# Patient Record
Sex: Female | Born: 1945
Health system: Southern US, Community
[De-identification: ages and names within clinical notes are randomized; demographics above are authoritative.]

## PROBLEM LIST (undated history)

## (undated) DIAGNOSIS — E079 Disorder of thyroid, unspecified: Secondary | ICD-10-CM

## (undated) DIAGNOSIS — K219 Gastro-esophageal reflux disease without esophagitis: Secondary | ICD-10-CM

## (undated) DIAGNOSIS — J449 Chronic obstructive pulmonary disease, unspecified: Secondary | ICD-10-CM

## (undated) DIAGNOSIS — Z972 Presence of dental prosthetic device (complete) (partial): Secondary | ICD-10-CM

## (undated) DIAGNOSIS — F32A Depression, unspecified: Secondary | ICD-10-CM

## (undated) DIAGNOSIS — M810 Age-related osteoporosis without current pathological fracture: Secondary | ICD-10-CM

## (undated) DIAGNOSIS — G2581 Restless legs syndrome: Secondary | ICD-10-CM

## (undated) DIAGNOSIS — J189 Pneumonia, unspecified organism: Secondary | ICD-10-CM

## (undated) DIAGNOSIS — F329 Major depressive disorder, single episode, unspecified: Secondary | ICD-10-CM

## (undated) DIAGNOSIS — M51369 Other intervertebral disc degeneration, lumbar region without mention of lumbar back pain or lower extremity pain: Secondary | ICD-10-CM

## (undated) DIAGNOSIS — T7840XA Allergy, unspecified, initial encounter: Secondary | ICD-10-CM

## (undated) DIAGNOSIS — B029 Zoster without complications: Secondary | ICD-10-CM

## (undated) DIAGNOSIS — Z8489 Family history of other specified conditions: Secondary | ICD-10-CM

## (undated) DIAGNOSIS — Z974 Presence of external hearing-aid: Secondary | ICD-10-CM

## (undated) DIAGNOSIS — R011 Cardiac murmur, unspecified: Secondary | ICD-10-CM

## (undated) DIAGNOSIS — M549 Dorsalgia, unspecified: Secondary | ICD-10-CM

## (undated) DIAGNOSIS — Z8379 Family history of other diseases of the digestive system: Secondary | ICD-10-CM

## (undated) DIAGNOSIS — R519 Headache, unspecified: Secondary | ICD-10-CM

## (undated) DIAGNOSIS — M419 Scoliosis, unspecified: Secondary | ICD-10-CM

## (undated) DIAGNOSIS — F419 Anxiety disorder, unspecified: Secondary | ICD-10-CM

## (undated) DIAGNOSIS — J40 Bronchitis, not specified as acute or chronic: Secondary | ICD-10-CM

## (undated) DIAGNOSIS — M436 Torticollis: Secondary | ICD-10-CM

## (undated) DIAGNOSIS — M5136 Other intervertebral disc degeneration, lumbar region: Secondary | ICD-10-CM

## (undated) DIAGNOSIS — Z90710 Acquired absence of both cervix and uterus: Secondary | ICD-10-CM

## (undated) DIAGNOSIS — I10 Essential (primary) hypertension: Secondary | ICD-10-CM

## (undated) DIAGNOSIS — J45909 Unspecified asthma, uncomplicated: Secondary | ICD-10-CM

## (undated) DIAGNOSIS — R51 Headache: Secondary | ICD-10-CM

## (undated) DIAGNOSIS — Z993 Dependence on wheelchair: Secondary | ICD-10-CM

## (undated) HISTORY — PX: BACK SURGERY: SHX140

## (undated) HISTORY — DX: Bronchitis, not specified as acute or chronic: J40

## (undated) HISTORY — DX: Dorsalgia, unspecified: M54.9

## (undated) HISTORY — PX: APPENDECTOMY: SHX54

## (undated) HISTORY — PX: CHOLECYSTECTOMY: SHX55

## (undated) HISTORY — DX: Cardiac murmur, unspecified: R01.1

## (undated) HISTORY — DX: Family history of other diseases of the digestive system: Z83.79

## (undated) HISTORY — DX: Acquired absence of both cervix and uterus: Z90.710

## (undated) HISTORY — PX: SHOULDER SURGERY: SHX246

## (undated) HISTORY — DX: Allergy, unspecified, initial encounter: T78.40XA

## (undated) HISTORY — DX: Essential (primary) hypertension: I10

## (undated) HISTORY — DX: Age-related osteoporosis without current pathological fracture: M81.0

## (undated) HISTORY — DX: Scoliosis, unspecified: M41.9

## (undated) HISTORY — DX: Disorder of thyroid, unspecified: E07.9

## (undated) HISTORY — PX: ROTATOR CUFF REPAIR: SHX139

## (undated) HISTORY — PX: NECK SURGERY: SHX720

## (undated) HISTORY — PX: ABDOMINAL HYSTERECTOMY: SHX81

## (undated) HISTORY — DX: Unspecified asthma, uncomplicated: J45.909

## (undated) HISTORY — DX: Other intervertebral disc degeneration, lumbar region without mention of lumbar back pain or lower extremity pain: M51.369

## (undated) HISTORY — DX: Pneumonia, unspecified organism: J18.9

## (undated) HISTORY — DX: Zoster without complications: B02.9

## (undated) HISTORY — DX: Other intervertebral disc degeneration, lumbar region: M51.36

## (undated) HISTORY — DX: Gastro-esophageal reflux disease without esophagitis: K21.9

## (undated) HISTORY — PX: SHOULDER OPEN ROTATOR CUFF REPAIR: SHX2407

## (undated) HISTORY — DX: Chronic obstructive pulmonary disease, unspecified: J44.9

---

## 1996-10-30 HISTORY — PX: JOINT REPLACEMENT: SHX530

## 2002-09-30 ENCOUNTER — Encounter: Payer: Self-pay | Admitting: Neurosurgery

## 2002-09-30 ENCOUNTER — Encounter: Admission: RE | Admit: 2002-09-30 | Discharge: 2002-09-30 | Payer: Self-pay | Admitting: Neurosurgery

## 2004-08-11 ENCOUNTER — Ambulatory Visit: Payer: Self-pay | Admitting: Pain Medicine

## 2004-08-15 ENCOUNTER — Ambulatory Visit: Payer: Self-pay | Admitting: Pain Medicine

## 2004-09-06 ENCOUNTER — Ambulatory Visit: Payer: Self-pay | Admitting: Pain Medicine

## 2004-09-14 ENCOUNTER — Ambulatory Visit: Payer: Self-pay | Admitting: Pain Medicine

## 2004-10-04 ENCOUNTER — Ambulatory Visit: Payer: Self-pay | Admitting: Pain Medicine

## 2004-10-12 ENCOUNTER — Ambulatory Visit: Payer: Self-pay | Admitting: Pain Medicine

## 2004-11-03 ENCOUNTER — Ambulatory Visit: Payer: Self-pay | Admitting: Pain Medicine

## 2004-11-07 ENCOUNTER — Ambulatory Visit: Payer: Self-pay | Admitting: Pain Medicine

## 2004-11-29 ENCOUNTER — Ambulatory Visit: Payer: Self-pay | Admitting: Pain Medicine

## 2004-12-07 ENCOUNTER — Ambulatory Visit: Payer: Self-pay | Admitting: Pain Medicine

## 2004-12-12 ENCOUNTER — Ambulatory Visit: Payer: Self-pay | Admitting: Pain Medicine

## 2004-12-20 ENCOUNTER — Ambulatory Visit: Payer: Self-pay | Admitting: Family Medicine

## 2004-12-22 ENCOUNTER — Ambulatory Visit: Payer: Self-pay | Admitting: Pain Medicine

## 2004-12-28 ENCOUNTER — Ambulatory Visit: Payer: Self-pay | Admitting: Pain Medicine

## 2005-01-01 ENCOUNTER — Emergency Department: Payer: Self-pay | Admitting: Internal Medicine

## 2005-01-12 ENCOUNTER — Inpatient Hospital Stay (HOSPITAL_COMMUNITY): Admission: RE | Admit: 2005-01-12 | Discharge: 2005-01-13 | Payer: Self-pay | Admitting: Gynecology

## 2005-02-21 ENCOUNTER — Ambulatory Visit: Payer: Self-pay | Admitting: Pain Medicine

## 2005-02-21 ENCOUNTER — Emergency Department: Payer: Self-pay | Admitting: Emergency Medicine

## 2005-02-24 ENCOUNTER — Ambulatory Visit: Payer: Self-pay | Admitting: Physician Assistant

## 2005-03-08 ENCOUNTER — Ambulatory Visit: Payer: Self-pay | Admitting: Pain Medicine

## 2005-03-08 ENCOUNTER — Other Ambulatory Visit: Payer: Self-pay

## 2005-03-10 ENCOUNTER — Ambulatory Visit: Payer: Self-pay | Admitting: Unknown Physician Specialty

## 2005-03-21 ENCOUNTER — Ambulatory Visit: Payer: Self-pay | Admitting: Pain Medicine

## 2005-03-28 ENCOUNTER — Encounter: Payer: Self-pay | Admitting: Unknown Physician Specialty

## 2005-03-30 ENCOUNTER — Encounter: Payer: Self-pay | Admitting: Unknown Physician Specialty

## 2005-04-18 ENCOUNTER — Ambulatory Visit: Payer: Self-pay | Admitting: Pain Medicine

## 2005-04-24 ENCOUNTER — Ambulatory Visit: Payer: Self-pay | Admitting: Pain Medicine

## 2005-04-29 ENCOUNTER — Encounter: Payer: Self-pay | Admitting: Unknown Physician Specialty

## 2005-10-17 ENCOUNTER — Ambulatory Visit: Payer: Self-pay | Admitting: Family Medicine

## 2006-02-28 ENCOUNTER — Other Ambulatory Visit: Payer: Self-pay

## 2006-03-05 ENCOUNTER — Ambulatory Visit: Payer: Self-pay | Admitting: General Surgery

## 2006-07-11 ENCOUNTER — Emergency Department: Payer: Self-pay | Admitting: Emergency Medicine

## 2006-08-27 ENCOUNTER — Emergency Department: Payer: Self-pay | Admitting: Emergency Medicine

## 2006-11-04 ENCOUNTER — Emergency Department: Payer: Self-pay | Admitting: General Practice

## 2006-12-11 ENCOUNTER — Emergency Department: Payer: Self-pay | Admitting: Internal Medicine

## 2006-12-25 ENCOUNTER — Other Ambulatory Visit: Payer: Self-pay

## 2006-12-25 ENCOUNTER — Inpatient Hospital Stay: Payer: Self-pay | Admitting: Internal Medicine

## 2006-12-26 ENCOUNTER — Other Ambulatory Visit: Payer: Self-pay

## 2007-04-25 ENCOUNTER — Emergency Department: Payer: Self-pay | Admitting: Emergency Medicine

## 2007-05-15 ENCOUNTER — Ambulatory Visit: Payer: Self-pay | Admitting: Physician Assistant

## 2007-06-05 ENCOUNTER — Emergency Department: Payer: Self-pay | Admitting: Emergency Medicine

## 2007-07-23 ENCOUNTER — Encounter: Payer: Self-pay | Admitting: Internal Medicine

## 2007-07-31 ENCOUNTER — Encounter: Payer: Self-pay | Admitting: Internal Medicine

## 2007-07-31 ENCOUNTER — Ambulatory Visit: Payer: Self-pay | Admitting: Pain Medicine

## 2007-08-14 ENCOUNTER — Ambulatory Visit: Payer: Self-pay | Admitting: Pain Medicine

## 2007-08-31 ENCOUNTER — Encounter: Payer: Self-pay | Admitting: Internal Medicine

## 2007-09-12 ENCOUNTER — Ambulatory Visit: Payer: Self-pay | Admitting: Pain Medicine

## 2007-09-18 ENCOUNTER — Ambulatory Visit: Payer: Self-pay | Admitting: Pain Medicine

## 2007-10-10 ENCOUNTER — Ambulatory Visit: Payer: Self-pay | Admitting: Pain Medicine

## 2007-10-15 ENCOUNTER — Ambulatory Visit: Payer: Self-pay | Admitting: Unknown Physician Specialty

## 2007-10-21 ENCOUNTER — Ambulatory Visit: Payer: Self-pay | Admitting: Pain Medicine

## 2007-11-06 ENCOUNTER — Ambulatory Visit: Payer: Self-pay | Admitting: Pain Medicine

## 2007-11-20 ENCOUNTER — Ambulatory Visit: Payer: Self-pay | Admitting: Pain Medicine

## 2007-11-21 ENCOUNTER — Other Ambulatory Visit: Payer: Self-pay

## 2007-11-21 ENCOUNTER — Ambulatory Visit: Payer: Self-pay | Admitting: Unknown Physician Specialty

## 2007-11-28 ENCOUNTER — Ambulatory Visit: Payer: Self-pay | Admitting: Unknown Physician Specialty

## 2007-12-26 ENCOUNTER — Ambulatory Visit: Payer: Self-pay | Admitting: Pain Medicine

## 2008-01-01 ENCOUNTER — Ambulatory Visit: Payer: Self-pay | Admitting: Pain Medicine

## 2008-01-21 ENCOUNTER — Ambulatory Visit: Payer: Self-pay | Admitting: Pain Medicine

## 2008-01-27 ENCOUNTER — Ambulatory Visit: Payer: Self-pay | Admitting: Pain Medicine

## 2008-03-12 ENCOUNTER — Ambulatory Visit: Payer: Self-pay | Admitting: Pain Medicine

## 2008-03-18 ENCOUNTER — Ambulatory Visit: Payer: Self-pay | Admitting: Pain Medicine

## 2008-04-21 ENCOUNTER — Ambulatory Visit: Payer: Self-pay | Admitting: Pain Medicine

## 2008-04-22 ENCOUNTER — Ambulatory Visit: Payer: Self-pay | Admitting: Internal Medicine

## 2008-04-27 ENCOUNTER — Ambulatory Visit: Payer: Self-pay | Admitting: Pain Medicine

## 2008-05-28 ENCOUNTER — Ambulatory Visit: Payer: Self-pay | Admitting: Pain Medicine

## 2008-06-01 ENCOUNTER — Ambulatory Visit: Payer: Self-pay | Admitting: Pain Medicine

## 2008-06-23 ENCOUNTER — Ambulatory Visit: Payer: Self-pay | Admitting: Pain Medicine

## 2008-07-01 ENCOUNTER — Ambulatory Visit: Payer: Self-pay | Admitting: Pain Medicine

## 2008-07-09 ENCOUNTER — Emergency Department: Payer: Self-pay | Admitting: Emergency Medicine

## 2008-07-13 ENCOUNTER — Ambulatory Visit: Payer: Self-pay | Admitting: Pain Medicine

## 2008-07-28 ENCOUNTER — Ambulatory Visit: Payer: Self-pay | Admitting: Pain Medicine

## 2008-08-03 ENCOUNTER — Ambulatory Visit: Payer: Self-pay | Admitting: Pain Medicine

## 2008-08-27 ENCOUNTER — Ambulatory Visit: Payer: Self-pay | Admitting: Pain Medicine

## 2008-09-02 ENCOUNTER — Ambulatory Visit: Payer: Self-pay | Admitting: Pain Medicine

## 2008-10-15 ENCOUNTER — Ambulatory Visit: Payer: Self-pay | Admitting: Pain Medicine

## 2008-11-11 ENCOUNTER — Ambulatory Visit: Payer: Self-pay | Admitting: Pain Medicine

## 2008-12-15 ENCOUNTER — Ambulatory Visit: Payer: Self-pay | Admitting: Pain Medicine

## 2008-12-21 ENCOUNTER — Ambulatory Visit: Payer: Self-pay | Admitting: Pain Medicine

## 2009-01-14 ENCOUNTER — Ambulatory Visit: Payer: Self-pay | Admitting: Pain Medicine

## 2009-01-20 ENCOUNTER — Ambulatory Visit: Payer: Self-pay | Admitting: Pain Medicine

## 2009-02-09 ENCOUNTER — Ambulatory Visit: Payer: Self-pay | Admitting: Pain Medicine

## 2009-02-10 ENCOUNTER — Ambulatory Visit: Payer: Self-pay | Admitting: Unknown Physician Specialty

## 2009-02-22 ENCOUNTER — Ambulatory Visit: Payer: Self-pay | Admitting: Pain Medicine

## 2009-03-03 ENCOUNTER — Ambulatory Visit: Payer: Self-pay | Admitting: Pain Medicine

## 2009-03-17 ENCOUNTER — Ambulatory Visit: Payer: Self-pay | Admitting: Pain Medicine

## 2009-03-29 ENCOUNTER — Inpatient Hospital Stay: Payer: Self-pay | Admitting: Internal Medicine

## 2009-04-06 ENCOUNTER — Ambulatory Visit: Payer: Self-pay | Admitting: Pain Medicine

## 2009-04-26 ENCOUNTER — Ambulatory Visit: Payer: Self-pay | Admitting: Pain Medicine

## 2009-05-02 ENCOUNTER — Emergency Department: Payer: Self-pay | Admitting: Emergency Medicine

## 2009-06-01 ENCOUNTER — Ambulatory Visit: Payer: Self-pay | Admitting: Pain Medicine

## 2009-06-16 ENCOUNTER — Ambulatory Visit: Payer: Self-pay | Admitting: Pain Medicine

## 2009-07-01 ENCOUNTER — Ambulatory Visit: Payer: Self-pay | Admitting: Pain Medicine

## 2009-08-23 ENCOUNTER — Ambulatory Visit: Payer: Self-pay

## 2009-08-31 ENCOUNTER — Ambulatory Visit: Payer: Self-pay | Admitting: Pain Medicine

## 2009-09-06 ENCOUNTER — Ambulatory Visit: Payer: Self-pay | Admitting: Pain Medicine

## 2009-09-21 ENCOUNTER — Ambulatory Visit: Payer: Self-pay | Admitting: Unknown Physician Specialty

## 2009-09-28 ENCOUNTER — Inpatient Hospital Stay: Payer: Self-pay | Admitting: Unknown Physician Specialty

## 2009-12-07 ENCOUNTER — Ambulatory Visit: Payer: Self-pay | Admitting: Pain Medicine

## 2010-08-07 ENCOUNTER — Inpatient Hospital Stay: Payer: Self-pay | Admitting: Internal Medicine

## 2011-03-18 ENCOUNTER — Emergency Department: Payer: Self-pay | Admitting: Unknown Physician Specialty

## 2011-05-20 ENCOUNTER — Emergency Department: Payer: Self-pay | Admitting: Emergency Medicine

## 2011-05-29 ENCOUNTER — Emergency Department: Payer: Self-pay | Admitting: Unknown Physician Specialty

## 2012-01-07 ENCOUNTER — Emergency Department: Payer: Self-pay | Admitting: Emergency Medicine

## 2012-01-07 LAB — MAGNESIUM: Magnesium: 2 mg/dL

## 2012-01-07 LAB — COMPREHENSIVE METABOLIC PANEL
Albumin: 3.9 g/dL (ref 3.4–5.0)
Alkaline Phosphatase: 84 U/L (ref 50–136)
Anion Gap: 8 (ref 7–16)
BUN: 11 mg/dL (ref 7–18)
Bilirubin,Total: 0.4 mg/dL (ref 0.2–1.0)
Calcium, Total: 9.5 mg/dL (ref 8.5–10.1)
Chloride: 100 mmol/L (ref 98–107)
Co2: 29 mmol/L (ref 21–32)
Creatinine: 0.79 mg/dL (ref 0.60–1.30)
EGFR (African American): >60
EGFR (Non-African Amer.): >60
Glucose: 97 mg/dL (ref 65–99)
Osmolality: 273 (ref 275–301)
Potassium: 3.2 mmol/L — ABNORMAL LOW (ref 3.5–5.1)
SGOT(AST): 38 U/L — ABNORMAL HIGH (ref 15–37)
SGPT (ALT): 40 U/L
Sodium: 137 mmol/L (ref 136–145)
Total Protein: 7.7 g/dL (ref 6.4–8.2)

## 2012-01-11 ENCOUNTER — Ambulatory Visit: Payer: Self-pay | Admitting: Pain Medicine

## 2012-01-12 ENCOUNTER — Ambulatory Visit: Payer: Self-pay | Admitting: Internal Medicine

## 2012-01-24 ENCOUNTER — Ambulatory Visit: Payer: Self-pay | Admitting: Pain Medicine

## 2012-02-29 ENCOUNTER — Ambulatory Visit: Payer: Self-pay | Admitting: Pain Medicine

## 2012-03-11 ENCOUNTER — Ambulatory Visit: Payer: Self-pay | Admitting: Pain Medicine

## 2012-05-07 ENCOUNTER — Ambulatory Visit: Payer: Self-pay | Admitting: Orthopedic Surgery

## 2012-06-24 ENCOUNTER — Ambulatory Visit: Payer: Self-pay | Admitting: Neurology

## 2012-07-04 ENCOUNTER — Emergency Department: Payer: Self-pay | Admitting: Emergency Medicine

## 2012-07-04 ENCOUNTER — Inpatient Hospital Stay: Payer: Self-pay | Admitting: Internal Medicine

## 2012-07-04 LAB — CBC WITH DIFFERENTIAL/PLATELET
Basophil #: 0.1 10*3/uL (ref 0.0–0.1)
Basophil %: 0.6 %
Eosinophil #: 0.1 10*3/uL (ref 0.0–0.7)
Eosinophil %: 0.9 %
HCT: 45.9 % (ref 35.0–47.0)
HGB: 15.4 g/dL (ref 12.0–16.0)
Lymphocyte #: 2.6 10*3/uL (ref 1.0–3.6)
Lymphocyte %: 19.4 %
MCH: 30.8 pg (ref 26.0–34.0)
MCHC: 33.5 g/dL (ref 32.0–36.0)
MCV: 92 fL (ref 80–100)
Monocyte #: 1.2 x10 3/mm — ABNORMAL HIGH (ref 0.2–0.9)
Monocyte %: 8.9 %
Neutrophil #: 9.3 10*3/uL — ABNORMAL HIGH (ref 1.4–6.5)
Neutrophil %: 70.2 %
Platelet: 195 10*3/uL (ref 150–440)
RBC: 5 10*6/uL (ref 3.80–5.20)
RDW: 14.2 % (ref 11.5–14.5)
WBC: 13.3 10*3/uL — ABNORMAL HIGH (ref 3.6–11.0)

## 2012-07-04 LAB — TROPONIN I: Troponin-I: <0.02 ng/mL

## 2012-07-04 LAB — URINALYSIS, COMPLETE
Bacteria: NONE SEEN
Bilirubin,UR: NEGATIVE
Glucose,UR: NEGATIVE mg/dL (ref 0–75)
Ketone: NEGATIVE
Leukocyte Esterase: NEGATIVE
Nitrite: NEGATIVE
Ph: 6 (ref 4.5–8.0)
Protein: NEGATIVE
RBC,UR: 1 /HPF (ref 0–5)
Specific Gravity: 1.004 (ref 1.003–1.030)
Squamous Epithelial: 1
WBC UR: <1 /HPF (ref 0–5)

## 2012-07-04 LAB — BASIC METABOLIC PANEL
Anion Gap: 5 — ABNORMAL LOW (ref 7–16)
BUN: 10 mg/dL (ref 7–18)
Calcium, Total: 9.1 mg/dL (ref 8.5–10.1)
Chloride: 104 mmol/L (ref 98–107)
Co2: 26 mmol/L (ref 21–32)
Creatinine: 0.62 mg/dL (ref 0.60–1.30)
EGFR (African American): >60
EGFR (Non-African Amer.): >60
Glucose: 81 mg/dL (ref 65–99)
Osmolality: 268 (ref 275–301)
Potassium: 3.8 mmol/L (ref 3.5–5.1)
Sodium: 135 mmol/L — ABNORMAL LOW (ref 136–145)

## 2012-07-04 LAB — CK TOTAL AND CKMB (NOT AT ARMC)
CK, Total: 107 U/L (ref 21–215)
CK-MB: 2 ng/mL (ref 0.5–3.6)

## 2012-07-08 LAB — CREATININE, SERUM
Creatinine: 0.79 mg/dL (ref 0.60–1.30)
EGFR (African American): >60
EGFR (Non-African Amer.): >60

## 2012-07-16 ENCOUNTER — Ambulatory Visit: Payer: Self-pay | Admitting: Pain Medicine

## 2012-07-17 ENCOUNTER — Emergency Department: Payer: Self-pay | Admitting: Emergency Medicine

## 2012-07-17 LAB — BASIC METABOLIC PANEL
Anion Gap: 11 (ref 7–16)
BUN: 16 mg/dL (ref 7–18)
Calcium, Total: 9.4 mg/dL (ref 8.5–10.1)
Chloride: 100 mmol/L (ref 98–107)
Co2: 26 mmol/L (ref 21–32)
Creatinine: 1.03 mg/dL (ref 0.60–1.30)
EGFR (African American): >60
EGFR (Non-African Amer.): 57 — ABNORMAL LOW
Glucose: 197 mg/dL — ABNORMAL HIGH (ref 65–99)
Osmolality: 280 (ref 275–301)
Potassium: 3.7 mmol/L (ref 3.5–5.1)
Sodium: 137 mmol/L (ref 136–145)

## 2012-07-17 LAB — CK TOTAL AND CKMB (NOT AT ARMC)
CK, Total: 43 U/L (ref 21–215)
CK-MB: 1.1 ng/mL (ref 0.5–3.6)

## 2012-07-17 LAB — CBC
HCT: 43.6 % (ref 35.0–47.0)
HGB: 14.9 g/dL (ref 12.0–16.0)
MCH: 31 pg (ref 26.0–34.0)
MCHC: 34.1 g/dL (ref 32.0–36.0)
MCV: 91 fL (ref 80–100)
Platelet: 323 10*3/uL (ref 150–440)
RBC: 4.8 10*6/uL (ref 3.80–5.20)
RDW: 14.3 % (ref 11.5–14.5)
WBC: 17.8 10*3/uL — ABNORMAL HIGH (ref 3.6–11.0)

## 2012-07-17 LAB — TROPONIN I
Troponin-I: <0.02 ng/mL
Troponin-I: <0.02 ng/mL

## 2012-07-18 ENCOUNTER — Emergency Department: Payer: Self-pay | Admitting: Emergency Medicine

## 2012-07-29 ENCOUNTER — Ambulatory Visit: Payer: Self-pay | Admitting: Pain Medicine

## 2012-08-22 ENCOUNTER — Ambulatory Visit: Payer: Self-pay | Admitting: Otolaryngology

## 2012-10-15 ENCOUNTER — Ambulatory Visit: Payer: Self-pay | Admitting: Pain Medicine

## 2012-10-28 ENCOUNTER — Ambulatory Visit: Payer: Self-pay | Admitting: Pain Medicine

## 2012-11-21 ENCOUNTER — Ambulatory Visit: Payer: Self-pay | Admitting: Pain Medicine

## 2012-12-04 ENCOUNTER — Ambulatory Visit: Payer: Self-pay | Admitting: Pain Medicine

## 2012-12-24 ENCOUNTER — Ambulatory Visit: Payer: Self-pay | Admitting: Pain Medicine

## 2013-01-01 ENCOUNTER — Ambulatory Visit: Payer: Self-pay | Admitting: Pain Medicine

## 2013-01-14 ENCOUNTER — Ambulatory Visit: Payer: Self-pay | Admitting: Internal Medicine

## 2013-01-30 ENCOUNTER — Ambulatory Visit: Payer: Self-pay | Admitting: Pain Medicine

## 2013-02-12 ENCOUNTER — Ambulatory Visit: Payer: Self-pay | Admitting: Pain Medicine

## 2013-02-25 ENCOUNTER — Ambulatory Visit: Payer: Self-pay | Admitting: Pain Medicine

## 2013-02-26 LAB — BASIC METABOLIC PANEL
Anion Gap: 4 — ABNORMAL LOW (ref 7–16)
BUN: 7 mg/dL (ref 7–18)
Calcium, Total: 9.1 mg/dL (ref 8.5–10.1)
Chloride: 101 mmol/L (ref 98–107)
Co2: 30 mmol/L (ref 21–32)
Creatinine: 0.64 mg/dL (ref 0.60–1.30)
EGFR (African American): >60
EGFR (Non-African Amer.): >60
Glucose: 136 mg/dL — ABNORMAL HIGH (ref 65–99)
Osmolality: 270 (ref 275–301)
Potassium: 3.6 mmol/L (ref 3.5–5.1)
Sodium: 135 mmol/L — ABNORMAL LOW (ref 136–145)

## 2013-02-26 LAB — CBC
HCT: 40.9 % (ref 35.0–47.0)
HGB: 13.8 g/dL (ref 12.0–16.0)
MCH: 29.9 pg (ref 26.0–34.0)
MCHC: 33.8 g/dL (ref 32.0–36.0)
MCV: 89 fL (ref 80–100)
Platelet: 259 10*3/uL (ref 150–440)
RBC: 4.62 10*6/uL (ref 3.80–5.20)
RDW: 14.3 % (ref 11.5–14.5)
WBC: 17.2 10*3/uL — ABNORMAL HIGH (ref 3.6–11.0)

## 2013-02-27 ENCOUNTER — Inpatient Hospital Stay: Payer: Self-pay | Admitting: Family Medicine

## 2013-02-27 LAB — CBC WITH DIFFERENTIAL/PLATELET
Basophil #: 0.1 10*3/uL (ref 0.0–0.1)
Basophil %: 0.3 %
Eosinophil #: 0 10*3/uL (ref 0.0–0.7)
Eosinophil %: 0 %
HCT: 39.8 % (ref 35.0–47.0)
HGB: 13.5 g/dL (ref 12.0–16.0)
Lymphocyte #: 0.6 10*3/uL — ABNORMAL LOW (ref 1.0–3.6)
Lymphocyte %: 4.2 %
MCH: 30 pg (ref 26.0–34.0)
MCHC: 33.8 g/dL (ref 32.0–36.0)
MCV: 89 fL (ref 80–100)
Monocyte #: 0.1 x10 3/mm — ABNORMAL LOW (ref 0.2–0.9)
Monocyte %: 0.6 %
Neutrophil #: 14.7 10*3/uL — ABNORMAL HIGH (ref 1.4–6.5)
Neutrophil %: 94.9 %
Platelet: 251 10*3/uL (ref 150–440)
RBC: 4.48 10*6/uL (ref 3.80–5.20)
RDW: 14.4 % (ref 11.5–14.5)
WBC: 15.5 10*3/uL — ABNORMAL HIGH (ref 3.6–11.0)

## 2013-02-27 LAB — BASIC METABOLIC PANEL
Anion Gap: 10 (ref 7–16)
BUN: 8 mg/dL (ref 7–18)
Calcium, Total: 8.8 mg/dL (ref 8.5–10.1)
Chloride: 97 mmol/L — ABNORMAL LOW (ref 98–107)
Co2: 26 mmol/L (ref 21–32)
Creatinine: 1.05 mg/dL (ref 0.60–1.30)
EGFR (African American): >60
EGFR (Non-African Amer.): 56 — ABNORMAL LOW
Glucose: 204 mg/dL — ABNORMAL HIGH (ref 65–99)
Osmolality: 271 (ref 275–301)
Potassium: 3.5 mmol/L (ref 3.5–5.1)
Sodium: 133 mmol/L — ABNORMAL LOW (ref 136–145)

## 2013-03-02 LAB — CREATININE, SERUM
Creatinine: 0.68 mg/dL (ref 0.60–1.30)
EGFR (African American): >60
EGFR (Non-African Amer.): >60

## 2013-03-02 LAB — CBC WITH DIFFERENTIAL/PLATELET
Basophil #: 0 10*3/uL (ref 0.0–0.1)
Basophil %: 0.2 %
Eosinophil #: 0 10*3/uL (ref 0.0–0.7)
Eosinophil %: 0 %
HCT: 37.1 % (ref 35.0–47.0)
HGB: 12.6 g/dL (ref 12.0–16.0)
Lymphocyte #: 1.6 10*3/uL (ref 1.0–3.6)
Lymphocyte %: 10.7 %
MCH: 29.8 pg (ref 26.0–34.0)
MCHC: 34.1 g/dL (ref 32.0–36.0)
MCV: 88 fL (ref 80–100)
Monocyte #: 1.2 x10 3/mm — ABNORMAL HIGH (ref 0.2–0.9)
Monocyte %: 7.9 %
Neutrophil #: 12 10*3/uL — ABNORMAL HIGH (ref 1.4–6.5)
Neutrophil %: 81.2 %
Platelet: 224 10*3/uL (ref 150–440)
RBC: 4.24 10*6/uL (ref 3.80–5.20)
RDW: 14.1 % (ref 11.5–14.5)
WBC: 14.7 10*3/uL — ABNORMAL HIGH (ref 3.6–11.0)

## 2013-03-04 LAB — CULTURE, BLOOD (SINGLE)

## 2013-03-10 ENCOUNTER — Ambulatory Visit: Payer: Self-pay | Admitting: Pain Medicine

## 2013-04-23 ENCOUNTER — Ambulatory Visit: Payer: Self-pay | Admitting: Pain Medicine

## 2013-04-30 ENCOUNTER — Ambulatory Visit: Payer: Self-pay | Admitting: Pain Medicine

## 2013-05-22 ENCOUNTER — Ambulatory Visit: Payer: Self-pay | Admitting: Pain Medicine

## 2013-06-09 ENCOUNTER — Ambulatory Visit: Payer: Self-pay | Admitting: Pain Medicine

## 2013-06-25 ENCOUNTER — Ambulatory Visit: Payer: Self-pay | Admitting: Pain Medicine

## 2013-07-07 ENCOUNTER — Ambulatory Visit: Payer: Self-pay | Admitting: Pain Medicine

## 2013-07-24 ENCOUNTER — Ambulatory Visit: Payer: Self-pay | Admitting: Pain Medicine

## 2013-08-21 ENCOUNTER — Ambulatory Visit: Payer: Self-pay | Admitting: Pain Medicine

## 2013-09-01 ENCOUNTER — Ambulatory Visit: Payer: Self-pay | Admitting: Pain Medicine

## 2013-09-22 ENCOUNTER — Emergency Department: Payer: Self-pay | Admitting: Emergency Medicine

## 2013-10-07 ENCOUNTER — Ambulatory Visit: Payer: Self-pay | Admitting: Pain Medicine

## 2013-10-13 ENCOUNTER — Ambulatory Visit: Payer: Self-pay | Admitting: Pain Medicine

## 2013-10-28 ENCOUNTER — Ambulatory Visit: Payer: Self-pay | Admitting: Pain Medicine

## 2013-11-24 ENCOUNTER — Ambulatory Visit: Payer: Self-pay | Admitting: Pain Medicine

## 2013-12-30 ENCOUNTER — Ambulatory Visit: Payer: Self-pay | Admitting: Pain Medicine

## 2014-01-28 ENCOUNTER — Ambulatory Visit: Payer: Self-pay | Admitting: Pain Medicine

## 2014-02-09 ENCOUNTER — Ambulatory Visit: Payer: Self-pay

## 2014-02-09 LAB — CBC WITH DIFFERENTIAL/PLATELET
Basophil #: 0.1 10*3/uL (ref 0.0–0.1)
Basophil %: 1.1 %
Eosinophil #: 0.2 10*3/uL (ref 0.0–0.7)
Eosinophil %: 2.1 %
HCT: 43.3 % (ref 35.0–47.0)
HGB: 14.1 g/dL (ref 12.0–16.0)
Lymphocyte #: 2 10*3/uL (ref 1.0–3.6)
Lymphocyte %: 20.9 %
MCH: 28.9 pg (ref 26.0–34.0)
MCHC: 32.5 g/dL (ref 32.0–36.0)
MCV: 89 fL (ref 80–100)
Monocyte #: 0.7 x10 3/mm (ref 0.2–0.9)
Monocyte %: 7.3 %
Neutrophil #: 6.4 10*3/uL (ref 1.4–6.5)
Neutrophil %: 68.6 %
Platelet: 253 10*3/uL (ref 150–440)
RBC: 4.88 10*6/uL (ref 3.80–5.20)
RDW: 15 % — ABNORMAL HIGH (ref 11.5–14.5)
WBC: 9.4 10*3/uL (ref 3.6–11.0)

## 2014-02-09 LAB — COMPREHENSIVE METABOLIC PANEL
Albumin: 3.6 g/dL (ref 3.4–5.0)
Alkaline Phosphatase: 106 U/L
Anion Gap: 9 (ref 7–16)
BUN: 13 mg/dL (ref 7–18)
Bilirubin,Total: 0.3 mg/dL (ref 0.2–1.0)
Calcium, Total: 9 mg/dL (ref 8.5–10.1)
Chloride: 100 mmol/L (ref 98–107)
Co2: 26 mmol/L (ref 21–32)
Creatinine: 0.75 mg/dL (ref 0.60–1.30)
EGFR (African American): >60
EGFR (Non-African Amer.): >60
Glucose: 81 mg/dL (ref 65–99)
Osmolality: 269 (ref 275–301)
Potassium: 4.1 mmol/L (ref 3.5–5.1)
SGOT(AST): 21 U/L (ref 15–37)
SGPT (ALT): 25 U/L (ref 12–78)
Sodium: 135 mmol/L — ABNORMAL LOW (ref 136–145)
Total Protein: 7.1 g/dL (ref 6.4–8.2)

## 2014-02-09 LAB — MAGNESIUM: Magnesium: 2.2 mg/dL

## 2014-02-26 ENCOUNTER — Ambulatory Visit: Payer: Self-pay | Admitting: Pain Medicine

## 2014-03-03 ENCOUNTER — Ambulatory Visit: Payer: Self-pay | Admitting: Internal Medicine

## 2014-03-05 ENCOUNTER — Emergency Department: Payer: Self-pay | Admitting: Emergency Medicine

## 2014-03-05 LAB — BASIC METABOLIC PANEL
Anion Gap: 2 — ABNORMAL LOW (ref 7–16)
BUN: 8 mg/dL (ref 7–18)
Calcium, Total: 9.1 mg/dL (ref 8.5–10.1)
Chloride: 101 mmol/L (ref 98–107)
Co2: 31 mmol/L (ref 21–32)
Creatinine: 0.68 mg/dL (ref 0.60–1.30)
EGFR (African American): >60
EGFR (Non-African Amer.): >60
Glucose: 94 mg/dL (ref 65–99)
Osmolality: 266 (ref 275–301)
Potassium: 4.9 mmol/L (ref 3.5–5.1)
Sodium: 134 mmol/L — ABNORMAL LOW (ref 136–145)

## 2014-03-05 LAB — CBC
HCT: 41.7 % (ref 35.0–47.0)
HGB: 13.8 g/dL (ref 12.0–16.0)
MCH: 29.4 pg (ref 26.0–34.0)
MCHC: 33.1 g/dL (ref 32.0–36.0)
MCV: 89 fL (ref 80–100)
Platelet: 259 10*3/uL (ref 150–440)
RBC: 4.7 10*6/uL (ref 3.80–5.20)
RDW: 15.4 % — ABNORMAL HIGH (ref 11.5–14.5)
WBC: 7 10*3/uL (ref 3.6–11.0)

## 2014-03-05 LAB — URINALYSIS, COMPLETE
Bacteria: NONE SEEN
Bilirubin,UR: NEGATIVE
Blood: NEGATIVE
Glucose,UR: NEGATIVE mg/dL (ref 0–75)
Hyaline Cast: 1
Ketone: NEGATIVE
Leukocyte Esterase: NEGATIVE
Nitrite: NEGATIVE
Ph: 6 (ref 4.5–8.0)
Protein: NEGATIVE
RBC,UR: NONE SEEN /HPF (ref 0–5)
Specific Gravity: 1.009 (ref 1.003–1.030)
Squamous Epithelial: NONE SEEN
WBC UR: NONE SEEN /HPF (ref 0–5)

## 2014-03-18 ENCOUNTER — Ambulatory Visit: Payer: Self-pay | Admitting: Pain Medicine

## 2014-03-25 ENCOUNTER — Ambulatory Visit: Payer: Self-pay | Admitting: Pain Medicine

## 2014-03-31 ENCOUNTER — Ambulatory Visit: Payer: Self-pay | Admitting: Pain Medicine

## 2014-04-20 ENCOUNTER — Ambulatory Visit: Payer: Self-pay | Admitting: Pain Medicine

## 2014-05-26 ENCOUNTER — Ambulatory Visit: Payer: Self-pay | Admitting: Pain Medicine

## 2014-06-03 ENCOUNTER — Ambulatory Visit: Payer: Self-pay | Admitting: Pain Medicine

## 2014-06-30 ENCOUNTER — Ambulatory Visit: Payer: Self-pay | Admitting: Pain Medicine

## 2014-07-13 ENCOUNTER — Ambulatory Visit: Payer: Self-pay | Admitting: Pain Medicine

## 2014-07-22 ENCOUNTER — Ambulatory Visit: Payer: Self-pay | Admitting: Pain Medicine

## 2014-08-07 ENCOUNTER — Emergency Department: Payer: Self-pay | Admitting: Internal Medicine

## 2014-08-17 ENCOUNTER — Ambulatory Visit: Payer: Self-pay | Admitting: Pain Medicine

## 2014-08-24 ENCOUNTER — Ambulatory Visit: Payer: Self-pay | Admitting: Pain Medicine

## 2014-08-26 DIAGNOSIS — J45909 Unspecified asthma, uncomplicated: Secondary | ICD-10-CM | POA: Insufficient documentation

## 2014-08-26 DIAGNOSIS — IMO0002 Reserved for concepts with insufficient information to code with codable children: Secondary | ICD-10-CM | POA: Insufficient documentation

## 2014-08-26 DIAGNOSIS — M810 Age-related osteoporosis without current pathological fracture: Secondary | ICD-10-CM | POA: Insufficient documentation

## 2014-08-26 DIAGNOSIS — M1990 Unspecified osteoarthritis, unspecified site: Secondary | ICD-10-CM | POA: Insufficient documentation

## 2014-08-26 DIAGNOSIS — E034 Atrophy of thyroid (acquired): Secondary | ICD-10-CM | POA: Insufficient documentation

## 2014-09-22 ENCOUNTER — Ambulatory Visit: Payer: Self-pay | Admitting: Pain Medicine

## 2014-09-30 ENCOUNTER — Ambulatory Visit: Payer: Self-pay | Admitting: Pain Medicine

## 2014-10-12 ENCOUNTER — Ambulatory Visit: Payer: Self-pay | Admitting: Pain Medicine

## 2014-10-28 ENCOUNTER — Ambulatory Visit: Payer: Self-pay | Admitting: Pain Medicine

## 2014-11-05 ENCOUNTER — Emergency Department: Payer: Self-pay | Admitting: Emergency Medicine

## 2014-11-05 LAB — COMPREHENSIVE METABOLIC PANEL
Albumin: 3.6 g/dL (ref 3.4–5.0)
Alkaline Phosphatase: 90 U/L
Anion Gap: 9 (ref 7–16)
BUN: 11 mg/dL (ref 7–18)
Bilirubin,Total: 0.6 mg/dL (ref 0.2–1.0)
Calcium, Total: 8.9 mg/dL (ref 8.5–10.1)
Chloride: 98 mmol/L (ref 98–107)
Co2: 24 mmol/L (ref 21–32)
Creatinine: 0.95 mg/dL (ref 0.60–1.30)
EGFR (African American): >60
EGFR (Non-African Amer.): >60
Glucose: 117 mg/dL — ABNORMAL HIGH (ref 65–99)
Osmolality: 263 (ref 275–301)
Potassium: 4 mmol/L (ref 3.5–5.1)
SGOT(AST): 18 U/L (ref 15–37)
SGPT (ALT): 25 U/L
Sodium: 131 mmol/L — ABNORMAL LOW (ref 136–145)
Total Protein: 6.8 g/dL (ref 6.4–8.2)

## 2014-11-05 LAB — CBC
HCT: 41 % (ref 35.0–47.0)
HGB: 12.9 g/dL (ref 12.0–16.0)
MCH: 27.8 pg (ref 26.0–34.0)
MCHC: 31.4 g/dL — ABNORMAL LOW (ref 32.0–36.0)
MCV: 88 fL (ref 80–100)
Platelet: 293 10*3/uL (ref 150–440)
RBC: 4.64 10*6/uL (ref 3.80–5.20)
RDW: 14.8 % — ABNORMAL HIGH (ref 11.5–14.5)
WBC: 14.5 10*3/uL — ABNORMAL HIGH (ref 3.6–11.0)

## 2014-11-05 LAB — TROPONIN I: Troponin-I: <0.02 ng/mL

## 2014-11-26 ENCOUNTER — Ambulatory Visit: Payer: Self-pay | Admitting: Pain Medicine

## 2014-12-07 ENCOUNTER — Ambulatory Visit: Payer: Self-pay | Admitting: Pain Medicine

## 2014-12-24 ENCOUNTER — Ambulatory Visit: Payer: Self-pay | Admitting: Pain Medicine

## 2014-12-30 ENCOUNTER — Ambulatory Visit: Payer: Self-pay | Admitting: Pain Medicine

## 2015-02-02 ENCOUNTER — Ambulatory Visit
Admit: 2015-02-02 | Disposition: A | Discharge status: Home / Self Care | Payer: Self-pay | Attending: Pain Medicine | Admitting: Pain Medicine

## 2015-02-15 ENCOUNTER — Ambulatory Visit
Admit: 2015-02-15 | Disposition: A | Discharge status: Home / Self Care | Payer: Self-pay | Attending: Pain Medicine | Admitting: Pain Medicine

## 2015-02-16 NOTE — Discharge Summary (Signed)
PATIENT NAME:  [name], [name] MR#:  [id number] DATE OF BIRTH:  10-30-1946  DATE OF ADMISSION:  07/04/2012 DATE OF DISCHARGE:  07/08/2012  HISTORY: Ms. Wahba is a 69 year old white lady with known chronic obstructive pulmonary disease who came in with a 10 day history of increasing shortness of breath and wheezing with a productive cough. She had no fever. The patient had been in the Emergency Room earlier in the day after a fall at home hitting her head. She did have a CT scan of the head at that time that was unremarkable. When she got home however she started having increasing shortness of breath and returned to the ER. Because of persistent bilateral wheezing, she was admitted for further evaluation.   PAST MEDICAL HISTORY:  1. Asthmatic chronic obstructive pulmonary disease with continued tobacco use.  2. Hypertension.  3. Hypothyroidism.  4. Chronic back pain, followed by the pain clinic.  5. Osteoarthritis.   PAST SURGICAL HISTORY:  1. Previous bladder repair.  2. Hysterectomy.  3. Cholecystectomy.  4. Appendectomy.  5. Cervical spine fusion. 6. Right rotator cuff repair.  7. Left shoulder replacement.   ADMISSION MEDICATIONS: 1. Albuterol 2 puffs every four hours p.r.n.  2. Fosamax 70 mg once a week. 3. Meloxicam 1 tablet daily.  4. Hydrochlorothiazide 25 mg daily.  5. Potassium chloride 20 mEq daily.  6. Levothyroxine 125 mcg daily.  7. Metoprolol 100 mg twice a day. 8. Paroxetine 40 mg daily.  9. Norco 5/325 mg one every six hours p.r.n.  10. Flovent 1 puff twice a day. 11. Calcium supplement.   ALLERGIES: No known drug allergies.   ADMISSION PHYSICAL EXAMINATION: As described by the admitting physician, examination revealed a blood pressure of 168/101, a respiratory rate of 22, a pulse of 100, and an oxygen saturation of 95%. The patient was noted to be slightly tachypneic but was not using the accessory muscles of respiration. She did have a prolonged expiratory phase  with bilateral expiratory wheezing. The remainder of the examination was basically unremarkable.   LABS/RADIOLOGIC STUDIES: The patient's admission CBC showed a hemoglobin of 15.4 with a hematocrit of 45.9. Platelet count was 195,000. White count was 13,300. Admission basic metabolic panel was notable only for a sodium of 135, which was of questionable clinical significance.   Admission urinalysis showed 1+ blood on the dipstick, but the microscopic was unremarkable.   Admission electrocardiogram showed a sinus tachycardia with aberrant conduction. There was left axis deviation. Premature atrial complexes were noted.   Admission chest x-ray showed no acute cardiopulmonary disease. A comminuted proximal right humeral fracture was an incidental finding. It was confirmed by routine radiographs of the right shoulder.   Head CT without contrast showed mild diffuse cerebral atrophy. An incidental finding was total opacification of the left maxillary sinus. It was noted to have progressed since 2008. The findings were confirmed by routine CT scan of the sinuses.   HOSPITAL COURSE: The patient was admitted to the regular medical floor where she was rehydrated with IV fluids. She was started on IV steroids, IV antibiotics, and SVNs. She was seen in consultation by [facility] who suggested continuing her antibiotics for at least two weeks after discharge for her chronic sinusitis. She was also seen by Dr. Rudene Christians in orthopedics who apparently had been seeing her as an outpatient for her shoulder fracture on the right. The patient showed slow but gradual improvement. She was eventually bridged to p.o. medications. She was ambulated without difficulty. At  the time of discharge, she still had a few scattered musical sounds, but no overt wheezing.   DISCHARGE DIAGNOSES:  1. Acute asthma flare.  2. Acute on chronic sinusitis.   DISCHARGE DISPOSITION: The patient was discharged on her routine preadmission  medications without change. She was placed on a Sterapred double strength 12 day taper. She was also placed on Levaquin 500 mg daily x2 weeks.   The patient was discharged on a low sodium diet with activity as tolerated.   She is to be followed up by Dr. Kathyrn Sheriff at [facility] in 1 to 2 weeks. She is to see me in followup in 3 to 4 weeks.  ____________________________ Hewitt Blade Sarina Ser, MD jbw:slb D: 07/21/2012 16:23:00 ET T: 07/22/2012 13:12:22 ET JOB#: [id number]  cc: Jenny Reichmann B. Sarina Ser, MD, <Dictator> Lottie Mussel III MD ELECTRONICALLY SIGNED 07/23/2012 8:02

## 2015-02-16 NOTE — Consult Note (Signed)
PATIENT NAMEMAKAELAH, [name] MR#:  [id number] DATE OF BIRTH:  02/22/1946  DATE OF CONSULTATION:  07/05/2012  REQUESTING PHYSICIAN:  Lisette Grinder, III, MD CONSULTING PHYSICIAN:  Laurice Record. Holley Bouche., MD  CHIEF COMPLAINT: Right shoulder fracture.   HISTORY OF PRESENT ILLNESS: The patient is a 69 year old female who tripped and fell on concrete on 01/31/2012 and sustained a right proximal humerus fracture. She has been treated conservatively with mobilization. She was last seen by Dr. 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 She has subsequently been treated with a bone stimulator. She has intermittently been using a sling for comfort. The patient was admitted to the hospital following a fall and acute exacerbation of her chronic obstructive pulmonary disease. The right proximal humerus fracture was noted on chest x-ray, and consultation was placed accordingly. She denied any new injury to the shoulder. She denied any new onset of pain. She has had some persistent right shoulder pain and limited activity.   PAST MEDICAL HISTORY:  1. Asthma.  2. Depression. 3. Chronic obstructive pulmonary disease.  4. Osteoporosis.  5. Hypothyroidism.  6. Hypertension. 7. A hernia. 8. Migraine headaches.   PAST SURGICAL HISTORY: Status post appendectomy, cholecystectomy, herniorrhaphy, vaginal surgery, partial hysterectomy, varicose vein surgery of the left leg, open reduction internal fixation of the right distal radius fracture (03/2007), open repair of right rotator cuff tear with distal clavicle resection (02/2005), anterior cervical fusion C4-C5, C5-6, C6-7 with insertion of interbody device at C4-5 and H8-2 and X9-3, application of anterior cervical plate from Z1-6 to R6-7 (10/2007), left shoulder hemiarthroplasty (08/2009).   ALLERGIES: Plastic tape.   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 She is married.  She smokes approximately half-pack of cigarettes a day.   FAMILY HISTORY: Positive for asthma in mother and sibling, diabetes in mother and sibling, heart attack in mother and father, hypertension in mother and father, osteoporosis in the mother, rheumatoid arthritis in mother and sibling, and a stroke in father and mother.   CURRENT MEDICATIONS:  1. Norco 5/325, 1 tablet p.o. q.6 hours. 2. Norvasc 5 mg p.o. daily.  3. Calcium carbonate with vitamin D 1 tablet b.i.d.  4. Colace 100 mg b.i.d.  5. Lovenox 40 mg subcutaneous q.12 hours. 6. Flovent 220 mcg inhaler 1 puff b.i.d.  7. Hydrochlorothiazide 25 mg daily.  8. Synthroid 0.125 mg every a.m.  9. Mobic 15 mg daily.  10. Robaxin 750 mg q.6 hours p.r.n.  11. Solu-Medrol 60 mg IV q.6 hours. 12. Lopressor 100 mg p.o. b.i.d.  13. Nicotine patch 21 mcg transdermal daily.  14. Paxil 40 mg daily.  15. Potassium chloride extended release 20 mEq daily.  16. Rocephin 1 gram IV piggyback daily.   REVIEW OF SYSTEMS: Negative for fever, chills. Positive for fatigue. HEENT: No vision changes, hearing impairment, sore throat, swallowing difficulties. CARDIOVASCULAR: No chest pain but positive shortness of breath and audible wheezing. GASTROINTESTINAL: No abdominal pain, nausea, or vomiting. GU: No dysuria, frequency, or burning on urination.  MUSCULOSKELETAL: Some chronic back pain. Some right shoulder pain with attempted activity. NEUROLOGIC: No gross numbness, causalgia, or weakness.   PHYSICAL EXAMINATION:   GENERAL: The patient is an elderly-appearing female seen lying in the bed in no acute distress. Sling is in place on the right upper extremity.   HEENT: Atraumatic, normocephalic. Sclerae are clear. Extraocular motion is intact. Oropharynx is dry and benign.  NECK: Supple, nontender, good range of motion.   LUNGS: Diffuse wheezing throughout inspiratory and expiratory function. No use of accessory muscles.   ABDOMEN: Soft, nontender,  nondistended.   MUSCULOSKELETAL: Notable for findings of the right upper extremity. Normal shoulder contour is appreciated. Some tenderness noted to palpation about the proximal humerus. Some guarding was noted with range of motion. There is also some relative stiffness with limited ability to adduction or forward flexion. Reasonably good range of motion of the elbow and wrist. Good grip strength. No gross ecchymosis is appreciated to the shoulder. The shoulder strength was difficult to assess.   NEUROLOGIC: Awake, alert, and oriented. Sensory function is grossly intact to both upper extremities. Motor strength is 4 to 4+/5 except as noted above. Reasonably good motor coordination.   X-RAYS: Radiographs of the right shoulder from [facility] were reviewed and compared to radiographs from [facility]. Proximal humerus fracture is noted with some evidence of impaction. Osteopenic changes are noted to the humeral head. 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   IMPRESSION: Nonunion of right proximal humerus fracture.   PLAN: Plans were discussed with the patient. We had a discussion about discontinuation of smoking so as to improve the chances of bony healing. Detrimental effect of tobacco use on bone healing was discussed. I have encouraged her to have family member retrieve bone stimulator so that the bone stimulator may be used to the right shoulder while she is hospitalized. She was fitted with a sling for comfort.   The patient already has a follow-up appointment with Dr. Gordy Levan in October.   ____________________________ Laurice Record. Holley Bouche., MD jph:vtd D: 07/05/2012 15:35:00    T: 07/06/2012 09:06:06   JOB#: [id number] cc: Jeneen Rinks P. Holley Bouche., MD, <Dictator> Laurice Record Holley Bouche MD ELECTRONICALLY SIGNED 07/10/2012 21:54

## 2015-02-16 NOTE — Consult Note (Signed)
Brief Consult Note: Diagnosis: Nonunion of right proximal humerus fracture.   Patient was seen by consultant.   Consult note dictated.   Comments: Sling for comfort. Family to bring bone stimulator tomorrow. Encouraged patient to discontinue smoking due to the detrimental effect on bone healing. Patient already has a follow-up appointment with Dr. Rudene Christians in October. Will sign off.  Please call with questions.  Electronic Signatures: Dereck Leep (MD)  (Signed 06-Sep-13 15:14)  Authored: Brief Consult Note   Last Updated: 06-Sep-13 15:14 by Dereck Leep (MD)

## 2015-02-16 NOTE — H&P (Signed)
PATIENT NAMECRYSTAL, [name] MR#:  [id number] DATE OF BIRTH:  Apr 30, 1946  DATE OF ADMISSION:  07/04/2012  PRIMARY CARE PHYSICIAN: John B. Walker, III, MD    CHIEF COMPLAINT: Increased shortness of breath and wheezing along with cough x10 days.   HISTORY OF PRESENT ILLNESS: [name] is a 69 year old Caucasian female with history of chronic obstructive pulmonary disease, systemic hypertension, and ongoing tobacco abuse. She fell earlier this morning and sustained some head injury. She had a CAT scan of the head which was negative and she went home to come back again stating that she has increasing shortness of breath for the last one week and a half associated with cough with yellowish sputum and progressive wheezing She reports no fever. No chest pain. Evaluation in the Emergency Department was consistent with bilateral wheezing consistent with COPD exacerbation. Her chest x-ray showed no evidence of consolidation or pneumonia. She is admitted for further evaluation and treatment. The patient had been using her inhalers to no avail.   REVIEW OF SYSTEMS: CONSTITUTIONAL: The patient denies having any fever, no chills but she feels cold. Mild fatigue. No night sweats. EYES: No blurring of vision. No double vision. ENT: No hearing impairment. No sore throat. No dysphagia. CARDIOVASCULAR: No chest pain but reports shortness of breath and wheezing. No syncope. RESPIRATORY: Reports shortness of breath and wheezing associated with cough. No hemoptysis. GASTROINTESTINAL: No abdominal pain. No vomiting. No diarrhea. GENITOURINARY: No dysuria. No frequency of urination. MUSCULOSKELETAL: No joint pain or acute pain or swelling. No muscular pain or swelling other than her chronic back pain. INTEGUMENTARY: No skin rash. No ulcers. NEUROLOGY: No focal weakness. No seizure. No headache. PSYCHIATRY: No anxiety but has history of depression. ENDOCRINE: No polyuria or polydipsia. No heat or cold intolerance.   PAST MEDICAL  HISTORY:  1. Chronic obstructive pulmonary disease with asthma component.  2. Systemic hypertension.  3. Hypothyroidism.  4. Chronic back pain, followed by the Pain Clinic.  5. Osteoarthritis.  6. Ongoing tobacco abuse.   PAST SURGICAL HISTORY:  1. Left shoulder replacement. 2. Right rotator cuff repair. 3. Cervical neck fusion.  4. Appendectomy.  5. Cholecystectomy.  6. Hysterectomy. 7. Bladder repair.   SOCIAL HABITS: Chronic smoker 1 pack per day for more than 30 years. She continues to smoke. No history of alcohol or drug abuse.   SOCIAL HISTORY: She is married living with her husband. She retired from her work. She used to work as a Scientist, water quality. She is now on disability based on her back pain.   FAMILY HISTORY: Her mother suffered from COPD. She has a brother who also suffered from COPD and he died from complications of pneumonia. Her parents suffered from hypertension. Her mother had diabetes.   ADMISSION MEDICATIONS:  1. Albuterol inhaler 2 puffs q.4 hours p.r.n.  2. Fosamax 70 mg once a week. 3. Meloxicam, dose was not specified, once a day.  4. Hydrochlorothiazide 25 mg a day.  5. Potassium chloride 20 mEq a day.  6. Levothyroxine 125 mcg once a day. 7. Metoprolol 100 mg twice a day.  8. Stool softener once at bedtime.  9. Paroxetine 40 mg once a day.  10. Norco 5/325 mg p.r.n.  11. Flovent twice a day.  12. Calcium and Vitamin D.   ALLERGIES: No known drug allergies but Band-Aid or plastic tape causes skin rash.      PHYSICAL EXAMINATION:   VITAL SIGNS: Blood pressure 168/101, respiratory rate 22, pulse 100, oxygen saturation 95%.  GENERAL APPEARANCE: Elderly female laying in bed in no acute distress.   HEAD: No pallor. No icterus. No cyanosis.   EARS, NOSE, AND THROAT: Hearing was normal. Nasal mucosa, lips, tongue were normal.   EYES: Normal eyelids and conjunctivae. Pupils were equal and reactive to light. They are about 5 mm.   NECK: Supple. Trachea at  midline. No thyromegaly. No cervical lymphadenopathy. No masses.   HEART: Normal S1, S2. No S3, S4. No murmur. No gallop. No carotid bruits.   RESPIRATORY: Slight tachypnea. She is not using accessory muscles. Prolonged expiratory phase and bilateral expiratory wheezing. No rales.   ABDOMEN: Soft without tenderness. No hepatosplenomegaly. No masses. No hernias.   SKIN: No ulcers. No subcutaneous nodules.   MUSCULOSKELETAL: No joint swelling. No clubbing.   NEUROLOGIC: Cranial nerves II through XII are intact. No focal motor deficit.   PSYCHIATRIC: The patient is alert and oriented x3. Mood and affect were flat.   LABORATORY, DIAGNOSTIC, AND RADIOLOGICAL DATA: Chest x-ray showed no consolidation or effusion. There is evidence of left shoulder replacement with hardware. The right shoulder shows possible old fracture. I am not sure about the age of this but needs to be further clarified with the radiologist.   EKG showed sinus tachycardia at rate of 103 per minute. Unremarkable EKG except for nonspecific T wave abnormalities in the lateral leads and left axis deviation.   Serum glucose 81, BUN 10, creatinine 0.6, sodium 135, potassium 3.8. Total CPK 107. Troponin less than 0.02. CBC showed white count of 13,000, hemoglobin 15, hematocrit 45, platelet count 195. Urinalysis was unremarkable.   ASSESSMENT:  1. Acute exacerbation of COPD.  2. Systemic hypertension, uncontrolled.  3. Hypothyroidism.  4. Chronic back pain.  5. Osteoarthritis.  6. Tobacco abuse.  7. History of hysterectomy.  8. Appendectomy.  9. Cholecystectomy.  10. Left shoulder repair. 11. Right rotator cuff injury repair.   PLAN:  1. Will admit to the medical floor. 2. Start bronchodilator therapy with DuoNebs q.4 hours while awake.  3. IV Solu-Medrol.  4. IV antibiotic using Rocephin.  5. Oxygen supplementation.  6. Monitor her response.  7. Continue home medications as listed above.  8. The patient was advised  to quit smoking. I will offer nicotine patch 21 mg once a day.   TIME SPENT: Time Spent evaluating this patient and reviewing medical records took more than 55 minutes.    ____________________________ Clovis Pu. Lenore Manner, MD amd:drc D: 07/04/2012 22:57:27 ET T: 07/05/2012 07:00:31 ET JOB#: [id number]  cc: Clovis Pu. Lenore Manner, MD, <Dictator> John B. Sarina Ser, MD Mike Craze Irven Coe MD ELECTRONICALLY SIGNED 07/05/2012 22:32

## 2015-02-16 NOTE — Consult Note (Signed)
PATIENT NAMECORLETTE, [name] MR#:  [id number] DATE OF BIRTH:  06-Dec-1945  DATE OF CONSULTATION:  07/05/2012  REFERRING PHYSICIAN:  John B. Sarina Ser, MD  CONSULTING PHYSICIAN:  Huey Romans, MD  REASON FOR CONSULTATION: Chronic sinusitis.  HISTORY OF PRESENT ILLNESS: The patient is a 69 year old white female who has had chronic obstructive pulmonary disease and ongoing tobacco abuse. Approximately two weeks ago, she had a head cold that started up and then from there she progressively had more shortness of breath and purulent yellowish sputum. She had progressive wheezing as well. She has been on multiple medications with a history of previous chronic obstructive pulmonary disease. She presented to the Emergency Room yesterday with increasing shortness of breath. She fell at home and injured her head some. CT scan was negative for that. She is noted to have exacerbation of her chronic obstructive pulmonary disease and possible sinusitis. She had been on Rocephin IV along with fluticasone. She had a CT scan of her sinuses done today that showed significant sinusitis. Assessment was made to follow her for the chronic sinusitis to see if we can help get this cleared.   PAST MEDICAL HISTORY:  1. Chronic obstructive pulmonary disease with asthma component. 2. Systemic hypertension. 3. Hypothyroidism. 4. Chronic back pain. 5. Osteoarthritis. 6. Ongoing tobacco abuse.   SOCIAL HISTORY: She smokes 1 pack a day and has for more than 30 years. She continues to be a smoker at home. She denies any alcohol or drug abuse. She is retired and has Disability because of back pain.   FAMILY HISTORY: Her mother and brother had chronic obstructive pulmonary disease, and her brother died from complications of pneumonia. Both parents had hypertension. Her mother had diabetes.   CURRENT MEDICATIONS: As noted in the chart.   DRUG ALLERGIES: None, although the adhesive on tape gives her skin irritation.    PHYSICAL EXAMINATION:  GENERAL: The patient is awake and alert, very cooperative, seems to be a good historian.   HEENT: She has healthy ear canals. Eardrums look normal. The nose looks open anteriorly. There is some septal deviation. She does not show any purulence in the front of her nose. The oropharynx is clear. She has dentures. The tongue is normal. Posterior pharynx is not red at all.   NECK: Negative for any nodes or masses.   DIAGNOSTIC AND RADIOLOGICAL DATA: The CT scan of her sinuses done earlier shows soft tissue density within the left maxillary sinus almost completely opacifying it. She has evidence of left posterior sinus disease and left sphenoid sinus disease. She has a small amount of fluid in the right sphenoid sinus as well. These findings are consistent with acute and chronic sinusitis, most on the left side.   IMPRESSION/PLAN: The patient has evidence of acute/chronic sinusitis. She is on IV Rocephin right now which should cover this very well. She is also on Flonase and should remain on that long term. She also needs a decongestant expectorant and typically would use Mucinex D regular strength 1 pill twice a day. I would like her to remain on an antibiotic, Mucinex D and Flonase for at least two weeks total for treatment of the chronic sinusitis and then plan to re-evaluate in the office to make sure that the sinuses are clear. If she is clinically improved in her nose and sinuses, we will plan to re-x-ray to make sure the sinuses have cleared. If she clinically continues to have sinus problems, she will need a different  antibiotic to continue with treatment for a longer time to see if we can get it cleared, then followed by x-ray. Persisting sinus disease will likely require surgery for opening this. Because of the chronic obstructive pulmonary disease, she may be a candidate for balloon sinuplasty in the office under local anesthesia instead of using a general anesthesia. We  will plan to see her in the office in two weeks.  ____________________________ Huey Romans, MD phj:cbb D: 07/05/2012 18:05:37 ET T: 07/06/2012 11:12:01 ET JOB#: [id number]  cc: Huey Romans, MD, <Dictator> John B. Sarina Ser, MD Huey Romans MD ELECTRONICALLY SIGNED 07/08/2012 9:12

## 2015-02-19 NOTE — Discharge Summary (Signed)
PATIENT NAMEKAMILLA, HANDS MR#:  [id number] DATE OF BIRTH:  [birth year]  DATE OF ADMISSION:  02/27/2013 DATE OF DISCHARGE:  03/02/2013  DISCHARGE DIAGNOSES: 1.  Chronic obstructive pulmonary disease exacerbation.  2.  Hypertension.  3.  Gastroesophageal reflux disease.  4.  Hypothyroidism.  5.  Depression.   DISCHARGE MEDICATIONS: 1.  Amlodipine 5 mg p.o. daily.  2.  Metoprolol tartrate 100 mg p.o. b.i.d.  3. Paroxetine 40 mg p.o. daily.  4.  Multivitamin 1 tab daily.  5.  Alendronate 70 mg p.o. weekly.  6.  Hydrochlorothiazide 25 mg p.o. daily.  7.  Potassium chloride, Klor-Con 20 mEq p.o. daily.  8.  Levothyroxine 125 mcg p.o. daily.  9.  Meloxicam 7.5 mg p.o. 1 to 2 tabs p.o. daily.  10.  Omeprazole 20 mg p.o. daily.  11.  Calcium and vitamin D as directed.  12.  Neurontin 300 mg 1 capsule p.o. 1 to 2 times a day if tolerated.  13.  Levofloxacin 250 mg p.o. daily x 5 more days.  14.  Prednisone taper as directed.   CONSULTS: None.   PROCEDURES: None.   PERTINENT LABS ON DAY OF DISCHARGE: The patient was ambulated and had an O2 sat greater than 95% on room air. Creatinine 0.68. White blood cell count 14.7, hemoglobin 12.6 and platelets of 224.   BRIEF HOSPITAL COURSE: The patient initially came in with acute shortness of breath consistent with a COPD exacerbation, was placed on IV Solu-Medrol and levofloxacin and was given breathing treatments. Has responded very well. On the day of discharge, she was able to ambulate on room air with sats greater than 95%. Her cough had improved. Her sputum production improved and her breathing improved. Plan to discharge on a prednisone taper and  levofloxacin x 5 more days. Continue breathing treatments at home as needed. Follow up with Dr. Gilford Rile in 10 days. Other chronic medical issues remained stable. No changes to her regimen.     ____________________________ Dion Body, MD kl:cs D: 03/02/2013 09:41:00 ET T: 03/02/2013  15:00:00 ET JOB#: [id number]  cc: Dion Body, MD, <Dictator> John B. Sarina Ser, MD Dion Body MD ELECTRONICALLY SIGNED 03/05/2013 16:50

## 2015-02-19 NOTE — H&P (Signed)
PATIENT NAMELURLEEN, [name] MR#:  [id number] DATE OF BIRTH:  [birth year]  DATE OF ADMISSION:  02/27/2013  PRIMARY CARE PHYSICIAN:  Dr. Lisette Grinder, III.   REFERRING PHYSICIAN:  Dr. Carrie Mew.   CHIEF COMPLAINT:  A 2-day history of shortness of breath associated with chest tightness and cough.   HISTORY OF PRESENT ILLNESS:  The patient is a 69 year old Caucasian female with a past medical history of COPD, still smokes 1 pack a day, has been having productive cough and chest tightness for the past two days.  With the shortness of breath associated with chest tightness is getting worse, the patient came into the ER.  The patient denies any chest pain or dizziness.  In the ER, the patient was given Solu-Medrol and neb treatments when she started saturating 97% on room air.  The ER physician thought of discharging her home with by mouth prednisone and ambulated her in the hallway.  The patient became hypoxemic while ambulating in the hallway associated with a worsening of the shortness of breath.  Hospitalist team is called to admit the patient for acute exacerbation of COPD.  During my examination, the patient is still actively wheezing, but not using any accessory muscles.  Started feeling slightly better, but still she is feeling tightness in her chest.  No family members at bedside.   PAST MEDICAL HISTORY:  Hypertension, asthma, COPD, chronic low back pain, degenerative joint disease, osteoporosis, hypothyroidism, scoliosis.   PAST SURGICAL HISTORY:  Bladder repair, rotator cuff surgery, cholecystectomy, shoulder surgery on the left, hysterectomy, hernia repair.   ALLERIGES:  BAND-AIDS.   PSYCHOSOCIAL HISTORY:  Lives at home with husband.  Still smokes 1 pack a day.  Denies alcohol or illicit drug usage.   FAMILY HISTORY:  Mother had a history of diabetes mellitus.   HOME MEDICATIONS:  Paroxetine 40 mg once daily, omeprazole 20 mg once daily, Neurontin 300 mg 1 to 2 tablets by mouth on a daily  basis, multivitamin 1 tablet once daily, metoprolol tartrate 100 mg 2 times a day, meloxicam 7.5 mg 1 to 2 tablets once a day, levothyroxine 125 mcg once daily, hydrochlorothiazide 25 mg once daily, Klor-Con 20 mEq 1 tablet once daily, Colace 100 mg once daily, amlodipine 5 mg once a day, alendronate 70 mg once a week, Tylenol as needed basis.   REVIEW OF SYSTEMS:  CONSTITUTIONAL:  Denies any weight loss or weight gain, but complaining of fatigue and tiredness.  EYES:  No blurry vision.  Denies any glaucoma or cataracts.  EARS, NOSE, THROAT:  Denies any epistaxis, discharge, postnasal drip, or difficulty in swallowing.  RESPIRATION:  Complaining of cough which is productive in nature associated with shortness of breath.  The patient has chronic history of COPD.  CARDIOVASCULAR:  Denies any chest pain, palpitations or syncope.  GASTROINTESTINAL:  No nausea, vomiting, diarrhea, abdominal pain.  GENITOURINARY:  Denies any dysuria or hematuria.  GYNECOLOGIC AND BREAST:  No breast mass or vaginal discharge.  ENDOCRINE:  No polyuria, nocturia or thyroid problems.  HEMATOLOGIC AND LYMPHATIC:  No anemia, easy bruising or bleeding.  INTEGUMENTARY:  No acne, rash, lesions.  MUSCULOSKELETAL:  Chronic low back pain, chronic shoulder pain on the left.  Denies any gout, swelling.  NEUROLOGIC:  Denies any vertigo, ataxia, dementia, headache.  PSYCHIATRIC:  No insomnia, ADD, OCD.   PHYSICAL EXAMINATION: VITAL SIGNS:  Temperature 98 degrees Fahrenheit, pulse 74, respirations 19, blood pressure 121/59, pulse ox 94%.  GENERAL APPEARANCE:  Not under acute distress.  Moderately built and moderately nourished.  HEENT:  Normocephalic, atraumatic.  Pupils are equally reacting to light and accommodation.  No scleral icterus.  Extraocular movements are intact.  No sinus tenderness.  No postnasal drip.  No pharyngeal exudates.  NECK:  Supple.  No JVD.  No thyromegaly.  No lymphadenopathy.  LUNGS:  Diffuse wheezing is  present which is audible, but no accessory muscle usage.  Moderate air entry is present.  No anterior chest wall tenderness on palpation.  CARDIAC:  S1 and S2 normal.  Regular rate and rhythm.  No murmurs.  No edema.  GASTROINTESTINAL:  Soft.  Bowel sounds are positive in all four quadrants.  Nontender, nondistended.  No masses felt.  No rebound tenderness.  No hepatosplenomegaly.  NEUROLOGIC:  Awake, alert, oriented x 3.  Motor and sensory are grossly intact.  Following verbal commands.  Cranial nerves II through XII are intact.  Reflexes are 2+.  MUSCULOSKELETAL:  No joint effusion, tenderness or erythema.  SKIN:  No rashes, lesions.  Warm to touch.  Normal turgor.  MUSCULOSKELETAL:  No joint effusion, tenderness or erythema is present.  PSYCHIATRIC:  Normal mood and affect.   LABORATORY AND IMAGING STUDIES:  Glucose 136, BUN 7, creatinine 0.64, sodium 135, potassium 3.6, chloride 101, CO2 30, anion gap is 4, GFR greater than 60, osmolality 270, calcium 9.1, WBC 17.2, hemoglobin 13.8, hematocrit 40.9, platelet count 259.  A 12-lead EKG has revealed sinus bradycardia at 59 beats per minute, left axis deviation.  No ST-T wave changes.  Chest x-ray has revealed cardiomegaly, but no acute infiltrates.   ASSESSMENT AND PLAN:  A 69 year old female coming to the ER with a 2-day history of shortness of breath associated with chest tightness and wheezing, will be admitted with the following assessment and plan.  1.  Shortness of breath with hypoxemia from acute exacerbation of chronic obstructive pulmonary disease.  We will give her Solu-Medrol, IV levofloxacin and DuoNeb neb treatments and albuterol as needed basis.  We will give her oxygen.  2.  Nicotine dependence.  Counseled patient to quit smoking and we will consider nicotine patch.  3.  Hypertension.  Blood pressure is stable.  Resume her home medication.  4.  Hypothyroidism.  Continue Synthroid.  5.  Chronic low back pain.  Continue outpatient  follow-up with pain management and we will resume home medications.   6.  Chronic history of osteoporosis.  Continue home medication alendronate once weekly.   7.  We will provide her gastrointestinal and deep vein thrombosis prophylaxis.  8.  FULL CODE.   Total time spent on admission is 50 minutes.   The patient will be turned over to Dr. Lisette Grinder, III in a.m.     ____________________________ Nicholes Mango, MD ag:ea D: 02/27/2013 02:07:31 ET T: 02/27/2013 03:10:10 ET JOB#: [id number]  cc: Nicholes Mango, MD, <Dictator> Nicholes Mango MD ELECTRONICALLY SIGNED 03/10/2013 0:41

## 2015-02-25 ENCOUNTER — Emergency Department: Admit: 2015-02-25 | Disposition: A | Discharge status: Home / Self Care | Payer: Self-pay | Admitting: Internal Medicine

## 2015-03-04 ENCOUNTER — Encounter: Payer: Self-pay | Admitting: Pain Medicine

## 2015-03-04 ENCOUNTER — Ambulatory Visit: Payer: PPO | Admitting: Pain Medicine

## 2015-03-04 VITALS — BP 136/94 | HR 71 | Temp 98.2°F | Resp 16 | Ht <= 58 in | Wt 187.0 lb

## 2015-03-04 DIAGNOSIS — Z90710 Acquired absence of both cervix and uterus: Secondary | ICD-10-CM

## 2015-03-04 DIAGNOSIS — J9601 Acute respiratory failure with hypoxia: Secondary | ICD-10-CM | POA: Diagnosis not present

## 2015-03-04 DIAGNOSIS — M419 Scoliosis, unspecified: Secondary | ICD-10-CM | POA: Diagnosis present

## 2015-03-04 DIAGNOSIS — J441 Chronic obstructive pulmonary disease with (acute) exacerbation: Secondary | ICD-10-CM | POA: Diagnosis not present

## 2015-03-04 DIAGNOSIS — E039 Hypothyroidism, unspecified: Secondary | ICD-10-CM | POA: Diagnosis present

## 2015-03-04 DIAGNOSIS — M179 Osteoarthritis of knee, unspecified: Secondary | ICD-10-CM | POA: Insufficient documentation

## 2015-03-04 DIAGNOSIS — Z716 Tobacco abuse counseling: Secondary | ICD-10-CM | POA: Diagnosis not present

## 2015-03-04 DIAGNOSIS — M5136 Other intervertebral disc degeneration, lumbar region: Secondary | ICD-10-CM | POA: Insufficient documentation

## 2015-03-04 DIAGNOSIS — I10 Essential (primary) hypertension: Secondary | ICD-10-CM | POA: Diagnosis present

## 2015-03-04 DIAGNOSIS — Z8249 Family history of ischemic heart disease and other diseases of the circulatory system: Secondary | ICD-10-CM

## 2015-03-04 DIAGNOSIS — M170 Bilateral primary osteoarthritis of knee: Secondary | ICD-10-CM

## 2015-03-04 DIAGNOSIS — Z833 Family history of diabetes mellitus: Secondary | ICD-10-CM

## 2015-03-04 DIAGNOSIS — M1710 Unilateral primary osteoarthritis, unspecified knee: Secondary | ICD-10-CM | POA: Insufficient documentation

## 2015-03-04 DIAGNOSIS — F17210 Nicotine dependence, cigarettes, uncomplicated: Secondary | ICD-10-CM | POA: Diagnosis present

## 2015-03-04 DIAGNOSIS — R011 Cardiac murmur, unspecified: Secondary | ICD-10-CM | POA: Diagnosis present

## 2015-03-04 DIAGNOSIS — J45909 Unspecified asthma, uncomplicated: Secondary | ICD-10-CM | POA: Diagnosis present

## 2015-03-04 DIAGNOSIS — M5030 Other cervical disc degeneration, unspecified cervical region: Secondary | ICD-10-CM

## 2015-03-04 DIAGNOSIS — Z9889 Other specified postprocedural states: Secondary | ICD-10-CM

## 2015-03-04 DIAGNOSIS — Z9049 Acquired absence of other specified parts of digestive tract: Secondary | ICD-10-CM | POA: Diagnosis present

## 2015-03-04 MED ORDER — OXYCODONE HCL 5 MG PO CAPS: ORAL_CAPSULE | ORAL | Status: DC

## 2015-03-04 NOTE — Progress Notes (Signed)
   Subjective:    Patient ID: [name], female    DOB: 13-May-1946, 69 y.o.   MRN: [id number]  HPI    Review of Systems     Objective:   Physical Exam        Assessment & Plan:

## 2015-03-04 NOTE — Progress Notes (Signed)
Patient given script as ordered for oxycodone '5mg'$ . Pre procedure instructions given.    Discharged ambulatory at 1215.

## 2015-03-04 NOTE — Progress Notes (Signed)
   Subjective:    Patient ID: [name], female    DOB: [birth year], 69 y.o.   MRN: [id number]  HPI SEVERE KNEE PAIN WITH LBP LE  PAIN OF LESSER DEGREE  PT DENIES TRAUMA  AND STATES THAT KNEES ARE PREVENTING HER FROM WALKING AND KEEPING HER AWAKE AS WELL PT WILL SEE ORTHOPEDIC SURGEON  PT PREFERS TO AVOID SURGERY WE WILL PROCEED WITH KNEE INJECTION NEXT VISIT PENDING INSURANCE APPROVAL  Review of Systems     Objective:   Physical Exam  Constitutional: She is oriented to person, place, and time. She appears well-nourished.  HENT:  Head: Normocephalic.  Neck: Neck supple.  Cardiovascular: Normal rate and regular rhythm.   Pulmonary/Chest: Breath sounds normal.  Abdominal: Soft. Bowel sounds are normal. She exhibits no distension and no mass. There is no tenderness. There is no rebound and no guarding.  Musculoskeletal: She exhibits tenderness.  Neurological: She is alert and oriented to person, place, and time.   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

## 2015-03-04 NOTE — Patient Instructions (Signed)
Joint Injection  Care After  Refer to this sheet in the next few days. These instructions provide you with information on caring for yourself after you have had a joint injection. Your caregiver also may give you more specific instructions. Your treatment has been planned according to current medical practices, but problems sometimes occur. Call your caregiver if you have any problems or questions after your procedure.  After any type of joint injection, it is not uncommon to experience:  · Soreness, swelling, or bruising around the injection site.  · Mild numbness, tingling, or weakness around the injection site caused by the numbing medicine used before or with the injection.  It also is possible to experience the following effects associated with the specific agent after injection:  · Iodine-based contrast agents:  ¨ Allergic reaction (itching, hives, widespread redness, and swelling beyond the injection site).  · Corticosteroids (These effects are rare.):  ¨ Allergic reaction.  ¨ Increased blood sugar levels (If you have diabetes and you notice that your blood sugar levels have increased, notify your caregiver).  ¨ Increased blood pressure levels.  ¨ Mood swings.  · Hyaluronic acid in the use of viscosupplementation.  ¨ Temporary heat or redness.  ¨ Temporary rash and itching.  ¨ Increased fluid accumulation in the injected joint.  These effects all should resolve within a day after your procedure.   HOME CARE INSTRUCTIONS  · Limit yourself to light activity the day of your procedure. Avoid lifting heavy objects, bending, stooping, or twisting.  · Take prescription or over-the-counter pain medication as directed by your caregiver.  · You may apply ice to your injection site to reduce pain and swelling the day of your procedure. Ice may be applied 03-04 times:  ¨ Put ice in a plastic bag.  ¨ Place a towel between your skin and the bag.  ¨ Leave the ice on for no longer than 15-20 minutes each time.  SEEK  IMMEDIATE MEDICAL CARE IF:   · Pain and swelling get worse rather than better or extend beyond the injection site.  · Numbness does not go away.  · Blood or fluid continues to leak from the injection site.  · You have chest pain.  · You have swelling of your face or tongue.  · You have trouble breathing or you become dizzy.  · You develop a fever, chills, or severe tenderness at the injection site that last longer than 1 day.  MAKE SURE YOU:  · Understand these instructions.  · Watch your condition.  · Get help right away if you are not doing well or if you get worse.  Document Released: 06/29/2011 Document Revised: 01/08/2012 Document Reviewed: 06/29/2011  ExitCare® Patient Information ©2015 [employer]. This information is not intended to replace advice given to you by your health care provider. Make sure you discuss any questions you have with your health care provider.

## 2015-03-06 ENCOUNTER — Encounter: Payer: Self-pay | Admitting: Emergency Medicine

## 2015-03-06 ENCOUNTER — Emergency Department: Payer: PPO

## 2015-03-06 ENCOUNTER — Inpatient Hospital Stay
Admission: EM | Admit: 2015-03-06 | Discharge: 2015-03-08 | DRG: 189 | Disposition: A | Discharge status: Home / Self Care | Payer: PPO | Attending: Internal Medicine | Admitting: Internal Medicine

## 2015-03-06 DIAGNOSIS — M5136 Other intervertebral disc degeneration, lumbar region: Secondary | ICD-10-CM | POA: Diagnosis present

## 2015-03-06 DIAGNOSIS — I10 Essential (primary) hypertension: Secondary | ICD-10-CM | POA: Diagnosis present

## 2015-03-06 DIAGNOSIS — F17210 Nicotine dependence, cigarettes, uncomplicated: Secondary | ICD-10-CM | POA: Diagnosis present

## 2015-03-06 DIAGNOSIS — E039 Hypothyroidism, unspecified: Secondary | ICD-10-CM | POA: Diagnosis present

## 2015-03-06 DIAGNOSIS — R011 Cardiac murmur, unspecified: Secondary | ICD-10-CM | POA: Diagnosis present

## 2015-03-06 DIAGNOSIS — M419 Scoliosis, unspecified: Secondary | ICD-10-CM | POA: Diagnosis present

## 2015-03-06 DIAGNOSIS — J9601 Acute respiratory failure with hypoxia: Secondary | ICD-10-CM | POA: Diagnosis not present

## 2015-03-06 DIAGNOSIS — J441 Chronic obstructive pulmonary disease with (acute) exacerbation: Secondary | ICD-10-CM | POA: Diagnosis present

## 2015-03-06 DIAGNOSIS — Z90710 Acquired absence of both cervix and uterus: Secondary | ICD-10-CM | POA: Diagnosis not present

## 2015-03-06 DIAGNOSIS — Z9049 Acquired absence of other specified parts of digestive tract: Secondary | ICD-10-CM | POA: Diagnosis present

## 2015-03-06 DIAGNOSIS — J45909 Unspecified asthma, uncomplicated: Secondary | ICD-10-CM | POA: Diagnosis present

## 2015-03-06 DIAGNOSIS — Z833 Family history of diabetes mellitus: Secondary | ICD-10-CM | POA: Diagnosis not present

## 2015-03-06 DIAGNOSIS — E079 Disorder of thyroid, unspecified: Secondary | ICD-10-CM | POA: Diagnosis present

## 2015-03-06 DIAGNOSIS — Z9889 Other specified postprocedural states: Secondary | ICD-10-CM | POA: Diagnosis not present

## 2015-03-06 DIAGNOSIS — Z720 Tobacco use: Secondary | ICD-10-CM | POA: Diagnosis present

## 2015-03-06 DIAGNOSIS — Z8249 Family history of ischemic heart disease and other diseases of the circulatory system: Secondary | ICD-10-CM | POA: Diagnosis not present

## 2015-03-06 DIAGNOSIS — Z716 Tobacco abuse counseling: Secondary | ICD-10-CM | POA: Diagnosis not present

## 2015-03-06 DIAGNOSIS — J9600 Acute respiratory failure, unspecified whether with hypoxia or hypercapnia: Secondary | ICD-10-CM

## 2015-03-06 LAB — COMPREHENSIVE METABOLIC PANEL
ALT: 12 U/L — ABNORMAL LOW (ref 14–54)
AST: 17 U/L (ref 15–41)
Albumin: 3.2 g/dL — ABNORMAL LOW (ref 3.5–5.0)
Alkaline Phosphatase: 72 U/L (ref 38–126)
Anion gap: 7 (ref 5–15)
BUN: 7 mg/dL (ref 6–20)
CO2: 25 mmol/L (ref 22–32)
Calcium: 8.5 mg/dL — ABNORMAL LOW (ref 8.9–10.3)
Chloride: 98 mmol/L — ABNORMAL LOW (ref 101–111)
Creatinine, Ser: 0.56 mg/dL (ref 0.44–1.00)
GFR calc Af Amer: >60 mL/min (ref >60)
GFR calc non Af Amer: >60 mL/min (ref >60)
Glucose, Bld: 98 mg/dL (ref 65–99)
Potassium: 3.8 mmol/L (ref 3.5–5.1)
Sodium: 130 mmol/L — ABNORMAL LOW (ref 135–145)
Total Bilirubin: 0.6 mg/dL (ref 0.3–1.2)
Total Protein: 6.3 g/dL — ABNORMAL LOW (ref 6.5–8.1)

## 2015-03-06 LAB — CBC
HCT: 35 % (ref 35.0–47.0)
Hemoglobin: 11.4 g/dL — ABNORMAL LOW (ref 12.0–16.0)
MCH: 27.6 pg (ref 26.0–34.0)
MCHC: 32.6 g/dL (ref 32.0–36.0)
MCV: 84.7 fL (ref 80.0–100.0)
Platelets: 219 10*3/uL (ref 150–440)
RBC: 4.13 MIL/uL (ref 3.80–5.20)
RDW: 15.7 % — ABNORMAL HIGH (ref 11.5–14.5)
WBC: 17.7 10*3/uL — ABNORMAL HIGH (ref 3.6–11.0)

## 2015-03-06 LAB — TROPONIN I: Troponin I: <0.03 ng/mL (ref <0.031)

## 2015-03-06 MED ORDER — IPRATROPIUM BROMIDE 0.02 % IN SOLN
0.5000 mg | Freq: Four times a day (QID) | RESPIRATORY_TRACT | Status: DC
  Administered 2015-03-06 – 2015-03-07 (×6): 0.5 mg via RESPIRATORY_TRACT
  Filled 2015-03-06 (×6): qty 2.5

## 2015-03-06 MED ORDER — ALBUTEROL SULFATE (2.5 MG/3ML) 0.083% IN NEBU
INHALATION_SOLUTION | RESPIRATORY_TRACT | Status: AC
  Filled 2015-03-06: qty 6

## 2015-03-06 MED ORDER — ALBUTEROL SULFATE (2.5 MG/3ML) 0.083% IN NEBU
2.5000 mg | INHALATION_SOLUTION | Freq: Four times a day (QID) | RESPIRATORY_TRACT | Status: DC
  Administered 2015-03-06 – 2015-03-07 (×6): 2.5 mg via RESPIRATORY_TRACT
  Filled 2015-03-06 (×6): qty 3

## 2015-03-06 MED ORDER — DEXTROSE 5 % IV SOLN
500.0000 mg | INTRAVENOUS | Status: DC
  Administered 2015-03-07: 500 mg via INTRAVENOUS
  Filled 2015-03-06 (×4): qty 500

## 2015-03-06 MED ORDER — METHYLPREDNISOLONE SODIUM SUCC 125 MG IJ SOLR
60.0000 mg | Freq: Four times a day (QID) | INTRAMUSCULAR | Status: DC
  Administered 2015-03-06 – 2015-03-08 (×9): 60 mg via INTRAVENOUS
  Filled 2015-03-06 (×9): qty 2

## 2015-03-06 MED ORDER — METHYLPREDNISOLONE SODIUM SUCC 125 MG IJ SOLR
125.0000 mg | Freq: Once | INTRAMUSCULAR | Status: AC
  Administered 2015-03-06: 125 mg via INTRAVENOUS

## 2015-03-06 MED ORDER — ACETAMINOPHEN 325 MG PO TABS: 650.0000 mg | ORAL_TABLET | Freq: Four times a day (QID) | ORAL | Status: DC | PRN

## 2015-03-06 MED ORDER — ONDANSETRON HCL 4 MG PO TABS: 4.0000 mg | ORAL_TABLET | Freq: Four times a day (QID) | ORAL | Status: DC | PRN

## 2015-03-06 MED ORDER — ONDANSETRON HCL 4 MG/2ML IJ SOLN: 4.0000 mg | Freq: Four times a day (QID) | INTRAMUSCULAR | Status: DC | PRN

## 2015-03-06 MED ORDER — OXYCODONE HCL 5 MG PO TABS
5.0000 mg | ORAL_TABLET | ORAL | Status: DC | PRN
  Administered 2015-03-07 – 2015-03-08 (×3): 5 mg via ORAL
  Filled 2015-03-06 (×3): qty 1

## 2015-03-06 MED ORDER — CEFTRIAXONE SODIUM IN DEXTROSE 20 MG/ML IV SOLN
1.0000 g | INTRAVENOUS | Status: DC
  Administered 2015-03-06: 1 g via INTRAVENOUS
  Filled 2015-03-06 (×3): qty 50

## 2015-03-06 MED ORDER — HEPARIN SODIUM (PORCINE) 5000 UNIT/ML IJ SOLN
5000.0000 [IU] | Freq: Three times a day (TID) | INTRAMUSCULAR | Status: DC
  Administered 2015-03-06 – 2015-03-08 (×6): 5000 [IU] via SUBCUTANEOUS
  Filled 2015-03-06 (×6): qty 1

## 2015-03-06 MED ORDER — ALBUTEROL SULFATE (2.5 MG/3ML) 0.083% IN NEBU
2.5000 mg | INHALATION_SOLUTION | Freq: Once | RESPIRATORY_TRACT | Status: AC
  Administered 2015-03-06: 2.5 mg via RESPIRATORY_TRACT

## 2015-03-06 MED ORDER — SODIUM CHLORIDE 0.9 % IV SOLN
INTRAVENOUS | Status: AC
  Administered 2015-03-06: 13:00:00 via INTRAVENOUS

## 2015-03-06 MED ORDER — METHOCARBAMOL 500 MG PO TABS
500.0000 mg | ORAL_TABLET | Freq: Four times a day (QID) | ORAL | Status: DC
  Administered 2015-03-07 – 2015-03-08 (×4): 500 mg via ORAL
  Filled 2015-03-06 (×11): qty 1

## 2015-03-06 MED ORDER — ACETAMINOPHEN 650 MG RE SUPP: 650.0000 mg | Freq: Four times a day (QID) | RECTAL | Status: DC | PRN

## 2015-03-06 MED ORDER — LEVOTHYROXINE SODIUM 112 MCG PO TABS
112.0000 ug | ORAL_TABLET | Freq: Every day | ORAL | Status: DC
  Administered 2015-03-07 – 2015-03-08 (×2): 112 ug via ORAL
  Filled 2015-03-06 (×2): qty 1

## 2015-03-06 MED ORDER — IPRATROPIUM-ALBUTEROL 0.5-2.5 (3) MG/3ML IN SOLN
RESPIRATORY_TRACT | Status: AC
  Administered 2015-03-06: 3 mL via RESPIRATORY_TRACT
  Filled 2015-03-06: qty 3

## 2015-03-06 MED ORDER — CITALOPRAM HYDROBROMIDE 20 MG PO TABS
20.0000 mg | ORAL_TABLET | Freq: Every day | ORAL | Status: DC
  Administered 2015-03-07 – 2015-03-08 (×2): 20 mg via ORAL
  Filled 2015-03-06 (×3): qty 1

## 2015-03-06 MED ORDER — IPRATROPIUM-ALBUTEROL 0.5-2.5 (3) MG/3ML IN SOLN
3.0000 mL | Freq: Once | RESPIRATORY_TRACT | Status: AC
  Administered 2015-03-06: 3 mL via RESPIRATORY_TRACT

## 2015-03-06 MED ORDER — GABAPENTIN 300 MG PO CAPS
300.0000 mg | ORAL_CAPSULE | Freq: Two times a day (BID) | ORAL | Status: DC
  Administered 2015-03-06 – 2015-03-08 (×4): 300 mg via ORAL
  Filled 2015-03-06 (×5): qty 1

## 2015-03-06 MED ORDER — POTASSIUM CHLORIDE 20 MEQ PO PACK
20.0000 meq | PACK | Freq: Once | ORAL | Status: DC
  Filled 2015-03-06: qty 1

## 2015-03-06 MED ORDER — PANTOPRAZOLE SODIUM 40 MG PO TBEC
40.0000 mg | DELAYED_RELEASE_TABLET | Freq: Every day | ORAL | Status: DC
  Administered 2015-03-07 – 2015-03-08 (×2): 40 mg via ORAL
  Filled 2015-03-06 (×3): qty 1

## 2015-03-06 MED ORDER — ALBUTEROL SULFATE (2.5 MG/3ML) 0.083% IN NEBU: 2.5000 mg | INHALATION_SOLUTION | RESPIRATORY_TRACT | Status: DC | PRN

## 2015-03-06 MED ORDER — METHYLPREDNISOLONE SODIUM SUCC 125 MG IJ SOLR
INTRAMUSCULAR | Status: AC
  Administered 2015-03-06: 125 mg via INTRAVENOUS
  Filled 2015-03-06: qty 2

## 2015-03-06 MED ORDER — GUAIFENESIN ER 600 MG PO TB12
1200.0000 mg | ORAL_TABLET | Freq: Two times a day (BID) | ORAL | Status: DC
  Administered 2015-03-06 – 2015-03-08 (×5): 1200 mg via ORAL
  Filled 2015-03-06 (×5): qty 2

## 2015-03-06 NOTE — H&P (Addendum)
[facility] [place] NAME: [name]    MR#:  [id number]  DATE OF BIRTH:  [birth year]  DATE OF ADMISSION:  03/06/2015  PRIMARY CARE PHYSICIAN: Madelyn Brunner, MD   REQUESTING/REFERRING PHYSICIAN:   CHIEF COMPLAINT:   Chief Complaint  Patient presents with  . Shortness of Breath    x 2 days    HISTORY OF PRESENT ILLNESS: [name]  is a 69 y.o. female with a known history of COPD, not on home oxygen, history of hypertension, hypothyroidism, patient presents with complaints of shortness of breath over the last 2 days, patient was hypoxic in ED with 82% saturation on room air, patient used her inhaler multiple times at home without relief, as well she admits to cough with productive sputum, she received IV steroids in ED, nebulizer treatment, chest x-ray does not show any acute cardiopulmonary disease, she denies any fever, chills, nausea, vomiting, dysuria or polyuria, hospitalist requested to admit the patient .   PAST MEDICAL HISTORY:   Past Medical History  Diagnosis Date  . COPD (chronic obstructive pulmonary disease)   . Murmur   . Scoliosis   . Hypertension   . Bronchitis   . Asthma   . Degenerative disc disease, lumbar   . Thyroid disease   . Back pain   . FHx: cholecystectomy   . H/O: hysterectomy   . Allergy     PAST SURGICAL HISTORY:  Past Surgical History  Procedure Laterality Date  . Rotator cuff repair Right   . Abdominal hysterectomy    . Cholecystectomy    . Shoulder open rotator cuff repair Right   . Shoulder surgery Left     replacement    SOCIAL HISTORY:  History  Substance Use Topics  . Smoking status: Current Every Day Smoker -- 1.00 packs/day    Types: Cigarettes  . Smokeless tobacco: Not on file  . Alcohol Use: No    FAMILY HISTORY:  Family History  Problem Relation Age of Onset  . Kidney disease Father   . Heart disease Mother   . Diabetes Mother     DRUG ALLERGIES:   Allergies  Allergen Reactions  . Adhesive [Tape] Other (See Comments)  . Other Rash    Plastic tape    REVIEW OF SYSTEMS:   CONSTITUTIONAL: No fever, fatigue or weakness.  EYES: No blurred or double vision.  EARS, NOSE, AND THROAT: No tinnitus or ear pain.  RESPIRATORY:  patient reports cough, shortness of breath, wheezing, denies any hemoptysis. CARDIOVASCULAR: No chest pain, orthopnea, edema.  GASTROINTESTINAL: No nausea, vomiting, diarrhea or abdominal pain.  GENITOURINARY: No dysuria, hematuria.  ENDOCRINE: No polyuria, nocturia,  HEMATOLOGY: No anemia, easy bruising or bleeding SKIN: No rash or lesion. MUSCULOSKELETAL: No joint pain or arthritis.   NEUROLOGIC: No tingling, numbness, weakness.  PSYCHIATRY: No anxiety or depression.   MEDICATIONS AT HOME:  Prior to Admission medications   Medication Sig Start Date End Date Taking? Authorizing Provider  alendronate (FOSAMAX) 70 MG tablet Take 70 mg by mouth once a week. Take with a full glass of water on an empty stomach.   Yes Historical Provider, MD  amLODipine (NORVASC) 5 MG tablet Take 5 mg by mouth daily.   Yes Historical Provider, MD  citalopram (CELEXA) 20 MG tablet Take 20 mg by mouth daily.   Yes Historical Provider, MD  gabapentin (NEURONTIN) 300 MG capsule Take 300 mg by mouth 2 (two) times daily. 2  capsules bid   Yes Historical Provider, MD  levothyroxine (SYNTHROID, LEVOTHROID) 112 MCG tablet Take 112 mcg by mouth daily before breakfast.   Yes Historical Provider, MD  metoprolol (LOPRESSOR) 100 MG tablet Take 100 mg by mouth 2 (two) times daily.   Yes Historical Provider, MD  naproxen (NAPROSYN) 375 MG tablet Take 375 mg by mouth 2 (two) times daily with a meal.   Yes Historical Provider, MD  omeprazole (PRILOSEC) 20 MG capsule Take 20 mg by mouth daily.   Yes Historical Provider, MD  oxycodone (OXY-IR) 5 MG capsule LIMIT 1 TAB PO BID - QID IF TOLERATED 03/04/15  Yes Mohammed Kindle, MD  potassium chloride (KLOR-CON)  20 MEQ packet Take 20 mEq by mouth once.    Yes Historical Provider, MD  methocarbamol (ROBAXIN) 500 MG tablet Take 500 mg by mouth 4 (four) times daily.    Historical Provider, MD      PHYSICAL EXAMINATION:   VITAL SIGNS: Blood pressure 110/49, pulse 74, temperature 98.4 F (36.9 C), temperature source Oral, resp. rate 18, height '4\' 9"'$  (1.448 m), weight 84.369 kg (186 lb), SpO2 91 %.  GENERAL:  69 y.o.-year-old patient lying in the bed with no acute distress.  EYES: Pupils equal, round, reactive to light and accommodation. No scleral icterus. Extraocular muscles intact.  HEENT: Head atraumatic, normocephalic. Oropharynx and nasopharynx clear.  NECK:  Supple, no jugular venous distention. No thyroid enlargement, no tenderness.  LUNGS: , In mild respiratory distress, scattered wheezing with bibasilar Rales. CARDIOVASCULAR: S1, S2 normal. No murmurs, rubs, or gallops.  ABDOMEN: Soft, nontender, nondistended. Bowel sounds present. No organomegaly or mass.  EXTREMITIES: No pedal edema, cyanosis, or clubbing.  NEUROLOGIC: Cranial nerves II through XII are intact. Muscle strength 5/5 in all extremities. Sensation intact. Gait not checked.  PSYCHIATRIC: The patient is alert and oriented x 3.  SKIN: No obvious rash, lesion, or ulcer.   LABORATORY PANEL:   CBC  Recent Labs Lab 03/06/15 0754  WBC 17.7*  HGB 11.4*  HCT 35.0  PLT 219  MCV 84.7  MCH 27.6  MCHC 32.6  RDW 15.7*   ------------------------------------------------------------------------------------------------------------------  Chemistries   Recent Labs Lab 03/06/15 0754  NA 130*  K 3.8  CL 98*  CO2 25  GLUCOSE 98  BUN 7  CREATININE 0.56  CALCIUM 8.5*  AST 17  ALT 12*  ALKPHOS 72  BILITOT 0.6   ------------------------------------------------------------------------------------------------------------------ estimated creatinine clearance is 61.3 mL/min (by C-G formula based on Cr of  0.56). ------------------------------------------------------------------------------------------------------------------ No results for input(s): TSH, T4TOTAL, T3FREE, THYROIDAB in the last 72 hours.  Invalid input(s): FREET3   Coagulation profile No results for input(s): INR, PROTIME in the last 168 hours. ------------------------------------------------------------------------------------------------------------------- No results for input(s): DDIMER in the last 72 hours. -------------------------------------------------------------------------------------------------------------------  Cardiac Enzymes  Recent Labs Lab 03/06/15 0754  TROPONINI <0.03   ------------------------------------------------------------------------------------------------------------------ Invalid input(s): POCBNP  ---------------------------------------------------------------------------------------------------------------  Urinalysis No results found for: COLORURINE, APPEARANCEUR, LABSPEC, PHURINE, GLUCOSEU, HGBUR, BILIRUBINUR, KETONESUR, PROTEINUR, UROBILINOGEN, NITRITE, LEUKOCYTESUR   RADIOLOGY: Dg Chest 2 View  03/06/2015   CLINICAL DATA:  New onset of shob pt with hx of asthma  EXAM: CHEST  2 VIEW  COMPARISON:  11/05/2014  FINDINGS: Cardiac silhouette normal in size and configuration. There is a moderate size hiatal hernia. Mediastinum otherwise normal in contour. No hilar masses or evidence of adenopathy.  Mild reticular opacities at the lung bases consistent with atelectasis or scarring or a combination. Mild scarring noted in the upper lobes at the apices.  No lung consolidation or edema. No pleural effusion or pneumothorax.  Left shoulder prosthesis is well aligned. There are changes from a cervical spine fusion. Bony thorax is diffusely demineralized there is a focal kyphosis at the thoracolumbar junction where there which should compression deformities. These findings are all stable.  IMPRESSION:  No acute cardiopulmonary disease.   Electronically Signed   By: Lajean Manes M.D.   On: 03/06/2015 07:45    EKG: Orders placed or performed during the hospital encounter of 03/06/15  . ED EKG  . ED EKG    IMPRESSION AND PLAN:  Acute hypoxic respiratory failure - This is secondary to COPD, with no evidence of pneumonia on chest x-ray, and hypoxic in ED, keep her on oxygen as needed, pulmonary eyelid, flutter valve, nebs.  COPD exacerbation - We'll start on IV Solu-Medrol 60 mg IV every 6 hours, DuoNeb every 6 hours, when necessary albuterol, as well will start her on antibiotics Rocephin and azithromycin secondary to cough with obstructive sputum, will taper steroids gradually as she is improving.  Hypertension - Blood pressure is on the lower side, will hold antihypertensive medication  Hypothyroidism - Continue with Synthroid  Smoking - councelled for 4 min to quit.   All the records are reviewed and case discussed with ED provider. Management plans discussed with the patient, family and they are in agreement.  CODE STATUS:Full    TOTAL TIME TAKING CARE OF THIS PATIENT: 60 minutes minutes.    Vaughan Basta M.D on 03/06/2015 at 10:49 AM  Between 7am to 6pm - Pager - [phone]  After 6pm go to [email or web address] - password [email or web address] [facility]  Office  [phone]  CC: Primary care physician; Madelyn Brunner, MD

## 2015-03-06 NOTE — ED Notes (Signed)
Admitting MD at bedside.

## 2015-03-06 NOTE — ED Notes (Signed)
Feeling tight and SOB x 2 days, using inhaler without relief, appears mildly labored at rest

## 2015-03-06 NOTE — ED Provider Notes (Signed)
[facility] Emergency Department Provider Note  ____________________________________________  Time seen: 7:50 AM  I have reviewed the triage vital signs and the nursing notes.   HISTORY  Chief Complaint Shortness of Breath      HPI [name] is a 69 y.o. female who presents with shortness of breath for 2 days. Patient has a history of COPD and believes that she is having an exacerbation which she categorizes as moderate. She does feel some chest tightness and does have cough.She is not on home oxygen. She has tried her inhaler at home without relief. No fevers chills. No nausea vomiting or diaphoresis. She has had similar symptoms in the past     Past Medical History  Diagnosis Date  . COPD (chronic obstructive pulmonary disease)   . Murmur   . Scoliosis   . Hypertension   . Bronchitis   . Asthma   . Degenerative disc disease, lumbar   . Thyroid disease   . Back pain   . FHx: cholecystectomy   . H/O: hysterectomy   . Allergy     Patient Active Problem List   Diagnosis Date Noted  . DDD (degenerative disc disease), cervical 03/04/2015  . DDD (degenerative disc disease), lumbar 03/04/2015  . DJD (degenerative joint disease) of knee 03/04/2015    Past Surgical History  Procedure Laterality Date  . Rotator cuff repair Right   . Abdominal hysterectomy    . Cholecystectomy    . Shoulder open rotator cuff repair Right   . Shoulder surgery Left     replacement    Current Outpatient Rx  Name  Route  Sig  Dispense  Refill  . alendronate (FOSAMAX) 70 MG tablet   Oral   Take 70 mg by mouth once a week. Take with a full glass of water on an empty stomach.         Marland Kitchen amLODipine (NORVASC) 5 MG tablet   Oral   Take 5 mg by mouth daily.         . citalopram (CELEXA) 20 MG tablet   Oral   Take 20 mg by mouth daily.         Marland Kitchen gabapentin (NEURONTIN) 300 MG capsule   Oral   Take 300 mg by mouth 2 (two) times daily. 2 capsules bid          . levothyroxine (SYNTHROID, LEVOTHROID) 112 MCG tablet   Oral   Take 112 mcg by mouth daily before breakfast.         . methocarbamol (ROBAXIN) 500 MG tablet   Oral   Take 500 mg by mouth 4 (four) times daily.         . metoprolol (LOPRESSOR) 100 MG tablet   Oral   Take 100 mg by mouth 2 (two) times daily.         . naproxen (NAPROSYN) 375 MG tablet   Oral   Take 375 mg by mouth 2 (two) times daily with a meal.         . omeprazole (PRILOSEC) 20 MG capsule   Oral   Take 20 mg by mouth daily.         Marland Kitchen oxycodone (OXY-IR) 5 MG capsule      LIMIT 1 TAB PO BID - QID IF TOLERATED   120 capsule   0   . potassium chloride (KLOR-CON) 20 MEQ packet   Oral   Take 20 mEq by mouth once.  Allergies Adhesive and Other  Family History  Problem Relation Age of Onset  . Kidney disease Father   . Heart disease Mother   . Diabetes Mother     Social History History  Substance Use Topics  . Smoking status: Current Every Day Smoker -- 1.00 packs/day    Types: Cigarettes  . Smokeless tobacco: Not on file  . Alcohol Use: No    Review of Systems  Constitutional: Negative for fever. Eyes: Negative for visual changes. ENT: Negative for sore throat. Cardiovascular: Negative for chest pain. Passive for tightness Respiratory: Positive for shortness of breath Gastrointestinal: Negative for abdominal pain, vomiting and diarrhea. Genitourinary: Negative for dysuria. Musculoskeletal: Negative for back pain. Skin: Negative for rash. Neurological: Negative for headaches, focal weakness or numbness. Psychiatric: No anxiety or depression  10-point ROS otherwise negative.  ____________________________________________   PHYSICAL EXAM:  VITAL SIGNS: ED Triage Vitals  Enc Vitals Group     BP 03/06/15 0717 95/76 mmHg     Pulse --      Resp --      Temp 03/06/15 0717 98.4 F (36.9 C)     Temp Source 03/06/15 0717 Oral     SpO2 03/06/15 0717 92 %      Weight 03/06/15 0717 186 lb (84.369 kg)     Height 03/06/15 0717 '4\' 9"'$  (1.448 m)     Head Cir --      Peak Flow --      Pain Score 03/06/15 0719 5     Pain Loc --      Pain Edu? --      Excl. in [place]? --      Constitutional: Alert and oriented. Well appearing and in no distress. Eyes: Conjunctivae are normal. PERRL. Normal extraocular movements. ENT   Head: Normocephalic and atraumatic.   Nose: No congestion/rhinnorhea.   Mouth/Throat: Mucous membranes are moist.   Neck: No stridor. Hematological/Lymphatic/Immunilogical: No cervical lymphadenopathy. Cardiovascular: Normal rate, regular rhythm. Normal and symmetric distal pulses are present in all extremities. No murmurs, rubs, or gallops. Respiratory: Mildly increased respiratory effort with prolonged exhalation. Positive wheezes bilateral bases Gastrointestinal: Soft and nontender. No distention. There is no CVA tenderness. Genitourinary: deferred Musculoskeletal: Nontender with normal range of motion in all extremities. No joint effusions.  No lower extremity tenderness nor edema. No calf tenderness to palpation or swelling Neurologic:  Normal speech and language. No gross focal neurologic deficits are appreciated. Speech is normal.  Skin:  Skin is warm, dry and intact. No rash noted. Psychiatric: Mood and affect are normal. Speech and behavior are normal. Patient exhibits appropriate insight and judgment.  ____________________________________________    LABS (pertinent positives/negatives)  Elevated white blood cell count noted possibly related to increased work of breathing versus URI, no evidence of pneumonia on chest x-ray  ____________________________________________   EKG  ED ECG REPORT   Date: 03/06/2015  EKG Time: 7:26 AM  Rate: 73  Rhythm: normal sinus rhythm, left axis deviation  Axis: Left axis deviation  Intervals:nonspecific intraventricular conduction delay  ST&T Change:  Nonspecific   ____________________________________________    RADIOLOGY  chest x-ray no acute distress  ____________________________________________   PROCEDURES  Procedure(s) performed: None  Critical Care performed: No    ____________________________________________   INITIAL IMPRESSION / ASSESSMENT AND PLAN / ED COURSE  Pertinent labs & imaging results that were available during my care of the patient were reviewed by me and considered in my medical decision making (see chart for details).  Initial impression  consistent with COPD exacerbation. We'll do albuterol nebulizers and IV Solu-Medrol while we wait for chest x-ray and lab work.   ----------------------------------------- 10:13 AM on 03/06/2015 -----------------------------------------  After treatment and off oxygen patient had O2 sat of 83%. Nasal cannula started will require admission for COPD exacerbation   Final Impression  COPD Exacerbation  Lavonia Drafts, MD 03/06/15 1014

## 2015-03-06 NOTE — ED Notes (Signed)
Patient c.o increased SOB. Patient states she was recently treated for Bronchitis and was taking Levaquin. Patient states she uses Proventin each day for her COPD. Patient states she has noted a continuous increase in SOB.

## 2015-03-07 LAB — BASIC METABOLIC PANEL
Anion gap: 9 (ref 5–15)
BUN: 12 mg/dL (ref 6–20)
CO2: 25 mmol/L (ref 22–32)
Calcium: 8.8 mg/dL — ABNORMAL LOW (ref 8.9–10.3)
Chloride: 100 mmol/L — ABNORMAL LOW (ref 101–111)
Creatinine, Ser: 0.68 mg/dL (ref 0.44–1.00)
GFR calc Af Amer: >60 mL/min (ref >60)
GFR calc non Af Amer: >60 mL/min (ref >60)
Glucose, Bld: 169 mg/dL — ABNORMAL HIGH (ref 65–99)
Potassium: 3.6 mmol/L (ref 3.5–5.1)
Sodium: 134 mmol/L — ABNORMAL LOW (ref 135–145)

## 2015-03-07 LAB — CBC
HCT: 37.5 % (ref 35.0–47.0)
Hemoglobin: 12 g/dL (ref 12.0–16.0)
MCH: 27.3 pg (ref 26.0–34.0)
MCHC: 31.9 g/dL — ABNORMAL LOW (ref 32.0–36.0)
MCV: 85.6 fL (ref 80.0–100.0)
Platelets: 247 10*3/uL (ref 150–440)
RBC: 4.38 MIL/uL (ref 3.80–5.20)
RDW: 15.6 % — ABNORMAL HIGH (ref 11.5–14.5)
WBC: 22.9 10*3/uL — ABNORMAL HIGH (ref 3.6–11.0)

## 2015-03-07 MED ORDER — SODIUM CHLORIDE 0.9 % IV SOLN
INTRAVENOUS | Status: DC
  Administered 2015-03-07 – 2015-03-08 (×2): via INTRAVENOUS

## 2015-03-07 MED ORDER — ALBUTEROL SULFATE (2.5 MG/3ML) 0.083% IN NEBU
2.5000 mg | INHALATION_SOLUTION | Freq: Three times a day (TID) | RESPIRATORY_TRACT | Status: DC
  Administered 2015-03-08: 2.5 mg via RESPIRATORY_TRACT
  Filled 2015-03-07: qty 3

## 2015-03-07 MED ORDER — DIPHENHYDRAMINE HCL 25 MG PO CAPS
25.0000 mg | ORAL_CAPSULE | Freq: Four times a day (QID) | ORAL | Status: DC | PRN
  Administered 2015-03-07: 25 mg via ORAL
  Filled 2015-03-07: qty 1

## 2015-03-07 MED ORDER — CEFTRIAXONE SODIUM IN DEXTROSE 20 MG/ML IV SOLN
1.0000 g | INTRAVENOUS | Status: DC
  Administered 2015-03-07: 1 g via INTRAVENOUS
  Filled 2015-03-07: qty 50

## 2015-03-07 MED ORDER — IPRATROPIUM BROMIDE 0.02 % IN SOLN
0.5000 mg | Freq: Three times a day (TID) | RESPIRATORY_TRACT | Status: DC
  Administered 2015-03-08: 0.5 mg via RESPIRATORY_TRACT
  Filled 2015-03-07: qty 2.5

## 2015-03-07 NOTE — Progress Notes (Signed)
Dr. Bridgett Larsson was paged and notified that patient was complaining of itching of her neck, face, chest and upper body.  She has received 1 gm of rocephin in the last hour.  An order to hold the Rocephin was given over the telephone and an order for benadryl '25mg'$  orally every 6 hours prn was also given.

## 2015-03-07 NOTE — Progress Notes (Signed)
[place] [facility] [place] NAME: [name]    MR#:  [id number]  DATE OF BIRTH:  [birth year]  SUBJECTIVE:  CHIEF COMPLAINT:   Chief Complaint  Patient presents with  . Shortness of Breath    x 2 days   Feeling better this morning. Still slightly short of breath, but improving. Weak.   REVIEW OF SYSTEMS:  Review of Systems  Constitutional: Negative for fever and chills.  Respiratory: Positive for cough, sputum production and shortness of breath. Negative for hemoptysis.   Cardiovascular: Negative for chest pain and palpitations.  Gastrointestinal: Negative for heartburn and abdominal pain.  Genitourinary: Negative for dysuria.  Skin: Negative for rash.  Neurological: Negative for focal weakness and headaches.    DRUG ALLERGIES:   Allergies  Allergen Reactions  . Adhesive [Tape] Other (See Comments)  . Other Rash    Plastic tape    VITALS:  Blood pressure 100/65, pulse 84, temperature 98.1 F (36.7 C), temperature source Oral, resp. rate 19, height '4\' 9"'$  (1.448 m), weight 84.369 kg (186 lb), SpO2 95 %.  PHYSICAL EXAMINATION:  Physical Exam  Constitutional: She is oriented to person, place, and time. She appears well-developed and well-nourished.  HENT:  Head: Normocephalic and atraumatic.  Eyes: EOM are normal. Pupils are equal, round, and reactive to light.  Neck: Normal range of motion.  Cardiovascular: Normal rate and regular rhythm.   No murmur heard. Pulmonary/Chest: Effort normal. No respiratory distress. She has wheezes. She has no rales. She exhibits no tenderness.  Abdominal: Soft. Bowel sounds are normal.  Musculoskeletal: Normal range of motion. She exhibits no edema.  Lymphadenopathy:    She has no cervical adenopathy.  Neurological: She is oriented to person, place, and time.  Skin: Skin is warm and dry.       LABORATORY PANEL:   CBC  Recent Labs Lab 03/07/15 0818  WBC 22.9*  HGB 12.0  HCT  37.5  PLT 247   ------------------------------------------------------------------------------------------------------------------  Chemistries   Recent Labs Lab 03/06/15 0754 03/07/15 0818  NA 130* 134*  K 3.8 3.6  CL 98* 100*  CO2 25 25  GLUCOSE 98 169*  BUN 7 12  CREATININE 0.56 0.68  CALCIUM 8.5* 8.8*  AST 17  --   ALT 12*  --   ALKPHOS 72  --   BILITOT 0.6  --    ------------------------------------------------------------------------------------------------------------------  Cardiac Enzymes  Recent Labs Lab 03/06/15 0754  TROPONINI <0.03   ------------------------------------------------------------------------------------------------------------------  RADIOLOGY:  Dg Chest 2 View  03/06/2015   CLINICAL DATA:  New onset of shob pt with hx of asthma  EXAM: CHEST  2 VIEW  COMPARISON:  11/05/2014  FINDINGS: Cardiac silhouette normal in size and configuration. There is a moderate size hiatal hernia. Mediastinum otherwise normal in contour. No hilar masses or evidence of adenopathy.  Mild reticular opacities at the lung bases consistent with atelectasis or scarring or a combination. Mild scarring noted in the upper lobes at the apices.  No lung consolidation or edema. No pleural effusion or pneumothorax.  Left shoulder prosthesis is well aligned. There are changes from a cervical spine fusion. Bony thorax is diffusely demineralized there is a focal kyphosis at the thoracolumbar junction where there which should compression deformities. These findings are all stable.  IMPRESSION: No acute cardiopulmonary disease.   Electronically Signed   By: Lajean Manes M.D.   On: 03/06/2015 07:45    EKG:   Orders placed or performed during  the hospital encounter of 03/06/15  . ED EKG  . ED EKG    ASSESSMENT AND PLAN:    IMPRESSION AND PLAN:  Acute hypoxic respiratory failure: resolved - due to COPD exac - CXR clear  COPD exacerbation - transition to PO steriods - continue  duoNeb every 6 hours,  - continue rocephin and azithromycin secondary to cough with obstructive sputum  Hypertension - Blood pressure is on the lower side - start fluids, hold antihypertensive medication  Hypothyroidism - Continue with Synthroid  Smoking - councelled for 4 min to quit during admission       All the records are reviewed and case discussed with Care Management/Social Workerr. Management plans discussed with the patient, family and they are in agreement.  CODE STATUS: full  TOTAL TIME TAKING CARE OF THIS PATIENT: 25 minutes.    Myrtis Ser M.D on 03/07/2015 at 2:33 PM  Between 7am to 6pm - Pager - [phone]  After 6pm go to [email or web address] - password [email or web address] [facility]  Office  [phone]  CC: Primary care physician; Madelyn Brunner, MD

## 2015-03-08 DIAGNOSIS — Z720 Tobacco use: Secondary | ICD-10-CM | POA: Diagnosis present

## 2015-03-08 MED ORDER — AZITHROMYCIN 250 MG PO TABS: ORAL_TABLET | ORAL | Status: DC

## 2015-03-08 MED ORDER — PREDNISONE 50 MG PO TABS: ORAL_TABLET | ORAL | Status: DC

## 2015-03-08 MED ORDER — TIOTROPIUM BROMIDE MONOHYDRATE 18 MCG IN CAPS: 18.0000 ug | ORAL_CAPSULE | Freq: Every morning | RESPIRATORY_TRACT | Status: DC

## 2015-03-08 MED ORDER — GUAIFENESIN ER 600 MG PO TB12: 1200.0000 mg | ORAL_TABLET | Freq: Two times a day (BID) | ORAL | Status: DC

## 2015-03-08 MED ORDER — IPRATROPIUM-ALBUTEROL 0.5-2.5 (3) MG/3ML IN SOLN: 3.0000 mL | Freq: Four times a day (QID) | RESPIRATORY_TRACT | Status: DC

## 2015-03-08 NOTE — Discharge Instructions (Signed)
°  DIET:  Cardiac diet  DISCHARGE CONDITION:  Fair  ACTIVITY:  Activity as tolerated  OXYGEN:  Home Oxygen: No.   Oxygen Delivery: room air  DISCHARGE LOCATION:  home   If you experience worsening of your admission symptoms, develop shortness of breath, life threatening emergency, suicidal or homicidal thoughts you must seek medical attention immediately by calling 911 or calling your MD immediately  if symptoms less severe.  You Must read complete instructions/literature along with all the possible adverse reactions/side effects for all the Medicines you take and that have been prescribed to you. Take any new Medicines after you have completely understood and accpet all the possible adverse reactions/side effects.   Please note  You were cared for by a hospitalist during your hospital stay. If you have any questions about your discharge medications or the care you received while you were in the hospital after you are discharged, you can call the unit and asked to speak with the hospitalist on call if the hospitalist that took care of you is not available. Once you are discharged, your primary care physician will handle any further medical issues. Please note that NO REFILLS for any discharge medications will be authorized once you are discharged, as it is imperative that you return to your primary care physician (or establish a relationship with a primary care physician if you do not have one) for your aftercare needs so that they can reassess your need for medications and monitor your lab values.  Please contact your physician if you notice any worsening shortness of breath, difficulty breathing, or have any other questions or concerns.

## 2015-03-11 NOTE — Addendum Note (Signed)
Addended by: Dewayne Shorter on: 03/11/2015 11:13 AM   Modules accepted: Orders

## 2015-03-14 ENCOUNTER — Other Ambulatory Visit: Payer: Self-pay | Admitting: Pain Medicine

## 2015-03-14 ENCOUNTER — Encounter: Payer: Self-pay | Admitting: Pain Medicine

## 2015-03-14 DIAGNOSIS — M47816 Spondylosis without myelopathy or radiculopathy, lumbar region: Secondary | ICD-10-CM

## 2015-03-14 DIAGNOSIS — M533 Sacrococcygeal disorders, not elsewhere classified: Secondary | ICD-10-CM | POA: Insufficient documentation

## 2015-03-15 ENCOUNTER — Ambulatory Visit: Payer: PPO | Attending: Pain Medicine | Admitting: Pain Medicine

## 2015-03-15 VITALS — BP 147/99 | HR 55 | Temp 98.7°F | Resp 16 | Ht <= 58 in | Wt 190.0 lb

## 2015-03-15 DIAGNOSIS — M170 Bilateral primary osteoarthritis of knee: Secondary | ICD-10-CM | POA: Diagnosis present

## 2015-03-15 DIAGNOSIS — M172 Bilateral post-traumatic osteoarthritis of knee: Secondary | ICD-10-CM

## 2015-03-15 MED ORDER — CEFUROXIME AXETIL 250 MG PO TABS: 250.0000 mg | ORAL_TABLET | Freq: Two times a day (BID) | ORAL | Status: DC

## 2015-03-15 NOTE — Progress Notes (Signed)
Patient is 69 year old female returns to [facility] for treatment of severe pain of the knees felt to be due to degenerative joint disease of the knees. Patient with bone on bone condition of the knees with severely disabling pain and inability to ambulate due to severe pain of the knees wishes to proceed with right knee injection in attempt to decrease severity of symptoms, minimize progression of symptoms, and hopefully avoid the need for more involved treatment. The risks benefits and expectations of procedure discussed and explained the patient who was understanding and in agreement with suggested treatment plan decision made to proceed with right knee injection.  Right knee injection  Patient supine with head of bed elevated 45 and knee flexed  EKG blood pressure pulse and pulse oximetry monitoring in place. Betadine prep proposed entry site accomplished  Right knee injection medial approach 22-gauge needle was inserted inferior and medial to the patella. A total of 2 cc of quarter percent bupivacaine and Depo-Medrol was injected incrementally. Needle was removed  Right knee injection lateral approach 22-gauge needle was inserted inferior and lateral to the patella. A total of 2 cc of quarter percent bupivacaine and Depo-Medrol was injected incrementally. Needle was removed  40 mg Depo-Medrol injected for procedure  Plan  Continue present medications and antibioticcs  F/U PCP for evaliation of  BP and general medical  condition.  F/U surgical evaluation.  F/U nrurological evaluation.  May consider radiofrequency rhizolysis or intraspinal procedures pending response to present treatment and F/U evaluation.  Patient to call [facility] should patient have concerns prior to scheduled return appointment.

## 2015-03-15 NOTE — Patient Instructions (Addendum)
Continue present medications and antibioticcs  F/U PCP for evaliation of  BP and general medical  condition.  F/U surgical evaluation.  F/U nrurological evaluation.  May consider radiofrequency rhizolysis or intraspinal procedures pending response to present treatment and F/U evaluation.  Patient to call [facility] should patient have concerns prior to scheduled return appointment.  Pain Management Discharge Instructions  General Discharge Instructions :  If you need to reach your doctor call: Monday-Friday 8:00 am - 4:00 pm at [phone] or toll free [phone].  After clinic hours [phone] to have operator reach doctor.  Bring all of your medication bottles to all your appointments in the pain clinic.  To cancel or reschedule your appointment with Pain Management please remember to call 24 hours in advance to avoid a fee.  Refer to the educational materials which you have been given on: General Risks, I had my Procedure. Discharge Instructions, Post Sedation.  Post Procedure Instructions:  The drugs you were given will stay in your system until tomorrow, so for the next 24 hours you should not drive, make any legal decisions or drink any alcoholic beverages.  You may eat anything you prefer, but it is better to start with liquids then soups and crackers, and gradually work up to solid foods.  Please notify your doctor immediately if you have any unusual bleeding, trouble breathing or pain that is not related to your normal pain.  Depending on the type of procedure that was done, some parts of your body may feel week and/or numb.  This usually clears up by tonight or the next day.  Walk with the use of an assistive device or accompanied by an adult for the 24 hours.  You may use ice on the affected area for the first 24 hours.  Put ice in a Ziploc bag and cover with a towel and place against area 15 minutes on 15 minutes off.  You may switch to heat after 24  hours.GENERAL RISKS AND COMPLICATIONS  What are the risk, side effects and possible complications? Generally speaking, most procedures are safe.  However, with any procedure there are risks, side effects, and the possibility of complications.  The risks and complications are dependent upon the sites that are lesioned, or the type of nerve block to be performed.  The closer the procedure is to the spine, the more serious the risks are.  Great care is taken when placing the radio frequency needles, block needles or lesioning probes, but sometimes complications can occur. 1. Infection: Any time there is an injection through the skin, there is a risk of infection.  This is why sterile conditions are used for these blocks.  There are four possible types of infection. 1. Localized skin infection. 2. Central Nervous System Infection-This can be in the form of Meningitis, which can be deadly. 3. Epidural Infections-This can be in the form of an epidural abscess, which can cause pressure inside of the spine, causing compression of the spinal cord with subsequent paralysis. This would require an emergency surgery to decompress, and there are no guarantees that the patient would recover from the paralysis. 4. Discitis-This is an infection of the intervertebral discs.  It occurs in about 1% of discography procedures.  It is difficult to treat and it may lead to surgery.        2. Pain: the needles have to go through skin and soft tissues, will cause soreness.       3. Damage to internal structures:  The nerves to be lesioned may be near blood vessels or    other nerves which can be potentially damaged.       4. Bleeding: Bleeding is more common if the patient is taking blood thinners such as  aspirin, Coumadin, Ticiid, Plavix, etc., or if he/she have some genetic predisposition  such as hemophilia. Bleeding into the spinal canal can cause compression of the spinal  cord with subsequent paralysis.  This would  require an emergency surgery to  decompress and there are no guarantees that the patient would recover from the  paralysis.       5. Pneumothorax:  Puncturing of a lung is a possibility, every time a needle is introduced in  the area of the chest or upper back.  Pneumothorax refers to free air around the  collapsed lung(s), inside of the thoracic cavity (chest cavity).  Another two possible  complications related to a similar event would include: Hemothorax and Chylothorax.   These are variations of the Pneumothorax, where instead of air around the collapsed  lung(s), you may have blood or chyle, respectively.       6. Spinal headaches: They may occur with any procedures in the area of the spine.       7. Persistent CSF (Cerebro-Spinal Fluid) leakage: This is a rare problem, but may occur  with prolonged intrathecal or epidural catheters either due to the formation of a fistulous  track or a dural tear.       8. Nerve damage: By working so close to the spinal cord, there is always a possibility of  nerve damage, which could be as serious as a permanent spinal cord injury with  paralysis.       9. Death:  Although rare, severe deadly allergic reactions known as "Anaphylactic  reaction" can occur to any of the medications used.      10. Worsening of the symptoms:  We can always make thing worse.  What are the chances of something like this happening? Chances of any of this occuring are extremely low.  By statistics, you have more of a chance of getting killed in a motor vehicle accident: while driving to the hospital than any of the above occurring .  Nevertheless, you should be aware that they are possibilities.  In general, it is similar to taking a shower.  Everybody knows that you can slip, hit your head and get killed.  Does that mean that you should not shower again?  Nevertheless always keep in mind that statistics do not mean anything if you happen to be on the wrong side of them.  Even if a procedure  has a 1 (one) in a 1,000,000 (million) chance of going wrong, it you happen to be that one..Also, keep in mind that by statistics, you have more of a chance of having something go wrong when taking medications.  Who should not have this procedure? If you are on a blood thinning medication (e.g. Coumadin, Plavix, see list of "Blood Thinners"), or if you have an active infection going on, you should not have the procedure.  If you are taking any blood thinners, please inform your physician.  How should I prepare for this procedure?  Do not eat or drink anything at least six hours prior to the procedure.  Bring a driver with you .  It cannot be a taxi.  Come accompanied by an adult that can drive you back, and that is strong enough to help  you if your legs get weak or numb from the local anesthetic.  Take all of your medicines the morning of the procedure with just enough water to swallow them.  If you have diabetes, make sure that you are scheduled to have your procedure done first thing in the morning, whenever possible.  If you have diabetes, take only half of your insulin dose and notify our nurse that you have done so as soon as you arrive at the clinic.  If you are diabetic, but only take blood sugar pills (oral hypoglycemic), then do not take them on the morning of your procedure.  You may take them after you have had the procedure.  Do not take aspirin or any aspirin-containing medications, at least eleven (11) days prior to the procedure.  They may prolong bleeding.  Wear loose fitting clothing that may be easy to take off and that you would not mind if it got stained with Betadine or blood.  Do not wear any jewelry or perfume  Remove any nail coloring.  It will interfere with some of our monitoring equipment.  NOTE: Remember that this is not meant to be interpreted as a complete list of all possible complications.  Unforeseen problems may occur.  BLOOD THINNERS The following  drugs contain aspirin or other products, which can cause increased bleeding during surgery and should not be taken for 2 weeks prior to and 1 week after surgery.  If you should need take something for relief of minor pain, you may take acetaminophen which is found in Tylenol,m Datril, Anacin-3 and Panadol. It is not blood thinner. The products listed below are.  Do not take any of the products listed below in addition to any listed on your instruction sheet.  A.P.C or A.P.C with Codeine Codeine Phosphate Capsules #3 Ibuprofen Ridaura  ABC compound Congesprin Imuran rimadil  Advil Cope Indocin Robaxisal  Alka-Seltzer Effervescent Pain Reliever and Antacid Coricidin or Coricidin-D  Indomethacin Rufen  Alka-Seltzer plus Cold Medicine Cosprin Ketoprofen S-A-C Tablets  Anacin Analgesic Tablets or Capsules Coumadin Korlgesic Salflex  Anacin Extra Strength Analgesic tablets or capsules CP-2 Tablets Lanoril Salicylate  Anaprox Cuprimine Capsules Levenox Salocol  Anexsia-D Dalteparin Magan Salsalate  Anodynos Darvon compound Magnesium Salicylate Sine-off  Ansaid Dasin Capsules Magsal Sodium Salicylate  Anturane Depen Capsules Marnal Soma  APF Arthritis pain formula Dewitt's Pills Measurin Stanback  Argesic Dia-Gesic Meclofenamic Sulfinpyrazone  Arthritis Bayer Timed Release Aspirin Diclofenac Meclomen Sulindac  Arthritis pain formula Anacin Dicumarol Medipren Supac  Analgesic (Safety coated) Arthralgen Diffunasal Mefanamic Suprofen  Arthritis Strength Bufferin Dihydrocodeine Mepro Compound Suprol  Arthropan liquid Dopirydamole Methcarbomol with Aspirin Synalgos  ASA tablets/Enseals Disalcid Micrainin Tagament  Ascriptin Doan's Midol Talwin  Ascriptin A/D Dolene Mobidin Tanderil  Ascriptin Extra Strength Dolobid Moblgesic Ticlid  Ascriptin with Codeine Doloprin or Doloprin with Codeine Momentum Tolectin  Asperbuf Duoprin Mono-gesic Trendar  Aspergum Duradyne Motrin or Motrin IB Triminicin  Aspirin  plain, buffered or enteric coated Durasal Myochrisine Trigesic  Aspirin Suppositories Easprin Nalfon Trillsate  Aspirin with Codeine Ecotrin Regular or Extra Strength Naprosyn Uracel  Atromid-S Efficin Naproxen Ursinus  Auranofin Capsules Elmiron Neocylate Vanquish  Axotal Emagrin Norgesic Verin  Azathioprine Empirin or Empirin with Codeine Normiflo Vitamin E  Azolid Emprazil Nuprin Voltaren  Bayer Aspirin plain, buffered or children's or timed BC Tablets or powders Encaprin Orgaran Warfarin Sodium  Buff-a-Comp Enoxaparin Orudis Zorpin  Buff-a-Comp with Codeine Equegesic Os-Cal-Gesic   Buffaprin Excedrin plain, buffered or Extra Strength Oxalid  Bufferin Arthritis Strength Feldene Oxphenbutazone   Bufferin plain or Extra Strength Feldene Capsules Oxycodone with Aspirin   Bufferin with Codeine Fenoprofen Fenoprofen Pabalate or Pabalate-SF   Buffets II Flogesic Panagesic   Buffinol plain or Extra Strength Florinal or Florinal with Codeine Panwarfarin   Buf-Tabs Flurbiprofen Penicillamine   Butalbital Compound Four-way cold tablets Penicillin   Butazolidin Fragmin Pepto-Bismol   Carbenicillin Geminisyn Percodan   Carna Arthritis Reliever Geopen Persantine   Carprofen Gold's salt Persistin   Chloramphenicol Goody's Phenylbutazone   Chloromycetin Haltrain Piroxlcam   Clmetidine heparin Plaquenil   Cllnoril Hyco-pap Ponstel   Clofibrate Hydroxy chloroquine Propoxyphen         Before stopping any of these medications, be sure to consult the physician who ordered them.  Some, such as Coumadin (Warfarin) are ordered to prevent or treat serious conditions such as "deep thrombosis", "pumonary embolisms", and other heart problems.  The amount of time that you may need off of the medication may also vary with the medication and the reason for which you were taking it.  If you are taking any of these medications, please make sure you notify your pain physician before you undergo any  procedures.

## 2015-03-15 NOTE — Progress Notes (Signed)
   Subjective:    Patient ID: [name], female    DOB: 1946/09/22, 69 y.o.   MRN: [id number]  HPI    Review of Systems     Objective:   Physical Exam        Assessment & Plan:

## 2015-03-15 NOTE — Progress Notes (Signed)
1400    Discharged to home via WC.

## 2015-03-15 NOTE — Progress Notes (Signed)
   Subjective:    Patient ID: [name], female    DOB: 1946/07/20, 69 y.o.   MRN: [id number]  HPI    Review of Systems     Objective:   Physical Exam        Assessment & Plan:

## 2015-03-16 ENCOUNTER — Telehealth: Payer: Self-pay | Admitting: *Deleted

## 2015-03-16 NOTE — Telephone Encounter (Signed)
Doing well. No problems.

## 2015-03-19 NOTE — Discharge Summary (Signed)
[facility] [place] NAME: [name]    MR#:  [id number]  DATE OF BIRTH:  Jan 29, 1946  DATE OF ADMISSION:  03/06/2015 ADMITTING PHYSICIAN: Vaughan Basta, MD  DATE OF DISCHARGE: 03/08/2015 12:40 PM  PRIMARY CARE PHYSICIAN: Madelyn Brunner, MD    ADMISSION DIAGNOSIS:  COPD exacerbation [J44.1]  DISCHARGE DIAGNOSIS:  Active Problems:   COPD exacerbation   HTN (hypertension)   Thyroid disease   Tobacco abuse   SECONDARY DIAGNOSIS:   Past Medical History  Diagnosis Date  . COPD (chronic obstructive pulmonary disease)   . Murmur   . Scoliosis   . Hypertension   . Bronchitis   . Asthma   . Degenerative disc disease, lumbar   . Thyroid disease   . Back pain   . FHx: cholecystectomy   . H/O: hysterectomy   . Allergy     HOSPITAL COURSE:    Acute hypoxic respiratory failure: resolved - due to COPD exac - CXR clear, no pneumonia  COPD exacerbation - stable on PO steriods - continue home inhalers and add spiriva - continue azithro on discharge  Hypertension - Blood pressure now stable. Resume home antihypertensives -  Hypothyroidism - Continue with Synthroid  Smoking - councelled to quit during admission      DISCHARGE CONDITIONS:   stable  CONSULTS OBTAINED:    None  DRUG ALLERGIES:   Allergies  Allergen Reactions  . Adhesive [Tape] Other (See Comments)  . Other Rash    Plastic tape    DISCHARGE MEDICATIONS:   Discharge Medication List as of 03/08/2015 11:53 AM    START taking these medications   Details  azithromycin (ZITHROMAX Z-PAK) 250 MG tablet Two tablets tomorrow and then one tablet daily until completed., Print    guaiFENesin (MUCINEX) 600 MG 12 hr tablet Take 2 tablets (1,200 mg total) by mouth 2 (two) times daily., Starting 03/08/2015, Until Discontinued, Print    predniSONE (DELTASONE) 50 MG tablet Take one tablet daily and then stop, Normal     tiotropium (SPIRIVA HANDIHALER) 18 MCG inhalation capsule Place 1 capsule (18 mcg total) into inhaler and inhale every morning., Starting 03/08/2015, Until Discontinued, Print      CONTINUE these medications which have NOT CHANGED   Details  alendronate (FOSAMAX) 70 MG tablet Take 70 mg by mouth once a week. Take with a full glass of water on an empty stomach. On Sunday, Until Discontinued, Historical Med    amLODipine (NORVASC) 5 MG tablet Take 5 mg by mouth daily., Until Discontinued, Historical Med    citalopram (CELEXA) 20 MG tablet Take 20 mg by mouth daily., Until Discontinued, Historical Med    gabapentin (NEURONTIN) 300 MG capsule Take 300 mg by mouth 4 (four) times daily. , Until Discontinued, Historical Med    levothyroxine (SYNTHROID, LEVOTHROID) 112 MCG tablet Take 112 mcg by mouth daily before breakfast., Until Discontinued, Historical Med    metoprolol (LOPRESSOR) 100 MG tablet Take 100 mg by mouth 2 (two) times daily., Until Discontinued, Historical Med    naproxen (NAPROSYN) 375 MG tablet Take 375 mg by mouth 2 (two) times daily with a meal., Until Discontinued, Historical Med    omeprazole (PRILOSEC) 20 MG capsule Take 20 mg by mouth daily., Until Discontinued, Historical Med    oxycodone (OXY-IR) 5 MG capsule LIMIT 1 TAB PO BID - QID IF TOLERATED, Print    potassium chloride SA (K-DUR,KLOR-CON) 20 MEQ tablet Take  20 mEq by mouth daily., Until Discontinued, Historical Med    methocarbamol (ROBAXIN) 500 MG tablet Take 500 mg by mouth 4 (four) times daily., Until Discontinued, Historical Med      STOP taking these medications     potassium chloride (KLOR-CON) 20 MEQ packet          DISCHARGE INSTRUCTIONS:    See AVS  If you experience worsening of your admission symptoms, develop shortness of breath, life threatening emergency, suicidal or homicidal thoughts you must seek medical attention immediately by calling 911 or calling your MD immediately  if symptoms  less severe.  You Must read complete instructions/literature along with all the possible adverse reactions/side effects for all the Medicines you take and that have been prescribed to you. Take any new Medicines after you have completely understood and accept all the possible adverse reactions/side effects.   Please note  You were cared for by a hospitalist during your hospital stay. If you have any questions about your discharge medications or the care you received while you were in the hospital after you are discharged, you can call the unit and asked to speak with the hospitalist on call if the hospitalist that took care of you is not available. Once you are discharged, your primary care physician will handle any further medical issues. Please note that NO REFILLS for any discharge medications will be authorized once you are discharged, as it is imperative that you return to your primary care physician (or establish a relationship with a primary care physician if you do not have one) for your aftercare needs so that they can reassess your need for medications and monitor your lab values.    Today   CHIEF COMPLAINT:   Chief Complaint  Patient presents with  . Shortness of Breath    x 2 days    HISTORY OF PRESENT ILLNESS:  [name] is a 69 y.o. female with a known history of COPD, not on home oxygen, history of hypertension, hypothyroidism, patient presents with complaints of shortness of breath over the last 2 days, patient was hypoxic in ED with 82% saturation on room air, patient used her inhaler multiple times at home without relief, as well she admits to cough with productive sputum, she received IV steroids in ED, nebulizer treatment, chest x-ray does not show any acute cardiopulmonary disease, she denies any fever, chills, nausea, vomiting, dysuria or polyuria, hospitalist requested to admit the patient .   VITAL SIGNS:  Blood pressure 149/75, pulse 85, temperature 98.7 F (37.1  C), temperature source Oral, resp. rate 18, height '4\' 9"'$  (1.448 m), weight 84.369 kg (186 lb), SpO2 93 %.  I/O:  No intake or output data in the 24 hours ending 03/19/15 1041  PHYSICAL EXAMINATION:  GENERAL:  69 y.o.-year-old patient lying in the bed with no acute distress.  EYES: Pupils equal, round, reactive to light and accommodation. No scleral icterus. Extraocular muscles intact.  HEENT: Head atraumatic, normocephalic. Oropharynx and nasopharynx clear.  NECK:  Supple, no jugular venous distention. No thyroid enlargement, no tenderness.  LUNGS: Normal breath sounds bilaterally, no wheezing, rales,rhonchi or crepitation. No use of accessory muscles of respiration.  CARDIOVASCULAR: S1, S2 normal. No murmurs, rubs, or gallops.  ABDOMEN: Soft, non-tender, non-distended. Bowel sounds present. No organomegaly or mass.  EXTREMITIES: No pedal edema, cyanosis, or clubbing.  NEUROLOGIC: Cranial nerves II through XII are intact. Muscle strength 5/5 in all extremities. Sensation intact. Gait not checked.  PSYCHIATRIC: The patient is  alert and oriented x 3.  SKIN: No obvious rash, lesion, or ulcer.   DATA REVIEW:   CBC No results for input(s): WBC, HGB, HCT, PLT in the last 168 hours.  Chemistries  No results for input(s): Breanna, K, CL, CO2, GLUCOSE, BUN, CREATININE, CALCIUM, MG, AST, ALT, ALKPHOS, BILITOT in the last 168 hours.  Invalid input(s): GFRCGP  Cardiac Enzymes No results for input(s): TROPONINI in the last 168 hours.  Microbiology Results  Results for orders placed or performed in visit on 02/27/13  Culture, blood (single)     Status: None   Collection Time: 02/26/13 11:19 PM  Result Value Ref Range Status   Micro Text Report   Final       COMMENT                   NO GROWTH AEROBICALLY/ANAEROBICALLY IN 5 DAYS   ANTIBIOTIC                                                      Culture, blood (single)     Status: None   Collection Time: 02/26/13 11:30 PM  Result Value Ref  Range Status   Micro Text Report   Final       COMMENT                   NO GROWTH AEROBICALLY/ANAEROBICALLY IN 5 DAYS   ANTIBIOTIC                                                        RADIOLOGY:  No results found.  EKG:   Orders placed or performed during the hospital encounter of 03/06/15  . ED EKG  . ED EKG  . EKG 12-Lead  . EKG 12-Lead  . EKG      Management plans discussed with the patient, family and they are in agreement.  CODE STATUS:  Advance Directive Documentation        Most Recent Value   Type of Advance Directive  Living will, Healthcare Power of Attorney   Pre-existing out of facility DNR order (yellow form or pink MOST form)     "MOST" Form in Place?        TOTAL TIME TAKING CARE OF THIS PATIENT: 35 minutes.    Myrtis Ser M.D on 03/19/2015 at 10:41 AM  Between 7am to 6pm - Pager - [phone]  After 6pm go to [email or web address] - password [email or web address]  Office  [phone]  CC: Primary care physician; Madelyn Brunner, MD

## 2015-03-24 ENCOUNTER — Other Ambulatory Visit: Payer: Self-pay | Admitting: Pain Medicine

## 2015-04-06 ENCOUNTER — Emergency Department
Admission: EM | Admit: 2015-04-06 | Discharge: 2015-04-06 | Disposition: A | Discharge status: Home / Self Care | Payer: PPO | Attending: Emergency Medicine | Admitting: Emergency Medicine

## 2015-04-06 ENCOUNTER — Emergency Department: Payer: PPO

## 2015-04-06 ENCOUNTER — Encounter: Payer: Self-pay | Admitting: Emergency Medicine

## 2015-04-06 DIAGNOSIS — Z720 Tobacco use: Secondary | ICD-10-CM | POA: Diagnosis not present

## 2015-04-06 DIAGNOSIS — Z79899 Other long term (current) drug therapy: Secondary | ICD-10-CM | POA: Diagnosis not present

## 2015-04-06 DIAGNOSIS — G8929 Other chronic pain: Secondary | ICD-10-CM | POA: Insufficient documentation

## 2015-04-06 DIAGNOSIS — I10 Essential (primary) hypertension: Secondary | ICD-10-CM | POA: Insufficient documentation

## 2015-04-06 DIAGNOSIS — R52 Pain, unspecified: Secondary | ICD-10-CM

## 2015-04-06 DIAGNOSIS — M25552 Pain in left hip: Secondary | ICD-10-CM | POA: Diagnosis not present

## 2015-04-06 MED ORDER — HYDROMORPHONE HCL 1 MG/ML IJ SOLN
1.0000 mg | Freq: Once | INTRAMUSCULAR | Status: AC
  Administered 2015-04-06: 1 mg via INTRAMUSCULAR

## 2015-04-06 MED ORDER — HYDROMORPHONE HCL 1 MG/ML IJ SOLN
INTRAMUSCULAR | Status: DC
Start: 2015-04-06 — End: 2015-04-06
  Filled 2015-04-06: qty 1

## 2015-04-06 NOTE — ED Notes (Signed)
Pt [facility] on stretcher in exam room with no distress noted; pt c/o left hip pain radiating down to knee since yesterday; st hx bursitis; denies any known injuries; pt reports pt at pain clinic for back pain

## 2015-04-06 NOTE — ED Provider Notes (Signed)
[facility] Emergency Department Provider Note  Time seen: 7:30 AM  I have reviewed the triage vital signs and the nursing notes.   HISTORY  Chief Complaint Hip Pain    HPI [name] is a 69 y.o. female with a past medical history of COPD, hypertension, asthma, chronic back pain, chronic hip pain he presents the emergency department with worsening left hip pain. According to the patient she has "bursitis "in the left hip which she sees pain management for. She states yesterday she walked much more than normaland thinks she exacerbated her bursitis. Denies any falls. Denies any weakness/numbness. Patient denies any other complaints at this time. Patient states her pain is severe/aching, worse with movement. She normally uses a walker to ambulate.     Past Medical History  Diagnosis Date  . COPD (chronic obstructive pulmonary disease)   . Murmur   . Scoliosis   . Hypertension   . Bronchitis   . Asthma   . Degenerative disc disease, lumbar   . Thyroid disease   . Back pain   . FHx: cholecystectomy   . H/O: hysterectomy   . Allergy     Patient Active Problem List   Diagnosis Date Noted  . Sacroiliac joint disease 03/14/2015  . Facet syndrome, lumbar 03/14/2015  . Tobacco abuse 03/08/2015  . COPD exacerbation 03/06/2015  . HTN (hypertension) 03/06/2015  . Thyroid disease 03/06/2015  . DDD (degenerative disc disease), cervical 03/04/2015  . DDD (degenerative disc disease), lumbar 03/04/2015  . DJD (degenerative joint disease) of knee 03/04/2015    Past Surgical History  Procedure Laterality Date  . Rotator cuff repair Right   . Abdominal hysterectomy    . Cholecystectomy    . Shoulder open rotator cuff repair Right   . Shoulder surgery Left     replacement    Current Outpatient Rx  Name  Route  Sig  Dispense  Refill  . Albuterol (VENTOLIN IN)   Inhalation   Inhale 2 puffs into the lungs as needed.         Marland Kitchen alendronate (FOSAMAX)  70 MG tablet   Oral   Take 70 mg by mouth once a week. Take with a full glass of water on an empty stomach. On Sunday         . amLODipine (NORVASC) 5 MG tablet   Oral   Take 5 mg by mouth daily.         Marland Kitchen azithromycin (ZITHROMAX Z-PAK) 250 MG tablet      Two tablets tomorrow and then one tablet daily until completed. Patient not taking: Reported on 03/15/2015   6 each   0   . cefUROXime (CEFTIN) 250 MG tablet   Oral   Take 1 tablet (250 mg total) by mouth 2 (two) times daily with a meal.   14 tablet   0   . Cholecalciferol (VITAMIN D) 2000 UNITS tablet   Oral   Take 2,000 Units by mouth daily.         . citalopram (CELEXA) 20 MG tablet   Oral   Take 20 mg by mouth daily.         Marland Kitchen gabapentin (NEURONTIN) 300 MG capsule   Oral   Take 300 mg by mouth 4 (four) times daily.          Marland Kitchen guaiFENesin (MUCINEX) 600 MG 12 hr tablet   Oral   Take 2 tablets (1,200 mg total) by mouth 2 (two) times  daily. Patient not taking: Reported on 03/15/2015   20 tablet   0   . levothyroxine (SYNTHROID, LEVOTHROID) 112 MCG tablet   Oral   Take 112 mcg by mouth daily before breakfast.         . methocarbamol (ROBAXIN) 500 MG tablet   Oral   Take 500 mg by mouth 4 (four) times daily.         . metoprolol (LOPRESSOR) 100 MG tablet   Oral   Take 100 mg by mouth 2 (two) times daily.         . Multiple Vitamin (MULTIVITAMIN) tablet   Oral   Take 1 tablet by mouth daily.         . naproxen (NAPROSYN) 375 MG tablet   Oral   Take 375 mg by mouth 2 (two) times daily with a meal.         . omeprazole (PRILOSEC) 20 MG capsule   Oral   Take 20 mg by mouth daily.         Marland Kitchen oxycodone (OXY-IR) 5 MG capsule      LIMIT 1 TAB PO BID - QID IF TOLERATED   120 capsule   0   . potassium chloride SA (K-DUR,KLOR-CON) 20 MEQ tablet   Oral   Take 20 mEq by mouth daily.         . predniSONE (DELTASONE) 50 MG tablet      Take one tablet daily and then stop Patient not  taking: Reported on 03/15/2015   4 tablet   0   . senna-docusate (SENOKOT-S) 8.6-50 MG per tablet   Oral   Take 1 tablet by mouth daily.         Marland Kitchen tiotropium (SPIRIVA HANDIHALER) 18 MCG inhalation capsule   Inhalation   Place 1 capsule (18 mcg total) into inhaler and inhale every morning.   30 capsule   12     Allergies Adhesive and Other  Family History  Problem Relation Age of Onset  . Kidney disease Father   . Heart disease Mother   . Diabetes Mother     Social History History  Substance Use Topics  . Smoking status: Current Every Day Smoker -- 1.00 packs/day    Types: Cigarettes  . Smokeless tobacco: Not on file  . Alcohol Use: No    Review of Systems Constitutional: Negative for fever. Cardiovascular: Negative for chest pain. Respiratory: Negative for shortness of breath. Gastrointestinal: Negative for abdominal pain Musculoskeletal: Chronic back pain. Skin: Negative for rash. Neurological: Negative for headaches, focal weakness or numbness.  10-point ROS otherwise negative.  ____________________________________________   PHYSICAL EXAM:  VITAL SIGNS: ED Triage Vitals  Enc Vitals Group     BP 04/06/15 0413 157/93 mmHg     Pulse Rate 04/06/15 0413 68     Resp 04/06/15 0413 18     Temp 04/06/15 0413 98.2 F (36.8 C)     Temp Source 04/06/15 0413 Oral     SpO2 04/06/15 0413 100 %     Weight --      Height --      Head Cir --      Peak Flow --      Pain Score 04/06/15 0414 10     Pain Loc --      Pain Edu? --      Excl. in [place]? --     Constitutional: Alert and oriented. Well appearing and in no distress. Eyes: Normal exam ENT  Head: Normocephalic and atraumatic.   Mouth/Throat: Mucous membranes are moist. Cardiovascular: Normal rate, regular rhythm. No murmurs, rubs, or gallops. Respiratory: Normal respiratory effort without tachypnea nor retractions. Breath sounds are clear Gastrointestinal: Soft and nontender. No distention.    Musculoskeletal: Patient with moderate left hip tenderness to palpation. No pain with range of motion. Full range of motion. Neurovascularly intact with 2+ DP pulse. Neurologic:  Normal speech and language. No gross focal neurologic deficits  Skin:  Skin is warm, dry and intact.  Psychiatric: Mood and affect are normal. Speech and behavior are normal.   ____________________________________________     RADIOLOGY  Hip x-ray within normal limits.  ____________________________________________   INITIAL IMPRESSION / ASSESSMENT AND PLAN / ED COURSE  Pertinent labs & imaging results that were available during my care of the patient were reviewed by me and considered in my medical decision making (see chart for details).  Patient with left hip pain. Good range of motion with no pain with range of motion. Pain with palpation. Likely related to the patient's left chronic hip pain. Patient takes oxycodone at home. We will dose Dilaudid 1 mg IM, make sure the patient can ambulate, and discharge home with follow-up with orthopedics if the pain is not improved. Patient agreeable plan.  ____________________________________________   FINAL CLINICAL IMPRESSION(S) / ED DIAGNOSES  Left hip pain.   Harvest Dark, MD 04/06/15 [phone]

## 2015-04-06 NOTE — Discharge Instructions (Signed)
Arthralgia Arthralgia is joint pain. A joint is a place where two bones meet. Joint pain can happen for many reasons. The joint can be bruised, stiff, infected, or weak from aging. Pain usually goes away after resting and taking medicine for soreness.  HOME CARE  Rest the joint as told by your doctor.  Keep the sore joint raised (elevated) for the first 24 hours.  Put ice on the joint area.  Put ice in a plastic bag.  Place a towel between your skin and the bag.  Leave the ice on for 15-20 minutes, 03-04 times a day.  Wear your splint, casting, elastic bandage, or sling as told by your doctor.  Only take medicine as told by your doctor. Do not take aspirin.  Use crutches as told by your doctor. Do not put weight on the joint until told to by your doctor. GET HELP RIGHT AWAY IF:   You have bruising, puffiness (swelling), or more pain.  Your fingers or toes turn blue or start to lose feeling (numb).  Your medicine does not lessen the pain.  Your pain becomes severe.  You have a temperature by mouth above 102 F (38.9 C), not controlled by medicine.  You cannot move or use the joint. MAKE SURE YOU:   Understand these instructions.  Will watch your condition.  Will get help right away if you are not doing well or get worse. Document Released: 10/04/2009 Document Revised: 01/08/2012 Document Reviewed: 10/04/2009 [facility] Patient Information 2015 [place], [place]. This information is not intended to replace advice given to you by your health care provider. Make sure you discuss any questions you have with your health care provider.    As we have discussed please follow-up with orthopedics if your pain does not improve. Return to the emergency department for any worsening pain, or purulent unable to walk due to pain, or develop fever.

## 2015-04-06 NOTE — ED Notes (Signed)
Pt presents to ED with increase in left hip pain. Pt states she experiences intermittent left hip pain but yesterday around 1600 her pain increased with no known cause. Denies injury or strain to affected joint. Pt reports she did walk more than normal yesterday at the [place] with her husband. Pt is alert and calm at this time with no distress noted.

## 2015-04-08 ENCOUNTER — Telehealth: Payer: Self-pay | Admitting: Pain Medicine

## 2015-04-08 NOTE — Telephone Encounter (Signed)
Apt sch for 6/13 @ 7:30

## 2015-04-08 NOTE — Telephone Encounter (Signed)
Patient notified of appointment on Monday 04-12-15 at 0700 for procedure.  Pre procedure instructions given with teach back 3 done. Scheduled by Caryl Pina.

## 2015-04-08 NOTE — Telephone Encounter (Signed)
[name], and other nurses,  Please schedule patient for procedure on Monday, 04/12/2015

## 2015-04-08 NOTE — Telephone Encounter (Signed)
Went to ED on Tuesday for left hip pain. Diagnosed with bursitis. Was given a "pain shot". Still having a lot of pain. Wants to come in for injection.

## 2015-04-08 NOTE — Telephone Encounter (Signed)
Pt left vm, having a lot of hip pain

## 2015-04-10 ENCOUNTER — Other Ambulatory Visit: Payer: Self-pay | Admitting: Pain Medicine

## 2015-04-10 DIAGNOSIS — M5030 Other cervical disc degeneration, unspecified cervical region: Secondary | ICD-10-CM

## 2015-04-10 DIAGNOSIS — M7060 Trochanteric bursitis, unspecified hip: Secondary | ICD-10-CM

## 2015-04-10 DIAGNOSIS — M47816 Spondylosis without myelopathy or radiculopathy, lumbar region: Secondary | ICD-10-CM

## 2015-04-10 DIAGNOSIS — M170 Bilateral primary osteoarthritis of knee: Secondary | ICD-10-CM

## 2015-04-10 DIAGNOSIS — M5136 Other intervertebral disc degeneration, lumbar region: Secondary | ICD-10-CM

## 2015-04-10 DIAGNOSIS — M533 Sacrococcygeal disorders, not elsewhere classified: Secondary | ICD-10-CM

## 2015-04-12 ENCOUNTER — Ambulatory Visit: Payer: PPO | Attending: Pain Medicine | Admitting: Pain Medicine

## 2015-04-12 VITALS — BP 128/67 | HR 60 | Temp 98.1°F | Resp 18 | Ht <= 58 in | Wt 187.0 lb

## 2015-04-12 DIAGNOSIS — M533 Sacrococcygeal disorders, not elsewhere classified: Secondary | ICD-10-CM

## 2015-04-12 DIAGNOSIS — M5136 Other intervertebral disc degeneration, lumbar region: Secondary | ICD-10-CM

## 2015-04-12 DIAGNOSIS — M47816 Spondylosis without myelopathy or radiculopathy, lumbar region: Secondary | ICD-10-CM | POA: Insufficient documentation

## 2015-04-12 DIAGNOSIS — M170 Bilateral primary osteoarthritis of knee: Secondary | ICD-10-CM

## 2015-04-12 DIAGNOSIS — M545 Low back pain: Secondary | ICD-10-CM | POA: Diagnosis present

## 2015-04-12 DIAGNOSIS — M5030 Other cervical disc degeneration, unspecified cervical region: Secondary | ICD-10-CM

## 2015-04-12 DIAGNOSIS — M7060 Trochanteric bursitis, unspecified hip: Secondary | ICD-10-CM

## 2015-04-12 MED ORDER — OXYCODONE HCL 5 MG PO CAPS: ORAL_CAPSULE | ORAL | Status: DC

## 2015-04-12 MED ORDER — FENTANYL CITRATE (PF) 100 MCG/2ML IJ SOLN
INTRAMUSCULAR | Status: AC
  Administered 2015-04-12: 75 ug via INTRAVENOUS
  Filled 2015-04-12: qty 2

## 2015-04-12 MED ORDER — TRIAMCINOLONE ACETONIDE 40 MG/ML IJ SUSP
INTRAMUSCULAR | Status: AC
  Administered 2015-04-12: 10 mg
  Filled 2015-04-12: qty 1

## 2015-04-12 MED ORDER — ORPHENADRINE CITRATE 30 MG/ML IJ SOLN
INTRAMUSCULAR | Status: AC
  Administered 2015-04-12: 60 mg
  Filled 2015-04-12: qty 2

## 2015-04-12 MED ORDER — BUPIVACAINE HCL (PF) 0.25 % IJ SOLN
INTRAMUSCULAR | Status: AC
  Administered 2015-04-12: 20 mL
  Filled 2015-04-12: qty 30

## 2015-04-12 MED ORDER — GABAPENTIN 300 MG PO CAPS: ORAL_CAPSULE | ORAL | Status: DC

## 2015-04-12 MED ORDER — MIDAZOLAM HCL 5 MG/5ML IJ SOLN
INTRAMUSCULAR | Status: AC
  Administered 2015-04-12: 2 mg via INTRAVENOUS
  Filled 2015-04-12: qty 5

## 2015-04-12 MED ORDER — METHOCARBAMOL 500 MG PO TABS: ORAL_TABLET | ORAL | Status: DC

## 2015-04-12 NOTE — Patient Instructions (Addendum)
Continue present medications.  F/U PCP for evaliation of  BP and general medical  condition.  F/U surgical evaluation.  F/U neurological evaluation.  May consider radiofrequency rhizolysis or intraspinal procedures pending response to present treatment and F/U evaluation.  Patient to call [facility] should patient have concerns prior to scheduled return appointment.    Pain Management Discharge Instructions  General Discharge Instructions :  If you need to reach your doctor call: Monday-Friday 8:00 am - 4:00 pm at [phone] or toll free [phone].  After clinic hours [phone] to have operator reach doctor.  Bring all of your medication bottles to all your appointments in the pain clinic.  To cancel or reschedule your appointment with Pain Management please remember to call 24 hours in advance to avoid a fee.  Refer to the educational materials which you have been given on: General Risks, I had my Procedure. Discharge Instructions, Post Sedation.  Post Procedure Instructions:  The drugs you were given will stay in your system until tomorrow, so for the next 24 hours you should not drive, make any legal decisions or drink any alcoholic beverages.  You may eat anything you prefer, but it is better to start with liquids then soups and crackers, and gradually work up to solid foods.  Please notify your doctor immediately if you have any unusual bleeding, trouble breathing or pain that is not related to your normal pain.  Depending on the type of procedure that was done, some parts of your body may feel week and/or numb.  This usually clears up by tonight or the next day.  Walk with the use of an assistive device or accompanied by an adult for the 24 hours.  You may use ice on the affected area for the first 24 hours.  Put ice in a Ziploc bag and cover with a towel and place against area 15 minutes on 15 minutes off.  You may switch to heat after 24 hours.  A  prescription for NEURONTIN and ROBAXIN was sent to your pharmacy and should be available for pickup today. A prescription for OXYCODONE was given to you today.

## 2015-04-12 NOTE — Progress Notes (Signed)
Safety precautions to be maintained throughout the outpatient stay will include: orient to surroundings, keep bed in low position, maintain call bell within reach at all times, provide assistance with transfer out of bed and ambulation.  Tolerated po fluids well.  

## 2015-04-12 NOTE — Progress Notes (Signed)
Subjective:    Patient ID: [name], female    DOB: 1945/12/26, 69 y.o.   MRN: [id number]  HPI  PROCEDURE:  Block of nerves to the sacroiliac joint.   NOTE:  The patient is a 69 y.o. female who returns to the [place] for further evaluation and treatment of pain involving the lower back and lower extremity region with pain in the region of the buttocks as well. Prior  MRI studies reveal  Multilevel degenerative changes of the lumbar spine with multilevel spondylosis , facet arthrosis and neural foraminal encroachment. . The patient also is with  Severe tenderness of the PSIS and PII S regions as well as positive Patrick's maneuver suggesting significant sacroiliac joint involvement due to sacroiliac joint disease.  We will proceed with block of nerves to the sacroiliac joint at this time in attempt to decrease severity of symptoms  Last progression of patient's symptoms and hopefully avoid the need for more involved treatmentThe risks, benefits, expectations of the procedure have been discussed and explained to the patient who is understanding and willing to proceed with interventional treatment in attempt to decrease severity of patient's symptoms, minimize the risk of medication escalation , and  hopefully retard the progression of the patient's symptoms. We will proceed with what is felt to be a medically necessary procedure, block of nerves to the sacroiliac joint.   DESCRIPTION OF PROCEDURE:  Block of nerves to the sacroiliac joint.   The patient was taken to the fluoroscopy suite. With the patient in the prone position with EKG, blood pressure, pulse and pulse oximetry monitoring, IV Versed, IV fentanyl conscious sedation, Betadine prep of proposed entry site was performed.   Block of nerves at the L5 vertebral body level.   With the patient in prone position, under fluoroscopic guidance, a 22 -gauge needle was inserted at the L5 vertebral body level on the  left side.  With 15 degrees oblique orientation a 22 -gauge needle was inserted in the region known as Burton's eye or eye of the Scotty dog. Following documentation of needle placement in the area of Burton's eye or eye of the Scotty dog under fluoroscopic guidance, needle placement was then accomplished at the sacral ala level on the  left side.   Needle placement at the sacral ala.   With the patient in prone position under fluoroscopic guidance with AP view of the lumbosacral spine, a 22 -gauge needle was inserted in the region known as the sacral ala on the  left side. Following documentation of needle placement on the  left side under fluoroscopic guidance needle placement was then accomplished at the S1 foramen level.   Needle placement at the S1 foramen level.   With the patient in prone position under fluoroscopic guidance with AP view of the lumbosacral spine and cephalad orientation, a 22 -gauge needle was inserted at the superior and lateral border of the S1 foramen on the  left side. Following documentation of needle placement at the S1 foramen level on the  left side, needle placement was then accomplished at the S2 foramen level on the  left side.   Needle placement at the S2 foramen level.   With the patient in prone position with AP view of the lumbosacral spine with cephalad orientation, a 22 - gauge needle was inserted at the superior and lateral border of the S2 foramen under fluoroscopic guidance on the  left side. Following needle placement at the L5 vertebral body level,  sacral ala, S1 foramen and S2 foramen on the  left side, needle placement was verified on lateral view under fluoroscopic guidance.  Following needle placement documentation on lateral view, each needle was injected with 1 mL of 0.25% bupivacaine and Kenalog.    BLOCK OF  NERVES TO  THE SACROILIAC JOINT ON THE RIGHT SIDE   The procedure was performed on the right side exactly as was performed on the left side at the same  levels and under fluoroscopic  Guidance utilizing the same technique   A total of '10mg'$  of Kenalog was utilized for the procedure.  The patient tolerated procedure well  PLAN:  1. Medications: The patient will continue presently prescribed medications.  2. The patient will be considered for modification of treatment regimen pending response to the procedure performed on today's visit.  3. The patient is to follow-up with primary care physician for evaluation of blood pressure and general medical condition following the procedure performed on today's visit.  4. Surgical evaluation as discussed.  5. Neurological evaluation as discussed.  6. The patient may be a candidate for radiofrequency procedures, implantation devices and other treatment pending response to treatment performed on today's visit and follow-up evaluation.  7. The patient has been advised to adhere to proper body mechanics and to avoid activities which may exacerbate the patient's symptoms.   Return appointment to [facility] Management Center as scheduled.       Review of Systems     Objective:   Physical Exam        Assessment & Plan:

## 2015-04-13 ENCOUNTER — Ambulatory Visit: Payer: PPO | Admitting: Pain Medicine

## 2015-04-13 ENCOUNTER — Telehealth: Payer: Self-pay | Admitting: *Deleted

## 2015-04-13 NOTE — Telephone Encounter (Signed)
Left voice mail

## 2015-05-11 ENCOUNTER — Ambulatory Visit: Payer: PPO | Attending: Pain Medicine | Admitting: Pain Medicine

## 2015-05-11 ENCOUNTER — Encounter: Payer: Self-pay | Admitting: Pain Medicine

## 2015-05-11 VITALS — BP 130/81 | HR 58 | Temp 98.3°F | Resp 18 | Ht <= 58 in | Wt 187.0 lb

## 2015-05-11 DIAGNOSIS — M179 Osteoarthritis of knee, unspecified: Secondary | ICD-10-CM | POA: Diagnosis not present

## 2015-05-11 DIAGNOSIS — M5030 Other cervical disc degeneration, unspecified cervical region: Secondary | ICD-10-CM | POA: Insufficient documentation

## 2015-05-11 DIAGNOSIS — M7060 Trochanteric bursitis, unspecified hip: Secondary | ICD-10-CM

## 2015-05-11 DIAGNOSIS — M546 Pain in thoracic spine: Secondary | ICD-10-CM | POA: Diagnosis present

## 2015-05-11 DIAGNOSIS — M533 Sacrococcygeal disorders, not elsewhere classified: Secondary | ICD-10-CM | POA: Diagnosis not present

## 2015-05-11 DIAGNOSIS — M5136 Other intervertebral disc degeneration, lumbar region: Secondary | ICD-10-CM

## 2015-05-11 DIAGNOSIS — M5126 Other intervertebral disc displacement, lumbar region: Secondary | ICD-10-CM | POA: Diagnosis not present

## 2015-05-11 DIAGNOSIS — M1610 Unilateral primary osteoarthritis, unspecified hip: Secondary | ICD-10-CM | POA: Insufficient documentation

## 2015-05-11 DIAGNOSIS — M542 Cervicalgia: Secondary | ICD-10-CM | POA: Diagnosis present

## 2015-05-11 DIAGNOSIS — M5481 Occipital neuralgia: Secondary | ICD-10-CM | POA: Insufficient documentation

## 2015-05-11 DIAGNOSIS — M47816 Spondylosis without myelopathy or radiculopathy, lumbar region: Secondary | ICD-10-CM

## 2015-05-11 DIAGNOSIS — M170 Bilateral primary osteoarthritis of knee: Secondary | ICD-10-CM

## 2015-05-11 MED ORDER — METHOCARBAMOL 500 MG PO TABS: ORAL_TABLET | ORAL | Status: DC

## 2015-05-11 MED ORDER — OXYCODONE HCL 5 MG PO CAPS: ORAL_CAPSULE | ORAL | Status: DC

## 2015-05-11 MED ORDER — GABAPENTIN 300 MG PO CAPS: ORAL_CAPSULE | ORAL | Status: DC

## 2015-05-11 NOTE — Progress Notes (Signed)
Safety precautions to be maintained throughout the outpatient stay will include: orient to surroundings, keep bed in low position, maintain call bell within reach at all times, provide assistance with transfer out of bed and ambulation.  

## 2015-05-11 NOTE — Patient Instructions (Addendum)
Continue present medications Neurontin  and oxycodone  Lumbar facet, medial branch nerve, blocks to be performed 05/19/2015  F/U PCP for evaliation of  BP and general medical  condition.  F/U surgical evaluation  F/U neurological evaluation  May consider radiofrequency rhizolysis or intraspinal procedures pending response to present treatment and F/U evaluation.  Patient to call Pain Management Center should patient have concerns prior to scheduled return appointment. Pain Management Discharge Instructions  General Discharge Instructions :  If you need to reach your doctor call: Monday-Friday 8:00 am - 4:00 pm at [phone] or toll free [phone].  After clinic hours [phone] to have operator reach doctor.  Bring all of your medication bottles to all your appointments in the pain clinic.  To cancel or reschedule your appointment with Pain Management please remember to call 24 hours in advance to avoid a fee.  Refer to the educational materials which you have been given on: General Risks, I had my Procedure. Discharge Instructions, Post Sedation.  Post Procedure Instructions:  The drugs you were given will stay in your system until tomorrow, so for the next 24 hours you should not drive, make any legal decisions or drink any alcoholic beverages.  You may eat anything you prefer, but it is better to start with liquids then soups and crackers, and gradually work up to solid foods.  Please notify your doctor immediately if you have any unusual bleeding, trouble breathing or pain that is not related to your normal pain.  Depending on the type of procedure that was done, some parts of your body may feel week and/or numb.  This usually clears up by tonight or the next day.  Walk with the use of an assistive device or accompanied by an adult for the 24 hours.  You may use ice on the affected area for the first 24 hours.  Put ice in a Ziploc bag and cover with a towel and place  against area 15 minutes on 15 minutes off.  You may switch to heat after 24 hours.Facet Joint Block The facet joints connect the bones of the spine (vertebrae). They make it possible for you to bend, twist, and make other movements with your spine. They also prevent you from overbending, overtwisting, and making other excessive movements.  A facet joint block is a procedure where a numbing medicine (anesthetic) is injected into a facet joint. Often, a type of anti-inflammatory medicine called a steroid is also injected. A facet joint block may be done for two reasons:   Diagnosis. A facet joint block may be done as a test to see whether neck or back pain is caused by a worn-down or infected facet joint. If the pain gets better after a facet joint block, it means the pain is probably coming from the facet joint. If the pain does not get better, it means the pain is probably not coming from the facet joint.   Therapy. A facet joint block may be done to relieve neck or back pain caused by a facet joint. A facet joint block is only done as a therapy if the pain does not improve with medicine, exercise programs, physical therapy, and other forms of pain management. LET [facility] CARE PROVIDER KNOW ABOUT:   Any allergies you have.   All medicines you are taking, including vitamins, herbs, eyedrops, and over-the-counter medicines and creams.   Previous problems you or members of your family have had with the use of anesthetics.   Any blood  disorders you have had.   Other health problems you have. RISKS AND COMPLICATIONS Generally, having a facet joint block is safe. However, as with any procedure, complications can occur. Possible complications associated with having a facet joint block include:   Bleeding.   Injury to a nerve near the injection site.   Pain at the injection site.   Weakness or numbness in areas controlled by nerves near the injection site.   Infection.    Temporary fluid retention.   Allergic reaction to anesthetics or medicines used during the procedure. BEFORE THE PROCEDURE   Follow your health care provider's instructions if you are taking dietary supplements or medicines. You may need to stop taking them or reduce your dosage.   Do not take any new dietary supplements or medicines without asking your health care provider first.   Follow your health care provider's instructions about eating and drinking before the procedure. You may need to stop eating and drinking several hours before the procedure.   Arrange to have an adult drive you home after the procedure. PROCEDURE  You may need to remove your clothing and dress in an open-back gown so that your health care provider can access your spine.   The procedure will be done while you are lying on an X-ray table. Most of the time you will be asked to lie on your stomach, but you may be asked to lie in a different position if an injection will be made in your neck.   Special machines will be used to monitor your oxygen levels, heart rate, and blood pressure.   If an injection will be made in your neck, an intravenous (IV) tube will be inserted into one of your veins. Fluids and medicine will flow directly into your body through the IV tube.   The area over the facet joint where the injection will be made will be cleaned with an antiseptic soap. The surrounding skin will be covered with sterile drapes.   An anesthetic will be applied to your skin to make the injection area numb. You may feel a temporary stinging or burning sensation.   A video X-ray machine will be used to locate the joint. A contrast dye may be injected into the facet joint area to help with locating the joint.   When the joint is located, an anesthetic medicine will be injected into the joint through the needle.   Your health care provider will ask you whether you feel pain relief. If you do feel  relief, a steroid may be injected to provide pain relief for a longer period of time. If you do not feel relief or feel only partial relief, additional injections of an anesthetic may be made in other facet joints.   The needle will be removed, the skin will be cleansed, and bandages will be applied.  AFTER THE PROCEDURE   You will be observed for 15-30 minutes before being allowed to go home. Do not drive. Have an adult drive you or take a taxi or public transportation instead.   If you feel pain relief, the pain will return in several hours or days when the anesthetic wears off.   You may feel pain relief 2-14 days after the procedure. The amount of time this relief lasts varies from person to person.   It is normal to feel some tenderness over the injected area(s) for 2 days following the procedure.   If you have diabetes, you may have a temporary increase  in blood sugar. Document Released: 03/07/2007 Document Revised: 03/02/2014 Document Reviewed: 08/05/2012 [facility] Patient Information 2015 [place], [place]. This information is not intended to replace advice given to you by your health care provider. Make sure you discuss any questions you have with your health care provider.

## 2015-05-11 NOTE — Progress Notes (Signed)
Discharged to home ambulatory with script in hand for oxycodone.  Pre procedure instructions given with teach back 3 done. 

## 2015-05-11 NOTE — Progress Notes (Signed)
   Subjective:    Patient ID: [name], female    DOB: November 23, 1945, 69 y.o.   MRN: [id number]  HPI  Patient 69 year old female returns to [place] for further evaluation and treatment of pain involving the neck and entire back upper and lower extremity regions. Patient states that her predominant pain involves the lower back region with pain is aggravated by standing walking twisting turning maneuvers. Patient states that she also has significant component of pain occurring in the region of the left greater trochanteric region. The pain is aggravated by standing walking lying on the side patient states that her lower back lower extremity pain has become quite intense as well. Patient states the pain is aggravated by twisting turning maneuvers predominantly. Patient states she has difficulty turning over in bed severity of lower back lower extremity pain. Patient continues medications including Neurontin and oxycodone. We will proceed with lumbar facet, medial branch nerve, blocks at time return appointment in attempt to decrease it. Patient's symptoms and avoid progression of patient's symptoms. The patient was understanding and agree with suggested treatment plans      Review of Systems     Objective:   Physical Exam There was mild tenderness over the splenius capitis of talus region palpation was reproduced mild discomfort. There were no new lesions of the head and neck noted. Palpation over the region of the Romeo clavicular glenohumeral joint regions were with without significant increase of pain. Patient over the thoracic facet thoracic paraspinal musculature region was with mild increased pain with no crepitus of the thoracic region noted. Tinel and Phalen's maneuver were without increase of pain significant degree. Patient appeared to be with bilaterally equal grip strength. Palpation over the thoracic facet thoracic paraspinal musculature region was a tends to palpation of  moderate degree on the right and moderate to moderately severe degree on the left. Palpation over the lumbar paraspinal musculature region to region lumbar facet region was with moderately severe discomfort on the left. Extension and palpation of the lumbar facets reproduce moderately severe discomfort. Straight leg raising limited to 20 without increased pain dorsiflexion noted. Negative clonus negative Homans . Moderate to moderately severe discomfort of the greater trochanteric region on the left. Maneuver was with mild to moderate increased pain. No abdominal tenderness to palpation and no costovertebral tenderness noted.       Assessment & Plan:    Generative disc disease lumbar spine Multilevel degenerative changes lumbar spine, annular disc bulging L4-L5, central disc protrusion L5-S1, L3-4 prominent flattening of the lateral recess and left paracentral disc protrusion  Lumbar facet syndrome  Sacroiliac joint dysfunction  Degenerative joint disease of knee  Degenerative joint disease of hip  Degenerative disc disease cervical spine  Cervical facet syndrome  Bilateral occipital neuralgia   Plan  Continue present medications Neurontin and oxycodone  Lumbar facet, medial branch nerve, blocks to be performed at time return appointment as discussed    F/U PCP Dr. Lisette Grinder III for evaliation of  BP and general medical  condition.  F/U surgical evaluation  F/U neurological evaluation  May consider radiofrequency rhizolysis or intraspinal procedures pending response to present treatment and F/U evaluation.  Patient to call Pain Management Center should patient have concerns prior to scheduled return appointment.

## 2015-05-19 ENCOUNTER — Ambulatory Visit: Payer: PPO | Attending: Pain Medicine | Admitting: Pain Medicine

## 2015-05-19 ENCOUNTER — Encounter: Payer: Self-pay | Admitting: Pain Medicine

## 2015-05-19 VITALS — BP 126/66 | HR 58 | Temp 97.6°F | Resp 14 | Ht <= 58 in | Wt 186.0 lb

## 2015-05-19 DIAGNOSIS — M4806 Spinal stenosis, lumbar region: Secondary | ICD-10-CM | POA: Diagnosis not present

## 2015-05-19 DIAGNOSIS — M7060 Trochanteric bursitis, unspecified hip: Secondary | ICD-10-CM

## 2015-05-19 DIAGNOSIS — M5136 Other intervertebral disc degeneration, lumbar region: Secondary | ICD-10-CM | POA: Diagnosis not present

## 2015-05-19 DIAGNOSIS — M533 Sacrococcygeal disorders, not elsewhere classified: Secondary | ICD-10-CM

## 2015-05-19 DIAGNOSIS — M5030 Other cervical disc degeneration, unspecified cervical region: Secondary | ICD-10-CM

## 2015-05-19 DIAGNOSIS — M4185 Other forms of scoliosis, thoracolumbar region: Secondary | ICD-10-CM | POA: Insufficient documentation

## 2015-05-19 DIAGNOSIS — M47816 Spondylosis without myelopathy or radiculopathy, lumbar region: Secondary | ICD-10-CM | POA: Diagnosis not present

## 2015-05-19 DIAGNOSIS — M170 Bilateral primary osteoarthritis of knee: Secondary | ICD-10-CM

## 2015-05-19 DIAGNOSIS — M545 Low back pain: Secondary | ICD-10-CM | POA: Diagnosis present

## 2015-05-19 DIAGNOSIS — M79605 Pain in left leg: Secondary | ICD-10-CM | POA: Diagnosis present

## 2015-05-19 DIAGNOSIS — M79604 Pain in right leg: Secondary | ICD-10-CM | POA: Diagnosis present

## 2015-05-19 MED ORDER — ORPHENADRINE CITRATE 30 MG/ML IJ SOLN
INTRAMUSCULAR | Status: AC
  Administered 2015-05-19: 10:00:00
  Filled 2015-05-19: qty 2

## 2015-05-19 MED ORDER — TRIAMCINOLONE ACETONIDE 40 MG/ML IJ SUSP
INTRAMUSCULAR | Status: AC
  Administered 2015-05-19: 10:00:00
  Filled 2015-05-19: qty 1

## 2015-05-19 MED ORDER — CEFUROXIME AXETIL 250 MG PO TABS: 250.0000 mg | ORAL_TABLET | Freq: Two times a day (BID) | ORAL | Status: DC

## 2015-05-19 MED ORDER — BUPIVACAINE HCL (PF) 0.25 % IJ SOLN
INTRAMUSCULAR | Status: AC
  Administered 2015-05-19: 10:00:00
  Filled 2015-05-19: qty 30

## 2015-05-19 MED ORDER — MIDAZOLAM HCL 5 MG/5ML IJ SOLN
INTRAMUSCULAR | Status: AC
  Administered 2015-05-19: 3 mg via INTRAVENOUS
  Filled 2015-05-19: qty 5

## 2015-05-19 MED ORDER — FENTANYL CITRATE (PF) 100 MCG/2ML IJ SOLN
INTRAMUSCULAR | Status: AC
  Administered 2015-05-19: 100 ug via INTRAVENOUS
  Filled 2015-05-19: qty 2

## 2015-05-19 NOTE — Progress Notes (Signed)
Subjective:    Patient ID: [name], female    DOB: Jan 08, 1946, 69 y.o.   MRN: [id number]  HPI  PROCEDURE PERFORMED: Lumbar facet (medial branch block)   NOTE: The patient is a 69 y.o. female who returns to [place] for further evaluation and treatment of pain involving the lumbar and lower extremity region. MRI  revealed the patient to be with evidence of multilevel degenerative changes of the lumbar spine with severe endplate degeneration moderate spinal canal stenosis L4-L5, severe left L4 foraminal stenosis, spinal stenosis L3 for bilateral L5 foraminal stenosis severe at L4 foraminal stenosis multifactorial moderate to severe L5 foraminal stenosis degenerative disc disease and facet hypertrophy noted at multiple levels with rotatory scoliosis of the thoracolumbar spine. There is concern regarding significant component of patient's pain being due to facet degenerative changes with facet syndrome A predominant factor contributing to patient's symptoms Pending response to treatment on today's visit patient may be candidate for radiofrequency rhizolysis lumbar facet, medial branch nerve radiofrequency rhizolysis.. The risks, benefits, and expectations of the procedure have been discussed and explained to the patient who was understanding and in agreement with suggested treatment plan. We will proceed with interventional treatment as discussed and as explained to the patient who was understanding and wished to proceed with procedure as planned.   DESCRIPTION OF PROCEDURE: Lumbar facet (medial branch block) with IV Versed, IV fentanyl conscious sedation, EKG, blood pressure, pulse, and pulse oximetry monitoring. The procedure was performed with the patient in the prone position. Betadine prep of proposed entry site performed.   NEEDLE PLACEMENT AT: Left L 3 lumbar facet (medial branch block). Under fluoroscopic guidance with oblique orientation of 15 degrees, a 22-gauge needle was  inserted at the L 3 vertebral body level with needle placed at the targeted area of Burton's Eye or Eye of the Scotty Dog with documentation of needle placement in the superior and lateral border of targeted area of Burton's Eye or Eye of the Scotty Dog with oblique orientation of 15 degrees. Following documentation of needle placement at the L 3 vertebral body level, needle placement was then accomplished at the L 4 vertebral body level.   NEEDLE PLACEMENT AT L4 and L5 VERTEBRAL BODY LEVELS ON THE LEFT SIDE The procedure was performed at the L4 and L5 vertebral body levels exactly as was performed at the L 3 vertebral body level utilizing the same technique and under fluoroscopic guidance.  NEEDLE PLACEMENT AT THE SACRAL ALA with AP view of the lumbosacral spine. With the patient in the prone position, Betadine prep of proposed entry site accomplished, a 22 gauge needle was inserted in the region of the sacral ala (groove formed by the superior articulating process of S1 and the sacral wing). Following documentation of needle placement at the sacral ala,  needle placement was then accomplished at the S1 foramen level.   NEEDLE PLACEMENT AT THE S1 FORAMEN LEVEL under fluoroscopic guidance with AP view of the lumbosacral spine and cephalad orientation of the fluoroscope, a 22-gauge needle was placed at the superior and lateral border of the S1 foramen under fluoroscopic guidance. Following documentation of needle placement at the S1 foramen.   Needle placement was then verified at all levels on lateral view. Following documentation of needle placement at all levels on lateral view and following negative aspiration for heme and CSF, each level was injected with 1 mL of 0.25% bupivacaine with Kenalog.     LUMBAR FACET, MEDIAL BRANCH NERVE, BLOCKS  PERFORMED ON THE RIGHT SIDE   The procedure was performed on the right side exactly as was performed on the left side at the same levels and utilizing the  same technique under fluoroscopic guidance.     The patient tolerated the procedure well. A total of 40 mg of Kenalog was utilized for the procedure.   PLAN:  1. Medications: The patient will continue presently prescribed medication oxycodone. 2. May consider modification of treatment regimen at time of return appointment pending response to treatment rendered on today's visit. 3. The patient is to follow-up with primary care physician Dr. Lisette Grinder III  for further evaluation of blood pressure and general medical condition status post steroid injection performed on today's visit. 4. Surgical follow-up evaluation. 5. Neurological follow-up evaluation. 6. The patient may be candidate for radiofrequency procedures, implantation type procedures, and other treatment pending response to treatment and follow-up evaluation. 7. The patient has been advised to call the Pain Management Center prior to scheduled return appointment should there be significant change in condition or should patient have other concerns regarding condition prior to scheduled return appointment.  The patient is understanding and in agreement with suggested treatment plan.      Review of Systems     Objective:   Physical Exam        Assessment & Plan:

## 2015-05-19 NOTE — Patient Instructions (Addendum)
Continue present medications and antibiotic. Please obtain your antibiotic  Ceftin and begin taking antibiotic today  F/U PCP Dr. Lisette Grinder III  for evaliation of  BP and general medical  condition.  F/U surgical evaluation as discussed  F/U neurological evaluation.  May consider radiofrequency rhizolysis or intraspinal procedures pending response to present treatment and F/U evaluation.  Patient to call Pain Management Center should patient have concerns prior to scheduled return appointment.  Pain Management Discharge Instructions  General Discharge Instructions :  If you need to reach your doctor call: Monday-Friday 8:00 am - 4:00 pm at [phone] or toll free [phone].  After clinic hours [phone] to have operator reach doctor.  Bring all of your medication bottles to all your appointments in the pain clinic.  To cancel or reschedule your appointment with Pain Management please remember to call 24 hours in advance to avoid a fee.  Refer to the educational materials which you have been given on: General Risks, I had my Procedure. Discharge Instructions, Post Sedation.  Post Procedure Instructions:  The drugs you were given will stay in your system until tomorrow, so for the next 24 hours you should not drive, make any legal decisions or drink any alcoholic beverages.  You may eat anything you prefer, but it is better to start with liquids then soups and crackers, and gradually work up to solid foods.  Please notify your doctor immediately if you have any unusual bleeding, trouble breathing or pain that is not related to your normal pain.  Depending on the type of procedure that was done, some parts of your body may feel week and/or numb.  This usually clears up by tonight or the next day.  Walk with the use of an assistive device or accompanied by an adult for the 24 hours.  You may use ice on the affected area for the first 24 hours.  Put ice in a Ziploc bag and  cover with a towel and place against area 15 minutes on 15 minutes off.  You may switch to heat after 24 hours.Facet Joint Block The facet joints connect the bones of the spine (vertebrae). They make it possible for you to bend, twist, and make other movements with your spine. They also prevent you from overbending, overtwisting, and making other excessive movements.  A facet joint block is a procedure where a numbing medicine (anesthetic) is injected into a facet joint. Often, a type of anti-inflammatory medicine called a steroid is also injected. A facet joint block may be done for two reasons:   Diagnosis. A facet joint block may be done as a test to see whether neck or back pain is caused by a worn-down or infected facet joint. If the pain gets better after a facet joint block, it means the pain is probably coming from the facet joint. If the pain does not get better, it means the pain is probably not coming from the facet joint.   Therapy. A facet joint block may be done to relieve neck or back pain caused by a facet joint. A facet joint block is only done as a therapy if the pain does not improve with medicine, exercise programs, physical therapy, and other forms of pain management. LET [facility] CARE PROVIDER KNOW ABOUT:   Any allergies you have.   All medicines you are taking, including vitamins, herbs, eyedrops, and over-the-counter medicines and creams.   Previous problems you or members of your family have had with the use  of anesthetics.   Any blood disorders you have had.   Other health problems you have. RISKS AND COMPLICATIONS Generally, having a facet joint block is safe. However, as with any procedure, complications can occur. Possible complications associated with having a facet joint block include:   Bleeding.   Injury to a nerve near the injection site.   Pain at the injection site.   Weakness or numbness in areas controlled by nerves near the injection site.    Infection.   Temporary fluid retention.   Allergic reaction to anesthetics or medicines used during the procedure. BEFORE THE PROCEDURE   Follow your health care provider's instructions if you are taking dietary supplements or medicines. You may need to stop taking them or reduce your dosage.   Do not take any new dietary supplements or medicines without asking your health care provider first.   Follow your health care provider's instructions about eating and drinking before the procedure. You may need to stop eating and drinking several hours before the procedure.   Arrange to have an adult drive you home after the procedure. PROCEDURE  You may need to remove your clothing and dress in an open-back gown so that your health care provider can access your spine.   The procedure will be done while you are lying on an X-ray table. Most of the time you will be asked to lie on your stomach, but you may be asked to lie in a different position if an injection will be made in your neck.   Special machines will be used to monitor your oxygen levels, heart rate, and blood pressure.   If an injection will be made in your neck, an intravenous (IV) tube will be inserted into one of your veins. Fluids and medicine will flow directly into your body through the IV tube.   The area over the facet joint where the injection will be made will be cleaned with an antiseptic soap. The surrounding skin will be covered with sterile drapes.   An anesthetic will be applied to your skin to make the injection area numb. You may feel a temporary stinging or burning sensation.   A video X-ray machine will be used to locate the joint. A contrast dye may be injected into the facet joint area to help with locating the joint.   When the joint is located, an anesthetic medicine will be injected into the joint through the needle.   Your health care provider will ask you whether you feel pain relief. If  you do feel relief, a steroid may be injected to provide pain relief for a longer period of time. If you do not feel relief or feel only partial relief, additional injections of an anesthetic may be made in other facet joints.   The needle will be removed, the skin will be cleansed, and bandages will be applied.  AFTER THE PROCEDURE   You will be observed for 15-30 minutes before being allowed to go home. Do not drive. Have an adult drive you or take a taxi or public transportation instead.   If you feel pain relief, the pain will return in several hours or days when the anesthetic wears off.   You may feel pain relief 2-14 days after the procedure. The amount of time this relief lasts varies from person to person.   It is normal to feel some tenderness over the injected area(s) for 2 days following the procedure.   If you have diabetes,  you may have a temporary increase in blood sugar. Document Released: 03/07/2007 Document Revised: 03/02/2014 Document Reviewed: 08/05/2012 [facility] Patient Information 2015 [place], [place]. This information is not intended to replace advice given to you by your health care provider. Make sure you discuss any questions you have with your health care provider.

## 2015-05-20 ENCOUNTER — Telehealth: Payer: Self-pay | Admitting: *Deleted

## 2015-05-20 NOTE — Telephone Encounter (Signed)
No problems 

## 2015-05-25 DIAGNOSIS — M545 Low back pain, unspecified: Secondary | ICD-10-CM | POA: Insufficient documentation

## 2015-06-08 ENCOUNTER — Encounter: Payer: Self-pay | Admitting: Pain Medicine

## 2015-06-08 ENCOUNTER — Ambulatory Visit: Payer: PPO | Attending: Pain Medicine | Admitting: Pain Medicine

## 2015-06-08 VITALS — BP 114/83 | HR 59 | Temp 96.5°F | Resp 20 | Ht <= 58 in | Wt 184.0 lb

## 2015-06-08 DIAGNOSIS — M79605 Pain in left leg: Secondary | ICD-10-CM | POA: Diagnosis present

## 2015-06-08 DIAGNOSIS — M5136 Other intervertebral disc degeneration, lumbar region: Secondary | ICD-10-CM

## 2015-06-08 DIAGNOSIS — M7060 Trochanteric bursitis, unspecified hip: Secondary | ICD-10-CM

## 2015-06-08 DIAGNOSIS — M179 Osteoarthritis of knee, unspecified: Secondary | ICD-10-CM | POA: Diagnosis not present

## 2015-06-08 DIAGNOSIS — M545 Low back pain: Secondary | ICD-10-CM | POA: Diagnosis present

## 2015-06-08 DIAGNOSIS — M47816 Spondylosis without myelopathy or radiculopathy, lumbar region: Secondary | ICD-10-CM | POA: Insufficient documentation

## 2015-06-08 DIAGNOSIS — M533 Sacrococcygeal disorders, not elsewhere classified: Secondary | ICD-10-CM | POA: Insufficient documentation

## 2015-06-08 DIAGNOSIS — M1610 Unilateral primary osteoarthritis, unspecified hip: Secondary | ICD-10-CM | POA: Insufficient documentation

## 2015-06-08 DIAGNOSIS — M79604 Pain in right leg: Secondary | ICD-10-CM | POA: Diagnosis present

## 2015-06-08 DIAGNOSIS — M5126 Other intervertebral disc displacement, lumbar region: Secondary | ICD-10-CM | POA: Diagnosis not present

## 2015-06-08 DIAGNOSIS — M5030 Other cervical disc degeneration, unspecified cervical region: Secondary | ICD-10-CM | POA: Diagnosis not present

## 2015-06-08 DIAGNOSIS — M170 Bilateral primary osteoarthritis of knee: Secondary | ICD-10-CM

## 2015-06-08 MED ORDER — OXYCODONE HCL 5 MG PO CAPS: ORAL_CAPSULE | ORAL | Status: DC

## 2015-06-08 MED ORDER — GABAPENTIN 300 MG PO CAPS: ORAL_CAPSULE | ORAL | Status: DC

## 2015-06-08 NOTE — Progress Notes (Signed)
Subjective:    Patient ID: [name], female    DOB: 1946/01/23, 69 y.o.   MRN: [id number]  HPI  Patient is 69 year old female returns to [place] for further evaluation and treatment of pain involving the region of the lower back and lower extremity regions predominantly with pain of the neck and upper extremity regions of lesser degree patient is with severe degenerative disc disease of the lumbar spine with significant scoliosis as well as with pain involving the region of the buttocks aggravated by standing walking and attempting to climb stairs especially we discussed patient's condition and patient appeared to be with component of sacroiliac joint dysfunction contributing to patient's symptomatology in addition to intraspinal abnormalities. We will proceed with block of nerves to the sacroiliac joint at time return appointment in attempt to decrease severity of patient's symptoms, minimize progression of patient's symptoms, and avoid the need for more involved treatment. The patient was with understanding and in agreement with suggested treatment plan. We will continue patient's Neurontin and oxycodone as prescribed    Review of Systems     Objective:   Physical Exam  There was tenderness of the splenius capitis and occipitalis musculature regions of moderate degree there was tenderness over the acromioclavicular and glenohumeral joint regions of mild to moderate degree. Patient appeared to be with slightly decreased grip strength and Tinel and Phalen's maneuver were without increase of pain of significant degree. There was tenderness over the upper mid and lower thoracic regions with the lower thoracic region with moderate to moderately severe tenderness to palpation on the left as well as on the right. No crepitus of the thoracic region was noted. Patient appeared to be with without significant increase of pain with Tinel and Phalen's maneuver. There was unremarkable  Spurling's maneuver. Palpation over the lumbar paraspinal musculature region lumbar facet region was a tends to palpation of moderate to moderately severe degree. Lateral bending and rotation and extension and palpation of the lumbar facets reproduce moderate moderately severe discomfort. There was severe tenderness over the PSIS and PII S regions. Palpation of the gluteal and piriformis musculature regions reproduced moderate to moderately severe discomfort. There was questionable decreased sensation along the L5 dermatomal distribution. Straight leg raising was limited to approximately 20 without an increase of pain with dorsiflexion noted. There was mild to moderate tenderness of the greater trochanteric region and iliotibial band region. There was negative clonus negative Homans. Abdomen was nontender and there was no costovertebral angle tenderness noted        Assessment & Plan:   Denerative disc disease lumbar spine Multilevel degenerative changes lumbar spine, annular disc bulging L4-L5, central disc protrusion L5-S1, L3-4 prominent flattening of the lateral recess and left paracentral disc protrusion  Lumbar facet syndrome  Sacroiliac joint dysfunction  Degenerative joint disease of knee  Degenerative joint disease of hip  Degenerative disc disease cervical spine  Cervical facet syndrome   Plan   Continue present medication Neurontin and oxycodone  Block of nerves to the sacroiliac joint to be performed at time of return appointment  F/U PCP Dr. Lisette Grinder III for evaliation of  BP and general medical  condition  F/U surgical evaluation. We have discussed surgical evaluation. Patient is without plans for surgical intervention  F/U neurological evaluation  May consider radiofrequency rhizolysis or intraspinal procedures pending response to present treatment and F/U evaluation . We will attempt to have patient approval for radiofrequency rhizolysis of the lumbar facets,  medial  branch nerves,  Patient to call [facility] should patient have concerns prior to scheduled return appointmen.

## 2015-06-08 NOTE — Progress Notes (Signed)
Safety precautions to be maintained throughout the outpatient stay will include: orient to surroundings, keep bed in low position, maintain call bell within reach at all times, provide assistance with transfer out of bed and ambulation.  

## 2015-06-08 NOTE — Patient Instructions (Addendum)
Continue present medication oxycodone and gabapentin (Neurontin)  Block of nerves to the sacroiliac joint to be performed at the time of your return appointment  F/U PCP  Dr. Lisette Grinder  III for evaliation of  BP and general medical  condition  F/U surgical evaluation  F/U neurological evaluation  May consider radiofrequency rhizolysis or intraspinal procedures pending response to present treatment and F/U evaluation   Patient to call Pain Management Center should patient have concerns prior to scheduled return appointmen. Sacroiliac (SI) Joint Injection Patient Information  Description: The sacroiliac joint connects the scrum (very low back and tailbone) to the ilium (a pelvic bone which also forms half of the hip joint).  Normally this joint experiences very little motion.  When this joint becomes inflamed or unstable low back and or hip and pelvis pain may result.  Injection of this joint with local anesthetics (numbing medicines) and steroids can provide diagnostic information and reduce pain.  This injection is performed with the aid of x-ray guidance into the tailbone area while you are lying on your stomach.   You may experience an electrical sensation down the leg while this is being done.  You may also experience numbness.  We also may ask if we are reproducing your normal pain during the injection.  Conditions which may be treated SI injection:   Low back, buttock, hip or leg pain  Preparation for the Injection:  1. Do not eat any solid food or dairy products within 6 hours of your appointment.  2. You may drink clear liquids up to 2 hours before appointment.  Clear liquids include water, black coffee, juice or soda.  No milk or cream please. 3. You may take your regular medications, including pain medications with a sip of water before your appointment.  Diabetics should hold regular insulin (if take separately) and take 1/2 normal NPH dose the morning of the procedure.  Carry  some sugar containing items with you to your appointment. 4. A driver must accompany you and be prepared to drive you home after your procedure. 5. Bring all of your current medications with you. 6. An IV may be inserted and sedation may be given at the discretion of the physician. 7. A blood pressure cuff, EKG and other monitors will often be applied during the procedure.  Some patients may need to have extra oxygen administered for a short period.  8. You will be asked to provide medical information, including your allergies, prior to the procedure.  We must know immediately if you are taking blood thinners (like Coumadin/Warfarin) or if you are allergic to IV iodine contrast (dye).  We must know if you could possible be pregnant.  Possible side effects:   Bleeding from needle site  Infection (rare, may require surgery)  Nerve injury (rare)  Numbness & tingling (temporary)  A brief convulsion or seizure  Light-headedness (temporary)  Pain at injection site (several days)  Decreased blood pressure (temporary)  Weakness in the leg (temporary)   Call if you experience:   New onset weakness or numbness of an extremity below the injection site that last more than 8 hours.  Hives or difficulty breathing ( go to the emergency room)  Inflammation or drainage at the injection site  Any new symptoms which are concerning to you  Please note:  Although the local anesthetic injected can often make your back/ hip/ buttock/ leg feel good for several hours after the injections, the pain will likely return.  It  takes 3-7 days for steroids to work in the sacroiliac area.  You may not notice any pain relief for at least that one week.  If effective, we will often do a series of three injections spaced 3-6 weeks apart to maximally decrease your pain.  After the initial series, we generally will wait some months before a repeat injection of the same type.  If you have any questions, please  call [phone] [facility]

## 2015-06-14 ENCOUNTER — Other Ambulatory Visit: Payer: Self-pay | Admitting: Pain Medicine

## 2015-06-28 ENCOUNTER — Ambulatory Visit: Payer: PPO | Attending: Pain Medicine | Admitting: Pain Medicine

## 2015-06-28 ENCOUNTER — Encounter: Payer: Self-pay | Admitting: Pain Medicine

## 2015-06-28 VITALS — BP 148/91 | HR 60 | Temp 98.8°F | Resp 18 | Ht <= 58 in | Wt 185.0 lb

## 2015-06-28 DIAGNOSIS — M79604 Pain in right leg: Secondary | ICD-10-CM | POA: Diagnosis present

## 2015-06-28 DIAGNOSIS — M7060 Trochanteric bursitis, unspecified hip: Secondary | ICD-10-CM

## 2015-06-28 DIAGNOSIS — M170 Bilateral primary osteoarthritis of knee: Secondary | ICD-10-CM

## 2015-06-28 DIAGNOSIS — M5030 Other cervical disc degeneration, unspecified cervical region: Secondary | ICD-10-CM

## 2015-06-28 DIAGNOSIS — M5136 Other intervertebral disc degeneration, lumbar region: Secondary | ICD-10-CM

## 2015-06-28 DIAGNOSIS — M47816 Spondylosis without myelopathy or radiculopathy, lumbar region: Secondary | ICD-10-CM

## 2015-06-28 DIAGNOSIS — M545 Low back pain: Secondary | ICD-10-CM | POA: Diagnosis present

## 2015-06-28 DIAGNOSIS — M5126 Other intervertebral disc displacement, lumbar region: Secondary | ICD-10-CM | POA: Insufficient documentation

## 2015-06-28 DIAGNOSIS — J441 Chronic obstructive pulmonary disease with (acute) exacerbation: Secondary | ICD-10-CM

## 2015-06-28 DIAGNOSIS — M79605 Pain in left leg: Secondary | ICD-10-CM | POA: Diagnosis present

## 2015-06-28 DIAGNOSIS — M533 Sacrococcygeal disorders, not elsewhere classified: Secondary | ICD-10-CM

## 2015-06-28 MED ORDER — MIDAZOLAM HCL 5 MG/5ML IJ SOLN
INTRAMUSCULAR | Status: AC
  Administered 2015-06-28: 3 mg via INTRAVENOUS
  Filled 2015-06-28: qty 5

## 2015-06-28 MED ORDER — CEFUROXIME AXETIL 250 MG PO TABS: 250.0000 mg | ORAL_TABLET | Freq: Two times a day (BID) | ORAL | Status: DC

## 2015-06-28 MED ORDER — ORPHENADRINE CITRATE 30 MG/ML IJ SOLN
INTRAMUSCULAR | Status: AC
Start: 2015-06-28 — End: 2015-06-28
  Administered 2015-06-28: 09:00:00
  Filled 2015-06-28: qty 2

## 2015-06-28 MED ORDER — FENTANYL CITRATE (PF) 100 MCG/2ML IJ SOLN
INTRAMUSCULAR | Status: AC
  Administered 2015-06-28: 50 ug via INTRAVENOUS
  Filled 2015-06-28: qty 2

## 2015-06-28 MED ORDER — TRIAMCINOLONE ACETONIDE 40 MG/ML IJ SUSP
INTRAMUSCULAR | Status: AC
  Administered 2015-06-28: 09:00:00
  Filled 2015-06-28: qty 1

## 2015-06-28 MED ORDER — BUPIVACAINE HCL (PF) 0.25 % IJ SOLN
INTRAMUSCULAR | Status: AC
  Administered 2015-06-28: 09:00:00
  Filled 2015-06-28: qty 30

## 2015-06-28 NOTE — Progress Notes (Signed)
Safety precautions to be maintained throughout the outpatient stay will include: orient to surroundings, keep bed in low position, maintain call bell within reach at all times, provide assistance with transfer out of bed and ambulation.  

## 2015-06-28 NOTE — Patient Instructions (Addendum)
Continue present medications Neurontin and oxycodone and begin taking antibiotic Ceftin as prescribed. Please obtain your antibiotic Ceftin today and begin taking antibiotic today  F/U PCP Dr. Lisette Grinder III for evaliation of  BP and general medical  condition.  F/U surgical evaluation as discussed  F/U neurological evaluation.  May consider radiofrequency rhizolysis or intraspinal procedures pending response to present treatment and F/U evaluation.  Patient to call Pain Management Center should patient have concerns prior to scheduled return appointment.  Pain Management Discharge Instructions  General Discharge Instructions :  If you need to reach your doctor call: Monday-Friday 8:00 am - 4:00 pm at [phone] or toll free [phone].  After clinic hours [phone] to have operator reach doctor.  Bring all of your medication bottles to all your appointments in the pain clinic.  To cancel or reschedule your appointment with Pain Management please remember to call 24 hours in advance to avoid a fee.  Refer to the educational materials which you have been given on: General Risks, I had my Procedure. Discharge Instructions, Post Sedation.  Post Procedure Instructions:  The drugs you were given will stay in your system until tomorrow, so for the next 24 hours you should not drive, make any legal decisions or drink any alcoholic beverages.  You may eat anything you prefer, but it is better to start with liquids then soups and crackers, and gradually work up to solid foods.  Please notify your doctor immediately if you have any unusual bleeding, trouble breathing or pain that is not related to your normal pain.  Depending on the type of procedure that was done, some parts of your body may feel week and/or numb.  This usually clears up by tonight or the next day.  Walk with the use of an assistive device or accompanied by an adult for the 24 hours.  You may use ice on the  affected area for the first 24 hours.  Put ice in a Ziploc bag and cover with a towel and place against area 15 minutes on 15 minutes off.  You may switch to heat after 24 hours.GENERAL RISKS AND COMPLICATIONS  What are the risk, side effects and possible complications? Generally speaking, most procedures are safe.  However, with any procedure there are risks, side effects, and the possibility of complications.  The risks and complications are dependent upon the sites that are lesioned, or the type of nerve block to be performed.  The closer the procedure is to the spine, the more serious the risks are.  Great care is taken when placing the radio frequency needles, block needles or lesioning probes, but sometimes complications can occur. 1. Infection: Any time there is an injection through the skin, there is a risk of infection.  This is why sterile conditions are used for these blocks.  There are four possible types of infection. 1. Localized skin infection. 2. Central Nervous System Infection-This can be in the form of Meningitis, which can be deadly. 3. Epidural Infections-This can be in the form of an epidural abscess, which can cause pressure inside of the spine, causing compression of the spinal cord with subsequent paralysis. This would require an emergency surgery to decompress, and there are no guarantees that the patient would recover from the paralysis. 4. Discitis-This is an infection of the intervertebral discs.  It occurs in about 1% of discography procedures.  It is difficult to treat and it may lead to surgery.        2.  Pain: the needles have to go through skin and soft tissues, will cause soreness.       3. Damage to internal structures:  The nerves to be lesioned may be near blood vessels or    other nerves which can be potentially damaged.       4. Bleeding: Bleeding is more common if the patient is taking blood thinners such as  aspirin, Coumadin, Ticiid, Plavix, etc., or if he/she  have some genetic predisposition  such as hemophilia. Bleeding into the spinal canal can cause compression of the spinal  cord with subsequent paralysis.  This would require an emergency surgery to  decompress and there are no guarantees that the patient would recover from the  paralysis.       5. Pneumothorax:  Puncturing of a lung is a possibility, every time a needle is introduced in  the area of the chest or upper back.  Pneumothorax refers to free air around the  collapsed lung(s), inside of the thoracic cavity (chest cavity).  Another two possible  complications related to a similar event would include: Hemothorax and Chylothorax.   These are variations of the Pneumothorax, where instead of air around the collapsed  lung(s), you may have blood or chyle, respectively.       6. Spinal headaches: They may occur with any procedures in the area of the spine.       7. Persistent CSF (Cerebro-Spinal Fluid) leakage: This is a rare problem, but may occur  with prolonged intrathecal or epidural catheters either due to the formation of a fistulous  track or a dural tear.       8. Nerve damage: By working so close to the spinal cord, there is always a possibility of  nerve damage, which could be as serious as a permanent spinal cord injury with  paralysis.       9. Death:  Although rare, severe deadly allergic reactions known as "Anaphylactic  reaction" can occur to any of the medications used.      10. Worsening of the symptoms:  We can always make thing worse.  What are the chances of something like this happening? Chances of any of this occuring are extremely low.  By statistics, you have more of a chance of getting killed in a motor vehicle accident: while driving to the hospital than any of the above occurring .  Nevertheless, you should be aware that they are possibilities.  In general, it is similar to taking a shower.  Everybody knows that you can slip, hit your head and get killed.  Does that mean that  you should not shower again?  Nevertheless always keep in mind that statistics do not mean anything if you happen to be on the wrong side of them.  Even if a procedure has a 1 (one) in a 1,000,000 (million) chance of going wrong, it you happen to be that one..Also, keep in mind that by statistics, you have more of a chance of having something go wrong when taking medications.  Who should not have this procedure? If you are on a blood thinning medication (e.g. Coumadin, Plavix, see list of "Blood Thinners"), or if you have an active infection going on, you should not have the procedure.  If you are taking any blood thinners, please inform your physician.  How should I prepare for this procedure?  Do not eat or drink anything at least six hours prior to the procedure.  Bring a driver with  you .  It cannot be a taxi.  Come accompanied by an adult that can drive you back, and that is strong enough to help you if your legs get weak or numb from the local anesthetic.  Take all of your medicines the morning of the procedure with just enough water to swallow them.  If you have diabetes, make sure that you are scheduled to have your procedure done first thing in the morning, whenever possible.  If you have diabetes, take only half of your insulin dose and notify our nurse that you have done so as soon as you arrive at the clinic.  If you are diabetic, but only take blood sugar pills (oral hypoglycemic), then do not take them on the morning of your procedure.  You may take them after you have had the procedure.  Do not take aspirin or any aspirin-containing medications, at least eleven (11) days prior to the procedure.  They may prolong bleeding.  Wear loose fitting clothing that may be easy to take off and that you would not mind if it got stained with Betadine or blood.  Do not wear any jewelry or perfume  Remove any nail coloring.  It will interfere with some of our monitoring equipment.  NOTE:  Remember that this is not meant to be interpreted as a complete list of all possible complications.  Unforeseen problems may occur.  BLOOD THINNERS The following drugs contain aspirin or other products, which can cause increased bleeding during surgery and should not be taken for 2 weeks prior to and 1 week after surgery.  If you should need take something for relief of minor pain, you may take acetaminophen which is found in Tylenol,m Datril, Anacin-3 and Panadol. It is not blood thinner. The products listed below are.  Do not take any of the products listed below in addition to any listed on your instruction sheet.  A.P.C or A.P.C with Codeine Codeine Phosphate Capsules #3 Ibuprofen Ridaura  ABC compound Congesprin Imuran rimadil  Advil Cope Indocin Robaxisal  Alka-Seltzer Effervescent Pain Reliever and Antacid Coricidin or Coricidin-D  Indomethacin Rufen  Alka-Seltzer plus Cold Medicine Cosprin Ketoprofen S-A-C Tablets  Anacin Analgesic Tablets or Capsules Coumadin Korlgesic Salflex  Anacin Extra Strength Analgesic tablets or capsules CP-2 Tablets Lanoril Salicylate  Anaprox Cuprimine Capsules Levenox Salocol  Anexsia-D Dalteparin Magan Salsalate  Anodynos Darvon compound Magnesium Salicylate Sine-off  Ansaid Dasin Capsules Magsal Sodium Salicylate  Anturane Depen Capsules Marnal Soma  APF Arthritis pain formula Dewitt's Pills Measurin Stanback  Argesic Dia-Gesic Meclofenamic Sulfinpyrazone  Arthritis Bayer Timed Release Aspirin Diclofenac Meclomen Sulindac  Arthritis pain formula Anacin Dicumarol Medipren Supac  Analgesic (Safety coated) Arthralgen Diffunasal Mefanamic Suprofen  Arthritis Strength Bufferin Dihydrocodeine Mepro Compound Suprol  Arthropan liquid Dopirydamole Methcarbomol with Aspirin Synalgos  ASA tablets/Enseals Disalcid Micrainin Tagament  Ascriptin Doan's Midol Talwin  Ascriptin A/D Dolene Mobidin Tanderil  Ascriptin Extra Strength Dolobid Moblgesic Ticlid   Ascriptin with Codeine Doloprin or Doloprin with Codeine Momentum Tolectin  Asperbuf Duoprin Mono-gesic Trendar  Aspergum Duradyne Motrin or Motrin IB Triminicin  Aspirin plain, buffered or enteric coated Durasal Myochrisine Trigesic  Aspirin Suppositories Easprin Nalfon Trillsate  Aspirin with Codeine Ecotrin Regular or Extra Strength Naprosyn Uracel  Atromid-S Efficin Naproxen Ursinus  Auranofin Capsules Elmiron Neocylate Vanquish  Axotal Emagrin Norgesic Verin  Azathioprine Empirin or Empirin with Codeine Normiflo Vitamin E  Azolid Emprazil Nuprin Voltaren  Bayer Aspirin plain, buffered or children's or timed BC Tablets or powders Encaprin  Orgaran Warfarin Sodium  Buff-a-Comp Enoxaparin Orudis Zorpin  Buff-a-Comp with Codeine Equegesic Os-Cal-Gesic   Buffaprin Excedrin plain, buffered or Extra Strength Oxalid   Bufferin Arthritis Strength Feldene Oxphenbutazone   Bufferin plain or Extra Strength Feldene Capsules Oxycodone with Aspirin   Bufferin with Codeine Fenoprofen Fenoprofen Pabalate or Pabalate-SF   Buffets II Flogesic Panagesic   Buffinol plain or Extra Strength Florinal or Florinal with Codeine Panwarfarin   Buf-Tabs Flurbiprofen Penicillamine   Butalbital Compound Four-way cold tablets Penicillin   Butazolidin Fragmin Pepto-Bismol   Carbenicillin Geminisyn Percodan   Carna Arthritis Reliever Geopen Persantine   Carprofen Gold's salt Persistin   Chloramphenicol Goody's Phenylbutazone   Chloromycetin Haltrain Piroxlcam   Clmetidine heparin Plaquenil   Cllnoril Hyco-pap Ponstel   Clofibrate Hydroxy chloroquine Propoxyphen         Before stopping any of these medications, be sure to consult the physician who ordered them.  Some, such as Coumadin (Warfarin) are ordered to prevent or treat serious conditions such as "deep thrombosis", "pumonary embolisms", and other heart problems.  The amount of time that you may need off of the medication may also vary with the medication  and the reason for which you were taking it.  If you are taking any of these medications, please make sure you notify your pain physician before you undergo any procedures.

## 2015-06-28 NOTE — Progress Notes (Signed)
Subjective:    Patient ID: [name], female    DOB: 1946-03-20, 69 y.o.   MRN: [id number]  HPI  PROCEDURE:  Block of nerves to the sacroiliac joint.   NOTE:  The patient is a 69 y.o. female who returns to the [facility] for further evaluation and treatment of pain involving the lower back and lower extremity region with pain in the region of the buttocks as well. Prior MRI studies revealed denerative disc disease lumbar spine Multilevel degenerative changes lumbar spine, annular disc bulging L4-L5, central disc protrusion L5-S1, L3-4 prominent flattening of the lateral recess and left paracentral disc protrusion.  patient is with reproduction of severe pain with palpation over the PSIS and PII S region and is with positive Patrick's maneuver is well. There is concern regarding a significant component of the patient's pain being due to sacroiliac joint dysfunction The risks, benefits, expectations of the procedure have been discussed and explained to the patient who is understanding and willing to proceed with interventional treatment in attempt to decrease severity of patient's symptoms, minimize the risk of medication escalation and  hopefully retard the progression of the patient's symptoms. We will proceed with what is felt to be a medically necessary procedure, block of nerves to the sacroiliac joint.   DESCRIPTION OF PROCEDURE:  Block of nerves to the sacroiliac joint.   The patient was taken to the fluoroscopy suite. With the patient in the prone position with EKG, blood pressure, pulse and pulse oximetry monitoring, IV Versed, IV fentanyl conscious sedation, Betadine prep of proposed entry site was performed.   Block of nerves at the L5 vertebral body level.   With the patient in prone position, under fluoroscopic guidance, a 22 -gauge needle was inserted at the L5 vertebral body level on the left side. With 15 degrees oblique orientation a 22 -gauge needle was inserted in  the region known as Burton's eye or eye of the Scotty dog. Following documentation of needle placement in the area of Burton's eye or eye of the Scotty dog under fluoroscopic guidance, needle placement was then accomplished at the sacral ala level on the left side.   Needle placement at the sacral ala.   With the patient in prone position under fluoroscopic guidance with AP view of the lumbosacral spine, a 22 -gauge needle was inserted in the region known as the sacral ala on the left side. Following documentation of needle placement on the left side under fluoroscopic guidance needle placement was then accomplished at the S1 foramen level.   Needle placement at the S1 foramen level.   With the patient in prone position under fluoroscopic guidance with AP view of the lumbosacral spine and cephalad orientation, a 22 -gauge needle was inserted at the superior and lateral border of the S1 foramen on the left side. Following documentation of needle placement at the S1 foramen level on the left side, needle placement was then accomplished at the S2 foramen level on the left side.   Needle placement at the S2 foramen level.   With the patient in prone position with AP view of the lumbosacral spine with cephalad orientation, a 22 - gauge needle was inserted at the superior and lateral border of the S2 foramen under fluoroscopic guidance on the left side. Following needle placement at the L5 vertebral body level, sacral ala, S1 foramen and S2 foramen on the left side, needle placement was verified on lateral view under fluoroscopic guidance.  Following needle  placement documentation on lateral view, each needle was injected with 1 mL of 0.25% bupivacaine and Kenalog.   BLOCK OF THE NERVES TO SACROILIAC JOINT ON THE RIGHT SIDE The procedure was performed on the right side at the same levels as was performed on the left side and utilizing the same technique as on the left side and was performed under  fluoroscopic guidance as on the left side   A total of '10mg'$  of Kenalog was utilized for the procedure.   PLAN:  1. Medications: The patient will continue presently prescribed medications Neurontin and oxycodone 2. The patient will be considered for modification of treatment regimen pending response to the procedure performed on today's visit.  3. The patient is to follow-up with primary care physician Dr. Lisette Grinder III  for evaluation of blood pressure and general medical condition following the procedure performed on today's visit.  4. Surgical evaluation as discussed.  5. Neurological evaluation as discussed.  6. The patient may be a candidate for radiofrequency procedures, implantation devices and other treatment pending response to treatment performed on today's visit and follow-up evaluation.  7. The patient has been advised to adhere to proper body mechanics and to avoid activities which may exacerbate the patient's symptoms.   [facility] appointment [facility] as scheduled.        Review of Systems     Objective:   Physical Exam        Assessment & Plan:

## 2015-06-28 NOTE — Progress Notes (Signed)
   Subjective:    Patient ID: [name], female    DOB: Aug 11, 1946, 69 y.o.   MRN: [id number]  HPI    Review of Systems     Objective:   Physical Exam        Assessment & Plan:

## 2015-06-29 ENCOUNTER — Telehealth: Payer: Self-pay

## 2015-06-29 ENCOUNTER — Telehealth: Payer: Self-pay | Admitting: *Deleted

## 2015-06-29 NOTE — Telephone Encounter (Signed)
Patient states she is doing fine.   

## 2015-07-08 ENCOUNTER — Ambulatory Visit: Payer: PPO | Attending: Pain Medicine | Admitting: Pain Medicine

## 2015-07-08 ENCOUNTER — Encounter: Payer: Self-pay | Admitting: Pain Medicine

## 2015-07-08 VITALS — BP 125/92 | HR 58 | Temp 98.5°F | Resp 16 | Ht <= 58 in | Wt 187.0 lb

## 2015-07-08 DIAGNOSIS — M179 Osteoarthritis of knee, unspecified: Secondary | ICD-10-CM | POA: Diagnosis not present

## 2015-07-08 DIAGNOSIS — M5030 Other cervical disc degeneration, unspecified cervical region: Secondary | ICD-10-CM | POA: Insufficient documentation

## 2015-07-08 DIAGNOSIS — M5136 Other intervertebral disc degeneration, lumbar region: Secondary | ICD-10-CM

## 2015-07-08 DIAGNOSIS — M170 Bilateral primary osteoarthritis of knee: Secondary | ICD-10-CM

## 2015-07-08 DIAGNOSIS — M79604 Pain in right leg: Secondary | ICD-10-CM | POA: Diagnosis present

## 2015-07-08 DIAGNOSIS — M533 Sacrococcygeal disorders, not elsewhere classified: Secondary | ICD-10-CM

## 2015-07-08 DIAGNOSIS — M47816 Spondylosis without myelopathy or radiculopathy, lumbar region: Secondary | ICD-10-CM | POA: Insufficient documentation

## 2015-07-08 DIAGNOSIS — M5126 Other intervertebral disc displacement, lumbar region: Secondary | ICD-10-CM | POA: Insufficient documentation

## 2015-07-08 DIAGNOSIS — M48062 Spinal stenosis, lumbar region with neurogenic claudication: Secondary | ICD-10-CM

## 2015-07-08 DIAGNOSIS — M545 Low back pain: Secondary | ICD-10-CM | POA: Diagnosis present

## 2015-07-08 DIAGNOSIS — M1610 Unilateral primary osteoarthritis, unspecified hip: Secondary | ICD-10-CM | POA: Insufficient documentation

## 2015-07-08 DIAGNOSIS — Z720 Tobacco use: Secondary | ICD-10-CM

## 2015-07-08 DIAGNOSIS — M7060 Trochanteric bursitis, unspecified hip: Secondary | ICD-10-CM

## 2015-07-08 DIAGNOSIS — M79605 Pain in left leg: Secondary | ICD-10-CM | POA: Diagnosis present

## 2015-07-08 MED ORDER — GABAPENTIN 300 MG PO CAPS: ORAL_CAPSULE | ORAL | Status: DC

## 2015-07-08 MED ORDER — OXYCODONE HCL 5 MG PO CAPS: ORAL_CAPSULE | ORAL | Status: DC

## 2015-07-08 NOTE — Progress Notes (Signed)
[facility] report on chart

## 2015-07-08 NOTE — Patient Instructions (Addendum)
PLAN   Continue present medicationNeurontin and oxycodone  Lumbar epidural steroid injection to be performed at time return appointment  F/U PCP Dr. Lisette Grinder  III for evaliation of  BP and general medical  condition  F/U surgical evaluation. May consider pending follow-up evaluations  F/U neurological evaluation. May consider pending follow-up evaluations  May consider radiofrequency rhizolysis or intraspinal procedures pending response to present treatment and F/U evaluation   Patient to call [facility] should patient have concerns prior to scheduled return appointment. Pain Management Discharge Instructions  General Discharge Instructions :  If you need to reach your doctor call: Monday-Friday 8:00 am - 4:00 pm at [phone] or toll free [phone].  After clinic hours [phone] to have operator reach doctor.  Bring all of your medication bottles to all your appointments in the pain clinic.  To cancel or reschedule your appointment with Pain Management please remember to call 24 hours in advance to avoid a fee.  Refer to the educational materials which you have been given on: General Risks, I had my Procedure. Discharge Instructions, Post Sedation.  Post Procedure Instructions:  The drugs you were given will stay in your system until tomorrow, so for the next 24 hours you should not drive, make any legal decisions or drink any alcoholic beverages.  You may eat anything you prefer, but it is better to start with liquids then soups and crackers, and gradually work up to solid foods.  Please notify your doctor immediately if you have any unusual bleeding, trouble breathing or pain that is not related to your normal pain.  Depending on the type of procedure that was done, some parts of your body may feel week and/or numb.  This usually clears up by tonight or the next day.  Walk with the use of an assistive device or accompanied by an adult for the 24  hours.  You may use ice on the affected area for the first 24 hours.  Put ice in a Ziploc bag and cover with a towel and place against area 15 minutes on 15 minutes off.  You may switch to heat after 24 hours.GENERAL RISKS AND COMPLICATIONS  What are the risk, side effects and possible complications? Generally speaking, most procedures are safe.  However, with any procedure there are risks, side effects, and the possibility of complications.  The risks and complications are dependent upon the sites that are lesioned, or the type of nerve block to be performed.  The closer the procedure is to the spine, the more serious the risks are.  Great care is taken when placing the radio frequency needles, block needles or lesioning probes, but sometimes complications can occur. 1. Infection: Any time there is an injection through the skin, there is a risk of infection.  This is why sterile conditions are used for these blocks.  There are four possible types of infection. 1. Localized skin infection. 2. Central Nervous System Infection-This can be in the form of Meningitis, which can be deadly. 3. Epidural Infections-This can be in the form of an epidural abscess, which can cause pressure inside of the spine, causing compression of the spinal cord with subsequent paralysis. This would require an emergency surgery to decompress, and there are no guarantees that the patient would recover from the paralysis. 4. Discitis-This is an infection of the intervertebral discs.  It occurs in about 1% of discography procedures.  It is difficult to treat and it may lead to surgery.  2. Pain: the needles have to go through skin and soft tissues, will cause soreness.       3. Damage to internal structures:  The nerves to be lesioned may be near blood vessels or    other nerves which can be potentially damaged.       4. Bleeding: Bleeding is more common if the patient is taking blood thinners such as  aspirin, Coumadin,  Ticiid, Plavix, etc., or if he/she have some genetic predisposition  such as hemophilia. Bleeding into the spinal canal can cause compression of the spinal  cord with subsequent paralysis.  This would require an emergency surgery to  decompress and there are no guarantees that the patient would recover from the  paralysis.       5. Pneumothorax:  Puncturing of a lung is a possibility, every time a needle is introduced in  the area of the chest or upper back.  Pneumothorax refers to free air around the  collapsed lung(s), inside of the thoracic cavity (chest cavity).  Another two possible  complications related to a similar event would include: Hemothorax and Chylothorax.   These are variations of the Pneumothorax, where instead of air around the collapsed  lung(s), you may have blood or chyle, respectively.       6. Spinal headaches: They may occur with any procedures in the area of the spine.       7. Persistent CSF (Cerebro-Spinal Fluid) leakage: This is a rare problem, but may occur  with prolonged intrathecal or epidural catheters either due to the formation of a fistulous  track or a dural tear.       8. Nerve damage: By working so close to the spinal cord, there is always a possibility of  nerve damage, which could be as serious as a permanent spinal cord injury with  paralysis.       9. Death:  Although rare, severe deadly allergic reactions known as "Anaphylactic  reaction" can occur to any of the medications used.      10. Worsening of the symptoms:  We can always make thing worse.  What are the chances of something like this happening? Chances of any of this occuring are extremely low.  By statistics, you have more of a chance of getting killed in a motor vehicle accident: while driving to the hospital than any of the above occurring .  Nevertheless, you should be aware that they are possibilities.  In general, it is similar to taking a shower.  Everybody knows that you can slip, hit your head and  get killed.  Does that mean that you should not shower again?  Nevertheless always keep in mind that statistics do not mean anything if you happen to be on the wrong side of them.  Even if a procedure has a 1 (one) in a 1,000,000 (million) chance of going wrong, it you happen to be that one..Also, keep in mind that by statistics, you have more of a chance of having something go wrong when taking medications.  Who should not have this procedure? If you are on a blood thinning medication (e.g. Coumadin, Plavix, see list of "Blood Thinners"), or if you have an active infection going on, you should not have the procedure.  If you are taking any blood thinners, please inform your physician.  How should I prepare for this procedure?  Do not eat or drink anything at least six hours prior to the procedure.  Bring a driver  with you .  It cannot be a taxi.  Come accompanied by an adult that can drive you back, and that is strong enough to help you if your legs get weak or numb from the local anesthetic.  Take all of your medicines the morning of the procedure with just enough water to swallow them.  If you have diabetes, make sure that you are scheduled to have your procedure done first thing in the morning, whenever possible.  If you have diabetes, take only half of your insulin dose and notify our nurse that you have done so as soon as you arrive at the clinic.  If you are diabetic, but only take blood sugar pills (oral hypoglycemic), then do not take them on the morning of your procedure.  You may take them after you have had the procedure.  Do not take aspirin or any aspirin-containing medications, at least eleven (11) days prior to the procedure.  They may prolong bleeding.  Wear loose fitting clothing that may be easy to take off and that you would not mind if it got stained with Betadine or blood.  Do not wear any jewelry or perfume  Remove any nail coloring.  It will interfere with some of  our monitoring equipment.  NOTE: Remember that this is not meant to be interpreted as a complete list of all possible complications.  Unforeseen problems may occur.  BLOOD THINNERS The following drugs contain aspirin or other products, which can cause increased bleeding during surgery and should not be taken for 2 weeks prior to and 1 week after surgery.  If you should need take something for relief of minor pain, you may take acetaminophen which is found in Tylenol,m Datril, Anacin-3 and Panadol. It is not blood thinner. The products listed below are.  Do not take any of the products listed below in addition to any listed on your instruction sheet.  A.P.C or A.P.C with Codeine Codeine Phosphate Capsules #3 Ibuprofen Ridaura  ABC compound Congesprin Imuran rimadil  Advil Cope Indocin Robaxisal  Alka-Seltzer Effervescent Pain Reliever and Antacid Coricidin or Coricidin-D  Indomethacin Rufen  Alka-Seltzer plus Cold Medicine Cosprin Ketoprofen S-A-C Tablets  Anacin Analgesic Tablets or Capsules Coumadin Korlgesic Salflex  Anacin Extra Strength Analgesic tablets or capsules CP-2 Tablets Lanoril Salicylate  Anaprox Cuprimine Capsules Levenox Salocol  Anexsia-D Dalteparin Magan Salsalate  Anodynos Darvon compound Magnesium Salicylate Sine-off  Ansaid Dasin Capsules Magsal Sodium Salicylate  Anturane Depen Capsules Marnal Soma  APF Arthritis pain formula Dewitt's Pills Measurin Stanback  Argesic Dia-Gesic Meclofenamic Sulfinpyrazone  Arthritis Bayer Timed Release Aspirin Diclofenac Meclomen Sulindac  Arthritis pain formula Anacin Dicumarol Medipren Supac  Analgesic (Safety coated) Arthralgen Diffunasal Mefanamic Suprofen  Arthritis Strength Bufferin Dihydrocodeine Mepro Compound Suprol  Arthropan liquid Dopirydamole Methcarbomol with Aspirin Synalgos  ASA tablets/Enseals Disalcid Micrainin Tagament  Ascriptin Doan's Midol Talwin  Ascriptin A/D Dolene Mobidin Tanderil  Ascriptin Extra Strength  Dolobid Moblgesic Ticlid  Ascriptin with Codeine Doloprin or Doloprin with Codeine Momentum Tolectin  Asperbuf Duoprin Mono-gesic Trendar  Aspergum Duradyne Motrin or Motrin IB Triminicin  Aspirin plain, buffered or enteric coated Durasal Myochrisine Trigesic  Aspirin Suppositories Easprin Nalfon Trillsate  Aspirin with Codeine Ecotrin Regular or Extra Strength Naprosyn Uracel  Atromid-S Efficin Naproxen Ursinus  Auranofin Capsules Elmiron Neocylate Vanquish  Axotal Emagrin Norgesic Verin  Azathioprine Empirin or Empirin with Codeine Normiflo Vitamin E  Azolid Emprazil Nuprin Voltaren  Bayer Aspirin plain, buffered or children's or timed BC Tablets or powders  Encaprin Orgaran Warfarin Sodium  Buff-a-Comp Enoxaparin Orudis Zorpin  Buff-a-Comp with Codeine Equegesic Os-Cal-Gesic   Buffaprin Excedrin plain, buffered or Extra Strength Oxalid   Bufferin Arthritis Strength Feldene Oxphenbutazone   Bufferin plain or Extra Strength Feldene Capsules Oxycodone with Aspirin   Bufferin with Codeine Fenoprofen Fenoprofen Pabalate or Pabalate-SF   Buffets II Flogesic Panagesic   Buffinol plain or Extra Strength Florinal or Florinal with Codeine Panwarfarin   Buf-Tabs Flurbiprofen Penicillamine   Butalbital Compound Four-way cold tablets Penicillin   Butazolidin Fragmin Pepto-Bismol   Carbenicillin Geminisyn Percodan   Carna Arthritis Reliever Geopen Persantine   Carprofen Gold's salt Persistin   Chloramphenicol Goody's Phenylbutazone   Chloromycetin Haltrain Piroxlcam   Clmetidine heparin Plaquenil   Cllnoril Hyco-pap Ponstel   Clofibrate Hydroxy chloroquine Propoxyphen         Before stopping any of these medications, be sure to consult the physician who ordered them.  Some, such as Coumadin (Warfarin) are ordered to prevent or treat serious conditions such as "deep thrombosis", "pumonary embolisms", and other heart problems.  The amount of time that you may need off of the medication may also  vary with the medication and the reason for which you were taking it.  If you are taking any of these medications, please make sure you notify your pain physician before you undergo any procedures.         Epidural Steroid Injection Patient Information  Description: The epidural space surrounds the nerves as they exit the spinal cord.  In some patients, the nerves can be compressed and inflamed by a bulging disc or a tight spinal canal (spinal stenosis).  By injecting steroids into the epidural space, we can bring irritated nerves into direct contact with a potentially helpful medication.  These steroids act directly on the irritated nerves and can reduce swelling and inflammation which often leads to decreased pain.  Epidural steroids may be injected anywhere along the spine and from the neck to the low back depending upon the location of your pain.   After numbing the skin with local anesthetic (like Novocaine), a small needle is passed into the epidural space slowly.  You may experience a sensation of pressure while this is being done.  The entire block usually last less than 10 minutes.  Conditions which may be treated by epidural steroids:   Low back and leg pain  Neck and arm pain  Spinal stenosis  Post-laminectomy syndrome  Herpes zoster (shingles) pain  Pain from compression fractures  Preparation for the injection:  1. Do not eat any solid food or dairy products within 6 hours of your appointment.  2. You may drink clear liquids up to 2 hours before appointment.  Clear liquids include water, black coffee, juice or soda.  No milk or cream please. 3. You may take your regular medication, including pain medications, with a sip of water before your appointment  Diabetics should hold regular insulin (if taken separately) and take 1/2 normal NPH dos the morning of the procedure.  Carry some sugar containing items with you to your appointment. 4. A driver must accompany you and  be prepared to drive you home after your procedure.  5. Bring all your current medications with your. 6. An IV may be inserted and sedation may be given at the discretion of the physician.   7. A blood pressure cuff, EKG and other monitors will often be applied during the procedure.  Some patients may need to have extra oxygen  administered for a short period. 8. You will be asked to provide medical information, including your allergies, prior to the procedure.  We must know immediately if you are taking blood thinners (like Coumadin/Warfarin)  Or if you are allergic to IV iodine contrast (dye). We must know if you could possible be pregnant.  Possible side-effects:  Bleeding from needle site  Infection (rare, may require surgery)  Nerve injury (rare)  Numbness & tingling (temporary)  Difficulty urinating (rare, temporary)  Spinal headache ( a headache worse with upright posture)  Light -headedness (temporary)  Pain at injection site (several days)  Decreased blood pressure (temporary)  Weakness in arm/leg (temporary)  Pressure sensation in back/neck (temporary)  Call if you experience:  Fever/chills associated with headache or increased back/neck pain.  Headache worsened by an upright position.  New onset weakness or numbness of an extremity below the injection site  Hives or difficulty breathing (go to the emergency room)  Inflammation or drainage at the infection site  Severe back/neck pain  Any new symptoms which are concerning to you  Please note:  Although the local anesthetic injected can often make your back or neck feel good for several hours after the injection, the pain will likely return.  It takes 3-7 days for steroids to work in the epidural space.  You may not notice any pain relief for at least that one week.  If effective, we will often do a series of three injections spaced 3-6 weeks apart to maximally decrease your pain.  After the initial series,  we generally will wait several months before considering a repeat injection of the same type.  If you have any questions, please call [phone] [facility]

## 2015-07-08 NOTE — Progress Notes (Signed)
Safety precautions to be maintained throughout the outpatient stay will include: orient to surroundings, keep bed in low position, maintain call bell within reach at all times, provide assistance with transfer out of bed and ambulation.  

## 2015-07-08 NOTE — Progress Notes (Signed)
   Subjective:    Patient ID: [name], female    DOB: 04/28/1946, 69 y.o.   MRN: [id number]  HPI  Patient is 69 year old female returns to [place] for further evaluation and treatment of pain involving the lower back and lower extremity region. Patient states that she has lower back pain of lesser degree and that her lower extremity weakness appears to be more significant. Patient denies any definite trauma change in events of daily living the call significant changes resulting. Patient states the pain becomes more intense as well as weakness becomes more severe as patient spends more time on the feet. We discussed patient's condition will consider patient interventional treatment consisting of lumbar epidural steroid injection to be performed at time return appointment in attempt to decrease severity of symptoms, minimize progression of symptoms, and avoid need for more involved treatment. The patient has undergone surgical evaluation as an is considered to be a nonsurgical candidate due to general medical condition. We will proceed with lumbar epidural steroid injection and will continue presently prescribed medications. Patient was understanding and agreement with suggested treatment plan.    Review of Systems     Objective:   Physical Exam  There was tenderness over the splenius capitis and occipitalis region of mild to moderate degree. There was mild to moderate tenderness of the cervical facet cervical paraspinal musculature region. Patient appeared to be with mild to moderate tenderness of the clavicular glenohumeral joint region. There was unremarkable Spurling's maneuver. Tinel and Phalen's maneuver were without increased pain of significant degree. Patient appeared to be with bilaterally equal grip strength. Palpation over the thoracic facet thoracic paraspinal muscles reproduced pain of moderate degree in the lower thoracic paraspinal musculature region. No crepitus of  the thoracic region was noted. Palpation over the lumbar paraspinal muscles lumbar facet region associated with moderate moderately severe discomfort. There was moderate tenderness of the PSIS PII S regions as well as the gluteal and piriformis muscles region. There was mild tends of the greater trochanteric region and iliotibial band regions. Straight leg raising was tolerates approximately 20 without a definite increased pain with dorsiflexion noted. There was negative clonus negative Homans. There was tends to palpation of the gluteal and piriformis musculature region of moderate degree. EHL strength appeared to be decreased. Abdomen nontender no costovertebral maintenance noted.      Assessment & Plan:    Denerative disc disease lumbar spine Multilevel degenerative changes lumbar spine, annular disc bulging L4-L5, central disc protrusion L5-S1, L3-4 prominent flattening of the lateral recess and left paracentral disc protrusion  Lumbar facet syndrome  Sacroiliac joint dysfunction  Degenerative joint disease of knee  Degenerative joint disease of hip  Degenerative disc disease cervical spine    PLAN   Continue present medication Neurontin and oxycodone  Lumbar epidural steroid injection to be performed at time return appointment  F/U PCP Dr. Lisette Grinder III  for evaliation of  BP and general medical  condition  F/U surgical evaluation. May consider pending follow-up evaluation. Patient is undergone prior surgical evaluation and is considered to be a nonsurgical candidate   F/U neurological evaluation. May consider pending follow-up evaluations  May consider radiofrequency rhizolysis or intraspinal procedures pending response to present treatment and F/U evaluation   Patient to call Pain Management Center should patient have concerns prior to scheduled return appointment.

## 2015-07-19 ENCOUNTER — Ambulatory Visit: Payer: PPO | Attending: Pain Medicine | Admitting: Pain Medicine

## 2015-07-19 ENCOUNTER — Encounter: Payer: Self-pay | Admitting: Pain Medicine

## 2015-07-19 VITALS — BP 116/74 | HR 57 | Temp 98.1°F | Resp 16

## 2015-07-19 DIAGNOSIS — M47816 Spondylosis without myelopathy or radiculopathy, lumbar region: Secondary | ICD-10-CM | POA: Diagnosis not present

## 2015-07-19 DIAGNOSIS — M172 Bilateral post-traumatic osteoarthritis of knee: Secondary | ICD-10-CM

## 2015-07-19 DIAGNOSIS — M79605 Pain in left leg: Secondary | ICD-10-CM | POA: Diagnosis present

## 2015-07-19 DIAGNOSIS — M5126 Other intervertebral disc displacement, lumbar region: Secondary | ICD-10-CM | POA: Insufficient documentation

## 2015-07-19 DIAGNOSIS — M48062 Spinal stenosis, lumbar region with neurogenic claudication: Secondary | ICD-10-CM

## 2015-07-19 DIAGNOSIS — M170 Bilateral primary osteoarthritis of knee: Secondary | ICD-10-CM

## 2015-07-19 DIAGNOSIS — M5136 Other intervertebral disc degeneration, lumbar region: Secondary | ICD-10-CM | POA: Diagnosis not present

## 2015-07-19 DIAGNOSIS — M7060 Trochanteric bursitis, unspecified hip: Secondary | ICD-10-CM

## 2015-07-19 DIAGNOSIS — M5030 Other cervical disc degeneration, unspecified cervical region: Secondary | ICD-10-CM

## 2015-07-19 DIAGNOSIS — M533 Sacrococcygeal disorders, not elsewhere classified: Secondary | ICD-10-CM

## 2015-07-19 DIAGNOSIS — M545 Low back pain: Secondary | ICD-10-CM | POA: Diagnosis present

## 2015-07-19 DIAGNOSIS — M79604 Pain in right leg: Secondary | ICD-10-CM | POA: Diagnosis present

## 2015-07-19 MED ORDER — TRIAMCINOLONE ACETONIDE 40 MG/ML IJ SUSP
INTRAMUSCULAR | Status: AC
  Administered 2015-07-19: 10:00:00
  Filled 2015-07-19: qty 1

## 2015-07-19 MED ORDER — CEFUROXIME AXETIL 250 MG PO TABS: 250.0000 mg | ORAL_TABLET | Freq: Two times a day (BID) | ORAL | Status: DC

## 2015-07-19 MED ORDER — FENTANYL CITRATE (PF) 100 MCG/2ML IJ SOLN
INTRAMUSCULAR | Status: AC
  Administered 2015-07-19: 50 ug via INTRAVENOUS
  Filled 2015-07-19: qty 2

## 2015-07-19 MED ORDER — CEFAZOLIN SODIUM 1 G IJ SOLR
INTRAMUSCULAR | Status: AC
  Administered 2015-07-19: 1 g via INTRAVENOUS
  Filled 2015-07-19: qty 10

## 2015-07-19 MED ORDER — ORPHENADRINE CITRATE 30 MG/ML IJ SOLN
INTRAMUSCULAR | Status: AC
Start: 2015-07-19 — End: 2015-07-19
  Administered 2015-07-19: 10:00:00
  Filled 2015-07-19: qty 2

## 2015-07-19 MED ORDER — LIDOCAINE HCL (PF) 1 % IJ SOLN
INTRAMUSCULAR | Status: AC
  Administered 2015-07-19: 10:00:00
  Filled 2015-07-19: qty 5

## 2015-07-19 MED ORDER — MIDAZOLAM HCL 5 MG/5ML IJ SOLN
INTRAMUSCULAR | Status: AC
  Administered 2015-07-19: 2 mg via INTRAVENOUS
  Filled 2015-07-19: qty 5

## 2015-07-19 MED ORDER — SODIUM CHLORIDE 0.9 % IJ SOLN
INTRAMUSCULAR | Status: AC
  Administered 2015-07-19: 10:00:00
  Filled 2015-07-19: qty 20

## 2015-07-19 MED ORDER — BUPIVACAINE HCL (PF) 0.25 % IJ SOLN
INTRAMUSCULAR | Status: AC
  Administered 2015-07-19: 10:00:00
  Filled 2015-07-19: qty 30

## 2015-07-19 NOTE — Progress Notes (Signed)
   Subjective:    Patient ID: [name], female    DOB: [birth year], 69 y.o.   MRN: [id number]  HPI  PROCEDURE PERFORMED: Lumbar epidural steroid injection   NOTE: The patient is a 69 y.o. female who returns to [place] for further evaluation and treatment of pain involving the lumbar and lower extremity region. MRI revealed the patient to be with denerative disc disease lumbar spine Multilevel degenerative changes lumbar spine, annular disc bulging L4-L5, central disc protrusion L5-S1, L3-4 prominent flattening of the lateral recess and left paracentral disc protrusion. The risks, benefits, and expectations of the procedure have been discussed and explained to the patient who was understanding and in agreement with suggested treatment plan. We will proceed with lumbar epidural steroid injection as discussed and as explained to the patient who is willing to proceed with procedure as planned.   DESCRIPTION OF PROCEDURE: Lumbar epidural steroid injection with IV Versed, IV fentanyl conscious sedation, EKG, blood pressure, pulse, and pulse oximetry monitoring. The procedure was performed with the patient in the prone position under fluoroscopic guidance. A local anesthetic skin wheal of 1.5% plain lidocaine was accomplished at proposed entry site. An 18-gauge Tuohy epidural needle was inserted at the L 4 vertebral body level right of the midline via loss-of-resistance technique with negative heme and negative CSF return. A total of 4 mL of Preservative-Free normal saline with 40 mg of Kenalog injected incrementally via epidurally placed needle. Needle was removed.  Myoneural block injections of the lumbar musculature region. Following Betadine prep of proposed entry site a 22-gauge needle was inserted in the lumbar musculature region and following negative aspiration 2 cc of 0.25% bupivacaine with Norflex was injected for myoneural block injection of the lumbar musculature region  4    A total of 40 mg of Kenalog was utilized for the procedure.   The patient tolerated the injection well.    PLAN:   1. Medications: We will continue presently prescribed medications.Neurontin and oxycodone 2. Will consider modification of treatment regimen pending response to treatment rendered on today's visit and follow-up evaluation. 3. The patient is to follow-up with primary care physician Dr. Lisette Grinder III regarding blood pressure and general medical condition status post lumbar epidural steroid injection performed on today's visit. 4. Surgical evaluation. The patient is without plans for surgical evaluation or interventionNeurological evaluation. 5. The patient may be a candidate for radiofrequency procedures, implantation device, and other treatment pending response to treatment and follow-up evaluation. 6. The patient has been advised to adhere to proper body mechanics and avoid activities which appear to aggravate condition. 7. The patient has been advised to call the Pain Management Center prior to scheduled return appointment should there be significant change in condition or should there be sign  The patient is understanding and agrees with the suggested  treatment plan     Review of Systems     Objective:   Physical Exam        Assessment & Plan:

## 2015-07-19 NOTE — Progress Notes (Signed)
Safety precautions to be maintained throughout the outpatient stay will include: orient to surroundings, keep bed in low position, maintain call bell within reach at all times, provide assistance with transfer out of bed and ambulation.  

## 2015-07-19 NOTE — Patient Instructions (Addendum)
PLAN  Continue present medication Neurontin and oxycodone and begin taking antibiotic Ceftin as prescribed. Please obtain your antibiotic Ceftin today and begin taking antibiotic today  F/U PCP Dr. Lisette Grinder III  for evaliation of  BP and general medical  condition.  F/U surgical evaluation. May consider pending follow-up evaluations  F/U neurological evaluation. May consider pending follow-up evaluations  May consider radiofrequency rhizolysis or intraspinal procedures pending response to present treatment and F/U evaluation.  Patient to call Pain Management Center should patient have concerns prior to scheduled return appointment.  Epidural Steroid Injection Patient Information  Description: The epidural space surrounds the nerves as they exit the spinal cord.  In some patients, the nerves can be compressed and inflamed by a bulging disc or a tight spinal canal (spinal stenosis).  By injecting steroids into the epidural space, we can bring irritated nerves into direct contact with a potentially helpful medication.  These steroids act directly on the irritated nerves and can reduce swelling and inflammation which often leads to decreased pain.  Epidural steroids may be injected anywhere along the spine and from the neck to the low back depending upon the location of your pain.   After numbing the skin with local anesthetic (like Novocaine), a small needle is passed into the epidural space slowly.  You may experience a sensation of pressure while this is being done.  The entire block usually last less than 10 minutes.  Conditions which may be treated by epidural steroids:   Low back and leg pain  Neck and arm pain  Spinal stenosis  Post-laminectomy syndrome  Herpes zoster (shingles) pain  Pain from compression fractures  Preparation for the injection:  1. Do not eat any solid food or dairy products within 6 hours of your appointment.  2. You may drink clear liquids up to 2  hours before appointment.  Clear liquids include water, black coffee, juice or soda.  No milk or cream please. 3. You may take your regular medication, including pain medications, with a sip of water before your appointment  Diabetics should hold regular insulin (if taken separately) and take 1/2 normal NPH dos the morning of the procedure.  Carry some sugar containing items with you to your appointment. 4. A driver must accompany you and be prepared to drive you home after your procedure.  5. Bring all your current medications with your. 6. An IV may be inserted and sedation may be given at the discretion of the physician.   7. A blood pressure cuff, EKG and other monitors will often be applied during the procedure.  Some patients may need to have extra oxygen administered for a short period. 8. You will be asked to provide medical information, including your allergies, prior to the procedure.  We must know immediately if you are taking blood thinners (like Coumadin/Warfarin)  Or if you are allergic to IV iodine contrast (dye). We must know if you could possible be pregnant.  Possible side-effects:  Bleeding from needle site  Infection (rare, may require surgery)  Nerve injury (rare)  Numbness & tingling (temporary)  Difficulty urinating (rare, temporary)  Spinal headache ( a headache worse with upright posture)  Light -headedness (temporary)  Pain at injection site (several days)  Decreased blood pressure (temporary)  Weakness in arm/leg (temporary)  Pressure sensation in back/neck (temporary)  Call if you experience:  Fever/chills associated with headache or increased back/neck pain.  Headache worsened by an upright position.  New onset weakness or numbness  of an extremity below the injection site  Hives or difficulty breathing (go to the emergency room)  Inflammation or drainage at the infection site  Severe back/neck pain  Any new symptoms which are concerning to  you  Please note:  Although the local anesthetic injected can often make your back or neck feel good for several hours after the injection, the pain will likely return.  It takes 3-7 days for steroids to work in the epidural space.  You may not notice any pain relief for at least that one week.  If effective, we will often do a series of three injections spaced 3-6 weeks apart to maximally decrease your pain.  After the initial series, we generally will wait several months before considering a repeat injection of the same type.  If you have any questions, please call [phone] [facility] Dis[facility] Inst[facility]  General Discharge Instructions :  If you need to reach your doctor call: Monday-Friday 8:00 am - 4:00 pm at [phone] or toll free [phone].  After clinic hours [phone] to have operator reach doctor.  Bring all of your medication bottles to all your appointments in the pain clinic.  To cancel or reschedule your appointment with Pain Management please remember to call 24 hours in advance to avoid a fee.  Refer to the educational materials which you have been given on: General Risks, I had my Procedure. Discharge Instructions, Post Sedation.  Post Procedure Instructions:  The drugs you were given will stay in your system until tomorrow, so for the next 24 hours you should not drive, make any legal decisions or drink any alcoholic beverages.  You may eat anything you prefer, but it is better to start with liquids then soups and crackers, and gradually work up to solid foods.  Please notify your doctor immediately if you have any unusual bleeding, trouble breathing or pain that is not related to your normal pain.  Depending on the type of procedure that was done, some parts of your body may feel week and/or numb.  This usually clears up by tonight or the next day.  Walk with the use of an assistive device or  accompanied by an adult for the 24 hours.  You may use ice on the affected area for the first 24 hours.  Put ice in a Ziploc bag and cover with a towel and place against area 15 minutes on 15 minutes off.  You may switch to heat after 24 hours.

## 2015-07-20 ENCOUNTER — Telehealth: Payer: Self-pay | Admitting: *Deleted

## 2015-07-20 NOTE — Telephone Encounter (Signed)
Patient denies any problems or complications from procedure

## 2015-07-21 NOTE — Telephone Encounter (Signed)
completed

## 2015-08-10 ENCOUNTER — Encounter: Payer: Self-pay | Admitting: Pain Medicine

## 2015-08-10 ENCOUNTER — Ambulatory Visit: Payer: PPO | Attending: Pain Medicine | Admitting: Pain Medicine

## 2015-08-10 VITALS — BP 129/86 | HR 62 | Temp 97.7°F | Resp 15 | Ht <= 58 in | Wt 187.0 lb

## 2015-08-10 DIAGNOSIS — M7060 Trochanteric bursitis, unspecified hip: Secondary | ICD-10-CM

## 2015-08-10 DIAGNOSIS — M179 Osteoarthritis of knee, unspecified: Secondary | ICD-10-CM | POA: Diagnosis not present

## 2015-08-10 DIAGNOSIS — M533 Sacrococcygeal disorders, not elsewhere classified: Secondary | ICD-10-CM

## 2015-08-10 DIAGNOSIS — M47816 Spondylosis without myelopathy or radiculopathy, lumbar region: Secondary | ICD-10-CM | POA: Insufficient documentation

## 2015-08-10 DIAGNOSIS — M5126 Other intervertebral disc displacement, lumbar region: Secondary | ICD-10-CM | POA: Diagnosis not present

## 2015-08-10 DIAGNOSIS — J441 Chronic obstructive pulmonary disease with (acute) exacerbation: Secondary | ICD-10-CM

## 2015-08-10 DIAGNOSIS — M172 Bilateral post-traumatic osteoarthritis of knee: Secondary | ICD-10-CM

## 2015-08-10 DIAGNOSIS — M48062 Spinal stenosis, lumbar region with neurogenic claudication: Secondary | ICD-10-CM

## 2015-08-10 DIAGNOSIS — M5030 Other cervical disc degeneration, unspecified cervical region: Secondary | ICD-10-CM | POA: Diagnosis not present

## 2015-08-10 DIAGNOSIS — M1610 Unilateral primary osteoarthritis, unspecified hip: Secondary | ICD-10-CM | POA: Diagnosis not present

## 2015-08-10 DIAGNOSIS — M5136 Other intervertebral disc degeneration, lumbar region: Secondary | ICD-10-CM

## 2015-08-10 DIAGNOSIS — M79605 Pain in left leg: Secondary | ICD-10-CM | POA: Diagnosis present

## 2015-08-10 DIAGNOSIS — M79604 Pain in right leg: Secondary | ICD-10-CM | POA: Diagnosis present

## 2015-08-10 DIAGNOSIS — M170 Bilateral primary osteoarthritis of knee: Secondary | ICD-10-CM

## 2015-08-10 DIAGNOSIS — M545 Low back pain: Secondary | ICD-10-CM | POA: Diagnosis present

## 2015-08-10 MED ORDER — OXYCODONE HCL 5 MG PO CAPS: ORAL_CAPSULE | ORAL | Status: DC

## 2015-08-10 MED ORDER — GABAPENTIN 300 MG PO CAPS: ORAL_CAPSULE | ORAL | Status: DC

## 2015-08-10 NOTE — Patient Instructions (Addendum)
PLAN   Continue present medication Neurontin and oxycodone  F/U PCP Dr. Lisette Grinder III for evaliation of  BP and general medical  condition  F/U surgical evaluation. May consider pending follow-up evaluations  F/U neurological evaluation. May consider pending follow-up evaluations  May consider radiofrequency rhizolysis or intraspinal procedures pending response to present treatment and F/U evaluation   Patient to call [facility] should patient have concerns prior to scheduled return appointment. Radiofrequency Lesioning Radiofrequency lesioning is a procedure that is performed to relieve pain. The procedure is often used for back, neck, or arm pain. Radiofrequency lesioning involves the use of a machine that creates radio waves to make heat. During the procedure, the heat is applied to the nerve that carries the pain signal. The heat damages the nerve and interferes with the pain signal. Pain relief usually lasts for 6 months to 1 year. LET [facility] CARE PROVIDER KNOW ABOUT:  Any allergies you have.  All medicines you are taking, including vitamins, herbs, eye drops, creams, and over-the-counter medicines.  Previous problems you or members of your family have had with the use of anesthetics.  Any blood disorders you have.  Previous surgeries you have had.  Any medical conditions you have.  Whether you are pregnant or may be pregnant. RISKS AND COMPLICATIONS Generally, this is a safe procedure. However, problems may occur, including:  Pain or soreness at the injection site.  Infection at the injection site.  Damage to nerves or blood vessels. BEFORE THE PROCEDURE  Ask your health care provider about:  Changing or stopping your regular medicines. This is especially important if you are taking diabetes medicines or blood thinners.  Taking medicines such as aspirin and ibuprofen. These medicines can thin your blood. Do not take these medicines before your  procedure if your health care provider instructs you not to.  Follow instructions from your health care provider about eating or drinking restrictions.  Plan to have someone take you home after the procedure.  If you go home right after the procedure, plan to have someone with you for 24 hours. PROCEDURE  You will be given one or more of the following:  A medicine to help you relax (sedative).  A medicine to numb the area (local anesthetic).  You will be awake during the procedure. You will need to be able to talk with the health care provider during the procedure.  With the help of a type of X-ray (fluoroscopy), the health care provider will insert a radiofrequency needle into the area to be treated.  Next, a wire that carries the radio waves (electrode) will be put through the radiofrequency needle. An electrical pulse will be sent through the electrode to verify the correct nerve. You will feel a tingling sensation, and you may have muscle twitching.  Then, the tissue that is around the needle tip will be heated by an electric current that is passed using the radiofrequency machine. This will numb the nerves.  A bandage (dressing) will be put on the insertion area after the procedure is done. The procedure may vary among health care providers and hospitals. AFTER THE PROCEDURE  Your blood pressure, heart rate, breathing rate, and blood oxygen level will be monitored often until the medicines you were given have worn off.  Return to your normal activities as directed by your health care provider.   This information is not intended to replace advice given to you by your health care provider. Make sure you discuss  any questions you have with your health care provider.   Document Released: 06/14/2011 Document Revised: 07/07/2015 Document Reviewed: 11/23/2014 Elsevier Interactive Patient Education 2016 [place]  What are the risk, side effects  and possible complications? Generally speaking, most procedures are safe.  However, with any procedure there are risks, side effects, and the possibility of complications.  The risks and complications are dependent upon the sites that are lesioned, or the type of nerve block to be performed.  The closer the procedure is to the spine, the more serious the risks are.  Great care is taken when placing the radio frequency needles, block needles or lesioning probes, but sometimes complications can occur.  Infection: Any time there is an injection through the skin, there is a risk of infection.  This is why sterile conditions are used for these blocks.  There are four possible types of infection.  Localized skin infection.  Central Nervous System Infection-This can be in the form of Meningitis, which can be deadly.  Epidural Infections-This can be in the form of an epidural abscess, which can cause pressure inside of the spine, causing compression of the spinal cord with subsequent paralysis. This would require an emergency surgery to decompress, and there are no guarantees that the patient would recover from the paralysis.  Discitis-This is an infection of the intervertebral discs.  It occurs in about 1% of discography procedures.  It is difficult to treat and it may lead to surgery.        2. Pain: the needles have to go through skin and soft tissues, will cause soreness.       3. Damage to internal structures:  The nerves to be lesioned may be near blood vessels or    other nerves which can be potentially damaged.       4. Bleeding: Bleeding is more common if the patient is taking blood thinners such as  aspirin, Coumadin, Ticiid, Plavix, etc., or if he/she have some genetic predisposition  such as hemophilia. Bleeding into the spinal canal can cause compression of the spinal  cord with subsequent paralysis.  This would require an emergency surgery to  decompress and there are no guarantees that the  patient would recover from the  paralysis.       5. Pneumothorax:  Puncturing of a lung is a possibility, every time a needle is introduced in  the area of the chest or upper back.  Pneumothorax refers to free air around the  collapsed lung(s), inside of the thoracic cavity (chest cavity).  Another two possible  complications related to a similar event would include: Hemothorax and Chylothorax.   These are variations of the Pneumothorax, where instead of air around the collapsed  lung(s), you may have blood or chyle, respectively.       6. Spinal headaches: They may occur with any procedures in the area of the spine.       7. Persistent CSF (Cerebro-Spinal Fluid) leakage: This is a rare problem, but may occur  with prolonged intrathecal or epidural catheters either due to the formation of a fistulous  track or a dural tear.       8. Nerve damage: By working so close to the spinal cord, there is always a possibility of  nerve damage, which could be as serious as a permanent spinal cord injury with  paralysis.       9. Death:  Although rare, severe deadly allergic reactions known as "  Anaphylactic  reaction" can occur to any of the medications used.      10. Worsening of the symptoms:  We can always make thing worse.  What are the chances of something like this happening? Chances of any of this occuring are extremely low.  By statistics, you have more of a chance of getting killed in a motor vehicle accident: while driving to the hospital than any of the above occurring .  Nevertheless, you should be aware that they are possibilities.  In general, it is similar to taking a shower.  Everybody knows that you can slip, hit your head and get killed.  Does that mean that you should not shower again?  Nevertheless always keep in mind that statistics do not mean anything if you happen to be on the wrong side of them.  Even if a procedure has a 1 (one) in a 1,000,000 (million) chance of going wrong, it you happen to  be that one..Also, keep in mind that by statistics, you have more of a chance of having something go wrong when taking medications.  Who should not have this procedure? If you are on a blood thinning medication (e.g. Coumadin, Plavix, see list of "Blood Thinners"), or if you have an active infection going on, you should not have the procedure.  If you are taking any blood thinners, please inform your physician.  How should I prepare for this procedure?  Do not eat or drink anything at least six hours prior to the procedure.  Bring a driver with you .  It cannot be a taxi.  Come accompanied by an adult that can drive you back, and that is strong enough to help you if your legs get weak or numb from the local anesthetic.  Take all of your medicines the morning of the procedure with just enough water to swallow them.  If you have diabetes, make sure that you are scheduled to have your procedure done first thing in the morning, whenever possible.  If you have diabetes, take only half of your insulin dose and notify our nurse that you have done so as soon as you arrive at the clinic.  If you are diabetic, but only take blood sugar pills (oral hypoglycemic), then do not take them on the morning of your procedure.  You may take them after you have had the procedure.  Do not take aspirin or any aspirin-containing medications, at least eleven (11) days prior to the procedure.  They may prolong bleeding.  Wear loose fitting clothing that may be easy to take off and that you would not mind if it got stained with Betadine or blood.  Do not wear any jewelry or perfume  Remove any nail coloring.  It will interfere with some of our monitoring equipment.  NOTE: Remember that this is not meant to be interpreted as a complete list of all possible complications.  Unforeseen problems may occur.  BLOOD THINNERS The following drugs contain aspirin or other products, which can cause increased bleeding  during surgery and should not be taken for 2 weeks prior to and 1 week after surgery.  If you should need take something for relief of minor pain, you may take acetaminophen which is found in Tylenol,m Datril, Anacin-3 and Panadol. It is not blood thinner. The products listed below are.  Do not take any of the products listed below in addition to any listed on your instruction sheet.  A.P.C or A.P.C with Codeine Codeine Phosphate  Capsules #3 Ibuprofen Ridaura  ABC compound Congesprin Imuran rimadil  Advil Cope Indocin Robaxisal  Alka-Seltzer Effervescent Pain Reliever and Antacid Coricidin or Coricidin-D  Indomethacin Rufen  Alka-Seltzer plus Cold Medicine Cosprin Ketoprofen S-A-C Tablets  Anacin Analgesic Tablets or Capsules Coumadin Korlgesic Salflex  Anacin Extra Strength Analgesic tablets or capsules CP-2 Tablets Lanoril Salicylate  Anaprox Cuprimine Capsules Levenox Salocol  Anexsia-D Dalteparin Magan Salsalate  Anodynos Darvon compound Magnesium Salicylate Sine-off  Ansaid Dasin Capsules Magsal Sodium Salicylate  Anturane Depen Capsules Marnal Soma  APF Arthritis pain formula Dewitt's Pills Measurin Stanback  Argesic Dia-Gesic Meclofenamic Sulfinpyrazone  Arthritis Bayer Timed Release Aspirin Diclofenac Meclomen Sulindac  Arthritis pain formula Anacin Dicumarol Medipren Supac  Analgesic (Safety coated) Arthralgen Diffunasal Mefanamic Suprofen  Arthritis Strength Bufferin Dihydrocodeine Mepro Compound Suprol  Arthropan liquid Dopirydamole Methcarbomol with Aspirin Synalgos  ASA tablets/Enseals Disalcid Micrainin Tagament  Ascriptin Doan's Midol Talwin  Ascriptin A/D Dolene Mobidin Tanderil  Ascriptin Extra Strength Dolobid Moblgesic Ticlid  Ascriptin with Codeine Doloprin or Doloprin with Codeine Momentum Tolectin  Asperbuf Duoprin Mono-gesic Trendar  Aspergum Duradyne Motrin or Motrin IB Triminicin  Aspirin plain, buffered or enteric coated Durasal Myochrisine Trigesic  Aspirin  Suppositories Easprin Nalfon Trillsate  Aspirin with Codeine Ecotrin Regular or Extra Strength Naprosyn Uracel  Atromid-S Efficin Naproxen Ursinus  Auranofin Capsules Elmiron Neocylate Vanquish  Axotal Emagrin Norgesic Verin  Azathioprine Empirin or Empirin with Codeine Normiflo Vitamin E  Azolid Emprazil Nuprin Voltaren  Bayer Aspirin plain, buffered or children's or timed BC Tablets or powders Encaprin Orgaran Warfarin Sodium  Buff-a-Comp Enoxaparin Orudis Zorpin  Buff-a-Comp with Codeine Equegesic Os-Cal-Gesic   Buffaprin Excedrin plain, buffered or Extra Strength Oxalid   Bufferin Arthritis Strength Feldene Oxphenbutazone   Bufferin plain or Extra Strength Feldene Capsules Oxycodone with Aspirin   Bufferin with Codeine Fenoprofen Fenoprofen Pabalate or Pabalate-SF   Buffets II Flogesic Panagesic   Buffinol plain or Extra Strength Florinal or Florinal with Codeine Panwarfarin   Buf-Tabs Flurbiprofen Penicillamine   Butalbital Compound Four-way cold tablets Penicillin   Butazolidin Fragmin Pepto-Bismol   Carbenicillin Geminisyn Percodan   Carna Arthritis Reliever Geopen Persantine   Carprofen Gold's salt Persistin   Chloramphenicol Goody's Phenylbutazone   Chloromycetin Haltrain Piroxlcam   Clmetidine heparin Plaquenil   Cllnoril Hyco-pap Ponstel   Clofibrate Hydroxy chloroquine Propoxyphen         Before stopping any of these medications, be sure to consult the physician who ordered them.  Some, such as Coumadin (Warfarin) are ordered to prevent or treat serious conditions such as "deep thrombosis", "pumonary embolisms", and other heart problems.  The amount of time that you may need off of the medication may also vary with the medication and the reason for which you were taking it.  If you are taking any of these medications, please make sure you notify your pain physician before you undergo any procedures.

## 2015-08-10 NOTE — Progress Notes (Signed)
Safety precautions to be maintained throughout the outpatient stay will include: orient to surroundings, keep bed in low position, maintain call bell within reach at all times, provide assistance with transfer out of bed and ambulation.  

## 2015-08-10 NOTE — Progress Notes (Signed)
   Subjective:    Patient ID: [name], female    DOB: [birth year], 69 y.o.   MRN: [id number]  HPI  Patient is 69 year old female returns to [facility] for further evaluation and treatment of pain involving the lower back and lower extremity region predominantly patient states that she has difficulty standing walking twisting turning maneuvers. Patient states she has severe pain with twisting and turning especially patient states the pain also associated with weakness of the lower extremities if patient attempts to stand for any significant period of time or walk for any significant distance. Patient denies any trauma change in events of daily living the call significant change in symptomatology. The patient is undergone surgical evaluation with recommendation to return to [facility] for further evaluation and treatment. We discussed patient's condition and will request permission for patient to be approval for radiofrequency rhizolysis lumbar facet, medial branch nerve radiofrequency rhizolysis since patient has had greater than 50% relief of pain with prior lumbar facet medial branch nerve blocks. We will continue oxycodone and Neurontin and remain available to consider additional modification of treatment as discussed  Review of Systems     Objective:   Physical Exam  There was tenderness of the splenius capitis and occipitalis musculature regions of mild degree with mild tinnitus of the cervical facet cervical paraspinal musculature region. There was mild tenderness of the acromioclavicular and glenohumeral joint regions. Patient appeared to be with bilaterally equal grip strength. Tinel and Phalen's maneuver were without increase of pain of significant degree. There was tenderness over the thoracic facet thoracic paraspinal musculature regions of moderate degree in the lower thoracic region with no crepitus of the thoracic region noted. There was scoliosis of the  thoracic or lumbar spine with severe increase of pain with palpation over the lumbar facet lumbar paraspinal musculature region there was moderate tends to palpation of the PSIS PII S region gluteal and piriformis musculature regions. Straight leg raising was tolerates approximately 20 without increased pain with dorsiflexion. There was no definite sensory deficit of dermatomal distribution detected. There was mild tenderness of the greater trochanteric region iliotibial band region. There was negative clonus negative Homans. Abdomen was nontender with no costovertebral angle tenderness noted    Assessment & Plan:    Denerative disc disease lumbar spine Multilevel degenerative changes lumbar spine, annular disc bulging L4-L5, central disc protrusion L5-S1, L3-4 prominent flattening of the lateral recess and left paracentral disc protrusion  Lumbar facet syndrome  Sacroiliac joint dysfunction  Degenerative joint disease of knee  Degenerative joint disease of hip  Degenerative disc disease cervical spine    PLAN   Continue present medication Neurontin and oxycodone  We will request permission for radiofrequency rhizolysis lumbar facet, medial branch nerve radiofrequency rhizolysis and will await insurance approval for patient to be approval for the procedure  F/U PCP Dr. Lisette Grinder  III for evaliation of  BP and general medical  condition  F/U surgical evaluation. The patient is status post surgical evaluation with recommendation to continue treatment and [facility] without recommendation for surgical intervention  F/U neurological evaluation. May consider pending follow-up evaluations  May consider radiofrequency rhizolysis . The present time we will request insurance approval for radiofrequency rhizolysis lumbar facets, medial branch nerves,  Patient to call [facility] should patient have concerns prior to scheduled return appointment.

## 2015-09-07 ENCOUNTER — Encounter: Payer: Self-pay | Admitting: Pain Medicine

## 2015-09-07 ENCOUNTER — Ambulatory Visit: Payer: PPO | Attending: Pain Medicine | Admitting: Pain Medicine

## 2015-09-07 VITALS — BP 147/76 | HR 67 | Temp 98.2°F | Resp 20 | Ht <= 58 in | Wt 189.0 lb

## 2015-09-07 DIAGNOSIS — M172 Bilateral post-traumatic osteoarthritis of knee: Secondary | ICD-10-CM

## 2015-09-07 DIAGNOSIS — M5030 Other cervical disc degeneration, unspecified cervical region: Secondary | ICD-10-CM | POA: Insufficient documentation

## 2015-09-07 DIAGNOSIS — M545 Low back pain: Secondary | ICD-10-CM | POA: Diagnosis present

## 2015-09-07 DIAGNOSIS — M48062 Spinal stenosis, lumbar region with neurogenic claudication: Secondary | ICD-10-CM

## 2015-09-07 DIAGNOSIS — M79605 Pain in left leg: Secondary | ICD-10-CM | POA: Diagnosis present

## 2015-09-07 DIAGNOSIS — M179 Osteoarthritis of knee, unspecified: Secondary | ICD-10-CM | POA: Insufficient documentation

## 2015-09-07 DIAGNOSIS — M533 Sacrococcygeal disorders, not elsewhere classified: Secondary | ICD-10-CM | POA: Insufficient documentation

## 2015-09-07 DIAGNOSIS — M1610 Unilateral primary osteoarthritis, unspecified hip: Secondary | ICD-10-CM | POA: Insufficient documentation

## 2015-09-07 DIAGNOSIS — M5136 Other intervertebral disc degeneration, lumbar region: Secondary | ICD-10-CM | POA: Insufficient documentation

## 2015-09-07 DIAGNOSIS — M5126 Other intervertebral disc displacement, lumbar region: Secondary | ICD-10-CM | POA: Diagnosis not present

## 2015-09-07 DIAGNOSIS — M7060 Trochanteric bursitis, unspecified hip: Secondary | ICD-10-CM

## 2015-09-07 DIAGNOSIS — M170 Bilateral primary osteoarthritis of knee: Secondary | ICD-10-CM

## 2015-09-07 DIAGNOSIS — M47816 Spondylosis without myelopathy or radiculopathy, lumbar region: Secondary | ICD-10-CM | POA: Diagnosis not present

## 2015-09-07 DIAGNOSIS — M79604 Pain in right leg: Secondary | ICD-10-CM | POA: Diagnosis present

## 2015-09-07 MED ORDER — GABAPENTIN 300 MG PO CAPS: ORAL_CAPSULE | ORAL | Status: DC

## 2015-09-07 MED ORDER — OXYCODONE HCL 5 MG PO CAPS: ORAL_CAPSULE | ORAL | Status: DC

## 2015-09-07 NOTE — Progress Notes (Signed)
   Subjective:    Patient ID: [name], female    DOB: [birth year], 69 y.o.   MRN: [id number]  HPI    The patient is a 69 year old female who returns to pain medicine center for evaluation and treatment of pain involving the region of the the lower back and lower extremity region predominantly the patient is unable to stand or walk for significant period of time due to severe pain of the lumbosacral region. Patient is undergone surgical evaluation with plans for surgical intervention. The patient has been informed that the surgery would be too extensive for patient general medical condition. We will proceed with lumbar facet, medial branch nerve blocks at time return appointment in attempt to decrease severe disabling pain, minimize progression of patient's symptoms, and avoid need for infarct treatment. The patient was in agreement with suggested treatment plan        Review of Systems     Objective:   Physical Exam  There was tenderness of the splenius capitis at the talus musculature regions palpation which reproduces mild discomfort. There was mild tightness of the acromial clavicular and glenohumeral joint regions. Patient appeared to be slightly decreased grip strength and Tinel and Phalen's maneuver without increase of pain of significant degree. There was tenderness over the region of the lumbar paraspinal musculature is lumbosacral region of severe degree with kyphoscoliosis of the thoracolumbar spine noted. Tenderness to palpation is more severe on the right compared to the left. Straight leg raising was limited to 20 without a definite increase of pain with dorsiflexion noted. There was moderate tensed palpation of the PSIS and at best regions. Mild tenderness of the greater trochanteric region and iliotibial band region. No sensory deficit in dermatomal distribution detected. There is severe tenderness to palpation with lateral bending and rotation and palpation over the lumbar  facet lumbar paraspinal musculature region. There was negative clonus negative Homans     ASSESSMENT   Denerative disc disease lumbar spine Multilevel degenerative changes lumbar spine, annular disc bulging L4-L5, central disc protrusion L5-S1, L3-4 prominent flattening of the lateral recess and left paracentral disc protrusion  Lumbar facet syndrome  Sacroiliac joint dysfunction  Degenerative joint disease of knee  Degenerative joint disease of hip  Degenerative disc disease cervical spine Abdomen was nontender with no costovertebral angle tenderness noted    PLAN  Continue present medication Neurontin and oxycodone  Lumbar facet, medial branch nerve, blocks to be performed at time return appointment.  F/U PCP Dr. Lisette Grinder  III for evaliation of  BP and general medical  condition  F/U surgical evaluation. The patient is status post surgical evaluation with recommendation to continue treatment and [facility] without recommendation for surgical intervention  F/U neurological evaluation. May consider pending follow-up evaluations  May consider radiofrequency rhizolysis . At the present time we will request insurance approval for radiofrequency rhizolysis lumbar facets, medial branch nerves,     Assessment & Plan:

## 2015-09-07 NOTE — Patient Instructions (Addendum)
PLAN   Continue present medication Neurontin and oxycodone  Lumbar facet, medial branch nerve, blocks to be performed at time return appointment  F/U PCP Dr. Lisette Grinder III for evaliation of  BP and general medical  condition  F/U surgical evaluation. May consider pending follow-up evaluations  Ask receptionist to you let you  know when you have  been approved for radiofrequency rhizolysis lumbar facets, medial branch nerves  F/U neurological evaluation. May consider pending follow-up evaluations  May consider radiofrequency rhizolysis or intraspinal procedures pending response to present treatment and F/U evaluation   Patient to call Pain Management Center should patient have concerns prior to scheduled return appointment.Facet Blocks Patient Information  Description: The facets are joints in the spine between the vertebrae.  Like any joints in the body, facets can become irritated and painful.  Arthritis can also effect the facets.  By injecting steroids and local anesthetic in and around these joints, we can temporarily block the nerve supply to them.  Steroids act directly on irritated nerves and tissues to reduce selling and inflammation which often leads to decreased pain.  Facet blocks may be done anywhere along the spine from the neck to the low back depending upon the location of your pain.   After numbing the skin with local anesthetic (like Novocaine), a small needle is passed onto the facet joints under x-ray guidance.  You may experience a sensation of pressure while this is being done.  The entire block usually lasts about 15-25 minutes.   Conditions which may be treated by facet blocks:   Low back/buttock pain  Neck/shoulder pain  Certain types of headaches  Preparation for the injection:  1. Do not eat any solid food or dairy products within 6 hours of your appointment. 2. You may drink clear liquid up to 2 hours before appointment.  Clear liquids include water,  black coffee, juice or soda.  No milk or cream please. 3. You may take your regular medication, including pain medications, with a sip of water before your appointment.  Diabetics should hold regular insulin (if taken separately) and take 1/2 normal NPH dose the morning of the procedure.  Carry some sugar containing items with you to your appointment. 4. A driver must accompany you and be prepared to drive you home after your procedure. 5. Bring all your current medications with you. 6. An IV may be inserted and sedation may be given at the discretion of the physician. 7. A blood pressure cuff, EKG and other monitors will often be applied during the procedure.  Some patients may need to have extra oxygen administered for a short period. 8. You will be asked to provide medical information, including your allergies and medications, prior to the procedure.  We must know immediately if you are taking blood thinners (like Coumadin/Warfarin) or if you are allergic to IV iodine contrast (dye).  We must know if you could possible be pregnant.  Possible side-effects:   Bleeding from needle site  Infection (rare, may require surgery)  Nerve injury (rare)  Numbness & tingling (temporary)  Difficulty urinating (rare, temporary)  Spinal headache (a headache worse with upright posture)  Light-headedness (temporary)  Pain at injection site (serveral days)  Decreased blood pressure (rare, temporary)  Weakness in arm/leg (temporary)  Pressure sensation in back/neck (temporary)   Call if you experience:   Fever/chills associated with headache or increased back/neck pain  Headache worsened by an upright position  New onset, weakness or numbness of an  extremity below the injection site  Hives or difficulty breathing (go to the emergency room)  Inflammation or drainage at the injection site(s)  Severe back/neck pain greater than usual  New symptoms which are concerning to you  Please  note:  Although the local anesthetic injected can often make your back or neck feel good for several hours after the injection, the pain will likely return. It takes 3-7 days for steroids to work.  You may not notice any pain relief for at least one week.  If effective, we will often do a series of 2-3 injections spaced 3-6 weeks apart to maximally decrease your pain.  After the initial series, you may be a candidate for a more permanent nerve block of the facets.  If you have any questions, please call #[phone]) [facility]

## 2015-09-07 NOTE — Progress Notes (Signed)
Safety precautions to be maintained throughout the outpatient stay will include: orient to surroundings, keep bed in low position, maintain call bell within reach at all times, provide assistance with transfer out of bed and ambulation.  

## 2015-09-13 ENCOUNTER — Ambulatory Visit: Payer: PPO | Attending: Pain Medicine | Admitting: Pain Medicine

## 2015-09-13 VITALS — BP 125/66 | HR 61 | Temp 98.1°F | Resp 18 | Ht <= 58 in | Wt 187.0 lb

## 2015-09-13 DIAGNOSIS — M5126 Other intervertebral disc displacement, lumbar region: Secondary | ICD-10-CM | POA: Diagnosis not present

## 2015-09-13 DIAGNOSIS — M533 Sacrococcygeal disorders, not elsewhere classified: Secondary | ICD-10-CM

## 2015-09-13 DIAGNOSIS — M172 Bilateral post-traumatic osteoarthritis of knee: Secondary | ICD-10-CM

## 2015-09-13 DIAGNOSIS — M48062 Spinal stenosis, lumbar region with neurogenic claudication: Secondary | ICD-10-CM

## 2015-09-13 DIAGNOSIS — M79605 Pain in left leg: Secondary | ICD-10-CM | POA: Diagnosis present

## 2015-09-13 DIAGNOSIS — M47816 Spondylosis without myelopathy or radiculopathy, lumbar region: Secondary | ICD-10-CM

## 2015-09-13 DIAGNOSIS — M170 Bilateral primary osteoarthritis of knee: Secondary | ICD-10-CM

## 2015-09-13 DIAGNOSIS — M7060 Trochanteric bursitis, unspecified hip: Secondary | ICD-10-CM

## 2015-09-13 DIAGNOSIS — M545 Low back pain: Secondary | ICD-10-CM | POA: Diagnosis present

## 2015-09-13 DIAGNOSIS — M5030 Other cervical disc degeneration, unspecified cervical region: Secondary | ICD-10-CM

## 2015-09-13 DIAGNOSIS — M79604 Pain in right leg: Secondary | ICD-10-CM | POA: Diagnosis present

## 2015-09-13 DIAGNOSIS — M5136 Other intervertebral disc degeneration, lumbar region: Secondary | ICD-10-CM

## 2015-09-13 DIAGNOSIS — M51369 Other intervertebral disc degeneration, lumbar region without mention of lumbar back pain or lower extremity pain: Secondary | ICD-10-CM

## 2015-09-13 MED ORDER — MIDAZOLAM HCL 5 MG/5ML IJ SOLN
INTRAMUSCULAR | Status: AC
  Administered 2015-09-13: 3 mg via INTRAVENOUS
  Filled 2015-09-13: qty 5

## 2015-09-13 MED ORDER — TRIAMCINOLONE ACETONIDE 40 MG/ML IJ SUSP: 40.0000 mg | Freq: Once | INTRAMUSCULAR | Status: DC

## 2015-09-13 MED ORDER — CEFAZOLIN SODIUM 1-5 GM-% IV SOLN: 1.0000 g | Freq: Once | INTRAVENOUS | Status: DC

## 2015-09-13 MED ORDER — FENTANYL CITRATE (PF) 100 MCG/2ML IJ SOLN: 100.0000 ug | Freq: Once | INTRAMUSCULAR | Status: DC

## 2015-09-13 MED ORDER — TRIAMCINOLONE ACETONIDE 40 MG/ML IJ SUSP
INTRAMUSCULAR | Status: AC
  Administered 2015-09-13: 13:00:00
  Filled 2015-09-13: qty 1

## 2015-09-13 MED ORDER — MIDAZOLAM HCL 2 MG/2ML IJ SOLN: 5.0000 mg | Freq: Once | INTRAMUSCULAR | Status: DC

## 2015-09-13 MED ORDER — ORPHENADRINE CITRATE 30 MG/ML IJ SOLN
INTRAMUSCULAR | Status: AC
  Administered 2015-09-13: 13:00:00
  Filled 2015-09-13: qty 2

## 2015-09-13 MED ORDER — BUPIVACAINE HCL (PF) 0.25 % IJ SOLN: 30.0000 mL | Freq: Once | INTRAMUSCULAR | Status: DC

## 2015-09-13 MED ORDER — CEFUROXIME AXETIL 250 MG PO TABS: 250.0000 mg | ORAL_TABLET | Freq: Two times a day (BID) | ORAL | Status: DC

## 2015-09-13 MED ORDER — CEFAZOLIN SODIUM 1 G IJ SOLR
INTRAMUSCULAR | Status: AC
  Administered 2015-09-13: 1 g via INTRAVENOUS
  Filled 2015-09-13: qty 10

## 2015-09-13 MED ORDER — ORPHENADRINE CITRATE 30 MG/ML IJ SOLN: 60.0000 mg | Freq: Once | INTRAMUSCULAR | Status: DC

## 2015-09-13 MED ORDER — BUPIVACAINE HCL (PF) 0.25 % IJ SOLN
INTRAMUSCULAR | Status: AC
  Administered 2015-09-13: 13:00:00
  Filled 2015-09-13: qty 30

## 2015-09-13 MED ORDER — FENTANYL CITRATE (PF) 100 MCG/2ML IJ SOLN
INTRAMUSCULAR | Status: AC
  Administered 2015-09-13: 50 ug via INTRAVENOUS
  Filled 2015-09-13: qty 2

## 2015-09-13 MED ORDER — LACTATED RINGERS IV SOLN: 1000.0000 mL | INTRAVENOUS | Status: DC

## 2015-09-13 NOTE — Patient Instructions (Addendum)
PLAN   Continue present medication Neurontin and oxycodone. Please obtain Ceftin antibiotic today and begin taking Ceftin   F/U PCP Dr. Lisette Grinder III for evaliation of  BP and general medical  condition  F/U surgical evaluation. May consider pending follow-up evaluations  Ask receptionist to you let you  know when you have  been approved for radiofrequency rhizolysis lumbar facets, medial branch nerves  F/U neurological evaluation. May consider pending follow-up evaluations  May consider radiofrequency rhizolysis or intraspinal procedures pending response to present treatment and F/U evaluation   Patient to call Pain Management Center should patient have concerns prior to scheduled return appointmentPain Management Discharge Instructions  General Discharge Instructions :  If you need to reach your doctor call: Monday-Friday 8:00 am - 4:00 pm at [phone] or toll free [phone].  After clinic hours [phone] to have operator reach doctor.  Bring all of your medication bottles to all your appointments in the pain clinic.  To cancel or reschedule your appointment with Pain Management please remember to call 24 hours in advance to avoid a fee.  Refer to the educational materials which you have been given on: General Risks, I had my Procedure. Discharge Instructions, Post Sedation.  Post Procedure Instructions:  The drugs you were given will stay in your system until tomorrow, so for the next 24 hours you should not drive, make any legal decisions or drink any alcoholic beverages.  You may eat anything you prefer, but it is better to start with liquids then soups and crackers, and gradually work up to solid foods.  Please notify your doctor immediately if you have any unusual bleeding, trouble breathing or pain that is not related to your normal pain.  Depending on the type of procedure that was done, some parts of your body may feel week and/or numb.  This usually clears  up by tonight or the next day.  Walk with the use of an assistive device or accompanied by an adult for the 24 hours.  You may use ice on the affected area for the first 24 hours.  Put ice in a Ziploc bag and cover with a towel and place against area 15 minutes on 15 minutes off.  You may switch to heat after 24 hours.GENERAL RISKS AND COMPLICATIONS  What are the risk, side effects and possible complications? Generally speaking, most procedures are safe.  However, with any procedure there are risks, side effects, and the possibility of complications.  The risks and complications are dependent upon the sites that are lesioned, or the type of nerve block to be performed.  The closer the procedure is to the spine, the more serious the risks are.  Great care is taken when placing the radio frequency needles, block needles or lesioning probes, but sometimes complications can occur. 1. Infection: Any time there is an injection through the skin, there is a risk of infection.  This is why sterile conditions are used for these blocks.  There are four possible types of infection. 1. Localized skin infection. 2. Central Nervous System Infection-This can be in the form of Meningitis, which can be deadly. 3. Epidural Infections-This can be in the form of an epidural abscess, which can cause pressure inside of the spine, causing compression of the spinal cord with subsequent paralysis. This would require an emergency surgery to decompress, and there are no guarantees that the patient would recover from the paralysis. 4. Discitis-This is an infection of the intervertebral discs.  It occurs in  about 1% of discography procedures.  It is difficult to treat and it may lead to surgery.        2. Pain: the needles have to go through skin and soft tissues, will cause soreness.       3. Damage to internal structures:  The nerves to be lesioned may be near blood vessels or    other nerves which can be potentially damaged.        4. Bleeding: Bleeding is more common if the patient is taking blood thinners such as  aspirin, Coumadin, Ticiid, Plavix, etc., or if he/she have some genetic predisposition  such as hemophilia. Bleeding into the spinal canal can cause compression of the spinal  cord with subsequent paralysis.  This would require an emergency surgery to  decompress and there are no guarantees that the patient would recover from the  paralysis.       5. Pneumothorax:  Puncturing of a lung is a possibility, every time a needle is introduced in  the area of the chest or upper back.  Pneumothorax refers to free air around the  collapsed lung(s), inside of the thoracic cavity (chest cavity).  Another two possible  complications related to a similar event would include: Hemothorax and Chylothorax.   These are variations of the Pneumothorax, where instead of air around the collapsed  lung(s), you may have blood or chyle, respectively.       6. Spinal headaches: They may occur with any procedures in the area of the spine.       7. Persistent CSF (Cerebro-Spinal Fluid) leakage: This is a rare problem, but may occur  with prolonged intrathecal or epidural catheters either due to the formation of a fistulous  track or a dural tear.       8. Nerve damage: By working so close to the spinal cord, there is always a possibility of  nerve damage, which could be as serious as a permanent spinal cord injury with  paralysis.       9. Death:  Although rare, severe deadly allergic reactions known as "Anaphylactic  reaction" can occur to any of the medications used.      10. Worsening of the symptoms:  We can always make thing worse.  What are the chances of something like this happening? Chances of any of this occuring are extremely low.  By statistics, you have more of a chance of getting killed in a motor vehicle accident: while driving to the hospital than any of the above occurring .  Nevertheless, you should be aware that they are  possibilities.  In general, it is similar to taking a shower.  Everybody knows that you can slip, hit your head and get killed.  Does that mean that you should not shower again?  Nevertheless always keep in mind that statistics do not mean anything if you happen to be on the wrong side of them.  Even if a procedure has a 1 (one) in a 1,000,000 (million) chance of going wrong, it you happen to be that one..Also, keep in mind that by statistics, you have more of a chance of having something go wrong when taking medications.  Who should not have this procedure? If you are on a blood thinning medication (e.g. Coumadin, Plavix, see list of "Blood Thinners"), or if you have an active infection going on, you should not have the procedure.  If you are taking any blood thinners, please inform your physician.  How should  I prepare for this procedure?  Do not eat or drink anything at least six hours prior to the procedure.  Bring a driver with you .  It cannot be a taxi.  Come accompanied by an adult that can drive you back, and that is strong enough to help you if your legs get weak or numb from the local anesthetic.  Take all of your medicines the morning of the procedure with just enough water to swallow them.  If you have diabetes, make sure that you are scheduled to have your procedure done first thing in the morning, whenever possible.  If you have diabetes, take only half of your insulin dose and notify our nurse that you have done so as soon as you arrive at the clinic.  If you are diabetic, but only take blood sugar pills (oral hypoglycemic), then do not take them on the morning of your procedure.  You may take them after you have had the procedure.  Do not take aspirin or any aspirin-containing medications, at least eleven (11) days prior to the procedure.  They may prolong bleeding.  Wear loose fitting clothing that may be easy to take off and that you would not mind if it got stained with  Betadine or blood.  Do not wear any jewelry or perfume  Remove any nail coloring.  It will interfere with some of our monitoring equipment.  NOTE: Remember that this is not meant to be interpreted as a complete list of all possible complications.  Unforeseen problems may occur.  BLOOD THINNERS The following drugs contain aspirin or other products, which can cause increased bleeding during surgery and should not be taken for 2 weeks prior to and 1 week after surgery.  If you should need take something for relief of minor pain, you may take acetaminophen which is found in Tylenol,m Datril, Anacin-3 and Panadol. It is not blood thinner. The products listed below are.  Do not take any of the products listed below in addition to any listed on your instruction sheet.  A.P.C or A.P.C with Codeine Codeine Phosphate Capsules #3 Ibuprofen Ridaura  ABC compound Congesprin Imuran rimadil  Advil Cope Indocin Robaxisal  Alka-Seltzer Effervescent Pain Reliever and Antacid Coricidin or Coricidin-D  Indomethacin Rufen  Alka-Seltzer plus Cold Medicine Cosprin Ketoprofen S-A-C Tablets  Anacin Analgesic Tablets or Capsules Coumadin Korlgesic Salflex  Anacin Extra Strength Analgesic tablets or capsules CP-2 Tablets Lanoril Salicylate  Anaprox Cuprimine Capsules Levenox Salocol  Anexsia-D Dalteparin Magan Salsalate  Anodynos Darvon compound Magnesium Salicylate Sine-off  Ansaid Dasin Capsules Magsal Sodium Salicylate  Anturane Depen Capsules Marnal Soma  APF Arthritis pain formula Dewitt's Pills Measurin Stanback  Argesic Dia-Gesic Meclofenamic Sulfinpyrazone  Arthritis Bayer Timed Release Aspirin Diclofenac Meclomen Sulindac  Arthritis pain formula Anacin Dicumarol Medipren Supac  Analgesic (Safety coated) Arthralgen Diffunasal Mefanamic Suprofen  Arthritis Strength Bufferin Dihydrocodeine Mepro Compound Suprol  Arthropan liquid Dopirydamole Methcarbomol with Aspirin Synalgos  ASA tablets/Enseals Disalcid  Micrainin Tagament  Ascriptin Doan's Midol Talwin  Ascriptin A/D Dolene Mobidin Tanderil  Ascriptin Extra Strength Dolobid Moblgesic Ticlid  Ascriptin with Codeine Doloprin or Doloprin with Codeine Momentum Tolectin  Asperbuf Duoprin Mono-gesic Trendar  Aspergum Duradyne Motrin or Motrin IB Triminicin  Aspirin plain, buffered or enteric coated Durasal Myochrisine Trigesic  Aspirin Suppositories Easprin Nalfon Trillsate  Aspirin with Codeine Ecotrin Regular or Extra Strength Naprosyn Uracel  Atromid-S Efficin Naproxen Ursinus  Auranofin Capsules Elmiron Neocylate Vanquish  Axotal Emagrin Norgesic Verin  Azathioprine Empirin or  Empirin with Codeine Normiflo Vitamin E  Azolid Emprazil Nuprin Voltaren  Bayer Aspirin plain, buffered or children's or timed BC Tablets or powders Encaprin Orgaran Warfarin Sodium  Buff-a-Comp Enoxaparin Orudis Zorpin  Buff-a-Comp with Codeine Equegesic Os-Cal-Gesic   Buffaprin Excedrin plain, buffered or Extra Strength Oxalid   Bufferin Arthritis Strength Feldene Oxphenbutazone   Bufferin plain or Extra Strength Feldene Capsules Oxycodone with Aspirin   Bufferin with Codeine Fenoprofen Fenoprofen Pabalate or Pabalate-SF   Buffets II Flogesic Panagesic   Buffinol plain or Extra Strength Florinal or Florinal with Codeine Panwarfarin   Buf-Tabs Flurbiprofen Penicillamine   Butalbital Compound Four-way cold tablets Penicillin   Butazolidin Fragmin Pepto-Bismol   Carbenicillin Geminisyn Percodan   Carna Arthritis Reliever Geopen Persantine   Carprofen Gold's salt Persistin   Chloramphenicol Goody's Phenylbutazone   Chloromycetin Haltrain Piroxlcam   Clmetidine heparin Plaquenil   Cllnoril Hyco-pap Ponstel   Clofibrate Hydroxy chloroquine Propoxyphen         Before stopping any of these medications, be sure to consult the physician who ordered them.  Some, such as Coumadin (Warfarin) are ordered to prevent or treat serious conditions such as "deep thrombosis",  "pumonary embolisms", and other heart problems.  The amount of time that you may need off of the medication may also vary with the medication and the reason for which you were taking it.  If you are taking any of these medications, please make sure you notify your pain physician before you undergo any procedures.

## 2015-09-13 NOTE — Progress Notes (Signed)
Subjective:    Patient ID: [name], female    DOB: 24-Nov-1945, 69 y.o.   MRN: [id number]  HPI  PROCEDURE PERFORMED: Lumbar facet (medial branch block)   NOTE: The MRI is a 69 y.o. female who returns to [place] for further evaluation and treatment of pain involving the lumbar and lower extremity region. MRI  revealed the patient to be with evidence of Denerative disc disease lumbar spine Multilevel degenerative changes lumbar spine, annular disc bulging L4-L5, central disc protrusion L5-S1, L3-4 prominent flattening of the lateral recess and left paracentral disc protrusion. There is concern regarding multilevel degenerative changes of the lumbar spine due to facet arthropathy contributing to significant component of patient's pain began due to facet syndrome. The risks, benefits, and expectations of the procedure have been discussed and explained to the patient who was understanding and in agreement with suggested treatment plan. We will proceed with interventional treatment as discussed and as explained to the patient who was understanding and wished to proceed with procedure as planned.   DESCRIPTION OF PROCEDURE: Lumbar facet (medial branch block) with IV Versed, IV fentanyl conscious sedation, EKG, blood pressure, pulse, and pulse oximetry monitoring. The procedure was performed with the patient in the prone position. Betadine prep of proposed entry site performed.   NEEDLE PLACEMENT AT: Left L 1 lumbar facet (medial branch block). Under fluoroscopic guidance with oblique orientation of 15 degrees, a 22-gauge needle was inserted at the L 1 vertebral body level with needle placed at the targeted area of Burton's Eye or Eye of the Scotty Dog with documentation of needle placement in the superior and lateral border of targeted area of Burton's Eye or Eye of the Scotty Dog with oblique orientation of 15 degrees. Following documentation of needle placement at the L 1 vertebral  body level, needle placement was then accomplished at the L 2 vertebral body level.   NEEDLE PLACEMENT AT L2, L3, L4, and L5 VERTEBRAL BODY LEVELS ON THE LEFT SIDE The procedure was performed at the L2, L3, L4, and L5 vertebral body levels exactly as was performed at the L 1 vertebral body level utilizing the same technique and under fluoroscopic guidance.   Needle placement was then verified at all levels on lateral view. Following documentation of needle placement at all levels on lateral view and following negative aspiration for heme and CSF, each level was injected with 1 mL of 0.25% bupivacaine with Kenalog.     LUMBAR FACET, MEDIAL BRANCH NERVE, BLOCKS PERFORMED ON THE RIGHT SIDE   The procedure was performed on the right side exactly as was performed on the left side at the same levels and utilizing the same technique under fluoroscopic guidance.     The patient tolerated the procedure well. A total of 40 mg of Kenalog was utilized for the procedure.   PLAN:  1. Medications: The patient will continue presently prescribed medications Neurontin and oxycodone  2. May consider modification of treatment regimen at time of return appointment pending response to treatment rendered on today's visit. 3. The patient is to follow-up with primary care physician  Dr. Lisette Grinder  III for further evaluation of blood pressure and general medical condition status post steroid injection performed on today's visit. 4. Surgical follow-up evaluation.Patient without plans for surgical intervention  5. Neurological follow-up evaluation.May consider [facility] EMG studies  6. The patient may be candidate for radiofrequency procedures, implantation type procedures, and other treatment pending response to treatment and follow-up evaluation.  7. The patient has been advised to call the Pain Management Center prior to scheduled return appointment should there be significant change in condition or should patient  have other concerns regarding condition prior to scheduled return appointment.  The patient is understanding and in agreement with suggested treatment plan.       Review of Systems     Objective:   Physical Exam        Assessment & Plan:

## 2015-09-13 NOTE — Progress Notes (Signed)
Safety precautions to be maintained throughout the outpatient stay will include: orient to surroundings, keep bed in low position, maintain call bell within reach at all times, provide assistance with transfer out of bed and ambulation.  

## 2015-09-14 ENCOUNTER — Telehealth: Payer: Self-pay | Admitting: *Deleted

## 2015-09-14 NOTE — Telephone Encounter (Signed)
Patient verbalizes no problems or concerns from procedure on yesterday.

## 2015-09-20 ENCOUNTER — Other Ambulatory Visit: Payer: Self-pay | Admitting: Pain Medicine

## 2015-10-05 ENCOUNTER — Encounter: Payer: Self-pay | Admitting: Pain Medicine

## 2015-10-05 ENCOUNTER — Ambulatory Visit: Payer: PPO | Attending: Pain Medicine | Admitting: Pain Medicine

## 2015-10-05 VITALS — BP 127/76 | HR 70 | Temp 98.1°F | Resp 16 | Ht <= 58 in | Wt 184.0 lb

## 2015-10-05 DIAGNOSIS — M47812 Spondylosis without myelopathy or radiculopathy, cervical region: Secondary | ICD-10-CM | POA: Diagnosis not present

## 2015-10-05 DIAGNOSIS — M545 Low back pain: Secondary | ICD-10-CM | POA: Diagnosis present

## 2015-10-05 DIAGNOSIS — M533 Sacrococcygeal disorders, not elsewhere classified: Secondary | ICD-10-CM | POA: Diagnosis not present

## 2015-10-05 DIAGNOSIS — M172 Bilateral post-traumatic osteoarthritis of knee: Secondary | ICD-10-CM

## 2015-10-05 DIAGNOSIS — J441 Chronic obstructive pulmonary disease with (acute) exacerbation: Secondary | ICD-10-CM

## 2015-10-05 DIAGNOSIS — M5126 Other intervertebral disc displacement, lumbar region: Secondary | ICD-10-CM | POA: Insufficient documentation

## 2015-10-05 DIAGNOSIS — M47816 Spondylosis without myelopathy or radiculopathy, lumbar region: Secondary | ICD-10-CM

## 2015-10-05 DIAGNOSIS — M5136 Other intervertebral disc degeneration, lumbar region: Secondary | ICD-10-CM | POA: Diagnosis not present

## 2015-10-05 DIAGNOSIS — M179 Osteoarthritis of knee, unspecified: Secondary | ICD-10-CM | POA: Insufficient documentation

## 2015-10-05 DIAGNOSIS — M1610 Unilateral primary osteoarthritis, unspecified hip: Secondary | ICD-10-CM | POA: Insufficient documentation

## 2015-10-05 DIAGNOSIS — M7060 Trochanteric bursitis, unspecified hip: Secondary | ICD-10-CM

## 2015-10-05 DIAGNOSIS — M5030 Other cervical disc degeneration, unspecified cervical region: Secondary | ICD-10-CM | POA: Insufficient documentation

## 2015-10-05 DIAGNOSIS — M170 Bilateral primary osteoarthritis of knee: Secondary | ICD-10-CM

## 2015-10-05 DIAGNOSIS — M48062 Spinal stenosis, lumbar region with neurogenic claudication: Secondary | ICD-10-CM

## 2015-10-05 DIAGNOSIS — M542 Cervicalgia: Secondary | ICD-10-CM | POA: Diagnosis present

## 2015-10-05 MED ORDER — OXYCODONE HCL 5 MG PO CAPS: ORAL_CAPSULE | ORAL | Status: DC

## 2015-10-05 NOTE — Progress Notes (Signed)
Safety precautions to be maintained throughout the outpatient stay will include: orient to surroundings, keep bed in low position, maintain call bell within reach at all times, provide assistance with transfer out of bed and ambulation.  

## 2015-10-05 NOTE — Progress Notes (Signed)
Subjective:    Patient ID: [name], female    DOB: 1946/10/11, 69 y.o.   MRN: [id number]  HPI  The patient is a 69 year old female who returns to pain management for further evaluation and treatment of pain involving the region of the neck upper extremity region as well as the lower back and lower extremity region. The patient states that she had remarkable improvement of her pain following previous lumbar facet, medial branch nerve blocks. Patient admits to more than 70% relief of pain following lumbar facet, medial branch nerve blocks. We discussed patient's condition and will also request insurance approval for radiofrequency rhizolysis lumbar facet, medial branch nerves, but is additional modifications of treatment pending response to treatment and follow-up evaluation. The patient was in agreement with suggested treatment plan. We will continue Neurontin and oxycodone as well with the decrease of Neurontin to observe changes in patient's rash. The patient reports rash occurring and this may be due to patient's Neurontin. We will decrease the Neurontin and will observe response to Neurontin and patient will follow-up Dr. Gilford Rile as well regarding the rash and will be considered for dermatological evaluation pending further assessment of patient's condition. Patient also has avoided nonsteroidal anti-inflammatory medication and has a decrease in the discoloration upper extremities which have been due to previous subcutaneous bleeding as a result of the nonsteroidal anti-inflammatory medication. We will continue to observe response to present treatment for interventional treatment in patient's call pain management prior to scheduled return appointment should they be change in condition. The patient agreed to suggested treatment plan         Review of Systems     Objective:   Physical Exam  There was tends to palpation of paraspinal must reason cervical region cervical facet region  palpation which be produced pain of moderate degree. There was moderate tends to palpation over the splenius capitis and occipitalis musculature region. There was tends to palpation of the acromioclavicular and glenohumeral joint region of mild degree. Patient appeared to be with bilaterally equal grip strength and Tinel and Phalen's maneuver were without increased pain of significant degree. Palpation over the thoracic facet thoracic paraspinal musculature he was attends to palpation of mild to moderate degree. Lateral bending and rotation extension and palpation of the lumbar facets reproduce mild to moderate discomfort. There was mild muscle spasms noted in the thoracic musculature region and the lumbar region was with moderate muscle spasms noted. There was tenderness over the PSIS and PII S region as well as the gluteal and piriformis musculatures with mild tinnitus to palpation of the greater trochanteric region and iliotibial band region. Abdomen was nontender with no costovertebral tenderness noted.    Assessment & Plan:     Denerative disc disease lumbar spine Multilevel degenerative changes lumbar spine, annular disc bulging L4-L5, central disc protrusion L5-S1, L3-4 prominent flattening of the lateral recess and left paracentral disc protrusion  Lumbar facet syndrome  Sacroiliac joint dysfunction  Degenerative joint disease of knee  Degenerative joint disease of hip  Degenerative disc disease cervical spine Abdomen was nontender with no costovertebral angle tenderness noted     PLAN     Continue present medication Neurontin and oxycodone decrease Neurontin as discussed to one or 2 per day for 1 week then 1 per day for 1 week. Call to discuss condition while you are decreasing Neurontin  F/U PCP Dr. Lisette Grinder III for evaliation of  BP rash and general medical  condition. Rash may be  due to Neurontin. Also discuss with Dr. Gilford Rile considering dermatological evaluation  F/U  surgical evaluation. May consider pending follow-up evaluations  Ask receptionist to you let you  know when you have  been approved for radiofrequency rhizolysis lumbar facets, medial branch nerves  F/U neurological evaluation. May consider pending follow-up evaluations  May consider radiofrequency rhizolysis or intraspinal procedures pending response to present treatment and F/U evaluation   Patient to call Pain Management Center should patient have concerns prior to scheduled return appointment

## 2015-10-05 NOTE — Patient Instructions (Addendum)
PLAN   Continue present medication Neurontin and oxycodone decrease Neurontin as discussed to one or 2 per day for 1 week then 1 per day for 1 week. Call to discuss condition while you are decreasing Neurontin  F/U PCP Dr. Lisette Grinder III for evaliation of  BP rash and general medical  condition. Rash may be due to Neurontin. Also discuss with Dr. Gilford Rile considering dermatological evaluation  F/U surgical evaluation. May consider pending follow-up evaluations  Ask receptionist to you let you  know when you have  been approved for radiofrequency rhizolysis lumbar facets, medial branch nerves  F/U neurological evaluation. May consider pending follow-up evaluations  May consider radiofrequency rhizolysis or intraspinal procedures pending response to present treatment and F/U evaluation   Patient to call Pain Management Center should patient have concerns prior to scheduled return appointment

## 2015-10-27 ENCOUNTER — Ambulatory Visit: Payer: PPO | Attending: Pain Medicine | Admitting: Pain Medicine

## 2015-10-27 ENCOUNTER — Encounter: Payer: Self-pay | Admitting: Pain Medicine

## 2015-10-27 DIAGNOSIS — M533 Sacrococcygeal disorders, not elsewhere classified: Secondary | ICD-10-CM

## 2015-10-27 DIAGNOSIS — M545 Low back pain: Secondary | ICD-10-CM | POA: Diagnosis not present

## 2015-10-27 DIAGNOSIS — M170 Bilateral primary osteoarthritis of knee: Secondary | ICD-10-CM

## 2015-10-27 DIAGNOSIS — M51369 Other intervertebral disc degeneration, lumbar region without mention of lumbar back pain or lower extremity pain: Secondary | ICD-10-CM

## 2015-10-27 DIAGNOSIS — M48062 Spinal stenosis, lumbar region with neurogenic claudication: Secondary | ICD-10-CM

## 2015-10-27 DIAGNOSIS — M5126 Other intervertebral disc displacement, lumbar region: Secondary | ICD-10-CM | POA: Diagnosis not present

## 2015-10-27 DIAGNOSIS — M5030 Other cervical disc degeneration, unspecified cervical region: Secondary | ICD-10-CM

## 2015-10-27 DIAGNOSIS — M172 Bilateral post-traumatic osteoarthritis of knee: Secondary | ICD-10-CM

## 2015-10-27 DIAGNOSIS — M7060 Trochanteric bursitis, unspecified hip: Secondary | ICD-10-CM

## 2015-10-27 DIAGNOSIS — M5136 Other intervertebral disc degeneration, lumbar region: Secondary | ICD-10-CM | POA: Diagnosis not present

## 2015-10-27 DIAGNOSIS — J441 Chronic obstructive pulmonary disease with (acute) exacerbation: Secondary | ICD-10-CM

## 2015-10-27 DIAGNOSIS — M47816 Spondylosis without myelopathy or radiculopathy, lumbar region: Secondary | ICD-10-CM

## 2015-10-27 MED ORDER — BUPIVACAINE HCL (PF) 0.25 % IJ SOLN
INTRAMUSCULAR | Status: AC
  Filled 2015-10-27: qty 30

## 2015-10-27 MED ORDER — CEFAZOLIN SODIUM 1-5 GM-% IV SOLN: 1.0000 g | Freq: Once | INTRAVENOUS | Status: DC

## 2015-10-27 MED ORDER — FENTANYL CITRATE (PF) 100 MCG/2ML IJ SOLN
INTRAMUSCULAR | Status: AC
  Administered 2015-10-27: 50 ug via INTRAVENOUS
  Filled 2015-10-27: qty 2

## 2015-10-27 MED ORDER — LACTATED RINGERS IV SOLN: 1000.0000 mL | INTRAVENOUS | Status: DC

## 2015-10-27 MED ORDER — MIDAZOLAM HCL 5 MG/5ML IJ SOLN
INTRAMUSCULAR | Status: AC
Start: 2015-10-27 — End: 2015-10-27
  Administered 2015-10-27: 2 mg via INTRAVENOUS
  Filled 2015-10-27: qty 5

## 2015-10-27 MED ORDER — OXYCODONE HCL 5 MG PO CAPS: ORAL_CAPSULE | ORAL | Status: DC

## 2015-10-27 MED ORDER — CEFAZOLIN SODIUM 1 G IJ SOLR
INTRAMUSCULAR | Status: AC
  Administered 2015-10-27: 1 g
  Filled 2015-10-27: qty 10

## 2015-10-27 MED ORDER — LIDOCAINE HCL (PF) 1 % IJ SOLN
10.0000 mL | Freq: Once | INTRAMUSCULAR | Status: AC
  Administered 2015-10-27: 10 mL via SUBCUTANEOUS

## 2015-10-27 MED ORDER — CEFUROXIME AXETIL 250 MG PO TABS: 250.0000 mg | ORAL_TABLET | Freq: Two times a day (BID) | ORAL | Status: DC

## 2015-10-27 MED ORDER — GABAPENTIN 300 MG PO CAPS: ORAL_CAPSULE | ORAL | Status: DC

## 2015-10-27 MED ORDER — FENTANYL CITRATE (PF) 100 MCG/2ML IJ SOLN
100.0000 ug | Freq: Once | INTRAMUSCULAR | Status: AC
  Administered 2015-10-27: 50 ug via INTRAVENOUS

## 2015-10-27 MED ORDER — LIDOCAINE HCL (PF) 1 % IJ SOLN
INTRAMUSCULAR | Status: AC
  Administered 2015-10-27: 10 mL via SUBCUTANEOUS
  Filled 2015-10-27: qty 10

## 2015-10-27 MED ORDER — BUPIVACAINE HCL (PF) 0.25 % IJ SOLN
30.0000 mL | Freq: Once | INTRAMUSCULAR | Status: AC
  Administered 2015-10-27: 10:00:00

## 2015-10-27 MED ORDER — MIDAZOLAM HCL 5 MG/5ML IJ SOLN
5.0000 mg | Freq: Once | INTRAMUSCULAR | Status: AC
  Administered 2015-10-27: 2 mg via INTRAVENOUS

## 2015-10-27 NOTE — Progress Notes (Signed)
Subjective:    Patient ID: [name], female    DOB: [birth year], 69 y.o.   MRN: [id number]  HPI   PROCEDURE PERFORMED: Radiofrequency rhizolysis (lunbar facet medial branch nerve radiofrequency rhizolysis).  The procedure was performed with the COOLIEF technique.  COOLIEF technique was performed using the Synergy cooled radiofrequency probe [id number]-17-150-4, the synergy cooled radiofrequency introducer [id number]-[id number], and the synergy cooled sterile tube kit [id number].   HISTORY: The patient is a 69 y.o. female who returns to the [facility] for further evaluation of severely disabling pain involving the lumbar and lower extremity region.  MRI revealed Denerative disc disease lumbar spine Multilevel degenerative changes lumbar spine, annular disc bulging L4-L5, central disc protrusion L5-S1, L3-4 prominent flattening of the lateral recess and left paracentral disc protrusion   There is concern regarding significant component of patient's pain being due to facet arthropathy with facet syndrome. The patient has had significant relief of pain following previous lumbar facet, medial branch nerve, blocks. Radiofrequency rhizolysis of the lumbar facets, medial branch nerves, will be performed at this time an attempt to provide longer lasting relief of the patient's pain, minimize progression of the patient's symptoms, and avoid the need for more involved treatment. The risks, benefits and expectations of the procedure have been discussed and explained to the patient who was with understanding and wished to proceed with interventional treatment in an attempt to decrease severely  disabling pain of the lumbar and lower extremity region.  We will proceed with what is felt to be a medically necessary procedure, radiofrequency rhizolysis (lumbar facet medial branch nerve radiofrequency rhizolysis) using the COOLIEF technique.  DESCRIPTION OF PROCEDURE: COOLIEF Technique Radiofrequency Rhizolysis of Lumbar  Facets (Medial Branch Nerves Radiofrequency Rhizolysis) with IV Versed, IV Fentanyl conscious sedation, EKG, blood pressure, pulse, and pulse oximetry monitoring.  The procedure was performed with the patient in the prone position under fluoroscopic guidance.  Following Betadine prep of the proposed entry site, a 1.5% lidocaine plain skin wheal of the proposed needle entry site was  prepared in oblique orientation.   Left, L5 Radiofrequency Rhizolysis L5 Facet  (Medial Branch Nerve): Under fluoroscopic guidance, the radiofrequency needle was inserted at the L5 vertebral body level after a local anesthetic skin wheal of 1.5% lidocaine plain and Betadine prep of the proposed needle entry site was performed.  The needle was inserted under fluoroscopic guidance in the area known as Burton's eye or eye of the Scotty dog with needle placed at the superior and lateral border of the targeted area with oblique orientation utilizing the tunnel (gun  barrel approach) technique to the targeted area.  Left Radiofrequency Rhizolysis at L4 and L3  Lumbar Facets (Medial Branch Nerves) The procedure was performed at L4 and L3 exactly as was performed at the L5 and under fluoroscopic guidance utilizing the identical technique as utilized at the L5 vertebral body level.    Needle was then verified on lateral view and following needle placement verification on lateral view, radiofrequency testing was then carried out with motor testing at 2 Hz stimulation without evidence of stimulation of the lower extremity. Following radiofrequency testing for motor testing, each radiofrequency probe was then prepared with 2 mL of preservative-free lidocaine and following 1 minute of anesthetizing each proposed radiofrequency lesioning  Level, the radiofrequency probe was then inserted in the left, L5 vertebral body level needle with radiofrequency lesioning carried out for 2-1/2 minutes with temperature of the tissue being maintained at  80 degrees centigrade for  2-1/2 minutes.  Radiofrequency probe was then inserted in the left, L4 vertebral body level needle and radiofrequency lesioning was performed at 80 degrees centigrade tissue temperature for 2-1/2 minutes.  Radiofrequency probe was then inserted in the left L3 vertebral body level needle and radiofrequency lesioning was performed at 80 degrees centigrade tissue temperature for 2-1/2 minutes  The patient tolerated the procedure well.   PLAN: 1. Medications: The patient will continue the present medications. Neurontin and oxycodone 2. The patient will follow up with Dr. Lisette Grinder III regarding blood pressure and general medical condition as discussed with the patient.  3. Surgical evaluation as discussed. 4. Neurological evaluation as discussed.  5. The patient has been advised to call the Pain Management Center prior to scheduled return appointment should there be significant change in the patient's condition or should the patient have other concerns regarding condition prior to scheduled return appointment.  The patient is with understanding and is in agreement with suggested treatment plan.     Review of Systems     Objective:   Physical Exam        Assessment & Plan:

## 2015-10-27 NOTE — Patient Instructions (Addendum)
PLAN   Continue present medication Neurontin and oxycodone. Please obtain Ceftin antibiotic today and begin taking Ceftin   F/U PCP Dr. Lisette Grinder III for evaliation of  BP and general medical  condition  F/U surgical evaluation. May consider pending follow-up evaluations  Ask receptionist to you let you  know when you have  been approved for radiofrequency rhizolysis lumbar facets, medial branch nerves  F/U neurological evaluation. May consider pending follow-up evaluations  May consider radiofrequency rhizolysis or intraspinal procedures pending response to present treatment and F/U evaluation  Patient to call Pain Management Center should patient have concerns prior to scheduled return appointmentPain Management Discharge Instructions  General Discharge Instructions :  If you need to reach your doctor call: Monday-Friday 8:00 am - 4:00 pm at [phone] or toll free [phone].  After clinic hours [phone] to have operator reach doctor.  Bring all of your medication bottles to all your appointments in the pain clinic.  To cancel or reschedule your appointment with Pain Management please remember to call 24 hours in advance to avoid a fee.  Refer to the educational materials which you have been given on: General Risks, I had my Procedure. Discharge Instructions, Post Sedation.  Post Procedure Instructions:  The drugs you were given will stay in your system until tomorrow, so for the next 24 hours you should not drive, make any legal decisions or drink any alcoholic beverages.  You may eat anything you prefer, but it is better to start with liquids then soups and crackers, and gradually work up to solid foods.  Please notify your doctor immediately if you have any unusual bleeding, trouble breathing or pain that is not related to your normal pain.  Depending on the type of procedure that was done, some parts of your body may feel week and/or numb.  This usually clears up  by tonight or the next day.  Walk with the use of an assistive device or accompanied by an adult for the 24 hours.  You may use ice on the affected area for the first 24 hours.  Put ice in a Ziploc bag and cover with a towel and place against area 15 minutes on 15 minutes off.  You may switch to heat after 24 hours.GENERAL RISKS AND COMPLICATIONS  What are the risk, side effects and possible complications? Generally speaking, most procedures are safe.  However, with any procedure there are risks, side effects, and the possibility of complications.  The risks and complications are dependent upon the sites that are lesioned, or the type of nerve block to be performed.  The closer the procedure is to the spine, the more serious the risks are.  Great care is taken when placing the radio frequency needles, block needles or lesioning probes, but sometimes complications can occur. 1. Infection: Any time there is an injection through the skin, there is a risk of infection.  This is why sterile conditions are used for these blocks.  There are four possible types of infection. 1. Localized skin infection. 2. Central Nervous System Infection-This can be in the form of Meningitis, which can be deadly. 3. Epidural Infections-This can be in the form of an epidural abscess, which can cause pressure inside of the spine, causing compression of the spinal cord with subsequent paralysis. This would require an emergency surgery to decompress, and there are no guarantees that the patient would recover from the paralysis. 4. Discitis-This is an infection of the intervertebral discs.  It occurs in about  1% of discography procedures.  It is difficult to treat and it may lead to surgery.        2. Pain: the needles have to go through skin and soft tissues, will cause soreness.       3. Damage to internal structures:  The nerves to be lesioned may be near blood vessels or    other nerves which can be potentially damaged.        4. Bleeding: Bleeding is more common if the patient is taking blood thinners such as  aspirin, Coumadin, Ticiid, Plavix, etc., or if he/she have some genetic predisposition  such as hemophilia. Bleeding into the spinal canal can cause compression of the spinal  cord with subsequent paralysis.  This would require an emergency surgery to  decompress and there are no guarantees that the patient would recover from the  paralysis.       5. Pneumothorax:  Puncturing of a lung is a possibility, every time a needle is introduced in  the area of the chest or upper back.  Pneumothorax refers to free air around the  collapsed lung(s), inside of the thoracic cavity (chest cavity).  Another two possible  complications related to a similar event would include: Hemothorax and Chylothorax.   These are variations of the Pneumothorax, where instead of air around the collapsed  lung(s), you may have blood or chyle, respectively.       6. Spinal headaches: They may occur with any procedures in the area of the spine.       7. Persistent CSF (Cerebro-Spinal Fluid) leakage: This is a rare problem, but may occur  with prolonged intrathecal or epidural catheters either due to the formation of a fistulous  track or a dural tear.       8. Nerve damage: By working so close to the spinal cord, there is always a possibility of  nerve damage, which could be as serious as a permanent spinal cord injury with  paralysis.       9. Death:  Although rare, severe deadly allergic reactions known as "Anaphylactic  reaction" can occur to any of the medications used.      10. Worsening of the symptoms:  We can always make thing worse.  What are the chances of something like this happening? Chances of any of this occuring are extremely low.  By statistics, you have more of a chance of getting killed in a motor vehicle accident: while driving to the hospital than any of the above occurring .  Nevertheless, you should be aware that they are  possibilities.  In general, it is similar to taking a shower.  Everybody knows that you can slip, hit your head and get killed.  Does that mean that you should not shower again?  Nevertheless always keep in mind that statistics do not mean anything if you happen to be on the wrong side of them.  Even if a procedure has a 1 (one) in a 1,000,000 (million) chance of going wrong, it you happen to be that one..Also, keep in mind that by statistics, you have more of a chance of having something go wrong when taking medications.  Who should not have this procedure? If you are on a blood thinning medication (e.g. Coumadin, Plavix, see list of "Blood Thinners"), or if you have an active infection going on, you should not have the procedure.  If you are taking any blood thinners, please inform your physician.  How should I  prepare for this procedure?  Do not eat or drink anything at least six hours prior to the procedure.  Bring a driver with you .  It cannot be a taxi.  Come accompanied by an adult that can drive you back, and that is strong enough to help you if your legs get weak or numb from the local anesthetic.  Take all of your medicines the morning of the procedure with just enough water to swallow them.  If you have diabetes, make sure that you are scheduled to have your procedure done first thing in the morning, whenever possible.  If you have diabetes, take only half of your insulin dose and notify our nurse that you have done so as soon as you arrive at the clinic.  If you are diabetic, but only take blood sugar pills (oral hypoglycemic), then do not take them on the morning of your procedure.  You may take them after you have had the procedure.  Do not take aspirin or any aspirin-containing medications, at least eleven (11) days prior to the procedure.  They may prolong bleeding.  Wear loose fitting clothing that may be easy to take off and that you would not mind if it got stained with  Betadine or blood.  Do not wear any jewelry or perfume  Remove any nail coloring.  It will interfere with some of our monitoring equipment.  NOTE: Remember that this is not meant to be interpreted as a complete list of all possible complications.  Unforeseen problems may occur.  BLOOD THINNERS The following drugs contain aspirin or other products, which can cause increased bleeding during surgery and should not be taken for 2 weeks prior to and 1 week after surgery.  If you should need take something for relief of minor pain, you may take acetaminophen which is found in Tylenol,m Datril, Anacin-3 and Panadol. It is not blood thinner. The products listed below are.  Do not take any of the products listed below in addition to any listed on your instruction sheet.  A.P.C or A.P.C with Codeine Codeine Phosphate Capsules #3 Ibuprofen Ridaura  ABC compound Congesprin Imuran rimadil  Advil Cope Indocin Robaxisal  Alka-Seltzer Effervescent Pain Reliever and Antacid Coricidin or Coricidin-D  Indomethacin Rufen  Alka-Seltzer plus Cold Medicine Cosprin Ketoprofen S-A-C Tablets  Anacin Analgesic Tablets or Capsules Coumadin Korlgesic Salflex  Anacin Extra Strength Analgesic tablets or capsules CP-2 Tablets Lanoril Salicylate  Anaprox Cuprimine Capsules Levenox Salocol  Anexsia-D Dalteparin Magan Salsalate  Anodynos Darvon compound Magnesium Salicylate Sine-off  Ansaid Dasin Capsules Magsal Sodium Salicylate  Anturane Depen Capsules Marnal Soma  APF Arthritis pain formula Dewitt's Pills Measurin Stanback  Argesic Dia-Gesic Meclofenamic Sulfinpyrazone  Arthritis Bayer Timed Release Aspirin Diclofenac Meclomen Sulindac  Arthritis pain formula Anacin Dicumarol Medipren Supac  Analgesic (Safety coated) Arthralgen Diffunasal Mefanamic Suprofen  Arthritis Strength Bufferin Dihydrocodeine Mepro Compound Suprol  Arthropan liquid Dopirydamole Methcarbomol with Aspirin Synalgos  ASA tablets/Enseals Disalcid  Micrainin Tagament  Ascriptin Doan's Midol Talwin  Ascriptin A/D Dolene Mobidin Tanderil  Ascriptin Extra Strength Dolobid Moblgesic Ticlid  Ascriptin with Codeine Doloprin or Doloprin with Codeine Momentum Tolectin  Asperbuf Duoprin Mono-gesic Trendar  Aspergum Duradyne Motrin or Motrin IB Triminicin  Aspirin plain, buffered or enteric coated Durasal Myochrisine Trigesic  Aspirin Suppositories Easprin Nalfon Trillsate  Aspirin with Codeine Ecotrin Regular or Extra Strength Naprosyn Uracel  Atromid-S Efficin Naproxen Ursinus  Auranofin Capsules Elmiron Neocylate Vanquish  Axotal Emagrin Norgesic Verin  Azathioprine Empirin or Empirin  with Codeine Normiflo Vitamin E  Azolid Emprazil Nuprin Voltaren  Bayer Aspirin plain, buffered or children's or timed BC Tablets or powders Encaprin Orgaran Warfarin Sodium  Buff-a-Comp Enoxaparin Orudis Zorpin  Buff-a-Comp with Codeine Equegesic Os-Cal-Gesic   Buffaprin Excedrin plain, buffered or Extra Strength Oxalid   Bufferin Arthritis Strength Feldene Oxphenbutazone   Bufferin plain or Extra Strength Feldene Capsules Oxycodone with Aspirin   Bufferin with Codeine Fenoprofen Fenoprofen Pabalate or Pabalate-SF   Buffets II Flogesic Panagesic   Buffinol plain or Extra Strength Florinal or Florinal with Codeine Panwarfarin   Buf-Tabs Flurbiprofen Penicillamine   Butalbital Compound Four-way cold tablets Penicillin   Butazolidin Fragmin Pepto-Bismol   Carbenicillin Geminisyn Percodan   Carna Arthritis Reliever Geopen Persantine   Carprofen Gold's salt Persistin   Chloramphenicol Goody's Phenylbutazone   Chloromycetin Haltrain Piroxlcam   Clmetidine heparin Plaquenil   Cllnoril Hyco-pap Ponstel   Clofibrate Hydroxy chloroquine Propoxyphen         Before stopping any of these medications, be sure to consult the physician who ordered them.  Some, such as Coumadin (Warfarin) are ordered to prevent or treat serious conditions such as "deep thrombosis",  "pumonary embolisms", and other heart problems.  The amount of time that you may need off of the medication may also vary with the medication and the reason for which you were taking it.  If you are taking any of these medications, please make sure you notify your pain physician before you undergo any procedures.  Oxycodone script given

## 2015-10-27 NOTE — Progress Notes (Signed)
Safety precautions to be maintained throughout the outpatient stay will include: orient to surroundings, keep bed in low position, maintain call bell within reach at all times, provide assistance with transfer out of bed and ambulation.  

## 2015-10-28 ENCOUNTER — Telehealth: Payer: Self-pay | Admitting: *Deleted

## 2015-10-28 NOTE — Telephone Encounter (Signed)
Left voicemail to call our office if she has any questions or concerns re; procedure on yesterday.

## 2015-11-02 ENCOUNTER — Encounter: Payer: PPO | Admitting: Pain Medicine

## 2015-12-07 ENCOUNTER — Encounter: Payer: Self-pay | Admitting: Pain Medicine

## 2015-12-07 ENCOUNTER — Ambulatory Visit: Payer: Medicare Other | Attending: Pain Medicine | Admitting: Pain Medicine

## 2015-12-07 VITALS — BP 144/89 | HR 94 | Temp 97.8°F | Resp 16 | Ht <= 58 in | Wt 182.0 lb

## 2015-12-07 DIAGNOSIS — M170 Bilateral primary osteoarthritis of knee: Secondary | ICD-10-CM

## 2015-12-07 DIAGNOSIS — M533 Sacrococcygeal disorders, not elsewhere classified: Secondary | ICD-10-CM | POA: Diagnosis not present

## 2015-12-07 DIAGNOSIS — M48062 Spinal stenosis, lumbar region with neurogenic claudication: Secondary | ICD-10-CM

## 2015-12-07 DIAGNOSIS — M545 Low back pain: Secondary | ICD-10-CM | POA: Diagnosis present

## 2015-12-07 DIAGNOSIS — M1710 Unilateral primary osteoarthritis, unspecified knee: Secondary | ICD-10-CM | POA: Diagnosis not present

## 2015-12-07 DIAGNOSIS — M5136 Other intervertebral disc degeneration, lumbar region: Secondary | ICD-10-CM

## 2015-12-07 DIAGNOSIS — M47816 Spondylosis without myelopathy or radiculopathy, lumbar region: Secondary | ICD-10-CM

## 2015-12-07 DIAGNOSIS — M79606 Pain in leg, unspecified: Secondary | ICD-10-CM | POA: Diagnosis present

## 2015-12-07 DIAGNOSIS — M5126 Other intervertebral disc displacement, lumbar region: Secondary | ICD-10-CM | POA: Diagnosis not present

## 2015-12-07 DIAGNOSIS — M172 Bilateral post-traumatic osteoarthritis of knee: Secondary | ICD-10-CM

## 2015-12-07 DIAGNOSIS — M5030 Other cervical disc degeneration, unspecified cervical region: Secondary | ICD-10-CM | POA: Diagnosis not present

## 2015-12-07 DIAGNOSIS — M7060 Trochanteric bursitis, unspecified hip: Secondary | ICD-10-CM

## 2015-12-07 DIAGNOSIS — M1610 Unilateral primary osteoarthritis, unspecified hip: Secondary | ICD-10-CM | POA: Insufficient documentation

## 2015-12-07 MED ORDER — GABAPENTIN 300 MG PO CAPS: ORAL_CAPSULE | ORAL | Status: DC

## 2015-12-07 MED ORDER — OXYCODONE HCL 5 MG PO CAPS: ORAL_CAPSULE | ORAL | Status: DC

## 2015-12-07 NOTE — Progress Notes (Signed)
Safety precautions to be maintained throughout the outpatient stay will include: orient to surroundings, keep bed in low position, maintain call bell within reach at all times, provide assistance with transfer out of bed and ambulation.  

## 2015-12-07 NOTE — Patient Instructions (Addendum)
PLAN   Continue present medication oxycodone and Neurontin  Block of nerves to the sacroiliac joint to be performed at time of return appointment  F/U PCP Dr. Lisette Grinder III for evaliation of  BP and general medical  condition.    F/U surgical evaluation. May consider pending follow-up evaluations  F/U neurological evaluation. May consider pending follow-up evaluations  May consider radiofrequency rhizolysis or intraspinal procedures pending response to present treatment and F/U evaluation   Patient to call Pain Management Center should patient have concerns prior to scheduled return appointmentPain Management Discharge Instructions  General Discharge Instructions :  If you need to reach your doctor call: Monday-Friday 8:00 am - 4:00 pm at [phone] or toll free [phone].  After clinic hours [phone] to have operator reach doctor.  Bring all of your medication bottles to all your appointments in the pain clinic.  To cancel or reschedule your appointment with Pain Management please remember to call 24 hours in advance to avoid a fee.  Refer to the educational materials which you have been given on: General Risks, I had my Procedure. Discharge Instructions, Post Sedation.  Post Procedure Instructions:  The drugs you were given will stay in your system until tomorrow, so for the next 24 hours you should not drive, make any legal decisions or drink any alcoholic beverages.  You may eat anything you prefer, but it is better to start with liquids then soups and crackers, and gradually work up to solid foods.  Please notify your doctor immediately if you have any unusual bleeding, trouble breathing or pain that is not related to your normal pain.  Depending on the type of procedure that was done, some parts of your body may feel week and/or numb.  This usually clears up by tonight or the next day.  Walk with the use of an assistive device or accompanied by an adult for the  24 hours.  You may use ice on the affected area for the first 24 hours.  Put ice in a Ziploc bag and cover with a towel and place against area 15 minutes on 15 minutes off.  You may switch to heat after 24 hours.Sacroiliac (SI) Joint Injection Patient Information  Description: The sacroiliac joint connects the scrum (very low back and tailbone) to the ilium (a pelvic bone which also forms half of the hip joint).  Normally this joint experiences very little motion.  When this joint becomes inflamed or unstable low back and or hip and pelvis pain may result.  Injection of this joint with local anesthetics (numbing medicines) and steroids can provide diagnostic information and reduce pain.  This injection is performed with the aid of x-ray guidance into the tailbone area while you are lying on your stomach.   You may experience an electrical sensation down the leg while this is being done.  You may also experience numbness.  We also may ask if we are reproducing your normal pain during the injection.  Conditions which may be treated SI injection:   Low back, buttock, hip or leg pain  Preparation for the Injection:  1. Do not eat any solid food or dairy products within 6 hours of your appointment.  2. You may drink clear liquids up to 2 hours before appointment.  Clear liquids include water, black coffee, juice or soda.  No milk or cream please. 3. You may take your regular medications, including pain medications with a sip of water before your appointment.  Diabetics should hold  regular insulin (if take separately) and take 1/2 normal NPH dose the morning of the procedure.  Carry some sugar containing items with you to your appointment. 4. A driver must accompany you and be prepared to drive you home after your procedure. 5. Bring all of your current medications with you. 6. An IV may be inserted and sedation may be given at the discretion of the physician. 7. A blood pressure cuff, EKG and other  monitors will often be applied during the procedure.  Some patients may need to have extra oxygen administered for a short period.  8. You will be asked to provide medical information, including your allergies, prior to the procedure.  We must know immediately if you are taking blood thinners (like Coumadin/Warfarin) or if you are allergic to IV iodine contrast (dye).  We must know if you could possible be pregnant.  Possible side effects:   Bleeding from needle site  Infection (rare, may require surgery)  Nerve injury (rare)  Numbness & tingling (temporary)  A brief convulsion or seizure  Light-headedness (temporary)  Pain at injection site (several days)  Decreased blood pressure (temporary)  Weakness in the leg (temporary)   Call if you experience:   New onset weakness or numbness of an extremity below the injection site that last more than 8 hours.  Hives or difficulty breathing ( go to the emergency room)  Inflammation or drainage at the injection site  Any new symptoms which are concerning to you  Please note:  Although the local anesthetic injected can often make your back/ hip/ buttock/ leg feel good for several hours after the injections, the pain will likely return.  It takes 3-7 days for steroids to work in the sacroiliac area.  You may not notice any pain relief for at least that one week.  If effective, we will often do a series of three injections spaced 3-6 weeks apart to maximally decrease your pain.  After the initial series, we generally will wait some months before a repeat injection of the same type.  If you have any questions, please call [phone] [place]  What are the risk, side effects and possible complications? Generally speaking, most procedures are safe.  However, with any procedure there are risks, side effects, and the possibility of complications.  The risks and  complications are dependent upon the sites that are lesioned, or the type of nerve block to be performed.  The closer the procedure is to the spine, the more serious the risks are.  Great care is taken when placing the radio frequency needles, block needles or lesioning probes, but sometimes complications can occur. 1. Infection: Any time there is an injection through the skin, there is a risk of infection.  This is why sterile conditions are used for these blocks.  There are four possible types of infection. 1. Localized skin infection. 2. Central Nervous System Infection-This can be in the form of Meningitis, which can be deadly. 3. Epidural Infections-This can be in the form of an epidural abscess, which can cause pressure inside of the spine, causing compression of the spinal cord with subsequent paralysis. This would require an emergency surgery to decompress, and there are no guarantees that the patient would recover from the paralysis. 4. Discitis-This is an infection of the intervertebral discs.  It occurs in about 1% of discography procedures.  It is difficult to treat and it may lead to surgery.  2. Pain: the needles have to go through skin and soft tissues, will cause soreness.       3. Damage to internal structures:  The nerves to be lesioned may be near blood vessels or    other nerves which can be potentially damaged.       4. Bleeding: Bleeding is more common if the patient is taking blood thinners such as  aspirin, Coumadin, Ticiid, Plavix, etc., or if he/she have some genetic predisposition  such as hemophilia. Bleeding into the spinal canal can cause compression of the spinal  cord with subsequent paralysis.  This would require an emergency surgery to  decompress and there are no guarantees that the patient would recover from the  paralysis.       5. Pneumothorax:  Puncturing of a lung is a possibility, every time a needle is introduced in  the area of the chest or upper back.   Pneumothorax refers to free air around the  collapsed lung(s), inside of the thoracic cavity (chest cavity).  Another two possible  complications related to a similar event would include: Hemothorax and Chylothorax.   These are variations of the Pneumothorax, where instead of air around the collapsed  lung(s), you may have blood or chyle, respectively.       6. Spinal headaches: They may occur with any procedures in the area of the spine.       7. Persistent CSF (Cerebro-Spinal Fluid) leakage: This is a rare problem, but may occur  with prolonged intrathecal or epidural catheters either due to the formation of a fistulous  track or a dural tear.       8. Nerve damage: By working so close to the spinal cord, there is always a possibility of  nerve damage, which could be as serious as a permanent spinal cord injury with  paralysis.       9. Death:  Although rare, severe deadly allergic reactions known as "Anaphylactic  reaction" can occur to any of the medications used.      10. Worsening of the symptoms:  We can always make thing worse.  What are the chances of something like this happening? Chances of any of this occuring are extremely low.  By statistics, you have more of a chance of getting killed in a motor vehicle accident: while driving to the hospital than any of the above occurring .  Nevertheless, you should be aware that they are possibilities.  In general, it is similar to taking a shower.  Everybody knows that you can slip, hit your head and get killed.  Does that mean that you should not shower again?  Nevertheless always keep in mind that statistics do not mean anything if you happen to be on the wrong side of them.  Even if a procedure has a 1 (one) in a 1,000,000 (million) chance of going wrong, it you happen to be that one..Also, keep in mind that by statistics, you have more of a chance of having something go wrong when taking medications.  Who should not have this procedure? If you are  on a blood thinning medication (e.g. Coumadin, Plavix, see list of "Blood Thinners"), or if you have an active infection going on, you should not have the procedure.  If you are taking any blood thinners, please inform your physician.  How should I prepare for this procedure?  Do not eat or drink anything at least six hours prior to the procedure.  Bring a driver  with you .  It cannot be a taxi.  Come accompanied by an adult that can drive you back, and that is strong enough to help you if your legs get weak or numb from the local anesthetic.  Take all of your medicines the morning of the procedure with just enough water to swallow them.  If you have diabetes, make sure that you are scheduled to have your procedure done first thing in the morning, whenever possible.  If you have diabetes, take only half of your insulin dose and notify our nurse that you have done so as soon as you arrive at the clinic.  If you are diabetic, but only take blood sugar pills (oral hypoglycemic), then do not take them on the morning of your procedure.  You may take them after you have had the procedure.  Do not take aspirin or any aspirin-containing medications, at least eleven (11) days prior to the procedure.  They may prolong bleeding.  Wear loose fitting clothing that may be easy to take off and that you would not mind if it got stained with Betadine or blood.  Do not wear any jewelry or perfume  Remove any nail coloring.  It will interfere with some of our monitoring equipment.  NOTE: Remember that this is not meant to be interpreted as a complete list of all possible complications.  Unforeseen problems may occur.  BLOOD THINNERS The following drugs contain aspirin or other products, which can cause increased bleeding during surgery and should not be taken for 2 weeks prior to and 1 week after surgery.  If you should need take something for relief of minor pain, you may take acetaminophen which is found  in Tylenol,m Datril, Anacin-3 and Panadol. It is not blood thinner. The products listed below are.  Do not take any of the products listed below in addition to any listed on your instruction sheet.  A.P.C or A.P.C with Codeine Codeine Phosphate Capsules #3 Ibuprofen Ridaura  ABC compound Congesprin Imuran rimadil  Advil Cope Indocin Robaxisal  Alka-Seltzer Effervescent Pain Reliever and Antacid Coricidin or Coricidin-D  Indomethacin Rufen  Alka-Seltzer plus Cold Medicine Cosprin Ketoprofen S-A-C Tablets  Anacin Analgesic Tablets or Capsules Coumadin Korlgesic Salflex  Anacin Extra Strength Analgesic tablets or capsules CP-2 Tablets Lanoril Salicylate  Anaprox Cuprimine Capsules Levenox Salocol  Anexsia-D Dalteparin Magan Salsalate  Anodynos Darvon compound Magnesium Salicylate Sine-off  Ansaid Dasin Capsules Magsal Sodium Salicylate  Anturane Depen Capsules Marnal Soma  APF Arthritis pain formula Dewitt's Pills Measurin Stanback  Argesic Dia-Gesic Meclofenamic Sulfinpyrazone  Arthritis Bayer Timed Release Aspirin Diclofenac Meclomen Sulindac  Arthritis pain formula Anacin Dicumarol Medipren Supac  Analgesic (Safety coated) Arthralgen Diffunasal Mefanamic Suprofen  Arthritis Strength Bufferin Dihydrocodeine Mepro Compound Suprol  Arthropan liquid Dopirydamole Methcarbomol with Aspirin Synalgos  ASA tablets/Enseals Disalcid Micrainin Tagament  Ascriptin Doan's Midol Talwin  Ascriptin A/D Dolene Mobidin Tanderil  Ascriptin Extra Strength Dolobid Moblgesic Ticlid  Ascriptin with Codeine Doloprin or Doloprin with Codeine Momentum Tolectin  Asperbuf Duoprin Mono-gesic Trendar  Aspergum Duradyne Motrin or Motrin IB Triminicin  Aspirin plain, buffered or enteric coated Durasal Myochrisine Trigesic  Aspirin Suppositories Easprin Nalfon Trillsate  Aspirin with Codeine Ecotrin Regular or Extra Strength Naprosyn Uracel  Atromid-S Efficin Naproxen Ursinus  Auranofin Capsules Elmiron Neocylate  Vanquish  Axotal Emagrin Norgesic Verin  Azathioprine Empirin or Empirin with Codeine Normiflo Vitamin E  Azolid Emprazil Nuprin Voltaren  Bayer Aspirin plain, buffered or children's or timed BC Tablets or powders  Encaprin Orgaran Warfarin Sodium  Buff-a-Comp Enoxaparin Orudis Zorpin  Buff-a-Comp with Codeine Equegesic Os-Cal-Gesic   Buffaprin Excedrin plain, buffered or Extra Strength Oxalid   Bufferin Arthritis Strength Feldene Oxphenbutazone   Bufferin plain or Extra Strength Feldene Capsules Oxycodone with Aspirin   Bufferin with Codeine Fenoprofen Fenoprofen Pabalate or Pabalate-SF   Buffets II Flogesic Panagesic   Buffinol plain or Extra Strength Florinal or Florinal with Codeine Panwarfarin   Buf-Tabs Flurbiprofen Penicillamine   Butalbital Compound Four-way cold tablets Penicillin   Butazolidin Fragmin Pepto-Bismol   Carbenicillin Geminisyn Percodan   Carna Arthritis Reliever Geopen Persantine   Carprofen Gold's salt Persistin   Chloramphenicol Goody's Phenylbutazone   Chloromycetin Haltrain Piroxlcam   Clmetidine heparin Plaquenil   Cllnoril Hyco-pap Ponstel   Clofibrate Hydroxy chloroquine Propoxyphen         Before stopping any of these medications, be sure to consult the physician who ordered them.  Some, such as Coumadin (Warfarin) are ordered to prevent or treat serious conditions such as "deep thrombosis", "pumonary embolisms", and other heart problems.  The amount of time that you may need off of the medication may also vary with the medication and the reason for which you were taking it.  If you are taking any of these medications, please make sure you notify your pain physician before you undergo any procedures.

## 2015-12-07 NOTE — Progress Notes (Signed)
Subjective:    Patient ID: [name], female    DOB: [birth year], 70 y.o.   MRN: [id number]  HPI  The patient is a 70 year old female who returns to pain management for further evaluation and treatment of pain involving the region of the lower back and lower extremity region predominantly the patient has significant improvement of pain following radiofrequency rhizolysis lumbar facet, medial branch nerves. . The patient states that she had exacerbation of her pain when she had to go to Hospital several times and had to drive long distances to Hospital in the file her husband was in the hospital. The patient states that she now has significant pain and muscle spasms occurring in the lower back lower extremity region on the left as well as on the right which is quite intense and interfering with patient ability to walk without experiencing severely disabling pain. The patient admits to significant muscle spasms as well as pain radiating from the lumbar region towards the lower extremity on the left as well as on the right and pain becoming much more intense near the end of the day and interfering with patient ability to obtain restful sleep. We will proceed with interventional treatment at time return appointment consisting of block of nerves to the sacroiliac joint in attempt to decrease severity of symptoms, minimize progression of symptoms, and avoid the need for more involved treatment. The patient was in agreement with suggested treatment plan. Patient states that the pain has improved with prior treatment and at the extensive walking to the hospital and driving for long distances has call severe exacerbation of her symptoms at this time. We will proceed with what is felt to be medically necessary procedure block of nerves to the sacroiliac joint in attempt to decrease severity of symptoms, minimize progression of symptoms, and avoid the need for more involved treatment. The patient agreed to  suggested treatment plan. The patient continues Neurontin and oxycodone without undesirable side effects      Review of Systems     Objective:   Physical Exam   There was tenderness over the spinous M and occipitalis musculature region a mild degree with mild tenderness of the cervical facet cervical paraspinal musculature region. Patient appeared to be with decreased grip strength with unremarkable Spurling's maneuver with mild to moderate tenderness of the acromioclavicular and glenohumeral joint region. Tinel and Phalen's maneuver were without increase of pain of significant degree. The patient was with decreased grip strength. Palpation thoracic facet thoracic paraspinal must reason was was attends to palpation with no crepitus of the thoracic region noted. Palpation over the lumbar paraspinal must reason lumbar facet region was associated with severely disabling pain with lateral bending rotation extension and palpation of the lumbar facets reproduced in severely disabling pain on the left as well as on the right there was severe tenderness to palpation of the PSIS and PII S region as well as the gluteal and piriformis musculature regions. There was moderate tenderness of the greater trochanteric region and iliotibial band region. Patrick's maneuver was attempted and reproduced severely disabling pain. EHL strength was decreased. There was no definite sensory deficit or dermatomal dystrophy detected. There was negative clonus negative Homans. Abdomen was nontender with no costovertebral tenderness noted.     Assessment & Plan:      Denerative disc disease lumbar spine Multilevel degenerative changes lumbar spine, annular disc bulging L4-L5, central disc protrusion L5-S1, L3-4 prominent flattening of the lateral recess and left paracentral disc protrusion  Lumbar facet syndrome  Sacroiliac joint dysfunction  Degenerative joint disease of knee  Degenerative joint disease of  hip  Degenerative disc disease cervical spine       PLAN   Continue present medication oxycodone and Neurontin  Block of nerves to the sacroiliac joint to be performed at time of return appointment  F/U PCP Dr. Lisette Grinder III for evaliation of  BP and general medical  condition.    F/U surgical evaluation. May consider pending follow-up evaluations  F/U neurological evaluation. May consider pending follow-up evaluations  May consider radiofrequency rhizolysis or intraspinal procedures pending response to present treatment and F/U evaluation   Patient to call Pain Management Center should patient have concerns prior to scheduled return appointment

## 2015-12-13 ENCOUNTER — Encounter: Payer: Self-pay | Admitting: Pain Medicine

## 2015-12-13 ENCOUNTER — Ambulatory Visit: Payer: Medicare Other | Attending: Pain Medicine | Admitting: Pain Medicine

## 2015-12-13 VITALS — BP 162/90 | HR 60 | Temp 98.3°F | Resp 16 | Ht <= 58 in | Wt 182.0 lb

## 2015-12-13 DIAGNOSIS — M5030 Other cervical disc degeneration, unspecified cervical region: Secondary | ICD-10-CM

## 2015-12-13 DIAGNOSIS — M47816 Spondylosis without myelopathy or radiculopathy, lumbar region: Secondary | ICD-10-CM

## 2015-12-13 DIAGNOSIS — M48062 Spinal stenosis, lumbar region with neurogenic claudication: Secondary | ICD-10-CM

## 2015-12-13 DIAGNOSIS — M5126 Other intervertebral disc displacement, lumbar region: Secondary | ICD-10-CM | POA: Insufficient documentation

## 2015-12-13 DIAGNOSIS — M533 Sacrococcygeal disorders, not elsewhere classified: Secondary | ICD-10-CM

## 2015-12-13 DIAGNOSIS — M51369 Other intervertebral disc degeneration, lumbar region without mention of lumbar back pain or lower extremity pain: Secondary | ICD-10-CM

## 2015-12-13 DIAGNOSIS — M545 Low back pain: Secondary | ICD-10-CM | POA: Diagnosis present

## 2015-12-13 DIAGNOSIS — M172 Bilateral post-traumatic osteoarthritis of knee: Secondary | ICD-10-CM

## 2015-12-13 DIAGNOSIS — M5136 Other intervertebral disc degeneration, lumbar region: Secondary | ICD-10-CM

## 2015-12-13 DIAGNOSIS — M170 Bilateral primary osteoarthritis of knee: Secondary | ICD-10-CM

## 2015-12-13 DIAGNOSIS — M79606 Pain in leg, unspecified: Secondary | ICD-10-CM | POA: Diagnosis present

## 2015-12-13 DIAGNOSIS — M7060 Trochanteric bursitis, unspecified hip: Secondary | ICD-10-CM

## 2015-12-13 MED ORDER — ORPHENADRINE CITRATE 30 MG/ML IJ SOLN: 60.0000 mg | Freq: Once | INTRAMUSCULAR | Status: DC

## 2015-12-13 MED ORDER — FENTANYL CITRATE (PF) 100 MCG/2ML IJ SOLN: 100.0000 ug | Freq: Once | INTRAMUSCULAR | Status: DC

## 2015-12-13 MED ORDER — TRIAMCINOLONE ACETONIDE 40 MG/ML IJ SUSP
INTRAMUSCULAR | Status: AC
  Filled 2015-12-13: qty 1

## 2015-12-13 MED ORDER — CEFUROXIME AXETIL 250 MG PO TABS: 250.0000 mg | ORAL_TABLET | Freq: Two times a day (BID) | ORAL | Status: DC

## 2015-12-13 MED ORDER — LACTATED RINGERS IV SOLN: 1000.0000 mL | INTRAVENOUS | Status: DC

## 2015-12-13 MED ORDER — CEFAZOLIN SODIUM 1-5 GM-% IV SOLN: 1.0000 g | Freq: Once | INTRAVENOUS | Status: DC

## 2015-12-13 MED ORDER — MIDAZOLAM HCL 5 MG/5ML IJ SOLN
5.0000 mg | Freq: Once | INTRAMUSCULAR | Status: AC
  Administered 2015-12-13: 2 mg via INTRAVENOUS

## 2015-12-13 MED ORDER — BUPIVACAINE HCL (PF) 0.25 % IJ SOLN
INTRAMUSCULAR | Status: AC
  Administered 2015-12-13: 13:00:00
  Filled 2015-12-13: qty 30

## 2015-12-13 MED ORDER — CEFAZOLIN SODIUM 1 G IJ SOLR
INTRAMUSCULAR | Status: AC
  Administered 2015-12-13: 1 g via INTRAVENOUS
  Filled 2015-12-13: qty 10

## 2015-12-13 MED ORDER — MIDAZOLAM HCL 5 MG/5ML IJ SOLN
INTRAMUSCULAR | Status: AC
  Filled 2015-12-13: qty 5

## 2015-12-13 MED ORDER — TRIAMCINOLONE ACETONIDE 40 MG/ML IJ SUSP: 40.0000 mg | Freq: Once | INTRAMUSCULAR | Status: DC

## 2015-12-13 MED ORDER — ORPHENADRINE CITRATE 30 MG/ML IJ SOLN
INTRAMUSCULAR | Status: AC
  Filled 2015-12-13: qty 2

## 2015-12-13 MED ORDER — BUPIVACAINE HCL (PF) 0.25 % IJ SOLN
30.0000 mL | Freq: Once | INTRAMUSCULAR | Status: DC
Start: 2015-12-13 — End: 2016-11-08

## 2015-12-13 MED ORDER — FENTANYL CITRATE (PF) 100 MCG/2ML IJ SOLN
INTRAMUSCULAR | Status: AC
  Administered 2015-12-13: 50 ug via INTRAVENOUS
  Filled 2015-12-13: qty 2

## 2015-12-13 NOTE — Progress Notes (Signed)
Subjective:    Patient ID: [name], female    DOB: Mar 26, 1946, 70 y.o.   MRN: [id number]  HPI  PROCEDURE:  Block of nerves to the sacroiliac joint.   NOTE:  The patient is a 70 y.o. female who returns to the [facility] for further evaluation and treatment of pain involving the lower back and lower extremity region with pain in the region of the buttocks as well. Prior MRI studies reveal denerative disc disease lumbar spine Multilevel degenerative changes lumbar spine, annular disc bulging L4-L5, central disc protrusion L5-S1, L3-4 prominent flattening of the lateral recess and left paracentral disc protrusion.  The patient is with reproduction of severe pain with palpation over the PSIS and PII S regions and is with positive Patrick's maneuver There is concern regarding a significant component of the patient's pain being due to sacroiliac joint dysfunction The risks, benefits, expectations of the procedure have been discussed and explained to the patient who is understanding and willing to proceed with interventional treatment in attempt to decrease severity of patient's symptoms, minimize the risk of medication escalation and  hopefully retard the progression of the patient's symptoms. We will proceed with what is felt to be a medically necessary procedure, block of nerves to the sacroiliac joint.   DESCRIPTION OF PROCEDURE:  Block of nerves to the sacroiliac joint.   The patient was taken to the fluoroscopy suite. With the patient in the prone position with EKG, blood pressure, pulse and pulse oximetry monitoring, IV Versed, IV fentanyl conscious sedation, Betadine prep of proposed entry site was performed.   Block of nerves at the L5 vertebral body level.   With the patient in prone position, under fluoroscopic guidance, a 22 -gauge needle was inserted at the L5 vertebral body level on the left side. With 15 degrees oblique orientation a 22 -gauge needle was inserted in the  region known as Burton's eye or eye of the Scotty dog. Following documentation of needle placement in the area of Burton's eye or eye of the Scotty dog under fluoroscopic guidance, needle placement was then accomplished at the sacral ala level on the left side.   Needle placement at the sacral ala.   With the patient in prone position under fluoroscopic guidance with AP view of the lumbosacral spine, a 22 -gauge needle was inserted in the region known as the sacral ala on the left side. Following documentation of needle placement on the left side under fluoroscopic guidance needle placement was then accomplished at the S1 foramen level.   Needle placement at the S1 foramen level.   With the patient in prone position under fluoroscopic guidance with AP view of the lumbosacral spine and cephalad orientation, a 22 -gauge needle was inserted at the superior and lateral border of the S1 foramen on the left side. Following documentation of needle placement at the S1 foramen level on the left side, needle placement was then accomplished at the S2 foramen level on the left side.   Needle placement at the S2 foramen level.   With the patient in prone position with AP view of the lumbosacral spine with cephalad orientation, a 22 - gauge needle was inserted at the superior and lateral border of the S2 foramen under fluoroscopic guidance on the left side. Following needle placement at the L5 vertebral body level, sacral ala, S1 foramen and S2 foramen on the left  side, needle placement was verified on lateral view under fluoroscopic guidance.  Following needle  placement documentation on lateral view, each needle was injected with 1 mL of 0.25% bupivacaine and Kenalog.   BLOCK OF THE NERVES TO SACROILIAC JOINT ON THE RIGHT SIDE The procedure was performed on the right side at the same levels as was performed on the left side and utilizing the same technique as on the left side and was performed under fluoroscopic  guidance as on the left side    Myoneural block injections of the gluteal musculature region Following Betadine prep of proposed entry site a 22-gauge needle was inserted in the gluteal musculature region and following negative aspiration 2 cc of 0.25% bupivacaine with Norflex was injected for myoneural block injection of the gluteal musculature region times two.  The patient tolerated procedure well   A total of '10mg'$  of Kenalog was utilized for the procedure.   PLAN:  1. Medications: The patient will continue presently prescribed medications Neurontin and oxycodone  2. The patient will be considered for modification of treatment regimen pending response to the procedure performed on today's visit.  3. The patient is to follow-up with primary care physician Dr. Lisette Grinder III for evaluation of blood pressure and general medical condition following the procedure performed on today's visit.  4. Surgical evaluation as discussed. Has been addressed 5. Neurological evaluation as discussed. Has been addressed 6. The patient may be a candidate for radiofrequency procedures, implantation devices and other treatment pending response to treatment performed on today's visit and follow-up evaluation.  7. The patient has been advised to adhere to proper body mechanics and to avoid activities which may exacerbate the patient's symptoms.   Return appointment to [facility] as scheduled.    Review of Systems     Objective:   Physical Exam        Assessment & Plan:

## 2015-12-13 NOTE — Patient Instructions (Addendum)
PLAN   Continue present medication Neurontin and oxycodone. Please obtain Ceftin antibiotic today and begin taking Ceftin   F/U PCP Dr. Lisette Grinder III for evaliation of  BP and general medical  condition  F/U surgical evaluation. May consider pending follow-up evaluations  F/U neurological evaluation. May consider pending follow-up evaluations  May consider radiofrequency rhizolysis or intraspinal procedures pending response to present treatment and F/U evaluation  Patient to call [facility] should patient have concerns prior to scheduled return appointmentPain Management Discharge Instructions  General Discharge Instructions :  If you need to reach your doctor call: Monday-Friday 8:00 am - 4:00 pm at [phone] or toll free [phone].  After clinic hours [phone] to have operator reach doctor.  Bring all of your medication bottles to all your appointments in the pain clinic.  To cancel or reschedule your appointment with Pain Management please remember to call 24 hours in advance to avoid a fee.  Refer to the educational materials which you have been given on: General Risks, I had my Procedure. Discharge Instructions, Post Sedation.  Post Procedure Instructions:  The drugs you were given will stay in your system until tomorrow, so for the next 24 hours you should not drive, make any legal decisions or drink any alcoholic beverages.  You may eat anything you prefer, but it is better to start with liquids then soups and crackers, and gradually work up to solid foods.  Please notify your doctor immediately if you have any unusual bleeding, trouble breathing or pain that is not related to your normal pain.  Depending on the type of procedure that was done, some parts of your body may feel week and/or numb.  This usually clears up by tonight or the next day.  Walk with the use of an assistive device or accompanied by an adult for the 24 hours.  You may use ice  on the affected area for the first 24 hours.  Put ice in a Ziploc bag and cover with a towel and place against area 15 minutes on 15 minutes off.  You may switch to heat after 24 hours.GENERAL RISKS AND COMPLICATIONS  What are the risk, side effects and possible complications? Generally speaking, most procedures are safe.  However, with any procedure there are risks, side effects, and the possibility of complications.  The risks and complications are dependent upon the sites that are lesioned, or the type of nerve block to be performed.  The closer the procedure is to the spine, the more serious the risks are.  Great care is taken when placing the radio frequency needles, block needles or lesioning probes, but sometimes complications can occur. 1. Infection: Any time there is an injection through the skin, there is a risk of infection.  This is why sterile conditions are used for these blocks.  There are four possible types of infection. 1. Localized skin infection. 2. Central Nervous System Infection-This can be in the form of Meningitis, which can be deadly. 3. Epidural Infections-This can be in the form of an epidural abscess, which can cause pressure inside of the spine, causing compression of the spinal cord with subsequent paralysis. This would require an emergency surgery to decompress, and there are no guarantees that the patient would recover from the paralysis. 4. Discitis-This is an infection of the intervertebral discs.  It occurs in about 1% of discography procedures.  It is difficult to treat and it may lead to surgery.  2. Pain: the needles have to go through skin and soft tissues, will cause soreness.       3. Damage to internal structures:  The nerves to be lesioned may be near blood vessels or    other nerves which can be potentially damaged.       4. Bleeding: Bleeding is more common if the patient is taking blood thinners such as  aspirin, Coumadin, Ticiid, Plavix, etc., or if  he/she have some genetic predisposition  such as hemophilia. Bleeding into the spinal canal can cause compression of the spinal  cord with subsequent paralysis.  This would require an emergency surgery to  decompress and there are no guarantees that the patient would recover from the  paralysis.       5. Pneumothorax:  Puncturing of a lung is a possibility, every time a needle is introduced in  the area of the chest or upper back.  Pneumothorax refers to free air around the  collapsed lung(s), inside of the thoracic cavity (chest cavity).  Another two possible  complications related to a similar event would include: Hemothorax and Chylothorax.   These are variations of the Pneumothorax, where instead of air around the collapsed  lung(s), you may have blood or chyle, respectively.       6. Spinal headaches: They may occur with any procedures in the area of the spine.       7. Persistent CSF (Cerebro-Spinal Fluid) leakage: This is a rare problem, but may occur  with prolonged intrathecal or epidural catheters either due to the formation of a fistulous  track or a dural tear.       8. Nerve damage: By working so close to the spinal cord, there is always a possibility of  nerve damage, which could be as serious as a permanent spinal cord injury with  paralysis.       9. Death:  Although rare, severe deadly allergic reactions known as "Anaphylactic  reaction" can occur to any of the medications used.      10. Worsening of the symptoms:  We can always make thing worse.  What are the chances of something like this happening? Chances of any of this occuring are extremely low.  By statistics, you have more of a chance of getting killed in a motor vehicle accident: while driving to the hospital than any of the above occurring .  Nevertheless, you should be aware that they are possibilities.  In general, it is similar to taking a shower.  Everybody knows that you can slip, hit your head and get killed.  Does that mean  that you should not shower again?  Nevertheless always keep in mind that statistics do not mean anything if you happen to be on the wrong side of them.  Even if a procedure has a 1 (one) in a 1,000,000 (million) chance of going wrong, it you happen to be that one..Also, keep in mind that by statistics, you have more of a chance of having something go wrong when taking medications.  Who should not have this procedure? If you are on a blood thinning medication (e.g. Coumadin, Plavix, see list of "Blood Thinners"), or if you have an active infection going on, you should not have the procedure.  If you are taking any blood thinners, please inform your physician.  How should I prepare for this procedure?  Do not eat or drink anything at least six hours prior to the procedure.  Bring a driver  with you .  It cannot be a taxi.  Come accompanied by an adult that can drive you back, and that is strong enough to help you if your legs get weak or numb from the local anesthetic.  Take all of your medicines the morning of the procedure with just enough water to swallow them.  If you have diabetes, make sure that you are scheduled to have your procedure done first thing in the morning, whenever possible.  If you have diabetes, take only half of your insulin dose and notify our nurse that you have done so as soon as you arrive at the clinic.  If you are diabetic, but only take blood sugar pills (oral hypoglycemic), then do not take them on the morning of your procedure.  You may take them after you have had the procedure.  Do not take aspirin or any aspirin-containing medications, at least eleven (11) days prior to the procedure.  They may prolong bleeding.  Wear loose fitting clothing that may be easy to take off and that you would not mind if it got stained with Betadine or blood.  Do not wear any jewelry or perfume  Remove any nail coloring.  It will interfere with some of our monitoring  equipment.  NOTE: Remember that this is not meant to be interpreted as a complete list of all possible complications.  Unforeseen problems may occur.  BLOOD THINNERS The following drugs contain aspirin or other products, which can cause increased bleeding during surgery and should not be taken for 2 weeks prior to and 1 week after surgery.  If you should need take something for relief of minor pain, you may take acetaminophen which is found in Tylenol,m Datril, Anacin-3 and Panadol. It is not blood thinner. The products listed below are.  Do not take any of the products listed below in addition to any listed on your instruction sheet.  A.P.C or A.P.C with Codeine Codeine Phosphate Capsules #3 Ibuprofen Ridaura  ABC compound Congesprin Imuran rimadil  Advil Cope Indocin Robaxisal  Alka-Seltzer Effervescent Pain Reliever and Antacid Coricidin or Coricidin-D  Indomethacin Rufen  Alka-Seltzer plus Cold Medicine Cosprin Ketoprofen S-A-C Tablets  Anacin Analgesic Tablets or Capsules Coumadin Korlgesic Salflex  Anacin Extra Strength Analgesic tablets or capsules CP-2 Tablets Lanoril Salicylate  Anaprox Cuprimine Capsules Levenox Salocol  Anexsia-D Dalteparin Magan Salsalate  Anodynos Darvon compound Magnesium Salicylate Sine-off  Ansaid Dasin Capsules Magsal Sodium Salicylate  Anturane Depen Capsules Marnal Soma  APF Arthritis pain formula Dewitt's Pills Measurin Stanback  Argesic Dia-Gesic Meclofenamic Sulfinpyrazone  Arthritis Bayer Timed Release Aspirin Diclofenac Meclomen Sulindac  Arthritis pain formula Anacin Dicumarol Medipren Supac  Analgesic (Safety coated) Arthralgen Diffunasal Mefanamic Suprofen  Arthritis Strength Bufferin Dihydrocodeine Mepro Compound Suprol  Arthropan liquid Dopirydamole Methcarbomol with Aspirin Synalgos  ASA tablets/Enseals Disalcid Micrainin Tagament  Ascriptin Doan's Midol Talwin  Ascriptin A/D Dolene Mobidin Tanderil  Ascriptin Extra Strength Dolobid  Moblgesic Ticlid  Ascriptin with Codeine Doloprin or Doloprin with Codeine Momentum Tolectin  Asperbuf Duoprin Mono-gesic Trendar  Aspergum Duradyne Motrin or Motrin IB Triminicin  Aspirin plain, buffered or enteric coated Durasal Myochrisine Trigesic  Aspirin Suppositories Easprin Nalfon Trillsate  Aspirin with Codeine Ecotrin Regular or Extra Strength Naprosyn Uracel  Atromid-S Efficin Naproxen Ursinus  Auranofin Capsules Elmiron Neocylate Vanquish  Axotal Emagrin Norgesic Verin  Azathioprine Empirin or Empirin with Codeine Normiflo Vitamin E  Azolid Emprazil Nuprin Voltaren  Bayer Aspirin plain, buffered or children's or timed BC Tablets or powders  Encaprin Orgaran Warfarin Sodium  Buff-a-Comp Enoxaparin Orudis Zorpin  Buff-a-Comp with Codeine Equegesic Os-Cal-Gesic   Buffaprin Excedrin plain, buffered or Extra Strength Oxalid   Bufferin Arthritis Strength Feldene Oxphenbutazone   Bufferin plain or Extra Strength Feldene Capsules Oxycodone with Aspirin   Bufferin with Codeine Fenoprofen Fenoprofen Pabalate or Pabalate-SF   Buffets II Flogesic Panagesic   Buffinol plain or Extra Strength Florinal or Florinal with Codeine Panwarfarin   Buf-Tabs Flurbiprofen Penicillamine   Butalbital Compound Four-way cold tablets Penicillin   Butazolidin Fragmin Pepto-Bismol   Carbenicillin Geminisyn Percodan   Carna Arthritis Reliever Geopen Persantine   Carprofen Gold's salt Persistin   Chloramphenicol Goody's Phenylbutazone   Chloromycetin Haltrain Piroxlcam   Clmetidine heparin Plaquenil   Cllnoril Hyco-pap Ponstel   Clofibrate Hydroxy chloroquine Propoxyphen         Before stopping any of these medications, be sure to consult the physician who ordered them.  Some, such as Coumadin (Warfarin) are ordered to prevent or treat serious conditions such as "deep thrombosis", "pumonary embolisms", and other heart problems.  The amount of time that you may need off of the medication may also vary with  the medication and the reason for which you were taking it.  If you are taking any of these medications, please make sure you notify your pain physician before you undergo any procedures.         Selective Nerve Root Block Patient Information  Description: Specific nerve roots exit the spinal canal and these nerves can be compressed and inflamed by a bulging disc and bone spurs.  By injecting steroids on the nerve root, we can potentially decrease the inflammation surrounding these nerves, which often leads to decreased pain.  Also, by injecting local anesthesia on the nerve root, this can provide [name] helpful information to give to your referring doctor if it decreases your pain.  Selective nerve root blocks can be done along the spine from the neck to the low back depending on the location of your pain.   After numbing the skin with local anesthesia, a small needle is passed to the nerve root and the position of the needle is verified using x-ray pictures.  After the needle is in correct position, we then deposit the medication.  You may experience a pressure sensation while this is being done.  The entire block usually lasts less than 15 minutes.  Conditions that may be treated with selective nerve root blocks:  Low back and leg pain  Spinal stenosis  Diagnostic block prior to potential surgery  Neck and arm pain  Post laminectomy syndrome  Preparation for the injection:  1. Do not eat any solid food or dairy products within 6 hours of your appointment. 2. You may drink clear liquids up to 2 hours before an appointment.  Clear liquids include water, black coffee, juice or soda.  No milk or cream please. 3. You may take your regular medications, including pain medications, with a sip of water before your appointment.  Diabetics should hold regular insulin (if taken separately) and take 1/2 normal NPH dose the morning of the procedure.  Carry some sugar containing items with you to your  appointment. 4. A driver must accompany you and be prepared to drive you home after your procedure. 5. Bring all your current medications with you. 6. An IV may be inserted and sedation may be given at the discretion of the physician. 7. A blood pressure cuff, EKG, and other monitors will often be  applied during the procedure.  Some patients may need to have extra oxygen administered for a short period. 8. You will be asked to provide medical information, including allergies, prior to the procedure.  We must know immediately if you are taking blood  Thinners (like Coumadin) or if you are allergic to IV iodine contrast (dye).  Possible side-effects: All are usually temporary  Bleeding from needle site  Light headedness  Numbness and tingling  Decreased blood pressure  Weakness in arms/legs  Pressure sensation in back/neck  Pain at injection site (several days)  Possible complications: All are extremely rare  Infection  Nerve injury  Spinal headache (a headache wore with upright position)  Call if you experience:  Fever/chills associated with headache or increased back/neck pain  Headache worsened by an upright position  New onset weakness or numbness of an extremity below the injection site  Hives or difficulty breathing (go to the emergency room)  Inflammation or drainage at the injection site(s)  Severe back/neck pain greater than usual  New symptoms which are concerning to you  Please note:  Although the local anesthetic injected can often make your back or neck feel good for several hours after the injection the pain will likely return.  It takes 3-5 days for steroids to work on the nerve root. You may not notice any pain relief for at least one week.  If effective, we will often do a series of 3 injections spaced 3-6 weeks apart to maximally decrease your pain.    If you have any questions, please call [phone] [facility]

## 2015-12-13 NOTE — Progress Notes (Signed)
Safety precautions to be maintained throughout the outpatient stay will include: orient to surroundings, keep bed in low position, maintain call bell within reach at all times, provide assistance with transfer out of bed and ambulation.  

## 2015-12-14 ENCOUNTER — Telehealth: Payer: Self-pay | Admitting: *Deleted

## 2015-12-14 NOTE — Telephone Encounter (Signed)
No problems post procedure. 

## 2015-12-24 ENCOUNTER — Other Ambulatory Visit: Payer: Self-pay | Admitting: Pain Medicine

## 2016-01-03 ENCOUNTER — Ambulatory Visit: Payer: Medicare Other | Attending: Pain Medicine | Admitting: Pain Medicine

## 2016-01-03 ENCOUNTER — Encounter: Payer: Self-pay | Admitting: Pain Medicine

## 2016-01-03 VITALS — BP 131/53 | HR 67 | Temp 98.3°F | Resp 16 | Ht <= 58 in | Wt 182.0 lb

## 2016-01-03 DIAGNOSIS — M5030 Other cervical disc degeneration, unspecified cervical region: Secondary | ICD-10-CM | POA: Insufficient documentation

## 2016-01-03 DIAGNOSIS — M1610 Unilateral primary osteoarthritis, unspecified hip: Secondary | ICD-10-CM | POA: Insufficient documentation

## 2016-01-03 DIAGNOSIS — M5136 Other intervertebral disc degeneration, lumbar region: Secondary | ICD-10-CM | POA: Diagnosis not present

## 2016-01-03 DIAGNOSIS — M533 Sacrococcygeal disorders, not elsewhere classified: Secondary | ICD-10-CM | POA: Diagnosis not present

## 2016-01-03 DIAGNOSIS — M1710 Unilateral primary osteoarthritis, unspecified knee: Secondary | ICD-10-CM | POA: Diagnosis not present

## 2016-01-03 DIAGNOSIS — M47816 Spondylosis without myelopathy or radiculopathy, lumbar region: Secondary | ICD-10-CM | POA: Diagnosis not present

## 2016-01-03 DIAGNOSIS — M546 Pain in thoracic spine: Secondary | ICD-10-CM | POA: Diagnosis present

## 2016-01-03 DIAGNOSIS — M170 Bilateral primary osteoarthritis of knee: Secondary | ICD-10-CM

## 2016-01-03 DIAGNOSIS — M5126 Other intervertebral disc displacement, lumbar region: Secondary | ICD-10-CM | POA: Diagnosis not present

## 2016-01-03 DIAGNOSIS — M79606 Pain in leg, unspecified: Secondary | ICD-10-CM | POA: Diagnosis present

## 2016-01-03 DIAGNOSIS — M542 Cervicalgia: Secondary | ICD-10-CM | POA: Diagnosis present

## 2016-01-03 DIAGNOSIS — M7060 Trochanteric bursitis, unspecified hip: Secondary | ICD-10-CM

## 2016-01-03 DIAGNOSIS — M48062 Spinal stenosis, lumbar region with neurogenic claudication: Secondary | ICD-10-CM

## 2016-01-03 DIAGNOSIS — M172 Bilateral post-traumatic osteoarthritis of knee: Secondary | ICD-10-CM

## 2016-01-03 MED ORDER — OXYCODONE HCL 5 MG PO CAPS: ORAL_CAPSULE | ORAL | Status: DC

## 2016-01-03 MED ORDER — GABAPENTIN 300 MG PO CAPS: ORAL_CAPSULE | ORAL | Status: DC

## 2016-01-03 NOTE — Patient Instructions (Addendum)
PLAN   Continue present medication oxycodone and Neurontin  Block of nerves to the sacroiliac joint to be performed at time of return appointment  F/U PCP Dr. Lisette Grinder III for evaliation of  BP and general medical  condition.    F/U surgical evaluation. May consider pending follow-up evaluations  F/U neurological evaluation. May consider pending follow-up evaluations  May consider radiofrequency rhizolysis or intraspinal procedures pending response to present treatment and F/U evaluation   Patient to call [facility] Management Center should patient have concerns prior to scheduled return appointmentSelective Nerve Root Block Patient Information  Description: Specific nerve roots exit the spinal canal and these nerves can be compressed and inflamed by a bulging disc and bone spurs.  By injecting steroids on the nerve root, we can potentially decrease the inflammation surrounding these nerves, which often leads to decreased pain.  Also, by injecting local anesthesia on the nerve root, this can provide [name] helpful information to give to your referring doctor if it decreases your pain.  Selective nerve root blocks can be done along the spine from the neck to the low back depending on the location of your pain.   After numbing the skin with local anesthesia, a small needle is passed to the nerve root and the position of the needle is verified using x-ray pictures.  After the needle is in correct position, we then deposit the medication.  You may experience a pressure sensation while this is being done.  The entire block usually lasts less than 15 minutes.  Conditions that may be treated with selective nerve root blocks:  Low back and leg pain  Spinal stenosis  Diagnostic block prior to potential surgery  Neck and arm pain  Post laminectomy syndrome  Preparation for the injection:  1. Do not eat any solid food or dairy products within 8 hours of your appointment. 2. You may drink clear  liquids up to 3 hours before an appointment.  Clear liquids include water, black coffee, juice or soda.  No milk or cream please. 3. You may take your regular medications, including pain medications, with a sip of water before your appointment.  Diabetics should hold regular insulin (if taken separately) and take 1/2 normal NPH dose the morning of the procedure.  Carry some sugar containing items with you to your appointment. 4. A driver must accompany you and be prepared to drive you home after your procedure. 5. Bring all your current medications with you. 6. An IV may be inserted and sedation may be given at the discretion of the physician. 7. A blood pressure cuff, EKG, and other monitors will often be applied during the procedure.  Some patients may need to have extra oxygen administered for a short period. 8. You will be asked to provide medical information, including allergies, prior to the procedure.  We must know immediately if you are taking blood  Thinners (like Coumadin) or if you are allergic to IV iodine contrast (dye).  Possible side-effects: All are usually temporary  Bleeding from needle site  Light headedness  Numbness and tingling  Decreased blood pressure  Weakness in arms/legs  Pressure sensation in back/neck  Pain at injection site (several days)  Possible complications: All are extremely rare  Infection  Nerve injury  Spinal headache (a headache wore with upright position)  Call if you experience:  Fever/chills associated with headache or increased back/neck pain  Headache worsened by an upright position  New onset weakness or numbness of an extremity  below the injection site  Hives or difficulty breathing (go to the emergency room)  Inflammation or drainage at the injection site(s)  Severe back/neck pain greater than usual  New symptoms which are concerning to you  Please note:  Although the local anesthetic injected can often make your  back or neck feel good for several hours after the injection the pain will likely return.  It takes 3-5 days for steroids to work on the nerve root. You may not notice any pain relief for at least one week.  If effective, we will often do a series of 3 injections spaced 3-6 weeks apart to maximally decrease your pain.    If you have any questions, please call [phone] [facility] ClinicGENERAL RISKS AND COMPLICATIONS  What are the risk, side effects and possible complications? Generally speaking, most procedures are safe.  However, with any procedure there are risks, side effects, and the possibility of complications.  The risks and complications are dependent upon the sites that are lesioned, or the type of nerve block to be performed.  The closer the procedure is to the spine, the more serious the risks are.  Great care is taken when placing the radio frequency needles, block needles or lesioning probes, but sometimes complications can occur. 1. Infection: Any time there is an injection through the skin, there is a risk of infection.  This is why sterile conditions are used for these blocks.  There are four possible types of infection. 1. Localized skin infection. 2. Central Nervous System Infection-This can be in the form of Meningitis, which can be deadly. 3. Epidural Infections-This can be in the form of an epidural abscess, which can cause pressure inside of the spine, causing compression of the spinal cord with subsequent paralysis. This would require an emergency surgery to decompress, and there are no guarantees that the patient would recover from the paralysis. 4. Discitis-This is an infection of the intervertebral discs.  It occurs in about 1% of discography procedures.  It is difficult to treat and it may lead to surgery.        2. Pain: the needles have to go through skin and soft tissues, will cause soreness.       3. Damage to internal structures:  The  nerves to be lesioned may be near blood vessels or    other nerves which can be potentially damaged.       4. Bleeding: Bleeding is more common if the patient is taking blood thinners such as  aspirin, Coumadin, Ticiid, Plavix, etc., or if he/she have some genetic predisposition  such as hemophilia. Bleeding into the spinal canal can cause compression of the spinal  cord with subsequent paralysis.  This would require an emergency surgery to  decompress and there are no guarantees that the patient would recover from the  paralysis.       5. Pneumothorax:  Puncturing of a lung is a possibility, every time a needle is introduced in  the area of the chest or upper back.  Pneumothorax refers to free air around the  collapsed lung(s), inside of the thoracic cavity (chest cavity).  Another two possible  complications related to a similar event would include: Hemothorax and Chylothorax.   These are variations of the Pneumothorax, where instead of air around the collapsed  lung(s), you may have blood or chyle, respectively.       6. Spinal headaches: They may occur with any procedures in the area  of the spine.       7. Persistent CSF (Cerebro-Spinal Fluid) leakage: This is a rare problem, but may occur  with prolonged intrathecal or epidural catheters either due to the formation of a fistulous  track or a dural tear.       8. Nerve damage: By working so close to the spinal cord, there is always a possibility of  nerve damage, which could be as serious as a permanent spinal cord injury with  paralysis.       9. Death:  Although rare, severe deadly allergic reactions known as "Anaphylactic  reaction" can occur to any of the medications used.      10. Worsening of the symptoms:  We can always make thing worse.  What are the chances of something like this happening? Chances of any of this occuring are extremely low.  By statistics, you have more of a chance of getting killed in a motor vehicle accident: while driving  to the hospital than any of the above occurring .  Nevertheless, you should be aware that they are possibilities.  In general, it is similar to taking a shower.  Everybody knows that you can slip, hit your head and get killed.  Does that mean that you should not shower again?  Nevertheless always keep in mind that statistics do not mean anything if you happen to be on the wrong side of them.  Even if a procedure has a 1 (one) in a 1,000,000 (million) chance of going wrong, it you happen to be that one..Also, keep in mind that by statistics, you have more of a chance of having something go wrong when taking medications.  Who should not have this procedure? If you are on a blood thinning medication (e.g. Coumadin, Plavix, see list of "Blood Thinners"), or if you have an active infection going on, you should not have the procedure.  If you are taking any blood thinners, please inform your physician.  How should I prepare for this procedure?  Do not eat or drink anything at least six hours prior to the procedure.  Bring a driver with you .  It cannot be a taxi.  Come accompanied by an adult that can drive you back, and that is strong enough to help you if your legs get weak or numb from the local anesthetic.  Take all of your medicines the morning of the procedure with just enough water to swallow them.  If you have diabetes, make sure that you are scheduled to have your procedure done first thing in the morning, whenever possible.  If you have diabetes, take only half of your insulin dose and notify our nurse that you have done so as soon as you arrive at the clinic.  If you are diabetic, but only take blood sugar pills (oral hypoglycemic), then do not take them on the morning of your procedure.  You may take them after you have had the procedure.  Do not take aspirin or any aspirin-containing medications, at least eleven (11) days prior to the procedure.  They may prolong bleeding.  Wear loose  fitting clothing that may be easy to take off and that you would not mind if it got stained with Betadine or blood.  Do not wear any jewelry or perfume  Remove any nail coloring.  It will interfere with some of our monitoring equipment.  NOTE: Remember that this is not meant to be interpreted as a complete list of all possible  complications.  Unforeseen problems may occur.  BLOOD THINNERS The following drugs contain aspirin or other products, which can cause increased bleeding during surgery and should not be taken for 2 weeks prior to and 1 week after surgery.  If you should need take something for relief of minor pain, you may take acetaminophen which is found in Tylenol,m Datril, Anacin-3 and Panadol. It is not blood thinner. The products listed below are.  Do not take any of the products listed below in addition to any listed on your instruction sheet.  A.P.C or A.P.C with Codeine Codeine Phosphate Capsules #3 Ibuprofen Ridaura  ABC compound Congesprin Imuran rimadil  Advil Cope Indocin Robaxisal  Alka-Seltzer Effervescent Pain Reliever and Antacid Coricidin or Coricidin-D  Indomethacin Rufen  Alka-Seltzer plus Cold Medicine Cosprin Ketoprofen S-A-C Tablets  Anacin Analgesic Tablets or Capsules Coumadin Korlgesic Salflex  Anacin Extra Strength Analgesic tablets or capsules CP-2 Tablets Lanoril Salicylate  Anaprox Cuprimine Capsules Levenox Salocol  Anexsia-D Dalteparin Magan Salsalate  Anodynos Darvon compound Magnesium Salicylate Sine-off  Ansaid Dasin Capsules Magsal Sodium Salicylate  Anturane Depen Capsules Marnal Soma  APF Arthritis pain formula Dewitt's Pills Measurin Stanback  Argesic Dia-Gesic Meclofenamic Sulfinpyrazone  Arthritis Bayer Timed Release Aspirin Diclofenac Meclomen Sulindac  Arthritis pain formula Anacin Dicumarol Medipren Supac  Analgesic (Safety coated) Arthralgen Diffunasal Mefanamic Suprofen  Arthritis Strength Bufferin Dihydrocodeine Mepro Compound Suprol   Arthropan liquid Dopirydamole Methcarbomol with Aspirin Synalgos  ASA tablets/Enseals Disalcid Micrainin Tagament  Ascriptin Doan's Midol Talwin  Ascriptin A/D Dolene Mobidin Tanderil  Ascriptin Extra Strength Dolobid Moblgesic Ticlid  Ascriptin with Codeine Doloprin or Doloprin with Codeine Momentum Tolectin  Asperbuf Duoprin Mono-gesic Trendar  Aspergum Duradyne Motrin or Motrin IB Triminicin  Aspirin plain, buffered or enteric coated Durasal Myochrisine Trigesic  Aspirin Suppositories Easprin Nalfon Trillsate  Aspirin with Codeine Ecotrin Regular or Extra Strength Naprosyn Uracel  Atromid-S Efficin Naproxen Ursinus  Auranofin Capsules Elmiron Neocylate Vanquish  Axotal Emagrin Norgesic Verin  Azathioprine Empirin or Empirin with Codeine Normiflo Vitamin E  Azolid Emprazil Nuprin Voltaren  Bayer Aspirin plain, buffered or children's or timed BC Tablets or powders Encaprin Orgaran Warfarin Sodium  Buff-a-Comp Enoxaparin Orudis Zorpin  Buff-a-Comp with Codeine Equegesic Os-Cal-Gesic   Buffaprin Excedrin plain, buffered or Extra Strength Oxalid   Bufferin Arthritis Strength Feldene Oxphenbutazone   Bufferin plain or Extra Strength Feldene Capsules Oxycodone with Aspirin   Bufferin with Codeine Fenoprofen Fenoprofen Pabalate or Pabalate-SF   Buffets II Flogesic Panagesic   Buffinol plain or Extra Strength Florinal or Florinal with Codeine Panwarfarin   Buf-Tabs Flurbiprofen Penicillamine   Butalbital Compound Four-way cold tablets Penicillin   Butazolidin Fragmin Pepto-Bismol   Carbenicillin Geminisyn Percodan   Carna Arthritis Reliever Geopen Persantine   Carprofen Gold's salt Persistin   Chloramphenicol Goody's Phenylbutazone   Chloromycetin Haltrain Piroxlcam   Clmetidine heparin Plaquenil   Cllnoril Hyco-pap Ponstel   Clofibrate Hydroxy chloroquine Propoxyphen         Before stopping any of these medications, be sure to consult the physician who ordered them.  Some, such as  Coumadin (Warfarin) are ordered to prevent or treat serious conditions such as "deep thrombosis", "pumonary embolisms", and other heart problems.  The amount of time that you may need off of the medication may also vary with the medication and the reason for which you were taking it.  If you are taking any of these medications, please make sure you notify your pain physician before you undergo any procedures.

## 2016-01-03 NOTE — Progress Notes (Signed)
   Subjective:    Patient ID: [name], female    DOB: April 12, 1946, 70 y.o.   MRN: [id number]  HPI  The patient is a 70 year old female who returns to pain management for further evaluation and treatment of pain involving the neck entire back upper and lower extremity regions. The patient states that she has had significant improvement with prior interventional treatment performed in pain management Center. We discussed patient's condition and patient is without desire to consider surgical intervention due to the extensive degree of involvement should surgery be considered. The patient also states that due to her age she is without desire to consider surgical intervention. We discussed patient's condition and will continue present medications and will consider patient for interventional treatment consisting of block of nerves to the sacroiliac joint to be performed at time of return appointment. The patient will continue present medication of oxycodone and Neurontin. The patient was with understanding and agreed to suggested treatment plan.  Review of Systems     Objective:   Physical Exam  There was tenderness of the splenius capitis and occipitalis muscles are region of moderate degree. There was well-healed surgical scar of the cervical region without increased warmth and erythema in the region of the scar. There was tenderness of the cervical facet cervical paraspinal musculature region of mild to moderate degree with palpation over the thoracic region thoracic facet region reproducing mild to moderate discomfort as well there was no crepitus of the thoracic region noted. The patient appeared to be with bilaterally equal grip strength and Tinel and Phalen's maneuver were without increased pain of significant degree. There was mild to moderate tenderness over the acromioclavicular and glenohumeral joint regions. The patient appeared to be with unremarkable Spurling's maneuver. Palpation over the  region of the lumbar region was a tenderness to palpation of moderately severe degree. Lateral bending and rotation reproduce severe discomfort. There was severe tenderness over the PSIS and PII S region as well as the gluteal and piriformis musculature regions. Straight leg raising was tolerates approximately 20 without a definite increase of pain with dorsiflexion noted. EHL strength appeared to be decreased and there was questionably decreased sensation of the L5 dermatomal distribution. There was negative clonus negative Homans. Abdomen was nontender with no costovertebral tenderness noted      Assessment & Plan:    Denerative disc disease lumbar spine Multilevel degenerative changes lumbar spine, annular disc bulging L4-L5, central disc protrusion L5-S1, L3-4 prominent flattening of the lateral recess and left paracentral disc protrusion  Lumbar facet syndrome  Sacroiliac joint dysfunction  Degenerative joint disease of knee  Degenerative joint disease of hip  Degenerative disc disease cervical spine      PLAN   Continue present medication oxycodone and Neurontin  Block of nerves to the sacroiliac joint to be performed at time of return appointment  F/U PCP Dr. Lisette Grinder III for evaliation of  BP and general medical  condition.    F/U surgical evaluation. May consider pending follow-up evaluations  F/U neurological evaluation. May consider pending follow-up evaluations  May consider radiofrequency rhizolysis or intraspinal procedures pending response to present treatment and F/U evaluation   Patient to call Pain Management Center should patient have concerns prior to scheduled return appointment

## 2016-01-03 NOTE — Progress Notes (Signed)
Safety precautions to be maintained throughout the outpatient stay will include: orient to surroundings, keep bed in low position, maintain call bell within reach at all times, provide assistance with transfer out of bed and ambulation.  

## 2016-01-12 ENCOUNTER — Ambulatory Visit: Payer: Medicare Other | Attending: Pain Medicine | Admitting: Pain Medicine

## 2016-01-12 ENCOUNTER — Encounter: Payer: Self-pay | Admitting: Pain Medicine

## 2016-01-12 VITALS — BP 110/70 | HR 70 | Temp 98.3°F | Resp 16 | Ht <= 58 in | Wt 186.0 lb

## 2016-01-12 DIAGNOSIS — M7060 Trochanteric bursitis, unspecified hip: Secondary | ICD-10-CM

## 2016-01-12 DIAGNOSIS — M545 Low back pain: Secondary | ICD-10-CM | POA: Insufficient documentation

## 2016-01-12 DIAGNOSIS — M47816 Spondylosis without myelopathy or radiculopathy, lumbar region: Secondary | ICD-10-CM | POA: Diagnosis not present

## 2016-01-12 DIAGNOSIS — M5030 Other cervical disc degeneration, unspecified cervical region: Secondary | ICD-10-CM

## 2016-01-12 DIAGNOSIS — M5126 Other intervertebral disc displacement, lumbar region: Secondary | ICD-10-CM | POA: Insufficient documentation

## 2016-01-12 DIAGNOSIS — M48062 Spinal stenosis, lumbar region with neurogenic claudication: Secondary | ICD-10-CM

## 2016-01-12 DIAGNOSIS — M5136 Other intervertebral disc degeneration, lumbar region: Secondary | ICD-10-CM | POA: Diagnosis not present

## 2016-01-12 DIAGNOSIS — M533 Sacrococcygeal disorders, not elsewhere classified: Secondary | ICD-10-CM

## 2016-01-12 DIAGNOSIS — M172 Bilateral post-traumatic osteoarthritis of knee: Secondary | ICD-10-CM

## 2016-01-12 DIAGNOSIS — M79606 Pain in leg, unspecified: Secondary | ICD-10-CM | POA: Diagnosis present

## 2016-01-12 DIAGNOSIS — M170 Bilateral primary osteoarthritis of knee: Secondary | ICD-10-CM

## 2016-01-12 MED ORDER — CEFAZOLIN SODIUM 1 G IJ SOLR
INTRAMUSCULAR | Status: AC
  Administered 2016-01-12: 1 g via INTRAVENOUS
  Filled 2016-01-12: qty 10

## 2016-01-12 MED ORDER — TRIAMCINOLONE ACETONIDE 40 MG/ML IJ SUSP: 40.0000 mg | Freq: Once | INTRAMUSCULAR | Status: DC

## 2016-01-12 MED ORDER — CEFAZOLIN SODIUM 1-5 GM-% IV SOLN: 1.0000 g | Freq: Once | INTRAVENOUS | Status: DC

## 2016-01-12 MED ORDER — LACTATED RINGERS IV SOLN: 1000.0000 mL | INTRAVENOUS | Status: DC

## 2016-01-12 MED ORDER — ORPHENADRINE CITRATE 30 MG/ML IJ SOLN
INTRAMUSCULAR | Status: AC
  Administered 2016-01-12: 11:00:00
  Filled 2016-01-12: qty 2

## 2016-01-12 MED ORDER — FENTANYL CITRATE (PF) 100 MCG/2ML IJ SOLN
INTRAMUSCULAR | Status: AC
  Administered 2016-01-12: 100 ug via INTRAVENOUS
  Filled 2016-01-12: qty 2

## 2016-01-12 MED ORDER — BUPIVACAINE HCL (PF) 0.25 % IJ SOLN: 30.0000 mL | Freq: Once | INTRAMUSCULAR | Status: DC

## 2016-01-12 MED ORDER — TRIAMCINOLONE ACETONIDE 40 MG/ML IJ SUSP
INTRAMUSCULAR | Status: AC
  Administered 2016-01-12: 11:00:00
  Filled 2016-01-12: qty 1

## 2016-01-12 MED ORDER — BUPIVACAINE HCL (PF) 0.25 % IJ SOLN
INTRAMUSCULAR | Status: AC
  Administered 2016-01-12: 11:00:00
  Filled 2016-01-12: qty 30

## 2016-01-12 MED ORDER — MIDAZOLAM HCL 5 MG/5ML IJ SOLN: 5.0000 mg | Freq: Once | INTRAMUSCULAR | Status: DC

## 2016-01-12 MED ORDER — MIDAZOLAM HCL 5 MG/5ML IJ SOLN
INTRAMUSCULAR | Status: AC
  Administered 2016-01-12: 2 mg via INTRAVENOUS
  Filled 2016-01-12: qty 5

## 2016-01-12 MED ORDER — FENTANYL CITRATE (PF) 100 MCG/2ML IJ SOLN: 100.0000 ug | Freq: Once | INTRAMUSCULAR | Status: DC

## 2016-01-12 MED ORDER — ORPHENADRINE CITRATE 30 MG/ML IJ SOLN: 60.0000 mg | Freq: Once | INTRAMUSCULAR | Status: DC

## 2016-01-12 NOTE — Patient Instructions (Signed)
PLAN   Continue present medication Neurontin and oxycodone. Please obtain Ceftin antibiotic today and begin taking Ceftin antibiotic today as prescribed  F/U PCP Dr. Lisette Grinder III for evaliation of  BP and general medical  condition  F/U surgical evaluation. May consider pending follow-up evaluations  F/U neurological evaluation. May consider pending follow-up evaluations  May consider radiofrequency rhizolysis or intraspinal procedures pending response to present treatment and F/U evaluation  Patient to call Pain Management Center should patient have concerns prior to scheduled return appointment

## 2016-01-12 NOTE — Progress Notes (Signed)
Safety precautions to be maintained throughout the outpatient stay will include: orient to surroundings, keep bed in low position, maintain call bell within reach at all times, provide assistance with transfer out of bed and ambulation.  

## 2016-01-12 NOTE — Progress Notes (Signed)
Subjective:    Patient ID: [name], female    DOB: [birth year], 70 y.o.   MRN: [id number]  HPI  PROCEDURE:  Block of nerves to the sacroiliac joint.   NOTE:  The patient is a 70 y.o. female who returns to the [facility] for further evaluation and treatment of pain involving the lower back and lower extremity region with pain in the region of the buttocks as well. Prior MRI studies reveal denerative disc disease lumbar spine Multilevel degenerative changes lumbar spine, annular disc bulging L4-L5, central disc protrusion L5-S1, L3-4 prominent flattening of the lateral recess and left paracentral disc protrusion.  the patient is with reproduction of severe pain with palpation over the PSIS and PII S regions and is with positive Patrick's maneuver  There is concern regarding a significant component of the patient's pain being due to sacroiliac joint dysfunction The risks, benefits, expectations of the procedure have been discussed and explained to the patient who is understanding and willing to proceed with interventional treatment in attempt to decrease severity of patient's symptoms, minimize the risk of medication escalation and  hopefully retard the progression of the patient's symptoms. We will proceed with what is felt to be a medically necessary procedure, block of nerves to the sacroiliac joint.   DESCRIPTION OF PROCEDURE:  Block of nerves to the sacroiliac joint.   The patient was taken to the fluoroscopy suite. With the patient in the prone position with EKG, blood pressure, pulse and pulse oximetry monitoring, IV Versed, IV fentanyl conscious sedation, Betadine prep of proposed entry site was performed.   Block of nerves at the L5 vertebral body level.   With the patient in prone position, under fluoroscopic guidance, a 22 -gauge needle was inserted at the L5 vertebral body level on the left side. With 15 degrees oblique orientation a 22 -gauge needle was inserted in the  region known as Burton's eye or eye of the Scotty dog. Following documentation of needle placement in the area of Burton's eye or eye of the Scotty dog under fluoroscopic guidance, needle placement was then accomplished at the sacral ala level on the left side.   Needle placement at the sacral ala.   With the patient in prone position under fluoroscopic guidance with AP view of the lumbosacral spine, a 22 -gauge needle was inserted in the region known as the sacral ala on the left side. Following documentation of needle placement on the left side under fluoroscopic guidance needle placement was then accomplished at the S1 foramen level.   Needle placement at the S1 foramen level.   With the patient in prone position under fluoroscopic guidance with AP view of the lumbosacral spine and cephalad orientation, a 22 -gauge needle was inserted at the superior and lateral border of the S1 foramen on the left side. Following documentation of needle placement at the S1 foramen level on the left side, needle placement was then accomplished at the S2 foramen level on the left side.   Needle placement at the S2 foramen level.   With the patient in prone position with AP view of the lumbosacral spine with cephalad orientation, a 22 - gauge needle was inserted at the superior and lateral border of the S2 foramen under fluoroscopic guidance on the left side. Following needle placement at the L5 vertebral body level, sacral ala, S1 foramen and S2 foramen on the left side, needle placement was verified on lateral view under fluoroscopic guidance.  Following needle  placement documentation on lateral view, each needle was injected with 1 mL of 0.25% bupivacaine and Kenalog.   BLOCK OF THE NERVES TO SACROILIAC JOINT ON THE RIGHT SIDE The procedure was performed on the right side at the same levels as was performed on the left side and utilizing the same technique as on the left side and was performed under fluoroscopic  guidance as on the left side   Myoneural block injection of the gluteal musculature region Following Betadine prep of proposed entry site a 22-gauge needle was inserted in the gluteal musculature region and following negative aspiration 1 cc of 0.25% bupivacaine with Norflex was injected for myoneural block injection of the gluteal musculature region  2.  The patient tolerated the procedure well   A total of '10mg'$  of Kenalog was utilized for the procedure.   PLAN:  1. Medications: The patient will continue presently prescribed medications Neurontin and oxycodone 2. The patient will be considered for modification of treatment regimen pending response to the procedure performed on today's visit.  3. The patient is to follow-up with primary care physician Dr. Lisette Grinder III  for evaluation of blood pressure and general medical condition following the procedure performed on today's visit.  4. Surgical evaluation as discussed.  5. Neurological evaluation as discussed.  6. The patient may be a candidate for radiofrequency procedures, implantation devices and other treatment pending response to treatment performed on today's visit and follow-up evaluation.  7. The patient has been advised to adhere to proper body mechanics and to avoid activities which may exacerbate the patient's symptoms.   Return appointment to Pain Management Center as scheduled.    Review of Systems     Objective:   Physical Exam        Assessment & Plan:

## 2016-01-13 ENCOUNTER — Telehealth: Payer: Self-pay

## 2016-01-13 NOTE — Telephone Encounter (Signed)
Patient states no problems or complications from procedure yesterday

## 2016-02-01 ENCOUNTER — Ambulatory Visit: Payer: Medicare Other | Attending: Pain Medicine | Admitting: Pain Medicine

## 2016-02-01 ENCOUNTER — Encounter: Payer: Self-pay | Admitting: Pain Medicine

## 2016-02-01 VITALS — BP 118/95 | HR 65 | Temp 98.3°F | Resp 18 | Ht <= 58 in | Wt 178.0 lb

## 2016-02-01 DIAGNOSIS — M172 Bilateral post-traumatic osteoarthritis of knee: Secondary | ICD-10-CM

## 2016-02-01 DIAGNOSIS — M47816 Spondylosis without myelopathy or radiculopathy, lumbar region: Secondary | ICD-10-CM

## 2016-02-01 DIAGNOSIS — M79602 Pain in left arm: Secondary | ICD-10-CM | POA: Diagnosis present

## 2016-02-01 DIAGNOSIS — M79601 Pain in right arm: Secondary | ICD-10-CM | POA: Diagnosis present

## 2016-02-01 DIAGNOSIS — M1610 Unilateral primary osteoarthritis, unspecified hip: Secondary | ICD-10-CM | POA: Insufficient documentation

## 2016-02-01 DIAGNOSIS — M5136 Other intervertebral disc degeneration, lumbar region: Secondary | ICD-10-CM | POA: Diagnosis not present

## 2016-02-01 DIAGNOSIS — M5126 Other intervertebral disc displacement, lumbar region: Secondary | ICD-10-CM | POA: Insufficient documentation

## 2016-02-01 DIAGNOSIS — M5030 Other cervical disc degeneration, unspecified cervical region: Secondary | ICD-10-CM

## 2016-02-01 DIAGNOSIS — M7060 Trochanteric bursitis, unspecified hip: Secondary | ICD-10-CM

## 2016-02-01 DIAGNOSIS — M542 Cervicalgia: Secondary | ICD-10-CM | POA: Diagnosis present

## 2016-02-01 DIAGNOSIS — M170 Bilateral primary osteoarthritis of knee: Secondary | ICD-10-CM

## 2016-02-01 DIAGNOSIS — M533 Sacrococcygeal disorders, not elsewhere classified: Secondary | ICD-10-CM | POA: Insufficient documentation

## 2016-02-01 DIAGNOSIS — M4806 Spinal stenosis, lumbar region: Secondary | ICD-10-CM | POA: Insufficient documentation

## 2016-02-01 DIAGNOSIS — M48062 Spinal stenosis, lumbar region with neurogenic claudication: Secondary | ICD-10-CM

## 2016-02-01 DIAGNOSIS — M179 Osteoarthritis of knee, unspecified: Secondary | ICD-10-CM | POA: Diagnosis not present

## 2016-02-01 MED ORDER — OXYCODONE HCL 5 MG PO CAPS: ORAL_CAPSULE | ORAL | Status: DC

## 2016-02-01 MED ORDER — GABAPENTIN 300 MG PO CAPS: ORAL_CAPSULE | ORAL | Status: DC

## 2016-02-01 NOTE — Patient Instructions (Addendum)
PLAN   Continue present medication oxycodone and Neurontin  Lumbar/caudal epidural steroid injection to be performed at time of return appointment  F/U PCP Dr. Lisette Grinder III for evaliation of  BP and general medical  condition.    F/U surgical evaluation. May consider pending follow-up evaluations  F/U neurological evaluation. May consider pending follow-up evaluations  May consider radiofrequency rhizolysis or intraspinal procedures pending response to present treatment and F/U evaluation   Patient to call [facility] should patient have concerns prior to scheduled return appointmentGENERAL RISKS AND COMPLICATIONS  What are the risk, side effects and possible complications? Generally speaking, most procedures are safe.  However, with any procedure there are risks, side effects, and the possibility of complications.  The risks and complications are dependent upon the sites that are lesioned, or the type of nerve block to be performed.  The closer the procedure is to the spine, the more serious the risks are.  Great care is taken when placing the radio frequency needles, block needles or lesioning probes, but sometimes complications can occur. 1. Infection: Any time there is an injection through the skin, there is a risk of infection.  This is why sterile conditions are used for these blocks.  There are four possible types of infection. 1. Localized skin infection. 2. Central Nervous System Infection-This can be in the form of Meningitis, which can be deadly. 3. Epidural Infections-This can be in the form of an epidural abscess, which can cause pressure inside of the spine, causing compression of the spinal cord with subsequent paralysis. This would require an emergency surgery to decompress, and there are no guarantees that the patient would recover from the paralysis. 4. Discitis-This is an infection of the intervertebral discs.  It occurs in about 1% of discography procedures.   It is difficult to treat and it may lead to surgery.        2. Pain: the needles have to go through skin and soft tissues, will cause soreness.       3. Damage to internal structures:  The nerves to be lesioned may be near blood vessels or    other nerves which can be potentially damaged.       4. Bleeding: Bleeding is more common if the patient is taking blood thinners such as  aspirin, Coumadin, Ticiid, Plavix, etc., or if he/she have some genetic predisposition  such as hemophilia. Bleeding into the spinal canal can cause compression of the spinal  cord with subsequent paralysis.  This would require an emergency surgery to  decompress and there are no guarantees that the patient would recover from the  paralysis.       5. Pneumothorax:  Puncturing of a lung is a possibility, every time a needle is introduced in  the area of the chest or upper back.  Pneumothorax refers to free air around the  collapsed lung(s), inside of the thoracic cavity (chest cavity).  Another two possible  complications related to a similar event would include: Hemothorax and Chylothorax.   These are variations of the Pneumothorax, where instead of air around the collapsed  lung(s), you may have blood or chyle, respectively.       6. Spinal headaches: They may occur with any procedures in the area of the spine.       7. Persistent CSF (Cerebro-Spinal Fluid) leakage: This is a rare problem, but may occur  with prolonged intrathecal or epidural catheters either due to the formation of a fistulous  track or a dural tear.       8. Nerve damage: By working so close to the spinal cord, there is always a possibility of  nerve damage, which could be as serious as a permanent spinal cord injury with  paralysis.       9. Death:  Although rare, severe deadly allergic reactions known as "Anaphylactic  reaction" can occur to any of the medications used.      10. Worsening of the symptoms:  We can always make thing worse.  What are the  chances of something like this happening? Chances of any of this occuring are extremely low.  By statistics, you have more of a chance of getting killed in a motor vehicle accident: while driving to the hospital than any of the above occurring .  Nevertheless, you should be aware that they are possibilities.  In general, it is similar to taking a shower.  Everybody knows that you can slip, hit your head and get killed.  Does that mean that you should not shower again?  Nevertheless always keep in mind that statistics do not mean anything if you happen to be on the wrong side of them.  Even if a procedure has a 1 (one) in a 1,000,000 (million) chance of going wrong, it you happen to be that one..Also, keep in mind that by statistics, you have more of a chance of having something go wrong when taking medications.  Who should not have this procedure? If you are on a blood thinning medication (e.g. Coumadin, Plavix, see list of "Blood Thinners"), or if you have an active infection going on, you should not have the procedure.  If you are taking any blood thinners, please inform your physician.  How should I prepare for this procedure?  Do not eat or drink anything at least six hours prior to the procedure.  Bring a driver with you .  It cannot be a taxi.  Come accompanied by an adult that can drive you back, and that is strong enough to help you if your legs get weak or numb from the local anesthetic.  Take all of your medicines the morning of the procedure with just enough water to swallow them.  If you have diabetes, make sure that you are scheduled to have your procedure done first thing in the morning, whenever possible.  If you have diabetes, take only half of your insulin dose and notify our nurse that you have done so as soon as you arrive at the clinic.  If you are diabetic, but only take blood sugar pills (oral hypoglycemic), then do not take them on the morning of your procedure.  You may  take them after you have had the procedure.  Do not take aspirin or any aspirin-containing medications, at least eleven (11) days prior to the procedure.  They may prolong bleeding.  Wear loose fitting clothing that may be easy to take off and that you would not mind if it got stained with Betadine or blood.  Do not wear any jewelry or perfume  Remove any nail coloring.  It will interfere with some of our monitoring equipment.  NOTE: Remember that this is not meant to be interpreted as a complete list of all possible complications.  Unforeseen problems may occur.  BLOOD THINNERS The following drugs contain aspirin or other products, which can cause increased bleeding during surgery and should not be taken for 2 weeks prior to and 1 week after surgery.  If you should need take something for relief of minor pain, you may take acetaminophen which is found in Tylenol,m Datril, Anacin-3 and Panadol. It is not blood thinner. The products listed below are.  Do not take any of the products listed below in addition to any listed on your instruction sheet.  A.P.C or A.P.C with Codeine Codeine Phosphate Capsules #3 Ibuprofen Ridaura  ABC compound Congesprin Imuran rimadil  Advil Cope Indocin Robaxisal  Alka-Seltzer Effervescent Pain Reliever and Antacid Coricidin or Coricidin-D  Indomethacin Rufen  Alka-Seltzer plus Cold Medicine Cosprin Ketoprofen S-A-C Tablets  Anacin Analgesic Tablets or Capsules Coumadin Korlgesic Salflex  Anacin Extra Strength Analgesic tablets or capsules CP-2 Tablets Lanoril Salicylate  Anaprox Cuprimine Capsules Levenox Salocol  Anexsia-D Dalteparin Magan Salsalate  Anodynos Darvon compound Magnesium Salicylate Sine-off  Ansaid Dasin Capsules Magsal Sodium Salicylate  Anturane Depen Capsules Marnal Soma  APF Arthritis pain formula Dewitt's Pills Measurin Stanback  Argesic Dia-Gesic Meclofenamic Sulfinpyrazone  Arthritis Bayer Timed Release Aspirin Diclofenac Meclomen  Sulindac  Arthritis pain formula Anacin Dicumarol Medipren Supac  Analgesic (Safety coated) Arthralgen Diffunasal Mefanamic Suprofen  Arthritis Strength Bufferin Dihydrocodeine Mepro Compound Suprol  Arthropan liquid Dopirydamole Methcarbomol with Aspirin Synalgos  ASA tablets/Enseals Disalcid Micrainin Tagament  Ascriptin Doan's Midol Talwin  Ascriptin A/D Dolene Mobidin Tanderil  Ascriptin Extra Strength Dolobid Moblgesic Ticlid  Ascriptin with Codeine Doloprin or Doloprin with Codeine Momentum Tolectin  Asperbuf Duoprin Mono-gesic Trendar  Aspergum Duradyne Motrin or Motrin IB Triminicin  Aspirin plain, buffered or enteric coated Durasal Myochrisine Trigesic  Aspirin Suppositories Easprin Nalfon Trillsate  Aspirin with Codeine Ecotrin Regular or Extra Strength Naprosyn Uracel  Atromid-S Efficin Naproxen Ursinus  Auranofin Capsules Elmiron Neocylate Vanquish  Axotal Emagrin Norgesic Verin  Azathioprine Empirin or Empirin with Codeine Normiflo Vitamin E  Azolid Emprazil Nuprin Voltaren  Bayer Aspirin plain, buffered or children's or timed BC Tablets or powders Encaprin Orgaran Warfarin Sodium  Buff-a-Comp Enoxaparin Orudis Zorpin  Buff-a-Comp with Codeine Equegesic Os-Cal-Gesic   Buffaprin Excedrin plain, buffered or Extra Strength Oxalid   Bufferin Arthritis Strength Feldene Oxphenbutazone   Bufferin plain or Extra Strength Feldene Capsules Oxycodone with Aspirin   Bufferin with Codeine Fenoprofen Fenoprofen Pabalate or Pabalate-SF   Buffets II Flogesic Panagesic   Buffinol plain or Extra Strength Florinal or Florinal with Codeine Panwarfarin   Buf-Tabs Flurbiprofen Penicillamine   Butalbital Compound Four-way cold tablets Penicillin   Butazolidin Fragmin Pepto-Bismol   Carbenicillin Geminisyn Percodan   Carna Arthritis Reliever Geopen Persantine   Carprofen Gold's salt Persistin   Chloramphenicol Goody's Phenylbutazone   Chloromycetin Haltrain Piroxlcam   Clmetidine heparin  Plaquenil   Cllnoril Hyco-pap Ponstel   Clofibrate Hydroxy chloroquine Propoxyphen         Before stopping any of these medications, be sure to consult the physician who ordered them.  Some, such as Coumadin (Warfarin) are ordered to prevent or treat serious conditions such as "deep thrombosis", "pumonary embolisms", and other heart problems.  The amount of time that you may need off of the medication may also vary with the medication and the reason for which you were taking it.  If you are taking any of these medications, please make sure you notify your pain physician before you undergo any procedures.         Epidural Steroid Injection Patient Information  Description: The epidural space surrounds the nerves as they exit the spinal cord.  In some patients, the nerves can be compressed and inflamed by a bulging disc  or a tight spinal canal (spinal stenosis).  By injecting steroids into the epidural space, we can bring irritated nerves into direct contact with a potentially helpful medication.  These steroids act directly on the irritated nerves and can reduce swelling and inflammation which often leads to decreased pain.  Epidural steroids may be injected anywhere along the spine and from the neck to the low back depending upon the location of your pain.   After numbing the skin with local anesthetic (like Novocaine), a small needle is passed into the epidural space slowly.  You may experience a sensation of pressure while this is being done.  The entire block usually last less than 10 minutes.  Conditions which may be treated by epidural steroids:   Low back and leg pain  Neck and arm pain  Spinal stenosis  Post-laminectomy syndrome  Herpes zoster (shingles) pain  Pain from compression fractures  Preparation for the injection:  1. Do not eat any solid food or dairy products within 8 hours of your appointment.  2. You may drink clear liquids up to 3 hours before appointment.   Clear liquids include water, black coffee, juice or soda.  No milk or cream please. 3. You may take your regular medication, including pain medications, with a sip of water before your appointment  Diabetics should hold regular insulin (if taken separately) and take 1/2 normal NPH dos the morning of the procedure.  Carry some sugar containing items with you to your appointment. 4. A driver must accompany you and be prepared to drive you home after your procedure.  5. Bring all your current medications with your. 6. An IV may be inserted and sedation may be given at the discretion of the physician.   7. A blood pressure cuff, EKG and other monitors will often be applied during the procedure.  Some patients may need to have extra oxygen administered for a short period. 8. You will be asked to provide medical information, including your allergies, prior to the procedure.  We must know immediately if you are taking blood thinners (like Coumadin/Warfarin)  Or if you are allergic to IV iodine contrast (dye). We must know if you could possible be pregnant.  Possible side-effects:  Bleeding from needle site  Infection (rare, may require surgery)  Nerve injury (rare)  Numbness & tingling (temporary)  Difficulty urinating (rare, temporary)  Spinal headache ( a headache worse with upright posture)  Light -headedness (temporary)  Pain at injection site (several days)  Decreased blood pressure (temporary)  Weakness in arm/leg (temporary)  Pressure sensation in back/neck (temporary)  Call if you experience:  Fever/chills associated with headache or increased back/neck pain.  Headache worsened by an upright position.  New onset weakness or numbness of an extremity below the injection site  Hives or difficulty breathing (go to the emergency room)  Inflammation or drainage at the infection site  Severe back/neck pain  Any new symptoms which are concerning to you  Please  note:  Although the local anesthetic injected can often make your back or neck feel good for several hours after the injection, the pain will likely return.  It takes 3-7 days for steroids to work in the epidural space.  You may not notice any pain relief for at least that one week.  If effective, we will often do a series of three injections spaced 3-6 weeks apart to maximally decrease your pain.  After the initial series, we generally will wait several months before considering  a repeat injection of the same type.  If you have any questions, please call [phone] [facility]

## 2016-02-01 NOTE — Progress Notes (Signed)
Subjective:    Patient ID: [name], female    DOB: Mar 04, 1946, 70 y.o.   MRN: [id number]  HPI  Patient is a 70 year old female who returns to pain management for further evaluation and treatment of pain involving the neck upper extremity region as well as the mid lower back and lower extremity region. The patient states that the most bothersome pain involves the increased weakness and pain of the lower extremities associated with standing and walking for any significant period of time. The patient is undergone neurosurgical evaluation without recommendation for surgical intervention and has been recommended to continue treatment in pain management  center. We discussed patient's condition on today's visit and we'll continue Neurontin as well as oxycodone and we'll consider patient for lumbar/caudal epidural steroid injection at time return appointment as discussed and as explained to patient on today's visit. The patient denied any trauma change in events of daily living the call significant change in symptomatology and stated the pain and weakness of the lower back lower extremity region became more intense as patient spent more time on the feet and was most severe in the evening after patient had been on feet for significant period of time. We will proceed with interventional treatment in attempt to decrease severity of patient's symptoms, minimize progression of patient's symptoms, and avoid the need for more involved treatment. All agreed to suggested treatment plan.     Review of Systems     Objective:   Physical Exam  There was tenderness to palpation of the splenius capitis and occipitalis musculature regions of moderate degree with moderate tenderness over the cervical facet cervical paraspinal musculature region. Palpation over the thoracic facet thoracic paraspinal musculature region was with moderate muscle spasm of the upper mid and lower thoracic paraspinal musculature region  without crepitus of the thoracic region noted. The patient was attends to palpation of the acromioclavicular and glenohumeral joint regions of mild degree with unremarkable Spurling's maneuver. Tinel and Phalen's maneuver were without increase of pain of significant degree and patient appeared to be with bilaterally equal grip strength. Palpation over the lumbar paraspinal must reason lumbar facet region was with moderate to severe discomfort with lateral bending rotation extension and palpation of the lumbar facets reproducing moderately severe discomfort. There was tenderness over the PSIS and PII S region a moderate degree as well with mild tenderness on the greater trochanteric region iliotibial band region. There was kyphoscoliosis of the thoracic oh lumbar spine noted with palpation of the paraspinal muscles region reproducing moderate discomfort due to moderate muscle spasms. Straight leg raising was tolerates approximately 20 without increased pain with dorsiflexion noted. No definite sensory deficit or dermatomal dystrophy detected. EHL strength appeared to be decreased. There was negative clonus negative Homans. Abdomen nontender with no costovertebral tenderness noted      Assessment & Plan:      Denerative disc disease lumbar spine Multilevel degenerative changes lumbar spine, annular disc bulging L4-L5, central disc protrusion L5-S1, L3-4 prominent flattening of the lateral recess and left paracentral disc protrusion  Lumbar stenosis with neurogenic claudication  Lumbar facet syndrome  Sacroiliac joint dysfunction  Degenerative joint disease of knee  Degenerative joint disease of hip  Degenerative disc disease cervical spine       PLAN   Continue present medication oxycodone and Neurontin  Lumbar/caudal epidural steroid injection to be performed at time of return appointment  F/U PCP Dr. Lisette Grinder III for evaliation of  BP and general medical  condition.    F/U  surgical evaluation. May consider pending follow-up evaluations  F/U neurological evaluation. May consider pending follow-up evaluations  May consider radiofrequency rhizolysis or intraspinal procedures pending response to present treatment and F/U evaluation   Patient to call Pain Management Center should patient have concerns prior to scheduled return appointment

## 2016-02-01 NOTE — Progress Notes (Signed)
Safety precautions to be maintained throughout the outpatient stay will include: orient to surroundings, keep bed in low position, maintain call bell within reach at all times, provide assistance with transfer out of bed and ambulation.  

## 2016-02-07 ENCOUNTER — Encounter: Payer: Self-pay | Admitting: Pain Medicine

## 2016-02-07 ENCOUNTER — Ambulatory Visit: Payer: Medicare Other | Attending: Pain Medicine | Admitting: Pain Medicine

## 2016-02-07 VITALS — BP 129/79 | HR 62 | Temp 97.7°F | Resp 24 | Ht <= 58 in | Wt 180.0 lb

## 2016-02-07 DIAGNOSIS — M545 Low back pain: Secondary | ICD-10-CM | POA: Insufficient documentation

## 2016-02-07 DIAGNOSIS — M5136 Other intervertebral disc degeneration, lumbar region: Secondary | ICD-10-CM | POA: Insufficient documentation

## 2016-02-07 DIAGNOSIS — M7060 Trochanteric bursitis, unspecified hip: Secondary | ICD-10-CM

## 2016-02-07 DIAGNOSIS — M47816 Spondylosis without myelopathy or radiculopathy, lumbar region: Secondary | ICD-10-CM | POA: Diagnosis not present

## 2016-02-07 DIAGNOSIS — M533 Sacrococcygeal disorders, not elsewhere classified: Secondary | ICD-10-CM

## 2016-02-07 DIAGNOSIS — M5126 Other intervertebral disc displacement, lumbar region: Secondary | ICD-10-CM | POA: Diagnosis not present

## 2016-02-07 DIAGNOSIS — M48062 Spinal stenosis, lumbar region with neurogenic claudication: Secondary | ICD-10-CM

## 2016-02-07 DIAGNOSIS — M5030 Other cervical disc degeneration, unspecified cervical region: Secondary | ICD-10-CM

## 2016-02-07 DIAGNOSIS — M172 Bilateral post-traumatic osteoarthritis of knee: Secondary | ICD-10-CM

## 2016-02-07 DIAGNOSIS — M79606 Pain in leg, unspecified: Secondary | ICD-10-CM | POA: Diagnosis present

## 2016-02-07 DIAGNOSIS — M170 Bilateral primary osteoarthritis of knee: Secondary | ICD-10-CM

## 2016-02-07 MED ORDER — MIDAZOLAM HCL 5 MG/5ML IJ SOLN
INTRAMUSCULAR | Status: AC
  Filled 2016-02-07: qty 5

## 2016-02-07 MED ORDER — LACTATED RINGERS IV SOLN: 1000.0000 mL | INTRAVENOUS | Status: DC

## 2016-02-07 MED ORDER — LIDOCAINE HCL (PF) 1 % IJ SOLN: 10.0000 mL | Freq: Once | INTRAMUSCULAR | Status: DC

## 2016-02-07 MED ORDER — TRIAMCINOLONE ACETONIDE 40 MG/ML IJ SUSP
INTRAMUSCULAR | Status: AC
  Filled 2016-02-07: qty 1

## 2016-02-07 MED ORDER — BUPIVACAINE HCL (PF) 0.25 % IJ SOLN
INTRAMUSCULAR | Status: AC
  Filled 2016-02-07: qty 30

## 2016-02-07 MED ORDER — FENTANYL CITRATE (PF) 100 MCG/2ML IJ SOLN
INTRAMUSCULAR | Status: AC
  Administered 2016-02-07: 100 ug via INTRAVENOUS
  Filled 2016-02-07: qty 2

## 2016-02-07 MED ORDER — CEFAZOLIN SODIUM 1 G IJ SOLR
INTRAMUSCULAR | Status: AC
  Administered 2016-02-07: 11:00:00 via INTRAVENOUS
  Filled 2016-02-07: qty 10

## 2016-02-07 MED ORDER — SODIUM CHLORIDE 0.9% FLUSH
20.0000 mL | Freq: Once | INTRAVENOUS | Status: DC
Start: 2016-02-07 — End: 2016-11-08

## 2016-02-07 MED ORDER — FENTANYL CITRATE (PF) 100 MCG/2ML IJ SOLN: 100.0000 ug | Freq: Once | INTRAMUSCULAR | Status: DC

## 2016-02-07 MED ORDER — ORPHENADRINE CITRATE 30 MG/ML IJ SOLN
INTRAMUSCULAR | Status: AC
  Filled 2016-02-07: qty 2

## 2016-02-07 MED ORDER — CEFAZOLIN SODIUM 1-5 GM-% IV SOLN: 1.0000 g | Freq: Once | INTRAVENOUS | Status: DC

## 2016-02-07 MED ORDER — MIDAZOLAM HCL 5 MG/5ML IJ SOLN: 5.0000 mg | Freq: Once | INTRAMUSCULAR | Status: DC

## 2016-02-07 MED ORDER — CEFUROXIME AXETIL 250 MG PO TABS: 250.0000 mg | ORAL_TABLET | Freq: Two times a day (BID) | ORAL | Status: DC

## 2016-02-07 MED ORDER — LIDOCAINE HCL (PF) 1 % IJ SOLN
INTRAMUSCULAR | Status: AC
  Administered 2016-02-07: 11:00:00
  Filled 2016-02-07: qty 5

## 2016-02-07 MED ORDER — TRIAMCINOLONE ACETONIDE 40 MG/ML IJ SUSP: 40.0000 mg | Freq: Once | INTRAMUSCULAR | Status: DC

## 2016-02-07 MED ORDER — ORPHENADRINE CITRATE 30 MG/ML IJ SOLN: 60.0000 mg | Freq: Once | INTRAMUSCULAR | Status: DC

## 2016-02-07 MED ORDER — BUPIVACAINE HCL (PF) 0.25 % IJ SOLN: 30.0000 mL | Freq: Once | INTRAMUSCULAR | Status: DC

## 2016-02-07 MED ORDER — SODIUM CHLORIDE 0.9 % IJ SOLN
INTRAMUSCULAR | Status: AC
  Administered 2016-02-07: 11:00:00
  Filled 2016-02-07: qty 20

## 2016-02-07 NOTE — Progress Notes (Signed)
   Subjective:    Patient ID: [name], female    DOB: 1946/04/08, 70 y.o.   MRN: [id number]  HPI  PROCEDURE PERFORMED: Lumbar epidural steroid injection   NOTE: The patient is a 70 y.o. female who returns to [place] for further evaluation and treatment of pain involving the lumbar and lower extremity region. MRI revealed the patient to be with Denerative disc disease lumbar spine Multilevel degenerative changes lumbar spine, annular disc bulging L4-L5, central disc protrusion L5-S1, L3-4 prominent flattening of the lateral recess and left paracentral disc protrusion. There is concern regarding significant component of patient's pain being due to lumbar stenosis with neurogenic claudication The risks, benefits, and expectations of the procedure have been discussed and explained to the patient who was understanding and in agreement with suggested treatment plan. We will proceed with lumbar epidural steroid injection as discussed and as explained to the patient who is willing to proceed with procedure as planned.   DESCRIPTION OF PROCEDURE: Lumbar epidural steroid injection with IV Versed, IV fentanyl conscious sedation, EKG, blood pressure, pulse, and pulse oximetry monitoring. The procedure was performed with the patient in the prone position under fluoroscopic guidance. A local anesthetic skin wheal of 1.5% plain lidocaine was accomplished at proposed entry site. An 18-gauge Tuohy epidural needle was inserted at the L 4 vertebral body level right of the midline via loss-of-resistance technique with negative heme and negative CSF return. A total of 4 mL of Preservative-Free normal saline with 40 mg of Kenalog injected incrementally via epidurally placed needle. Needle was removed.  Myoneural block injections of the lumbar paraspinal musculature region Following Betadine prep of proposed entry site a 22-gauge needle was inserted into the lumbar paraspinal musculature region and  following negative aspiration 2 cc of 0.25% bupivacaine with Norflex was injected for myoneural block injection of the lumbar paraspinal musculature region 4  A total of 40 mg of Kenalog was utilized for the procedure.   The patient tolerated the injection well.    PLAN:   1. Medications: We will continue presently prescribed medications Neurontin and oxycodone 2. Will consider modification of treatment regimen pending response to treatment rendered on today's visit and follow-up evaluation. 3. The patient is to follow-up with primary care physician Dr. Lerry Paterson  regarding blood pressure and general medical condition status post lumbar epidural steroid injection performed on today's visit. 4. Surgical evaluation.. The patient is undergone surgical evaluation and is without plan for surgical intervention  5. Neurological evaluation.Marland Kitchen Has been addressed. May consider [facility] EMG studies  6. The patient may be a candidate for radiofrequency procedures, implantation device, and other treatment pending response to treatment and follow-up evaluation. 7. The patient has been advised to adhere to proper body mechanics and avoid activities which appear to aggravate condition. 8. The patient has been advised to call the Pain Management Center prior to scheduled return appointment should there be significant change in condition or should there be sign  The patient is understanding and agrees with the suggested  treatment plan   Review of Systems     Objective:   Physical Exam        Assessment & Plan:

## 2016-02-07 NOTE — Progress Notes (Signed)
Patient her for procedure d/t lower back pain. Safety precautions to be maintained throughout the outpatient stay will include: orient to surroundings, keep bed in low position, maintain call bell within reach at all times, provide assistance with transfer out of bed and ambulation.

## 2016-02-07 NOTE — Patient Instructions (Addendum)
PLAN   Continue present medication Neurontin and oxycodone. Please obtain Ceftin antibiotic today and begin taking Ceftin antibiotic today as prescribed  F/U PCP Dr. Lisette Grinder III for evaliation of  BP and general medical  condition  F/U surgical evaluation. May consider pending follow-up evaluations  F/U neurological evaluation. May consider pending follow-up evaluations  May consider radiofrequency rhizolysis or intraspinal procedures pending response to present treatment and F/U evaluation  Patient to call [facility] should patient have concerns prior to scheduled return appointmentGENERAL RISKS AND COMPLICATIONS  What are the risk, side effects and possible complications? Generally speaking, most procedures are safe.  However, with any procedure there are risks, side effects, and the possibility of complications.  The risks and complications are dependent upon the sites that are lesioned, or the type of nerve block to be performed.  The closer the procedure is to the spine, the more serious the risks are.  Great care is taken when placing the radio frequency needles, block needles or lesioning probes, but sometimes complications can occur. 1. Infection: Any time there is an injection through the skin, there is a risk of infection.  This is why sterile conditions are used for these blocks.  There are four possible types of infection. 1. Localized skin infection. 2. Central Nervous System Infection-This can be in the form of Meningitis, which can be deadly. 3. Epidural Infections-This can be in the form of an epidural abscess, which can cause pressure inside of the spine, causing compression of the spinal cord with subsequent paralysis. This would require an emergency surgery to decompress, and there are no guarantees that the patient would recover from the paralysis. 4. Discitis-This is an infection of the intervertebral discs.  It occurs in about 1% of discography procedures.   It is difficult to treat and it may lead to surgery.        2. Pain: the needles have to go through skin and soft tissues, will cause soreness.       3. Damage to internal structures:  The nerves to be lesioned may be near blood vessels or    other nerves which can be potentially damaged.       4. Bleeding: Bleeding is more common if the patient is taking blood thinners such as  aspirin, Coumadin, Ticiid, Plavix, etc., or if he/she have some genetic predisposition  such as hemophilia. Bleeding into the spinal canal can cause compression of the spinal  cord with subsequent paralysis.  This would require an emergency surgery to  decompress and there are no guarantees that the patient would recover from the  paralysis.       5. Pneumothorax:  Puncturing of a lung is a possibility, every time a needle is introduced in  the area of the chest or upper back.  Pneumothorax refers to free air around the  collapsed lung(s), inside of the thoracic cavity (chest cavity).  Another two possible  complications related to a similar event would include: Hemothorax and Chylothorax.   These are variations of the Pneumothorax, where instead of air around the collapsed  lung(s), you may have blood or chyle, respectively.       6. Spinal headaches: They may occur with any procedures in the area of the spine.       7. Persistent CSF (Cerebro-Spinal Fluid) leakage: This is a rare problem, but may occur  with prolonged intrathecal or epidural catheters either due to the formation of a fistulous  track or  a dural tear.       8. Nerve damage: By working so close to the spinal cord, there is always a possibility of  nerve damage, which could be as serious as a permanent spinal cord injury with  paralysis.       9. Death:  Although rare, severe deadly allergic reactions known as "Anaphylactic  reaction" can occur to any of the medications used.      10. Worsening of the symptoms:  We can always make thing worse.  What are the  chances of something like this happening? Chances of any of this occuring are extremely low.  By statistics, you have more of a chance of getting killed in a motor vehicle accident: while driving to the hospital than any of the above occurring .  Nevertheless, you should be aware that they are possibilities.  In general, it is similar to taking a shower.  Everybody knows that you can slip, hit your head and get killed.  Does that mean that you should not shower again?  Nevertheless always keep in mind that statistics do not mean anything if you happen to be on the wrong side of them.  Even if a procedure has a 1 (one) in a 1,000,000 (million) chance of going wrong, it you happen to be that one..Also, keep in mind that by statistics, you have more of a chance of having something go wrong when taking medications.  Who should not have this procedure? If you are on a blood thinning medication (e.g. Coumadin, Plavix, see list of "Blood Thinners"), or if you have an active infection going on, you should not have the procedure.  If you are taking any blood thinners, please inform your physician.  How should I prepare for this procedure?  Do not eat or drink anything at least six hours prior to the procedure.  Bring a driver with you .  It cannot be a taxi.  Come accompanied by an adult that can drive you back, and that is strong enough to help you if your legs get weak or numb from the local anesthetic.  Take all of your medicines the morning of the procedure with just enough water to swallow them.  If you have diabetes, make sure that you are scheduled to have your procedure done first thing in the morning, whenever possible.  If you have diabetes, take only half of your insulin dose and notify our nurse that you have done so as soon as you arrive at the clinic.  If you are diabetic, but only take blood sugar pills (oral hypoglycemic), then do not take them on the morning of your procedure.  You may  take them after you have had the procedure.  Do not take aspirin or any aspirin-containing medications, at least eleven (11) days prior to the procedure.  They may prolong bleeding.  Wear loose fitting clothing that may be easy to take off and that you would not mind if it got stained with Betadine or blood.  Do not wear any jewelry or perfume  Remove any nail coloring.  It will interfere with some of our monitoring equipment.  NOTE: Remember that this is not meant to be interpreted as a complete list of all possible complications.  Unforeseen problems may occur.  BLOOD THINNERS The following drugs contain aspirin or other products, which can cause increased bleeding during surgery and should not be taken for 2 weeks prior to and 1 week after surgery.  If  you should need take something for relief of minor pain, you may take acetaminophen which is found in Tylenol,m Datril, Anacin-3 and Panadol. It is not blood thinner. The products listed below are.  Do not take any of the products listed below in addition to any listed on your instruction sheet.  A.P.C or A.P.C with Codeine Codeine Phosphate Capsules #3 Ibuprofen Ridaura  ABC compound Congesprin Imuran rimadil  Advil Cope Indocin Robaxisal  Alka-Seltzer Effervescent Pain Reliever and Antacid Coricidin or Coricidin-D  Indomethacin Rufen  Alka-Seltzer plus Cold Medicine Cosprin Ketoprofen S-A-C Tablets  Anacin Analgesic Tablets or Capsules Coumadin Korlgesic Salflex  Anacin Extra Strength Analgesic tablets or capsules CP-2 Tablets Lanoril Salicylate  Anaprox Cuprimine Capsules Levenox Salocol  Anexsia-D Dalteparin Magan Salsalate  Anodynos Darvon compound Magnesium Salicylate Sine-off  Ansaid Dasin Capsules Magsal Sodium Salicylate  Anturane Depen Capsules Marnal Soma  APF Arthritis pain formula Dewitt's Pills Measurin Stanback  Argesic Dia-Gesic Meclofenamic Sulfinpyrazone  Arthritis Bayer Timed Release Aspirin Diclofenac Meclomen  Sulindac  Arthritis pain formula Anacin Dicumarol Medipren Supac  Analgesic (Safety coated) Arthralgen Diffunasal Mefanamic Suprofen  Arthritis Strength Bufferin Dihydrocodeine Mepro Compound Suprol  Arthropan liquid Dopirydamole Methcarbomol with Aspirin Synalgos  ASA tablets/Enseals Disalcid Micrainin Tagament  Ascriptin Doan's Midol Talwin  Ascriptin A/D Dolene Mobidin Tanderil  Ascriptin Extra Strength Dolobid Moblgesic Ticlid  Ascriptin with Codeine Doloprin or Doloprin with Codeine Momentum Tolectin  Asperbuf Duoprin Mono-gesic Trendar  Aspergum Duradyne Motrin or Motrin IB Triminicin  Aspirin plain, buffered or enteric coated Durasal Myochrisine Trigesic  Aspirin Suppositories Easprin Nalfon Trillsate  Aspirin with Codeine Ecotrin Regular or Extra Strength Naprosyn Uracel  Atromid-S Efficin Naproxen Ursinus  Auranofin Capsules Elmiron Neocylate Vanquish  Axotal Emagrin Norgesic Verin  Azathioprine Empirin or Empirin with Codeine Normiflo Vitamin E  Azolid Emprazil Nuprin Voltaren  Bayer Aspirin plain, buffered or children's or timed BC Tablets or powders Encaprin Orgaran Warfarin Sodium  Buff-a-Comp Enoxaparin Orudis Zorpin  Buff-a-Comp with Codeine Equegesic Os-Cal-Gesic   Buffaprin Excedrin plain, buffered or Extra Strength Oxalid   Bufferin Arthritis Strength Feldene Oxphenbutazone   Bufferin plain or Extra Strength Feldene Capsules Oxycodone with Aspirin   Bufferin with Codeine Fenoprofen Fenoprofen Pabalate or Pabalate-SF   Buffets II Flogesic Panagesic   Buffinol plain or Extra Strength Florinal or Florinal with Codeine Panwarfarin   Buf-Tabs Flurbiprofen Penicillamine   Butalbital Compound Four-way cold tablets Penicillin   Butazolidin Fragmin Pepto-Bismol   Carbenicillin Geminisyn Percodan   Carna Arthritis Reliever Geopen Persantine   Carprofen Gold's salt Persistin   Chloramphenicol Goody's Phenylbutazone   Chloromycetin Haltrain Piroxlcam   Clmetidine heparin  Plaquenil   Cllnoril Hyco-pap Ponstel   Clofibrate Hydroxy chloroquine Propoxyphen         Before stopping any of these medications, be sure to consult the physician who ordered them.  Some, such as Coumadin (Warfarin) are ordered to prevent or treat serious conditions such as "deep thrombosis", "pumonary embolisms", and other heart problems.  The amount of time that you may need off of the medication may also vary with the medication and the reason for which you were taking it.  If you are taking any of these medications, please make sure you notify your pain physician before you undergo any procedures.

## 2016-02-08 ENCOUNTER — Telehealth: Payer: Self-pay | Admitting: *Deleted

## 2016-02-08 NOTE — Telephone Encounter (Signed)
Spoke with patient denies any questions or concerns re; procedure on yesterday.

## 2016-02-29 ENCOUNTER — Encounter: Payer: Self-pay | Admitting: Pain Medicine

## 2016-02-29 ENCOUNTER — Ambulatory Visit: Payer: Medicare Other | Attending: Pain Medicine | Admitting: Pain Medicine

## 2016-02-29 VITALS — BP 126/64 | HR 59 | Temp 98.5°F | Resp 18 | Ht <= 58 in | Wt 176.0 lb

## 2016-02-29 DIAGNOSIS — M5136 Other intervertebral disc degeneration, lumbar region: Secondary | ICD-10-CM

## 2016-02-29 DIAGNOSIS — M533 Sacrococcygeal disorders, not elsewhere classified: Secondary | ICD-10-CM | POA: Diagnosis not present

## 2016-02-29 DIAGNOSIS — M4806 Spinal stenosis, lumbar region: Secondary | ICD-10-CM | POA: Diagnosis not present

## 2016-02-29 DIAGNOSIS — M5127 Other intervertebral disc displacement, lumbosacral region: Secondary | ICD-10-CM | POA: Diagnosis not present

## 2016-02-29 DIAGNOSIS — M172 Bilateral post-traumatic osteoarthritis of knee: Secondary | ICD-10-CM

## 2016-02-29 DIAGNOSIS — M179 Osteoarthritis of knee, unspecified: Secondary | ICD-10-CM | POA: Diagnosis not present

## 2016-02-29 DIAGNOSIS — M79606 Pain in leg, unspecified: Secondary | ICD-10-CM | POA: Diagnosis present

## 2016-02-29 DIAGNOSIS — M7060 Trochanteric bursitis, unspecified hip: Secondary | ICD-10-CM

## 2016-02-29 DIAGNOSIS — M1610 Unilateral primary osteoarthritis, unspecified hip: Secondary | ICD-10-CM | POA: Diagnosis not present

## 2016-02-29 DIAGNOSIS — M5030 Other cervical disc degeneration, unspecified cervical region: Secondary | ICD-10-CM

## 2016-02-29 DIAGNOSIS — M5126 Other intervertebral disc displacement, lumbar region: Secondary | ICD-10-CM | POA: Diagnosis not present

## 2016-02-29 DIAGNOSIS — M47816 Spondylosis without myelopathy or radiculopathy, lumbar region: Secondary | ICD-10-CM | POA: Insufficient documentation

## 2016-02-29 DIAGNOSIS — M546 Pain in thoracic spine: Secondary | ICD-10-CM | POA: Diagnosis present

## 2016-02-29 DIAGNOSIS — M542 Cervicalgia: Secondary | ICD-10-CM | POA: Diagnosis present

## 2016-02-29 DIAGNOSIS — M48062 Spinal stenosis, lumbar region with neurogenic claudication: Secondary | ICD-10-CM

## 2016-02-29 DIAGNOSIS — M170 Bilateral primary osteoarthritis of knee: Secondary | ICD-10-CM

## 2016-02-29 MED ORDER — OXYCODONE HCL 5 MG PO CAPS: ORAL_CAPSULE | ORAL | Status: DC

## 2016-02-29 MED ORDER — GABAPENTIN 300 MG PO CAPS: ORAL_CAPSULE | ORAL | Status: DC

## 2016-02-29 NOTE — Patient Instructions (Addendum)
PLAN   Continue present medication oxycodone and Neurontin  Lumbosacral selective nerve root block to be performed at time of return appointment  F/U PCP Dr. Lisette Grinder III for evaliation of  BP and general medical  condition.    F/U surgical evaluation. May consider pending follow-up evaluations  F/U neurological evaluation. May consider [facility] EMG studies/ pending follow-up evaluations  May consider radiofrequency rhizolysis or intraspinal procedures pending response to present treatment and F/U evaluation   Patient to call Pain Management Center should patient have concerns prior to scheduled return appointmentGENERAL RISKS AND COMPLICATIONS  What are the risk, side effects and possible complications? Generally speaking, most procedures are safe.  However, with any procedure there are risks, side effects, and the possibility of complications.  The risks and complications are dependent upon the sites that are lesioned, or the type of nerve block to be performed.  The closer the procedure is to the spine, the more serious the risks are.  Great care is taken when placing the radio frequency needles, block needles or lesioning probes, but sometimes complications can occur. 1. Infection: Any time there is an injection through the skin, there is a risk of infection.  This is why sterile conditions are used for these blocks.  There are four possible types of infection. 1. Localized skin infection. 2. Central Nervous System Infection-This can be in the form of Meningitis, which can be deadly. 3. Epidural Infections-This can be in the form of an epidural abscess, which can cause pressure inside of the spine, causing compression of the spinal cord with subsequent paralysis. This would require an emergency surgery to decompress, and there are no guarantees that the patient would recover from the paralysis. 4. Discitis-This is an infection of the intervertebral discs.  It occurs in about 1% of  discography procedures.  It is difficult to treat and it may lead to surgery.        2. Pain: the needles have to go through skin and soft tissues, will cause soreness.       3. Damage to internal structures:  The nerves to be lesioned may be near blood vessels or    other nerves which can be potentially damaged.       4. Bleeding: Bleeding is more common if the patient is taking blood thinners such as  aspirin, Coumadin, Ticiid, Plavix, etc., or if he/she have some genetic predisposition  such as hemophilia. Bleeding into the spinal canal can cause compression of the spinal  cord with subsequent paralysis.  This would require an emergency surgery to  decompress and there are no guarantees that the patient would recover from the  paralysis.       5. Pneumothorax:  Puncturing of a lung is a possibility, every time a needle is introduced in  the area of the chest or upper back.  Pneumothorax refers to free air around the  collapsed lung(s), inside of the thoracic cavity (chest cavity).  Another two possible  complications related to a similar event would include: Hemothorax and Chylothorax.   These are variations of the Pneumothorax, where instead of air around the collapsed  lung(s), you may have blood or chyle, respectively.       6. Spinal headaches: They may occur with any procedures in the area of the spine.       7. Persistent CSF (Cerebro-Spinal Fluid) leakage: This is a rare problem, but may occur  with prolonged intrathecal or epidural catheters either due to the  formation of a fistulous  track or a dural tear.       8. Nerve damage: By working so close to the spinal cord, there is always a possibility of  nerve damage, which could be as serious as a permanent spinal cord injury with  paralysis.       9. Death:  Although rare, severe deadly allergic reactions known as "Anaphylactic  reaction" can occur to any of the medications used.      10. Worsening of the symptoms:  We can always make thing  worse.  What are the chances of something like this happening? Chances of any of this occuring are extremely low.  By statistics, you have more of a chance of getting killed in a motor vehicle accident: while driving to the hospital than any of the above occurring .  Nevertheless, you should be aware that they are possibilities.  In general, it is similar to taking a shower.  Everybody knows that you can slip, hit your head and get killed.  Does that mean that you should not shower again?  Nevertheless always keep in mind that statistics do not mean anything if you happen to be on the wrong side of them.  Even if a procedure has a 1 (one) in a 1,000,000 (million) chance of going wrong, it you happen to be that one..Also, keep in mind that by statistics, you have more of a chance of having something go wrong when taking medications.  Who should not have this procedure? If you are on a blood thinning medication (e.g. Coumadin, Plavix, see list of "Blood Thinners"), or if you have an active infection going on, you should not have the procedure.  If you are taking any blood thinners, please inform your physician.  How should I prepare for this procedure?  Do not eat or drink anything at least six hours prior to the procedure.  Bring a driver with you .  It cannot be a taxi.  Come accompanied by an adult that can drive you back, and that is strong enough to help you if your legs get weak or numb from the local anesthetic.  Take all of your medicines the morning of the procedure with just enough water to swallow them.  If you have diabetes, make sure that you are scheduled to have your procedure done first thing in the morning, whenever possible.  If you have diabetes, take only half of your insulin dose and notify our nurse that you have done so as soon as you arrive at the clinic.  If you are diabetic, but only take blood sugar pills (oral hypoglycemic), then do not take them on the morning of your  procedure.  You may take them after you have had the procedure.  Do not take aspirin or any aspirin-containing medications, at least eleven (11) days prior to the procedure.  They may prolong bleeding.  Wear loose fitting clothing that may be easy to take off and that you would not mind if it got stained with Betadine or blood.  Do not wear any jewelry or perfume  Remove any nail coloring.  It will interfere with some of our monitoring equipment.  NOTE: Remember that this is not meant to be interpreted as a complete list of all possible complications.  Unforeseen problems may occur.  BLOOD THINNERS The following drugs contain aspirin or other products, which can cause increased bleeding during surgery and should not be taken for 2 weeks prior to  and 1 week after surgery.  If you should need take something for relief of minor pain, you may take acetaminophen which is found in Tylenol,m Datril, Anacin-3 and Panadol. It is not blood thinner. The products listed below are.  Do not take any of the products listed below in addition to any listed on your instruction sheet.  A.P.C or A.P.C with Codeine Codeine Phosphate Capsules #3 Ibuprofen Ridaura  ABC compound Congesprin Imuran rimadil  Advil Cope Indocin Robaxisal  Alka-Seltzer Effervescent Pain Reliever and Antacid Coricidin or Coricidin-D  Indomethacin Rufen  Alka-Seltzer plus Cold Medicine Cosprin Ketoprofen S-A-C Tablets  Anacin Analgesic Tablets or Capsules Coumadin Korlgesic Salflex  Anacin Extra Strength Analgesic tablets or capsules CP-2 Tablets Lanoril Salicylate  Anaprox Cuprimine Capsules Levenox Salocol  Anexsia-D Dalteparin Magan Salsalate  Anodynos Darvon compound Magnesium Salicylate Sine-off  Ansaid Dasin Capsules Magsal Sodium Salicylate  Anturane Depen Capsules Marnal Soma  APF Arthritis pain formula Dewitt's Pills Measurin Stanback  Argesic Dia-Gesic Meclofenamic Sulfinpyrazone  Arthritis Bayer Timed Release Aspirin  Diclofenac Meclomen Sulindac  Arthritis pain formula Anacin Dicumarol Medipren Supac  Analgesic (Safety coated) Arthralgen Diffunasal Mefanamic Suprofen  Arthritis Strength Bufferin Dihydrocodeine Mepro Compound Suprol  Arthropan liquid Dopirydamole Methcarbomol with Aspirin Synalgos  ASA tablets/Enseals Disalcid Micrainin Tagament  Ascriptin Doan's Midol Talwin  Ascriptin A/D Dolene Mobidin Tanderil  Ascriptin Extra Strength Dolobid Moblgesic Ticlid  Ascriptin with Codeine Doloprin or Doloprin with Codeine Momentum Tolectin  Asperbuf Duoprin Mono-gesic Trendar  Aspergum Duradyne Motrin or Motrin IB Triminicin  Aspirin plain, buffered or enteric coated Durasal Myochrisine Trigesic  Aspirin Suppositories Easprin Nalfon Trillsate  Aspirin with Codeine Ecotrin Regular or Extra Strength Naprosyn Uracel  Atromid-S Efficin Naproxen Ursinus  Auranofin Capsules Elmiron Neocylate Vanquish  Axotal Emagrin Norgesic Verin  Azathioprine Empirin or Empirin with Codeine Normiflo Vitamin E  Azolid Emprazil Nuprin Voltaren  Bayer Aspirin plain, buffered or children's or timed BC Tablets or powders Encaprin Orgaran Warfarin Sodium  Buff-a-Comp Enoxaparin Orudis Zorpin  Buff-a-Comp with Codeine Equegesic Os-Cal-Gesic   Buffaprin Excedrin plain, buffered or Extra Strength Oxalid   Bufferin Arthritis Strength Feldene Oxphenbutazone   Bufferin plain or Extra Strength Feldene Capsules Oxycodone with Aspirin   Bufferin with Codeine Fenoprofen Fenoprofen Pabalate or Pabalate-SF   Buffets II Flogesic Panagesic   Buffinol plain or Extra Strength Florinal or Florinal with Codeine Panwarfarin   Buf-Tabs Flurbiprofen Penicillamine   Butalbital Compound Four-way cold tablets Penicillin   Butazolidin Fragmin Pepto-Bismol   Carbenicillin Geminisyn Percodan   Carna Arthritis Reliever Geopen Persantine   Carprofen Gold's salt Persistin   Chloramphenicol Goody's Phenylbutazone   Chloromycetin Haltrain Piroxlcam    Clmetidine heparin Plaquenil   Cllnoril Hyco-pap Ponstel   Clofibrate Hydroxy chloroquine Propoxyphen         Before stopping any of these medications, be sure to consult the physician who ordered them.  Some, such as Coumadin (Warfarin) are ordered to prevent or treat serious conditions such as "deep thrombosis", "pumonary embolisms", and other heart problems.  The amount of time that you may need off of the medication may also vary with the medication and the reason for which you were taking it.  If you are taking any of these medications, please make sure you notify your pain physician before you undergo any procedures.         Lumbar Sympathetic Block Patient Information  Description: The lumbar plexus is a group of nerves that are part of the sympathetic nervous system.  These nerves supply organs  in the pelvis and legs.  Lumbar sympathetic blocks are utilized for the diagnosis and treatment of painful conditions in these areas.   The lumbar plexus is located on both sides of the aorta at approximately the level of the second lumbar vertebral body.  The block will be performed with you lying on your abdomen with a pillow underneath.  Using direct x-ray guidance,   The plexus will be located on both sides of the spine.  Numbing medicine will be used to deaden the skin prior to needle insertion.  In most cases, a small amount of sedation can be give by IV prior to the numbing medicine.  One or two small needles will be placed near the plexus and local anesthetic will be injected.  This may make your leg(s) feel warm.  The Entire block usually lasts about 15-25 minutes.  Conditions which may be treated by lumbar sympathetic block:   Reflex sympathetic dystrophy  Phantom limb pain  Peripheral neuropathy  Peripheral vascular disease ( inadequate blood flow )  Cancer pain of pelvis, leg and kidney  Preparation for the injection:  1. Do note eat any solid food or diary products  within 8 hours of your appointment. 2. You may drink clear liquids up to 3 hours before appointment.  Clear liquids include water, black coffee, juice or soda.  No milk or cream please. 3. You may take your regular medication, including pain medications, with a sip of water before you appointment.  Diabetics should hold regular insulin ( if taken separately ) and take 1/2 NPH dose the morning of the procedure .  Carry some sugar containing items with you to your appointment. 4. A driver must accompany you and be prepared to drive you home after your procedure. 5. Bring all your current medication with you. 6. An IV may be inserted and sedation may be given at the discretion of the physician.  7. A blood pressure cuff, EKG and other monitors will often be applied during the procedure.  Some patients may need to have extra oxygen administered for a short period. 8. You will be asked to provide medical information, including your allergies and medications, prior to the procedure.  We must know immediately if your taking blood thinners (like Coumadin/Warfarin) or if you are allergic to IV iodine contrast (dye).  We must know if you could possibly be pregnant.  Possible side-effects   Bleeding from needle site or deeper  Infection (rare, can require surgery)  Nerve injury (rare)  Numbness & tingling (temporary)  Collapsed lung (rare)  Spinal headache (a headache worse with upright posture)  Light-headedness (temporary)  Pain at injection site (several days)  Decreased blood pressure (temporary)  Weakness in legs (temporary)  Seizure or other drug reaction (rare)  Call if you experience:   Fever/chills associated with headache or increased back/ neck pain  Headache worsened by an upright position  New onset weakness or numbness of an extremity below the injection site  Hives or difficulty breathing ( go to the emergency room)  Inflammation or drainage at the injections  site(s)  New symptoms which are concerning to you  Please note:  If effective, we will often do a series of 2-3 injections spaced 3-6 weeks apart to maximally decrease your pain.  If initial series is effective, you may be a candidate for a more permanent block of the lumbar sympathetic plexus.  If you have any questions please call [phone] [facility]

## 2016-02-29 NOTE — Progress Notes (Signed)
Safety precautions to be maintained throughout the outpatient stay will include: orient to surroundings, keep bed in low position, maintain call bell within reach at all times, provide assistance with transfer out of bed and ambulation.  

## 2016-02-29 NOTE — Progress Notes (Signed)
   Subjective:    Patient ID: [name], female    DOB: Jul 05, 1946, 70 y.o.   MRN: [id number]  HPI  The patient is a 70 year old female who returns to pain management for further evaluation and treatment of pain involving the region of the neck entire back upper and lower extremity regions. The patient is with pain involving the lower back and lower extremity region predominantly and has had some improvement with interventional treatment. The patient has also undergone surgical evaluation without recommendations for surgical intervention. Patient's condition as well as general medical condition patient not felt to be surgical candidate. We discussed patient's condition and patient continues Neurontin and Robaxin at this time. The patient denies any undesirable side effects due to medications. The patient states the pain is quite severe involving the lower back lower extremity region on the right. We will proceed with interventional treatment at time return appointment consisting of lumbosacral selective nerve root block. The patient was with understanding and agreed to suggested treatment plan.  Review of Systems     Objective:   Physical Exam  There was tends to palpation of paraspinal misreading cervical region cervical facet region palpation which reproduces pain of moderate degree. There was moderate tenderness of the splenius capitis and occipitalis musculature regions. Palpation of the acromial clavicular and glenohumeral joint region was attends to palpation of moderate degree and patient was with unremarkable Spurling's maneuver. Tinel and Phalen's maneuver were without increased pain of significant degree. Palpation over the thoracic region thoracic facet region was attends to palpation with evidence of moderate muscle spasms involving the lower thoracic region with no crepitus of the thoracic region noted. Palpation over the lumbar paraspinal must reason lumbar facet region was attends to  palpation of severe degree with lateral bending rotation extension and palpation of the lumbar facets reproducing severe discomfort on the right compared to the left. There was tenderness over the PSIS and PII S region a moderate degree. There was moderate tenderness of the greater trochanteric region iliotibial band region as well. The knees were attends to palpation of mild degree. EHL strength appeared to be decreased. There was question decreased sensation of the L5 dermatomal distribution detected. There was negative clonus negative Homans. Abdomen was nontender with no costovertebral tenderness noted.      Assessment & Plan:     Denerative disc disease lumbar spine Multilevel degenerative changes lumbar spine, annular disc bulging L4-L5, central disc protrusion L5-S1, L3-4 prominent flattening of the lateral recess and left paracentral disc protrusion  Lumbar stenosis with neurogenic claudication  Lumbar facet syndrome  Sacroiliac joint dysfunction  Degenerative joint disease of knee  Degenerative joint disease of hip      PLAN   Continue present medication oxycodone and Neurontin  Lumbosacral selective nerve root block to be performed at time of return appointment  F/U PCP Dr. Lisette Grinder III for evaliation of  BP and general medical  condition.    F/U surgical evaluation. May consider pending follow-up evaluations  F/U neurological evaluation. May consider [facility] EMG studies/ pending follow-up evaluations  May consider radiofrequency rhizolysis or intraspinal procedures pending response to present treatment and F/U evaluation   Patient to call Pain Management Center should patient have concerns prior to scheduled return appointment

## 2016-03-04 LAB — TOXASSURE SELECT 13 (MW), URINE

## 2016-03-06 NOTE — Progress Notes (Signed)
Quick Note:  Reviewed. ______ 

## 2016-03-08 ENCOUNTER — Encounter: Payer: Self-pay | Admitting: Pain Medicine

## 2016-03-08 ENCOUNTER — Ambulatory Visit: Payer: Medicare Other | Attending: Pain Medicine | Admitting: Pain Medicine

## 2016-03-08 VITALS — BP 135/67 | HR 59 | Temp 98.2°F | Resp 15 | Ht <= 58 in | Wt 175.0 lb

## 2016-03-08 DIAGNOSIS — M5126 Other intervertebral disc displacement, lumbar region: Secondary | ICD-10-CM | POA: Diagnosis not present

## 2016-03-08 DIAGNOSIS — M172 Bilateral post-traumatic osteoarthritis of knee: Secondary | ICD-10-CM

## 2016-03-08 DIAGNOSIS — M47816 Spondylosis without myelopathy or radiculopathy, lumbar region: Secondary | ICD-10-CM | POA: Diagnosis not present

## 2016-03-08 DIAGNOSIS — M79606 Pain in leg, unspecified: Secondary | ICD-10-CM | POA: Diagnosis present

## 2016-03-08 DIAGNOSIS — M170 Bilateral primary osteoarthritis of knee: Secondary | ICD-10-CM

## 2016-03-08 DIAGNOSIS — M533 Sacrococcygeal disorders, not elsewhere classified: Secondary | ICD-10-CM

## 2016-03-08 DIAGNOSIS — M5136 Other intervertebral disc degeneration, lumbar region: Secondary | ICD-10-CM

## 2016-03-08 DIAGNOSIS — M48062 Spinal stenosis, lumbar region with neurogenic claudication: Secondary | ICD-10-CM

## 2016-03-08 DIAGNOSIS — M7060 Trochanteric bursitis, unspecified hip: Secondary | ICD-10-CM

## 2016-03-08 DIAGNOSIS — M545 Low back pain: Secondary | ICD-10-CM | POA: Diagnosis present

## 2016-03-08 DIAGNOSIS — M5030 Other cervical disc degeneration, unspecified cervical region: Secondary | ICD-10-CM

## 2016-03-08 MED ORDER — CEFUROXIME AXETIL 250 MG PO TABS: 250.0000 mg | ORAL_TABLET | Freq: Two times a day (BID) | ORAL | Status: DC

## 2016-03-08 MED ORDER — ORPHENADRINE CITRATE 30 MG/ML IJ SOLN: 60.0000 mg | Freq: Once | INTRAMUSCULAR | Status: DC

## 2016-03-08 MED ORDER — CEFAZOLIN SODIUM 1 G IJ SOLR
INTRAMUSCULAR | Status: AC
  Administered 2016-03-08: 1 g
  Filled 2016-03-08: qty 10

## 2016-03-08 MED ORDER — BUPIVACAINE HCL (PF) 0.25 % IJ SOLN: 30.0000 mL | Freq: Once | INTRAMUSCULAR | Status: DC

## 2016-03-08 MED ORDER — MIDAZOLAM HCL 5 MG/5ML IJ SOLN: 5.0000 mg | Freq: Once | INTRAMUSCULAR | Status: DC

## 2016-03-08 MED ORDER — MIDAZOLAM HCL 5 MG/5ML IJ SOLN
INTRAMUSCULAR | Status: AC
  Administered 2016-03-08: 3 mg via INTRAVENOUS
  Filled 2016-03-08: qty 5

## 2016-03-08 MED ORDER — LACTATED RINGERS IV SOLN: 1000.0000 mL | INTRAVENOUS | Status: DC

## 2016-03-08 MED ORDER — TRIAMCINOLONE ACETONIDE 40 MG/ML IJ SUSP: 40.0000 mg | Freq: Once | INTRAMUSCULAR | Status: DC

## 2016-03-08 MED ORDER — LIDOCAINE HCL (PF) 1 % IJ SOLN: 10.0000 mL | Freq: Once | INTRAMUSCULAR | Status: DC

## 2016-03-08 MED ORDER — FENTANYL CITRATE (PF) 100 MCG/2ML IJ SOLN: 100.0000 ug | Freq: Once | INTRAMUSCULAR | Status: DC

## 2016-03-08 MED ORDER — ORPHENADRINE CITRATE 30 MG/ML IJ SOLN
INTRAMUSCULAR | Status: AC
  Filled 2016-03-08: qty 2

## 2016-03-08 MED ORDER — CEFAZOLIN SODIUM 1-5 GM-% IV SOLN: 1.0000 g | Freq: Once | INTRAVENOUS | Status: DC

## 2016-03-08 MED ORDER — TRIAMCINOLONE ACETONIDE 40 MG/ML IJ SUSP
INTRAMUSCULAR | Status: AC
  Administered 2016-03-08: 10:00:00
  Filled 2016-03-08: qty 1

## 2016-03-08 MED ORDER — BUPIVACAINE HCL (PF) 0.25 % IJ SOLN
INTRAMUSCULAR | Status: AC
  Administered 2016-03-08: 10:00:00
  Filled 2016-03-08: qty 30

## 2016-03-08 MED ORDER — FENTANYL CITRATE (PF) 100 MCG/2ML IJ SOLN
INTRAMUSCULAR | Status: AC
  Administered 2016-03-08: 50 ug via INTRAVENOUS
  Filled 2016-03-08: qty 2

## 2016-03-08 NOTE — Patient Instructions (Addendum)
PLAN   Continue present medication Neurontin and oxycodone. Please obtain Ceftin antibiotic today and begin taking Ceftin antibiotic today as prescribed  F/U PCP Dr. Lisette Grinder III for evaliation of  BP and general medical  condition  F/U surgical evaluation. May consider pending follow-up evaluations  F/U neurological evaluation. May consider pending follow-up evaluations  May consider radiofrequency rhizolysis or intraspinal procedures pending response to present treatment and F/U evaluation  Patient to call Pain Management Center should patient have concerns prior to scheduled return appointmentPain Management Discharge Instructions  General Discharge Instructions :  If you need to reach your doctor call: Monday-Friday 8:00 am - 4:00 pm at [phone] or toll free [phone].  After clinic hours [phone] to have operator reach doctor.  Bring all of your medication bottles to all your appointments in the pain clinic.  To cancel or reschedule your appointment with Pain Management please remember to call 24 hours in advance to avoid a fee.  Refer to the educational materials which you have been given on: General Risks, I had my Procedure. Discharge Instructions, Post Sedation.  Post Procedure Instructions:  The drugs you were given will stay in your system until tomorrow, so for the next 24 hours you should not drive, make any legal decisions or drink any alcoholic beverages.  You may eat anything you prefer, but it is better to start with liquids then soups and crackers, and gradually work up to solid foods.  Please notify your doctor immediately if you have any unusual bleeding, trouble breathing or pain that is not related to your normal pain.  Depending on the type of procedure that was done, some parts of your body may feel week and/or numb.  This usually clears up by tonight or the next day.  Walk with the use of an assistive device or accompanied by an adult for  the 24 hours.  You may use ice on the affected area for the first 24 hours.  Put ice in a Ziploc bag and cover with a towel and place against area 15 minutes on 15 minutes off.  You may switch to heat after 24 hours.

## 2016-03-08 NOTE — Progress Notes (Signed)
Subjective:    Patient ID: [name], female    DOB: 10-06-1946, 70 y.o.   MRN: [id number]  HPI  PROCEDURE PERFORMED: Lumbosacral selective nerve root block   NOTE: The patient is a 70 y.o. female who returns to [facility] for further evaluation and treatment of pain involving the lumbar and lower extremity region. Studies consisting of MRI has revealed the patient to be with evidence of denerative disc disease lumbar spine with multilevel degenerative changes lumbar spine, annular disc bulging L4-L5, central disc protrusion L5-S1, L3-4 prominent flattening of the lateral recess and left paracentral disc protrusion. There is concern regarding intraspinal abnormalities contributing to the patient's symptomatology. . There is concern regarding significant component of patient's pain being due to lumbar radiculopathy The risks, benefits, and expectations of the procedure have been explained to the patient who was understanding and in agreement with suggested treatment plan. We will proceed with interventional treatment as discussed and as explained to the patient. The patient is understanding and in agreement with suggested treatment plan.   DESCRIPTION OF PROCEDURE: Lumbosacral selective nerve root block with IV Versed, IV fentanyl conscious sedation, EKG, blood pressure, pulse, capnography, and pulse oximetry monitoring. The procedure was performed with the patient in the prone position under fluoroscopic guidance. With the patient in the prone position, Betadine prep of proposed entry site was performed. Local anesthetic skin wheal of proposed needle entry site was prepared with 1.5% plain lidocaine with AP view of the lumbosacral spine.   PROCEDURE #1: Needle placement at the right L 2 vertebral body: A 22 -gauge needle was inserted at the inferior border of the transverse process of the vertebral body with needle placed medial to the midline of the transverse process on AP view of  the lumbosacral spine.   NEEDLE PLACEMENT AT  L3, L4, and L5  VERTEBRAL BODY LEVELS  Needle  placement was accomplished at L3, L4, and L5  vertebral body levels on the right side exactly as was accomplished at the L2  vertebral body level  and utilizing the same technique and under fluoroscopic guidance.    Needle placement was then verified on lateral view at all levels with needle tip documented to be in the posterior superior quadrant of the intervertebral foramen of  L 2, L3, L4, and L5. Following negative aspiration for heme and CSF at each level, each level was injected with 3 mL of 0.25% bupivacaine with Kenalog.     The patient tolerated the procedure well. A total of 10 mg of Kenalog was utilized for the procedure.   PLAN:  1. Medications: Will continue presently prescribed medications Neurontin and oxycodone 2. The patient is to undergo follow-up evaluation with PCP Dr. Lisette Grinder  III for evaluation of blood pressure and general medical condition status post procedure performed on today's visit. 3. Surgical follow-up evaluation. Has been addressed. Patient is without plans for surgical intervention 4. Neurological evaluation. May consider [facility] EMG studies and other studies 5. May consider radiofrequency procedures, implantation type procedures and other treatment pending response to treatment and follow-up evaluation. 6. The patient has been advise do adhere to proper body mechanics and avoid activities which may aggravate condition. 7. The patient has been advised to call the Pain Management Center prior to scheduled return appointment should there be significant change in the patient's condition or should the patient have other concerns regarding condition prior to scheduled return appointment.   Review of Systems     Objective:  Physical Exam        Assessment & Plan:

## 2016-03-08 NOTE — Progress Notes (Signed)
Safety precautions to be maintained throughout the outpatient stay will include: orient to surroundings, keep bed in low position, maintain call bell within reach at all times, provide assistance with transfer out of bed and ambulation.  

## 2016-03-28 ENCOUNTER — Telehealth: Payer: Self-pay | Admitting: *Deleted

## 2016-03-28 NOTE — Telephone Encounter (Signed)
lvm made pt aware that I returned her call. i asked the pt to call me back so that we can request her appt for 03/30/16@ 9:15am per her request..Marland KitchenTD

## 2016-03-30 ENCOUNTER — Ambulatory Visit: Payer: Medicare Other | Admitting: Pain Medicine

## 2016-03-31 DIAGNOSIS — J189 Pneumonia, unspecified organism: Secondary | ICD-10-CM

## 2016-03-31 HISTORY — DX: Pneumonia, unspecified organism: J18.9

## 2016-04-05 ENCOUNTER — Ambulatory Visit: Payer: Medicare Other | Attending: Pain Medicine | Admitting: Pain Medicine

## 2016-04-05 ENCOUNTER — Encounter: Payer: Self-pay | Admitting: Pain Medicine

## 2016-04-05 VITALS — BP 137/76 | HR 76 | Temp 98.6°F | Resp 16 | Ht <= 58 in | Wt 176.0 lb

## 2016-04-05 DIAGNOSIS — M5126 Other intervertebral disc displacement, lumbar region: Secondary | ICD-10-CM | POA: Diagnosis not present

## 2016-04-05 DIAGNOSIS — M546 Pain in thoracic spine: Secondary | ICD-10-CM | POA: Diagnosis present

## 2016-04-05 DIAGNOSIS — M79606 Pain in leg, unspecified: Secondary | ICD-10-CM | POA: Diagnosis present

## 2016-04-05 DIAGNOSIS — M4806 Spinal stenosis, lumbar region: Secondary | ICD-10-CM | POA: Insufficient documentation

## 2016-04-05 DIAGNOSIS — M48062 Spinal stenosis, lumbar region with neurogenic claudication: Secondary | ICD-10-CM

## 2016-04-05 DIAGNOSIS — M5136 Other intervertebral disc degeneration, lumbar region: Secondary | ICD-10-CM | POA: Insufficient documentation

## 2016-04-05 DIAGNOSIS — M7060 Trochanteric bursitis, unspecified hip: Secondary | ICD-10-CM

## 2016-04-05 DIAGNOSIS — M1610 Unilateral primary osteoarthritis, unspecified hip: Secondary | ICD-10-CM | POA: Insufficient documentation

## 2016-04-05 DIAGNOSIS — M5030 Other cervical disc degeneration, unspecified cervical region: Secondary | ICD-10-CM

## 2016-04-05 DIAGNOSIS — M179 Osteoarthritis of knee, unspecified: Secondary | ICD-10-CM | POA: Insufficient documentation

## 2016-04-05 DIAGNOSIS — M533 Sacrococcygeal disorders, not elsewhere classified: Secondary | ICD-10-CM | POA: Diagnosis not present

## 2016-04-05 DIAGNOSIS — M170 Bilateral primary osteoarthritis of knee: Secondary | ICD-10-CM

## 2016-04-05 DIAGNOSIS — M47816 Spondylosis without myelopathy or radiculopathy, lumbar region: Secondary | ICD-10-CM

## 2016-04-05 DIAGNOSIS — M542 Cervicalgia: Secondary | ICD-10-CM | POA: Diagnosis present

## 2016-04-05 DIAGNOSIS — M172 Bilateral post-traumatic osteoarthritis of knee: Secondary | ICD-10-CM

## 2016-04-05 DIAGNOSIS — M51369 Other intervertebral disc degeneration, lumbar region without mention of lumbar back pain or lower extremity pain: Secondary | ICD-10-CM

## 2016-04-05 MED ORDER — GABAPENTIN 300 MG PO CAPS: ORAL_CAPSULE | ORAL | Status: DC

## 2016-04-05 MED ORDER — OXYCODONE HCL 5 MG PO CAPS: ORAL_CAPSULE | ORAL | Status: DC

## 2016-04-05 NOTE — Patient Instructions (Addendum)
PLAN   Continue present medication oxycodone and Neurontin  F/U PCP  Dr Lisette Grinder III for evaliation of  BP, pneumonia,  and general medical  Condition As discussed  F/U surgical evaluation. May consider pending follow-up evaluations  F/U neurological evaluation. May consider [facility] EMG studies/ pending follow-up evaluations  May consider radiofrequency rhizolysis or intraspinal procedures pending response to present treatment and F/U evaluation   Patient to call Pain Management Center should patient have concerns prior to scheduled return appointment

## 2016-04-05 NOTE — Progress Notes (Signed)
   Subjective:    Patient ID: [name], female    DOB: [birth year], 70 y.o.   MRN: [id number]  HPI  The patient is a 70 year old female who returns to pain management for further evaluation and treatment of pain involving neck entire back upper and lower extremity regions. The patient has had significant improvement of pain with prior interventional treatment performed in pain management Center. Patient states the predominant pain present time the lower back region toward the buttocks on the right with pain continuing distally towards the lower extremity. The patient has been recently treated for pneumonia and will continue under the care of Dr. Lisette Grinder III for evaluation of pneumonia and general medical condition. We will avoid additional interventional treatment at this time. The patient states the pain is aggravated by standing walking twisting turning maneuvers with pain becoming more intense as the day progresses. We remain available to consider additional modifications of treatment regimen pending follow-up evaluation at the present time patient will continue Neurontin and oxycodone. All agreed with suggested treatment plan.  Review of Systems     Objective:   Physical Exam  There was tends to palpation of the splenius capitis and occipitalis regions palpation which reproduces pain of mild to moderate degree with moderate tenderness over the cervical facet cervical paraspinal musculature region. There was moderate tends to palpation of the acromioclavicular and glenohumeral joint regions. The patient was with slightly decreased grip strength with Tinel and Phalen's maneuver reproducing mild discomfort. Palpation over the region of the thoracic region was attends to palpation with kyphoscoliosis of the thoracic oh lumbar spine noted with significant muscle spasms involving the mid and lower thoracic paraspinal musculature region. There was moderate to moderately severe tenderness to  palpation of the PSIS and PII S region as well as mild tenderness of the greater trochanteric region iliotibial band region. These without a definite increased pain with dorsiflexion noted. There was questionably decreased sensation of the L5 dermatomal distribution. There was negative clonus negative Homans. Abdomen was nontender with no costovertebral tenderness noted      Assessment & Plan:       Denerative disc disease lumbar spine Multilevel degenerative changes lumbar spine, annular disc bulging L4-L5, central disc protrusion L5-S1, L3-4 prominent flattening of the lateral recess and left paracentral disc protrusion  Lumbar stenosis with neurogenic claudication  Lumbar facet syndrome  Sacroiliac joint dysfunction  Degenerative joint disease of knee  Degenerative joint disease of hip      PLAN   Continue present medications oxycodone and Neurontin  F/U PCP  Dr Lisette Grinder III for evaliation of  BP, pneumonia,  and general medical  Condition As discussed  F/U surgical evaluation. May consider pending follow-up evaluations  F/U neurological evaluation. May consider [facility] EMG studies/ pending follow-up evaluations  May consider radiofrequency rhizolysis or intraspinal procedures pending response to present treatment and F/U evaluation   Patient to call Pain Management Center should patient have concerns prior to scheduled return appointment

## 2016-04-05 NOTE — Progress Notes (Signed)
Patient here for medication management.  Patient is s/p being dx with pneumonia for which she is being treated by her PCP.  Patient also reports that she can get her gabapentin through [facility] with no copay.  PCP wrote for this and she has filled through the [facility] and does not need a refill today.  Safety precautions to be maintained throughout the outpatient stay will include: orient to surroundings, keep bed in low position, maintain call bell within reach at all times, provide assistance with transfer out of bed and ambulation.

## 2016-04-18 ENCOUNTER — Telehealth: Payer: Self-pay | Admitting: Pain Medicine

## 2016-04-18 NOTE — Telephone Encounter (Signed)
Having increased pain and not able to deal with it very well, please call

## 2016-04-19 NOTE — Telephone Encounter (Signed)
Pain in right lower back, down right hip and right leg to the knee. "Pain is as bad as it's ever been." No new injury. Dr.Crisp, please advise.

## 2016-04-20 ENCOUNTER — Other Ambulatory Visit: Payer: Self-pay | Admitting: Pain Medicine

## 2016-04-20 DIAGNOSIS — M172 Bilateral post-traumatic osteoarthritis of knee: Secondary | ICD-10-CM

## 2016-04-20 DIAGNOSIS — M5136 Other intervertebral disc degeneration, lumbar region: Secondary | ICD-10-CM

## 2016-04-20 DIAGNOSIS — M48062 Spinal stenosis, lumbar region with neurogenic claudication: Secondary | ICD-10-CM

## 2016-04-20 DIAGNOSIS — M5030 Other cervical disc degeneration, unspecified cervical region: Secondary | ICD-10-CM

## 2016-04-20 DIAGNOSIS — M170 Bilateral primary osteoarthritis of knee: Secondary | ICD-10-CM

## 2016-04-20 DIAGNOSIS — M7060 Trochanteric bursitis, unspecified hip: Secondary | ICD-10-CM

## 2016-04-20 DIAGNOSIS — M47816 Spondylosis without myelopathy or radiculopathy, lumbar region: Secondary | ICD-10-CM

## 2016-04-20 DIAGNOSIS — M51369 Other intervertebral disc degeneration, lumbar region without mention of lumbar back pain or lower extremity pain: Secondary | ICD-10-CM

## 2016-04-20 DIAGNOSIS — M533 Sacrococcygeal disorders, not elsewhere classified: Secondary | ICD-10-CM

## 2016-04-20 NOTE — Telephone Encounter (Signed)
Nurses and Secretaries, Please have patient go to emergency department for evaluation and please schedule neurosurgical reevaluation at this time

## 2016-04-20 NOTE — Telephone Encounter (Signed)
Patient not at home when I returned call. She was taking another friend to her Dr. Network engineer. Instructed family member to have patient go to ED/PCP about her hip and back pain. He states he will tell her when she returns home today.

## 2016-04-20 NOTE — Telephone Encounter (Signed)
Thank you very much 

## 2016-05-04 ENCOUNTER — Ambulatory Visit: Payer: Medicare Other | Admitting: Pain Medicine

## 2016-05-05 ENCOUNTER — Ambulatory Visit: Payer: Medicare Other | Attending: Pain Medicine | Admitting: Pain Medicine

## 2016-05-05 ENCOUNTER — Telehealth: Payer: Self-pay | Admitting: *Deleted

## 2016-05-05 ENCOUNTER — Encounter: Payer: Self-pay | Admitting: Pain Medicine

## 2016-05-05 VITALS — BP 123/74 | HR 70 | Temp 98.7°F | Resp 18 | Ht <= 58 in | Wt 176.0 lb

## 2016-05-05 DIAGNOSIS — M5030 Other cervical disc degeneration, unspecified cervical region: Secondary | ICD-10-CM

## 2016-05-05 DIAGNOSIS — M5126 Other intervertebral disc displacement, lumbar region: Secondary | ICD-10-CM | POA: Diagnosis not present

## 2016-05-05 DIAGNOSIS — M179 Osteoarthritis of knee, unspecified: Secondary | ICD-10-CM | POA: Diagnosis not present

## 2016-05-05 DIAGNOSIS — M1610 Unilateral primary osteoarthritis, unspecified hip: Secondary | ICD-10-CM | POA: Diagnosis not present

## 2016-05-05 DIAGNOSIS — M546 Pain in thoracic spine: Secondary | ICD-10-CM | POA: Diagnosis present

## 2016-05-05 DIAGNOSIS — M47816 Spondylosis without myelopathy or radiculopathy, lumbar region: Secondary | ICD-10-CM

## 2016-05-05 DIAGNOSIS — M533 Sacrococcygeal disorders, not elsewhere classified: Secondary | ICD-10-CM | POA: Diagnosis not present

## 2016-05-05 DIAGNOSIS — M4806 Spinal stenosis, lumbar region: Secondary | ICD-10-CM | POA: Diagnosis not present

## 2016-05-05 DIAGNOSIS — M542 Cervicalgia: Secondary | ICD-10-CM | POA: Diagnosis present

## 2016-05-05 DIAGNOSIS — M7060 Trochanteric bursitis, unspecified hip: Secondary | ICD-10-CM

## 2016-05-05 DIAGNOSIS — M172 Bilateral post-traumatic osteoarthritis of knee: Secondary | ICD-10-CM

## 2016-05-05 DIAGNOSIS — M5136 Other intervertebral disc degeneration, lumbar region: Secondary | ICD-10-CM

## 2016-05-05 DIAGNOSIS — M51369 Other intervertebral disc degeneration, lumbar region without mention of lumbar back pain or lower extremity pain: Secondary | ICD-10-CM

## 2016-05-05 DIAGNOSIS — M170 Bilateral primary osteoarthritis of knee: Secondary | ICD-10-CM

## 2016-05-05 DIAGNOSIS — M5127 Other intervertebral disc displacement, lumbosacral region: Secondary | ICD-10-CM | POA: Diagnosis not present

## 2016-05-05 DIAGNOSIS — M5116 Intervertebral disc disorders with radiculopathy, lumbar region: Secondary | ICD-10-CM | POA: Diagnosis not present

## 2016-05-05 DIAGNOSIS — M48062 Spinal stenosis, lumbar region with neurogenic claudication: Secondary | ICD-10-CM

## 2016-05-05 MED ORDER — OXYCODONE HCL 5 MG PO CAPS: ORAL_CAPSULE | ORAL | Status: DC

## 2016-05-05 MED ORDER — GABAPENTIN 300 MG PO CAPS: ORAL_CAPSULE | ORAL | Status: DC

## 2016-05-05 NOTE — Progress Notes (Signed)
   Subjective:    Patient ID: [name], female    DOB: 29-Aug-1946, 70 y.o.   MRN: [id number]  HPI  Patient is a 70 year old female who returns to pain management for further evaluation and treatment of pain involving the neck entire back upper and lower extremity regions. The patient states that she does have significant pain radiating from the lumbar region to the buttocks on the left as well as on the right. The patient's pain is aggravated by standing walking and becomes more intense as the day progresses. The patient is without recent trauma change in events of daily living the call significant change in symptomatology. The patient continues medications consisting of Neurontin and oxycodone. We will proceed with block of nerves to the sacroiliac joint at time return appointment and will continue Neurontin and oxycodone as prescribed. All agreed to suggested treatment plan  Review of Systems     Objective:   Physical Exam  There was tenderness of the splenius capitis and occipitalis region a moderate degree with tenderness of the acromioclavicular and glenohumeral joint region a moderate degree with patient appeared to be with bilaterally equal grip strength. There was tenderness over the region of the thoracic facet thoracic paraspinal musculature region with moderate muscle spasms noted throughout the thoracic region with no crepitus of the thoracic region noted. There was tenderness over the lumbar facet lumbar paraspinal musculature region with lateral bending rotation extension and palpation of the lumbar facets reproducing moderate discomfort. EHL strength was decreased and no definite sensory deficit or dermatomal distribution was detected. There was negative clonus negative Homans. Abdomen nontender with no costovertebral tenderness noted palpation over the PSIS and PII S region reproduced predominant portion of patient's pain. The patient was with moderate tenderness of the greater  trochanteric region iliotibial band region with predominant pain reproduced with palpation of the PSIS and PII S region      Assessment & Plan:      Denerative disc disease lumbar spine Multilevel degenerative changes lumbar spine, annular disc bulging L4-L5, central disc protrusion L5-S1, L3-4 prominent flattening of the lateral recess and left paracentral disc protrusion  Lumbar stenosis with neurogenic claudication  Lumbar facet syndrome  Sacroiliac joint dysfunction  Degenerative joint disease of knee  Degenerative joint disease of hip     PLAN   Continue present medication oxycodone and Neurontin  Block of nerves to the sacroiliac joint to be performed at time of return appointment  F/U PCP  Dr Lisette Grinder III for evaliation of  BP  and general medical condition  F/U surgical evaluation. May consider pending follow-up evaluations  F/U neurological evaluation. May consider [facility] EMG studies/ pending follow-up evaluations  May consider radiofrequency rhizolysis or intraspinal procedures pending response to present treatment and F/U evaluation   Patient to call Pain Management Center should patient have concerns prior to scheduled return appointment

## 2016-05-05 NOTE — Patient Instructions (Addendum)
PLAN   Continue present medication oxycodone and Neurontin  Block of nerves to the sacroiliac joint to be performed at time of return appointment  F/U PCP  Dr Lisette Grinder III for evaliation of  BP  and general medical condition  F/U surgical evaluation. May consider pending follow-up evaluations  F/U neurological evaluation. May consider [facility] EMG studies/ pending follow-up evaluations  May consider radiofrequency rhizolysis or intraspinal procedures pending response to present treatment and F/U evaluation   Patient to call Pain Management Center should patient have concerns prior to scheduled return appointmentSacroiliac (SI) Joint Injection Patient Information  Description: The sacroiliac joint connects the scrum (very low back and tailbone) to the ilium (a pelvic bone which also forms half of the hip joint).  Normally this joint experiences very little motion.  When this joint becomes inflamed or unstable low back and or hip and pelvis pain may result.  Injection of this joint with local anesthetics (numbing medicines) and steroids can provide diagnostic information and reduce pain.  This injection is performed with the aid of x-ray guidance into the tailbone area while you are lying on your stomach.   You may experience an electrical sensation down the leg while this is being done.  You may also experience numbness.  We also may ask if we are reproducing your normal pain during the injection.  Conditions which may be treated SI injection:   Low back, buttock, hip or leg pain  Preparation for the Injection:  1. Do not eat any solid food or dairy products within 8 hours of your appointment.  2. You may drink clear liquids up to 3 hours before appointment.  Clear liquids include water, black coffee, juice or soda.  No milk or cream please. 3. You may take your regular medications, including pain medications with a sip of water before your appointment.  Diabetics should hold regular  insulin (if take separately) and take 1/2 normal NPH dose the morning of the procedure.  Carry some sugar containing items with you to your appointment. 4. A driver must accompany you and be prepared to drive you home after your procedure. 5. Bring all of your current medications with you. 6. An IV may be inserted and sedation may be given at the discretion of the physician. 7. A blood pressure cuff, EKG and other monitors will often be applied during the procedure.  Some patients may need to have extra oxygen administered for a short period.  8. You will be asked to provide medical information, including your allergies, prior to the procedure.  We must know immediately if you are taking blood thinners (like Coumadin/Warfarin) or if you are allergic to IV iodine contrast (dye).  We must know if you could possible be pregnant.  Possible side effects:   Bleeding from needle site  Infection (rare, may require surgery)  Nerve injury (rare)  Numbness & tingling (temporary)  A brief convulsion or seizure  Light-headedness (temporary)  Pain at injection site (several days)  Decreased blood pressure (temporary)  Weakness in the leg (temporary)   Call if you experience:   New onset weakness or numbness of an extremity below the injection site that last more than 8 hours.  Hives or difficulty breathing ( go to the emergency room)  Inflammation or drainage at the injection site  Any new symptoms which are concerning to you  Please note:  Although the local anesthetic injected can often make your back/ hip/ buttock/ leg feel good for several  hours after the injections, the pain will likely return.  It takes 3-7 days for steroids to work in the sacroiliac area.  You may not notice any pain relief for at least that one week.  If effective, we will often do a series of three injections spaced 3-6 weeks apart to maximally decrease your pain.  After the initial series, we generally will  wait some months before a repeat injection of the same type.  If you have any questions, please call [phone] [place]  What are the risk, side effects and possible complications? Generally speaking, most procedures are safe.  However, with any procedure there are risks, side effects, and the possibility of complications.  The risks and complications are dependent upon the sites that are lesioned, or the type of nerve block to be performed.  The closer the procedure is to the spine, the more serious the risks are.  Great care is taken when placing the radio frequency needles, block needles or lesioning probes, but sometimes complications can occur. 1. Infection: Any time there is an injection through the skin, there is a risk of infection.  This is why sterile conditions are used for these blocks.  There are four possible types of infection. 1. Localized skin infection. 2. Central Nervous System Infection-This can be in the form of Meningitis, which can be deadly. 3. Epidural Infections-This can be in the form of an epidural abscess, which can cause pressure inside of the spine, causing compression of the spinal cord with subsequent paralysis. This would require an emergency surgery to decompress, and there are no guarantees that the patient would recover from the paralysis. 4. Discitis-This is an infection of the intervertebral discs.  It occurs in about 1% of discography procedures.  It is difficult to treat and it may lead to surgery.        2. Pain: the needles have to go through skin and soft tissues, will cause soreness.       3. Damage to internal structures:  The nerves to be lesioned may be near blood vessels or    other nerves which can be potentially damaged.       4. Bleeding: Bleeding is more common if the patient is taking blood thinners such as  aspirin, Coumadin, Ticiid, Plavix, etc., or if he/she have some  genetic predisposition  such as hemophilia. Bleeding into the spinal canal can cause compression of the spinal  cord with subsequent paralysis.  This would require an emergency surgery to  decompress and there are no guarantees that the patient would recover from the  paralysis.       5. Pneumothorax:  Puncturing of a lung is a possibility, every time a needle is introduced in  the area of the chest or upper back.  Pneumothorax refers to free air around the  collapsed lung(s), inside of the thoracic cavity (chest cavity).  Another two possible  complications related to a similar event would include: Hemothorax and Chylothorax.   These are variations of the Pneumothorax, where instead of air around the collapsed  lung(s), you may have blood or chyle, respectively.       6. Spinal headaches: They may occur with any procedures in the area of the spine.       7. Persistent CSF (Cerebro-Spinal Fluid) leakage: This is a rare problem, but may occur  with prolonged intrathecal or epidural catheters either due to the formation  of a fistulous  track or a dural tear.       8. Nerve damage: By working so close to the spinal cord, there is always a possibility of  nerve damage, which could be as serious as a permanent spinal cord injury with  paralysis.       9. Death:  Although rare, severe deadly allergic reactions known as "Anaphylactic  reaction" can occur to any of the medications used.      10. Worsening of the symptoms:  We can always make thing worse.  What are the chances of something like this happening? Chances of any of this occuring are extremely low.  By statistics, you have more of a chance of getting killed in a motor vehicle accident: while driving to the hospital than any of the above occurring .  Nevertheless, you should be aware that they are possibilities.  In general, it is similar to taking a shower.  Everybody knows that you can slip, hit your head and get killed.  Does that mean that you should  not shower again?  Nevertheless always keep in mind that statistics do not mean anything if you happen to be on the wrong side of them.  Even if a procedure has a 1 (one) in a 1,000,000 (million) chance of going wrong, it you happen to be that one..Also, keep in mind that by statistics, you have more of a chance of having something go wrong when taking medications.  Who should not have this procedure? If you are on a blood thinning medication (e.g. Coumadin, Plavix, see list of "Blood Thinners"), or if you have an active infection going on, you should not have the procedure.  If you are taking any blood thinners, please inform your physician.  How should I prepare for this procedure?  Do not eat or drink anything at least six hours prior to the procedure.  Bring a driver with you .  It cannot be a taxi.  Come accompanied by an adult that can drive you back, and that is strong enough to help you if your legs get weak or numb from the local anesthetic.  Take all of your medicines the morning of the procedure with just enough water to swallow them.  If you have diabetes, make sure that you are scheduled to have your procedure done first thing in the morning, whenever possible.  If you have diabetes, take only half of your insulin dose and notify our nurse that you have done so as soon as you arrive at the clinic.  If you are diabetic, but only take blood sugar pills (oral hypoglycemic), then do not take them on the morning of your procedure.  You may take them after you have had the procedure.  Do not take aspirin or any aspirin-containing medications, at least eleven (11) days prior to the procedure.  They may prolong bleeding.  Wear loose fitting clothing that may be easy to take off and that you would not mind if it got stained with Betadine or blood.  Do not wear any jewelry or perfume  Remove any nail coloring.  It will interfere with some of our monitoring equipment.  NOTE: Remember  that this is not meant to be interpreted as a complete list of all possible complications.  Unforeseen problems may occur.  BLOOD THINNERS The following drugs contain aspirin or other products, which can cause increased bleeding during surgery and should not be taken for 2 weeks prior to and  1 week after surgery.  If you should need take something for relief of minor pain, you may take acetaminophen which is found in Tylenol,m Datril, Anacin-3 and Panadol. It is not blood thinner. The products listed below are.  Do not take any of the products listed below in addition to any listed on your instruction sheet.  A.P.C or A.P.C with Codeine Codeine Phosphate Capsules #3 Ibuprofen Ridaura  ABC compound Congesprin Imuran rimadil  Advil Cope Indocin Robaxisal  Alka-Seltzer Effervescent Pain Reliever and Antacid Coricidin or Coricidin-D  Indomethacin Rufen  Alka-Seltzer plus Cold Medicine Cosprin Ketoprofen S-A-C Tablets  Anacin Analgesic Tablets or Capsules Coumadin Korlgesic Salflex  Anacin Extra Strength Analgesic tablets or capsules CP-2 Tablets Lanoril Salicylate  Anaprox Cuprimine Capsules Levenox Salocol  Anexsia-D Dalteparin Magan Salsalate  Anodynos Darvon compound Magnesium Salicylate Sine-off  Ansaid Dasin Capsules Magsal Sodium Salicylate  Anturane Depen Capsules Marnal Soma  APF Arthritis pain formula Dewitt's Pills Measurin Stanback  Argesic Dia-Gesic Meclofenamic Sulfinpyrazone  Arthritis Bayer Timed Release Aspirin Diclofenac Meclomen Sulindac  Arthritis pain formula Anacin Dicumarol Medipren Supac  Analgesic (Safety coated) Arthralgen Diffunasal Mefanamic Suprofen  Arthritis Strength Bufferin Dihydrocodeine Mepro Compound Suprol  Arthropan liquid Dopirydamole Methcarbomol with Aspirin Synalgos  ASA tablets/Enseals Disalcid Micrainin Tagament  Ascriptin Doan's Midol Talwin  Ascriptin A/D Dolene Mobidin Tanderil  Ascriptin Extra Strength Dolobid Moblgesic Ticlid  Ascriptin with  Codeine Doloprin or Doloprin with Codeine Momentum Tolectin  Asperbuf Duoprin Mono-gesic Trendar  Aspergum Duradyne Motrin or Motrin IB Triminicin  Aspirin plain, buffered or enteric coated Durasal Myochrisine Trigesic  Aspirin Suppositories Easprin Nalfon Trillsate  Aspirin with Codeine Ecotrin Regular or Extra Strength Naprosyn Uracel  Atromid-S Efficin Naproxen Ursinus  Auranofin Capsules Elmiron Neocylate Vanquish  Axotal Emagrin Norgesic Verin  Azathioprine Empirin or Empirin with Codeine Normiflo Vitamin E  Azolid Emprazil Nuprin Voltaren  Bayer Aspirin plain, buffered or children's or timed BC Tablets or powders Encaprin Orgaran Warfarin Sodium  Buff-a-Comp Enoxaparin Orudis Zorpin  Buff-a-Comp with Codeine Equegesic Os-Cal-Gesic   Buffaprin Excedrin plain, buffered or Extra Strength Oxalid   Bufferin Arthritis Strength Feldene Oxphenbutazone   Bufferin plain or Extra Strength Feldene Capsules Oxycodone with Aspirin   Bufferin with Codeine Fenoprofen Fenoprofen Pabalate or Pabalate-SF   Buffets II Flogesic Panagesic   Buffinol plain or Extra Strength Florinal or Florinal with Codeine Panwarfarin   Buf-Tabs Flurbiprofen Penicillamine   Butalbital Compound Four-way cold tablets Penicillin   Butazolidin Fragmin Pepto-Bismol   Carbenicillin Geminisyn Percodan   Carna Arthritis Reliever Geopen Persantine   Carprofen Gold's salt Persistin   Chloramphenicol Goody's Phenylbutazone   Chloromycetin Haltrain Piroxlcam   Clmetidine heparin Plaquenil   Cllnoril Hyco-pap Ponstel   Clofibrate Hydroxy chloroquine Propoxyphen         Before stopping any of these medications, be sure to consult the physician who ordered them.  Some, such as Coumadin (Warfarin) are ordered to prevent or treat serious conditions such as "deep thrombosis", "pumonary embolisms", and other heart problems.  The amount of time that you may need off of the medication may also vary with the medication and the reason for  which you were taking it.  If you are taking any of these medications, please make sure you notify your pain physician before you undergo any procedures.

## 2016-05-05 NOTE — Progress Notes (Signed)
Safety precautions to be maintained throughout the outpatient stay will include: orient to surroundings, keep bed in low position, maintain call bell within reach at all times, provide assistance with transfer out of bed and ambulation.  

## 2016-05-05 NOTE — Telephone Encounter (Signed)
Pt is aware that Angie will get auth for her procedure. I placed the order in her box.Marland KitchenMarland KitchenTD

## 2016-05-10 ENCOUNTER — Telehealth: Payer: Self-pay | Admitting: Pain Medicine

## 2016-05-10 ENCOUNTER — Other Ambulatory Visit: Payer: Self-pay | Admitting: Pain Medicine

## 2016-05-10 NOTE — Telephone Encounter (Signed)
Having right sided back pain, cannot straighten up and in a lot of pain, could she come in today for procedure? Or what do you suggest ?

## 2016-05-10 NOTE — Telephone Encounter (Signed)
Dena Thank you for the follow up regarding patient I have decided to perform block of nerves to the sacroiliac joint (BONSI) at time of return appointment on Wednesday, 05/24/2016 if Angie says this is okay with insurance  Thank you

## 2016-05-10 NOTE — Telephone Encounter (Signed)
Per Dr. Primus Bravo, patient may come for procedure next Wednesday. She may see her PCP or go to ED if necessary. Patient notified. Appointment time 8:30

## 2016-05-17 ENCOUNTER — Ambulatory Visit: Payer: Medicare Other | Attending: Pain Medicine | Admitting: Pain Medicine

## 2016-05-17 ENCOUNTER — Encounter: Payer: Self-pay | Admitting: Pain Medicine

## 2016-05-17 VITALS — BP 145/94 | HR 60 | Temp 96.9°F | Resp 14 | Ht <= 58 in | Wt 178.0 lb

## 2016-05-17 DIAGNOSIS — M5127 Other intervertebral disc displacement, lumbosacral region: Secondary | ICD-10-CM | POA: Insufficient documentation

## 2016-05-17 DIAGNOSIS — M48062 Spinal stenosis, lumbar region with neurogenic claudication: Secondary | ICD-10-CM

## 2016-05-17 DIAGNOSIS — M5136 Other intervertebral disc degeneration, lumbar region: Secondary | ICD-10-CM | POA: Insufficient documentation

## 2016-05-17 DIAGNOSIS — M172 Bilateral post-traumatic osteoarthritis of knee: Secondary | ICD-10-CM

## 2016-05-17 DIAGNOSIS — M47816 Spondylosis without myelopathy or radiculopathy, lumbar region: Secondary | ICD-10-CM | POA: Insufficient documentation

## 2016-05-17 DIAGNOSIS — M79606 Pain in leg, unspecified: Secondary | ICD-10-CM | POA: Diagnosis present

## 2016-05-17 DIAGNOSIS — M7060 Trochanteric bursitis, unspecified hip: Secondary | ICD-10-CM

## 2016-05-17 DIAGNOSIS — M533 Sacrococcygeal disorders, not elsewhere classified: Secondary | ICD-10-CM

## 2016-05-17 DIAGNOSIS — M545 Low back pain: Secondary | ICD-10-CM | POA: Diagnosis present

## 2016-05-17 DIAGNOSIS — M5030 Other cervical disc degeneration, unspecified cervical region: Secondary | ICD-10-CM

## 2016-05-17 DIAGNOSIS — M791 Myalgia: Secondary | ICD-10-CM | POA: Diagnosis present

## 2016-05-17 DIAGNOSIS — M170 Bilateral primary osteoarthritis of knee: Secondary | ICD-10-CM

## 2016-05-17 MED ORDER — BUPIVACAINE HCL (PF) 0.25 % IJ SOLN
30.0000 mL | Freq: Once | INTRAMUSCULAR | Status: AC
Start: 2016-05-17 — End: 2016-05-17
  Administered 2016-05-17: 30 mL

## 2016-05-17 MED ORDER — FENTANYL CITRATE (PF) 100 MCG/2ML IJ SOLN
100.0000 ug | Freq: Once | INTRAMUSCULAR | Status: AC
  Administered 2016-05-17: 50 ug via INTRAVENOUS
  Filled 2016-05-17: qty 2

## 2016-05-17 MED ORDER — CEFAZOLIN IN D5W 1 GM/50ML IV SOLN
1.0000 g | Freq: Once | INTRAVENOUS | Status: DC
Start: 2016-05-17 — End: 2016-11-08

## 2016-05-17 MED ORDER — BUPIVACAINE HCL (PF) 0.25 % IJ SOLN
INTRAMUSCULAR | Status: AC
  Administered 2016-05-17: 30 mL
  Filled 2016-05-17: qty 30

## 2016-05-17 MED ORDER — MIDAZOLAM HCL 5 MG/5ML IJ SOLN
5.0000 mg | Freq: Once | INTRAMUSCULAR | Status: AC
  Administered 2016-05-17: 3 mg via INTRAVENOUS
  Filled 2016-05-17: qty 5

## 2016-05-17 MED ORDER — CEFAZOLIN SODIUM 1 G IJ SOLR
INTRAMUSCULAR | Status: AC
  Administered 2016-05-17: 11:00:00 via INTRAVENOUS
  Filled 2016-05-17: qty 10

## 2016-05-17 MED ORDER — ORPHENADRINE CITRATE 30 MG/ML IJ SOLN
60.0000 mg | Freq: Once | INTRAMUSCULAR | Status: AC
  Administered 2016-05-17: 60 mg via INTRAMUSCULAR
  Filled 2016-05-17: qty 2

## 2016-05-17 MED ORDER — CEFUROXIME AXETIL 250 MG PO TABS: 250.0000 mg | ORAL_TABLET | Freq: Two times a day (BID) | ORAL | Status: DC

## 2016-05-17 MED ORDER — TRIAMCINOLONE ACETONIDE 40 MG/ML IJ SUSP
40.0000 mg | Freq: Once | INTRAMUSCULAR | Status: AC
  Administered 2016-05-17: 40 mg
  Filled 2016-05-17: qty 1

## 2016-05-17 MED ORDER — LACTATED RINGERS IV SOLN: 1000.0000 mL | INTRAVENOUS | Status: DC

## 2016-05-17 NOTE — Patient Instructions (Addendum)
PLAN   Continue present medication Neurontin and oxycodone. Please obtain Ceftin antibiotic today and begin taking Ceftin antibiotic today as prescribed  F/U PCP Dr. Lisette Grinder III for evaliation of  BP and general medical  condition  F/U surgical evaluation. Patient has undergone surgical evaluation and is without plans for surgical intervention at this time  F/U neurological evaluation. May consider pending follow-up evaluations  May consider radiofrequency rhizolysis or intraspinal procedures pending response to present treatment and F/U evaluation  Patient to call [facility] should patient have concerns prior to scheduled return appointment  Sacroiliac (SI) Joint Injection Patient Information  Description: The sacroiliac joint connects the scrum (very low back and tailbone) to the ilium (a pelvic bone which also forms half of the hip joint).  Normally this joint experiences very little motion.  When this joint becomes inflamed or unstable low back and or hip and pelvis pain may result.  Injection of this joint with local anesthetics (numbing medicines) and steroids can provide diagnostic information and reduce pain.  This injection is performed with the aid of x-ray guidance into the tailbone area while you are lying on your stomach.   You may experience an electrical sensation down the leg while this is being done.  You may also experience numbness.  We also may ask if we are reproducing your normal pain during the injection.  Conditions which may be treated SI injection:   Low back, buttock, hip or leg pain  Preparation for the Injection:  1. Do not eat any solid food or dairy products within 8 hours of your appointment.  2. You may drink clear liquids up to 3 hours before appointment.  Clear liquids include water, black coffee, juice or soda.  No milk or cream please. 3. You may take your regular medications, including pain medications with a sip of water before your  appointment.  Diabetics should hold regular insulin (if take separately) and take 1/2 normal NPH dose the morning of the procedure.  Carry some sugar containing items with you to your appointment. 4. A driver must accompany you and be prepared to drive you home after your procedure. 5. Bring all of your current medications with you. 6. An IV may be inserted and sedation may be given at the discretion of the physician. 7. A blood pressure cuff, EKG and other monitors will often be applied during the procedure.  Some patients may need to have extra oxygen administered for a short period.  8. You will be asked to provide medical information, including your allergies, prior to the procedure.  We must know immediately if you are taking blood thinners (like Coumadin/Warfarin) or if you are allergic to IV iodine contrast (dye).  We must know if you could possible be pregnant.  Possible side effects:   Bleeding from needle site  Infection (rare, may require surgery)  Nerve injury (rare)  Numbness & tingling (temporary)  A brief convulsion or seizure  Light-headedness (temporary)  Pain at injection site (several days)  Decreased blood pressure (temporary)  Weakness in the leg (temporary)   Call if you experience:   New onset weakness or numbness of an extremity below the injection site that last more than 8 hours.  Hives or difficulty breathing ( go to the emergency room)  Inflammation or drainage at the injection site  Any new symptoms which are concerning to you  Please note:  Although the local anesthetic injected can often make your back/ hip/ buttock/ leg  feel good for several hours after the injections, the pain will likely return.  It takes 3-7 days for steroids to work in the sacroiliac area.  You may not notice any pain relief for at least that one week.  If effective, we will often do a series of three injections spaced 3-6 weeks apart to maximally decrease your pain.   After the initial series, we generally will wait some months before a repeat injection of the same type.  If you have any questions, please call [phone] [facility]  Be careful moving about. Muscle spasms in the area of the injection may occur.  Use ice for the next 24 hours (15 minutes on, 15 minutes off).  After 24 hours, you may use heat for comfort if you wish.  Post procedure numbness or redness is expected, average 4-6 hours.  If numbness develops after 4-6 hours and is felt to be progressing and worsening, immediately contact your physician.  A prescription for CEFTIN was sent to your pharmacy and should be available for pickup today.

## 2016-05-17 NOTE — Progress Notes (Signed)
Subjective:    Patient ID: [name], female    DOB: 06-Jan-1946, 70 y.o.   MRN: [id number]  HPI  PROCEDURE:  Block of nerves to the sacroiliac joint.   NOTE:  The patient is a 70 y.o. female who returns to the [facility] for further evaluation and treatment of pain involving the lower back and lower extremity region with pain in the region of the buttocks as well. Prior MRI studies reveal denerative disc disease lumbar spine with  multilevel degenerative changes lumbar spine, annular disc bulging L4-L5, central disc protrusion L5-S1, L3-4 prominent flattening of the lateral recess and left paracentral disc protrusion.  the patient is with reproduction of severe pain with palpation over the PSIS and PII S regions  There is concern regarding a significant component of the patient's pain being due to sacroiliac joint dysfunction The risks, benefits, expectations of the procedure have been discussed and explained to the patient who is understanding and willing to proceed with interventional treatment in attempt to decrease severity of patient's symptoms, minimize the risk of medication escalation and  hopefully retard the progression of the patient's symptoms. We will proceed with what is felt to be a medically necessary procedure, block of nerves to the sacroiliac joint.   DESCRIPTION OF PROCEDURE:  Block of nerves to the sacroiliac joint.   The patient was taken to the fluoroscopy suite. With the patient in the prone position with EKG, blood pressure, pulse, capnography, and pulse oximetry monitoring, IV Versed, IV fentanyl conscious sedation, Betadine prep of proposed entry site was performed.   Block of nerves at the L5 vertebral body level.   With the patient in prone position, under fluoroscopic guidance, a 22 -gauge needle was inserted at the L5 vertebral body level on the left  side. With 15 degrees oblique orientation a 22 -gauge needle was inserted in the region known as  Burton's eye or eye of the Scotty dog. Following documentation of needle placement in the area of Burton's eye or eye of the Scotty dog under fluoroscopic guidance, needle placement was then accomplished at the sacral ala level on the left  side.   Needle placement at the sacral ala.   With the patient in prone position under fluoroscopic guidance with AP view of the lumbosacral spine, a 22 -gauge needle was inserted in the region known as the sacral ala on the left  side. Following documentation of needle placement on the left  side under fluoroscopic guidance needle placement was then accomplished at the S1 foramen level.   Needle placement at the S1 foramen level.   With the patient in prone position under fluoroscopic guidance with AP view of the lumbosacral spine and cephalad orientation, a 22 -gauge needle was inserted at the superior and lateral border of the S1 foramen on the left  side. Following documentation of needle placement at the S1 foramen level on the left  side, needle placement was then accomplished at the S2 foramen level on the left  side.   Needle placement at the S2 foramen level.   With the patient in prone position with AP view of the lumbosacral spine with cephalad orientation, a 22 - gauge needle was inserted at the superior and lateral border of the S2 foramen under fluoroscopic guidance on the left  side. Following needle placement at the L5 vertebral body level, sacral ala, S1 foramen and S2 foramen on the left  side, needle placement was verified on lateral view  under fluoroscopic guidance.  Following needle placement documentation on lateral view, each needle was injected with 1 mL of 0.25% bupivacaine and Kenalog.   BLOCK OF THE NERVES TO SACROILIAC JOINT ON THE RIGHT SIDE The procedure was performed on the right side at the same levels as was performed on the left side and utilizing the same technique as on the left side and was performed under fluoroscopic guidance  as on the left side   A total of '10mg'$  of Kenalog was utilized for the procedure.   PLAN:  1. Medications: The patient will continue presently prescribed medications Neurontin and oxycodone.  2. The patient will be considered for modification of treatment regimen pending response to the procedure performed on today's visit.  3. The patient is to follow-up with primary care physician Dr. Lisette Grinder III for evaluation of blood pressure and general medical condition following the procedure performed on today's visit.  4. Surgical evaluation as discussed. Patient has undergone surgical evaluation without plans for surgical intervention due to severity of patient's condition  5. Neurological evaluation as discussed. . May consider [facility] EMG studies and other studies  6. The patient may be a candidate for radiofrequency procedures, implantation devices and other treatment pending response to treatment performed on today's visit and follow-up evaluation.  7. The patient has been advised to adhere to proper body mechanics and to avoid activities which may exacerbate the patient's symptoms.   Return appointment to [facility] as scheduled.     Review of Systems     Objective:   Physical Exam        Assessment & Plan:

## 2016-05-17 NOTE — Progress Notes (Signed)
Safety precautions to be maintained throughout the outpatient stay will include: orient to surroundings, keep bed in low position, maintain call bell within reach at all times, provide assistance with transfer out of bed and ambulation.  PCP gave predinsone taper and valium for plan last week

## 2016-05-18 ENCOUNTER — Telehealth: Payer: Self-pay | Admitting: *Deleted

## 2016-05-18 NOTE — Telephone Encounter (Signed)
Left voicemail with patient to call our office if there are questions or concerns re; procedure on yesterday. 

## 2016-06-06 ENCOUNTER — Ambulatory Visit: Payer: Medicare Other | Attending: Pain Medicine | Admitting: Pain Medicine

## 2016-06-06 ENCOUNTER — Encounter: Payer: Self-pay | Admitting: Pain Medicine

## 2016-06-06 VITALS — BP 131/80 | HR 66 | Temp 98.4°F | Resp 18 | Ht <= 58 in | Wt 180.0 lb

## 2016-06-06 DIAGNOSIS — M5030 Other cervical disc degeneration, unspecified cervical region: Secondary | ICD-10-CM

## 2016-06-06 DIAGNOSIS — M5126 Other intervertebral disc displacement, lumbar region: Secondary | ICD-10-CM | POA: Diagnosis not present

## 2016-06-06 DIAGNOSIS — M170 Bilateral primary osteoarthritis of knee: Secondary | ICD-10-CM

## 2016-06-06 DIAGNOSIS — M1610 Unilateral primary osteoarthritis, unspecified hip: Secondary | ICD-10-CM | POA: Insufficient documentation

## 2016-06-06 DIAGNOSIS — M47816 Spondylosis without myelopathy or radiculopathy, lumbar region: Secondary | ICD-10-CM | POA: Insufficient documentation

## 2016-06-06 DIAGNOSIS — M4806 Spinal stenosis, lumbar region: Secondary | ICD-10-CM | POA: Insufficient documentation

## 2016-06-06 DIAGNOSIS — M5127 Other intervertebral disc displacement, lumbosacral region: Secondary | ICD-10-CM | POA: Insufficient documentation

## 2016-06-06 DIAGNOSIS — M533 Sacrococcygeal disorders, not elsewhere classified: Secondary | ICD-10-CM | POA: Diagnosis not present

## 2016-06-06 DIAGNOSIS — M542 Cervicalgia: Secondary | ICD-10-CM | POA: Diagnosis present

## 2016-06-06 DIAGNOSIS — M546 Pain in thoracic spine: Secondary | ICD-10-CM | POA: Insufficient documentation

## 2016-06-06 DIAGNOSIS — M5136 Other intervertebral disc degeneration, lumbar region: Secondary | ICD-10-CM | POA: Diagnosis not present

## 2016-06-06 DIAGNOSIS — M79606 Pain in leg, unspecified: Secondary | ICD-10-CM | POA: Diagnosis present

## 2016-06-06 DIAGNOSIS — M51369 Other intervertebral disc degeneration, lumbar region without mention of lumbar back pain or lower extremity pain: Secondary | ICD-10-CM

## 2016-06-06 DIAGNOSIS — M48062 Spinal stenosis, lumbar region with neurogenic claudication: Secondary | ICD-10-CM

## 2016-06-06 DIAGNOSIS — M179 Osteoarthritis of knee, unspecified: Secondary | ICD-10-CM | POA: Insufficient documentation

## 2016-06-06 DIAGNOSIS — M7060 Trochanteric bursitis, unspecified hip: Secondary | ICD-10-CM

## 2016-06-06 MED ORDER — GABAPENTIN 300 MG PO CAPS: ORAL_CAPSULE | ORAL | 0 refills | Status: DC

## 2016-06-06 MED ORDER — OXYCODONE HCL 5 MG PO CAPS: ORAL_CAPSULE | ORAL | 0 refills | Status: DC

## 2016-06-06 NOTE — Progress Notes (Signed)
Safety precautions to be maintained throughout the outpatient stay will include: orient to surroundings, keep bed in low position, maintain call bell within reach at all times, provide assistance with transfer out of bed and ambulation.  

## 2016-06-06 NOTE — Patient Instructions (Addendum)
Continue present medication oxycodone and Neurontin  Lumbar epidural steroid injection to be performed at time of return appointment  F/U PCP  Dr Lisette Grinder III for evaliation of  BP  and general medical condition  F/U surgical evaluation. May consider pending follow-up evaluations  F/U neurological evaluation. May consider [facility] EMG studies/ pending follow-up evaluations  May consider radiofrequency rhizolysis or intraspinal procedures pending response to present treatment and F/U evaluation   Patient to call [facility] should patient have concerns prior to scheduled return appointmentEpidural Steroid Injection An epidural steroid injection is given to relieve pain in your neck, back, or legs that is caused by the irritation or swelling of a nerve root. This procedure involves injecting a steroid and numbing medicine (anesthetic) into the epidural space. The epidural space is the space between the outer covering of your spinal cord and the bones that form your backbone (vertebra).  LET [facility] CARE PROVIDER KNOW ABOUT:   Any allergies you have.  All medicines you are taking, including vitamins, herbs, eye drops, creams, and over-the-counter medicines such as aspirin.  Previous problems you or members of your family have had with the use of anesthetics.  Any blood disorders or blood clotting disorders you have.  Previous surgeries you have had.  Medical conditions you have. RISKS AND COMPLICATIONS Generally, this is a safe procedure. However, as with any procedure, complications can occur. Possible complications of epidural steroid injection include:  Headache.  Bleeding.  Infection.  Allergic reaction to the medicines.  Damage to your nerves. The response to this procedure depends on the underlying cause of the pain and its duration. People who have long-term (chronic) pain are less likely to benefit from epidural steroids than are those people whose pain  comes on strong and suddenly. BEFORE THE PROCEDURE   Ask your health care provider about changing or stopping your regular medicines. You may be advised to stop taking blood-thinning medicines a few days before the procedure.  You may be given medicines to reduce anxiety.  Arrange for someone to take you home after the procedure. PROCEDURE   You will remain awake during the procedure. You may receive medicine to make you relaxed.  You will be asked to lie on your stomach.  The injection site will be cleaned.  The injection site will be numbed with a medicine (local anesthetic).  A needle will be injected through your skin into the epidural space.  Your health care provider will use an X-ray machine to ensure that the steroid is delivered closest to the affected nerve. You may have minimal discomfort at this time.  Once the needle is in the right position, the local anesthetic and the steroid will be injected into the epidural space.  The needle will then be removed and a bandage will be applied to the injection site. AFTER THE PROCEDURE   You may be monitored for a short time before you go home.  You may feel weakness or numbness in your arm or leg, which disappears within hours.  You may be allowed to eat, drink, and take your regular medicine.  You may have soreness at the site of the injection.   This information is not intended to replace advice given to you by your health care provider. Make sure you discuss any questions you have with your health care provider.   Document Released: 01/23/2008 Document Revised: 06/18/2013 Document Reviewed: 04/04/2013 Elsevier Interactive Patient Education 2016 [place]  What are the risk, side effects and possible complications? Generally speaking, most procedures are safe.  However, with any procedure there are risks, side effects, and the possibility of complications.  The risks and complications  are dependent upon the sites that are lesioned, or the type of nerve block to be performed.  The closer the procedure is to the spine, the more serious the risks are.  Great care is taken when placing the radio frequency needles, block needles or lesioning probes, but sometimes complications can occur. 1. Infection: Any time there is an injection through the skin, there is a risk of infection.  This is why sterile conditions are used for these blocks.  There are four possible types of infection. 1. Localized skin infection. 2. Central Nervous System Infection-This can be in the form of Meningitis, which can be deadly. 3. Epidural Infections-This can be in the form of an epidural abscess, which can cause pressure inside of the spine, causing compression of the spinal cord with subsequent paralysis. This would require an emergency surgery to decompress, and there are no guarantees that the patient would recover from the paralysis. 4. Discitis-This is an infection of the intervertebral discs.  It occurs in about 1% of discography procedures.  It is difficult to treat and it may lead to surgery.        2. Pain: the needles have to go through skin and soft tissues, will cause soreness.       3. Damage to internal structures:  The nerves to be lesioned may be near blood vessels or    other nerves which can be potentially damaged.       4. Bleeding: Bleeding is more common if the patient is taking blood thinners such as  aspirin, Coumadin, Ticiid, Plavix, etc., or if he/she have some genetic predisposition  such as hemophilia. Bleeding into the spinal canal can cause compression of the spinal  cord with subsequent paralysis.  This would require an emergency surgery to  decompress and there are no guarantees that the patient would recover from the  paralysis.       5. Pneumothorax:  Puncturing of a lung is a possibility, every time a needle is introduced in  the area of the chest or upper back.  Pneumothorax  refers to free air around the  collapsed lung(s), inside of the thoracic cavity (chest cavity).  Another two possible  complications related to a similar event would include: Hemothorax and Chylothorax.   These are variations of the Pneumothorax, where instead of air around the collapsed  lung(s), you may have blood or chyle, respectively.       6. Spinal headaches: They may occur with any procedures in the area of the spine.       7. Persistent CSF (Cerebro-Spinal Fluid) leakage: This is a rare problem, but may occur  with prolonged intrathecal or epidural catheters either due to the formation of a fistulous  track or a dural tear.       8. Nerve damage: By working so close to the spinal cord, there is always a possibility of  nerve damage, which could be as serious as a permanent spinal cord injury with  paralysis.       9. Death:  Although rare, severe deadly allergic reactions known as "Anaphylactic  reaction" can occur to any of the medications used.      10. Worsening of the symptoms:  We can always make thing worse.  What are the  chances of something like this happening? Chances of any of this occuring are extremely low.  By statistics, you have more of a chance of getting killed in a motor vehicle accident: while driving to the hospital than any of the above occurring .  Nevertheless, you should be aware that they are possibilities.  In general, it is similar to taking a shower.  Everybody knows that you can slip, hit your head and get killed.  Does that mean that you should not shower again?  Nevertheless always keep in mind that statistics do not mean anything if you happen to be on the wrong side of them.  Even if a procedure has a 1 (one) in a 1,000,000 (million) chance of going wrong, it you happen to be that one..Also, keep in mind that by statistics, you have more of a chance of having something go wrong when taking medications.  Who should not have this procedure? If you are on a blood  thinning medication (e.g. Coumadin, Plavix, see list of "Blood Thinners"), or if you have an active infection going on, you should not have the procedure.  If you are taking any blood thinners, please inform your physician.  How should I prepare for this procedure?  Do not eat or drink anything at least six hours prior to the procedure.  Bring a driver with you .  It cannot be a taxi.  Come accompanied by an adult that can drive you back, and that is strong enough to help you if your legs get weak or numb from the local anesthetic.  Take all of your medicines the morning of the procedure with just enough water to swallow them.  If you have diabetes, make sure that you are scheduled to have your procedure done first thing in the morning, whenever possible.  If you have diabetes, take only half of your insulin dose and notify our nurse that you have done so as soon as you arrive at the clinic.  If you are diabetic, but only take blood sugar pills (oral hypoglycemic), then do not take them on the morning of your procedure.  You may take them after you have had the procedure.  Do not take aspirin or any aspirin-containing medications, at least eleven (11) days prior to the procedure.  They may prolong bleeding.  Wear loose fitting clothing that may be easy to take off and that you would not mind if it got stained with Betadine or blood.  Do not wear any jewelry or perfume  Remove any nail coloring.  It will interfere with some of our monitoring equipment.  NOTE: Remember that this is not meant to be interpreted as a complete list of all possible complications.  Unforeseen problems may occur.  BLOOD THINNERS The following drugs contain aspirin or other products, which can cause increased bleeding during surgery and should not be taken for 2 weeks prior to and 1 week after surgery.  If you should need take something for relief of minor pain, you may take acetaminophen which is found in  Tylenol,m Datril, Anacin-3 and Panadol. It is not blood thinner. The products listed below are.  Do not take any of the products listed below in addition to any listed on your instruction sheet.  A.P.C or A.P.C with Codeine Codeine Phosphate Capsules #3 Ibuprofen Ridaura  ABC compound Congesprin Imuran rimadil  Advil Cope Indocin Robaxisal  Alka-Seltzer Effervescent Pain Reliever and Antacid Coricidin or Coricidin-D  Indomethacin Rufen  Alka-Seltzer plus Cold  Medicine Cosprin Ketoprofen S-A-C Tablets  Anacin Analgesic Tablets or Capsules Coumadin Korlgesic Salflex  Anacin Extra Strength Analgesic tablets or capsules CP-2 Tablets Lanoril Salicylate  Anaprox Cuprimine Capsules Levenox Salocol  Anexsia-D Dalteparin Magan Salsalate  Anodynos Darvon compound Magnesium Salicylate Sine-off  Ansaid Dasin Capsules Magsal Sodium Salicylate  Anturane Depen Capsules Marnal Soma  APF Arthritis pain formula Dewitt's Pills Measurin Stanback  Argesic Dia-Gesic Meclofenamic Sulfinpyrazone  Arthritis Bayer Timed Release Aspirin Diclofenac Meclomen Sulindac  Arthritis pain formula Anacin Dicumarol Medipren Supac  Analgesic (Safety coated) Arthralgen Diffunasal Mefanamic Suprofen  Arthritis Strength Bufferin Dihydrocodeine Mepro Compound Suprol  Arthropan liquid Dopirydamole Methcarbomol with Aspirin Synalgos  ASA tablets/Enseals Disalcid Micrainin Tagament  Ascriptin Doan's Midol Talwin  Ascriptin A/D Dolene Mobidin Tanderil  Ascriptin Extra Strength Dolobid Moblgesic Ticlid  Ascriptin with Codeine Doloprin or Doloprin with Codeine Momentum Tolectin  Asperbuf Duoprin Mono-gesic Trendar  Aspergum Duradyne Motrin or Motrin IB Triminicin  Aspirin plain, buffered or enteric coated Durasal Myochrisine Trigesic  Aspirin Suppositories Easprin Nalfon Trillsate  Aspirin with Codeine Ecotrin Regular or Extra Strength Naprosyn Uracel  Atromid-S Efficin Naproxen Ursinus  Auranofin Capsules Elmiron Neocylate  Vanquish  Axotal Emagrin Norgesic Verin  Azathioprine Empirin or Empirin with Codeine Normiflo Vitamin E  Azolid Emprazil Nuprin Voltaren  Bayer Aspirin plain, buffered or children's or timed BC Tablets or powders Encaprin Orgaran Warfarin Sodium  Buff-a-Comp Enoxaparin Orudis Zorpin  Buff-a-Comp with Codeine Equegesic Os-Cal-Gesic   Buffaprin Excedrin plain, buffered or Extra Strength Oxalid   Bufferin Arthritis Strength Feldene Oxphenbutazone   Bufferin plain or Extra Strength Feldene Capsules Oxycodone with Aspirin   Bufferin with Codeine Fenoprofen Fenoprofen Pabalate or Pabalate-SF   Buffets II Flogesic Panagesic   Buffinol plain or Extra Strength Florinal or Florinal with Codeine Panwarfarin   Buf-Tabs Flurbiprofen Penicillamine   Butalbital Compound Four-way cold tablets Penicillin   Butazolidin Fragmin Pepto-Bismol   Carbenicillin Geminisyn Percodan   Carna Arthritis Reliever Geopen Persantine   Carprofen Gold's salt Persistin   Chloramphenicol Goody's Phenylbutazone   Chloromycetin Haltrain Piroxlcam   Clmetidine heparin Plaquenil   Cllnoril Hyco-pap Ponstel   Clofibrate Hydroxy chloroquine Propoxyphen         Before stopping any of these medications, be sure to consult the physician who ordered them.  Some, such as Coumadin (Warfarin) are ordered to prevent or treat serious conditions such as "deep thrombosis", "pumonary embolisms", and other heart problems.  The amount of time that you may need off of the medication may also vary with the medication and the reason for which you were taking it.  If you are taking any of these medications, please make sure you notify your pain physician before you undergo any procedures.

## 2016-06-06 NOTE — Progress Notes (Signed)
    The patient is a 70 year old female who returns to pain management for further evaluation and treatment of pain involving the neck entire back upper and lower extremity region. Patient continues to be with severely disabling pain and continue the use of medications consisting of Neurontin and oxycodone. The patient denies trauma change in events of daily living the call significant change in symptomatology. The patient is undergone surgical evaluation without recommendation for surgical intervention due to severity of patient's condition and of the spine. The patient has been recommended to continue treatment in pain management Center for treatment of her condition. We discussed patient's condition on today's visit and decision was made to proceed with lumbar epidural steroid injection at time return appointment. The patient is a pain aggravated by standing walking and becoming more severe as patient spends time on the feet and is associated with severe throbbing pain followed by severe weakness of the lower extremities. There is concern regarding patient being with significant component of pain due to lumbar stenosis with neurogenic claudication. We discussed patient's condition and will proceed with lumbar epidural steroid injection at time return appointment. All agreed to suggested treatment plan     Physical examination  There was tenderness of the splenius capitis and occipitalis region palpation which reproduces moderate discomfort with moderate tenderness of the cervical facet cervical paraspinal musculature region. Palpation of the acromioclavicular and glenohumeral joint regions reproduce moderate discomfort and patient was with difficulty performing the drop test. Patient appeared to be with slightly decreased strength with Tinel and Phalen's maneuver reproducing moderate discomfort. Palpation over the thoracic region was attends to palpation of moderate degree with lateral bending and  rotation extension and palpation over the thoracic region reproducing moderate discomfort. Palpation over the lumbar paraspinal musculature region associated with severe pain with severe tenderness of the PSIS PII S region gluteal and piriformis musculature region as well as the paraspinal musculature region of the lumbar region. There was no definite sensory deficit or dermatomal distribution detected. EHL strength appeared to be decreased. There was negative clonus negative Homans. Abdomen was nontender with no costovertebral tenderness noted. There was tends to palpation of the knees without increased warmth and erythema in the region of the knees. Straight leg raising limited to 20 with questionable increase pain with dorsiflexion noted. There was negative clonus negative Homans. Abdomen was attends to palpation of significant degree and no costovertebral tenderness noted.     Assessment     Denerative disc disease lumbar spine Multilevel degenerative changes lumbar spine, annular disc bulging L4-L5, central disc protrusion L5-S1, L3-4 prominent flattening of the lateral recess and left paracentral disc protrusion  Lumbar stenosis with neurogenic claudication  Lumbar facet syndrome  Sacroiliac joint dysfunction  Degenerative joint disease of knee  Degenerative joint disease of hip      Continue present medication oxycodone and Neurontin  Lumbar epidural steroid injection to be performed at time of return appointment  F/U PCP  Dr Lisette Grinder III for evaliation of  BP  and general medical condition  F/U surgical evaluation. May consider pending follow-up evaluations  F/U neurological evaluation. May consider [facility] EMG studies/ pending follow-up evaluations  May consider radiofrequency rhizolysis or intraspinal procedures pending response to present treatment and F/U evaluation   Patient to call Pain Management Center should patient have concerns prior to scheduled return  appointment

## 2016-06-14 ENCOUNTER — Ambulatory Visit: Payer: Medicare Other | Attending: Pain Medicine | Admitting: Pain Medicine

## 2016-06-14 ENCOUNTER — Encounter: Payer: Self-pay | Admitting: Pain Medicine

## 2016-06-14 VITALS — BP 124/89 | HR 61 | Temp 97.7°F | Resp 16 | Ht <= 58 in | Wt 180.0 lb

## 2016-06-14 DIAGNOSIS — M48062 Spinal stenosis, lumbar region with neurogenic claudication: Secondary | ICD-10-CM

## 2016-06-14 DIAGNOSIS — M5136 Other intervertebral disc degeneration, lumbar region: Secondary | ICD-10-CM | POA: Diagnosis not present

## 2016-06-14 DIAGNOSIS — M533 Sacrococcygeal disorders, not elsewhere classified: Secondary | ICD-10-CM

## 2016-06-14 DIAGNOSIS — M7060 Trochanteric bursitis, unspecified hip: Secondary | ICD-10-CM

## 2016-06-14 DIAGNOSIS — M170 Bilateral primary osteoarthritis of knee: Secondary | ICD-10-CM

## 2016-06-14 DIAGNOSIS — M545 Low back pain: Secondary | ICD-10-CM | POA: Diagnosis present

## 2016-06-14 DIAGNOSIS — M5126 Other intervertebral disc displacement, lumbar region: Secondary | ICD-10-CM | POA: Insufficient documentation

## 2016-06-14 DIAGNOSIS — M47816 Spondylosis without myelopathy or radiculopathy, lumbar region: Secondary | ICD-10-CM | POA: Diagnosis not present

## 2016-06-14 DIAGNOSIS — M5030 Other cervical disc degeneration, unspecified cervical region: Secondary | ICD-10-CM

## 2016-06-14 DIAGNOSIS — M79606 Pain in leg, unspecified: Secondary | ICD-10-CM | POA: Diagnosis present

## 2016-06-14 MED ORDER — ORPHENADRINE CITRATE 30 MG/ML IJ SOLN
60.0000 mg | Freq: Once | INTRAMUSCULAR | Status: AC
  Administered 2016-06-14: 60 mg via INTRAMUSCULAR
  Filled 2016-06-14: qty 2

## 2016-06-14 MED ORDER — CEFUROXIME AXETIL 250 MG PO TABS: 250.0000 mg | ORAL_TABLET | Freq: Two times a day (BID) | ORAL | 0 refills | Status: DC

## 2016-06-14 MED ORDER — TRIAMCINOLONE ACETONIDE 40 MG/ML IJ SUSP
40.0000 mg | Freq: Once | INTRAMUSCULAR | Status: AC
  Administered 2016-06-14: 40 mg
  Filled 2016-06-14: qty 1

## 2016-06-14 MED ORDER — CEFAZOLIN SODIUM 1 G IJ SOLR
INTRAMUSCULAR | Status: AC
  Administered 2016-06-14: 1 g via INTRAVENOUS
  Filled 2016-06-14: qty 10

## 2016-06-14 MED ORDER — BUPIVACAINE HCL (PF) 0.25 % IJ SOLN
30.0000 mL | Freq: Once | INTRAMUSCULAR | Status: AC
  Administered 2016-06-14: 30 mL
  Filled 2016-06-14: qty 30

## 2016-06-14 MED ORDER — SODIUM CHLORIDE 0.9% FLUSH
20.0000 mL | Freq: Once | INTRAVENOUS | Status: AC
  Administered 2016-06-14: 20 mL

## 2016-06-14 MED ORDER — LACTATED RINGERS IV SOLN: 1000.0000 mL | INTRAVENOUS | Status: DC

## 2016-06-14 MED ORDER — FENTANYL CITRATE (PF) 100 MCG/2ML IJ SOLN
100.0000 ug | Freq: Once | INTRAMUSCULAR | Status: AC
  Administered 2016-06-14: 50 ug via INTRAVENOUS
  Filled 2016-06-14: qty 2

## 2016-06-14 MED ORDER — LIDOCAINE HCL (PF) 1 % IJ SOLN
10.0000 mL | Freq: Once | INTRAMUSCULAR | Status: AC
  Administered 2016-06-14: 10 mL via SUBCUTANEOUS
  Filled 2016-06-14: qty 10

## 2016-06-14 MED ORDER — MIDAZOLAM HCL 5 MG/5ML IJ SOLN
5.0000 mg | Freq: Once | INTRAMUSCULAR | Status: AC
  Administered 2016-06-14: 4 mg via INTRAVENOUS
  Filled 2016-06-14: qty 5

## 2016-06-14 MED ORDER — CEFAZOLIN IN D5W 1 GM/50ML IV SOLN
1.0000 g | Freq: Once | INTRAVENOUS | Status: AC
  Administered 2016-06-14: 1 g via INTRAVENOUS

## 2016-06-14 MED ORDER — SODIUM CHLORIDE 0.9 % IJ SOLN
INTRAMUSCULAR | Status: AC
  Filled 2016-06-14: qty 20

## 2016-06-14 NOTE — Patient Instructions (Addendum)
Smoking Cessation, Tips for Success If you are ready to quit smoking, congratulations! You have chosen to help yourself be healthier. Cigarettes bring nicotine, tar, carbon monoxide, and other irritants into your body. Your lungs, heart, and blood vessels will be able to work better without these poisons. There are many different ways to quit smoking. Nicotine gum, nicotine patches, a nicotine inhaler, or nicotine nasal spray can help with physical craving. Hypnosis, support groups, and medicines help break the habit of smoking. WHAT THINGS CAN I DO TO MAKE QUITTING EASIER?  Here are some tips to help you quit for good: Pick a date when you will quit smoking completely. Tell all of your friends and family about your plan to quit on thatPLAN   Continue present medication Neurontin and oxycodone. Please obtain Ceftin antibiotic today and begin taking Ceftin antibiotic today as prescribed  F/U PCP Dr. Lisette Grinder III for evaliation of  BP and general medical  condition  F/U surgical evaluation. Patient has undergone surgical evaluation and is without plans for surgical intervention at this time  F/U neurological evaluation. May consider pending follow-up evaluations  May consider radiofrequency rhizolysis or intraspinal procedures pending response to present treatment and F/U evaluation  Patient to call Pain Management Center should patient have concerns prior to scheduled return appointment date.  Do not try to slowly cut down on the number of cigarettes you are smoking. Pick a quit date and quit smoking completely starting on that day.  Throw away all cigarettes.   Clean and remove all ashtrays from your home, work, and car.  On a card, write down your reasons for quitting. Carry the card with you and read it when you get the urge to smoke.  Cleanse your body of nicotine. Drink enough water and fluids to keep your urine clear or pale yellow. Do this after quitting to flush the nicotine  from your body.  Learn to predict your moods. Do not let a bad situation be your excuse to have a cigarette. Some situations in your life might tempt you into wanting a cigarette.  Never have "just one" cigarette. It leads to wanting another and another. Remind yourself of your decision to quit.  Change habits associated with smoking. If you smoked while driving or when feeling stressed, try other activities to replace smoking. Stand up when drinking your coffee. Brush your teeth after eating. Sit in a different chair when you read the paper. Avoid alcohol while trying to quit, and try to drink fewer caffeinated beverages. Alcohol and caffeine may urge you to smoke.  Avoid foods and drinks that can trigger a desire to smoke, such as sugary or spicy foods and alcohol.  Ask people who smoke not to smoke around you.  Have something planned to do right after eating or having a cup of coffee. For example, plan to take a walk or exercise.  Try a relaxation exercise to calm you down and decrease your stress. Remember, you may be tense and nervous for the first 2 weeks after you quit, but this will pass.  Find new activities to keep your hands busy. Play with a pen, coin, or rubber band. Doodle or draw things on paper.  Brush your teeth right after eating. This will help cut down on the craving for the taste of tobacco after meals. You can also try mouthwash.   Use oral substitutes in place of cigarettes. Try using lemon drops, carrots, cinnamon sticks, or chewing gum. Keep them handy so  they are available when you have the urge to smoke.  When you have the urge to smoke, try deep breathing.  Designate your home as a nonsmoking area.  If you are a heavy smoker, ask your health care provider about a prescription for nicotine chewing gum. It can ease your withdrawal from nicotine.  Reward yourself. Set aside the cigarette money you save and buy yourself something nice.  Look for support from  others. Join a support group or smoking cessation program. Ask someone at home or at work to help you with your plan to quit smoking.  Always ask yourself, "Do I need this cigarette or is this just a reflex?" Tell yourself, "Today, I choose not to smoke," or "I do not want to smoke." You are reminding yourself of your decision to quit.  Do not replace cigarette smoking with electronic cigarettes (commonly called e-cigarettes). The safety of e-cigarettes is unknown, and some may contain harmful chemicals.  If you relapse, do not give up! Plan ahead and think about what you will do the next time you get the urge to smoke. HOW WILL I FEEL WHEN I QUIT SMOKING? You may have symptoms of withdrawal because your body is used to nicotine (the addictive substance in cigarettes). You may crave cigarettes, be irritable, feel very hungry, cough often, get headaches, or have difficulty concentrating. The withdrawal symptoms are only temporary. They are strongest when you first quit but will go away within 10-14 days. When withdrawal symptoms occur, stay in control. Think about your reasons for quitting. Remind yourself that these are signs that your body is healing and getting used to being without cigarettes. Remember that withdrawal symptoms are easier to treat than the major diseases that smoking can cause.  Even after the withdrawal is over, expect periodic urges to smoke. However, these cravings are generally short lived and will go away whether you smoke or not. Do not smoke! WHAT RESOURCES ARE AVAILABLE TO HELP ME QUIT SMOKING? Your health care provider can direct you to community resources or hospitals for support, which may include:  Group support.  Education.  Hypnosis.  Therapy.   This information is not intended to replace advice given to you by your health care provider. Make sure you discuss any questions you have with your health care provider.   Document Released: 07/14/2004 Document  Revised: 11/06/2014 Document Reviewed: 04/03/2013 Elsevier Interactive Patient Education 2016 [employer]   Epidural Steroid Injection Patient Information  Description: The epidural space surrounds the nerves as they exit the spinal cord.  In some patients, the nerves can be compressed and inflamed by a bulging disc or a tight spinal canal (spinal stenosis).  By injecting steroids into the epidural space, we can bring irritated nerves into direct contact with a potentially helpful medication.  These steroids act directly on the irritated nerves and can reduce swelling and inflammation which often leads to decreased pain.  Epidural steroids may be injected anywhere along the spine and from the neck to the low back depending upon the location of your pain.   After numbing the skin with local anesthetic (like Novocaine), a small needle is passed into the epidural space slowly.  You may experience a sensation of pressure while this is being done.  The entire block usually last less than 10 minutes.  Conditions which may be treated by epidural steroids:   Low back and leg pain  Neck and arm pain  Spinal stenosis  Post-laminectomy syndrome  Herpes zoster (  shingles) pain  Pain from compression fractures  Preparation for the injection:  1. Do not eat any solid food or dairy products within 8 hours of your appointment.  2. You may drink clear liquids up to 3 hours before appointment.  Clear liquids include water, black coffee, juice or soda.  No milk or cream please. 3. You may take your regular medication, including pain medications, with a sip of water before your appointment  Diabetics should hold regular insulin (if taken separately) and take 1/2 normal NPH dos the morning of the procedure.  Carry some sugar containing items with you to your appointment. 4. A driver must accompany you and be prepared to drive you home after your procedure.  5. Bring all your current medications with  your. 6. An IV may be inserted and sedation may be given at the discretion of the physician.   7. A blood pressure cuff, EKG and other monitors will often be applied during the procedure.  Some patients may need to have extra oxygen administered for a short period. 8. You will be asked to provide medical information, including your allergies, prior to the procedure.  We must know immediately if you are taking blood thinners (like Coumadin/Warfarin)  Or if you are allergic to IV iodine contrast (dye). We must know if you could possible be pregnant.  Possible side-effects:  Bleeding from needle site  Infection (rare, may require surgery)  Nerve injury (rare)  Numbness & tingling (temporary)  Difficulty urinating (rare, temporary)  Spinal headache ( a headache worse with upright posture)  Light -headedness (temporary)  Pain at injection site (several days)  Decreased blood pressure (temporary)  Weakness in arm/leg (temporary)  Pressure sensation in back/neck (temporary)  Call if you experience:  Fever/chills associated with headache or increased back/neck pain.  Headache worsened by an upright position.  New onset weakness or numbness of an extremity below the injection site  Hives or difficulty breathing (go to the emergency room)  Inflammation or drainage at the infection site  Severe back/neck pain  Any new symptoms which are concerning to you  Please note:  Although the local anesthetic injected can often make your back or neck feel good for several hours after the injection, the pain will likely return.  It takes 3-7 days for steroids to work in the epidural space.  You may not notice any pain relief for at least that one week.  If effective, we will often do a series of three injections spaced 3-6 weeks apart to maximally decrease your pain.  After the initial series, we generally will wait several months before considering a repeat injection of the same  type.  If you have any questions, please call [phone] [facility]  Pain Management Discharge Instructions  General Discharge Instructions :  If you need to reach your doctor call: Monday-Friday 8:00 am - 4:00 pm at [phone] or toll free [phone].  After clinic hours [phone] to have operator reach doctor.  Bring all of your medication bottles to all your appointments in the pain clinic.  To cancel or reschedule your appointment with Pain Management please remember to call 24 hours in advance to avoid a fee.  Refer to the educational materials which you have been given on: General Risks, I had my Procedure. Discharge Instructions, Post Sedation.  Post Procedure Instructions:  The drugs you were given will stay in your system until tomorrow, so for the next 24 hours you should  not drive, make any legal decisions or drink any alcoholic beverages.  You may eat anything you prefer, but it is better to start with liquids then soups and crackers, and gradually work up to solid foods.  Please notify your doctor immediately if you have any unusual bleeding, trouble breathing or pain that is not related to your normal pain.  Depending on the type of procedure that was done, some parts of your body may feel week and/or numb.  This usually clears up by tonight or the next day.  Walk with the use of an assistive device or accompanied by an adult for the 24 hours.  You may use ice on the affected area for the first 24 hours.  Put ice in a Ziploc bag and cover with a towel and place against area 15 minutes on 15 minutes off.  You may switch to heat after 24 hours.

## 2016-06-14 NOTE — Progress Notes (Signed)
                                                                                                   CAUDAL EPIDURAL STEROID INJECTION     The patient is a 70 -year-old female who returns to pain management for further evaluation and treatment of pain involving the lower back and lower extremity region predominantly. The patient's pain has persisted despite prior treatment. Prior studies reveal patient to be with  Denerative disc disease lumbar spine Multilevel degenerative changes lumbar spine, annular disc bulging L4-L5, central disc protrusion L5-S1, L3-4 prominent flattening of the lateral recess and left paracentral disc protrusion . There is concern regarding intraspinal abnormalities contributing to patient's symptomatology. There is concern regarding component of patient's pain began due to lumbar stenosis with neurogenic claudication to significant degree. The risks, benefits, and expectations of the procedure have been discussed with the patient and explained to the patient who has an understanding of the procedure and wishes to proceed with the procedure. All are with an understanding of the proposed procedure and are in agreement with the suggested treatment plan.       Description Of Procedure:  Caudal Epidural Steroid Injection  The patient was taken to the fluoroscopy suite and assumed the prone position. EKG, blood pressure, pulse, pulse oximetry, and capnography monitors were all in place. IV Versed and IV fentanyl conscious sedation was accomplished.  Identification of landmarks for the procedure was accomplished Betadine prep of proposed needle entry site was accomplished.  A local anesthetic skin wheal of 1.5% lidocaine plain was accomplished. A 22-gauge needle was inserted into the caudal canal with negative heme and negative CSF return and fluoroscopic documentation of needle placement in the caudal canal was verified.. A total of 20 cc of preservative-free normal saline with  40 mg of Kenalog was injected incrementally via the 22-gauge needle documented to be in the caudal canal with fluoroscopy. The needle was removed.  Myoneural block injections of the lumbar paraspinal musculature region Following Betadine prep of proposed entry site a 22-gauge needle was inserted into the lumbar paraspinal musculature region and following negative aspiration a total of 2 cc of 0.25% bupivacaine with Norflex was injected for myoneural block injection of the lumbar paraspinal musculature region 4   The patient tolerated the procedure well.    PLAN  Continue present medications Neurontin and oxycodone  F/U PCP Dr. Lisette Grinder III for evaliation of  BP and general medical  condition  F/U surgical evaluation. Neurosurgery has recommended patient continue treatment in pain management. Patient not felt to be candidate for surgery due to severe abnormalities of the lumbosacral spine  F/U neurological evaluation. May consider pending follow-up evaluations  May consider radiofrequency rhizolysis or intraspinal procedures pending response to present treatment and F/U evaluation   The patient is to call Pain Management Center should patient have concerns prior to scheduled return appointment.

## 2016-06-15 NOTE — Telephone Encounter (Signed)
Denies any needs or concerns after procedure- Instructed to call if needed

## 2016-06-27 ENCOUNTER — Telehealth: Payer: Self-pay | Admitting: *Deleted

## 2016-07-04 ENCOUNTER — Ambulatory Visit: Payer: Medicare Other | Admitting: Pain Medicine

## 2016-07-05 ENCOUNTER — Encounter: Payer: Self-pay | Admitting: Pain Medicine

## 2016-07-05 ENCOUNTER — Ambulatory Visit: Payer: Medicare Other | Attending: Pain Medicine | Admitting: Pain Medicine

## 2016-07-05 VITALS — BP 144/83 | HR 59 | Resp 16 | Ht <= 58 in | Wt 180.0 lb

## 2016-07-05 DIAGNOSIS — M5136 Other intervertebral disc degeneration, lumbar region: Secondary | ICD-10-CM | POA: Diagnosis not present

## 2016-07-05 DIAGNOSIS — M5127 Other intervertebral disc displacement, lumbosacral region: Secondary | ICD-10-CM | POA: Insufficient documentation

## 2016-07-05 DIAGNOSIS — M545 Low back pain: Secondary | ICD-10-CM | POA: Diagnosis present

## 2016-07-05 DIAGNOSIS — M533 Sacrococcygeal disorders, not elsewhere classified: Secondary | ICD-10-CM | POA: Insufficient documentation

## 2016-07-05 DIAGNOSIS — M179 Osteoarthritis of knee, unspecified: Secondary | ICD-10-CM | POA: Insufficient documentation

## 2016-07-05 DIAGNOSIS — M4184 Other forms of scoliosis, thoracic region: Secondary | ICD-10-CM | POA: Insufficient documentation

## 2016-07-05 DIAGNOSIS — M5126 Other intervertebral disc displacement, lumbar region: Secondary | ICD-10-CM | POA: Diagnosis not present

## 2016-07-05 DIAGNOSIS — M4186 Other forms of scoliosis, lumbar region: Secondary | ICD-10-CM | POA: Insufficient documentation

## 2016-07-05 DIAGNOSIS — M7060 Trochanteric bursitis, unspecified hip: Secondary | ICD-10-CM

## 2016-07-05 DIAGNOSIS — M5030 Other cervical disc degeneration, unspecified cervical region: Secondary | ICD-10-CM

## 2016-07-05 DIAGNOSIS — M170 Bilateral primary osteoarthritis of knee: Secondary | ICD-10-CM

## 2016-07-05 DIAGNOSIS — M1610 Unilateral primary osteoarthritis, unspecified hip: Secondary | ICD-10-CM | POA: Diagnosis not present

## 2016-07-05 DIAGNOSIS — M4806 Spinal stenosis, lumbar region: Secondary | ICD-10-CM | POA: Insufficient documentation

## 2016-07-05 DIAGNOSIS — M47816 Spondylosis without myelopathy or radiculopathy, lumbar region: Secondary | ICD-10-CM | POA: Insufficient documentation

## 2016-07-05 MED ORDER — GABAPENTIN 300 MG PO CAPS: ORAL_CAPSULE | ORAL | 0 refills | Status: DC

## 2016-07-05 MED ORDER — OXYCODONE HCL 5 MG PO CAPS: ORAL_CAPSULE | ORAL | 0 refills | Status: DC

## 2016-07-05 NOTE — Patient Instructions (Signed)
   Continue present medication oxycodone and Neurontin  F/U PCP  Dr Lisette Grinder III for evaliation of  BP  and general medical condition  F/U surgical evaluation. May consider pending follow-up evaluations  F/U neurological evaluation. May consider [facility] EMG studies/ pending follow-up evaluations  May consider radiofrequency rhizolysis or intraspinal procedures pending response to present treatment and F/U evaluation   Patient to call Pain Management Center should patient have concerns prior to scheduled return appointment

## 2016-07-05 NOTE — Progress Notes (Signed)
     The patient is a 71 year old female who returns to pain management for further evaluation and treatment of pain involving the lumbar lower extremity region. The patient has significant improvement of pain following lumbar epidural steroid injection. The patient continues Neurontin and oxycodone without any undesirable side effects. We discussed additional treatment modifications in the present time we will continue presently prescribed medications Neurontin and oxycodone. The patient is without trauma change in events of daily living the call significant change in symptomatology. We will continues present medications and we will remain available to consider additional modifications of treatment regimen pending follow-up evaluation. The patient is undergone prior surgical evaluation without recommendation for surgical intervention due to patient's general medical condition and condition of the spine. Straight treatment plan.     Physical examination   There was tenderness to palpation of paraspinal muscular treat the cervical region cervical facet region a moderate degree with moderate tenderness over the splenius capitis and occipitalis region. Patient was with mild difficulty performed the drop test. The patient appeared to be with slightly decreased grip strength with Tinel and Phalen's maneuver reproducing minimal discomfort. Palpation of the thoracic region was with tenderness to palpation with no crepitus of the thoracic region noted. There was kyphoscoliosis of the thoracic oh lumbar spine noted. Palpation over the PSIS and PII S region reproduced moderate discomfort and straight leg raise was tolerates approximately 20 without increased pain with dorsiflexion noted. EHL strength was decreased and there was questionably decreased sensation along the L5 dermatomal distribution with negative clonus negative Homans. There was no allodynia of the lower extremities. Abdomen nontender and  protuberant with no excessive test palpation and no costovertebral tenderness noted     Assessment   Denerative disc disease lumbar spine Multilevel degenerative changes lumbar spine, annular disc bulging L4-L5, central disc protrusion L5-S1, L3-4 prominent flattening of the lateral recess and left paracentral disc protrusion  Lumbar stenosis with neurogenic claudication  Lumbar facet syndrome  Sacroiliac joint dysfunction  Degenerative joint disease of knee  Degenerative joint disease of hip    PLAN  Continue present medication oxycodone and Neurontin  F/U PCP  Dr Lisette Grinder III for evaliation of  BP  and general medical condition  F/U surgical evaluation. May consider pending follow-up evaluations  F/U neurological evaluation. May consider [facility] EMG studies/ pending follow-up evaluations  May consider radiofrequency rhizolysis or intraspinal procedures pending response to present treatment and F/U evaluation   Patient to call Pain Management Center should patient have concerns prior to scheduled return appointment

## 2016-07-05 NOTE — Progress Notes (Signed)
Patient here for medication management.   Safety precautions to be maintained throughout the outpatient stay will include: orient to surroundings, keep bed in low position, maintain call bell within reach at all times, provide assistance with transfer out of bed and ambulation.

## 2016-11-04 ENCOUNTER — Emergency Department: Payer: Medicare HMO

## 2016-11-04 ENCOUNTER — Encounter: Payer: Self-pay | Admitting: Emergency Medicine

## 2016-11-04 ENCOUNTER — Inpatient Hospital Stay
Admission: EM | Admit: 2016-11-04 | Discharge: 2016-11-08 | DRG: 190 | Disposition: A | Discharge status: Home / Self Care | Payer: Medicare HMO | Attending: Internal Medicine | Admitting: Internal Medicine

## 2016-11-04 DIAGNOSIS — R0602 Shortness of breath: Secondary | ICD-10-CM | POA: Diagnosis not present

## 2016-11-04 DIAGNOSIS — Z791 Long term (current) use of non-steroidal anti-inflammatories (NSAID): Secondary | ICD-10-CM

## 2016-11-04 DIAGNOSIS — F17210 Nicotine dependence, cigarettes, uncomplicated: Secondary | ICD-10-CM | POA: Diagnosis present

## 2016-11-04 DIAGNOSIS — I10 Essential (primary) hypertension: Secondary | ICD-10-CM | POA: Diagnosis present

## 2016-11-04 DIAGNOSIS — J9620 Acute and chronic respiratory failure, unspecified whether with hypoxia or hypercapnia: Secondary | ICD-10-CM | POA: Diagnosis present

## 2016-11-04 DIAGNOSIS — J440 Chronic obstructive pulmonary disease with acute lower respiratory infection: Secondary | ICD-10-CM | POA: Diagnosis present

## 2016-11-04 DIAGNOSIS — Z91048 Other nonmedicinal substance allergy status: Secondary | ICD-10-CM

## 2016-11-04 DIAGNOSIS — J209 Acute bronchitis, unspecified: Secondary | ICD-10-CM

## 2016-11-04 DIAGNOSIS — F329 Major depressive disorder, single episode, unspecified: Secondary | ICD-10-CM | POA: Diagnosis present

## 2016-11-04 DIAGNOSIS — J441 Chronic obstructive pulmonary disease with (acute) exacerbation: Secondary | ICD-10-CM | POA: Diagnosis present

## 2016-11-04 DIAGNOSIS — M5136 Other intervertebral disc degeneration, lumbar region: Secondary | ICD-10-CM | POA: Diagnosis present

## 2016-11-04 DIAGNOSIS — E039 Hypothyroidism, unspecified: Secondary | ICD-10-CM | POA: Diagnosis present

## 2016-11-04 DIAGNOSIS — J9600 Acute respiratory failure, unspecified whether with hypoxia or hypercapnia: Secondary | ICD-10-CM

## 2016-11-04 HISTORY — DX: Restless legs syndrome: G25.81

## 2016-11-04 HISTORY — DX: Depression, unspecified: F32.A

## 2016-11-04 HISTORY — DX: Major depressive disorder, single episode, unspecified: F32.9

## 2016-11-04 LAB — LACTIC ACID, PLASMA: Lactic Acid, Venous: 1.7 mmol/L (ref 0.5–1.9)

## 2016-11-04 LAB — CBC
HCT: 39.4 % (ref 35.0–47.0)
Hemoglobin: 12.9 g/dL (ref 12.0–16.0)
MCH: 27.1 pg (ref 26.0–34.0)
MCHC: 32.6 g/dL (ref 32.0–36.0)
MCV: 83 fL (ref 80.0–100.0)
Platelets: 256 10*3/uL (ref 150–440)
RBC: 4.74 MIL/uL (ref 3.80–5.20)
RDW: 16.8 % — ABNORMAL HIGH (ref 11.5–14.5)
WBC: 22.1 10*3/uL — ABNORMAL HIGH (ref 3.6–11.0)

## 2016-11-04 LAB — BLOOD GAS, VENOUS
Acid-Base Excess: 0.7 mmol/L (ref 0.0–2.0)
Bicarbonate: 25.4 mmol/L (ref 20.0–28.0)
O2 Saturation: 64.3 %
Patient temperature: 37
pCO2, Ven: 40 mmHg — ABNORMAL LOW (ref 44.0–60.0)
pH, Ven: 7.41 (ref 7.250–7.430)
pO2, Ven: 33 mmHg (ref 32.0–45.0)

## 2016-11-04 LAB — BASIC METABOLIC PANEL
Anion gap: 9 (ref 5–15)
BUN: 10 mg/dL (ref 6–20)
CO2: 26 mmol/L (ref 22–32)
Calcium: 9.3 mg/dL (ref 8.9–10.3)
Chloride: 97 mmol/L — ABNORMAL LOW (ref 101–111)
Creatinine, Ser: 0.57 mg/dL (ref 0.44–1.00)
GFR calc Af Amer: >60 mL/min (ref >60)
GFR calc non Af Amer: >60 mL/min (ref >60)
Glucose, Bld: 111 mg/dL — ABNORMAL HIGH (ref 65–99)
Potassium: 3.9 mmol/L (ref 3.5–5.1)
Sodium: 132 mmol/L — ABNORMAL LOW (ref 135–145)

## 2016-11-04 LAB — RAPID INFLUENZA A&B ANTIGENS
Influenza A (ARMC): NEGATIVE
Influenza B (ARMC): NEGATIVE

## 2016-11-04 MED ORDER — ALBUTEROL SULFATE (2.5 MG/3ML) 0.083% IN NEBU: 2.5000 mg | INHALATION_SOLUTION | RESPIRATORY_TRACT | Status: AC

## 2016-11-04 MED ORDER — AZITHROMYCIN 500 MG IV SOLR
500.0000 mg | Freq: Once | INTRAVENOUS | Status: AC
  Administered 2016-11-04: 500 mg via INTRAVENOUS

## 2016-11-04 MED ORDER — IPRATROPIUM-ALBUTEROL 0.5-2.5 (3) MG/3ML IN SOLN
3.0000 mL | Freq: Once | RESPIRATORY_TRACT | Status: AC
  Administered 2016-11-04: 3 mL via RESPIRATORY_TRACT

## 2016-11-04 MED ORDER — GABAPENTIN 300 MG PO CAPS
300.0000 mg | ORAL_CAPSULE | Freq: Once | ORAL | Status: AC
  Administered 2016-11-05: 300 mg via ORAL
  Filled 2016-11-04: qty 1

## 2016-11-04 MED ORDER — METHYLPREDNISOLONE SODIUM SUCC 125 MG IJ SOLR
125.0000 mg | Freq: Once | INTRAMUSCULAR | Status: AC
  Administered 2016-11-04: 125 mg via INTRAVENOUS
  Filled 2016-11-04: qty 2

## 2016-11-04 MED ORDER — AZITHROMYCIN 500 MG IV SOLR
INTRAVENOUS | Status: AC
  Filled 2016-11-04: qty 500

## 2016-11-04 MED ORDER — LORAZEPAM 2 MG/ML IJ SOLN
0.5000 mg | Freq: Once | INTRAMUSCULAR | Status: AC
  Administered 2016-11-04: 0.5 mg via INTRAVENOUS
  Filled 2016-11-04: qty 1

## 2016-11-04 MED ORDER — CEFTRIAXONE SODIUM-DEXTROSE 2-2.22 GM-% IV SOLR
2.0000 g | INTRAVENOUS | Status: DC
  Filled 2016-11-04: qty 50

## 2016-11-04 MED ORDER — ALBUTEROL SULFATE (2.5 MG/3ML) 0.083% IN NEBU: 5.0000 mg | INHALATION_SOLUTION | Freq: Once | RESPIRATORY_TRACT | Status: DC

## 2016-11-04 MED ORDER — DEXTROSE 5 % IV SOLN: 2.0000 g | Freq: Once | INTRAVENOUS | Status: DC

## 2016-11-04 MED ORDER — CEFTRIAXONE SODIUM-DEXTROSE 2-2.22 GM-% IV SOLR
2.0000 g | Freq: Once | INTRAVENOUS | Status: AC
  Administered 2016-11-04: 2 g via INTRAVENOUS
  Filled 2016-11-04: qty 50

## 2016-11-04 NOTE — ED Provider Notes (Signed)
[facility] Emergency Department Provider Note  ____________________________________________   First MD Initiated Contact with Patient 11/04/16 2157     (approximate)  I have reviewed the triage vital signs and the nursing notes.   HISTORY  Chief Complaint Shortness of Breath  EM caveat: Patient acutely dyspneic, limiting history  HPI [name] is a 71 y.o. female here for shortness of breath. Has been coughing and short of breath for about a week with wheezing. She reports it feels like having attacked her "COPD"  Patient does report a cough. No known fever. No chest pain or discomfort. Does report feeling short of breath or surrogate walking.  No abdominal pain  Past Medical History:  Diagnosis Date  . Allergy   . Asthma   . Back pain   . Bronchitis   . COPD (chronic obstructive pulmonary disease) ([place])   . Degenerative disc disease, lumbar   . FHx: cholecystectomy   . H/O: hysterectomy   . Hypertension   . Murmur   . Pneumonia 03/31/16   being treated by PCP  . Scoliosis   . Shingles   . Thyroid disease     Patient Active Problem List   Diagnosis Date Noted  . Acute exacerbation of chronic obstructive pulmonary disease (COPD) ([place]) 11/05/2016  . Spinal stenosis, lumbar region, with neurogenic claudication 07/08/2015  . Sacroiliac joint dysfunction 06/28/2015  . Greater trochanteric bursitis 04/10/2015  . Sacroiliac joint disease 03/14/2015  . Facet syndrome, lumbar 03/14/2015  . HTN (hypertension) 03/06/2015  . Thyroid disease 03/06/2015  . DDD (degenerative disc disease), cervical 03/04/2015  . DDD (degenerative disc disease), lumbar 03/04/2015  . DJD (degenerative joint disease) of knee 03/04/2015    Past Surgical History:  Procedure Laterality Date  . ABDOMINAL HYSTERECTOMY    . CHOLECYSTECTOMY    . ROTATOR CUFF REPAIR Right   . SHOULDER OPEN ROTATOR CUFF REPAIR Right   . SHOULDER SURGERY Left    replacement     Prior to Admission medications   Medication Sig Start Date End Date Taking? Authorizing Provider  Albuterol (VENTOLIN IN) Inhale 2 puffs into the lungs as needed.   Yes Historical Provider, MD  alendronate (FOSAMAX) 70 MG tablet Take 70 mg by mouth once a week. Take with a full glass of water on an empty stomach. On Sunday   Yes Historical Provider, MD  amLODipine (NORVASC) 5 MG tablet Take 5 mg by mouth daily.   Yes Historical Provider, MD  Cholecalciferol (VITAMIN D) 2000 UNITS tablet Take 2,000 Units by mouth daily.   Yes Historical Provider, MD  citalopram (CELEXA) 20 MG tablet Take 40 mg by mouth daily.    Yes Historical Provider, MD  gabapentin (NEURONTIN) 300 MG capsule Take 300 mg by mouth 4 (four) times daily.   Yes Historical Provider, MD  levothyroxine (SYNTHROID, LEVOTHROID) 112 MCG tablet Take 112 mcg by mouth daily before breakfast.   Yes Historical Provider, MD  meloxicam (MOBIC) 7.5 MG tablet Take 7.5 mg by mouth daily. Reported on 05/17/2016   Yes Historical Provider, MD  metoprolol (LOPRESSOR) 100 MG tablet Take 100 mg by mouth 2 (two) times daily.   Yes Historical Provider, MD  Multiple Vitamin (MULTIVITAMIN) tablet Take 1 tablet by mouth daily. Reported on 04/05/2016   Yes Historical Provider, MD  omeprazole (PRILOSEC) 20 MG capsule Take 20 mg by mouth daily.   Yes Historical Provider, MD  oxyCODONE (OXY IR/ROXICODONE) 5 MG immediate release tablet Take 5 mg by  mouth every 6 (six) hours as needed for severe pain.   Yes Historical Provider, MD  potassium chloride SA (K-DUR,KLOR-CON) 20 MEQ tablet Take 20 mEq by mouth daily.   Yes Historical Provider, MD  senna-docusate (SENOKOT-S) 8.6-50 MG per tablet Take 1 tablet by mouth daily.   Yes Historical Provider, MD    Allergies Adhesive [tape] and Other  Family History  Problem Relation Age of Onset  . Kidney disease Father   . Heart disease Mother   . Diabetes Mother     Social History Social History  Substance Use  Topics  . Smoking status: Current Every Day Smoker    Packs/day: 1.00    Years: 30.00    Types: Cigarettes  . Smokeless tobacco: Never Used  . Alcohol use No    Review of Systems Constitutional: No fever/chills.  Cardiovascular: Denies chest pain. Respiratory: see history of present illness Gastrointestinal: No abdominal pain.  No nausea, no vomiting.   Musculoskeletal: Negative for back pain. Neurological: Negative for headaches, focal weakness or numbness.  10-point ROS otherwise negative.  ____________________________________________   PHYSICAL EXAM:  VITAL SIGNS: ED Triage Vitals [11/04/16 1841]  Enc Vitals Group     BP (!) 163/84     Pulse Rate (!) 125     Resp (!) 22     Temp 99.4 F (37.4 C)     Temp Source Oral     SpO2 92 %     Weight 174 lb (78.9 kg)     Height '4\' 9"'$  (1.448 m)     Head Circumference      Peak Flow      Pain Score      Pain Loc      Pain Edu?      Excl. in [facility]?    Constitutional: Alert and oriented. Moderate to severe raspatory distress. Eyes: Conjunctivae are normal. PERRL. EOMI. Head: Atraumatic. Nose: No congestion/rhinnorhea. Mouth/Throat: Mucous membranes are moist.  Oropharynx non-erythematous. Neck: No stridor.   Cardiovascular: tachycardic rate, regular rhythm. Grossly normal heart sounds.  Good peripheral circulation. Respiratory: moderate increased work of breathing, moderate accessory muscle use of an expiratory wheezing throughout. No focal rales. Gastrointestinal: Soft and nontender.  Musculoskeletal: No lower extremity tenderness nor edema.  Neurologic:  Normal speech and language. No gross focal neurologic deficits are appreciated. No gait instability. Skin:  Skin is warm, dry and intact. No rash noted. Psychiatric: Mood and affect are normal. Speech and behavior are normal.  ____________________________________________   LABS (all labs ordered are listed, but only abnormal results are displayed)  Labs Reviewed   BASIC METABOLIC PANEL - Abnormal; Notable for the following:       Result Value   Sodium 132 (*)    Chloride 97 (*)    Glucose, Bld 111 (*)    All other components within normal limits  CBC - Abnormal; Notable for the following:    WBC 22.1 (*)    RDW 16.8 (*)    All other components within normal limits  BLOOD GAS, VENOUS - Abnormal; Notable for the following:    pCO2, Ven 40 (*)    All other components within normal limits  RAPID INFLUENZA A&B ANTIGENS ([facility] ONLY)  LACTIC ACID, PLASMA  LACTIC ACID, PLASMA  BLOOD GAS, ARTERIAL  CBC  BASIC METABOLIC PANEL  MAGNESIUM  PHOSPHORUS  PROCALCITONIN   ____________________________________________  EKG  Reviewed and interpreted by me at [facility] Ventricular rate 125 Care is 96 QTc 420 Left bundle branch  block, sinus tachycardia without Scarboas ____________________________________________  RADIOLOGY  Dg Chest 2 View  Result Date: 11/04/2016 CLINICAL DATA:  Short of breath, cough EXAM: CHEST  2 VIEW COMPARISON:  03/06/2015 FINDINGS: Normal cardiac silhouette. Large hiatal hernia. A chronic interstitial pattern not changed. Chronic compression fracture of the lower thoracic spine with kyphosis. Chronic changes of RIGHT shoulder. LEFT shoulder arthroplasty and cervical spine fusion IMPRESSION: 1. No acute findings. 2. Mild interstitial lung disease. 3. Large hiatal hernia Electronically Signed   By: Suzy Bouchard M.D.   On: 11/04/2016 20:02    ____________________________________________   PROCEDURES  Procedure(s) performed: None  Procedures  Critical Care performed: Yes, see critical care note(s)  CRITICAL CARE Performed by: Delman Kitten   Total critical care time: 40 minutes  Critical care time was exclusive of separately billable procedures and treating other patients.  Critical care was necessary to treat or prevent imminent or life-threatening deterioration.  Critical care was time spent personally by me on the  following activities: development of treatment plan with patient and/or surrogate as well as nursing, discussions with consultants, evaluation of patient's response to treatment, examination of patient, obtaining history from patient or surrogate, ordering and performing treatments and interventions, ordering and review of laboratory studies, ordering and review of radiographic studies, pulse oximetry and re-evaluation of patient's condition.  ____________________________________________   INITIAL IMPRESSION / ASSESSMENT AND PLAN / ED COURSE  Pertinent labs & imaging results that were available during my care of the patient were reviewed by me and considered in my medical decision making (see chart for details).  Presents with wheezing, shortness of breath and moderate increased work of breathing. Actually worsened somewhat after nebulizers and placed on BiPAP with good effect. The presentation appears most consistent with COPD exacerbation, given her significant leukocytosis sepsis pneumonia and other acute infectious etiologies also strongly considered. Given the patient's persistent tachycardia, increased rate of breathing requirement for BiPAP the patient was seen by the intensive care unit service will be admitted onto the ICU team.    Clinical Course      ____________________________________________   FINAL CLINICAL IMPRESSION(S) / ED DIAGNOSES  Final diagnoses:  Chronic obstructive pulmonary disease with acute exacerbation (HCC)  Acute bronchitis, unspecified organism  Acute respiratory failure, unspecified whether with hypoxia or hypercapnia ([place])      NEW MEDICATIONS STARTED DURING THIS VISIT:  New Prescriptions   No medications on file     Note:  This document was prepared using Dragon voice recognition software and may include unintentional dictation errors.     Delman Kitten, MD 11/05/16 [phone]

## 2016-11-04 NOTE — ED Notes (Signed)
In at [place] request patient waiting on blood draw

## 2016-11-04 NOTE — ED Notes (Signed)
md notified of pt's chief complaint, assessment and vs. Dr. Jacqualine Code states is coming to see pt next.

## 2016-11-05 ENCOUNTER — Encounter: Payer: Self-pay | Admitting: Adult Health

## 2016-11-05 DIAGNOSIS — Z791 Long term (current) use of non-steroidal anti-inflammatories (NSAID): Secondary | ICD-10-CM | POA: Diagnosis not present

## 2016-11-05 DIAGNOSIS — J441 Chronic obstructive pulmonary disease with (acute) exacerbation: Secondary | ICD-10-CM | POA: Diagnosis present

## 2016-11-05 DIAGNOSIS — J209 Acute bronchitis, unspecified: Secondary | ICD-10-CM | POA: Diagnosis present

## 2016-11-05 DIAGNOSIS — F329 Major depressive disorder, single episode, unspecified: Secondary | ICD-10-CM | POA: Diagnosis present

## 2016-11-05 DIAGNOSIS — Z91048 Other nonmedicinal substance allergy status: Secondary | ICD-10-CM | POA: Diagnosis not present

## 2016-11-05 DIAGNOSIS — J9620 Acute and chronic respiratory failure, unspecified whether with hypoxia or hypercapnia: Secondary | ICD-10-CM | POA: Diagnosis present

## 2016-11-05 DIAGNOSIS — M5136 Other intervertebral disc degeneration, lumbar region: Secondary | ICD-10-CM | POA: Diagnosis present

## 2016-11-05 DIAGNOSIS — R0602 Shortness of breath: Secondary | ICD-10-CM | POA: Diagnosis present

## 2016-11-05 DIAGNOSIS — I10 Essential (primary) hypertension: Secondary | ICD-10-CM | POA: Diagnosis present

## 2016-11-05 DIAGNOSIS — J440 Chronic obstructive pulmonary disease with acute lower respiratory infection: Secondary | ICD-10-CM | POA: Diagnosis present

## 2016-11-05 DIAGNOSIS — F17210 Nicotine dependence, cigarettes, uncomplicated: Secondary | ICD-10-CM | POA: Diagnosis present

## 2016-11-05 DIAGNOSIS — E039 Hypothyroidism, unspecified: Secondary | ICD-10-CM | POA: Diagnosis present

## 2016-11-05 LAB — CBC
HCT: 35.7 % (ref 35.0–47.0)
Hemoglobin: 12 g/dL (ref 12.0–16.0)
MCH: 27.5 pg (ref 26.0–34.0)
MCHC: 33.7 g/dL (ref 32.0–36.0)
MCV: 81.7 fL (ref 80.0–100.0)
Platelets: 240 10*3/uL (ref 150–440)
RBC: 4.36 MIL/uL (ref 3.80–5.20)
RDW: 16.7 % — ABNORMAL HIGH (ref 11.5–14.5)
WBC: 18.4 10*3/uL — ABNORMAL HIGH (ref 3.6–11.0)

## 2016-11-05 LAB — MRSA PCR SCREENING: MRSA by PCR: NEGATIVE

## 2016-11-05 LAB — PROCALCITONIN: Procalcitonin: 0.5 ng/mL

## 2016-11-05 LAB — BLOOD GAS, ARTERIAL
Acid-base deficit: 0 mmol/L (ref 0.0–2.0)
Bicarbonate: 23.8 mmol/L (ref 20.0–28.0)
Delivery systems: POSITIVE
Expiratory PAP: 5
FIO2: 0.3
Inspiratory PAP: 10
O2 Saturation: 90.3 %
Patient temperature: 37
RATE: 10 resp/min
pCO2 arterial: 35 mmHg (ref 32.0–48.0)
pH, Arterial: 7.44 (ref 7.350–7.450)
pO2, Arterial: 57 mmHg — ABNORMAL LOW (ref 83.0–108.0)

## 2016-11-05 LAB — BASIC METABOLIC PANEL
Anion gap: 10 (ref 5–15)
Anion gap: 7 (ref 5–15)
BUN: 9 mg/dL (ref 6–20)
BUN: 9 mg/dL (ref 6–20)
CO2: 23 mmol/L (ref 22–32)
CO2: 26 mmol/L (ref 22–32)
Calcium: 8.5 mg/dL — ABNORMAL LOW (ref 8.9–10.3)
Calcium: 8.4 mg/dL — ABNORMAL LOW (ref 8.9–10.3)
Chloride: 97 mmol/L — ABNORMAL LOW (ref 101–111)
Chloride: 98 mmol/L — ABNORMAL LOW (ref 101–111)
Creatinine, Ser: 0.58 mg/dL (ref 0.44–1.00)
Creatinine, Ser: 0.44 mg/dL (ref 0.44–1.00)
GFR calc Af Amer: >60 mL/min (ref >60)
GFR calc Af Amer: >60 mL/min (ref >60)
GFR calc non Af Amer: >60 mL/min (ref >60)
GFR calc non Af Amer: >60 mL/min (ref >60)
Glucose, Bld: 186 mg/dL — ABNORMAL HIGH (ref 65–99)
Glucose, Bld: 120 mg/dL — ABNORMAL HIGH (ref 65–99)
Potassium: 3.3 mmol/L — ABNORMAL LOW (ref 3.5–5.1)
Potassium: 4.3 mmol/L (ref 3.5–5.1)
Sodium: 130 mmol/L — ABNORMAL LOW (ref 135–145)
Sodium: 131 mmol/L — ABNORMAL LOW (ref 135–145)

## 2016-11-05 LAB — MAGNESIUM: Magnesium: 1.2 mg/dL — ABNORMAL LOW (ref 1.7–2.4)

## 2016-11-05 LAB — GLUCOSE, CAPILLARY
Glucose-Capillary: 182 mg/dL — ABNORMAL HIGH (ref 65–99)
Glucose-Capillary: 178 mg/dL — ABNORMAL HIGH (ref 65–99)

## 2016-11-05 LAB — LACTIC ACID, PLASMA: Lactic Acid, Venous: 2 mmol/L (ref 0.5–1.9)

## 2016-11-05 LAB — PHOSPHORUS: Phosphorus: 1.7 mg/dL — ABNORMAL LOW (ref 2.5–4.6)

## 2016-11-05 MED ORDER — ASPIRIN 300 MG RE SUPP: 300.0000 mg | RECTAL | Status: AC

## 2016-11-05 MED ORDER — POTASSIUM PHOSPHATES 15 MMOLE/5ML IV SOLN
20.0000 mmol | Freq: Once | INTRAVENOUS | Status: AC
  Administered 2016-11-05: 20 mmol via INTRAVENOUS
  Filled 2016-11-05: qty 6.67

## 2016-11-05 MED ORDER — PANTOPRAZOLE SODIUM 40 MG PO TBEC
40.0000 mg | DELAYED_RELEASE_TABLET | Freq: Every day | ORAL | Status: DC
  Administered 2016-11-05 – 2016-11-08 (×4): 40 mg via ORAL
  Filled 2016-11-05 (×4): qty 1

## 2016-11-05 MED ORDER — SENNOSIDES-DOCUSATE SODIUM 8.6-50 MG PO TABS
1.0000 | ORAL_TABLET | Freq: Every day | ORAL | Status: DC
  Administered 2016-11-05 – 2016-11-08 (×4): 1 via ORAL
  Filled 2016-11-05 (×4): qty 1

## 2016-11-05 MED ORDER — ASPIRIN 81 MG PO CHEW
324.0000 mg | CHEWABLE_TABLET | ORAL | Status: AC
  Administered 2016-11-05: 324 mg via ORAL
  Filled 2016-11-05: qty 4

## 2016-11-05 MED ORDER — IPRATROPIUM-ALBUTEROL 0.5-2.5 (3) MG/3ML IN SOLN
3.0000 mL | Freq: Four times a day (QID) | RESPIRATORY_TRACT | Status: DC
  Administered 2016-11-05 – 2016-11-07 (×8): 3 mL via RESPIRATORY_TRACT
  Filled 2016-11-05 (×9): qty 3

## 2016-11-05 MED ORDER — METOPROLOL TARTRATE 50 MG PO TABS
100.0000 mg | ORAL_TABLET | Freq: Two times a day (BID) | ORAL | Status: DC
  Administered 2016-11-05 – 2016-11-08 (×8): 100 mg via ORAL
  Filled 2016-11-05 (×8): qty 2

## 2016-11-05 MED ORDER — METHYLPREDNISOLONE SODIUM SUCC 125 MG IJ SOLR
60.0000 mg | Freq: Two times a day (BID) | INTRAMUSCULAR | Status: DC
  Administered 2016-11-05 – 2016-11-06 (×4): 60 mg via INTRAVENOUS
  Filled 2016-11-05 (×4): qty 2

## 2016-11-05 MED ORDER — CITALOPRAM HYDROBROMIDE 20 MG PO TABS
40.0000 mg | ORAL_TABLET | Freq: Every day | ORAL | Status: DC
  Administered 2016-11-05 – 2016-11-08 (×4): 40 mg via ORAL
  Filled 2016-11-05 (×4): qty 2

## 2016-11-05 MED ORDER — MAGNESIUM SULFATE 4 GM/100ML IV SOLN
4.0000 g | Freq: Once | INTRAVENOUS | Status: AC
  Administered 2016-11-05: 4 g via INTRAVENOUS
  Filled 2016-11-05: qty 100

## 2016-11-05 MED ORDER — BUDESONIDE 0.25 MG/2ML IN SUSP
0.2500 mg | Freq: Two times a day (BID) | RESPIRATORY_TRACT | Status: DC
  Administered 2016-11-05: 0.25 mg via RESPIRATORY_TRACT
  Filled 2016-11-05: qty 2

## 2016-11-05 MED ORDER — OXYCODONE HCL 5 MG PO TABS: 5.0000 mg | ORAL_TABLET | Freq: Four times a day (QID) | ORAL | Status: DC | PRN

## 2016-11-05 MED ORDER — ORAL CARE MOUTH RINSE
15.0000 mL | Freq: Two times a day (BID) | OROMUCOSAL | Status: DC
  Administered 2016-11-06: 15 mL via OROMUCOSAL

## 2016-11-05 MED ORDER — HEPARIN SODIUM (PORCINE) 5000 UNIT/ML IJ SOLN
5000.0000 [IU] | Freq: Three times a day (TID) | INTRAMUSCULAR | Status: DC
  Administered 2016-11-05 – 2016-11-06 (×5): 5000 [IU] via SUBCUTANEOUS
  Filled 2016-11-05 (×5): qty 1

## 2016-11-05 MED ORDER — LEVOTHYROXINE SODIUM 112 MCG PO TABS
112.0000 ug | ORAL_TABLET | Freq: Every day | ORAL | Status: DC
  Administered 2016-11-05 – 2016-11-08 (×4): 112 ug via ORAL
  Filled 2016-11-05 (×4): qty 1

## 2016-11-05 MED ORDER — BUDESONIDE 0.25 MG/2ML IN SUSP
0.2500 mg | Freq: Two times a day (BID) | RESPIRATORY_TRACT | Status: DC
  Administered 2016-11-05 – 2016-11-06 (×2): 0.25 mg via RESPIRATORY_TRACT
  Filled 2016-11-05 (×3): qty 2

## 2016-11-05 MED ORDER — LEVOFLOXACIN IN D5W 750 MG/150ML IV SOLN
750.0000 mg | INTRAVENOUS | Status: DC
  Administered 2016-11-05: 750 mg via INTRAVENOUS
  Filled 2016-11-05 (×2): qty 150

## 2016-11-05 MED ORDER — AMLODIPINE BESYLATE 5 MG PO TABS
5.0000 mg | ORAL_TABLET | Freq: Every day | ORAL | Status: DC
  Administered 2016-11-06 – 2016-11-08 (×3): 5 mg via ORAL
  Filled 2016-11-05 (×4): qty 1

## 2016-11-05 MED ORDER — CHLORHEXIDINE GLUCONATE 0.12 % MT SOLN
15.0000 mL | Freq: Two times a day (BID) | OROMUCOSAL | Status: DC
  Administered 2016-11-05 – 2016-11-06 (×3): 15 mL via OROMUCOSAL
  Filled 2016-11-05 (×2): qty 15

## 2016-11-05 MED ORDER — POTASSIUM CHLORIDE CRYS ER 20 MEQ PO TBCR
40.0000 meq | EXTENDED_RELEASE_TABLET | Freq: Once | ORAL | Status: AC
  Administered 2016-11-05: 40 meq via ORAL
  Filled 2016-11-05: qty 2

## 2016-11-05 MED ORDER — GABAPENTIN 300 MG PO CAPS
300.0000 mg | ORAL_CAPSULE | Freq: Four times a day (QID) | ORAL | Status: DC
  Administered 2016-11-05 – 2016-11-08 (×12): 300 mg via ORAL
  Filled 2016-11-05 (×12): qty 1

## 2016-11-05 MED ORDER — SODIUM CHLORIDE 0.9 % IV SOLN: 250.0000 mL | INTRAVENOUS | Status: DC | PRN

## 2016-11-05 NOTE — Progress Notes (Signed)
Initial Nutrition Assessment  DOCUMENTATION CODES:   Obesity unspecified  INTERVENTION:  1. Monitor and encourage PO Intake, provide ONS if PO intake is poor - but seems she will be ok. 2. Replete electrolytes per pharmacy.  NUTRITION DIAGNOSIS:   Inadequate oral intake related to poor appetite as evidenced by per patient/family report.  GOAL:   Patient will meet greater than or equal to 90% of their needs  MONITOR:   PO intake, I & O's, Labs, Weight trends, Supplement acceptance  REASON FOR ASSESSMENT:   Malnutrition Screening Tool    ASSESSMENT:   This is a 71 year old female with a history of COPD presenting with an acute exacerbation, likely due to an upper respiratory tract infection. However, the possibility of community-acquired pneumonia cannot be completely excluded given her elevated WBC even though her x-ray is currently not showing any infiltrates  Spoke with pt at bedside. She is now off BiPaP on [place] Was eating lunch during my visit. Mac and Cheese, Pork Loin, [employer]. Was doing well. She reports for the last 2-3 weeks poor appetite consuming 2 meals per day consisting mostly of snacks - cookies, snack cakes. Reports 10# wt loss during that time. Per chart and care everywhere she seems to exhibit a gradual 10# wt loss over 4 months Nutrition-Focused physical exam completed. Findings are no  fat depletion, mild muscle depletion, and no edema.   She was agreeable to try Ensure if her PO intake was poor. Will monitor and follow up as needed. Likes chocolate.  Labs and medications: CBGs 178-182, Na 130, K 3.3, Phos 1.7, Mg 1.2,    Diet Order:  Diet regular Room service appropriate? Yes; Fluid consistency: Thin  Skin:  Reviewed, no issues  Last BM:  PTA  Height:   Ht Readings from Last 1 Encounters:  11/05/16 _0  (1.448 m)    Weight:   Wt Readings from Last 1 Encounters:  11/05/16 170 lb 6.7 oz (77.3 kg)    Ideal Body Weight:  38.63 kg  BMI:   Body mass index is 36.88 kg/m.  Estimated Nutritional Needs:   Kcal:  1400-1650 calories  Protein:  77-92 gm  Fluid:  >/= 1.4L  EDUCATION NEEDS:   No education needs identified at this time  Satira Anis. Gerrie Castiglia, MS, RD LDN Inpatient Clinical Dietitian Pager [phone]

## 2016-11-05 NOTE — Progress Notes (Signed)
Patient transferred to room 241. No pain or distress. Patient walked with standby from wheelchair to bed. Alarm on. Skin assessment with Serenity RN. Tele box called in with Gerald Stabs NT [id number]. Focused assessment as charted. Will continue to monitor.

## 2016-11-05 NOTE — Plan of Care (Signed)
Problem: Safety: Goal: Ability to remain free from injury will improve Outcome: Progressing Bed alarm on. Educated on safety. Socks on. Agrees to call before getting out of bed.

## 2016-11-05 NOTE — ED Notes (Signed)
Pt appears slightly restless at times. Resting quietly with eyes closed. husband at the bedside.

## 2016-11-05 NOTE — Progress Notes (Signed)
Pt has remained alert and oriented with no c/o pain. NSR on cardiac monitor. Lung sounds with bilat exp wheezes-per pt this is basline. RR even and unlabored. Pt has transitioned from 35% bipap to [facility] over the course of this shift. Family at bedside. Pt has orders to transfer to telemetry.

## 2016-11-05 NOTE — Progress Notes (Signed)
[facility] [place] Progress Note Patient Name: [name] DOB: [birth year] MRN: [id number]   Date of Service  11/05/2016  HPI/Events of Note  Nursing requesting transfer, Dr Rogelia Mire recs telemetry   eICU Interventions  Transfer to telemetry      Intervention Category Minor Interventions: Routine modifications to care plan (e.g. PRN medications for pain, fever)  Christinia Gully 11/05/2016, 4:55 PM

## 2016-11-05 NOTE — Progress Notes (Signed)
MEDICATION RELATED CONSULT NOTE - INITIAL   Pharmacy Consult for electrolyte replacement Indication: hypokalemia  Allergies  Allergen Reactions  . Adhesive [Tape] Other (See Comments)  . Other Rash    Plastic tape    Patient Measurements: Height: '4\' 9"'$  (144.8 cm) Weight: 170 lb 6.7 oz (77.3 kg) IBW/kg (Calculated) : 38.6 Adjusted Body Weight:   Vital Signs: Temp: 98.1 F (36.7 C) (01/07 1828) Temp Source: Oral (01/07 1828) BP: 116/91 (01/07 1828) Pulse Rate: 84 (01/07 1828) Intake/Output from previous day: 01/06 0701 - 01/07 0700 In: 760 [P.O.:60; IV Piggyback:700] Out: 350 [Urine:350] Intake/Output from this shift: Total I/O In: 340 [P.O.:240; IV Piggyback:100] Out: 300 [Urine:300]  Labs:  Recent Labs  11/04/16 1842 11/05/16 0114 11/05/16 1709  WBC 22.1* 18.4*  --   HGB 12.9 12.0  --   HCT 39.4 35.7  --   PLT 256 240  --   CREATININE 0.57 0.58 0.44  MG  --  1.2*  --   PHOS  --  1.7*  --    Estimated Creatinine Clearance: 56.7 mL/min (by C-G formula based on SCr of 0.44 mg/dL).   Microbiology: Recent Results (from the past 720 hour(s))  Rapid Influenza A&B Antigens ([place] only)     Status: None   Collection Time: 11/04/16  9:47 PM  Result Value Ref Range Status   Influenza A ([place]) NEGATIVE NEGATIVE Final   Influenza B ([facility]) NEGATIVE NEGATIVE Final  MRSA PCR Screening     Status: None   Collection Time: 11/05/16  2:03 AM  Result Value Ref Range Status   MRSA by PCR NEGATIVE NEGATIVE Final    Comment:        The GeneXpert MRSA Assay (FDA approved for NASAL specimens only), is one component of a comprehensive MRSA colonization surveillance program. It is not intended to diagnose MRSA infection nor to guide or monitor treatment for MRSA infections.     Medical History: Past Medical History:  Diagnosis Date  . Allergy   . Asthma   . Back pain   . Bronchitis   . COPD (chronic obstructive pulmonary disease) ([place])   . Degenerative disc  disease, lumbar   . Depression   . FHx: cholecystectomy   . H/O: hysterectomy   . Hypertension   . Murmur   . Pneumonia 03/31/16   being treated by PCP  . Restless legs syndrome   . Scoliosis   . Shingles   . Thyroid disease     Medications:  Infusions:    Assessment: 70 yof cc SOB. Feeling much better per RN note, but electrolytes need replacing this AM. Pharmacy consulted to replace electrolytes.  Goal of Therapy:  K 3.5 to 5 Mg 1.8 to 2.4 Phos 2.4 to 4.6  Plan:  K is WNL (4.3) after repleation, will recheck along with Mg and phos in the AM.  Ramond Dial, Pharm.D., BCPS Clinical Pharmacist 11/05/2016,6:30 PM

## 2016-11-05 NOTE — H&P (Signed)
PULMONARY / CRITICAL CARE MEDICINE   Name: [name] MRN: [id number] DOB: 10-24-1946    ADMISSION DATE:  11/04/2016   REFERRING MD:  Dr. Jacqualine Code  CHIEF COMPLAINT:  Shortness of breath  HISTORY OF PRESENT ILLNESS:   This is a 71 y/o female with a PMH of COPD/asthma, hypothyroidism, hypertension and chronic back pain who presents with worsening shortness of breath. She states that symptoms started about a week ago, initially with a cough and then progressed to cough with shortness of breath, and wheezing. Cough is mildly productive with yellowish sputum. She states that this is how she usually feels when she is having an acute exacerbation of her COPD. She denies fever, chills, nausea, vomiting, dizziness, and chest pain. She denies any sick contacts. Rapid influenza test is negative. X-rays unremarkable. However, her WBC is elevated at 21,000. She is being admitted for acute COPD exacerbation and URI.  PAST MEDICAL HISTORY :  She  has a past medical history of Allergy; Asthma; Back pain; Bronchitis; COPD (chronic obstructive pulmonary disease) ([place]); Degenerative disc disease, lumbar; FHx: cholecystectomy; H/O: hysterectomy; Hypertension; Murmur; Pneumonia (03/31/16); Scoliosis; Shingles; and Thyroid disease.  PAST SURGICAL HISTORY: She  has a past surgical history that includes Rotator cuff repair (Right); Abdominal hysterectomy; Cholecystectomy; Shoulder open rotator cuff repair (Right); and Shoulder surgery (Left).  Allergies  Allergen Reactions  . Adhesive [Tape] Other (See Comments)  . Other Rash    Plastic tape    No current facility-administered medications on file prior to encounter.    Current Outpatient Prescriptions on File Prior to Encounter  Medication Sig  . Albuterol (VENTOLIN IN) Inhale 2 puffs into the lungs as needed.  Marland Kitchen alendronate (FOSAMAX) 70 MG tablet Take 70 mg by mouth once a week. Take with a full glass of water on an empty stomach. On Sunday  . amLODipine  (NORVASC) 5 MG tablet Take 5 mg by mouth daily.  . Cholecalciferol (VITAMIN D) 2000 UNITS tablet Take 2,000 Units by mouth daily.  . citalopram (CELEXA) 20 MG tablet Take 40 mg by mouth daily.   Marland Kitchen levothyroxine (SYNTHROID, LEVOTHROID) 112 MCG tablet Take 112 mcg by mouth daily before breakfast.  . meloxicam (MOBIC) 7.5 MG tablet Take 7.5 mg by mouth daily. Reported on 05/17/2016  . metoprolol (LOPRESSOR) 100 MG tablet Take 100 mg by mouth 2 (two) times daily.  . Multiple Vitamin (MULTIVITAMIN) tablet Take 1 tablet by mouth daily. Reported on 04/05/2016  . omeprazole (PRILOSEC) 20 MG capsule Take 20 mg by mouth daily.  . potassium chloride SA (K-DUR,KLOR-CON) 20 MEQ tablet Take 20 mEq by mouth daily.  Marland Kitchen senna-docusate (SENOKOT-S) 8.6-50 MG per tablet Take 1 tablet by mouth daily.    FAMILY HISTORY:  Her indicated that her mother is deceased. She indicated that her father is deceased.    SOCIAL HISTORY: She  reports that she has been smoking Cigarettes.  She has a 30.00 pack-year smoking history. She has never used smokeless tobacco. She reports that she does not drink alcohol or use drugs.  REVIEW OF SYSTEMS:   Limited as patient is on BiPAP Constitutional: Negative for fever and chills.  HENT: Negative for congestion and rhinorrhea.  Eyes: Negative for redness and visual disturbance.  Respiratory: Positive for shortness of breath, cough and wheezing.  Cardiovascular: Negative for chest pain and palpitations.  Gastrointestinal: Negative  for nausea , vomiting and abdominal pain and  Loose stools Genitourinary: Negative for dysuria and urgency.  Endocrine: Denies polyuria, polyphagia and  heat intolerance Musculoskeletal: Negative for myalgias and arthralgias.  Skin: Negative for pallor and wound.  Neurological: Negative for dizziness and headaches   SUBJECTIVE:   VITAL SIGNS: BP 123/76   Pulse 78   Temp 99.1 F (37.3 C) (Axillary)   Resp (!) 21   Ht '4\' 9"'$  (1.448 m)   Wt 77.3 kg  (170 lb 6.7 oz)   SpO2 99%   BMI 36.88 kg/m   HEMODYNAMICS:    VENTILATOR SETTINGS: FiO2 (%):  [30 %] 30 %  INTAKE / OUTPUT: No intake/output data recorded.  PHYSICAL EXAMINATION: General: Well-nourished, well-developed, in moderate distress Neuro: Alert and oriented to person, place and time, follows commands, speech is normal, cranial nerves intact HEENT: Head is normocephalic and atraumatic, he had nasal passages patent, no discharge, oral mucosa dry, gag reflex intact, trachea midline Cardiovascular: RRR, mildly tachycardic, S1, S2, no murmur, regurg or gallop Lungs: Moderately increased work of breathing, diminished breath sounds in all lungs fields, positive expiratory wheezes in anterior lung fields, no rhonchi or Rales Abdomen: Soft, nondistended, positive bowel sounds in all 4 quadrants, palpation reveals no organomegaly Musculoskeletal: No visible joint deformities, no joint tenderness on palpation Extremities: No edema, mild venostasis discoloration, +2 pulses bilaterally Skin:  Warm, dry and intact  LABS:  BMET  Recent Labs Lab 11/04/16 1842 11/05/16 0114  NA 132* 130*  K 3.9 3.3*  CL 97* 97*  CO2 26 23  BUN 10 9  CREATININE 0.57 0.58  GLUCOSE 111* 186*    Electrolytes  Recent Labs Lab 11/04/16 1842 11/05/16 0114  CALCIUM 9.3 8.5*  MG  --  1.2*  PHOS  --  1.7*    CBC  Recent Labs Lab 11/04/16 1842 11/05/16 0114  WBC 22.1* 18.4*  HGB 12.9 12.0  HCT 39.4 35.7  PLT 256 240    Coag's No results for input(s): APTT, INR in the last 168 hours.  Sepsis Markers  Recent Labs Lab 11/04/16 1842 11/04/16 2147 11/05/16 0114  LATICACIDVEN  --  1.7 2.0*  PROCALCITON 0.50  --   --     ABG  Recent Labs Lab 11/05/16 0113  PHART 7.44  PCO2ART 35  PO2ART 57*    Liver Enzymes No results for input(s): AST, ALT, ALKPHOS, BILITOT, ALBUMIN in the last 168 hours.  Cardiac Enzymes No results for input(s): TROPONINI, PROBNP in the last 168  hours.  Glucose  Recent Labs Lab 11/05/16 0116 11/05/16 0202  GLUCAP 182* 178*    Imaging Dg Chest 2 View  Result Date: 11/04/2016 CLINICAL DATA:  Short of breath, cough EXAM: CHEST  2 VIEW COMPARISON:  03/06/2015 FINDINGS: Normal cardiac silhouette. Large hiatal hernia. A chronic interstitial pattern not changed. Chronic compression fracture of the lower thoracic spine with kyphosis. Chronic changes of RIGHT shoulder. LEFT shoulder arthroplasty and cervical spine fusion IMPRESSION: 1. No acute findings. 2. Mild interstitial lung disease. 3. Large hiatal hernia Electronically Signed   By: Suzy Bouchard M.D.   On: 11/04/2016 20:02    STUDIES:  None  CULTURES:  rapid influenza negative  ANTIBIOTICS: Ceftriaxone and azithromycin given in the ED Levaquin 750 mg by mouth daily 5 days to be started 11/05/2016  SIGNIFICANT EVENTS: 11/04/2016: ED with acute COPD exacerbation, admitted  LINES/TUBES: Peripheral IVs  DISCUSSION: This is a 71 year old female with a history of COPD presenting with an acute exacerbation, likely due to an upper respiratory tract infection. However, the possibility of community-acquired pneumonia cannot be completely excluded given her elevated  WBC even though her x-ray is currently not showing any infiltrates  ASSESSMENT / PLAN:  PULMONARY A: Acute COPD exacerbation Community-acquired pneumonia versus URI P:   Daily chest x-ray and ABG as needed. Nebulized bronchodilators and steroids. IV steroids Will discontinue ceftriaxone and azithromycin and start patient on oral Levaquin 5 days Continue BiPAP with current settings and titrate off as tolerated  CARDIOVASCULAR A:  History of hypertension P:  Hemodynamic monitoring per ICU protocol.  Continue home blood pressure medications.  RENAL A:   No acute issues P:   Monitor and replace electrolytes as needed  GASTROINTESTINAL A:   No acute issues P:   Protonix 40 mg daily for GI  prophylaxis while nothing by mouth on BiPAP  HEMATOLOGIC A:   No acute issues P:  Heparin and SCDs for DVT prophylaxis  INFECTIOUS A:   Leukocytosis-WBC trending down Community-acquired pneumonia versus upper respiratory tract infection-influenza swab negative P:   We'll change antibiotics to Levaquin orally. Panculture if febrile  ENDOCRINE A:   No acute issues P:   Monitor blood glucose with BMPs  NEUROLOGIC A:   No acute issues P:   RASS goal: Nonapplicable   FAMILY  - Updates: No family at bedside. We'll updated when available  - Inter-disciplinary family meet or Palliative Care meeting due by:  day 7  Plan of care discussed with [facility] physician and Dr. Ashby Dawes  Total critical care time 45 minutes  Myrian Botello S. [facility] ANP-BC Pulmonary and Critical Care Medicine [facility] Pager [phone] or [phone] 11/05/2016, 6:33 AM

## 2016-11-05 NOTE — Progress Notes (Signed)
MEDICATION RELATED CONSULT NOTE - INITIAL   Pharmacy Consult for electrolyte replacement Indication: hypokalemia  Allergies  Allergen Reactions  . Adhesive [Tape] Other (See Comments)  . Other Rash    Plastic tape    Patient Measurements: Height: '4\' 9"'$  (144.8 cm) Weight: 170 lb 6.7 oz (77.3 kg) IBW/kg (Calculated) : 38.6 Adjusted Body Weight:   Vital Signs: Temp: 99.1 F (37.3 C) (01/07 0200) Temp Source: Axillary (01/07 0200) BP: 123/76 (01/07 0600) Pulse Rate: 78 (01/07 0600) Intake/Output from previous day: 01/06 0701 - 01/07 0700 In: 260 [P.O.:60; IV Piggyback:200] Out: 350 [Urine:350] Intake/Output from this shift: Total I/O In: 260 [P.O.:60; IV Piggyback:200] Out: 350 [Urine:350]  Labs:  Recent Labs  11/04/16 1842 11/05/16 0114  WBC 22.1* 18.4*  HGB 12.9 12.0  HCT 39.4 35.7  PLT 256 240  CREATININE 0.57 0.58  MG  --  1.2*  PHOS  --  1.7*   Estimated Creatinine Clearance: 56.7 mL/min (by C-G formula based on SCr of 0.58 mg/dL).   Microbiology: Recent Results (from the past 720 hour(s))  Rapid Influenza A&B Antigens ([place] only)     Status: None   Collection Time: 11/04/16  9:47 PM  Result Value Ref Range Status   Influenza A ([place]) NEGATIVE NEGATIVE Final   Influenza B ([facility]) NEGATIVE NEGATIVE Final  MRSA PCR Screening     Status: None   Collection Time: 11/05/16  2:03 AM  Result Value Ref Range Status   MRSA by PCR NEGATIVE NEGATIVE Final    Comment:        The GeneXpert MRSA Assay (FDA approved for NASAL specimens only), is one component of a comprehensive MRSA colonization surveillance program. It is not intended to diagnose MRSA infection nor to guide or monitor treatment for MRSA infections.     Medical History: Past Medical History:  Diagnosis Date  . Allergy   . Asthma   . Back pain   . Bronchitis   . COPD (chronic obstructive pulmonary disease) ([place])   . Degenerative disc disease, lumbar   . FHx: cholecystectomy   . H/O:  hysterectomy   . Hypertension   . Murmur   . Pneumonia 03/31/16   being treated by PCP  . Scoliosis   . Shingles   . Thyroid disease     Medications:  Infusions:    Assessment: 70 yof cc SOB. Feeling much better per RN note, but electrolytes need replacing this AM. Pharmacy consulted to replace electrolytes.  Goal of Therapy:  K 3.5 to 5 Mg 1.8 to 2.4 Phos 2.4 to 4.6  Plan:  Magnesium sulfate 4 gm IV x 1, potassium phosphate 20 mmol IV x 1, potassium chloride 40 mEq po x 1. Will recheck BMP this evening and all electrolytes with AM labs.  Laural Benes, Pharm.D., BCPS Clinical Pharmacist 11/05/2016,6:11 AM

## 2016-11-05 NOTE — ED Notes (Signed)
Pt resting on stretcher with eyes closed. States she is feeling much better. Husband at the bedside attentive to pt needs.

## 2016-11-06 LAB — BASIC METABOLIC PANEL
Anion gap: 8 (ref 5–15)
BUN: 11 mg/dL (ref 6–20)
CO2: 26 mmol/L (ref 22–32)
Calcium: 8.6 mg/dL — ABNORMAL LOW (ref 8.9–10.3)
Chloride: 97 mmol/L — ABNORMAL LOW (ref 101–111)
Creatinine, Ser: 0.44 mg/dL (ref 0.44–1.00)
GFR calc Af Amer: >60 mL/min (ref >60)
GFR calc non Af Amer: >60 mL/min (ref >60)
Glucose, Bld: 119 mg/dL — ABNORMAL HIGH (ref 65–99)
Potassium: 4.4 mmol/L (ref 3.5–5.1)
Sodium: 131 mmol/L — ABNORMAL LOW (ref 135–145)

## 2016-11-06 LAB — PROCALCITONIN: Procalcitonin: <0.1 ng/mL

## 2016-11-06 LAB — MAGNESIUM: Magnesium: 2.2 mg/dL (ref 1.7–2.4)

## 2016-11-06 LAB — PHOSPHORUS: Phosphorus: 1.9 mg/dL — ABNORMAL LOW (ref 2.5–4.6)

## 2016-11-06 MED ORDER — ALUM & MAG HYDROXIDE-SIMETH 200-200-20 MG/5ML PO SUSP
30.0000 mL | Freq: Four times a day (QID) | ORAL | Status: DC | PRN
  Administered 2016-11-06: 30 mL via ORAL
  Filled 2016-11-06: qty 30

## 2016-11-06 MED ORDER — BUDESONIDE 0.5 MG/2ML IN SUSP
0.5000 mg | Freq: Two times a day (BID) | RESPIRATORY_TRACT | Status: DC
  Administered 2016-11-06 – 2016-11-07 (×2): 0.5 mg via RESPIRATORY_TRACT
  Filled 2016-11-06 (×2): qty 2

## 2016-11-06 MED ORDER — METHYLPREDNISOLONE SODIUM SUCC 40 MG IJ SOLR
40.0000 mg | Freq: Two times a day (BID) | INTRAMUSCULAR | Status: DC
  Administered 2016-11-07 (×2): 40 mg via INTRAVENOUS
  Filled 2016-11-06 (×2): qty 1

## 2016-11-06 MED ORDER — ENOXAPARIN SODIUM 40 MG/0.4ML ~~LOC~~ SOLN
40.0000 mg | SUBCUTANEOUS | Status: DC
  Administered 2016-11-06 – 2016-11-07 (×2): 40 mg via SUBCUTANEOUS
  Filled 2016-11-06 (×2): qty 0.4

## 2016-11-06 MED ORDER — ALBUTEROL SULFATE (2.5 MG/3ML) 0.083% IN NEBU: 2.5000 mg | INHALATION_SOLUTION | RESPIRATORY_TRACT | Status: DC | PRN

## 2016-11-06 MED ORDER — DOXYCYCLINE HYCLATE 100 MG PO TABS
100.0000 mg | ORAL_TABLET | Freq: Two times a day (BID) | ORAL | Status: DC
  Administered 2016-11-06 – 2016-11-08 (×5): 100 mg via ORAL
  Filled 2016-11-06 (×5): qty 1

## 2016-11-06 NOTE — Progress Notes (Signed)
A&O Up with assist. CO indigestion during the night. Received order from Dr. Estanislado Pandy for maalox however patient went back to sleep before receiving medication. WIll continue to monitor. On 3L O2. IV antibiotics given.

## 2016-11-06 NOTE — Care Management Important Message (Signed)
Important Message  Patient Details  Name: [name] MRN: [id number] Date of Birth: [birth year]   Medicare Important Message Given:  Yes Initial signed IM printed from Epic and given to patient.    Katrina Stack, RN 11/06/2016, 9:03 AM

## 2016-11-06 NOTE — Care Management (Signed)
Patient transferred to 2A 1/7.  She is admitted from home with exacerbation of copd.  She had required continuous bipap but now on nasal cannula.  need for 02 is acute.  Discussed the need to assess for home 02 and mobilization of patient

## 2016-11-07 DIAGNOSIS — J441 Chronic obstructive pulmonary disease with (acute) exacerbation: Principal | ICD-10-CM

## 2016-11-07 LAB — PROCALCITONIN: Procalcitonin: <0.1 ng/mL

## 2016-11-07 MED ORDER — FLUTICASONE FUROATE-VILANTEROL 200-25 MCG/INH IN AEPB
1.0000 | INHALATION_SPRAY | Freq: Every day | RESPIRATORY_TRACT | Status: DC
  Filled 2016-11-07: qty 28

## 2016-11-07 MED ORDER — TIOTROPIUM BROMIDE MONOHYDRATE 18 MCG IN CAPS
18.0000 ug | ORAL_CAPSULE | Freq: Every day | RESPIRATORY_TRACT | Status: DC
  Filled 2016-11-07: qty 5

## 2016-11-07 MED ORDER — UMECLIDINIUM-VILANTEROL 62.5-25 MCG/INH IN AEPB
1.0000 | INHALATION_SPRAY | Freq: Every day | RESPIRATORY_TRACT | Status: DC
  Administered 2016-11-07 – 2016-11-08 (×2): 1 via RESPIRATORY_TRACT
  Filled 2016-11-07: qty 14

## 2016-11-07 MED ORDER — PREDNISONE 50 MG PO TABS
60.0000 mg | ORAL_TABLET | Freq: Every day | ORAL | Status: DC
  Administered 2016-11-08: 60 mg via ORAL
  Filled 2016-11-07: qty 1

## 2016-11-07 NOTE — Progress Notes (Signed)
No new complaints Improving SOB Doesn't yet feel ready to go home  Vitals:   11/07/16 0446 11/07/16 0805 11/07/16 1113 11/07/16 1134  BP: (!) 156/99 136/75 (!) 145/78 131/84  Pulse: 74 72 (!) 59 66  Resp:   19 20  Temp: 98.3 F (36.8 C) 98.4 F (36.9 C) 98 F (36.7 C) 98.3 F (36.8 C)  TempSrc: Oral Oral Oral Oral  SpO2: 93% 93% 92% (!) 89%  Weight:      Height:       NAD HEENT WNL No JVD Few scattered wheezes Reg, no M NABS No C/C/E  BMP Latest Ref Rng & Units 11/06/2016 11/05/2016 11/05/2016  Glucose 65 - 99 mg/dL 119(H) 120(H) 186(H)  BUN 6 - 20 mg/dL '11 9 9  '$ Creatinine 0.44 - 1.00 mg/dL 0.44 0.44 0.58  Sodium 135 - 145 mmol/L 131(L) 131(L) 130(L)  Potassium 3.5 - 5.1 mmol/L 4.4 4.3 3.3(L)  Chloride 101 - 111 mmol/L 97(L) 98(L) 97(L)  CO2 22 - 32 mmol/L '26 26 23  '$ Calcium 8.9 - 10.3 mg/dL 8.6(L) 8.4(L) 8.5(L)   CBC Latest Ref Rng & Units 11/05/2016 11/04/2016 03/07/2015  WBC 3.6 - 11.0 K/uL 18.4(H) 22.1(H) 22.9(H)  Hemoglobin 12.0 - 16.0 g/dL 12.0 12.9 12.0  Hematocrit 35.0 - 47.0 % 35.7 39.4 37.5  Platelets 150 - 440 K/uL 240 256 247   No new CXR  IMPRESSION: Acute resp failure AECOPD Smoker  PLAN/REC: Counseled re smoking cessation Cont supplemental O2 - assess O2 needs prior to discharge Cont systemic steroids - transition to prednisone after today Transition to maintenance ICS/LABA and LAMA Cont empiric antibiotics - doxycycline Anticipate DC home 01/10 - will plan on Anoro as maintenance inhaler and will likely need long term O2   Merton Border, MD PCCM service Mobile [phone] Pager [phone] 11/07/2016

## 2016-11-08 MED ORDER — DOXYCYCLINE HYCLATE 50 MG PO CAPS: 100.0000 mg | ORAL_CAPSULE | Freq: Two times a day (BID) | ORAL | 0 refills | Status: DC

## 2016-11-08 MED ORDER — PREDNISONE 20 MG PO TABS: ORAL_TABLET | ORAL | 0 refills | Status: DC

## 2016-11-08 MED ORDER — UMECLIDINIUM-VILANTEROL 62.5-25 MCG/INH IN AEPB: 1.0000 | INHALATION_SPRAY | Freq: Every day | RESPIRATORY_TRACT | 1 refills | Status: DC

## 2016-11-08 NOTE — Care Management (Signed)
Patient is for discharge home today.  She declines the need for home health nurse several times.  Discussed that Husband is also an inpatient and if she is returning home alone, may benefit from having assessment by a home registered nurse.  She declines.  She did not qualify for continuous home 02.   She is to discharge home on Anoro Ellipta inhaler.  Her copay for today's script will be 142.00.  Patient aware and discussed may be lower after she meets her deductible.  she will be given the Anoro inhaler she has been using while inpatient. Her granddaughter will transport her home

## 2016-11-08 NOTE — Discharge Summary (Signed)
Physician Discharge Summary  Patient ID: [name] MRN: [id number] DOB/AGE: 1946-09-20 71 y.o.  Admit date: 11/04/2016 Discharge date: 11/08/2016    Discharge Diagnoses:        Acute COPD Exacerbation-resolved       COPD       Upper Respiratory Infection       Hypertension       Leukocytosis secondary to Upper Respiratory Infection       Thyroid Disease                                                                      DISCHARGE PLAN BY DIAGNOSIS    COPD and Asthma Plan: -Start maintenance (Anoro Ellipta) 62.5-25 mcg/inh 1 puff daily -Continue prn Ventolin IN -Prednisone taper 40 mg daily for 3 days, then 20 mg for 3 days -Smoking cessation counseling provided -Avoid triggers for asthma exacerbation  -Follow-up with Pulmonologist Dr. Lora Havens in 3 to 4 weeks  Upper Respiratory Infection Leukocytosis secondary to Upper Respiratory Infection Plan:  -Continue Doxycycline for 4 more days  -Prednisone taper 40 mg daily for 3 days, then 20 mg for 3 days -Educated about importance of hand hygiene -Instructed to notify PCP if symptoms worsen or persist  Hypertension Plan: -Continue Amlodipine and Metoprolol -Restrict sodium intake   Thyroid Disease -Continue Synthroid  Depression -Continue Citalopram                DISCHARGE SUMMARY   [name] is a 71 y.o. y/o female with a PMH of COPD, Bronchitis, Asthma, Allergies, Thyroid Disease, Pneumonia, Restless leg syndrome, Hypertension, Depression, Back pain, Heart Murmur, Shingles, and Scoliosis.  She presented to [facility] ER 01/6 with c/o shortness of breath, wheezing, and productive cough with yellow sputum onset of symptoms 1 week prior to presentation to the ER.  In the ER her WBC was elevated at 21,000.  She was admitted to ICU with acute on chronic respiratory failure secondary to AECOPD and Upper Respiratory Infection then transferred to the telemetry unit 01/8.    SIGNIFICANT DIAGNOSTIC STUDIES None    SIGNIFICANT EVENTS 01/6-Pt admitted with acute on chronic respiratory failure secondary to AECOPD and Upper Respiratory Infection  MICRO DATA  Rapid Influenza A&B>>negative   ANTIBIOTICS Ceftriaxone 01/6>>x1 dose in ER Azithromycin 01/6>>x1 dose in ER  Levaquin 01/7>>01/8 Doxycycline 01/8>>   CONSULTS Intensivist  TUBES / LINES PIV x1 01/6>>   Discharge Exam: General: well developed, well nourished, resting in bed, in NAD. Neuro: A&O x 3, non-focal.  HEENT: [place]/AT. PERRL, sclerae anicteric. Cardiovascular: RRR, no M/R/G.  Lungs: inspiratory wheezes throughout, respirations even and unlabored Abdomen: BS x 4, soft, NT/ND.  Musculoskeletal: No gross deformities, no edema.  Skin: Intact, warm, no rashes.   Vitals:   11/08/16 0532 11/08/16 0833 11/08/16 0930 11/08/16 0935  BP: (!) 151/79 (!) 149/80    Pulse: (!) 57 68    Resp: 17 18    Temp: 98 F (36.7 C) 98.1 F (36.7 C)    TempSrc: Oral Oral    SpO2: 91% 91% 92% 93%  Weight:      Height:         Discharge Labs  BMET  Recent Labs Lab 11/04/16 1842 11/05/16 0114 11/05/16 1709  11/06/16 0453  NA 132* 130* 131* 131*  K 3.9 3.3* 4.3 4.4  CL 97* 97* 98* 97*  CO2 _0 GLUCOSE 111* 186* 120* 119*  BUN _1 CREATININE 0.57 0.58 0.44 0.44  CALCIUM 9.3 8.5* 8.4* 8.6*  MG  --  1.2*  --  2.2  PHOS  --  1.7*  --  1.9*    CBC  Recent Labs Lab 11/04/16 1842 11/05/16 0114  HGB 12.9 12.0  HCT 39.4 35.7  WBC 22.1* 18.4*  PLT 256 240    Anti-Coagulation No results for input(s): INR in the last 168 hours.        Allergies as of 11/08/2016      Reactions   Adhesive [tape] Other (See Comments)   Other Rash   Plastic tape      Medication List    TAKE these medications   alendronate 70 MG tablet Commonly known as:  FOSAMAX Take 70 mg by mouth once a week. Take with a full glass of water on an empty stomach. On Sunday   amLODipine 5 MG tablet Commonly known as:  NORVASC Take  5 mg by mouth daily.   citalopram 20 MG tablet Commonly known as:  CELEXA Take 40 mg by mouth daily.   doxycycline 50 MG capsule Commonly known as:  VIBRAMYCIN Take 2 capsules (100 mg total) by mouth 2 (two) times daily.   gabapentin 300 MG capsule Commonly known as:  NEURONTIN Take 300 mg by mouth 4 (four) times daily.   levothyroxine 112 MCG tablet Commonly known as:  SYNTHROID, LEVOTHROID Take 112 mcg by mouth daily before breakfast.   meloxicam 7.5 MG tablet Commonly known as:  MOBIC Take 7.5 mg by mouth daily. Reported on 05/17/2016   metoprolol 100 MG tablet Commonly known as:  LOPRESSOR Take 100 mg by mouth 2 (two) times daily.   multivitamin tablet Take 1 tablet by mouth daily. Reported on 04/05/2016   omeprazole 20 MG capsule Commonly known as:  PRILOSEC Take 20 mg by mouth daily.   oxyCODONE 5 MG immediate release tablet Commonly known as:  Oxy IR/ROXICODONE Take 5 mg by mouth every 6 (six) hours as needed for severe pain.   potassium chloride SA 20 MEQ tablet Commonly known as:  K-DUR,KLOR-CON Take 20 mEq by mouth daily.   predniSONE 20 MG tablet Commonly known as:  DELTASONE Take 2 tablets for 3 days, then take 1 tablet for 3 days   senna-docusate 8.6-50 MG tablet Commonly known as:  Senokot-S Take 1 tablet by mouth daily.   umeclidinium-vilanterol 62.5-25 MCG/INH Aepb Commonly known as:  ANORO ELLIPTA Inhale 1 puff into the lungs daily.   VENTOLIN IN Inhale 2 puffs into the lungs as needed.   Vitamin D 2000 units tablet Take 2,000 Units by mouth daily.        Disposition: Pt stable for discharge home.  At this time she does not need home O2 ambulatory pulse oximetry prior to discharge on RA 92% and at rest on RA 93%  Discharged Condition: [name] has met maximum benefit of inpatient care and is medically stable and cleared for discharge.  Patient is pending follow up as above.     Marda Stalker, [place] Pager  [phone] (please enter 7 [phone]) PCCM Consult Pager [phone] (please enter 7 digits)   PCCM ATTENDING I have seen and examined patient and agree with above  Merton Border, MD PCCM  service Mobile [phone] Pager [phone] 11/08/2016

## 2016-11-08 NOTE — Progress Notes (Signed)
Patient is discharge home in a stable condition, on room air , no distress noted at time of distress, summary and f/u care given to both pt and grand-daughter , verbalized understanding .

## 2016-11-14 ENCOUNTER — Other Ambulatory Visit: Payer: Self-pay | Admitting: Internal Medicine

## 2016-11-14 DIAGNOSIS — Z1231 Encounter for screening mammogram for malignant neoplasm of breast: Secondary | ICD-10-CM

## 2016-11-28 NOTE — Progress Notes (Signed)
* [facility]     Assessment and Plan:  COPD. -Recent exacerbation with hospital admission, with pneumonia. -Patient completed her course of doxycycline, steroid taper. --Continue Anoro and ventolin prn.  --Will check PFT and follow up, will consider pulmonary rehab in future-- she is hindered by chronic back pain.   Restless leg syndrome --Symptoms are mostly due to peripheral neuropathy, appears controlled.   Date: 11/28/2016  MRN# [id number] [name] [birth year]   [name] is a 71 y.o. old female seen in follow up for chief complaint of  Chief Complaint  Patient presents with  . Hospitalization Follow-up    SOB w/exertion: NP cough at times:     HPI:   71 y.o. y/o female with a PMH of COPD, Bronchitis, Asthma, Allergies, Thyroid Disease, Pneumonia, Restless leg syndrome, Hypertension, Depression, Back pain, Heart Murmur, Shingles, and Scoliosis.  She presented to [facility] ER 01/6  secondary to AECOPD, discharged 11/08/16.  She lives at home with her husband and is now able to do her ADLs . She is using Anoro once in the am, and ventolin once or twice per day. She is on no other inhalers.  She stopped smoking when she got out of the hospital.   She has symptoms of RLS, she notes that she has left foot nruropathy  CAT score 2/13 is 15.    Medication:   Outpatient Encounter Prescriptions as of 12/12/2016  Medication Sig  . Albuterol (VENTOLIN IN) Inhale 2 puffs into the lungs as needed.  Marland Kitchen alendronate (FOSAMAX) 70 MG tablet Take 70 mg by mouth once a week. Take with a full glass of water on an empty stomach. On Sunday  . amLODipine (NORVASC) 5 MG tablet Take 5 mg by mouth daily.  . Cholecalciferol (VITAMIN D) 2000 UNITS tablet Take 2,000 Units by mouth daily.  . citalopram (CELEXA) 20 MG tablet Take 40 mg by mouth daily.   Marland Kitchen doxycycline (VIBRAMYCIN) 50 MG capsule Take 2 capsules (100 mg total) by mouth 2 (two) times daily.  Marland Kitchen gabapentin (NEURONTIN)  300 MG capsule Take 300 mg by mouth 4 (four) times daily.  Marland Kitchen levothyroxine (SYNTHROID, LEVOTHROID) 112 MCG tablet Take 112 mcg by mouth daily before breakfast.  . meloxicam (MOBIC) 7.5 MG tablet Take 7.5 mg by mouth daily. Reported on 05/17/2016  . metoprolol (LOPRESSOR) 100 MG tablet Take 100 mg by mouth 2 (two) times daily.  . Multiple Vitamin (MULTIVITAMIN) tablet Take 1 tablet by mouth daily. Reported on 04/05/2016  . omeprazole (PRILOSEC) 20 MG capsule Take 20 mg by mouth daily.  Marland Kitchen oxyCODONE (OXY IR/ROXICODONE) 5 MG immediate release tablet Take 5 mg by mouth every 6 (six) hours as needed for severe pain.  . potassium chloride SA (K-DUR,KLOR-CON) 20 MEQ tablet Take 20 mEq by mouth daily.  . predniSONE (DELTASONE) 20 MG tablet Take 2 tablets for 3 days, then take 1 tablet for 3 days  . senna-docusate (SENOKOT-S) 8.6-50 MG per tablet Take 1 tablet by mouth daily.  Marland Kitchen umeclidinium-vilanterol (ANORO ELLIPTA) 62.5-25 MCG/INH AEPB Inhale 1 puff into the lungs daily.   No facility-administered encounter medications on file as of 12/12/2016.      Allergies:  Adhesive [tape] and Other  Review of Systems: Gen:  Denies  fever, sweats. HEENT: Denies blurred vision. Cvc:  No dizziness, chest pain or heaviness Resp:   Denies cough or sputum porduction. Gi: Denies swallowing difficulty, stomach pain. constipation, bowel incontinence Gu:  Denies bladder incontinence, burning urine  Ext:   No Joint pain, stiffness. Skin: No skin rash, easy bruising. Endoc:  No polyuria, polydipsia. Psych: No depression, insomnia. Other:  All other systems were reviewed and found to be negative other than what is mentioned in the HPI.   Physical Examination:   VS: BP 128/88 (BP Location: Left Arm, Cuff Size: Normal)   Pulse 65   Wt 177 lb (80.3 kg)   SpO2 97%   BMI 38.30 kg/m   General Appearance: No distress  Neuro:without focal findings,  speech normal,  HEENT: PERRLA, EOM intact. Pulmonary: normal breath  sounds, No wheezing.   CardiovascularNormal S1,S2.  No m/r/g.   Abdomen: Benign, Soft, non-tender. Renal:  No costovertebral tenderness  GU:  Not performed at this time. Endoc: No evident thyromegaly, no signs of acromegaly. Skin:   warm, no rash. Extremities: normal, no cyanosis, clubbing.   LABORATORY PANEL:   CBC No results for input(s): WBC, HGB, HCT, PLT in the last 168 hours. ------------------------------------------------------------------------------------------------------------------  Chemistries  No results for input(s): NA, K, CL, CO2, GLUCOSE, BUN, CREATININE, CALCIUM, MG, AST, ALT, ALKPHOS, BILITOT in the last 168 hours.  Invalid input(s): GFRCGP ------------------------------------------------------------------------------------------------------------------  Cardiac Enzymes No results for input(s): TROPONINI in the last 168 hours. ------------------------------------------------------------  RADIOLOGY:   No results found for this or any previous visit. Results for orders placed during the hospital encounter of 11/04/16  DG Chest 2 View   Narrative CLINICAL DATA:  Short of breath, cough  EXAM: CHEST  2 VIEW  COMPARISON:  03/06/2015  FINDINGS: Normal cardiac silhouette. Large hiatal hernia. A chronic interstitial pattern not changed. Chronic compression fracture of the lower thoracic spine with kyphosis. Chronic changes of RIGHT shoulder. LEFT shoulder arthroplasty and cervical spine fusion  IMPRESSION: 1. No acute findings. 2. Mild interstitial lung disease. 3. Large hiatal hernia   Electronically Signed   By: Suzy Bouchard M.D.   On: 11/04/2016 20:02    ------------------------------------------------------------------------------------------------------------------  Thank  you for allowing [facility] to assist in the care of your patient. Our recommendations are noted above.  Please contact us if we can be of  further service.   Marda Stalker, MD.  [facility] Office Number: [phone]  Patricia Pesa, M.D.  Vilinda Boehringer, M.D.  Merton Border, M.D  11/28/2016

## 2016-12-12 ENCOUNTER — Encounter: Payer: Self-pay | Admitting: Internal Medicine

## 2016-12-12 ENCOUNTER — Inpatient Hospital Stay: Payer: Medicare HMO | Admitting: Pulmonary Disease

## 2016-12-12 ENCOUNTER — Ambulatory Visit (INDEPENDENT_AMBULATORY_CARE_PROVIDER_SITE_OTHER): Payer: Medicare HMO | Admitting: Internal Medicine

## 2016-12-12 VITALS — BP 128/88 | HR 65 | Wt 177.0 lb

## 2016-12-12 DIAGNOSIS — J441 Chronic obstructive pulmonary disease with (acute) exacerbation: Secondary | ICD-10-CM | POA: Diagnosis not present

## 2016-12-12 NOTE — Patient Instructions (Addendum)
--  Continue using Anoro inhaler.  --Complete lung function test before next visit.

## 2016-12-12 NOTE — Addendum Note (Signed)
Addended by: Oscar La R on: 12/12/2016 10:32 AM   Modules accepted: Orders

## 2016-12-14 ENCOUNTER — Ambulatory Visit
Admission: RE | Admit: 2016-12-14 | Discharge: 2016-12-14 | Disposition: A | Discharge status: Home / Self Care | Payer: Medicare HMO | Source: Ambulatory Visit | Attending: Internal Medicine | Admitting: Internal Medicine

## 2016-12-14 DIAGNOSIS — Z1231 Encounter for screening mammogram for malignant neoplasm of breast: Secondary | ICD-10-CM | POA: Insufficient documentation

## 2016-12-28 ENCOUNTER — Inpatient Hospital Stay: Payer: Medicare HMO | Admitting: Pulmonary Disease

## 2017-11-05 DIAGNOSIS — M5441 Lumbago with sciatica, right side: Secondary | ICD-10-CM | POA: Diagnosis not present

## 2017-11-05 DIAGNOSIS — M5136 Other intervertebral disc degeneration, lumbar region: Secondary | ICD-10-CM | POA: Diagnosis not present

## 2017-11-13 NOTE — Progress Notes (Signed)
* [facility]     Assessment and Plan:  COPD. -She quit smoking and is feeling better.  --Change anoro (too expensive) to spiriva once daily.  --Declined lung cancer screening.   Restless leg syndrome --Symptoms are mostly due to peripheral neuropathy, appears controlled.   Date: 11/13/2017  MRN# [id number] [name] 1946-03-14   [name] is a 72 y.o. old female seen in follow up for chief complaint of  Chief Complaint  Patient presents with  . COPD    wheezing: SOB w/activity: cough at times:      HPI:   72 y.o. y/o female with a PMH of COPD, Bronchitis, Asthma, Allergies, Thyroid Disease, Pneumonia, Restless leg syndrome, Hypertension, Depression, Back pain, Heart Murmur, Shingles, and Scoliosis.  She presented to [facility] ER 01/6  secondary to AECOPD, discharged 11/08/16.  She is off Anoro due to cost, she uses a rescue inhaler about or twice per week.  She has quit smoking about a year ago.  She has symptoms of RLS, she notes that she has left foot nruropathy  She got a flu shot.  Prevnar in 2018.     Medication:   Outpatient Encounter Medications as of 11/14/2017  Medication Sig  . Albuterol (VENTOLIN IN) Inhale 2 puffs into the lungs as needed.  Marland Kitchen alendronate (FOSAMAX) 70 MG tablet Take 70 mg by mouth once a week. Take with a full glass of water on an empty stomach. On Sunday  . amLODipine (NORVASC) 5 MG tablet Take 5 mg by mouth daily.  . Cholecalciferol (VITAMIN D) 2000 UNITS tablet Take 2,000 Units by mouth daily.  . citalopram (CELEXA) 20 MG tablet Take 40 mg by mouth daily.   Marland Kitchen gabapentin (NEURONTIN) 300 MG capsule Take 300 mg by mouth 4 (four) times daily.  Marland Kitchen levothyroxine (SYNTHROID, LEVOTHROID) 112 MCG tablet Take 112 mcg by mouth daily before breakfast.  . meloxicam (MOBIC) 7.5 MG tablet Take 7.5 mg by mouth daily. Reported on 05/17/2016  . metoprolol (LOPRESSOR) 100 MG tablet Take 100 mg by mouth 2 (two) times daily.  . Multiple  Vitamin (MULTIVITAMIN) tablet Take 1 tablet by mouth daily. Reported on 04/05/2016  . omeprazole (PRILOSEC) 20 MG capsule Take 20 mg by mouth daily.  . potassium chloride SA (K-DUR,KLOR-CON) 20 MEQ tablet Take 20 mEq by mouth daily.  Marland Kitchen senna-docusate (SENOKOT-S) 8.6-50 MG per tablet Take 1 tablet by mouth daily.  Marland Kitchen umeclidinium-vilanterol (ANORO ELLIPTA) 62.5-25 MCG/INH AEPB Inhale 1 puff into the lungs daily.   No facility-administered encounter medications on file as of 11/14/2017.      Allergies:  Adhesive [tape] and Other  Review of Systems: Gen:  Denies  fever, sweats. HEENT: Denies blurred vision. Cvc:  No dizziness, chest pain or heaviness Resp:   Denies cough or sputum porduction. Gi: Denies swallowing difficulty, stomach pain. constipation, bowel incontinence Gu:  Denies bladder incontinence, burning urine Ext:   No Joint pain, stiffness. Skin: No skin rash, easy bruising. Endoc:  No polyuria, polydipsia. Psych: No depression, insomnia. Other:  All other systems were reviewed and found to be negative other than what is mentioned in the HPI.   Physical Examination:   VS: BP 132/80 (BP Location: Left Arm, Cuff Size: Normal)   Pulse 76   Ht 4\' 9"  (1.448 m)   Wt 196 lb (88.9 kg)   SpO2 95%   BMI 42.41 kg/m   General Appearance: No distress  Neuro:without focal findings,  speech normal,  HEENT:  PERRLA, EOM intact. Pulmonary: normal breath sounds, No wheezing.   CardiovascularNormal S1,S2.  No m/r/g.   Abdomen: Benign, Soft, non-tender. Renal:  No costovertebral tenderness  GU:  Not performed at this time. Endoc: No evident thyromegaly, no signs of acromegaly. Skin:   warm, no rash. Extremities: normal, no cyanosis, clubbing.   LABORATORY PANEL:   CBC No results for input(s): WBC, HGB, HCT, PLT in the last 168 hours. ------------------------------------------------------------------------------------------------------------------  Chemistries  No results for  input(s): NA, K, CL, CO2, GLUCOSE, BUN, CREATININE, CALCIUM, MG, AST, ALT, ALKPHOS, BILITOT in the last 168 hours.  Invalid input(s): GFRCGP ------------------------------------------------------------------------------------------------------------------  Cardiac Enzymes No results for input(s): TROPONINI in the last 168 hours. ------------------------------------------------------------  RADIOLOGY:   No results found for this or any previous visit. Results for orders placed during the hospital encounter of 11/04/16  DG Chest 2 View   Narrative CLINICAL DATA:  Short of breath, cough  EXAM: CHEST  2 VIEW  COMPARISON:  03/06/2015  FINDINGS: Normal cardiac silhouette. Large hiatal hernia. A chronic interstitial pattern not changed. Chronic compression fracture of the lower thoracic spine with kyphosis. Chronic changes of RIGHT shoulder. LEFT shoulder arthroplasty and cervical spine fusion  IMPRESSION: 1. No acute findings. 2. Mild interstitial lung disease. 3. Large hiatal hernia   Electronically Signed   By: Suzy Bouchard M.D.   On: 11/04/2016 20:02    ------------------------------------------------------------------------------------------------------------------  Thank  you for allowing [facility] to assist in the care of your patient. Our recommendations are noted above.  Please contact us if we can be of further service.   Marda Stalker, MD.  [facility] Office Number: [phone]  Patricia Pesa, M.D.  Merton Border, M.D  11/13/2017

## 2017-11-14 ENCOUNTER — Ambulatory Visit: Payer: PPO | Admitting: Internal Medicine

## 2017-11-14 ENCOUNTER — Encounter: Payer: Self-pay | Admitting: Internal Medicine

## 2017-11-14 VITALS — BP 132/80 | HR 76 | Ht <= 58 in | Wt 196.0 lb

## 2017-11-14 DIAGNOSIS — J449 Chronic obstructive pulmonary disease, unspecified: Secondary | ICD-10-CM

## 2017-11-14 DIAGNOSIS — M48062 Spinal stenosis, lumbar region with neurogenic claudication: Secondary | ICD-10-CM

## 2017-11-14 MED ORDER — TIOTROPIUM BROMIDE MONOHYDRATE 18 MCG IN CAPS
18.0000 ug | ORAL_CAPSULE | Freq: Every day | RESPIRATORY_TRACT | 12 refills | Status: DC
Start: 2017-11-14 — End: 2021-10-29

## 2017-11-14 NOTE — Patient Instructions (Addendum)
Congratulations on quitting smoking!!  Will change anoro to Spiriva once per day.

## 2017-11-16 DIAGNOSIS — M545 Low back pain: Secondary | ICD-10-CM | POA: Diagnosis not present

## 2017-11-16 DIAGNOSIS — G8929 Other chronic pain: Secondary | ICD-10-CM | POA: Diagnosis not present

## 2017-11-20 DIAGNOSIS — G8929 Other chronic pain: Secondary | ICD-10-CM | POA: Diagnosis not present

## 2017-11-20 DIAGNOSIS — M545 Low back pain: Secondary | ICD-10-CM | POA: Diagnosis not present

## 2017-11-23 DIAGNOSIS — G8929 Other chronic pain: Secondary | ICD-10-CM | POA: Diagnosis not present

## 2017-11-23 DIAGNOSIS — M545 Low back pain: Secondary | ICD-10-CM | POA: Diagnosis not present

## 2017-11-26 DIAGNOSIS — M545 Low back pain: Secondary | ICD-10-CM | POA: Diagnosis not present

## 2017-11-26 DIAGNOSIS — G8929 Other chronic pain: Secondary | ICD-10-CM | POA: Diagnosis not present

## 2017-11-28 DIAGNOSIS — E034 Atrophy of thyroid (acquired): Secondary | ICD-10-CM | POA: Diagnosis not present

## 2017-11-28 DIAGNOSIS — I10 Essential (primary) hypertension: Secondary | ICD-10-CM | POA: Diagnosis not present

## 2017-11-30 DIAGNOSIS — E034 Atrophy of thyroid (acquired): Secondary | ICD-10-CM | POA: Diagnosis not present

## 2017-11-30 DIAGNOSIS — I10 Essential (primary) hypertension: Secondary | ICD-10-CM | POA: Diagnosis not present

## 2017-11-30 DIAGNOSIS — G8929 Other chronic pain: Secondary | ICD-10-CM | POA: Diagnosis not present

## 2017-11-30 DIAGNOSIS — M545 Low back pain: Secondary | ICD-10-CM | POA: Diagnosis not present

## 2017-12-05 ENCOUNTER — Other Ambulatory Visit: Payer: Self-pay | Admitting: Internal Medicine

## 2017-12-05 DIAGNOSIS — E034 Atrophy of thyroid (acquired): Secondary | ICD-10-CM | POA: Diagnosis not present

## 2017-12-05 DIAGNOSIS — J410 Simple chronic bronchitis: Secondary | ICD-10-CM | POA: Diagnosis not present

## 2017-12-05 DIAGNOSIS — Z0001 Encounter for general adult medical examination with abnormal findings: Secondary | ICD-10-CM | POA: Diagnosis not present

## 2017-12-05 DIAGNOSIS — Z13820 Encounter for screening for osteoporosis: Secondary | ICD-10-CM | POA: Diagnosis not present

## 2017-12-05 DIAGNOSIS — D649 Anemia, unspecified: Secondary | ICD-10-CM | POA: Diagnosis not present

## 2017-12-05 DIAGNOSIS — I10 Essential (primary) hypertension: Secondary | ICD-10-CM | POA: Diagnosis not present

## 2017-12-05 DIAGNOSIS — M810 Age-related osteoporosis without current pathological fracture: Secondary | ICD-10-CM | POA: Diagnosis not present

## 2017-12-05 DIAGNOSIS — R1031 Right lower quadrant pain: Secondary | ICD-10-CM

## 2017-12-05 DIAGNOSIS — Z5181 Encounter for therapeutic drug level monitoring: Secondary | ICD-10-CM | POA: Diagnosis not present

## 2017-12-11 DIAGNOSIS — G8929 Other chronic pain: Secondary | ICD-10-CM | POA: Diagnosis not present

## 2017-12-11 DIAGNOSIS — M545 Low back pain: Secondary | ICD-10-CM | POA: Diagnosis not present

## 2017-12-13 ENCOUNTER — Ambulatory Visit: Payer: PPO

## 2017-12-13 DIAGNOSIS — G8929 Other chronic pain: Secondary | ICD-10-CM | POA: Diagnosis not present

## 2017-12-13 DIAGNOSIS — M545 Low back pain: Secondary | ICD-10-CM | POA: Diagnosis not present

## 2017-12-14 DIAGNOSIS — Z1211 Encounter for screening for malignant neoplasm of colon: Secondary | ICD-10-CM | POA: Diagnosis not present

## 2017-12-20 DIAGNOSIS — M545 Low back pain: Secondary | ICD-10-CM | POA: Diagnosis not present

## 2017-12-20 DIAGNOSIS — G8929 Other chronic pain: Secondary | ICD-10-CM | POA: Diagnosis not present

## 2017-12-24 DIAGNOSIS — M8588 Other specified disorders of bone density and structure, other site: Secondary | ICD-10-CM | POA: Diagnosis not present

## 2017-12-24 DIAGNOSIS — Z13820 Encounter for screening for osteoporosis: Secondary | ICD-10-CM | POA: Diagnosis not present

## 2017-12-25 DIAGNOSIS — M545 Low back pain: Secondary | ICD-10-CM | POA: Diagnosis not present

## 2017-12-25 DIAGNOSIS — G8929 Other chronic pain: Secondary | ICD-10-CM | POA: Diagnosis not present

## 2017-12-26 ENCOUNTER — Ambulatory Visit
Admission: RE | Admit: 2017-12-26 | Discharge: 2017-12-26 | Disposition: A | Discharge status: Home / Self Care | Payer: PPO | Source: Ambulatory Visit | Attending: Internal Medicine | Admitting: Internal Medicine

## 2017-12-26 DIAGNOSIS — M4186 Other forms of scoliosis, lumbar region: Secondary | ICD-10-CM | POA: Diagnosis not present

## 2017-12-26 DIAGNOSIS — M4854XA Collapsed vertebra, not elsewhere classified, thoracic region, initial encounter for fracture: Secondary | ICD-10-CM | POA: Diagnosis not present

## 2017-12-26 DIAGNOSIS — Z9049 Acquired absence of other specified parts of digestive tract: Secondary | ICD-10-CM | POA: Insufficient documentation

## 2017-12-26 DIAGNOSIS — R1031 Right lower quadrant pain: Secondary | ICD-10-CM | POA: Insufficient documentation

## 2017-12-26 DIAGNOSIS — X58XXXA Exposure to other specified factors, initial encounter: Secondary | ICD-10-CM | POA: Diagnosis not present

## 2017-12-26 DIAGNOSIS — K429 Umbilical hernia without obstruction or gangrene: Secondary | ICD-10-CM | POA: Diagnosis not present

## 2017-12-26 DIAGNOSIS — K439 Ventral hernia without obstruction or gangrene: Secondary | ICD-10-CM | POA: Diagnosis not present

## 2017-12-26 DIAGNOSIS — Z9889 Other specified postprocedural states: Secondary | ICD-10-CM | POA: Diagnosis not present

## 2017-12-26 MED ORDER — IOPAMIDOL (ISOVUE-300) INJECTION 61%
100.0000 mL | Freq: Once | INTRAVENOUS | Status: AC | PRN
  Administered 2017-12-26: 100 mL via INTRAVENOUS

## 2018-01-03 DIAGNOSIS — R1310 Dysphagia, unspecified: Secondary | ICD-10-CM | POA: Diagnosis not present

## 2018-01-03 DIAGNOSIS — D509 Iron deficiency anemia, unspecified: Secondary | ICD-10-CM | POA: Diagnosis not present

## 2018-01-03 DIAGNOSIS — K219 Gastro-esophageal reflux disease without esophagitis: Secondary | ICD-10-CM | POA: Diagnosis not present

## 2018-01-15 ENCOUNTER — Encounter: Payer: Self-pay | Admitting: *Deleted

## 2018-01-16 ENCOUNTER — Ambulatory Visit: Payer: PPO | Admitting: Anesthesiology

## 2018-01-16 ENCOUNTER — Ambulatory Visit
Admission: RE | Admit: 2018-01-16 | Discharge: 2018-01-16 | Disposition: A | Discharge status: Home / Self Care | Payer: PPO | Source: Ambulatory Visit | Attending: Internal Medicine | Admitting: Internal Medicine

## 2018-01-16 ENCOUNTER — Encounter
Admission: RE | Disposition: A | Discharge status: Home / Self Care | Payer: Self-pay | Source: Ambulatory Visit | Attending: Internal Medicine

## 2018-01-16 ENCOUNTER — Encounter: Payer: Self-pay | Admitting: *Deleted

## 2018-01-16 DIAGNOSIS — D509 Iron deficiency anemia, unspecified: Secondary | ICD-10-CM | POA: Diagnosis not present

## 2018-01-16 DIAGNOSIS — K5730 Diverticulosis of large intestine without perforation or abscess without bleeding: Secondary | ICD-10-CM | POA: Diagnosis not present

## 2018-01-16 DIAGNOSIS — K228 Other specified diseases of esophagus: Secondary | ICD-10-CM | POA: Diagnosis not present

## 2018-01-16 DIAGNOSIS — F329 Major depressive disorder, single episode, unspecified: Secondary | ICD-10-CM | POA: Diagnosis not present

## 2018-01-16 DIAGNOSIS — K6389 Other specified diseases of intestine: Secondary | ICD-10-CM | POA: Diagnosis not present

## 2018-01-16 DIAGNOSIS — Z79899 Other long term (current) drug therapy: Secondary | ICD-10-CM | POA: Diagnosis not present

## 2018-01-16 DIAGNOSIS — J449 Chronic obstructive pulmonary disease, unspecified: Secondary | ICD-10-CM | POA: Insufficient documentation

## 2018-01-16 DIAGNOSIS — Z79890 Hormone replacement therapy (postmenopausal): Secondary | ICD-10-CM | POA: Diagnosis not present

## 2018-01-16 DIAGNOSIS — E039 Hypothyroidism, unspecified: Secondary | ICD-10-CM | POA: Insufficient documentation

## 2018-01-16 DIAGNOSIS — R1310 Dysphagia, unspecified: Secondary | ICD-10-CM | POA: Insufficient documentation

## 2018-01-16 DIAGNOSIS — K2950 Unspecified chronic gastritis without bleeding: Secondary | ICD-10-CM | POA: Diagnosis not present

## 2018-01-16 DIAGNOSIS — K222 Esophageal obstruction: Secondary | ICD-10-CM | POA: Diagnosis not present

## 2018-01-16 DIAGNOSIS — Z791 Long term (current) use of non-steroidal anti-inflammatories (NSAID): Secondary | ICD-10-CM | POA: Insufficient documentation

## 2018-01-16 DIAGNOSIS — K640 First degree hemorrhoids: Secondary | ICD-10-CM | POA: Diagnosis not present

## 2018-01-16 DIAGNOSIS — I10 Essential (primary) hypertension: Secondary | ICD-10-CM | POA: Diagnosis not present

## 2018-01-16 DIAGNOSIS — L814 Other melanin hyperpigmentation: Secondary | ICD-10-CM | POA: Insufficient documentation

## 2018-01-16 DIAGNOSIS — Z6841 Body Mass Index (BMI) 40.0 and over, adult: Secondary | ICD-10-CM | POA: Insufficient documentation

## 2018-01-16 DIAGNOSIS — Z7983 Long term (current) use of bisphosphonates: Secondary | ICD-10-CM | POA: Diagnosis not present

## 2018-01-16 DIAGNOSIS — K449 Diaphragmatic hernia without obstruction or gangrene: Secondary | ICD-10-CM | POA: Diagnosis not present

## 2018-01-16 DIAGNOSIS — K2960 Other gastritis without bleeding: Secondary | ICD-10-CM | POA: Diagnosis not present

## 2018-01-16 DIAGNOSIS — K648 Other hemorrhoids: Secondary | ICD-10-CM | POA: Diagnosis not present

## 2018-01-16 DIAGNOSIS — K5790 Diverticulosis of intestine, part unspecified, without perforation or abscess without bleeding: Secondary | ICD-10-CM | POA: Diagnosis not present

## 2018-01-16 HISTORY — PX: ESOPHAGOGASTRODUODENOSCOPY (EGD) WITH PROPOFOL: SHX5813

## 2018-01-16 HISTORY — PX: COLONOSCOPY WITH PROPOFOL: SHX5780

## 2018-01-16 HISTORY — DX: Headache, unspecified: R51.9

## 2018-01-16 HISTORY — DX: Headache: R51

## 2018-01-16 SURGERY — COLONOSCOPY WITH PROPOFOL
Anesthesia: General

## 2018-01-16 MED ORDER — PROPOFOL 500 MG/50ML IV EMUL
INTRAVENOUS | Status: DC | PRN
  Administered 2018-01-16: 120 ug/kg/min via INTRAVENOUS

## 2018-01-16 MED ORDER — LIDOCAINE HCL (PF) 1 % IJ SOLN
2.0000 mL | Freq: Once | INTRAMUSCULAR | Status: AC
  Administered 2018-01-16: 0.3 mL via INTRADERMAL

## 2018-01-16 MED ORDER — LIDOCAINE 2% (20 MG/ML) 5 ML SYRINGE
INTRAMUSCULAR | Status: DC | PRN
  Administered 2018-01-16: 25 mg via INTRAVENOUS

## 2018-01-16 MED ORDER — SODIUM CHLORIDE 0.9 % IV SOLN
INTRAVENOUS | Status: DC
  Administered 2018-01-16: 1000 mL via INTRAVENOUS

## 2018-01-16 MED ORDER — MIDAZOLAM HCL 5 MG/5ML IJ SOLN
INTRAMUSCULAR | Status: DC | PRN
  Administered 2018-01-16: 1 mg via INTRAVENOUS

## 2018-01-16 MED ORDER — MIDAZOLAM HCL 2 MG/2ML IJ SOLN
INTRAMUSCULAR | Status: AC
  Filled 2018-01-16: qty 2

## 2018-01-16 MED ORDER — GLYCOPYRROLATE 0.2 MG/ML IJ SOLN
INTRAMUSCULAR | Status: AC
  Filled 2018-01-16: qty 1

## 2018-01-16 MED ORDER — PROPOFOL 500 MG/50ML IV EMUL
INTRAVENOUS | Status: AC
  Filled 2018-01-16: qty 50

## 2018-01-16 MED ORDER — LIDOCAINE HCL (PF) 1 % IJ SOLN
INTRAMUSCULAR | Status: AC
  Administered 2018-01-16: 0.3 mL via INTRADERMAL
  Filled 2018-01-16: qty 2

## 2018-01-16 MED ORDER — GLYCOPYRROLATE 0.2 MG/ML IJ SOLN
INTRAMUSCULAR | Status: DC | PRN
  Administered 2018-01-16: 0.2 mg via INTRAVENOUS

## 2018-01-16 MED ORDER — PROPOFOL 10 MG/ML IV BOLUS
INTRAVENOUS | Status: AC
  Filled 2018-01-16: qty 20

## 2018-01-16 MED ORDER — PROPOFOL 10 MG/ML IV BOLUS
INTRAVENOUS | Status: DC | PRN
  Administered 2018-01-16 (×2): 50 mg via INTRAVENOUS

## 2018-01-16 MED ORDER — LIDOCAINE HCL (PF) 2 % IJ SOLN
INTRAMUSCULAR | Status: AC
  Filled 2018-01-16: qty 10

## 2018-01-16 NOTE — Op Note (Addendum)
[facility] Patient Name: [name] Procedure Date: 01/16/2018 12:46 PM MRN: [id number] Account #: [id number] Date of Birth: 08-24-1946 Admit Type: Outpatient Age: 72 Room: [facility] ENDO ROOM 3 Gender: Female Note Status: Supervisor Override Procedure:            Colonoscopy Indications:          Unexplained iron deficiency anemia Providers:            Benay Pike. Alice Reichert MD, MD Referring MD:         Baxter Hire, MD (Referring MD) Medicines:            Propofol per Anesthesia Complications:        No immediate complications. Procedure:            Pre-Anesthesia Assessment:                       - The risks and benefits of the procedure and the                        sedation options and risks were discussed with the                        patient. All questions were answered and informed                        consent was obtained.                       - Patient identification and proposed procedure were                        verified prior to the procedure by the nurse. The                        procedure was verified in the procedure room.                       - ASA Grade Assessment: III - A patient with severe                        systemic disease.                       - After reviewing the risks and benefits, the patient                        was deemed in satisfactory condition to undergo the                        procedure.                       After obtaining informed consent, the colonoscope was                        passed under direct vision. Throughout the procedure,                        the patient's blood pressure, pulse, and oxygen  saturations were monitored continuously. The                        Colonoscope was introduced through the anus and                        advanced to the the cecum, identified by appendiceal                        orifice and ileocecal valve. The colonoscopy was                      performed without difficulty. The patient tolerated the                        procedure well. The quality of the bowel preparation                        was good. The ileocecal valve, appendiceal orifice, and                        rectum were photographed. Findings:      The perianal and digital rectal examinations were normal. Pertinent       negatives include normal sphincter tone and no palpable rectal lesions.      A few small-mouthed diverticula were found in the sigmoid colon.      A diffuse area of mild melanosis was found in the entire colon.      Non-bleeding internal hemorrhoids were found during retroflexion. The       hemorrhoids were Grade I (internal hemorrhoids that do not prolapse).      The exam was otherwise without abnormality. Impression:           - Diverticulosis in the sigmoid colon.                       - Melanosis in the colon.                       - Non-bleeding internal hemorrhoids.                       - The examination was otherwise normal.                       - No specimens collected. Recommendation:       - Patient has a contact number available for                        emergencies. The signs and symptoms of potential                        delayed complications were discussed with the patient.                        Return to normal activities tomorrow. Written discharge                        instructions were provided to the patient.                       - Resume previous diet.                       -  Continue present medications.                       - Await pathology results from EGD, also performed                        today.                       - May need to consider surgical referral for repair of                        paraesophageal hernia if little to no response to                        balloon dilation of esophagus.                       - Return to physician assistant in 2 months.                       -  The findings and recommendations were discussed with                        the patient and their family. Procedure Code(s):    --- Professional ---                       [phone], Colonoscopy, flexible; diagnostic, including                        collection of specimen(s) by brushing or washing, when                        performed (separate procedure) Diagnosis Code(s):    --- Professional ---                       K57.30, Diverticulosis of large intestine without                        perforation or abscess without bleeding                       D50.9, Iron deficiency anemia, unspecified                       K63.89, Other specified diseases of intestine                       K64.0, First degree hemorrhoids CPT copyright 2016 [employer]. All rights reserved. The codes documented in this report are preliminary and upon coder review may  be revised to meet current compliance requirements. Efrain Sella MD, MD 01/16/2018 2:07:39 PM This report has been signed electronically. Number of Addenda: 0 Note Initiated On: 01/16/2018 12:46 PM Scope Withdrawal Time: 0 hours 6 minutes 8 seconds  Total Procedure Duration: 0 hours 10 minutes 41 seconds       [facility]

## 2018-01-16 NOTE — Op Note (Addendum)
[facility] Patient Name: [name] Procedure Date: 01/16/2018 12:56 PM MRN: [id number] Account #: [id number] Date of Birth: 1946-09-07 Admit Type: Outpatient Age: 72 Room: [facility] ENDO ROOM 3 Gender: Female Note Status: Supervisor Override Procedure:            Upper GI endoscopy Indications:          Dysphagia, Heartburn Providers:            Benay Pike. Alice Reichert MD, MD Referring MD:         Baxter Hire, MD (Referring MD) Medicines:            Propofol per Anesthesia Complications:        No immediate complications. Procedure:            Pre-Anesthesia Assessment:                       - The risks and benefits of the procedure and the                        sedation options and risks were discussed with the                        patient. All questions were answered and informed                        consent was obtained.                       - Patient identification and proposed procedure were                        verified prior to the procedure by the nurse. The                        procedure was verified in the procedure room.                       - ASA Grade Assessment: III - A patient with severe                        systemic disease.                       - After reviewing the risks and benefits, the patient                        was deemed in satisfactory condition to undergo the                        procedure.                       After obtaining informed consent, the endoscope was                        passed under direct vision. Throughout the procedure,                        the patient's blood pressure, pulse, and oxygen  saturations were monitored continuously. The Endoscope                        was introduced through the mouth, and advanced to the                        third part of duodenum. The upper GI endoscopy was                        accomplished without difficulty. The patient  tolerated                        the procedure well. Findings:      Non-occlusive lower esophageal "B" ring, A TTS dilator was passed       through the scope. Dilation with an 18-19-20 mm balloon dilator was       performed to 20 mm. The dilation site was examined and showed no change.       Estimated blood loss: none.      Moderate tortuosity of the mid to distal esophagus was noted compatible       with a diagnosis of Presbyesophagus.      A large paraesophageal hernia was found.      No gross lesions were noted in the stomach.      The examined duodenum was normal.      The exam was otherwise without abnormality.      Localized mildly erythematous mucosa without bleeding was found on the       lesser curvature of the gastric antrum. Biopsies were taken with a cold       forceps for Helicobacter pylori testing. Impression:           - Large paraesophageal hernia.                       - No gross lesions in the stomach.                       - Normal examined duodenum.                       - The examination was otherwise normal.                       - No specimens collected. Recommendation:       - Await pathology results.                       - Proceed with colonoscopy Procedure Code(s):    --- Professional ---                       [phone], Esophagogastroduodenoscopy, flexible, transoral;                        with transendoscopic balloon dilation of esophagus                        (less than 30 mm diameter)                       [id number], Esophagogastroduodenoscopy, flexible, transoral;  with biopsy, single or multiple Diagnosis Code(s):    --- Professional ---                       K44.9, Diaphragmatic hernia without obstruction or                        gangrene                       R13.10, Dysphagia, unspecified                       R12, Heartburn CPT copyright 2016 American Medical Association. All rights reserved. The codes documented in this report  are preliminary and upon coder review may  be revised to meet current compliance requirements. Efrain Sella MD, MD 01/16/2018 1:51:25 PM This report has been signed electronically. Number of Addenda: 0 Note Initiated On: 01/16/2018 12:56 PM Total Procedure Duration: 0 hours 10 minutes 35 seconds       [facility]

## 2018-01-16 NOTE — H&P (Signed)
Outpatient short stay form Pre-procedure 01/16/2018 1:28 PM [name], M.D.  Primary Physician: [name], M.D.  Reason for visit:  Dysphagia, iron deficiency anemia.   History of present illness:  Patient's pleasant 72 year old female who is nave to colonoscopy present for iron deficiency anemia. She also has intermittent dysphagia to solid food. She remarks previous history of endoscopy but denies any previous need for esophageal dilatation.she takes omeprazole for previously uncomplicated GERD. She has mild left-sided abdominal cramping on occasion without rectal bleeding or change in bowel habits otherwise.    Current Facility-Administered Medications:  .  0.9 %  sodium chloride infusion, , Intravenous, Continuous, [place], Benay Pike, MD, Last Rate: 20 mL/hr at 01/16/18 1242, 1,000 mL at 01/16/18 1242  Facility-Administered Medications Ordered in Other Encounters:  .  glycopyrrolate (ROBINUL) injection, , Intravenous, Anesthesia Intra-op, Breanna Plate, CRNA, 0.2 mg at 01/16/18 1326 .  midazolam (VERSED) 5 MG/5ML injection, , Intravenous, Anesthesia Intra-op, Breanna Plate, CRNA, 1 mg at 01/16/18 1326  Medications Prior to Admission  Medication Sig Dispense Refill Last Dose  . Albuterol (VENTOLIN IN) Inhale 2 puffs into the lungs as needed.   Taking  . alendronate (FOSAMAX) 70 MG tablet Take 70 mg by mouth once a week. Take with a full glass of water on an empty stomach. On Sunday   Taking  . amLODipine (NORVASC) 5 MG tablet Take 5 mg by mouth daily.   01/16/2018 at 0700  . Cholecalciferol (VITAMIN D) 2000 UNITS tablet Take 2,000 Units by mouth daily.   Taking  . citalopram (CELEXA) 20 MG tablet Take 40 mg by mouth daily.    Taking  . gabapentin (NEURONTIN) 300 MG capsule Take 300 mg by mouth 4 (four) times daily.   Taking  . levothyroxine (SYNTHROID, LEVOTHROID) 112 MCG tablet Take 112 mcg by mouth daily before breakfast.   01/16/2018 at 0700  . meloxicam (MOBIC) 7.5 MG  tablet Take 7.5 mg by mouth daily. Reported on 05/17/2016   Taking  . metoprolol (LOPRESSOR) 100 MG tablet Take 100 mg by mouth 2 (two) times daily.   01/16/2018 at 0700  . Multiple Vitamin (MULTIVITAMIN) tablet Take 1 tablet by mouth daily. Reported on 04/05/2016   Taking  . omeprazole (PRILOSEC) 20 MG capsule Take 20 mg by mouth daily.   Taking  . potassium chloride SA (K-DUR,KLOR-CON) 20 MEQ tablet Take 20 mEq by mouth daily.   Taking  . senna-docusate (SENOKOT-S) 8.6-50 MG per tablet Take 1 tablet by mouth daily.   Taking  . tiotropium (SPIRIVA HANDIHALER) 18 MCG inhalation capsule Place 1 capsule (18 mcg total) into inhaler and inhale daily. 30 capsule 12 01/16/2018 at 0700  . umeclidinium-vilanterol (ANORO ELLIPTA) 62.5-25 MCG/INH AEPB Inhale 1 puff into the lungs daily. (Patient not taking: Reported on 11/14/2017) 1 each 1 Not Taking     Allergies  Allergen Reactions  . Adhesive [Tape] Other (See Comments)  . Other Rash    Plastic tape     Past Medical History:  Diagnosis Date  . Allergy   . Asthma   . Back pain   . Bronchitis   . COPD (chronic obstructive pulmonary disease) ([place])   . Degenerative disc disease, lumbar   . Depression   . FHx: cholecystectomy   . H/O: hysterectomy   . Headache   . Hypertension   . Murmur   . Pneumonia 03/31/16   being treated by PCP  . Restless legs syndrome   . Scoliosis   . Shingles   .  Thyroid disease     Review of systems:   Negative except that in HPI.   Physical Exam  Gen: Alert, oriented. Appears stated age.  HEENT: [place]/AT. PERRLA. Lungs: CTA, no wheezes. CV: RR nl S1, S2. Abd: soft, benign, no masses. BS+ Ext: No edema. Pulses 2+    Planned procedures: EGD and colonoscopy,. The patient understands the nature of the planned procedure, indications, risks, alternatives and potential complications including but not limited to bleeding, infection, perforation, damage to internal organs and possible oversedation/side effects  from anesthesia. The patient agrees and gives consent to proceed.  Please refer to procedure notes for findings, recommendations and patient disposition/instructions.    [name], M.D. Gastroenterology 01/16/2018  1:28 PM

## 2018-01-16 NOTE — Anesthesia Post-op Follow-up Note (Signed)
Anesthesia QCDR form completed.        

## 2018-01-16 NOTE — Interval H&P Note (Signed)
History and Physical Interval Note:  01/16/2018 1:30 PM  [name]  has presented today for surgery, with the diagnosis of IDA  The various methods of treatment have been discussed with the patient and family. After consideration of risks, benefits and other options for treatment, the patient has consented to  Procedure(s): COLONOSCOPY WITH PROPOFOL (N/A) ESOPHAGOGASTRODUODENOSCOPY (EGD) WITH PROPOFOL (N/A) as a surgical intervention .  The patient's history has been reviewed, patient examined, no change in status, stable for surgery.  I have reviewed the patient's chart and labs.  Questions were answered to the patient's satisfaction.     [place], [place]

## 2018-01-16 NOTE — Transfer of Care (Signed)
Immediate Anesthesia Transfer of Care Note  Patient: [name]  Procedure(s) Performed: COLONOSCOPY WITH PROPOFOL (N/A ) ESOPHAGOGASTRODUODENOSCOPY (EGD) WITH PROPOFOL (N/A )  Patient Location: Endoscopy Unit  Anesthesia Type:General  Level of Consciousness: awake  Airway & Oxygen Therapy: Patient Spontanous Breathing and Patient connected to nasal cannula oxygen  Post-op Assessment: Report given to RN and Post -op Vital signs reviewed and stable  Post vital signs: Reviewed  Last Vitals:  Vitals:   01/16/18 1220 01/16/18 1408  BP: (!) 123/58 97/65  Pulse: 71 81  Resp: 17 18  Temp: (!) 35.6 C (!) 36 C  SpO2: 99% 98%    Last Pain:  Vitals:   01/16/18 1220  TempSrc: Tympanic         Complications: No apparent anesthesia complications

## 2018-01-16 NOTE — Anesthesia Preprocedure Evaluation (Signed)
Anesthesia Evaluation  Patient identified by MRN, date of birth, ID band Patient awake    Reviewed: Allergy & Precautions, H&P , NPO status , Patient's Chart, lab work & pertinent test results, reviewed documented beta blocker date and time   History of Anesthesia Complications Negative for: history of anesthetic complications  Airway Mallampati: III  TM Distance: >3 FB Neck ROM: full    Dental  (+) Edentulous Upper, Dental Advidsory Given, Upper Dentures   Pulmonary shortness of breath and with exertion, asthma , neg sleep apnea, COPD,  COPD inhaler, neg recent URI, former smoker,           Cardiovascular Exercise Tolerance: Good hypertension, (-) angina(-) CAD, (-) Past MI, (-) Cardiac Stents and (-) CABG (-) dysrhythmias + Valvular Problems/Murmurs      Neuro/Psych PSYCHIATRIC DISORDERS Depression negative neurological ROS     GI/Hepatic Neg liver ROS, GERD  ,  Endo/Other  neg diabetesHypothyroidism Morbid obesity  Renal/GU negative Renal ROS  negative genitourinary   Musculoskeletal   Abdominal   Peds  Hematology negative hematology ROS (+)   Anesthesia Other Findings Past Medical History: No date: Allergy No date: Asthma No date: Back pain No date: Bronchitis No date: COPD (chronic obstructive pulmonary disease) ([facility]) No date: Degenerative disc disease, lumbar No date: Depression No date: FHx: cholecystectomy No date: H/O: hysterectomy No date: Headache No date: Hypertension No date: Murmur 03/31/16: Pneumonia     Comment:  being treated by PCP No date: Restless legs syndrome No date: Scoliosis No date: Shingles No date: Thyroid disease   Reproductive/Obstetrics negative OB ROS                             Anesthesia Physical Anesthesia Plan  ASA: III  Anesthesia Plan: General   Post-op Pain Management:    Induction: Intravenous  PONV Risk Score and Plan: 3 and  Propofol infusion  Airway Management Planned: Natural Airway and Nasal Cannula  Additional Equipment:   Intra-op Plan:   Post-operative Plan:   Informed Consent: I have reviewed the patients History and Physical, chart, labs and discussed the procedure including the risks, benefits and alternatives for the proposed anesthesia with the patient or authorized representative who has indicated his/her understanding and acceptance.   Dental Advisory Given  Plan Discussed with: Anesthesiologist, CRNA and Surgeon  Anesthesia Plan Comments:         Anesthesia Quick Evaluation

## 2018-01-17 NOTE — Anesthesia Postprocedure Evaluation (Signed)
Anesthesia Post Note  Patient: [name]  Procedure(s) Performed: COLONOSCOPY WITH PROPOFOL (N/A ) ESOPHAGOGASTRODUODENOSCOPY (EGD) WITH PROPOFOL (N/A )  Patient location during evaluation: Endoscopy Anesthesia Type: General Level of consciousness: awake and alert Pain management: pain level controlled Vital Signs Assessment: post-procedure vital signs reviewed and stable Respiratory status: spontaneous breathing, nonlabored ventilation, respiratory function stable and patient connected to nasal cannula oxygen Cardiovascular status: blood pressure returned to baseline and stable Postop Assessment: no apparent nausea or vomiting Anesthetic complications: no     Last Vitals:  Vitals Value Taken Time  BP    Temp    Pulse    Resp    SpO2      Last Pain:  Vitals:   01/17/18 0736  TempSrc:   PainSc: 0-No pain                 Martha Clan

## 2018-01-18 ENCOUNTER — Encounter: Payer: Self-pay | Admitting: Internal Medicine

## 2018-01-18 LAB — SURGICAL PATHOLOGY

## 2018-01-21 DIAGNOSIS — D509 Iron deficiency anemia, unspecified: Secondary | ICD-10-CM | POA: Diagnosis not present

## 2018-02-01 ENCOUNTER — Inpatient Hospital Stay: Payer: PPO

## 2018-02-01 ENCOUNTER — Encounter: Payer: Self-pay | Admitting: Oncology

## 2018-02-01 ENCOUNTER — Inpatient Hospital Stay: Payer: PPO | Attending: Oncology | Admitting: Oncology

## 2018-02-01 VITALS — BP 112/75 | HR 73 | Temp 98.2°F | Resp 18 | Ht <= 58 in | Wt 193.5 lb

## 2018-02-01 VITALS — BP 121/86 | HR 69 | Temp 97.2°F | Resp 18

## 2018-02-01 DIAGNOSIS — D509 Iron deficiency anemia, unspecified: Secondary | ICD-10-CM

## 2018-02-01 DIAGNOSIS — M48062 Spinal stenosis, lumbar region with neurogenic claudication: Secondary | ICD-10-CM

## 2018-02-01 DIAGNOSIS — M5030 Other cervical disc degeneration, unspecified cervical region: Secondary | ICD-10-CM | POA: Diagnosis not present

## 2018-02-01 DIAGNOSIS — Z87891 Personal history of nicotine dependence: Secondary | ICD-10-CM | POA: Diagnosis not present

## 2018-02-01 DIAGNOSIS — M1710 Unilateral primary osteoarthritis, unspecified knee: Secondary | ICD-10-CM | POA: Diagnosis not present

## 2018-02-01 DIAGNOSIS — I10 Essential (primary) hypertension: Secondary | ICD-10-CM | POA: Diagnosis not present

## 2018-02-01 DIAGNOSIS — R5383 Other fatigue: Secondary | ICD-10-CM

## 2018-02-01 DIAGNOSIS — Z79899 Other long term (current) drug therapy: Secondary | ICD-10-CM | POA: Diagnosis not present

## 2018-02-01 DIAGNOSIS — M5136 Other intervertebral disc degeneration, lumbar region: Secondary | ICD-10-CM | POA: Insufficient documentation

## 2018-02-01 DIAGNOSIS — K5900 Constipation, unspecified: Secondary | ICD-10-CM | POA: Diagnosis not present

## 2018-02-01 DIAGNOSIS — K219 Gastro-esophageal reflux disease without esophagitis: Secondary | ICD-10-CM | POA: Diagnosis not present

## 2018-02-01 DIAGNOSIS — E039 Hypothyroidism, unspecified: Secondary | ICD-10-CM | POA: Diagnosis not present

## 2018-02-01 DIAGNOSIS — M2550 Pain in unspecified joint: Secondary | ICD-10-CM | POA: Diagnosis not present

## 2018-02-01 DIAGNOSIS — M810 Age-related osteoporosis without current pathological fracture: Secondary | ICD-10-CM | POA: Diagnosis not present

## 2018-02-01 DIAGNOSIS — J449 Chronic obstructive pulmonary disease, unspecified: Secondary | ICD-10-CM | POA: Diagnosis not present

## 2018-02-01 LAB — CBC WITH DIFFERENTIAL/PLATELET
Basophils Absolute: 0.1 10*3/uL (ref 0–0.1)
Basophils Relative: 2 %
Eosinophils Absolute: 0.2 10*3/uL (ref 0–0.7)
Eosinophils Relative: 2 %
HCT: 32.3 % — ABNORMAL LOW (ref 35.0–47.0)
Hemoglobin: 10.2 g/dL — ABNORMAL LOW (ref 12.0–16.0)
Lymphocytes Relative: 19 %
Lymphs Abs: 1.5 10*3/uL (ref 1.0–3.6)
MCH: 21.8 pg — ABNORMAL LOW (ref 26.0–34.0)
MCHC: 31.4 g/dL — ABNORMAL LOW (ref 32.0–36.0)
MCV: 69.5 fL — ABNORMAL LOW (ref 80.0–100.0)
Monocytes Absolute: 0.8 10*3/uL (ref 0.2–0.9)
Monocytes Relative: 10 %
Neutro Abs: 5.5 10*3/uL (ref 1.4–6.5)
Neutrophils Relative %: 67 %
Platelets: 299 10*3/uL (ref 150–440)
RBC: 4.65 MIL/uL (ref 3.80–5.20)
RDW: 22.1 % — ABNORMAL HIGH (ref 11.5–14.5)
WBC: 8 10*3/uL (ref 3.6–11.0)

## 2018-02-01 LAB — URINALYSIS, COMPLETE (UACMP) WITH MICROSCOPIC
Bacteria, UA: NONE SEEN
Bilirubin Urine: NEGATIVE
Glucose, UA: NEGATIVE mg/dL
Hgb urine dipstick: NEGATIVE
Ketones, ur: NEGATIVE mg/dL
Leukocytes, UA: NEGATIVE
Nitrite: NEGATIVE
Protein, ur: NEGATIVE mg/dL
RBC / HPF: NONE SEEN RBC/hpf (ref 0–5)
Specific Gravity, Urine: 1.009 (ref 1.005–1.030)
Squamous Epithelial / LPF: NONE SEEN
pH: 5 (ref 5.0–8.0)

## 2018-02-01 LAB — VITAMIN B12: Vitamin B-12: 199 pg/mL (ref 180–914)

## 2018-02-01 LAB — IRON AND TIBC
Iron: 34 ug/dL (ref 28–170)
Saturation Ratios: 7 % — ABNORMAL LOW (ref 10.4–31.8)
TIBC: 515 ug/dL — ABNORMAL HIGH (ref 250–450)
UIBC: 481 ug/dL

## 2018-02-01 LAB — FERRITIN: Ferritin: 10 ng/mL — ABNORMAL LOW (ref 11–307)

## 2018-02-01 LAB — FOLATE: Folate: 8.9 ng/mL (ref >5.9)

## 2018-02-01 MED ORDER — SODIUM CHLORIDE 0.9 % IV SOLN
Freq: Once | INTRAVENOUS | Status: AC
  Administered 2018-02-01: 15:00:00 via INTRAVENOUS
  Filled 2018-02-01: qty 1000

## 2018-02-01 MED ORDER — FERUMOXYTOL INJECTION 510 MG/17 ML
510.0000 mg | Freq: Once | INTRAVENOUS | Status: AC
  Administered 2018-02-01: 510 mg via INTRAVENOUS
  Filled 2018-02-01: qty 17

## 2018-02-01 NOTE — Progress Notes (Signed)
1559: Pt tolerated infusion well. Pt monitored 30 minutes post infusion. Pt and VS stable at time of discharge.

## 2018-02-04 ENCOUNTER — Encounter: Payer: Self-pay | Admitting: Oncology

## 2018-02-04 NOTE — Progress Notes (Deleted)
Hematology/Oncology Consult note [facility]  [phone] [phone]  Patient Care Team: Baxter Hire, MD as PCP - General (Internal Medicine)   Name of the patient: [name]  [id number]  71-11-1945   Date of visit: 02/04/18  Diagnosis- ***  Chief complaint/ Reason for visit- ***  Heme/Onc history: ***  Interval history- ***  ECOG PS- *** Pain scale- *** Opioid associated constipation- ***  Review of systems- ROS   Current treatment- ***  Allergies  Allergen Reactions  . Adhesive [Tape] Other (See Comments)  . Other Rash    Plastic tape     Past Medical History:  Diagnosis Date  . Allergy   . Asthma   . Back pain   . Bronchitis   . COPD (chronic obstructive pulmonary disease) ([place])   . Degenerative disc disease, lumbar   . Depression   . FHx: cholecystectomy   . GERD (gastroesophageal reflux disease)   . H/O: hysterectomy   . Headache   . Hypertension   . Murmur   . Osteoporosis   . Pneumonia 03/31/16   being treated by PCP  . Restless legs syndrome   . Scoliosis   . Shingles   . Thyroid disease      Past Surgical History:  Procedure Laterality Date  . ABDOMINAL HYSTERECTOMY    . APPENDECTOMY    . BACK SURGERY    . CHOLECYSTECTOMY    . COLONOSCOPY WITH PROPOFOL N/A 01/16/2018   Procedure: COLONOSCOPY WITH PROPOFOL;  Surgeon: Toledo, Benay Pike, MD;  Location: [facility] ENDOSCOPY;  Service: Gastroenterology;  Laterality: N/A;  . ESOPHAGOGASTRODUODENOSCOPY (EGD) WITH PROPOFOL N/A 01/16/2018   Procedure: ESOPHAGOGASTRODUODENOSCOPY (EGD) WITH PROPOFOL;  Surgeon: Toledo, Benay Pike, MD;  Location: [facility] ENDOSCOPY;  Service: Gastroenterology;  Laterality: N/A;  . JOINT REPLACEMENT Left 1998   shoulder  . NECK SURGERY Bilateral   . ROTATOR CUFF REPAIR Right   . SHOULDER OPEN ROTATOR CUFF REPAIR Right   . SHOULDER SURGERY Left    replacement    Social History   Socioeconomic History  . Marital  status: Married    Spouse name: Not on file  . Number of children: Not on file  . Years of education: Not on file  . Highest education level: Not on file  Occupational History  . Not on file  Social Needs  . Financial resource strain: Not on file  . Food insecurity:    Worry: Not on file    Inability: Not on file  . Transportation needs:    Medical: Not on file    Non-medical: Not on file  Tobacco Use  . Smoking status: Former Smoker    Packs/day: 1.00    Years: 30.00    Pack years: 30.00    Types: Cigarettes    Last attempt to quit: 11/04/2016    Years since quitting: 1.2  . Smokeless tobacco: Never Used  Substance and Sexual Activity  . Alcohol use: No    Alcohol/week: 0.0 oz  . Drug use: No  . Sexual activity: Not on file  Lifestyle  . Physical activity:    Days per week: Not on file    Minutes per session: Not on file  . Stress: Not on file  Relationships  . Social connections:    Talks on phone: Not on file    Gets together: Not on file    Attends religious service: Not on file    Active member of club or  organization: Not on file    Attends meetings of clubs or organizations: Not on file    Relationship status: Not on file  . Intimate partner violence:    Fear of current or ex partner: Not on file    Emotionally abused: Not on file    Physically abused: Not on file    Forced sexual activity: Not on file  Other Topics Concern  . Not on file  Social History Narrative  . Not on file    Family History  Problem Relation Age of Onset  . Kidney disease Father   . Heart disease Mother   . Diabetes Mother      Current Outpatient Medications:  .  Albuterol (VENTOLIN IN), Inhale 2 puffs into the lungs as needed., Disp: , Rfl:  .  alendronate (FOSAMAX) 70 MG tablet, Take 70 mg by mouth once a week. Take with a full glass of water on an empty stomach. On Sunday, Disp: , Rfl:  .  amLODipine (NORVASC) 5 MG tablet, Take 5 mg by mouth daily., Disp: , Rfl:  .   Cholecalciferol (VITAMIN D) 2000 UNITS tablet, Take 2,000 Units by mouth daily., Disp: , Rfl:  .  citalopram (CELEXA) 20 MG tablet, Take 40 mg by mouth daily. , Disp: , Rfl:  .  gabapentin (NEURONTIN) 300 MG capsule, Take 300 mg by mouth 4 (four) times daily., Disp: , Rfl:  .  levothyroxine (SYNTHROID, LEVOTHROID) 112 MCG tablet, Take 112 mcg by mouth daily before breakfast., Disp: , Rfl:  .  metoprolol (LOPRESSOR) 100 MG tablet, Take 100 mg by mouth 2 (two) times daily., Disp: , Rfl:  .  Multiple Vitamin (MULTIVITAMIN) tablet, Take 1 tablet by mouth daily. Reported on 04/05/2016, Disp: , Rfl:  .  omeprazole (PRILOSEC) 20 MG capsule, Take 20 mg by mouth 2 (two) times daily before a meal. , Disp: , Rfl:  .  potassium chloride SA (K-DUR,KLOR-CON) 20 MEQ tablet, Take 20 mEq by mouth daily., Disp: , Rfl:  .  senna-docusate (SENOKOT-S) 8.6-50 MG per tablet, Take 1 tablet by mouth daily., Disp: , Rfl:  .  tiotropium (SPIRIVA HANDIHALER) 18 MCG inhalation capsule, Place 1 capsule (18 mcg total) into inhaler and inhale daily., Disp: 30 capsule, Rfl: 12 .  meloxicam (MOBIC) 7.5 MG tablet, Take 7.5 mg by mouth daily. Reported on 05/17/2016, Disp: , Rfl:  .  umeclidinium-vilanterol (ANORO ELLIPTA) 62.5-25 MCG/INH AEPB, Inhale 1 puff into the lungs daily. (Patient not taking: Reported on 11/14/2017), Disp: 1 each, Rfl: 1  Physical exam:  Vitals:   02/01/18 1332 02/01/18 1333  BP: 112/75   Pulse: 73   Resp: 18   Temp: 98.2 F (36.8 C)   TempSrc: Tympanic   SpO2:  92%  Weight: 193 lb 8 oz (87.8 kg)   Height: 4\' 6"  (1.372 m)    Physical Exam   CMP Latest Ref Rng & Units 11/06/2016  Glucose 65 - 99 mg/dL 119(H)  BUN 6 - 20 mg/dL 11  Creatinine 0.44 - 1.00 mg/dL 0.44  Sodium 135 - 145 mmol/L 131(L)  Potassium 3.5 - 5.1 mmol/L 4.4  Chloride 101 - 111 mmol/L 97(L)  CO2 22 - 32 mmol/L 26  Calcium 8.9 - 10.3 mg/dL 8.6(L)  Total Protein 6.5 - 8.1 g/dL -  Total Bilirubin 0.3 - 1.2 mg/dL -  Alkaline Phos 38  - 126 U/L -  AST 15 - 41 U/L -  ALT 14 - 54 U/L -   CBC Latest  Ref Rng & Units 02/01/2018  WBC 3.6 - 11.0 K/uL 8.0  Hemoglobin 12.0 - 16.0 g/dL 10.2(L)  Hematocrit 35.0 - 47.0 % 32.3(L)  Platelets 150 - 440 K/uL 299    No images are attached to the encounter.  No results found.   Assessment and plan- Patient is a 72 y.o. female ***   Visit Diagnosis 1. Iron deficiency anemia, unspecified iron deficiency anemia type      Dr. Randa Evens, MD, MPH [facility] [phone] 02/04/2018 8:35 AM

## 2018-02-04 NOTE — Progress Notes (Signed)
Hematology/Oncology Consult note [facility] Telephone:([phone] [phone]  Patient Care Team: Baxter Hire, MD as PCP - General (Internal Medicine)   Name of the patient: [name]  [id number]  03/31/1946    Reason for referral- iron deficiency anemia   Referring physician- Dr. Ellin Mayhew  Date of visit: 02/04/18   History of presenting illness-patient is a 72 year old female with a past medical history significant for hypertension, hypothyroidism, COPD, osteoporosis among other medical problems she has been referred to [name] for evaluation and management of iron deficiency anemia.  She is currently postmenopausal and does not report any vaginal bleeding.  Denies any stool or urine.  Denies any dark melanotic stools but does notice dark stools when she takes.  Denies any consistent use of NSAIDs.  She was taking meloxicam recently which was discontinued by GI.  She was seen by [facility] for her iron deficiency anemia and underwent EGD colonoscopy in March 2019 as well as capsule endoscopy which did not reveal any evidence of bleeding.  Recent CBC from 01/03/2018 showed white count of 4.7, H&H of 8.7/30.5 and an MCV of 71.9 and a platelet count of 366.  Iron studies from 2019 revealed low serum iron of 16, increased TIBC of 655.9 and a low iron saturation of 2%.  TSH from January 2019 were within normal limits  Currently patient reports fatigue and chronic joint pain.  She does have some problems with constipation for which she takes a stool softener.  She also has GERD symptoms for which she takes omeprazole.  ECOG PS- 1  Pain scale- 8- joint pain   Review of systems- Review of Systems  Constitutional: Positive for malaise/fatigue. Negative for chills, fever and weight loss.  HENT: Negative for congestion, ear discharge and nosebleeds.   Eyes: Negative for blurred vision.  Respiratory: Negative for cough, hemoptysis, sputum production,  shortness of breath and wheezing.   Cardiovascular: Negative for chest pain, palpitations, orthopnea and claudication.  Gastrointestinal: Positive for heartburn. Negative for abdominal pain, blood in stool, constipation, diarrhea, melena, nausea and vomiting.  Genitourinary: Negative for dysuria, flank pain, frequency, hematuria and urgency.  Musculoskeletal: Positive for joint pain. Negative for back pain and myalgias.  Skin: Negative for rash.  Neurological: Negative for dizziness, tingling, focal weakness, seizures, weakness and headaches.  Endo/Heme/Allergies: Does not bruise/bleed easily.  Psychiatric/Behavioral: Negative for depression and suicidal ideas. The patient does not have insomnia.     Allergies  Allergen Reactions  . Adhesive [Tape] Other (See Comments)  . Other Rash    Plastic tape    Patient Active Problem List   Diagnosis Date Noted  . Iron deficiency anemia 02/01/2018  . Acute exacerbation of chronic obstructive pulmonary disease (COPD) ([place]) 11/05/2016  . Spinal stenosis, lumbar region, with neurogenic claudication 07/08/2015  . Sacroiliac joint dysfunction 06/28/2015  . Greater trochanteric bursitis 04/10/2015  . Sacroiliac joint disease 03/14/2015  . Facet syndrome, lumbar 03/14/2015  . HTN (hypertension) 03/06/2015  . Thyroid disease 03/06/2015  . DDD (degenerative disc disease), cervical 03/04/2015  . DDD (degenerative disc disease), lumbar 03/04/2015  . DJD (degenerative joint disease) of knee 03/04/2015     Past Medical History:  Diagnosis Date  . Allergy   . Asthma   . Back pain   . Bronchitis   . COPD (chronic obstructive pulmonary disease) ([place])   . Degenerative disc disease, lumbar   . Depression   . FHx: cholecystectomy   . GERD (gastroesophageal reflux disease)   .  H/O: hysterectomy   . Headache   . Hypertension   . Murmur   . Osteoporosis   . Pneumonia 03/31/16   being treated by PCP  . Restless legs syndrome   . Scoliosis   .  Shingles   . Thyroid disease      Past Surgical History:  Procedure Laterality Date  . ABDOMINAL HYSTERECTOMY    . APPENDECTOMY    . BACK SURGERY    . CHOLECYSTECTOMY    . COLONOSCOPY WITH PROPOFOL N/A 01/16/2018   Procedure: COLONOSCOPY WITH PROPOFOL;  Surgeon: Toledo, Benay Pike, MD;  Location: [facility] ENDOSCOPY;  Service: Gastroenterology;  Laterality: N/A;  . ESOPHAGOGASTRODUODENOSCOPY (EGD) WITH PROPOFOL N/A 01/16/2018   Procedure: ESOPHAGOGASTRODUODENOSCOPY (EGD) WITH PROPOFOL;  Surgeon: Toledo, Benay Pike, MD;  Location: [facility] ENDOSCOPY;  Service: Gastroenterology;  Laterality: N/A;  . JOINT REPLACEMENT Left 1998   shoulder  . NECK SURGERY Bilateral   . ROTATOR CUFF REPAIR Right   . SHOULDER OPEN ROTATOR CUFF REPAIR Right   . SHOULDER SURGERY Left    replacement    Social History   Socioeconomic History  . Marital status: Married    Spouse name: Not on file  . Number of children: Not on file  . Years of education: Not on file  . Highest education level: Not on file  Occupational History  . Not on file  Social Needs  . Financial resource strain: Not on file  . Food insecurity:    Worry: Not on file    Inability: Not on file  . Transportation needs:    Medical: Not on file    Non-medical: Not on file  Tobacco Use  . Smoking status: Former Smoker    Packs/day: 1.00    Years: 30.00    Pack years: 30.00    Types: Cigarettes    Last attempt to quit: 11/04/2016    Years since quitting: 1.2  . Smokeless tobacco: Never Used  Substance and Sexual Activity  . Alcohol use: No    Alcohol/week: 0.0 oz  . Drug use: No  . Sexual activity: Not on file  Lifestyle  . Physical activity:    Days per week: Not on file    Minutes per session: Not on file  . Stress: Not on file  Relationships  . Social connections:    Talks on phone: Not on file    Gets together: Not on file    Attends religious service: Not on file    Active member of club or organization: Not on file     Attends meetings of clubs or organizations: Not on file    Relationship status: Not on file  . Intimate partner violence:    Fear of current or ex partner: Not on file    Emotionally abused: Not on file    Physically abused: Not on file    Forced sexual activity: Not on file  Other Topics Concern  . Not on file  Social History Narrative  . Not on file     Family History  Problem Relation Age of Onset  . Kidney disease Father   . Heart disease Mother   . Diabetes Mother      Current Outpatient Medications:  .  Albuterol (VENTOLIN IN), Inhale 2 puffs into the lungs as needed., Disp: , Rfl:  .  alendronate (FOSAMAX) 70 MG tablet, Take 70 mg by mouth once a week. Take with a full glass of water on an empty stomach. On Sunday, Disp: ,  Rfl:  .  amLODipine (NORVASC) 5 MG tablet, Take 5 mg by mouth daily., Disp: , Rfl:  .  Cholecalciferol (VITAMIN D) 2000 UNITS tablet, Take 2,000 Units by mouth daily., Disp: , Rfl:  .  citalopram (CELEXA) 20 MG tablet, Take 40 mg by mouth daily. , Disp: , Rfl:  .  gabapentin (NEURONTIN) 300 MG capsule, Take 300 mg by mouth 4 (four) times daily., Disp: , Rfl:  .  levothyroxine (SYNTHROID, LEVOTHROID) 112 MCG tablet, Take 112 mcg by mouth daily before breakfast., Disp: , Rfl:  .  metoprolol (LOPRESSOR) 100 MG tablet, Take 100 mg by mouth 2 (two) times daily., Disp: , Rfl:  .  Multiple Vitamin (MULTIVITAMIN) tablet, Take 1 tablet by mouth daily. Reported on 04/05/2016, Disp: , Rfl:  .  omeprazole (PRILOSEC) 20 MG capsule, Take 20 mg by mouth 2 (two) times daily before a meal. , Disp: , Rfl:  .  potassium chloride SA (K-DUR,KLOR-CON) 20 MEQ tablet, Take 20 mEq by mouth daily., Disp: , Rfl:  .  senna-docusate (SENOKOT-S) 8.6-50 MG per tablet, Take 1 tablet by mouth daily., Disp: , Rfl:  .  tiotropium (SPIRIVA HANDIHALER) 18 MCG inhalation capsule, Place 1 capsule (18 mcg total) into inhaler and inhale daily., Disp: 30 capsule, Rfl: 12 .  meloxicam (MOBIC) 7.5 MG  tablet, Take 7.5 mg by mouth daily. Reported on 05/17/2016, Disp: , Rfl:  .  umeclidinium-vilanterol (ANORO ELLIPTA) 62.5-25 MCG/INH AEPB, Inhale 1 puff into the lungs daily. (Patient not taking: Reported on 11/14/2017), Disp: 1 each, Rfl: 1   Physical exam:  Vitals:   02/01/18 1332 02/01/18 1333  BP: 112/75   Pulse: 73   Resp: 18   Temp: 98.2 F (36.8 C)   TempSrc: Tympanic   SpO2:  92%  Weight: 193 lb 8 oz (87.8 kg)   Height: 4\' 6"  (1.372 m)    Physical Exam  Constitutional: She is oriented to person, place, and time.  She ambulates with a walker  HENT:  Head: Normocephalic and atraumatic.  Eyes: Pupils are equal, round, and reactive to light. EOM are normal.  Neck: Normal range of motion.  Cardiovascular: Normal rate, regular rhythm and normal heart sounds.  Pulmonary/Chest: Effort normal and breath sounds normal.  Abdominal: Soft. Bowel sounds are normal.  Neurological: She is alert and oriented to person, place, and time.  Skin: Skin is warm and dry.       CMP Latest Ref Rng & Units 11/06/2016  Glucose 65 - 99 mg/dL 119(H)  BUN 6 - 20 mg/dL 11  Creatinine 0.44 - 1.00 mg/dL 0.44  Sodium 135 - 145 mmol/L 131(L)  Potassium 3.5 - 5.1 mmol/L 4.4  Chloride 101 - 111 mmol/L 97(L)  CO2 22 - 32 mmol/L 26  Calcium 8.9 - 10.3 mg/dL 8.6(L)  Total Protein 6.5 - 8.1 g/dL -  Total Bilirubin 0.3 - 1.2 mg/dL -  Alkaline Phos 38 - 126 U/L -  AST 15 - 41 U/L -  ALT 14 - 54 U/L -   CBC Latest Ref Rng & Units 02/01/2018  WBC 3.6 - 11.0 K/uL 8.0  Hemoglobin 12.0 - 16.0 g/dL 10.2(L)  Hematocrit 35.0 - 47.0 % 32.3(L)  Platelets 150 - 440 K/uL 299     Assessment and plan- Patient is a 72 y.o. female referred for iron deficiency anemia etiology  Most recent anemia workup from March 2019 revealed evidence of severe iron deficiency anemia given that her hemoglobin is close to 8 with microcytosis  and iron studies that were consistent with iron deficiency.  Patient is already undergone a  complete GI workup including EGD colonoscopy and capsule endoscopy which did not reveal any evidence of bleeding in March 2019.  Given that she has significant anemia we will proceed with 2 doses of Feraheme 510 mg IV given week.  Discussed risks and benefits of Feraheme including all but not limited to headache, leg swelling and possible risk of infusion reactions.  Patient understands and agrees to proceed.    I will be checking CBC with differential along with iron studies, B12, folate, celiac disease panel, urinalysis and stool H. pylori antigen at this time.  I will see her back in 2 months time with repeat CBC.  And iron studies  Thank you for this kind referral and the opportunity to participate in the care of this patient   Visit Diagnosis 1. Iron deficiency anemia, unspecified iron deficiency anemia type     Dr. Randa Evens, MD, MPH [facility] at [facility] [phone] 02/04/2018  8:44 AM

## 2018-02-05 LAB — CELIAC DISEASE PANEL
Endomysial Ab, IgA: NEGATIVE
IgA: 185 mg/dL (ref 87–352)
Tissue Transglutaminase Ab, IgA: <2 U/mL (ref 0–3)

## 2018-02-07 ENCOUNTER — Inpatient Hospital Stay: Payer: PPO

## 2018-02-15 ENCOUNTER — Ambulatory Visit: Payer: PPO | Admitting: Oncology

## 2018-02-15 ENCOUNTER — Other Ambulatory Visit: Payer: PPO

## 2018-03-07 ENCOUNTER — Encounter: Payer: Self-pay | Admitting: Internal Medicine

## 2018-03-07 ENCOUNTER — Telehealth: Payer: Self-pay | Admitting: Internal Medicine

## 2018-03-07 NOTE — Telephone Encounter (Signed)
3 attempts to schedule fu appt from recall list.   Deleting recall.  Mailed Letter

## 2018-04-04 ENCOUNTER — Telehealth: Payer: Self-pay | Admitting: *Deleted

## 2018-04-04 NOTE — Telephone Encounter (Signed)
I received a message that pt cancelling her appts for the future.  Here is in basket message sent to me:   Jan Fireman, MD  Cc: Luella Cook, RN  Phone Number: [phone]        [place]   Returned patient call regarding her upcoming scheduled appt and was advised to cancel all future appts, as per her decision to discontinue Medical Care/Treatment at this time.  All appts cancelled per patient request.    [name]

## 2018-04-09 ENCOUNTER — Other Ambulatory Visit: Payer: PPO

## 2018-04-09 ENCOUNTER — Ambulatory Visit: Payer: PPO | Admitting: Oncology

## 2018-05-01 DIAGNOSIS — R590 Localized enlarged lymph nodes: Secondary | ICD-10-CM | POA: Diagnosis not present

## 2018-05-28 DIAGNOSIS — Z5181 Encounter for therapeutic drug level monitoring: Secondary | ICD-10-CM | POA: Diagnosis not present

## 2018-06-04 DIAGNOSIS — J4520 Mild intermittent asthma, uncomplicated: Secondary | ICD-10-CM | POA: Diagnosis not present

## 2018-06-04 DIAGNOSIS — G8929 Other chronic pain: Secondary | ICD-10-CM | POA: Diagnosis not present

## 2018-06-04 DIAGNOSIS — J410 Simple chronic bronchitis: Secondary | ICD-10-CM | POA: Diagnosis not present

## 2018-06-04 DIAGNOSIS — Z13220 Encounter for screening for lipoid disorders: Secondary | ICD-10-CM | POA: Diagnosis not present

## 2018-06-04 DIAGNOSIS — I10 Essential (primary) hypertension: Secondary | ICD-10-CM | POA: Diagnosis not present

## 2018-06-04 DIAGNOSIS — E034 Atrophy of thyroid (acquired): Secondary | ICD-10-CM | POA: Diagnosis not present

## 2018-06-04 DIAGNOSIS — M545 Low back pain: Secondary | ICD-10-CM | POA: Diagnosis not present

## 2018-06-04 DIAGNOSIS — M810 Age-related osteoporosis without current pathological fracture: Secondary | ICD-10-CM | POA: Diagnosis not present

## 2018-09-24 ENCOUNTER — Emergency Department: Payer: PPO

## 2018-09-24 ENCOUNTER — Other Ambulatory Visit: Payer: Self-pay

## 2018-09-24 ENCOUNTER — Encounter: Payer: Self-pay | Admitting: Emergency Medicine

## 2018-09-24 ENCOUNTER — Emergency Department
Admission: EM | Admit: 2018-09-24 | Discharge: 2018-09-24 | Disposition: A | Discharge status: Home / Self Care | Payer: PPO | Attending: Emergency Medicine | Admitting: Emergency Medicine

## 2018-09-24 DIAGNOSIS — R0781 Pleurodynia: Secondary | ICD-10-CM | POA: Diagnosis not present

## 2018-09-24 DIAGNOSIS — S8001XA Contusion of right knee, initial encounter: Secondary | ICD-10-CM | POA: Diagnosis not present

## 2018-09-24 DIAGNOSIS — M25561 Pain in right knee: Secondary | ICD-10-CM | POA: Insufficient documentation

## 2018-09-24 DIAGNOSIS — J449 Chronic obstructive pulmonary disease, unspecified: Secondary | ICD-10-CM | POA: Diagnosis not present

## 2018-09-24 DIAGNOSIS — W19XXXA Unspecified fall, initial encounter: Secondary | ICD-10-CM

## 2018-09-24 DIAGNOSIS — Y92010 Kitchen of single-family (private) house as the place of occurrence of the external cause: Secondary | ICD-10-CM | POA: Insufficient documentation

## 2018-09-24 DIAGNOSIS — Z79899 Other long term (current) drug therapy: Secondary | ICD-10-CM | POA: Diagnosis not present

## 2018-09-24 DIAGNOSIS — M25532 Pain in left wrist: Secondary | ICD-10-CM | POA: Diagnosis not present

## 2018-09-24 DIAGNOSIS — S60212A Contusion of left wrist, initial encounter: Secondary | ICD-10-CM | POA: Diagnosis not present

## 2018-09-24 DIAGNOSIS — S8991XA Unspecified injury of right lower leg, initial encounter: Secondary | ICD-10-CM | POA: Diagnosis not present

## 2018-09-24 DIAGNOSIS — Z87891 Personal history of nicotine dependence: Secondary | ICD-10-CM | POA: Insufficient documentation

## 2018-09-24 DIAGNOSIS — T07XXXA Unspecified multiple injuries, initial encounter: Secondary | ICD-10-CM | POA: Diagnosis not present

## 2018-09-24 DIAGNOSIS — Y93G1 Activity, food preparation and clean up: Secondary | ICD-10-CM | POA: Insufficient documentation

## 2018-09-24 DIAGNOSIS — S299XXA Unspecified injury of thorax, initial encounter: Secondary | ICD-10-CM | POA: Diagnosis not present

## 2018-09-24 DIAGNOSIS — Z96612 Presence of left artificial shoulder joint: Secondary | ICD-10-CM | POA: Insufficient documentation

## 2018-09-24 DIAGNOSIS — W010XXA Fall on same level from slipping, tripping and stumbling without subsequent striking against object, initial encounter: Secondary | ICD-10-CM | POA: Insufficient documentation

## 2018-09-24 DIAGNOSIS — S6992XA Unspecified injury of left wrist, hand and finger(s), initial encounter: Secondary | ICD-10-CM | POA: Diagnosis not present

## 2018-09-24 DIAGNOSIS — E079 Disorder of thyroid, unspecified: Secondary | ICD-10-CM | POA: Diagnosis not present

## 2018-09-24 DIAGNOSIS — Y998 Other external cause status: Secondary | ICD-10-CM | POA: Insufficient documentation

## 2018-09-24 DIAGNOSIS — I10 Essential (primary) hypertension: Secondary | ICD-10-CM | POA: Diagnosis not present

## 2018-09-24 NOTE — ED Provider Notes (Signed)
[facility] Emergency Department Provider Note  Time seen: 8:25 PM  I have reviewed the triage vital signs and the nursing notes.   HISTORY  Chief Complaint Fall    HPI [name] is a 72 y.o. female with a past medical history of COPD, gastric reflux, hypertension, presents to the emergency department after a fall.  According to the patient around 6:00 tonight she was getting dinner ready when she tripped falling forwards.  Believe she hit her head but is not entirely sure, caught herself on her knees and wrists.  Denies LOC.  Denies anticoagulation.  Patient's major pain complaints currently are the left wrist and right knee.  States moderate pain in both, has been ambulatory since the fall.   Past Medical History:  Diagnosis Date  . Allergy   . Asthma   . Back pain   . Bronchitis   . COPD (chronic obstructive pulmonary disease) ([place])   . Degenerative disc disease, lumbar   . Depression   . FHx: cholecystectomy   . GERD (gastroesophageal reflux disease)   . H/O: hysterectomy   . Headache   . Hypertension   . Murmur   . Osteoporosis   . Pneumonia 03/31/16   being treated by PCP  . Restless legs syndrome   . Scoliosis   . Shingles   . Thyroid disease     Patient Active Problem List   Diagnosis Date Noted  . Iron deficiency anemia 02/01/2018  . Acute exacerbation of chronic obstructive pulmonary disease (COPD) ([place]) 11/05/2016  . Spinal stenosis, lumbar region, with neurogenic claudication 07/08/2015  . Sacroiliac joint dysfunction 06/28/2015  . Greater trochanteric bursitis 04/10/2015  . Sacroiliac joint disease 03/14/2015  . Facet syndrome, lumbar 03/14/2015  . HTN (hypertension) 03/06/2015  . Thyroid disease 03/06/2015  . DDD (degenerative disc disease), cervical 03/04/2015  . DDD (degenerative disc disease), lumbar 03/04/2015  . DJD (degenerative joint disease) of knee 03/04/2015    Past Surgical History:  Procedure Laterality  Date  . ABDOMINAL HYSTERECTOMY    . APPENDECTOMY    . BACK SURGERY    . CHOLECYSTECTOMY    . COLONOSCOPY WITH PROPOFOL N/A 01/16/2018   Procedure: COLONOSCOPY WITH PROPOFOL;  Surgeon: Toledo, Benay Pike, MD;  Location: [facility] ENDOSCOPY;  Service: Gastroenterology;  Laterality: N/A;  . ESOPHAGOGASTRODUODENOSCOPY (EGD) WITH PROPOFOL N/A 01/16/2018   Procedure: ESOPHAGOGASTRODUODENOSCOPY (EGD) WITH PROPOFOL;  Surgeon: Toledo, Benay Pike, MD;  Location: [facility] ENDOSCOPY;  Service: Gastroenterology;  Laterality: N/A;  . JOINT REPLACEMENT Left 1998   shoulder  . NECK SURGERY Bilateral   . ROTATOR CUFF REPAIR Right   . SHOULDER OPEN ROTATOR CUFF REPAIR Right   . SHOULDER SURGERY Left    replacement    Prior to Admission medications   Medication Sig Start Date End Date Taking? Authorizing Provider  Albuterol (VENTOLIN IN) Inhale 2 puffs into the lungs as needed.    [provider]  alendronate (FOSAMAX) 70 MG tablet Take 70 mg by mouth once a week. Take with a full glass of water on an empty stomach. On Sunday    [provider]  amLODipine (NORVASC) 5 MG tablet Take 5 mg by mouth daily.    [provider]  Cholecalciferol (VITAMIN D) 2000 UNITS tablet Take 2,000 Units by mouth daily.    [provider]  citalopram (CELEXA) 20 MG tablet Take 40 mg by mouth daily.     [provider]  gabapentin (NEURONTIN) 300 MG capsule Take  300 mg by mouth 4 (four) times daily.    [provider]  levothyroxine (SYNTHROID, LEVOTHROID) 112 MCG tablet Take 112 mcg by mouth daily before breakfast.    [provider]  meloxicam (MOBIC) 7.5 MG tablet Take 7.5 mg by mouth daily. Reported on 05/17/2016    [provider]  metoprolol (LOPRESSOR) 100 MG tablet Take 100 mg by mouth 2 (two) times daily.    [provider]  Multiple Vitamin (MULTIVITAMIN) tablet Take 1 tablet by mouth daily. Reported on 04/05/2016    [provider]   omeprazole (PRILOSEC) 20 MG capsule Take 20 mg by mouth 2 (two) times daily before a meal.     [provider]  potassium chloride SA (K-DUR,KLOR-CON) 20 MEQ tablet Take 20 mEq by mouth daily.    [provider]  senna-docusate (SENOKOT-S) 8.6-50 MG per tablet Take 1 tablet by mouth daily.    [provider]  tiotropium (SPIRIVA HANDIHALER) 18 MCG inhalation capsule Place 1 capsule (18 mcg total) into inhaler and inhale daily. 11/14/17   Laverle Hobby, MD  umeclidinium-vilanterol (ANORO ELLIPTA) 62.5-25 MCG/INH AEPB Inhale 1 puff into the lungs daily. Patient not taking: Reported on 11/14/2017 11/08/16   Awilda Bill, NP    Allergies  Allergen Reactions  . Adhesive [Tape] Other (See Comments)  . Other Rash    Plastic tape    Family History  Problem Relation Age of Onset  . Kidney disease Father   . Heart disease Mother   . Diabetes Mother     Social History Social History   Tobacco Use  . Smoking status: Former Smoker    Packs/day: 1.00    Years: 30.00    Pack years: 30.00    Types: Cigarettes    Last attempt to quit: 11/04/2016    Years since quitting: 1.8  . Smokeless tobacco: Never Used  Substance Use Topics  . Alcohol use: No    Alcohol/week: 0.0 standard drinks  . Drug use: No    Review of Systems Constitutional: Negative for fever. Cardiovascular: Slight left chest wall pain. Respiratory: Negative for shortness of breath. Gastrointestinal: Negative for abdominal pain Genitourinary: Negative for urinary compaints Musculoskeletal: Right knee pain, left wrist pain Skin: Negative for skin complaints  Neurological: Negative for headache All other ROS negative  ____________________________________________   PHYSICAL EXAM:  VITAL SIGNS: ED Triage Vitals  Enc Vitals Group     BP 09/24/18 1927 (!) 162/87     Pulse Rate 09/24/18 1927 73     Resp 09/24/18 1927 18     Temp 09/24/18 1927 98.1 F (36.7 C)     Temp Source  09/24/18 1927 Oral     SpO2 09/24/18 1927 95 %     Weight 09/24/18 1928 200 lb (90.7 kg)     Height 09/24/18 1928 4\' 11"  (1.499 m)     Head Circumference --      Peak Flow --      Pain Score 09/24/18 1928 7     Pain Loc --      Pain Edu? --      Excl. in [place]? --    Constitutional: Alert and oriented. Well appearing and in no distress. Eyes: Normal exam ENT   Head: Normocephalic and atraumatic.  No hematoma or abrasions.   Mouth/Throat: Mucous membranes are moist. Cardiovascular: Normal rate, regular rhythm. Respiratory: Normal respiratory effort without tachypnea nor retractions. Breath sounds are clear  Gastrointestinal: Soft and nontender. No distention.  Musculoskeletal: Good range of motion both legs and upper extremities.  Mild tenderness over the right knee, no tenderness elicited in the left wrist.  Good grip strength. Neurologic:  Normal speech and language. No gross focal neurologic deficits Skin:  Skin is warm, dry and intact.  Psychiatric: Mood and affect are normal.   ____________________________________________   RADIOLOGY  X-rays are negative  ____________________________________________   INITIAL IMPRESSION / ASSESSMENT AND PLAN / ED COURSE  Pertinent labs & imaging results that were available during my care of the patient were reviewed by me and considered in my medical decision making (see chart for details).  Patient presents to the emergency department after mechanical fall.  No LOC, no anticoagulation.  Overall the patient appears well, good range of motion in all extremities.  Is complaining of pain in the right knee left wrist and somewhat in the left lateral chest.  X-rays are essentially negative for acute abnormality.  Is not sure if she hit her head, no signs of trauma on examination no anticoagulation I believe we can hold off on head CT at this time.  Patient agreeable to that plan.  I discussed Tylenol as needed for pain control.  Patient  agreeable to plan of care.  ____________________________________________   FINAL CLINICAL IMPRESSION(S) / ED DIAGNOSES  Fall Contusions    Harvest Dark, MD 09/24/18 2028

## 2018-09-24 NOTE — ED Notes (Signed)
Pt to the er for injuries sustained in a fall. Pt was in the kitchen cooking when she tripped over an area rug and fell face first. Pt reports pain to the forehead, nose, right knee, under the left breast, left wrist and upper lip. Pt has some bruising to the forehead but none to the lip, wrist or chest. Full ROM to the left wrist. Mild abrasion to the right knee.

## 2018-09-24 NOTE — ED Triage Notes (Signed)
Pt to triage via w/c with no distress noted; reports tripped in kitchen and fell face forward; c/o pain left wrist, right knee, left lateral ribcage and frontal HA; denies LOC

## 2018-09-27 ENCOUNTER — Other Ambulatory Visit: Payer: Self-pay

## 2018-09-27 ENCOUNTER — Emergency Department: Payer: PPO

## 2018-09-27 ENCOUNTER — Emergency Department
Admission: EM | Admit: 2018-09-27 | Discharge: 2018-09-27 | Disposition: A | Discharge status: Home / Self Care | Payer: PPO | Attending: Emergency Medicine | Admitting: Emergency Medicine

## 2018-09-27 DIAGNOSIS — Z87891 Personal history of nicotine dependence: Secondary | ICD-10-CM | POA: Diagnosis not present

## 2018-09-27 DIAGNOSIS — R0789 Other chest pain: Secondary | ICD-10-CM | POA: Diagnosis not present

## 2018-09-27 DIAGNOSIS — I10 Essential (primary) hypertension: Secondary | ICD-10-CM | POA: Insufficient documentation

## 2018-09-27 DIAGNOSIS — J45909 Unspecified asthma, uncomplicated: Secondary | ICD-10-CM | POA: Insufficient documentation

## 2018-09-27 DIAGNOSIS — R071 Chest pain on breathing: Secondary | ICD-10-CM

## 2018-09-27 DIAGNOSIS — R079 Chest pain, unspecified: Secondary | ICD-10-CM | POA: Diagnosis not present

## 2018-09-27 DIAGNOSIS — J449 Chronic obstructive pulmonary disease, unspecified: Secondary | ICD-10-CM | POA: Insufficient documentation

## 2018-09-27 DIAGNOSIS — R0602 Shortness of breath: Secondary | ICD-10-CM | POA: Diagnosis not present

## 2018-09-27 DIAGNOSIS — Z79899 Other long term (current) drug therapy: Secondary | ICD-10-CM | POA: Diagnosis not present

## 2018-09-27 DIAGNOSIS — M549 Dorsalgia, unspecified: Secondary | ICD-10-CM | POA: Diagnosis not present

## 2018-09-27 LAB — BASIC METABOLIC PANEL
Anion gap: 5 (ref 5–15)
BUN: 13 mg/dL (ref 8–23)
CO2: 28 mmol/L (ref 22–32)
Calcium: 9.2 mg/dL (ref 8.9–10.3)
Chloride: 102 mmol/L (ref 98–111)
Creatinine, Ser: 0.64 mg/dL (ref 0.44–1.00)
GFR calc Af Amer: >60 mL/min (ref >60)
GFR calc non Af Amer: >60 mL/min (ref >60)
Glucose, Bld: 103 mg/dL — ABNORMAL HIGH (ref 70–99)
Potassium: 4.5 mmol/L (ref 3.5–5.1)
Sodium: 135 mmol/L (ref 135–145)

## 2018-09-27 LAB — CBC
HCT: 39.8 % (ref 36.0–46.0)
Hemoglobin: 12.8 g/dL (ref 12.0–15.0)
MCH: 27.9 pg (ref 26.0–34.0)
MCHC: 32.2 g/dL (ref 30.0–36.0)
MCV: 86.7 fL (ref 80.0–100.0)
Platelets: 246 10*3/uL (ref 150–400)
RBC: 4.59 MIL/uL (ref 3.87–5.11)
RDW: 14.8 % (ref 11.5–15.5)
WBC: 5 10*3/uL (ref 4.0–10.5)
nRBC: 0 % (ref 0.0–0.2)

## 2018-09-27 LAB — TROPONIN I
Troponin I: <0.03 ng/mL (ref <0.03)
Troponin I: <0.03 ng/mL (ref <0.03)

## 2018-09-27 MED ORDER — ASPIRIN 81 MG PO CHEW
324.0000 mg | CHEWABLE_TABLET | Freq: Once | ORAL | Status: AC
  Administered 2018-09-27: 324 mg via ORAL
  Filled 2018-09-27: qty 4

## 2018-09-27 MED ORDER — HYDROCODONE-ACETAMINOPHEN 5-325 MG PO TABS: 1.0000 | ORAL_TABLET | Freq: Four times a day (QID) | ORAL | 0 refills | Status: DC | PRN

## 2018-09-27 MED ORDER — IOHEXOL 350 MG/ML SOLN
75.0000 mL | Freq: Once | INTRAVENOUS | Status: AC | PRN
  Administered 2018-09-27: 75 mL via INTRAVENOUS

## 2018-09-27 NOTE — ED Provider Notes (Signed)
[facility] Emergency Department Provider Note   ____________________________________________   First MD Initiated Contact with Patient 09/27/18 1510     (approximate)  I have reviewed the triage vital signs and the nursing notes.   HISTORY  Chief Complaint Chest Pain   HPI [name] is a 72 y.o. female patient reports she has had occasional left-sided chest pain possibly with exertion in the past but today she woke up with it.  Is a deep achy pain in the left chest worse with deep breathing may be worse with exertion as well.  Patient says she does not exert herself much.  Pain is better than it was this morning but still worse with deep breathing.  Has an occasional cough but nothing new no fever the cough is not productive she has a history of COPD.  Patient reports she fell on the 26th and hit her left chest on the floor at that time.  She was seen here and told nothing was found.   Past Medical History:  Diagnosis Date  . Allergy   . Asthma   . Back pain   . Bronchitis   . COPD (chronic obstructive pulmonary disease) ([place])   . Degenerative disc disease, lumbar   . Depression   . FHx: cholecystectomy   . GERD (gastroesophageal reflux disease)   . H/O: hysterectomy   . Headache   . Hypertension   . Murmur   . Osteoporosis   . Pneumonia 03/31/16   being treated by PCP  . Restless legs syndrome   . Scoliosis   . Shingles   . Thyroid disease     Patient Active Problem List   Diagnosis Date Noted  . Iron deficiency anemia 02/01/2018  . Acute exacerbation of chronic obstructive pulmonary disease (COPD) ([place]) 11/05/2016  . Spinal stenosis, lumbar region, with neurogenic claudication 07/08/2015  . Sacroiliac joint dysfunction 06/28/2015  . Greater trochanteric bursitis 04/10/2015  . Sacroiliac joint disease 03/14/2015  . Facet syndrome, lumbar 03/14/2015  . HTN (hypertension) 03/06/2015  . Thyroid disease 03/06/2015  . DDD  (degenerative disc disease), cervical 03/04/2015  . DDD (degenerative disc disease), lumbar 03/04/2015  . DJD (degenerative joint disease) of knee 03/04/2015    Past Surgical History:  Procedure Laterality Date  . ABDOMINAL HYSTERECTOMY    . APPENDECTOMY    . BACK SURGERY    . CHOLECYSTECTOMY    . COLONOSCOPY WITH PROPOFOL N/A 01/16/2018   Procedure: COLONOSCOPY WITH PROPOFOL;  Surgeon: Toledo, Benay Pike, MD;  Location: [facility] ENDOSCOPY;  Service: Gastroenterology;  Laterality: N/A;  . ESOPHAGOGASTRODUODENOSCOPY (EGD) WITH PROPOFOL N/A 01/16/2018   Procedure: ESOPHAGOGASTRODUODENOSCOPY (EGD) WITH PROPOFOL;  Surgeon: Toledo, Benay Pike, MD;  Location: [facility] ENDOSCOPY;  Service: Gastroenterology;  Laterality: N/A;  . JOINT REPLACEMENT Left 1998   shoulder  . NECK SURGERY Bilateral   . ROTATOR CUFF REPAIR Right   . SHOULDER OPEN ROTATOR CUFF REPAIR Right   . SHOULDER SURGERY Left    replacement    Prior to Admission medications   Medication Sig Start Date End Date Taking? Authorizing Provider  albuterol (PROVENTIL HFA;VENTOLIN HFA) 108 (90 Base) MCG/ACT inhaler Inhale 1-2 puffs into the lungs every 6 (six) hours as needed for wheezing or shortness of breath.   Yes [provider]  alendronate (FOSAMAX) 70 MG tablet Take 70 mg by mouth once a week. Take with a full glass of water on an empty stomach. On Sunday   Yes [provider]  amLODipine (NORVASC) 5 MG tablet Take 5 mg by mouth daily.   Yes [provider]  Cholecalciferol (VITAMIN D) 2000 UNITS tablet Take 2,000 Units by mouth daily.   Yes [provider]  citalopram (CELEXA) 40 MG tablet Take 40 mg by mouth daily.    Yes [provider]  gabapentin (NEURONTIN) 300 MG capsule Take 300 mg by mouth 4 (four) times daily.   Yes [provider]  ibuprofen (ADVIL,MOTRIN) 200 MG tablet Take 400-800 mg by mouth every 6 (six) hours as needed for mild pain or moderate pain.   Yes [provider]  levothyroxine (SYNTHROID, LEVOTHROID) 112 MCG tablet Take 112 mcg by mouth daily before breakfast.   Yes [provider]  metoprolol (LOPRESSOR) 100 MG tablet Take 100 mg by mouth 2 (two) times daily.   Yes [provider]  Multiple Vitamin (MULTIVITAMIN) tablet Take 1 tablet by mouth daily. Reported on 04/05/2016   Yes [provider]  omeprazole (PRILOSEC) 20 MG capsule Take 20 mg by mouth 2 (two) times daily before a meal.    Yes [provider]  potassium chloride SA (K-DUR,KLOR-CON) 20 MEQ tablet Take 20 mEq by mouth daily.   Yes [provider]  tiotropium (SPIRIVA HANDIHALER) 18 MCG inhalation capsule Place 1 capsule (18 mcg total) into inhaler and inhale daily. 11/14/17  Yes Laverle Hobby, MD  HYDROcodone-acetaminophen (NORCO/VICODIN) 5-325 MG tablet Take 1 tablet by mouth every 6 (six) hours as needed for moderate pain. 09/27/18   Nena Polio, MD  umeclidinium-vilanterol (ANORO ELLIPTA) 62.5-25 MCG/INH AEPB Inhale 1 puff into the lungs daily. Patient not taking: Reported on 11/14/2017 11/08/16   Awilda Bill, NP    Allergies Adhesive [tape] and Other  Family History  Problem Relation Age of Onset  . Kidney disease Father   . Heart disease Mother   . Diabetes Mother     Social History Social History   Tobacco Use  . Smoking status: Former Smoker    Packs/day: 1.00    Years: 30.00    Pack years: 30.00    Types: Cigarettes    Last attempt to quit: 11/04/2016    Years since quitting: 1.8  . Smokeless tobacco: Never Used  Substance Use Topics  . Alcohol use: No    Alcohol/week: 0.0 standard drinks  . Drug use: No    Review of Systems  Constitutional: No fever/chills Eyes: No visual changes. ENT: No sore throat. Cardiovascular chest pain. Respiratory: At baseline shortness of breath. Gastrointestinal: No abdominal pain.  No nausea, no vomiting.  No diarrhea.  No constipation. Genitourinary:  Negative for dysuria. Musculoskeletal: At baseline back pain. Skin: Negative for rash. Neurological: Negative for headaches, focal weakness   ____________________________________________   PHYSICAL EXAM:  VITAL SIGNS: ED Triage Vitals [09/27/18 1122]  Enc Vitals Group     BP 130/67     Pulse Rate 62     Resp 18     Temp 97.9 F (36.6 C)     Temp Source Oral     SpO2 92 %     Weight 200 lb (90.7 kg)     Height 4\' 9"  (1.448 m)     Head Circumference      Peak Flow      Pain Score 5     Pain Loc      Pain Edu?      Excl. in [place]?     Constitutional: Alert and oriented. Well appearing and  in no acute distress. Eyes: Conjunctivae are normal. Head: Atraumatic. Nose: No congestion/rhinnorhea. Mouth/Throat: Mucous membranes are moist.  Oropharynx non-erythematous. Neck: No stridor.   Cardiovascular: Normal rate, regular rhythm. Grossly normal heart sounds.  Good peripheral circulation. Respiratory: Normal respiratory effort.  No retractions. Lungs CTAB.  Left chest is somewhat tender to palpation I do not see any bruises Gastrointestinal: Soft and nontender. No distention. No abdominal bruits. No CVA tenderness. Musculoskeletal: No lower extremity tenderness nor edema.   Neurologic:  Normal speech and language. No gross focal neurologic deficits are appreciated.  Skin:  Skin is warm, dry and intact. No rash noted. Psychiatric: Mood and affect are normal. Speech and behavior are normal.  ____________________________________________   LABS (all labs ordered are listed, but only abnormal results are displayed)  Labs Reviewed  BASIC METABOLIC PANEL - Abnormal; Notable for the following components:      Result Value   Glucose, Bld 103 (*)    All other components within normal limits  CBC  TROPONIN I  TROPONIN I   ____________________________________________  EKG  EKG read and interpreted by me shows normal sinus rhythm rate of 61 left axis no acute ST-T wave  changes ____________________________________________  RADIOLOGY  ED MD interpretation: Chest x-ray read by radiology reviewed by me shows a left atelectasis or infiltrate more like an infiltrate to me  Official radiology report(s): Dg Chest 2 View  Result Date: 09/27/2018 CLINICAL DATA:  Left-sided chest pain. EXAM: CHEST - 2 VIEW COMPARISON:  09/24/2018. FINDINGS: Mediastinum and hilar structures normal. Heart size normal. Left base atelectasis/infiltrate. No pleural effusion or pneumothorax. Hiatal hernia. Prior cervicothoracic spine fusion. Left shoulder replacement. Severe kyphoscoliosis again noted. IMPRESSION: 1.  Left base atelectasis/infiltrate. 2.  Hiatal hernia. Electronically Signed   By: Marcello Moores  Register   On: 09/27/2018 11:50   Ct Angio Chest Pe W And/or Wo Contrast  Result Date: 09/27/2018 CLINICAL DATA:  Chest pain EXAM: CT ANGIOGRAPHY CHEST WITH CONTRAST TECHNIQUE: Multidetector CT imaging of the chest was performed using the standard protocol during bolus administration of intravenous contrast. Multiplanar CT image reconstructions and MIPs were obtained to evaluate the vascular anatomy. CONTRAST:  52mL OMNIPAQUE IOHEXOL 350 MG/ML SOLN COMPARISON:  CTA chest 07/17/2012 FINDINGS: Cardiovascular: --Pulmonary arteries: Contrast injection is sufficient to demonstrate satisfactory opacification of the pulmonary arteries to the segmental level. There is no pulmonary embolus. The main pulmonary artery is within normal limits for size. --Aorta: Satisfactory opacification of the thoracic aorta. No aortic dissection or other acute aortic syndrome. Conventional 3 vessel aortic branching pattern. The aortic course and caliber are normal. There is mild aortic atherosclerosis. --Heart: Normal size. No pericardial effusion. Mediastinum/Nodes: There is an intermediate sized hiatal hernia with approximately half the stomach contained intrathoracically. This is slightly larger than on the scan of  07/17/12. No mediastinal or axillary lymphadenopathy. Lungs/Pleura: No pulmonary nodules or masses. No pleural effusion or pneumothorax. No focal airspace consolidation. No focal pleural abnormality. Upper Abdomen: Contrast bolus timing is not optimized for evaluation of the abdominal organs. Within this limitation, the visualized organs of the upper abdomen are normal. Musculoskeletal: Multiple old left-sided rib fractures. Review of the MIP images confirms the above findings. IMPRESSION: 1. No pulmonary embolus or acute aortic syndrome. 2. Intermediate sized hiatal hernia, which may contribute to reflux. Aortic Atherosclerosis (ICD10-I70.0). Electronically Signed   By: Ulyses Jarred M.D.   On: 09/27/2018 16:33    ____________________________________________   PROCEDURES  Procedure(s) performed:   Procedures  Critical Care performed:  ____________________________________________   INITIAL IMPRESSION / ASSESSMENT AND PLAN / ED COURSE  Patient's x-ray looks like an infiltrate she has pleuritic chest pain but no fever no change in her baseline cough cough is not productive she will troponins negative I will repeat a troponin and CT her chest to see what that density is there.  Chest CT only shows some rib fractures that were felt to be old.  This is in the area where the patient's pain is.  Possibly they are old enough to be from 4 days ago otherwise perhaps she reactivated the pain from falling.  There is no other pathology there that would explain the third of chest pain and pain on palpation that she is having       ____________________________________________   FINAL CLINICAL IMPRESSION(S) / ED DIAGNOSES  Final diagnoses:  Chest pain on breathing   Rib fractures  ED Discharge Orders         Ordered    HYDROcodone-acetaminophen (NORCO/VICODIN) 5-325 MG tablet  Every 6 hours PRN     09/27/18 1712           Note:  This document was prepared using Dragon voice recognition  software and may include unintentional dictation errors.    Nena Polio, MD 09/28/18 [phone]

## 2018-09-27 NOTE — ED Notes (Signed)
Patient transported to CT 

## 2018-09-27 NOTE — Discharge Instructions (Addendum)
Your lab work and EKG look normal CT scan only showed some rib fractures.  They may be from when you fell 4 days ago.  They may be possibly older than that.  They would make your chest ache like this and hurt worse with deep breathing.  I will give you some Vicodin to take 1 pill 4 times a day if the pain is severe.  Otherwise just use regular Tylenol.  Do not take Tylenol and Vicodin together as the Vicodin has Tylenol in it and you can overdose on the Tylenol by accident.  Please return for increasing pain fever or coughing shortness of breath or feeling sicker.  Please follow-up with your regular doctor later this coming week to see how you are doing.  Be careful the Vicodin can make you woozy do not fall.  Do not drive on it either.

## 2018-09-27 NOTE — ED Triage Notes (Signed)
5am began with L CP. Thought indigestion but meds didn't relieve. Pain with deep breath. A&O, in wheelchair. No distress noted. No cardiac hx.

## 2018-10-09 DIAGNOSIS — Z8781 Personal history of (healed) traumatic fracture: Secondary | ICD-10-CM | POA: Diagnosis not present

## 2018-10-26 DIAGNOSIS — J22 Unspecified acute lower respiratory infection: Secondary | ICD-10-CM | POA: Diagnosis not present

## 2018-10-26 DIAGNOSIS — M1712 Unilateral primary osteoarthritis, left knee: Secondary | ICD-10-CM | POA: Diagnosis not present

## 2018-10-26 DIAGNOSIS — M25561 Pain in right knee: Secondary | ICD-10-CM | POA: Diagnosis not present

## 2018-10-26 DIAGNOSIS — M25562 Pain in left knee: Secondary | ICD-10-CM | POA: Diagnosis not present

## 2018-10-26 DIAGNOSIS — R509 Fever, unspecified: Secondary | ICD-10-CM | POA: Diagnosis not present

## 2018-10-26 DIAGNOSIS — J9811 Atelectasis: Secondary | ICD-10-CM | POA: Diagnosis not present

## 2018-10-26 DIAGNOSIS — K449 Diaphragmatic hernia without obstruction or gangrene: Secondary | ICD-10-CM | POA: Diagnosis not present

## 2018-10-26 DIAGNOSIS — M1711 Unilateral primary osteoarthritis, right knee: Secondary | ICD-10-CM | POA: Diagnosis not present

## 2018-10-26 DIAGNOSIS — R05 Cough: Secondary | ICD-10-CM | POA: Diagnosis not present

## 2018-11-28 DIAGNOSIS — E034 Atrophy of thyroid (acquired): Secondary | ICD-10-CM | POA: Diagnosis not present

## 2018-11-28 DIAGNOSIS — Z136 Encounter for screening for cardiovascular disorders: Secondary | ICD-10-CM | POA: Diagnosis not present

## 2018-11-28 DIAGNOSIS — I10 Essential (primary) hypertension: Secondary | ICD-10-CM | POA: Diagnosis not present

## 2018-12-05 DIAGNOSIS — Z0001 Encounter for general adult medical examination with abnormal findings: Secondary | ICD-10-CM | POA: Diagnosis not present

## 2018-12-05 DIAGNOSIS — I10 Essential (primary) hypertension: Secondary | ICD-10-CM | POA: Diagnosis not present

## 2018-12-05 DIAGNOSIS — G8929 Other chronic pain: Secondary | ICD-10-CM | POA: Diagnosis not present

## 2018-12-05 DIAGNOSIS — E034 Atrophy of thyroid (acquired): Secondary | ICD-10-CM | POA: Diagnosis not present

## 2018-12-05 DIAGNOSIS — Z0000 Encounter for general adult medical examination without abnormal findings: Secondary | ICD-10-CM | POA: Diagnosis not present

## 2018-12-05 DIAGNOSIS — M810 Age-related osteoporosis without current pathological fracture: Secondary | ICD-10-CM | POA: Diagnosis not present

## 2018-12-05 DIAGNOSIS — M545 Low back pain: Secondary | ICD-10-CM | POA: Diagnosis not present

## 2018-12-05 DIAGNOSIS — J410 Simple chronic bronchitis: Secondary | ICD-10-CM | POA: Diagnosis not present

## 2018-12-05 DIAGNOSIS — Z1239 Encounter for other screening for malignant neoplasm of breast: Secondary | ICD-10-CM | POA: Diagnosis not present

## 2019-01-08 ENCOUNTER — Emergency Department: Payer: Medicare HMO

## 2019-01-08 ENCOUNTER — Other Ambulatory Visit: Payer: Self-pay

## 2019-01-08 ENCOUNTER — Encounter: Payer: Self-pay | Admitting: Emergency Medicine

## 2019-01-08 ENCOUNTER — Emergency Department
Admission: EM | Admit: 2019-01-08 | Discharge: 2019-01-08 | Disposition: A | Discharge status: Home / Self Care | Payer: Medicare HMO | Attending: Emergency Medicine | Admitting: Emergency Medicine

## 2019-01-08 DIAGNOSIS — Y929 Unspecified place or not applicable: Secondary | ICD-10-CM | POA: Diagnosis not present

## 2019-01-08 DIAGNOSIS — S0101XA Laceration without foreign body of scalp, initial encounter: Secondary | ICD-10-CM | POA: Diagnosis not present

## 2019-01-08 DIAGNOSIS — S199XXA Unspecified injury of neck, initial encounter: Secondary | ICD-10-CM | POA: Diagnosis not present

## 2019-01-08 DIAGNOSIS — Z79899 Other long term (current) drug therapy: Secondary | ICD-10-CM | POA: Diagnosis not present

## 2019-01-08 DIAGNOSIS — Y939 Activity, unspecified: Secondary | ICD-10-CM | POA: Insufficient documentation

## 2019-01-08 DIAGNOSIS — R0902 Hypoxemia: Secondary | ICD-10-CM | POA: Diagnosis not present

## 2019-01-08 DIAGNOSIS — W109XXA Fall (on) (from) unspecified stairs and steps, initial encounter: Secondary | ICD-10-CM | POA: Diagnosis not present

## 2019-01-08 DIAGNOSIS — Z23 Encounter for immunization: Secondary | ICD-10-CM | POA: Insufficient documentation

## 2019-01-08 DIAGNOSIS — J449 Chronic obstructive pulmonary disease, unspecified: Secondary | ICD-10-CM | POA: Insufficient documentation

## 2019-01-08 DIAGNOSIS — M79642 Pain in left hand: Secondary | ICD-10-CM | POA: Diagnosis not present

## 2019-01-08 DIAGNOSIS — S0990XA Unspecified injury of head, initial encounter: Secondary | ICD-10-CM | POA: Diagnosis not present

## 2019-01-08 DIAGNOSIS — Y999 Unspecified external cause status: Secondary | ICD-10-CM | POA: Insufficient documentation

## 2019-01-08 DIAGNOSIS — R52 Pain, unspecified: Secondary | ICD-10-CM | POA: Diagnosis not present

## 2019-01-08 DIAGNOSIS — R062 Wheezing: Secondary | ICD-10-CM | POA: Diagnosis not present

## 2019-01-08 DIAGNOSIS — Z87891 Personal history of nicotine dependence: Secondary | ICD-10-CM | POA: Diagnosis not present

## 2019-01-08 DIAGNOSIS — I10 Essential (primary) hypertension: Secondary | ICD-10-CM | POA: Diagnosis not present

## 2019-01-08 DIAGNOSIS — W19XXXA Unspecified fall, initial encounter: Secondary | ICD-10-CM

## 2019-01-08 DIAGNOSIS — J441 Chronic obstructive pulmonary disease with (acute) exacerbation: Secondary | ICD-10-CM | POA: Diagnosis not present

## 2019-01-08 DIAGNOSIS — S6992XA Unspecified injury of left wrist, hand and finger(s), initial encounter: Secondary | ICD-10-CM | POA: Diagnosis not present

## 2019-01-08 DIAGNOSIS — R51 Headache: Secondary | ICD-10-CM | POA: Diagnosis not present

## 2019-01-08 DIAGNOSIS — S0181XA Laceration without foreign body of other part of head, initial encounter: Secondary | ICD-10-CM | POA: Diagnosis not present

## 2019-01-08 DIAGNOSIS — M79643 Pain in unspecified hand: Secondary | ICD-10-CM | POA: Diagnosis not present

## 2019-01-08 MED ORDER — LIDOCAINE HCL (PF) 1 % IJ SOLN
10.0000 mL | Freq: Once | INTRAMUSCULAR | Status: AC
  Administered 2019-01-08: 10 mL
  Filled 2019-01-08: qty 10

## 2019-01-08 MED ORDER — MORPHINE SULFATE (PF) 4 MG/ML IV SOLN
4.0000 mg | Freq: Once | INTRAVENOUS | Status: AC
  Administered 2019-01-08: 4 mg via INTRAMUSCULAR
  Filled 2019-01-08: qty 1

## 2019-01-08 MED ORDER — TETANUS-DIPHTHERIA TOXOIDS TD 5-2 LFU IM INJ
0.5000 mL | INJECTION | Freq: Once | INTRAMUSCULAR | Status: AC
  Administered 2019-01-08: 0.5 mL via INTRAMUSCULAR
  Filled 2019-01-08: qty 0.5

## 2019-01-08 MED ORDER — IPRATROPIUM-ALBUTEROL 0.5-2.5 (3) MG/3ML IN SOLN
3.0000 mL | Freq: Once | RESPIRATORY_TRACT | Status: AC
  Administered 2019-01-08: 3 mL via RESPIRATORY_TRACT
  Filled 2019-01-08: qty 3

## 2019-01-08 MED ORDER — PREDNISONE 20 MG PO TABS: 40.0000 mg | ORAL_TABLET | Freq: Every day | ORAL | 0 refills | Status: DC

## 2019-01-08 NOTE — ED Notes (Signed)
Pt states she is ready to get discharged. This RN explained delay.

## 2019-01-08 NOTE — ED Notes (Signed)
MD aware of pt dropping 02 saturations of 88% and at bedside. Pt verbalized understanding to return if steroid therapy is ineffective and her sats continue to drop.

## 2019-01-08 NOTE — ED Triage Notes (Signed)
Here for mechanical fall tripping at top of ramp going into house. No blood thinner. No LOC. Laceration above left eye.  Also c/o left hand and wrist pain.

## 2019-01-08 NOTE — ED Notes (Signed)
Patient transported to CT 

## 2019-01-08 NOTE — Discharge Instructions (Addendum)
You have been seen in the emergency department after a fall.  Your work-up is negative for any significant injuries however he did suffer a fairly large laceration to your left forehead.  This has been repaired with absorbable sutures.  Please keep this area dry for the next 48 hours.  Please follow-up with your doctor in the next 1 day for recheck/reevaluation given your lower oxygen level and slight wheeze.  Please use your home nebulizer every 6 hours as needed for shortness of breath.  Please take prednisone as prescribed for the next 5 days.  Return to the emergency department immediately for any shortness of breath or difficulty breathing.

## 2019-01-08 NOTE — ED Provider Notes (Signed)
[facility] Emergency Department Provider Note  Time seen: 4:32 PM  I have reviewed the triage vital signs and the nursing notes.   HISTORY  Chief Complaint Fall    HPI [name] is a 73 y.o. female with a past medical history of asthma, COPD, depression, gastric reflux, hypertension, presents to the emergency department after a fall.  According to the patient she was walking up her steps when she tripped falling forward landing on her face.  Denies loss of consciousness.  No vomiting.  Patient does state mild pain around her head and left cheek.  Minimal left hand pain.  Denies any neck or back pain.  Patient was ambulatory with EMS able to stand up and get onto the stretcher.  Patient states she usually uses a walker when ambulating as she is unsteady at baseline.   Past Medical History:  Diagnosis Date  . Allergy   . Asthma   . Back pain   . Bronchitis   . COPD (chronic obstructive pulmonary disease) ([place])   . Degenerative disc disease, lumbar   . Depression   . FHx: cholecystectomy   . GERD (gastroesophageal reflux disease)   . H/O: hysterectomy   . Headache   . Hypertension   . Murmur   . Osteoporosis   . Pneumonia 03/31/16   being treated by PCP  . Restless legs syndrome   . Scoliosis   . Shingles   . Thyroid disease     Patient Active Problem List   Diagnosis Date Noted  . Iron deficiency anemia 02/01/2018  . Acute exacerbation of chronic obstructive pulmonary disease (COPD) ([place]) 11/05/2016  . Spinal stenosis, lumbar region, with neurogenic claudication 07/08/2015  . Sacroiliac joint dysfunction 06/28/2015  . Greater trochanteric bursitis 04/10/2015  . Sacroiliac joint disease 03/14/2015  . Facet syndrome, lumbar 03/14/2015  . HTN (hypertension) 03/06/2015  . Thyroid disease 03/06/2015  . DDD (degenerative disc disease), cervical 03/04/2015  . DDD (degenerative disc disease), lumbar 03/04/2015  . DJD (degenerative joint disease)  of knee 03/04/2015    Past Surgical History:  Procedure Laterality Date  . ABDOMINAL HYSTERECTOMY    . APPENDECTOMY    . BACK SURGERY    . CHOLECYSTECTOMY    . COLONOSCOPY WITH PROPOFOL N/A 01/16/2018   Procedure: COLONOSCOPY WITH PROPOFOL;  Surgeon: Toledo, Benay Pike, MD;  Location: [facility] ENDOSCOPY;  Service: Gastroenterology;  Laterality: N/A;  . ESOPHAGOGASTRODUODENOSCOPY (EGD) WITH PROPOFOL N/A 01/16/2018   Procedure: ESOPHAGOGASTRODUODENOSCOPY (EGD) WITH PROPOFOL;  Surgeon: Toledo, Benay Pike, MD;  Location: [facility] ENDOSCOPY;  Service: Gastroenterology;  Laterality: N/A;  . JOINT REPLACEMENT Left 1998   shoulder  . NECK SURGERY Bilateral   . ROTATOR CUFF REPAIR Right   . SHOULDER OPEN ROTATOR CUFF REPAIR Right   . SHOULDER SURGERY Left    replacement    Prior to Admission medications   Medication Sig Start Date End Date Taking? Authorizing Provider  albuterol (PROVENTIL HFA;VENTOLIN HFA) 108 (90 Base) MCG/ACT inhaler Inhale 1-2 puffs into the lungs every 6 (six) hours as needed for wheezing or shortness of breath.    [provider]  alendronate (FOSAMAX) 70 MG tablet Take 70 mg by mouth once a week. Take with a full glass of water on an empty stomach. On Sunday    [provider]  amLODipine (NORVASC) 5 MG tablet Take 5 mg by mouth daily.    [provider]  Cholecalciferol (VITAMIN D) 2000 UNITS tablet Take 2,000 Units by  mouth daily.    [provider]  citalopram (CELEXA) 40 MG tablet Take 40 mg by mouth daily.     [provider]  gabapentin (NEURONTIN) 300 MG capsule Take 300 mg by mouth 4 (four) times daily.    [provider]  HYDROcodone-acetaminophen (NORCO/VICODIN) 5-325 MG tablet Take 1 tablet by mouth every 6 (six) hours as needed for moderate pain. 09/27/18   Nena Polio, MD  ibuprofen (ADVIL,MOTRIN) 200 MG tablet Take 400-800 mg by mouth every 6 (six) hours as needed for mild pain or moderate pain.    [provider]  levothyroxine (SYNTHROID, LEVOTHROID) 112 MCG tablet Take 112 mcg by mouth daily before breakfast.    [provider]  metoprolol (LOPRESSOR) 100 MG tablet Take 100 mg by mouth 2 (two) times daily.    [provider]  Multiple Vitamin (MULTIVITAMIN) tablet Take 1 tablet by mouth daily. Reported on 04/05/2016    [provider]  omeprazole (PRILOSEC) 20 MG capsule Take 20 mg by mouth 2 (two) times daily before a meal.     [provider]  potassium chloride SA (K-DUR,KLOR-CON) 20 MEQ tablet Take 20 mEq by mouth daily.    [provider]  tiotropium (SPIRIVA HANDIHALER) 18 MCG inhalation capsule Place 1 capsule (18 mcg total) into inhaler and inhale daily. 11/14/17   Laverle Hobby, MD  umeclidinium-vilanterol (ANORO ELLIPTA) 62.5-25 MCG/INH AEPB Inhale 1 puff into the lungs daily. Patient not taking: Reported on 11/14/2017 11/08/16   Awilda Bill, NP    Allergies  Allergen Reactions  . Adhesive [Tape] Other (See Comments)  . Other Rash    Plastic tape    Family History  Problem Relation Age of Onset  . Kidney disease Father   . Heart disease Mother   . Diabetes Mother     Social History Social History   Tobacco Use  . Smoking status: Former Smoker    Packs/day: 1.00    Years: 30.00    Pack years: 30.00    Types: Cigarettes    Last attempt to quit: 11/04/2016    Years since quitting: 2.1  . Smokeless tobacco: Never Used  Substance Use Topics  . Alcohol use: No    Alcohol/week: 0.0 standard drinks  . Drug use: No    Review of Systems Constitutional: Negative for loss of consciousness ENT: Left facial pain Cardiovascular: Negative for chest pain. Respiratory: Negative for shortness of breath. Gastrointestinal: Negative for abdominal pain Musculoskeletal: Denies neck or back pain.  No extremity pain besides mild left hand pain. Skin: Laceration to left forehead Neurological: Negative for headache All other  ROS negative  ____________________________________________   PHYSICAL EXAM:  Constitutional: Alert and oriented. Well appearing and in no distress. Eyes: Mild periorbital ecchymosis around left eye. ENT   Head: Approximate centimeter laceration to left forehead.  Moderate tenderness below left eye with mild periorbital ecchymosis around left eye   Mouth/Throat: Mucous membranes are moist. Cardiovascular: Normal rate, regular rhythm. Respiratory: Normal respiratory effort without tachypnea nor retractions.  Mild expiratory wheeze. Gastrointestinal: Soft and nontender. No distention.  Musculoskeletal: Nontender with normal range of motion in all extremities.  Neurologic:  Normal speech and language. No gross focal neurologic deficits  Skin: Approximate centimeter laceration to left forehead Psychiatric: Mood and affect are normal.   ____________________________________________   RADIOLOGY  IMPRESSION: CT HEAD:  1. No acute intracranial abnormality. 2. Soft tissue contusion with laceration at the left forehead. No calvarial fracture. 3.  Underlying age-related cerebral atrophy with moderate chronic microvascular ischemic disease.  CT MAXILLOFACIAL:  1. Soft tissue contusion/laceration at the left forehead and left face. No associated maxillofacial fracture. Intact globes with no retro-orbital pathology. 2. Acute on chronic left maxillary and right sphenoid sinusitis.  CT CERVICAL SPINE:  1. No acute traumatic injury within the cervical spine. 2. Prior ACDF at Z7-Q7 without complication. 3. Advanced degenerative osteoarthritic changes about the C1-2 articulation with multilevel left greater than right facet arthrosis.  ____________________________________________   INITIAL IMPRESSION / ASSESSMENT AND PLAN / ED COURSE  Pertinent labs & imaging results that were available during my care of the patient were reviewed by me and considered in my medical decision  making (see chart for details).  Patient presents to the emergency department after mechanical fall.  Patient fell forward landing on her face patient has an approximate 8 cm laceration to her left forehead that is gaping, currently hemostatic.  Patient has moderate tenderness to the left face with mild swelling to this area, mild periorbital ecchymosis to the left eye.  Differential this time would include ICH, facial fractures, laceration.  We will obtain CT imaging of the head face and neck as a precaution.  We will also obtain x-rays of the left hand.  Minimal tenderness along the fifth metacarpal.  No obvious deformity.  Patient CT scans are negative for acute abnormality. X-ray negative for fracture.   Patient continues have a slight expiratory wheeze.  States she has been smoking greater than 40 years, current room air saturation is varying between 89 and 91%.  I discussed this with the patient she still strongly wishes to go home denies any shortness of breath states she feels normal.  We will discharge with short course of prednisone the patient will follow-up with her doctor tomorrow.  I discussed very strict return precautions with the patient which she is agreeable.  LACERATION REPAIR Performed by: Harvest Dark Authorized by: Harvest Dark Consent: Verbal consent obtained. Risks and benefits: risks, benefits and alternatives were discussed Consent given by: patient Patient identity confirmed: provided demographic data Prepped and Draped in normal sterile fashion Wound explored  Laceration Location: left forehead  Laceration Length: 8cm  No Foreign Bodies seen or palpated  Anesthesia: local infiltration  Local anesthetic: lidocaine 1% wo epinephrine  Anesthetic total: 8 ml  Irrigation method: syringe Amount of cleaning: standard  Skin closure: 5-0 rapid vicryl  Number of sutures: 11  Technique: simple interrupted  Patient tolerance: Patient tolerated the  procedure well with no immediate complications.   ____________________________________________   FINAL CLINICAL IMPRESSION(S) / ED DIAGNOSES  Fall Laceration COPD exacerbation   Harvest Dark, MD 01/08/19 1907

## 2019-01-08 NOTE — ED Notes (Signed)
Xray at bedside for portable.

## 2019-01-08 NOTE — ED Notes (Signed)
MD at bedside for sutures

## 2019-01-16 DIAGNOSIS — S0181XD Laceration without foreign body of other part of head, subsequent encounter: Secondary | ICD-10-CM | POA: Diagnosis not present

## 2019-01-16 DIAGNOSIS — J410 Simple chronic bronchitis: Secondary | ICD-10-CM | POA: Diagnosis not present

## 2019-01-16 DIAGNOSIS — W01198A Fall on same level from slipping, tripping and stumbling with subsequent striking against other object, initial encounter: Secondary | ICD-10-CM | POA: Diagnosis not present

## 2019-04-15 ENCOUNTER — Encounter: Payer: Self-pay | Admitting: Emergency Medicine

## 2019-04-15 ENCOUNTER — Other Ambulatory Visit: Payer: Self-pay

## 2019-04-15 ENCOUNTER — Emergency Department: Payer: Medicare Other

## 2019-04-15 ENCOUNTER — Emergency Department
Admission: EM | Admit: 2019-04-15 | Discharge: 2019-04-15 | Disposition: A | Discharge status: Home / Self Care | Payer: Medicare Other | Attending: Emergency Medicine | Admitting: Emergency Medicine

## 2019-04-15 DIAGNOSIS — I10 Essential (primary) hypertension: Secondary | ICD-10-CM | POA: Insufficient documentation

## 2019-04-15 DIAGNOSIS — Y9389 Activity, other specified: Secondary | ICD-10-CM | POA: Insufficient documentation

## 2019-04-15 DIAGNOSIS — Y998 Other external cause status: Secondary | ICD-10-CM | POA: Insufficient documentation

## 2019-04-15 DIAGNOSIS — Z87891 Personal history of nicotine dependence: Secondary | ICD-10-CM | POA: Diagnosis not present

## 2019-04-15 DIAGNOSIS — Z96612 Presence of left artificial shoulder joint: Secondary | ICD-10-CM | POA: Insufficient documentation

## 2019-04-15 DIAGNOSIS — Y929 Unspecified place or not applicable: Secondary | ICD-10-CM | POA: Insufficient documentation

## 2019-04-15 DIAGNOSIS — S6992XA Unspecified injury of left wrist, hand and finger(s), initial encounter: Secondary | ICD-10-CM | POA: Diagnosis present

## 2019-04-15 DIAGNOSIS — W010XXA Fall on same level from slipping, tripping and stumbling without subsequent striking against object, initial encounter: Secondary | ICD-10-CM | POA: Diagnosis not present

## 2019-04-15 DIAGNOSIS — Z79899 Other long term (current) drug therapy: Secondary | ICD-10-CM | POA: Insufficient documentation

## 2019-04-15 DIAGNOSIS — J449 Chronic obstructive pulmonary disease, unspecified: Secondary | ICD-10-CM | POA: Insufficient documentation

## 2019-04-15 DIAGNOSIS — S62102A Fracture of unspecified carpal bone, left wrist, initial encounter for closed fracture: Secondary | ICD-10-CM | POA: Diagnosis not present

## 2019-04-15 MED ORDER — TRAMADOL HCL 50 MG PO TABS: 50.0000 mg | ORAL_TABLET | Freq: Four times a day (QID) | ORAL | 0 refills | Status: DC | PRN

## 2019-04-15 MED ORDER — TRAMADOL HCL 50 MG PO TABS
50.0000 mg | ORAL_TABLET | Freq: Once | ORAL | Status: AC
  Administered 2019-04-15: 20:00:00 50 mg via ORAL
  Filled 2019-04-15: qty 1

## 2019-04-15 NOTE — ED Triage Notes (Signed)
Pt to ER states she was walking today and lost her balance.  States pain to her left wrist.  No noted deformity.  Pt denies other injury at this time.

## 2019-04-15 NOTE — ED Provider Notes (Signed)
[facility] Emergency Department Provider Note ____________________________________________  Time seen: Approximately 9:39 PM  I have reviewed the triage vital signs and the nursing notes.   HISTORY  Chief Complaint Wrist Pain    HPI [name] is a 73 y.o. female who presents to the emergency department for evaluation and treatment of left wrist injury after mechanical, non-syncopal fall. She tripped and fell forward on outstretched hand. Pain since. No alleviating measures prior to arrival.  Past Medical History:  Diagnosis Date  . Allergy   . Asthma   . Back pain   . Bronchitis   . COPD (chronic obstructive pulmonary disease) ([place])   . Degenerative disc disease, lumbar   . Depression   . FHx: cholecystectomy   . GERD (gastroesophageal reflux disease)   . H/O: hysterectomy   . Headache   . Hypertension   . Murmur   . Osteoporosis   . Pneumonia 03/31/16   being treated by PCP  . Restless legs syndrome   . Scoliosis   . Shingles   . Thyroid disease     Patient Active Problem List   Diagnosis Date Noted  . Iron deficiency anemia 02/01/2018  . Acute exacerbation of chronic obstructive pulmonary disease (COPD) ([place]) 11/05/2016  . Spinal stenosis, lumbar region, with neurogenic claudication 07/08/2015  . Sacroiliac joint dysfunction 06/28/2015  . Greater trochanteric bursitis 04/10/2015  . Sacroiliac joint disease 03/14/2015  . Facet syndrome, lumbar 03/14/2015  . HTN (hypertension) 03/06/2015  . Thyroid disease 03/06/2015  . DDD (degenerative disc disease), cervical 03/04/2015  . DDD (degenerative disc disease), lumbar 03/04/2015  . DJD (degenerative joint disease) of knee 03/04/2015    Past Surgical History:  Procedure Laterality Date  . ABDOMINAL HYSTERECTOMY    . APPENDECTOMY    . BACK SURGERY    . CHOLECYSTECTOMY    . COLONOSCOPY WITH PROPOFOL N/A 01/16/2018   Procedure: COLONOSCOPY WITH PROPOFOL;  Surgeon: Toledo, Benay Pike,  MD;  Location: [facility] ENDOSCOPY;  Service: Gastroenterology;  Laterality: N/A;  . ESOPHAGOGASTRODUODENOSCOPY (EGD) WITH PROPOFOL N/A 01/16/2018   Procedure: ESOPHAGOGASTRODUODENOSCOPY (EGD) WITH PROPOFOL;  Surgeon: Toledo, Benay Pike, MD;  Location: [facility] ENDOSCOPY;  Service: Gastroenterology;  Laterality: N/A;  . JOINT REPLACEMENT Left 1998   shoulder  . NECK SURGERY Bilateral   . ROTATOR CUFF REPAIR Right   . SHOULDER OPEN ROTATOR CUFF REPAIR Right   . SHOULDER SURGERY Left    replacement    Prior to Admission medications   Medication Sig Start Date End Date Taking? Authorizing Provider  albuterol (PROVENTIL HFA;VENTOLIN HFA) 108 (90 Base) MCG/ACT inhaler Inhale 1-2 puffs into the lungs every 6 (six) hours as needed for wheezing or shortness of breath.    [provider]  alendronate (FOSAMAX) 70 MG tablet Take 70 mg by mouth once a week. Take with a full glass of water on an empty stomach. On Sunday    [provider]  amLODipine (NORVASC) 5 MG tablet Take 5 mg by mouth daily.    [provider]  Cholecalciferol (VITAMIN D) 2000 UNITS tablet Take 2,000 Units by mouth daily.    [provider]  citalopram (CELEXA) 40 MG tablet Take 40 mg by mouth daily.     [provider]  gabapentin (NEURONTIN) 300 MG capsule Take 300 mg by mouth 4 (four) times daily.    [provider]  ibuprofen (ADVIL,MOTRIN) 200 MG tablet Take 400-800 mg by mouth every 6 (six) hours as needed for mild  pain or moderate pain.    [provider]  levothyroxine (SYNTHROID, LEVOTHROID) 112 MCG tablet Take 112 mcg by mouth daily before breakfast.    [provider]  metoprolol (LOPRESSOR) 100 MG tablet Take 100 mg by mouth 2 (two) times daily.    [provider]  Multiple Vitamin (MULTIVITAMIN) tablet Take 1 tablet by mouth daily. Reported on 04/05/2016    [provider]  omeprazole (PRILOSEC) 20 MG capsule Take 20 mg by mouth 2 (two) times  daily before a meal.     [provider]  potassium chloride SA (K-DUR,KLOR-CON) 20 MEQ tablet Take 20 mEq by mouth daily.    [provider]  predniSONE (DELTASONE) 20 MG tablet Take 2 tablets (40 mg total) by mouth daily. 01/08/19   Harvest Dark, MD  tiotropium (SPIRIVA HANDIHALER) 18 MCG inhalation capsule Place 1 capsule (18 mcg total) into inhaler and inhale daily. 11/14/17   Laverle Hobby, MD  traMADol (ULTRAM) 50 MG tablet Take 1 tablet (50 mg total) by mouth every 6 (six) hours as needed. 04/15/19   Sherrie George B, FNP    Allergies Adhesive [tape] and Other  Family History  Problem Relation Age of Onset  . Kidney disease Father   . Heart disease Mother   . Diabetes Mother     Social History Social History   Tobacco Use  . Smoking status: Former Smoker    Packs/day: 1.00    Years: 30.00    Pack years: 30.00    Types: Cigarettes    Quit date: 11/04/2016    Years since quitting: 2.4  . Smokeless tobacco: Never Used  Substance Use Topics  . Alcohol use: No    Alcohol/week: 0.0 standard drinks  . Drug use: No    Review of Systems Constitutional: Negative for fever. Cardiovascular: Negative for chest pain. Respiratory: Negative for shortness of breath. Musculoskeletal: positive for left wrist pain. Skin: Negative for open wounds.  Neurological: Negative for decrease in sensation  ____________________________________________   PHYSICAL EXAM:  VITAL SIGNS: ED Triage Vitals  Enc Vitals Group     BP 04/15/19 1940 138/81     Pulse Rate 04/15/19 1940 70     Resp 04/15/19 1940 18     Temp 04/15/19 1940 98.4 F (36.9 C)     Temp Source 04/15/19 1940 Oral     SpO2 04/15/19 1940 96 %     Weight 04/15/19 1941 203 lb (92.1 kg)     Height 04/15/19 1941 4\' 10"  (1.473 m)     Head Circumference --      Peak Flow --      Pain Score 04/15/19 1941 10     Pain Loc --      Pain Edu? --      Excl. in [place]? --     Constitutional: Alert and  oriented. Well appearing and in no acute distress. Eyes: Conjunctivae are clear without discharge or drainage Head: Atraumatic Neck: Supple Respiratory: No cough. Respirations are even and unlabored. Musculoskeletal: Deformity of the left wrist without open wound. FROM of left elbow and shoulder.  Neurologic: Motor and sensory function is intact.  Skin: No open wounds over the left wrist.  Psychiatric: Affect and behavior are appropriate.  ____________________________________________   LABS (all labs ordered are listed, but only abnormal results are displayed)  Labs Reviewed - No data to display ____________________________________________  RADIOLOGY  Cortical irregularity of the distal dorsal and lateral radial neck without displacement.  Minimally  impacted fracture ____________________________________________   PROCEDURES  .Ortho Injury Treatment  Date/Time: 04/15/2019 9:47 PM Performed by: Virginia Rochester, NT Authorized by: Victorino Dike, FNP   Consent:    Consent obtained:  Verbal   Consent given by:  Patient   Risks discussed:  StiffnessInjury location: wrist Location details: left wrist Injury type: fracture Fracture type: distal radius Pre-procedure neurovascular assessment: neurovascularly intact Manipulation performed: no Supplies used: cotton padding,  Ortho-Glass and elastic bandage Post-procedure neurovascular assessment: post-procedure neurovascularly intact Patient tolerance: patient tolerated the procedure well with no immediate complications     ____________________________________________   INITIAL IMPRESSION / ASSESSMENT AND PLAN / ED COURSE  [name] is a 73 y.o. who presents to the emergency department for treatment and evaluation of left wrist pain after mechanical, non-syncopal fall prior to arrival.  X-ray does reveal a minimally impacted distal radius fracture.  She was placed in a sugar tong OCL as described above.  She is to call  and schedule an appointment with Dr.Poggi.  She was advised to return to the emergency department for any symptom of concern if unable to see primary care or orthopedics.  Medications  traMADol (ULTRAM) tablet 50 mg (50 mg Oral Given 04/15/19 2025)    Pertinent labs & imaging results that were available during my care of the patient were reviewed by me and considered in my medical decision making (see chart for details).  _________________________________________   FINAL CLINICAL IMPRESSION(S) / ED DIAGNOSES  Final diagnoses:  Closed fracture of left wrist, initial encounter    ED Discharge Orders         Ordered    traMADol (ULTRAM) 50 MG tablet  Every 6 hours PRN     04/15/19 2105           If controlled substance prescribed during this visit, 12 month history viewed on the [place] prior to issuing an initial prescription for Schedule II or III opiod.   Victorino Dike, FNP 04/15/19 2150    Nena Polio, MD 04/15/19 2228

## 2019-04-15 NOTE — Discharge Instructions (Signed)
Please call and schedule follow-up appointment with Dr. Roland Rack.   Return to the emergency department for increasing severity of pain or other concerns if you are unable to see orthopedics right away.  You may take Tylenol in addition to the pain medication that was submitted to your pharmacy.

## 2019-04-15 NOTE — ED Notes (Signed)
Pt c/o left wrist pain. 2+ pulses present. Pt able to move fingers of affected extremity. Pt in NAD at this time.

## 2019-04-21 ENCOUNTER — Emergency Department: Payer: Medicare Other

## 2019-04-21 ENCOUNTER — Other Ambulatory Visit: Payer: Self-pay

## 2019-04-21 ENCOUNTER — Emergency Department
Admission: EM | Admit: 2019-04-21 | Discharge: 2019-04-21 | Disposition: A | Discharge status: Home / Self Care | Payer: Medicare Other | Attending: Emergency Medicine | Admitting: Emergency Medicine

## 2019-04-21 DIAGNOSIS — Z79899 Other long term (current) drug therapy: Secondary | ICD-10-CM | POA: Diagnosis not present

## 2019-04-21 DIAGNOSIS — J449 Chronic obstructive pulmonary disease, unspecified: Secondary | ICD-10-CM | POA: Insufficient documentation

## 2019-04-21 DIAGNOSIS — Y929 Unspecified place or not applicable: Secondary | ICD-10-CM | POA: Diagnosis not present

## 2019-04-21 DIAGNOSIS — I10 Essential (primary) hypertension: Secondary | ICD-10-CM | POA: Insufficient documentation

## 2019-04-21 DIAGNOSIS — Y999 Unspecified external cause status: Secondary | ICD-10-CM | POA: Insufficient documentation

## 2019-04-21 DIAGNOSIS — Z87891 Personal history of nicotine dependence: Secondary | ICD-10-CM | POA: Insufficient documentation

## 2019-04-21 DIAGNOSIS — W0110XA Fall on same level from slipping, tripping and stumbling with subsequent striking against unspecified object, initial encounter: Secondary | ICD-10-CM | POA: Insufficient documentation

## 2019-04-21 DIAGNOSIS — Y939 Activity, unspecified: Secondary | ICD-10-CM | POA: Diagnosis not present

## 2019-04-21 DIAGNOSIS — S0003XA Contusion of scalp, initial encounter: Secondary | ICD-10-CM | POA: Diagnosis not present

## 2019-04-21 DIAGNOSIS — S0990XA Unspecified injury of head, initial encounter: Secondary | ICD-10-CM | POA: Diagnosis present

## 2019-04-21 MED ORDER — TRAMADOL HCL 50 MG PO TABS
50.0000 mg | ORAL_TABLET | ORAL | Status: AC
  Administered 2019-04-21: 50 mg via ORAL
  Filled 2019-04-21: qty 1

## 2019-04-21 MED ORDER — TRAMADOL HCL 50 MG PO TABS: 50.0000 mg | ORAL_TABLET | Freq: Four times a day (QID) | ORAL | 0 refills | Status: DC | PRN

## 2019-04-21 NOTE — ED Provider Notes (Signed)
[facility] Emergency Department Provider Note   ____________________________________________   First MD Initiated Contact with Patient 04/21/19 1640     (approximate)  I have reviewed the triage vital signs and the nursing notes.   HISTORY  Chief Complaint Head Injury    HPI [name] is a 73 y.o. female reports a history of COPD occasional falls  She reports that she fell and injured her wrist a week ago.  However she also struck her head and she has been having pain over the back of the right lower part of the skull since that time.  Is painful to lay on, painful to touch, slightly swollen.  No fevers or chills.  No headache.  No shortness of breath.  No nausea vomiting.  She has COPD, reports her breathing is normal and she has not had any respiratory symptoms.  She denies neck pain except feels sore just to the base of the neck pointing at the right posterior skull/occipital region  No change in hearing.   Past Medical History:  Diagnosis Date   Allergy    Asthma    Back pain    Bronchitis    COPD (chronic obstructive pulmonary disease) ([facility])    Degenerative disc disease, lumbar    Depression    FHx: cholecystectomy    GERD (gastroesophageal reflux disease)    H/O: hysterectomy    Headache    Hypertension    Murmur    Osteoporosis    Pneumonia 03/31/16   being treated by PCP   Restless legs syndrome    Scoliosis    Shingles    Thyroid disease     Patient Active Problem List   Diagnosis Date Noted   Iron deficiency anemia 02/01/2018   Acute exacerbation of chronic obstructive pulmonary disease (COPD) ([place]) 11/05/2016   Spinal stenosis, lumbar region, with neurogenic claudication 07/08/2015   Sacroiliac joint dysfunction 06/28/2015   Greater trochanteric bursitis 04/10/2015   Sacroiliac joint disease 03/14/2015   Facet syndrome, lumbar 03/14/2015   HTN (hypertension) 03/06/2015   Thyroid  disease 03/06/2015   DDD (degenerative disc disease), cervical 03/04/2015   DDD (degenerative disc disease), lumbar 03/04/2015   DJD (degenerative joint disease) of knee 03/04/2015    Past Surgical History:  Procedure Laterality Date   ABDOMINAL HYSTERECTOMY     APPENDECTOMY     BACK SURGERY     CHOLECYSTECTOMY     COLONOSCOPY WITH PROPOFOL N/A 01/16/2018   Procedure: COLONOSCOPY WITH PROPOFOL;  Surgeon: Toledo, Benay Pike, MD;  Location: [facility] ENDOSCOPY;  Service: Gastroenterology;  Laterality: N/A;   ESOPHAGOGASTRODUODENOSCOPY (EGD) WITH PROPOFOL N/A 01/16/2018   Procedure: ESOPHAGOGASTRODUODENOSCOPY (EGD) WITH PROPOFOL;  Surgeon: Toledo, Benay Pike, MD;  Location: [facility] ENDOSCOPY;  Service: Gastroenterology;  Laterality: N/A;   JOINT REPLACEMENT Left 1998   shoulder   NECK SURGERY Bilateral    ROTATOR CUFF REPAIR Right    SHOULDER OPEN ROTATOR CUFF REPAIR Right    SHOULDER SURGERY Left    replacement    Prior to Admission medications   Medication Sig Start Date End Date Taking? Authorizing Provider  albuterol (PROVENTIL HFA;VENTOLIN HFA) 108 (90 Base) MCG/ACT inhaler Inhale 1-2 puffs into the lungs every 6 (six) hours as needed for wheezing or shortness of breath.    [provider]  alendronate (FOSAMAX) 70 MG tablet Take 70 mg by mouth once a week. Take with a full glass of water on an empty stomach. On Sunday    [provider]  amLODipine (NORVASC) 5 MG tablet Take 5 mg by mouth daily.    [provider]  Cholecalciferol (VITAMIN D) 2000 UNITS tablet Take 2,000 Units by mouth daily.    [provider]  citalopram (CELEXA) 40 MG tablet Take 40 mg by mouth daily.     [provider]  gabapentin (NEURONTIN) 300 MG capsule Take 300 mg by mouth 4 (four) times daily.    [provider]  ibuprofen (ADVIL,MOTRIN) 200 MG tablet Take 400-800 mg by mouth every 6 (six) hours as needed for mild pain or moderate pain.     [provider]  levothyroxine (SYNTHROID, LEVOTHROID) 112 MCG tablet Take 112 mcg by mouth daily before breakfast.    [provider]  metoprolol (LOPRESSOR) 100 MG tablet Take 100 mg by mouth 2 (two) times daily.    [provider]  Multiple Vitamin (MULTIVITAMIN) tablet Take 1 tablet by mouth daily. Reported on 04/05/2016    [provider]  omeprazole (PRILOSEC) 20 MG capsule Take 20 mg by mouth 2 (two) times daily before a meal.     [provider]  potassium chloride SA (K-DUR,KLOR-CON) 20 MEQ tablet Take 20 mEq by mouth daily.    [provider]  predniSONE (DELTASONE) 20 MG tablet Take 2 tablets (40 mg total) by mouth daily. 01/08/19   Harvest Dark, MD  tiotropium (SPIRIVA HANDIHALER) 18 MCG inhalation capsule Place 1 capsule (18 mcg total) into inhaler and inhale daily. 11/14/17   Laverle Hobby, MD  traMADol (ULTRAM) 50 MG tablet Take 1 tablet (50 mg total) by mouth every 6 (six) hours as needed for moderate pain or severe pain. 04/21/19   Delman Kitten, MD    Allergies Adhesive [tape] and Other  Family History  Problem Relation Age of Onset   Kidney disease Father    Heart disease Mother    Diabetes Mother     Social History Social History   Tobacco Use   Smoking status: Former Smoker    Packs/day: 1.00    Years: 30.00    Pack years: 30.00    Types: Cigarettes    Quit date: 11/04/2016    Years since quitting: 2.4   Smokeless tobacco: Never Used  Substance Use Topics   Alcohol use: No    Alcohol/week: 0.0 standard drinks   Drug use: No    Review of Systems Constitutional: No fever/chills Eyes: No visual changes. ENT: No sore throat.  No neck pain except for slight soreness over the back of the skull. Cardiovascular: Denies chest pain. Respiratory: Denies shortness of breath.  Her breathing is normal.  Has COPD Gastrointestinal: No nausea vomiting Musculoskeletal: Negative for back pain. Skin:  Negative for rash. Neurological: Negative for  areas of focal weakness or numbness.    ____________________________________________   PHYSICAL EXAM:  VITAL SIGNS: ED Triage Vitals  Enc Vitals Group     BP 04/21/19 1631 (!) 111/98     Pulse Rate 04/21/19 1631 84     Resp 04/21/19 1631 20     Temp 04/21/19 1631 98.8 F (37.1 C)     Temp Source 04/21/19 1631 Oral     SpO2 04/21/19 1631 96 %     Weight 04/21/19 1632 203 lb (92.1 kg)     Height 04/21/19 1632 4\' 10"  (1.473 m)     Head Circumference --      Peak Flow --      Pain Score 04/21/19 1647 8  Pain Loc --      Pain Edu? --      Excl. in [place]? --     Constitutional: Alert and oriented. Well appearing and in no acute distress.  Seated comfortably in chair. Eyes: Conjunctivae are normal. Head: Atraumatic for a very small amount of swelling at the base of the right occipital region that she reports point tenderness without overlying lesion, warmth or redness. Nose: No congestion/rhinnorhea. Mouth/Throat: Mucous membranes are moist. Neck: No stridor.  Medical thoracic or lumbar tenderness. Cardiovascular: Normal rate, regular rhythm. Grossly normal heart sounds.  Good peripheral circulation. Respiratory: Normal respiratory effort.  No retractions. Lungs CTAB.  Somewhat barrel chested appearance but in no distress. Gastrointestinal: Soft and nontender. No distention. Musculoskeletal: No lower extremity tenderness nor edema.  Moves her extremities well without discomfort or pain except she has the left upper extremity splinted previous injury Neurologic:  Normal speech and language. No gross focal neurologic deficits are appreciated.  Skin:  Skin is warm, dry and intact. No rash noted. Psychiatric: Mood and affect are normal. Speech and behavior are normal.  ____________________________________________   LABS (all labs ordered are listed, but only abnormal results are displayed)  Labs Reviewed - No data to  display ____________________________________________  EKG   ____________________________________________  RADIOLOGY  Ct Head Wo Contrast  Result Date: 04/21/2019 CLINICAL DATA:  Fall, intermittent headache, dizziness EXAM: CT HEAD WITHOUT CONTRAST CT CERVICAL SPINE WITHOUT CONTRAST TECHNIQUE: Multidetector CT imaging of the head and cervical spine was performed following the standard protocol without intravenous contrast. Multiplanar CT image reconstructions of the cervical spine were also generated. COMPARISON:  01/08/2019 FINDINGS: CT HEAD FINDINGS Brain: No evidence of acute infarction, hemorrhage, hydrocephalus, extra-axial collection or mass lesion/mass effect. Subcortical white matter and periventricular small vessel ischemic changes. Global cortical atrophy. Vascular: Mild intracranial atherosclerosis. Skull: Normal. Negative for fracture or focal lesion. Sinuses/Orbits: Chronic wall thickening with opacification of the left maxillary and sphenoid sinuses. Mastoid air cells are clear. Other: None. CT CERVICAL SPINE FINDINGS Alignment: Straightening of the cervical spine. Skull base and vertebrae: No acute fracture. No primary bone lesion or focal pathologic process. Soft tissues and spinal canal: No prevertebral fluid or swelling. No visible canal hematoma. Disc levels: Status post C4-7 ACDF. Mild multilevel degenerative changes of the cervical spine. Spinal canal is patent. Upper chest: Visualized lung apices are clear. Other: Visualized thyroid is unremarkable. IMPRESSION: No evidence of acute intracranial abnormality. Atrophy with small vessel ischemic changes. No evidence of traumatic injury to the cervical spine. Status post C4-7 ACDF, without evidence of complication. Electronically Signed   By: Julian Hy M.D.   On: 04/21/2019 17:31   Ct Cervical Spine Wo Contrast  Result Date: 04/21/2019 CLINICAL DATA:  Fall, intermittent headache, dizziness EXAM: CT HEAD WITHOUT CONTRAST CT  CERVICAL SPINE WITHOUT CONTRAST TECHNIQUE: Multidetector CT imaging of the head and cervical spine was performed following the standard protocol without intravenous contrast. Multiplanar CT image reconstructions of the cervical spine were also generated. COMPARISON:  01/08/2019 FINDINGS: CT HEAD FINDINGS Brain: No evidence of acute infarction, hemorrhage, hydrocephalus, extra-axial collection or mass lesion/mass effect. Subcortical white matter and periventricular small vessel ischemic changes. Global cortical atrophy. Vascular: Mild intracranial atherosclerosis. Skull: Normal. Negative for fracture or focal lesion. Sinuses/Orbits: Chronic wall thickening with opacification of the left maxillary and sphenoid sinuses. Mastoid air cells are clear. Other: None. CT CERVICAL SPINE FINDINGS Alignment: Straightening of the cervical spine. Skull base and vertebrae: No acute fracture. No primary bone lesion  or focal pathologic process. Soft tissues and spinal canal: No prevertebral fluid or swelling. No visible canal hematoma. Disc levels: Status post C4-7 ACDF. Mild multilevel degenerative changes of the cervical spine. Spinal canal is patent. Upper chest: Visualized lung apices are clear. Other: Visualized thyroid is unremarkable. IMPRESSION: No evidence of acute intracranial abnormality. Atrophy with small vessel ischemic changes. No evidence of traumatic injury to the cervical spine. Status post C4-7 ACDF, without evidence of complication. Electronically Signed   By: Julian Hy M.D.   On: 04/21/2019 17:31    CT scans reviewed negative for acute. ____________________________________________   PROCEDURES  Procedure(s) performed: None  Procedures  Critical Care performed: No  ____________________________________________   INITIAL IMPRESSION / ASSESSMENT AND PLAN / ED COURSE  Pertinent labs & imaging results that were available during my care of the patient were reviewed by me and considered in my  medical decision making (see chart for details).   Patient presents for ongoing pain discomfort at the base of the right occiput after a fall a week ago.  Reports that she definitely had pain in her arm from the injury but did not fully notice the pain over the right skull from the fall as well.  She continues to have pain and discomfort slightly swollen appears consistent with likely contusion but given the patient's age ongoing pain CT of the head and neck performed to evaluate for acute injury to the cervical or occipital region.  These are negative for acute.  Patient reports she was treating with tramadol which she has used up, but it did work well when she had it.  We will give her additional 3-day supply of tramadol which she reports is helpful, advised her head CT findings which are reassuring.  See no acute distress, non-toxic.  Patient does not drive.  Return precautions and treatment recommendations and follow-up discussed with the patient who is agreeable with the plan.  Drug database reviewed      ____________________________________________   FINAL CLINICAL IMPRESSION(S) / ED DIAGNOSES  Final diagnoses:  Contusion of occipital region of scalp, initial encounter        Note:  This document was prepared using Dragon voice recognition software and may include unintentional dictation errors       Delman Kitten, MD 04/21/19 1812

## 2019-04-21 NOTE — ED Notes (Signed)
Pt requesting Rx for Tramadol be sent to [facility] on [address]. Per EDP instruction, [facility] in [place] called by nursing staff to cancel current Rx so new one can be written.

## 2019-04-21 NOTE — Discharge Instructions (Signed)
No driving while taking tramadol.

## 2019-04-21 NOTE — ED Notes (Signed)

## 2019-04-21 NOTE — ED Notes (Signed)
Pt noted to be wheezing and SHOB after going to the bathroom - she states this is normal for her and she has COPD Pt reports fall last week and she came to the ED but she was more worried about her left arm hurting than her head - she feels like she hit her head at the time of the fall and has been having a headache on the right side behind her ear

## 2019-04-21 NOTE — ED Triage Notes (Signed)
Pt states she slipped on the ramp yesterday and may have hit her head, pt c/o intermittent HA with dizziness since the fall last Wednesday. Pt states she was seen here for the fall.

## 2019-04-25 ENCOUNTER — Emergency Department
Admission: EM | Admit: 2019-04-25 | Discharge: 2019-04-25 | Disposition: A | Discharge status: Home / Self Care | Payer: Medicare Other | Source: Home / Self Care | Attending: Emergency Medicine | Admitting: Emergency Medicine

## 2019-04-25 ENCOUNTER — Emergency Department: Payer: Medicare Other

## 2019-04-25 ENCOUNTER — Encounter: Payer: Self-pay | Admitting: Emergency Medicine

## 2019-04-25 ENCOUNTER — Other Ambulatory Visit: Payer: Self-pay

## 2019-04-25 DIAGNOSIS — Y998 Other external cause status: Secondary | ICD-10-CM | POA: Insufficient documentation

## 2019-04-25 DIAGNOSIS — S0990XA Unspecified injury of head, initial encounter: Secondary | ICD-10-CM

## 2019-04-25 DIAGNOSIS — W19XXXA Unspecified fall, initial encounter: Secondary | ICD-10-CM | POA: Insufficient documentation

## 2019-04-25 DIAGNOSIS — Y9389 Activity, other specified: Secondary | ICD-10-CM | POA: Insufficient documentation

## 2019-04-25 DIAGNOSIS — S161XXA Strain of muscle, fascia and tendon at neck level, initial encounter: Secondary | ICD-10-CM | POA: Insufficient documentation

## 2019-04-25 DIAGNOSIS — Z79899 Other long term (current) drug therapy: Secondary | ICD-10-CM | POA: Insufficient documentation

## 2019-04-25 DIAGNOSIS — Z87891 Personal history of nicotine dependence: Secondary | ICD-10-CM | POA: Insufficient documentation

## 2019-04-25 DIAGNOSIS — R5383 Other fatigue: Secondary | ICD-10-CM | POA: Insufficient documentation

## 2019-04-25 DIAGNOSIS — I10 Essential (primary) hypertension: Secondary | ICD-10-CM | POA: Insufficient documentation

## 2019-04-25 DIAGNOSIS — J449 Chronic obstructive pulmonary disease, unspecified: Secondary | ICD-10-CM | POA: Insufficient documentation

## 2019-04-25 DIAGNOSIS — E079 Disorder of thyroid, unspecified: Secondary | ICD-10-CM | POA: Insufficient documentation

## 2019-04-25 DIAGNOSIS — R443 Hallucinations, unspecified: Secondary | ICD-10-CM | POA: Diagnosis not present

## 2019-04-25 DIAGNOSIS — Z96612 Presence of left artificial shoulder joint: Secondary | ICD-10-CM | POA: Insufficient documentation

## 2019-04-25 DIAGNOSIS — Y9289 Other specified places as the place of occurrence of the external cause: Secondary | ICD-10-CM | POA: Insufficient documentation

## 2019-04-25 DIAGNOSIS — E222 Syndrome of inappropriate secretion of antidiuretic hormone: Secondary | ICD-10-CM | POA: Diagnosis not present

## 2019-04-25 LAB — COMPREHENSIVE METABOLIC PANEL
ALT: 15 U/L (ref 0–44)
AST: 22 U/L (ref 15–41)
Albumin: 3.8 g/dL (ref 3.5–5.0)
Alkaline Phosphatase: 66 U/L (ref 38–126)
Anion gap: 8 (ref 5–15)
BUN: 15 mg/dL (ref 8–23)
CO2: 26 mmol/L (ref 22–32)
Calcium: 9.2 mg/dL (ref 8.9–10.3)
Chloride: 92 mmol/L — ABNORMAL LOW (ref 98–111)
Creatinine, Ser: 0.69 mg/dL (ref 0.44–1.00)
GFR calc Af Amer: >60 mL/min (ref >60)
GFR calc non Af Amer: >60 mL/min (ref >60)
Glucose, Bld: 105 mg/dL — ABNORMAL HIGH (ref 70–99)
Potassium: 5.1 mmol/L (ref 3.5–5.1)
Sodium: 126 mmol/L — ABNORMAL LOW (ref 135–145)
Total Bilirubin: 0.5 mg/dL (ref 0.3–1.2)
Total Protein: 7.2 g/dL (ref 6.5–8.1)

## 2019-04-25 LAB — CBC
HCT: 39.3 % (ref 36.0–46.0)
Hemoglobin: 12.6 g/dL (ref 12.0–15.0)
MCH: 27.8 pg (ref 26.0–34.0)
MCHC: 32.1 g/dL (ref 30.0–36.0)
MCV: 86.6 fL (ref 80.0–100.0)
Platelets: 305 10*3/uL (ref 150–400)
RBC: 4.54 MIL/uL (ref 3.87–5.11)
RDW: 14.4 % (ref 11.5–15.5)
WBC: 10.9 10*3/uL — ABNORMAL HIGH (ref 4.0–10.5)
nRBC: 0 % (ref 0.0–0.2)

## 2019-04-25 LAB — TSH: TSH: 1.875 u[IU]/mL (ref 0.350–4.500)

## 2019-04-25 MED ORDER — ACETAMINOPHEN 325 MG PO TABS
650.0000 mg | ORAL_TABLET | Freq: Once | ORAL | Status: AC
  Administered 2019-04-25: 13:00:00 650 mg via ORAL
  Filled 2019-04-25: qty 2

## 2019-04-25 MED ORDER — TRAMADOL HCL 50 MG PO TABS
50.0000 mg | ORAL_TABLET | ORAL | Status: AC
  Administered 2019-04-25: 50 mg via ORAL
  Filled 2019-04-25: qty 1

## 2019-04-25 MED ORDER — SODIUM CHLORIDE 0.9 % IV BOLUS
500.0000 mL | Freq: Once | INTRAVENOUS | Status: AC
  Administered 2019-04-25: 500 mL via INTRAVENOUS

## 2019-04-25 NOTE — ED Notes (Signed)
Pt discharged home after verbalizing understanding of discharge instructions; nad noted. 

## 2019-04-25 NOTE — ED Notes (Signed)
ED tech Sarah and RN Mickel Baas attempted blood draw from patient, were unable to obtain blood. Lab called to come draw blood from patient

## 2019-04-25 NOTE — ED Provider Notes (Signed)
[facility] Emergency Department Provider Note   ____________________________________________   First MD Initiated Contact with Patient 04/25/19 1202     (approximate)  I have reviewed the triage vital signs and the nursing notes.   HISTORY  Chief Complaint Fall    HPI [name] is a 73 y.o. female here for evaluation after falling and striking her head  Patient reports that on Wednesday she was getting up, felt off balance fell forward struck her head on the forehead also having some neck pain and discomfort.  She has had multiple falls recently including 1 just a couple days ago and then a few days before that leading to wrist injury.  Additionally, she has reported that she is falling almost daily now and feels like her balance is just off.  She feels a bit fatigued.  Taking tramadol for pain but reports it does not provide much relief.  No numbness or tingling.  No weakness in arm or leg.  No trouble speaking.    No chest pain.  No fevers or chills.  No exposure to anyone with coronavirus.  She does have a history of falls in the past, but is been falling a lot frequently and feels like her balance is consistently been off especially when she tries to get up and walk in the evenings she does use a walker at baseline   Past Medical History:  Diagnosis Date   Allergy    Asthma    Back pain    Bronchitis    COPD (chronic obstructive pulmonary disease) (HCC)    Degenerative disc disease, lumbar    Depression    FHx: cholecystectomy    GERD (gastroesophageal reflux disease)    H/O: hysterectomy    Headache    Hypertension    Murmur    Osteoporosis    Pneumonia 03/31/16   being treated by PCP   Restless legs syndrome    Scoliosis    Shingles    Thyroid disease     Patient Active Problem List   Diagnosis Date Noted   Iron deficiency anemia 02/01/2018   Acute exacerbation of chronic obstructive pulmonary disease  (COPD) ([place]) 11/05/2016   Spinal stenosis, lumbar region, with neurogenic claudication 07/08/2015   Sacroiliac joint dysfunction 06/28/2015   Greater trochanteric bursitis 04/10/2015   Sacroiliac joint disease 03/14/2015   Facet syndrome, lumbar 03/14/2015   HTN (hypertension) 03/06/2015   Thyroid disease 03/06/2015   DDD (degenerative disc disease), cervical 03/04/2015   DDD (degenerative disc disease), lumbar 03/04/2015   DJD (degenerative joint disease) of knee 03/04/2015    Past Surgical History:  Procedure Laterality Date   ABDOMINAL HYSTERECTOMY     APPENDECTOMY     BACK SURGERY     CHOLECYSTECTOMY     COLONOSCOPY WITH PROPOFOL N/A 01/16/2018   Procedure: COLONOSCOPY WITH PROPOFOL;  Surgeon: Toledo, Benay Pike, MD;  Location: [facility] ENDOSCOPY;  Service: Gastroenterology;  Laterality: N/A;   ESOPHAGOGASTRODUODENOSCOPY (EGD) WITH PROPOFOL N/A 01/16/2018   Procedure: ESOPHAGOGASTRODUODENOSCOPY (EGD) WITH PROPOFOL;  Surgeon: Toledo, Benay Pike, MD;  Location: [facility] ENDOSCOPY;  Service: Gastroenterology;  Laterality: N/A;   JOINT REPLACEMENT Left 1998   shoulder   NECK SURGERY Bilateral    ROTATOR CUFF REPAIR Right    SHOULDER OPEN ROTATOR CUFF REPAIR Right    SHOULDER SURGERY Left    replacement    Prior to Admission medications   Medication Sig Start Date End Date Taking? Authorizing Provider  albuterol (PROVENTIL HFA;VENTOLIN  HFA) 108 (90 Base) MCG/ACT inhaler Inhale 1-2 puffs into the lungs every 6 (six) hours as needed for wheezing or shortness of breath.    [provider]  alendronate (FOSAMAX) 70 MG tablet Take 70 mg by mouth once a week. Take with a full glass of water on an empty stomach. On Sunday    [provider]  amLODipine (NORVASC) 5 MG tablet Take 5 mg by mouth daily.    [provider]  Cholecalciferol (VITAMIN D) 2000 UNITS tablet Take 2,000 Units by mouth daily.    [provider]  citalopram (CELEXA) 40 MG  tablet Take 40 mg by mouth daily.     [provider]  gabapentin (NEURONTIN) 300 MG capsule Take 300 mg by mouth 4 (four) times daily.    [provider]  ibuprofen (ADVIL,MOTRIN) 200 MG tablet Take 400-800 mg by mouth every 6 (six) hours as needed for mild pain or moderate pain.    [provider]  levothyroxine (SYNTHROID, LEVOTHROID) 112 MCG tablet Take 112 mcg by mouth daily before breakfast.    [provider]  metoprolol (LOPRESSOR) 100 MG tablet Take 100 mg by mouth 2 (two) times daily.    [provider]  Multiple Vitamin (MULTIVITAMIN) tablet Take 1 tablet by mouth daily. Reported on 04/05/2016    [provider]  omeprazole (PRILOSEC) 20 MG capsule Take 20 mg by mouth 2 (two) times daily before a meal.     [provider]  potassium chloride SA (K-DUR,KLOR-CON) 20 MEQ tablet Take 20 mEq by mouth daily.    [provider]  predniSONE (DELTASONE) 20 MG tablet Take 2 tablets (40 mg total) by mouth daily. 01/08/19   Harvest Dark, MD  tiotropium (SPIRIVA HANDIHALER) 18 MCG inhalation capsule Place 1 capsule (18 mcg total) into inhaler and inhale daily. 11/14/17   Laverle Hobby, MD  traMADol (ULTRAM) 50 MG tablet Take 1 tablet (50 mg total) by mouth every 6 (six) hours as needed for moderate pain or severe pain. 04/21/19   Delman Kitten, MD    Allergies Adhesive [tape] and Other  Family History  Problem Relation Age of Onset   Kidney disease Father    Heart disease Mother    Diabetes Mother     Social History Social History   Tobacco Use   Smoking status: Former Smoker    Packs/day: 1.00    Years: 30.00    Pack years: 30.00    Types: Cigarettes    Quit date: 11/04/2016    Years since quitting: 2.4   Smokeless tobacco: Never Used  Substance Use Topics   Alcohol use: No    Alcohol/week: 0.0 standard drinks   Drug use: No    Review of Systems Constitutional: No fever/chills Eyes: No visual  changes. ENT: No sore throat.  Sub-pain over the left side of her neck when she turned left and right for the last 2 days Cardiovascular: Denies chest pain. Respiratory: Denies shortness of breath. Gastrointestinal: No abdominal pain.   Genitourinary: Negative for dysuria. Musculoskeletal: Negative for back pain. Skin: Negative for rash. Neurological: Negative for areas of focal weakness or numbness.    ____________________________________________   PHYSICAL EXAM:  VITAL SIGNS: ED Triage Vitals  Enc Vitals Group     BP 04/25/19 1034 (!) 154/99     Pulse Rate 04/25/19 1034 72     Resp 04/25/19 1034 16     Temp 04/25/19 1034 98.8 F (37.1 C)  Temp Source 04/25/19 1034 Oral     SpO2 04/25/19 1034 100 %     Weight 04/25/19 1035 203 lb (92.1 kg)     Height 04/25/19 1035 4\' 10"  (1.473 m)     Head Circumference --      Peak Flow --      Pain Score 04/25/19 1035 8     Pain Loc --      Pain Edu? --      Excl. in [place]? --     Constitutional: Alert and oriented. Well appearing and in no acute distress.  She is pleasant. Eyes: Conjunctivae are normal. Head: Atraumatic trip she does report it feels sore to palpation across the forehead but there is no bruising edema or hematoma. Nose: No congestion/rhinnorhea. Mouth/Throat: Mucous membranes are somewhat dry. Neck: No stridor.  Mild to moderate tenderness of the left paraspinous region.  Tenderness over the right mastoid region is no longer present Cardiovascular: Normal rate, regular rhythm. Grossly normal heart sounds.  Good peripheral circulation. Respiratory: Normal respiratory effort.  No retractions. Lungs CTAB. Gastrointestinal: Soft and nontender. No distention. Musculoskeletal: No lower extremity tenderness nor edema. Neurologic:  Normal speech and language. No gross focal neurologic deficits are appreciated.  Skin:  Skin is warm, dry and intact. No rash noted. Psychiatric: Mood and affect are normal. Speech and behavior  are normal.  ____________________________________________   LABS (all labs ordered are listed, but only abnormal results are displayed)  Labs Reviewed  CBC - Abnormal; Notable for the following components:      Result Value   WBC 10.9 (*)    All other components within normal limits  COMPREHENSIVE METABOLIC PANEL - Abnormal; Notable for the following components:   Sodium 126 (*)    Chloride 92 (*)    Glucose, Bld 105 (*)    All other components within normal limits  TSH  URINALYSIS, COMPLETE (UACMP) WITH MICROSCOPIC   ____________________________________________  EKG  Reviewed entered by me at 11 AM Heart rate 70 QRS 100 QTc 400 Normal sinus rhythm, possible left ventricular hypertrophy.  No evidence acute ischemia ____________________________________________  RADIOLOGY  Ct Head Wo Contrast  Result Date: 04/25/2019 CLINICAL DATA:  [place] on Wednesday striking head, multiple recent falls, imbalance, headache since fall, denies loss of consciousness EXAM: CT HEAD WITHOUT CONTRAST CT CERVICAL SPINE WITHOUT CONTRAST TECHNIQUE: Multidetector CT imaging of the head and cervical spine was performed following the standard protocol without intravenous contrast. Multiplanar CT image reconstructions of the cervical spine were also generated. Motion artifacts on CT head despite repeating images. COMPARISON:  04/21/2019 FINDINGS: CT HEAD FINDINGS Brain: Generalized atrophy. Normal ventricular morphology. No midline shift or mass effect. Small vessel chronic ischemic changes of deep cerebral white matter. No intracranial hemorrhage, mass lesion, evidence of acute infarction, or extra-axial fluid collection. Vascular: No hyperdense vessels. Mild atherosclerotic calcifications of internal carotid arteries at skull base Skull: Grossly intact Sinuses/Orbits: Chronic opacification and osseous thickening of LEFT maxillary sinus. Remaining sinuses and mastoid air cells clear. Other: N/A CT CERVICAL SPINE  FINDINGS Alignment: Mild anterolisthesis C3-C4 unchanged. Remaining alignments normal. Skull base and vertebrae: Osseous demineralization. Multilevel facet degenerative changes. Prior anterior fusion of C4-C7. Disc space narrowing C3-C4, C7-T1. No acute fracture or bone destruction. Soft tissues and spinal canal: Fullness of prevertebral soft tissues anterior to the surgical plate, similar to prior study. Atherosclerotic calcifications in the carotid systems and at aortic arch. Disc levels: Portions of disc spaces and spinal canal are obscured by beam  hardening artifacts from fixation hardware. Upper chest: Lung apices clear Other: N/A IMPRESSION: Atrophy with small vessel chronic ischemic changes of deep cerebral white matter. No acute cervical spine abnormalities. Scattered degenerative disc and facet disease changes of the cervical spine with postsurgical changes from prior C4-C7 ACDF. No acute cervical spine abnormalities. Electronically Signed   By: Lavonia Dana M.D.   On: 04/25/2019 13:02   Ct Cervical Spine Wo Contrast  Result Date: 04/25/2019 CLINICAL DATA:  [place] on Wednesday striking head, multiple recent falls, imbalance, headache since fall, denies loss of consciousness EXAM: CT HEAD WITHOUT CONTRAST CT CERVICAL SPINE WITHOUT CONTRAST TECHNIQUE: Multidetector CT imaging of the head and cervical spine was performed following the standard protocol without intravenous contrast. Multiplanar CT image reconstructions of the cervical spine were also generated. Motion artifacts on CT head despite repeating images. COMPARISON:  04/21/2019 FINDINGS: CT HEAD FINDINGS Brain: Generalized atrophy. Normal ventricular morphology. No midline shift or mass effect. Small vessel chronic ischemic changes of deep cerebral white matter. No intracranial hemorrhage, mass lesion, evidence of acute infarction, or extra-axial fluid collection. Vascular: No hyperdense vessels. Mild atherosclerotic calcifications of internal  carotid arteries at skull base Skull: Grossly intact Sinuses/Orbits: Chronic opacification and osseous thickening of LEFT maxillary sinus. Remaining sinuses and mastoid air cells clear. Other: N/A CT CERVICAL SPINE FINDINGS Alignment: Mild anterolisthesis C3-C4 unchanged. Remaining alignments normal. Skull base and vertebrae: Osseous demineralization. Multilevel facet degenerative changes. Prior anterior fusion of C4-C7. Disc space narrowing C3-C4, C7-T1. No acute fracture or bone destruction. Soft tissues and spinal canal: Fullness of prevertebral soft tissues anterior to the surgical plate, similar to prior study. Atherosclerotic calcifications in the carotid systems and at aortic arch. Disc levels: Portions of disc spaces and spinal canal are obscured by beam hardening artifacts from fixation hardware. Upper chest: Lung apices clear Other: N/A IMPRESSION: Atrophy with small vessel chronic ischemic changes of deep cerebral white matter. No acute cervical spine abnormalities. Scattered degenerative disc and facet disease changes of the cervical spine with postsurgical changes from prior C4-C7 ACDF. No acute cervical spine abnormalities. Electronically Signed   By: Lavonia Dana M.D.   On: 04/25/2019 13:02    CT imaging negative for acute traumatic injuries ____________________________________________   PROCEDURES  Procedure(s) performed: None  Procedures  Critical Care performed: No  ____________________________________________   INITIAL IMPRESSION / ASSESSMENT AND PLAN / ED COURSE  Pertinent labs & imaging results that were available during my care of the patient were reviewed by me and considered in my medical decision making (see chart for details).   Patient presents for fall.  She reports falling forward striking her forehead without loss consciousness.  I ordered CT of the head and neck given the patient's age and report of recurrent falls, she did have a recent head CT and actually saw  her in the emergency room recently for similar falls but she reports that now she is just continue to fall frequently increased frequency and she feels a little off balance as well thus prompting me to evaluate and check for lab abnormalities.  Of note her sodium is 126, this appears to be a new finding.  We will add TSH and I suspect low sodium may also be contributing to her cause for falling.  On examination she overall appears euvolemic to me.    Clinical Course as of Apr 24 1508  Fri Apr 25, 2019  1244 Na 126 noted.  To have a history of recent mild hyponatremia on past  checks, will hydrate gently.  Doubtful that hyponatremia will be leading to her frequent falls over last several weeks.  Await CT scan.   [MQ]  1303 New able to order home physical therapy to the patient's recent wrist injury, gait difficulty.  Patient agreeable with this plan and has had physical therapy before, this may be helpful in also strengthening her and avoiding future falls.   [MQ]    Clinical Course User Index [MQ] Delman Kitten, MD   Patient resting.  I have paged primary doctor, Dr. Edwina Barth to discuss sodium and patient's clinical history. Dr. Edwina Barth did not call back, but I did send [facility] and [facility] inbox messages requesting close follow-up and consideration for repeat sodium testing as outpatient.  ----------------------------------------- 3:09 PM on 04/25/2019 -----------------------------------------  Orthostatics reviewed.  Patient resting comfortably.  Reports he is comfortable with plan for discharge, has been hydrated with small dose of IV fluid for suspected possible slight hypovolemia.  Alert and oriented reassuring vital signs nontoxic appropriate for discharge with close outpatient follow-up requested through PCP  ____________________________________________   FINAL CLINICAL IMPRESSION(S) / ED DIAGNOSES  Final diagnoses:  Fall, initial encounter  Injury of head, initial encounter  Acute strain  of neck muscle, initial encounter        Note:  This document was prepared using Set designer software and may include unintentional dictation errors       Delman Kitten, MD 04/25/19 [phone]

## 2019-04-25 NOTE — ED Triage Notes (Signed)
Patient states she fell Wednesday and hit her head. States she has had recent falls caused by her "feet getting caught up and losing balance". Patient states since this fall, she has had a headache. Denies use of blood thinners.

## 2019-04-25 NOTE — TOC Initial Note (Addendum)
Transition of Care [facility]) - Initial/Assessment Note    Patient Details  Name: [name] MRN: [id number] Date of Birth: 11/16/1945  [facility] of [facility]) CM/SW Contact:    Marshell Garfinkel, RN Phone Number: 04/25/2019, 12:59 PM  Clinical Narrative:                 ED RNCM met with patient to offer choice of home health care. She states she would like to have PT come to her home. She has used [place] home health in the past after shoulder surgery with Dr. Mauri Pole. I explained that [facility] now [facility]- number provided. Helene Kelp with [facility] has accepted patient with update that it may be up to 72 hours before they can start care. Patient lives with adult children and depends on her husband for transportation. Her PCP is Dr. Harrel Lemon.  She denies problems paying for medications. She ambulates with front-wheeled walker at baseline.   Expected Discharge Plan: [place]     Patient Goals and CMS Choice Patient states their goals for this hospitalization and ongoing recovery are:: "I plan to return to home" CMS Medicare.gov Compare Post Acute Care list provided to:: Patient Choice offered to / list presented to : Patient  Expected Discharge Plan and Services Expected Discharge Plan: [place]   Discharge Planning Services: CM Consult Post Acute Care Choice: Home Health                             HH Arranged: PT, OT [facility] Agency: [facility] (now [facility]) Date [place]: 04/25/19 Time [place]: [place] Representative spoke with at [place]: Drue Novel  Prior Living Arrangements/Services   Lives with:: Adult Children, Spouse   Do you feel safe going back to the place where you live?: Yes          Current home services: DME    Activities of Daily Living      Permission Sought/Granted                  Emotional Assessment              Admission diagnosis:  fall Patient  Active Problem List   Diagnosis Date Noted  . Iron deficiency anemia 02/01/2018  . Acute exacerbation of chronic obstructive pulmonary disease (COPD) ([place]) 11/05/2016  . Spinal stenosis, lumbar region, with neurogenic claudication 07/08/2015  . Sacroiliac joint dysfunction 06/28/2015  . Greater trochanteric bursitis 04/10/2015  . Sacroiliac joint disease 03/14/2015  . Facet syndrome, lumbar 03/14/2015  . HTN (hypertension) 03/06/2015  . Thyroid disease 03/06/2015  . DDD (degenerative disc disease), cervical 03/04/2015  . DDD (degenerative disc disease), lumbar 03/04/2015  . DJD (degenerative joint disease) of knee 03/04/2015   PCP:  Baxter Hire, MD Pharmacy:   [facility] [place], [place] [facility] [address] AT [place] [facility] [place] [address] Phone: [phone] Fax: [place] [address]), [place]) [place] [address] Phone: [phone] Fax: [phone]     Social Determinants of Health (SDOH) Interventions    Readmission Risk Interventions No flowsheet data found.

## 2019-04-26 ENCOUNTER — Other Ambulatory Visit: Payer: Self-pay

## 2019-04-26 ENCOUNTER — Encounter: Payer: Self-pay | Admitting: Emergency Medicine

## 2019-04-26 DIAGNOSIS — F329 Major depressive disorder, single episode, unspecified: Secondary | ICD-10-CM | POA: Diagnosis present

## 2019-04-26 DIAGNOSIS — Z889 Allergy status to unspecified drugs, medicaments and biological substances status: Secondary | ICD-10-CM

## 2019-04-26 DIAGNOSIS — Z90710 Acquired absence of both cervix and uterus: Secondary | ICD-10-CM

## 2019-04-26 DIAGNOSIS — R441 Visual hallucinations: Secondary | ICD-10-CM | POA: Diagnosis present

## 2019-04-26 DIAGNOSIS — Z833 Family history of diabetes mellitus: Secondary | ICD-10-CM

## 2019-04-26 DIAGNOSIS — Z6841 Body Mass Index (BMI) 40.0 and over, adult: Secondary | ICD-10-CM

## 2019-04-26 DIAGNOSIS — J449 Chronic obstructive pulmonary disease, unspecified: Secondary | ICD-10-CM | POA: Diagnosis present

## 2019-04-26 DIAGNOSIS — I10 Essential (primary) hypertension: Secondary | ICD-10-CM | POA: Diagnosis present

## 2019-04-26 DIAGNOSIS — Z87891 Personal history of nicotine dependence: Secondary | ICD-10-CM

## 2019-04-26 DIAGNOSIS — G2581 Restless legs syndrome: Secondary | ICD-10-CM | POA: Diagnosis present

## 2019-04-26 DIAGNOSIS — E860 Dehydration: Secondary | ICD-10-CM | POA: Diagnosis present

## 2019-04-26 DIAGNOSIS — Z8249 Family history of ischemic heart disease and other diseases of the circulatory system: Secondary | ICD-10-CM

## 2019-04-26 DIAGNOSIS — Z791 Long term (current) use of non-steroidal anti-inflammatories (NSAID): Secondary | ICD-10-CM

## 2019-04-26 DIAGNOSIS — W1800XA Striking against unspecified object with subsequent fall, initial encounter: Secondary | ICD-10-CM | POA: Diagnosis present

## 2019-04-26 DIAGNOSIS — Z9049 Acquired absence of other specified parts of digestive tract: Secondary | ICD-10-CM

## 2019-04-26 DIAGNOSIS — M419 Scoliosis, unspecified: Secondary | ICD-10-CM | POA: Diagnosis present

## 2019-04-26 DIAGNOSIS — S161XXA Strain of muscle, fascia and tendon at neck level, initial encounter: Secondary | ICD-10-CM | POA: Diagnosis present

## 2019-04-26 DIAGNOSIS — Z79891 Long term (current) use of opiate analgesic: Secondary | ICD-10-CM

## 2019-04-26 DIAGNOSIS — Z79890 Hormone replacement therapy (postmenopausal): Secondary | ICD-10-CM

## 2019-04-26 DIAGNOSIS — Z96612 Presence of left artificial shoulder joint: Secondary | ICD-10-CM | POA: Diagnosis present

## 2019-04-26 DIAGNOSIS — R296 Repeated falls: Secondary | ICD-10-CM | POA: Diagnosis present

## 2019-04-26 DIAGNOSIS — E222 Syndrome of inappropriate secretion of antidiuretic hormone: Principal | ICD-10-CM | POA: Diagnosis present

## 2019-04-26 DIAGNOSIS — T39395A Adverse effect of other nonsteroidal anti-inflammatory drugs [NSAID], initial encounter: Secondary | ICD-10-CM | POA: Diagnosis present

## 2019-04-26 DIAGNOSIS — M810 Age-related osteoporosis without current pathological fracture: Secondary | ICD-10-CM | POA: Diagnosis present

## 2019-04-26 DIAGNOSIS — T43225A Adverse effect of selective serotonin reuptake inhibitors, initial encounter: Secondary | ICD-10-CM | POA: Diagnosis present

## 2019-04-26 DIAGNOSIS — Z1159 Encounter for screening for other viral diseases: Secondary | ICD-10-CM

## 2019-04-26 DIAGNOSIS — Z79899 Other long term (current) drug therapy: Secondary | ICD-10-CM

## 2019-04-26 DIAGNOSIS — E039 Hypothyroidism, unspecified: Secondary | ICD-10-CM | POA: Diagnosis present

## 2019-04-26 DIAGNOSIS — G9341 Metabolic encephalopathy: Secondary | ICD-10-CM | POA: Diagnosis present

## 2019-04-26 DIAGNOSIS — X58XXXA Exposure to other specified factors, initial encounter: Secondary | ICD-10-CM | POA: Diagnosis present

## 2019-04-26 DIAGNOSIS — Z7983 Long term (current) use of bisphosphonates: Secondary | ICD-10-CM

## 2019-04-26 DIAGNOSIS — Z841 Family history of disorders of kidney and ureter: Secondary | ICD-10-CM

## 2019-04-26 DIAGNOSIS — K219 Gastro-esophageal reflux disease without esophagitis: Secondary | ICD-10-CM | POA: Diagnosis present

## 2019-04-26 NOTE — ED Triage Notes (Signed)
Pt reports headache since she fell on 6/16; also hit her head at that time; was back here on 6/22 and 6/27 for the headache; CT was negative; pt says her headache feels slightly better; pt says her family brought her in tonight because she is having visual hallucinations and "talking out of her head"; when I asked pt if she remembers doing that "she says I guess I am. At least that's what they're telling me"; pt talking in complete coherent sentences; alert and oriented x 3;

## 2019-04-27 ENCOUNTER — Emergency Department: Payer: Medicare Other

## 2019-04-27 ENCOUNTER — Inpatient Hospital Stay
Admission: EM | Admit: 2019-04-27 | Discharge: 2019-05-05 | DRG: 643 | Disposition: A | Discharge status: Home Health | Payer: Medicare Other | Attending: Internal Medicine | Admitting: Internal Medicine

## 2019-04-27 ENCOUNTER — Other Ambulatory Visit: Payer: Self-pay

## 2019-04-27 DIAGNOSIS — Z889 Allergy status to unspecified drugs, medicaments and biological substances status: Secondary | ICD-10-CM | POA: Diagnosis not present

## 2019-04-27 DIAGNOSIS — Z1159 Encounter for screening for other viral diseases: Secondary | ICD-10-CM | POA: Diagnosis not present

## 2019-04-27 DIAGNOSIS — Z87891 Personal history of nicotine dependence: Secondary | ICD-10-CM | POA: Diagnosis not present

## 2019-04-27 DIAGNOSIS — R296 Repeated falls: Secondary | ICD-10-CM | POA: Diagnosis present

## 2019-04-27 DIAGNOSIS — E039 Hypothyroidism, unspecified: Secondary | ICD-10-CM | POA: Diagnosis present

## 2019-04-27 DIAGNOSIS — E860 Dehydration: Secondary | ICD-10-CM | POA: Diagnosis present

## 2019-04-27 DIAGNOSIS — Z6841 Body Mass Index (BMI) 40.0 and over, adult: Secondary | ICD-10-CM | POA: Diagnosis not present

## 2019-04-27 DIAGNOSIS — E871 Hypo-osmolality and hyponatremia: Secondary | ICD-10-CM | POA: Diagnosis not present

## 2019-04-27 DIAGNOSIS — R441 Visual hallucinations: Secondary | ICD-10-CM | POA: Diagnosis present

## 2019-04-27 DIAGNOSIS — G9341 Metabolic encephalopathy: Secondary | ICD-10-CM | POA: Diagnosis present

## 2019-04-27 DIAGNOSIS — I10 Essential (primary) hypertension: Secondary | ICD-10-CM | POA: Diagnosis present

## 2019-04-27 DIAGNOSIS — G2581 Restless legs syndrome: Secondary | ICD-10-CM | POA: Diagnosis present

## 2019-04-27 DIAGNOSIS — Z833 Family history of diabetes mellitus: Secondary | ICD-10-CM | POA: Diagnosis not present

## 2019-04-27 DIAGNOSIS — Z90710 Acquired absence of both cervix and uterus: Secondary | ICD-10-CM | POA: Diagnosis not present

## 2019-04-27 DIAGNOSIS — E222 Syndrome of inappropriate secretion of antidiuretic hormone: Secondary | ICD-10-CM | POA: Diagnosis present

## 2019-04-27 DIAGNOSIS — M810 Age-related osteoporosis without current pathological fracture: Secondary | ICD-10-CM | POA: Diagnosis present

## 2019-04-27 DIAGNOSIS — K219 Gastro-esophageal reflux disease without esophagitis: Secondary | ICD-10-CM | POA: Diagnosis present

## 2019-04-27 DIAGNOSIS — R531 Weakness: Secondary | ICD-10-CM

## 2019-04-27 DIAGNOSIS — Z9049 Acquired absence of other specified parts of digestive tract: Secondary | ICD-10-CM | POA: Diagnosis not present

## 2019-04-27 DIAGNOSIS — W1800XA Striking against unspecified object with subsequent fall, initial encounter: Secondary | ICD-10-CM | POA: Diagnosis present

## 2019-04-27 DIAGNOSIS — X58XXXA Exposure to other specified factors, initial encounter: Secondary | ICD-10-CM | POA: Diagnosis present

## 2019-04-27 DIAGNOSIS — M419 Scoliosis, unspecified: Secondary | ICD-10-CM | POA: Diagnosis present

## 2019-04-27 DIAGNOSIS — J449 Chronic obstructive pulmonary disease, unspecified: Secondary | ICD-10-CM | POA: Diagnosis present

## 2019-04-27 DIAGNOSIS — R443 Hallucinations, unspecified: Secondary | ICD-10-CM

## 2019-04-27 DIAGNOSIS — Z8249 Family history of ischemic heart disease and other diseases of the circulatory system: Secondary | ICD-10-CM | POA: Diagnosis not present

## 2019-04-27 DIAGNOSIS — Z841 Family history of disorders of kidney and ureter: Secondary | ICD-10-CM | POA: Diagnosis not present

## 2019-04-27 DIAGNOSIS — Z96612 Presence of left artificial shoulder joint: Secondary | ICD-10-CM | POA: Diagnosis present

## 2019-04-27 DIAGNOSIS — F329 Major depressive disorder, single episode, unspecified: Secondary | ICD-10-CM | POA: Diagnosis present

## 2019-04-27 LAB — CBC
HCT: 36.3 % (ref 36.0–46.0)
Hemoglobin: 12.4 g/dL (ref 12.0–15.0)
MCH: 27.9 pg (ref 26.0–34.0)
MCHC: 34.2 g/dL (ref 30.0–36.0)
MCV: 81.8 fL (ref 80.0–100.0)
Platelets: 287 10*3/uL (ref 150–400)
RBC: 4.44 MIL/uL (ref 3.87–5.11)
RDW: 13.6 % (ref 11.5–15.5)
WBC: 8.2 10*3/uL (ref 4.0–10.5)
nRBC: 0 % (ref 0.0–0.2)

## 2019-04-27 LAB — URINALYSIS, COMPLETE (UACMP) WITH MICROSCOPIC
Bacteria, UA: NONE SEEN
Bilirubin Urine: NEGATIVE
Glucose, UA: NEGATIVE mg/dL
Hgb urine dipstick: NEGATIVE
Ketones, ur: 20 mg/dL — AB
Leukocytes,Ua: NEGATIVE
Nitrite: NEGATIVE
Protein, ur: NEGATIVE mg/dL
Specific Gravity, Urine: 1.014 (ref 1.005–1.030)
pH: 7 (ref 5.0–8.0)

## 2019-04-27 LAB — COMPREHENSIVE METABOLIC PANEL
ALT: 18 U/L (ref 0–44)
AST: 24 U/L (ref 15–41)
Albumin: 4 g/dL (ref 3.5–5.0)
Alkaline Phosphatase: 78 U/L (ref 38–126)
Anion gap: 11 (ref 5–15)
BUN: 8 mg/dL (ref 8–23)
CO2: 23 mmol/L (ref 22–32)
Calcium: 8.8 mg/dL — ABNORMAL LOW (ref 8.9–10.3)
Chloride: 83 mmol/L — ABNORMAL LOW (ref 98–111)
Creatinine, Ser: 0.39 mg/dL — ABNORMAL LOW (ref 0.44–1.00)
GFR calc Af Amer: >60 mL/min (ref >60)
GFR calc non Af Amer: >60 mL/min (ref >60)
Glucose, Bld: 104 mg/dL — ABNORMAL HIGH (ref 70–99)
Potassium: 3.8 mmol/L (ref 3.5–5.1)
Sodium: 117 mmol/L — CL (ref 135–145)
Total Bilirubin: 0.6 mg/dL (ref 0.3–1.2)
Total Protein: 7.3 g/dL (ref 6.5–8.1)

## 2019-04-27 LAB — TROPONIN I (HIGH SENSITIVITY)
Troponin I (High Sensitivity): 2 ng/L (ref <18)
Troponin I (High Sensitivity): 2 ng/L (ref <18)
Troponin I (High Sensitivity): <2 ng/L (ref <18)

## 2019-04-27 LAB — SODIUM
Sodium: 118 mmol/L — CL (ref 135–145)
Sodium: 119 mmol/L — CL (ref 135–145)
Sodium: 118 mmol/L — CL (ref 135–145)

## 2019-04-27 LAB — SALICYLATE LEVEL: Salicylate Lvl: <7 mg/dL (ref 2.8–30.0)

## 2019-04-27 LAB — CORTISOL: Cortisol, Plasma: 37.2 ug/dL

## 2019-04-27 LAB — OSMOLALITY, URINE: Osmolality, Ur: 335 mOsm/kg (ref 300–900)

## 2019-04-27 LAB — SARS CORONAVIRUS 2 BY RT PCR (HOSPITAL ORDER, PERFORMED IN ~~LOC~~ HOSPITAL LAB): SARS Coronavirus 2: NEGATIVE

## 2019-04-27 LAB — OSMOLALITY: Osmolality: 245 mOsm/kg — CL (ref 275–295)

## 2019-04-27 LAB — ACETAMINOPHEN LEVEL: Acetaminophen (Tylenol), Serum: <10 ug/mL — ABNORMAL LOW (ref 10–30)

## 2019-04-27 LAB — TSH: TSH: 1.526 u[IU]/mL (ref 0.350–4.500)

## 2019-04-27 LAB — ETHANOL: Alcohol, Ethyl (B): <10 mg/dL (ref <10)

## 2019-04-27 LAB — LACTIC ACID, PLASMA
Lactic Acid, Venous: 1 mmol/L (ref 0.5–1.9)
Lactic Acid, Venous: 1 mmol/L (ref 0.5–1.9)

## 2019-04-27 MED ORDER — GABAPENTIN 300 MG PO CAPS
300.0000 mg | ORAL_CAPSULE | Freq: Four times a day (QID) | ORAL | Status: DC
  Administered 2019-04-27: 11:00:00 300 mg via ORAL
  Filled 2019-04-27: qty 1

## 2019-04-27 MED ORDER — HYDRALAZINE HCL 20 MG/ML IJ SOLN: 10.0000 mg | Freq: Four times a day (QID) | INTRAMUSCULAR | Status: DC | PRN

## 2019-04-27 MED ORDER — CITALOPRAM HYDROBROMIDE 20 MG PO TABS
40.0000 mg | ORAL_TABLET | Freq: Every day | ORAL | Status: DC
  Administered 2019-04-27 – 2019-04-28 (×2): 40 mg via ORAL
  Filled 2019-04-27 (×2): qty 2

## 2019-04-27 MED ORDER — METOPROLOL TARTRATE 50 MG PO TABS
100.0000 mg | ORAL_TABLET | Freq: Two times a day (BID) | ORAL | Status: DC
  Administered 2019-04-27 – 2019-05-05 (×15): 100 mg via ORAL
  Filled 2019-04-27 (×15): qty 2

## 2019-04-27 MED ORDER — LEVOTHYROXINE SODIUM 112 MCG PO TABS
112.0000 ug | ORAL_TABLET | Freq: Every day | ORAL | Status: DC
  Administered 2019-04-27 – 2019-05-05 (×9): 112 ug via ORAL
  Filled 2019-04-27 (×9): qty 1

## 2019-04-27 MED ORDER — ONDANSETRON HCL 4 MG PO TABS: 4.0000 mg | ORAL_TABLET | Freq: Four times a day (QID) | ORAL | Status: DC | PRN

## 2019-04-27 MED ORDER — ONDANSETRON HCL 4 MG/2ML IJ SOLN: 4.0000 mg | Freq: Four times a day (QID) | INTRAMUSCULAR | Status: DC | PRN

## 2019-04-27 MED ORDER — PANTOPRAZOLE SODIUM 40 MG PO TBEC
40.0000 mg | DELAYED_RELEASE_TABLET | Freq: Every day | ORAL | Status: DC
  Administered 2019-04-27 – 2019-04-28 (×2): 40 mg via ORAL
  Filled 2019-04-27 (×2): qty 1

## 2019-04-27 MED ORDER — ENOXAPARIN SODIUM 40 MG/0.4ML ~~LOC~~ SOLN
40.0000 mg | Freq: Two times a day (BID) | SUBCUTANEOUS | Status: DC
  Administered 2019-04-27 – 2019-05-03 (×13): 40 mg via SUBCUTANEOUS
  Filled 2019-04-27 (×13): qty 0.4

## 2019-04-27 MED ORDER — SODIUM CHLORIDE 0.9 % IV SOLN
INTRAVENOUS | Status: DC
  Administered 2019-04-27 – 2019-04-28 (×4): via INTRAVENOUS

## 2019-04-27 MED ORDER — ALBUTEROL SULFATE (2.5 MG/3ML) 0.083% IN NEBU
2.5000 mg | INHALATION_SOLUTION | RESPIRATORY_TRACT | Status: DC | PRN
  Administered 2019-04-28: 2.5 mg via RESPIRATORY_TRACT
  Filled 2019-04-27 (×3): qty 3

## 2019-04-27 MED ORDER — ADULT MULTIVITAMIN W/MINERALS CH
1.0000 | ORAL_TABLET | Freq: Every day | ORAL | Status: DC
  Administered 2019-04-27 – 2019-05-05 (×8): 1 via ORAL
  Filled 2019-04-27 (×9): qty 1

## 2019-04-27 MED ORDER — VITAMIN D3 25 MCG (1000 UNIT) PO TABS
2000.0000 [IU] | ORAL_TABLET | Freq: Every day | ORAL | Status: DC
  Administered 2019-04-27 – 2019-05-05 (×8): 2000 [IU] via ORAL
  Filled 2019-04-27 (×16): qty 2

## 2019-04-27 MED ORDER — GABAPENTIN 100 MG PO CAPS
200.0000 mg | ORAL_CAPSULE | Freq: Four times a day (QID) | ORAL | Status: DC
  Administered 2019-04-27 – 2019-04-28 (×7): 200 mg via ORAL
  Filled 2019-04-27 (×7): qty 2

## 2019-04-27 MED ORDER — SODIUM CHLORIDE 0.9 % IV BOLUS
1000.0000 mL | Freq: Once | INTRAVENOUS | Status: AC
  Administered 2019-04-27: 1000 mL via INTRAVENOUS

## 2019-04-27 MED ORDER — ACETAMINOPHEN 325 MG PO TABS
650.0000 mg | ORAL_TABLET | Freq: Four times a day (QID) | ORAL | Status: DC | PRN
  Administered 2019-04-27 – 2019-05-05 (×15): 650 mg via ORAL
  Filled 2019-04-27 (×17): qty 2

## 2019-04-27 MED ORDER — ACETAMINOPHEN 650 MG RE SUPP: 650.0000 mg | Freq: Four times a day (QID) | RECTAL | Status: DC | PRN

## 2019-04-27 MED ORDER — TIOTROPIUM BROMIDE MONOHYDRATE 18 MCG IN CAPS
18.0000 ug | ORAL_CAPSULE | Freq: Every day | RESPIRATORY_TRACT | Status: DC
  Administered 2019-04-27 – 2019-05-05 (×8): 18 ug via RESPIRATORY_TRACT
  Filled 2019-04-27 (×2): qty 5

## 2019-04-27 MED ORDER — PREDNISONE 20 MG PO TABS
40.0000 mg | ORAL_TABLET | Freq: Every day | ORAL | Status: DC
  Administered 2019-04-27 – 2019-05-05 (×9): 40 mg via ORAL
  Filled 2019-04-27 (×8): qty 2
  Filled 2019-04-27: qty 4

## 2019-04-27 MED ORDER — AMLODIPINE BESYLATE 5 MG PO TABS
5.0000 mg | ORAL_TABLET | Freq: Every day | ORAL | Status: DC
  Administered 2019-04-27 – 2019-05-05 (×8): 5 mg via ORAL
  Filled 2019-04-27 (×8): qty 1

## 2019-04-27 MED ORDER — POTASSIUM CHLORIDE CRYS ER 20 MEQ PO TBCR: 20.0000 meq | EXTENDED_RELEASE_TABLET | Freq: Every day | ORAL | Status: DC

## 2019-04-27 MED ORDER — DOCUSATE SODIUM 100 MG PO CAPS
100.0000 mg | ORAL_CAPSULE | Freq: Two times a day (BID) | ORAL | Status: DC
  Administered 2019-04-27 – 2019-05-05 (×15): 100 mg via ORAL
  Filled 2019-04-27 (×16): qty 1

## 2019-04-27 NOTE — H&P (Signed)
[name] is an 73 y.o. female.   Chief Complaint: Altered mental status HPI: The patient with past medical history of hypothyroidism, hypertension and COPD presents to the emergency department with confusion.  This is her fourth visit to the emergency department in 2 weeks. The patient also complains of headache and has had falls this week.  CT of her head showed no intracranial abnormality.  However, laboratory evaluation revealed sodium of 117.  Thus the emergency department staff called the hospitalist service for further management.  Past Medical History:  Diagnosis Date  . Allergy   . Asthma   . Back pain   . Bronchitis   . COPD (chronic obstructive pulmonary disease) ([place])   . Degenerative disc disease, lumbar   . Depression   . FHx: cholecystectomy   . GERD (gastroesophageal reflux disease)   . H/O: hysterectomy   . Headache   . Hypertension   . Murmur   . Osteoporosis   . Pneumonia 03/31/16   being treated by PCP  . Restless legs syndrome   . Scoliosis   . Shingles   . Thyroid disease     Past Surgical History:  Procedure Laterality Date  . ABDOMINAL HYSTERECTOMY    . APPENDECTOMY    . BACK SURGERY    . CHOLECYSTECTOMY    . COLONOSCOPY WITH PROPOFOL N/A 01/16/2018   Procedure: COLONOSCOPY WITH PROPOFOL;  Surgeon: Toledo, Benay Pike, MD;  Location: [facility] ENDOSCOPY;  Service: Gastroenterology;  Laterality: N/A;  . ESOPHAGOGASTRODUODENOSCOPY (EGD) WITH PROPOFOL N/A 01/16/2018   Procedure: ESOPHAGOGASTRODUODENOSCOPY (EGD) WITH PROPOFOL;  Surgeon: Toledo, Benay Pike, MD;  Location: [facility] ENDOSCOPY;  Service: Gastroenterology;  Laterality: N/A;  . JOINT REPLACEMENT Left 1998   shoulder  . NECK SURGERY Bilateral   . ROTATOR CUFF REPAIR Right   . SHOULDER OPEN ROTATOR CUFF REPAIR Right   . SHOULDER SURGERY Left    replacement    Family History  Problem Relation Age of Onset  . Kidney disease Father   . Heart disease Mother   . Diabetes Mother    Social History:   reports that she quit smoking about 2 years ago. Her smoking use included cigarettes. She has a 30.00 pack-year smoking history. She has never used smokeless tobacco. She reports that she does not drink alcohol or use drugs.  Allergies:  Allergies  Allergen Reactions  . Adhesive [Tape] Other (See Comments)  . Other Rash    Plastic tape    Medications Prior to Admission  Medication Sig Dispense Refill  . albuterol (PROVENTIL HFA;VENTOLIN HFA) 108 (90 Base) MCG/ACT inhaler Inhale 1-2 puffs into the lungs every 6 (six) hours as needed for wheezing or shortness of breath.    Marland Kitchen alendronate (FOSAMAX) 70 MG tablet Take 70 mg by mouth once a week. Take with a full glass of water on an empty stomach. On Sunday    . amLODipine (NORVASC) 5 MG tablet Take 5 mg by mouth daily.    . Cholecalciferol (VITAMIN D) 2000 UNITS tablet Take 2,000 Units by mouth daily.    . citalopram (CELEXA) 40 MG tablet Take 40 mg by mouth daily.     Marland Kitchen gabapentin (NEURONTIN) 300 MG capsule Take 300 mg by mouth 4 (four) times daily.    Marland Kitchen ibuprofen (ADVIL,MOTRIN) 200 MG tablet Take 400-800 mg by mouth every 6 (six) hours as needed for mild pain or moderate pain.    Marland Kitchen levothyroxine (SYNTHROID, LEVOTHROID) 112 MCG tablet Take 112 mcg by mouth daily  before breakfast.    . metoprolol (LOPRESSOR) 100 MG tablet Take 100 mg by mouth 2 (two) times daily.    . Multiple Vitamin (MULTIVITAMIN) tablet Take 1 tablet by mouth daily. Reported on 04/05/2016    . omeprazole (PRILOSEC) 20 MG capsule Take 20 mg by mouth 2 (two) times daily before a meal.     . potassium chloride SA (K-DUR,KLOR-CON) 20 MEQ tablet Take 20 mEq by mouth daily.    . predniSONE (DELTASONE) 20 MG tablet Take 2 tablets (40 mg total) by mouth daily. 10 tablet 0  . tiotropium (SPIRIVA HANDIHALER) 18 MCG inhalation capsule Place 1 capsule (18 mcg total) into inhaler and inhale daily. 30 capsule 12  . traMADol (ULTRAM) 50 MG tablet Take 1 tablet (50 mg total) by mouth every 6  (six) hours as needed for moderate pain or severe pain. 12 tablet 0    Results for orders placed or performed during the hospital encounter of 04/27/19 (from the past 48 hour(s))  Urinalysis, Complete w Microscopic     Status: Abnormal   Collection Time: 04/27/19 12:33 AM  Result Value Ref Range   Color, Urine YELLOW (A) YELLOW   APPearance CLEAR (A) CLEAR   Specific Gravity, Urine 1.014 1.005 - 1.030   pH 7.0 5.0 - 8.0   Glucose, UA NEGATIVE NEGATIVE mg/dL   Hgb urine dipstick NEGATIVE NEGATIVE   Bilirubin Urine NEGATIVE NEGATIVE   Ketones, ur 20 (A) NEGATIVE mg/dL   Protein, ur NEGATIVE NEGATIVE mg/dL   Nitrite NEGATIVE NEGATIVE   Leukocytes,Ua NEGATIVE NEGATIVE   RBC / HPF 0-5 0 - 5 RBC/hpf   WBC, UA 0-5 0 - 5 WBC/hpf   Bacteria, UA NONE SEEN NONE SEEN   Squamous Epithelial / LPF 0-5 0 - 5   Mucus PRESENT     Comment: Performed at [facility], [address], [place], [place] [address]  SARS Coronavirus 2 (CEPHEID - Performed in [place] hospital lab), Hosp Order     Status: None   Collection Time: 04/27/19  3:19 AM   Specimen: Nasopharyngeal Swab  Result Value Ref Range   SARS Coronavirus 2 NEGATIVE NEGATIVE    Comment: (NOTE) If result is NEGATIVE SARS-CoV-2 target nucleic acids are NOT DETECTED. The SARS-CoV-2 RNA is generally detectable in upper and lower  respiratory specimens during the acute phase of infection. The lowest  concentration of SARS-CoV-2 viral copies this assay can detect is 250  copies / mL. A negative result does not preclude SARS-CoV-2 infection  and should not be used as the sole basis for treatment or other  patient management decisions.  A negative result may occur with  improper specimen collection / handling, submission of specimen other  than nasopharyngeal swab, presence of viral mutation(s) within the  areas targeted by this assay, and inadequate number of viral copies  (<250 copies / mL). A negative result must be combined with  clinical  observations, patient history, and epidemiological information. If result is POSITIVE SARS-CoV-2 target nucleic acids are DETECTED. The SARS-CoV-2 RNA is generally detectable in upper and lower  respiratory specimens dur ing the acute phase of infection.  Positive  results are indicative of active infection with SARS-CoV-2.  Clinical  correlation with patient history and other diagnostic information is  necessary to determine patient infection status.  Positive results do  not rule out bacterial infection or co-infection with other viruses. If result is PRESUMPTIVE POSTIVE SARS-CoV-2 nucleic acids MAY BE PRESENT.   A presumptive  positive result was obtained on the submitted specimen  and confirmed on repeat testing.  While 2019 novel coronavirus  (SARS-CoV-2) nucleic acids may be present in the submitted sample  additional confirmatory testing may be necessary for epidemiological  and / or clinical management purposes  to differentiate between  SARS-CoV-2 and other Sarbecovirus currently known to infect humans.  If clinically indicated additional testing with an alternate test  methodology [id number]) is advised. The SARS-CoV-2 RNA is generally  detectable in upper and lower respiratory sp ecimens during the acute  phase of infection. The expected result is Negative. Fact Sheet for Patients:  StrictlyIdeas.no Fact Sheet for Healthcare Providers: BankingDealers.co.za This test is not yet approved or cleared by the [place] FDA and has been authorized for detection and/or diagnosis of SARS-CoV-2 by FDA under an Emergency Use Authorization (EUA).  This EUA will remain in effect (meaning this test can be used) for the duration of the COVID-19 declaration under Section 564(b)(1) of the Act, 21 U.S.C. section 360bbb-3(b)(1), unless the authorization is terminated or revoked sooner. Performed at [facility], [address] [place], [place] [address]   Comprehensive metabolic panel     Status: Abnormal   Collection Time: 04/27/19  3:40 AM  Result Value Ref Range   Sodium 117 (LL) 135 - 145 mmol/L    Comment: CRITICAL RESULT CALLED TO, READ BACK BY AND VERIFIED WITH SONJIA WEAVER AT 4818 ON 04/27/19 RWW    Potassium 3.8 3.5 - 5.1 mmol/L   Chloride 83 (L) 98 - 111 mmol/L   CO2 23 22 - 32 mmol/L   Glucose, Bld 104 (H) 70 - 99 mg/dL   BUN 8 8 - 23 mg/dL   Creatinine, Ser 0.39 (L) 0.44 - 1.00 mg/dL   Calcium 8.8 (L) 8.9 - 10.3 mg/dL   Total Protein 7.3 6.5 - 8.1 g/dL   Albumin 4.0 3.5 - 5.0 g/dL   AST 24 15 - 41 U/L   ALT 18 0 - 44 U/L   Alkaline Phosphatase 78 38 - 126 U/L   Total Bilirubin 0.6 0.3 - 1.2 mg/dL   GFR calc non Af Amer >60 >60 mL/min   GFR calc Af Amer >60 >60 mL/min   Anion gap 11 5 - 15    Comment: Performed at [facility], [address] [place], [place] [address]  CBC     Status: None   Collection Time: 04/27/19  3:40 AM  Result Value Ref Range   WBC 8.2 4.0 - 10.5 K/uL   RBC 4.44 3.87 - 5.11 MIL/uL   Hemoglobin 12.4 12.0 - 15.0 g/dL   HCT 36.3 36.0 - 46.0 %   MCV 81.8 80.0 - 100.0 fL   MCH 27.9 26.0 - 34.0 pg   MCHC 34.2 30.0 - 36.0 g/dL   RDW 13.6 11.5 - 15.5 %   Platelets 287 150 - 400 K/uL   nRBC 0.0 0.0 - 0.2 %    Comment: Performed at [facility], [address] [place], [place] [address]  Ethanol     Status: None   Collection Time: 04/27/19  3:40 AM  Result Value Ref Range   Alcohol, Ethyl (B) <10 <10 mg/dL    Comment: (NOTE) Lowest detectable limit for serum alcohol is 10 mg/dL. For medical purposes only. Performed at [facility], [address], [place], [place] [address]   Troponin I (High Sensitivity)     Status: None   Collection Time: 04/27/19  3:40 AM  Result Value Ref Range   Troponin I (High Sensitivity) 2 <18 ng/L    Comment: (NOTE) Elevated high sensitivity troponin I (hsTnI) values and significant  changes  across serial measurements may suggest ACS but many other  chronic and acute conditions are known to elevate hsTnI results.  Refer to the "Links" section for chest pain algorithms and additional  guidance. Performed at [facility], [address] [place], [place] [address]   Lactic acid, plasma     Status: None   Collection Time: 04/27/19  3:40 AM  Result Value Ref Range   Lactic Acid, Venous 1.0 0.5 - 1.9 mmol/L    Comment: Performed at [facility], [place], [place] [address]  Acetaminophen level     Status: Abnormal   Collection Time: 04/27/19  3:40 AM  Result Value Ref Range   Acetaminophen (Tylenol), Serum <10 (L) 10 - 30 ug/mL    Comment: (NOTE) Therapeutic concentrations vary significantly. A range of 10-30 ug/mL  may be an effective concentration for many patients. However, some  are best treated at concentrations outside of this range. Acetaminophen concentrations >150 ug/mL at 4 hours after ingestion  and >50 ug/mL at 12 hours after ingestion are often associated with  toxic reactions. Performed at [facility], [address] [place], [place] [address]   Salicylate level     Status: None   Collection Time: 04/27/19  3:40 AM  Result Value Ref Range   Salicylate Lvl <1.0 2.8 - 30.0 mg/dL    Comment: Performed at [facility], [address] [place], [place] [address]  Lactic acid, plasma     Status: None   Collection Time: 04/27/19  6:47 AM  Result Value Ref Range   Lactic Acid, Venous 1.0 0.5 - 1.9 mmol/L    Comment: Performed at [facility], [place], [place] [address]  Troponin I (High Sensitivity)     Status: None   Collection Time: 04/27/19  6:47 AM  Result Value Ref Range   Troponin I (High Sensitivity) 2 <18 ng/L    Comment: (NOTE) Elevated high sensitivity troponin I (hsTnI) values and significant  changes across serial measurements may suggest ACS but many other  chronic  and acute conditions are known to elevate hsTnI results.  Refer to the "Links" section for chest pain algorithms and additional  guidance. Performed at [facility], [place],  [address]    Ct Head Wo Contrast  Result Date: 04/27/2019 CLINICAL DATA:  Encephalopathy and headache EXAM: CT HEAD WITHOUT CONTRAST TECHNIQUE: Contiguous axial images were obtained from the base of the skull through the vertex without intravenous contrast. COMPARISON:  04/25/2019 FINDINGS: Brain: There is no mass, hemorrhage or extra-axial collection. There is generalized atrophy without lobar predilection. Hypodensity of the white matter is most commonly associated with chronic microvascular disease. Vascular: No abnormal hyperdensity of the major intracranial arteries or dural venous sinuses. No intracranial atherosclerosis. Skull: The visualized skull base, calvarium and extracranial soft tissues are normal. Sinuses/Orbits: No fluid levels or advanced mucosal thickening of the visualized paranasal sinuses. No mastoid or middle ear effusion. The orbits are normal. IMPRESSION: Chronic small vessel ischemia without acute intracranial abnormality. Electronically Signed   By: Ulyses Jarred M.D.   On: 04/27/2019 01:15   Ct Head Wo Contrast  Result Date: 04/25/2019 CLINICAL DATA:  [place] on Wednesday striking head, multiple recent falls, imbalance, headache since fall, denies  loss of consciousness EXAM: CT HEAD WITHOUT CONTRAST CT CERVICAL SPINE WITHOUT CONTRAST TECHNIQUE: Multidetector CT imaging of the head and cervical spine was performed following the standard protocol without intravenous contrast. Multiplanar CT image reconstructions of the cervical spine were also generated. Motion artifacts on CT head despite repeating images. COMPARISON:  04/21/2019 FINDINGS: CT HEAD FINDINGS Brain: Generalized atrophy. Normal ventricular morphology. No midline shift or mass effect. Small vessel chronic ischemic  changes of deep cerebral white matter. No intracranial hemorrhage, mass lesion, evidence of acute infarction, or extra-axial fluid collection. Vascular: No hyperdense vessels. Mild atherosclerotic calcifications of internal carotid arteries at skull base Skull: Grossly intact Sinuses/Orbits: Chronic opacification and osseous thickening of LEFT maxillary sinus. Remaining sinuses and mastoid air cells clear. Other: N/A CT CERVICAL SPINE FINDINGS Alignment: Mild anterolisthesis C3-C4 unchanged. Remaining alignments normal. Skull base and vertebrae: Osseous demineralization. Multilevel facet degenerative changes. Prior anterior fusion of C4-C7. Disc space narrowing C3-C4, C7-T1. No acute fracture or bone destruction. Soft tissues and spinal canal: Fullness of prevertebral soft tissues anterior to the surgical plate, similar to prior study. Atherosclerotic calcifications in the carotid systems and at aortic arch. Disc levels: Portions of disc spaces and spinal canal are obscured by beam hardening artifacts from fixation hardware. Upper chest: Lung apices clear Other: N/A IMPRESSION: Atrophy with small vessel chronic ischemic changes of deep cerebral white matter. No acute cervical spine abnormalities. Scattered degenerative disc and facet disease changes of the cervical spine with postsurgical changes from prior C4-C7 ACDF. No acute cervical spine abnormalities. Electronically Signed   By: Lavonia Dana M.D.   On: 04/25/2019 13:02   Ct Cervical Spine Wo Contrast  Result Date: 04/25/2019 CLINICAL DATA:  [place] striking head, multiple recent falls, imbalance, headache since fall, denies loss of consciousness EXAM: CT HEAD WITHOUT CONTRAST CT CERVICAL SPINE WITHOUT CONTRAST TECHNIQUE: Multidetector CT imaging of the head and cervical spine was performed following the standard protocol without intravenous contrast. Multiplanar CT image reconstructions of the cervical spine were also generated. Motion artifacts  on CT head despite repeating images. COMPARISON:  04/21/2019 FINDINGS: CT HEAD FINDINGS Brain: Generalized atrophy. Normal ventricular morphology. No midline shift or mass effect. Small vessel chronic ischemic changes of deep cerebral white matter. No intracranial hemorrhage, mass lesion, evidence of acute infarction, or extra-axial fluid collection. Vascular: No hyperdense vessels. Mild atherosclerotic calcifications of internal carotid arteries at skull base Skull: Grossly intact Sinuses/Orbits: Chronic opacification and osseous thickening of LEFT maxillary sinus. Remaining sinuses and mastoid air cells clear. Other: N/A CT CERVICAL SPINE FINDINGS Alignment: Mild anterolisthesis C3-C4 unchanged. Remaining alignments normal. Skull base and vertebrae: Osseous demineralization. Multilevel facet degenerative changes. Prior anterior fusion of C4-C7. Disc space narrowing C3-C4, C7-T1. No acute fracture or bone destruction. Soft tissues and spinal canal: Fullness of prevertebral soft tissues anterior to the surgical plate, similar to prior study. Atherosclerotic calcifications in the carotid systems and at aortic arch. Disc levels: Portions of disc spaces and spinal canal are obscured by beam hardening artifacts from fixation hardware. Upper chest: Lung apices clear Other: N/A IMPRESSION: Atrophy with small vessel chronic ischemic changes of deep cerebral white matter. No acute cervical spine abnormalities. Scattered degenerative disc and facet disease changes of the cervical spine with postsurgical changes from prior C4-C7 ACDF. No acute cervical spine abnormalities. Electronically Signed   By: Lavonia Dana M.D.   On: 04/25/2019 13:02   Dg Chest Port 1 View  Result Date: 04/27/2019 CLINICAL DATA:  Confusion EXAM: PORTABLE CHEST 1  VIEW COMPARISON:  12/05/2016 FINDINGS: There are end-stage degenerative changes of the right shoulder. The patient is status post total shoulder arthroplasty on the left. The heart size is  mildly enlarged. Say again noted is a moderate to large hiatal hernia. Unchanged short 80 cc there is no pneumothorax is as. No large pleural effusions. No focal infiltrate. IMPRESSION: No active disease. Electronically Signed   By: Constance Holster M.D.   On: 04/27/2019 03:03    Review of Systems  Constitutional: Negative for chills and fever.  HENT: Negative for sore throat and tinnitus.   Eyes: Negative for blurred vision and redness.  Respiratory: Negative for cough and shortness of breath.   Cardiovascular: Negative for chest pain, palpitations, orthopnea and PND.  Gastrointestinal: Negative for abdominal pain, diarrhea, nausea and vomiting.  Genitourinary: Negative for dysuria, frequency and urgency.  Musculoskeletal: Negative for joint pain and myalgias.  Skin: Negative for rash.       No lesions  Neurological: Positive for weakness. Negative for speech change and focal weakness.  Endo/Heme/Allergies: Does not bruise/bleed easily.       No temperature intolerance  Psychiatric/Behavioral: Negative for depression and suicidal ideas.    Blood pressure (!) 137/95, pulse 87, temperature 97.9 F (36.6 C), resp. rate 17, height 4\' 10"  (1.473 m), weight 92 kg, SpO2 94 %. Physical Exam  Vitals reviewed. Constitutional: She is oriented to person, place, and time. She appears well-developed and well-nourished. No distress.  HENT:  Head: Normocephalic and atraumatic.  Mouth/Throat: Oropharynx is clear and moist.  Eyes: Pupils are equal, round, and reactive to light. Conjunctivae and EOM are normal. No scleral icterus.  Neck: Normal range of motion. Neck supple. No JVD present. No tracheal deviation present. No thyromegaly present.  Cardiovascular: Normal rate, regular rhythm and normal heart sounds. Exam reveals no gallop and no friction rub.  No murmur heard. Respiratory: Effort normal and breath sounds normal.  GI: Soft. Bowel sounds are normal. She exhibits no distension. There is no  abdominal tenderness.  Genitourinary:    Genitourinary Comments: Deferred   Musculoskeletal: Normal range of motion.        General: No edema.  Lymphadenopathy:    She has no cervical adenopathy.  Neurological: She is alert and oriented to person, place, and time. No cranial nerve deficit. She exhibits normal muscle tone.  Skin: Skin is warm and dry. No rash noted. No erythema.  Psychiatric: She has a normal mood and affect. Her behavior is normal. Judgment and thought content normal.     Assessment/Plan This is a 73 year old female admitted for hyponatremia. 1.  Hyponatremia: No seizure confusion is intermittent.  Thus I have not started hypertonic saline.  Continue normal saline and encourage p.o. intake.  Check sodium regularly to avoid increase of more than 0.5 mEq/h. 2.  Hypertension: Intermittently controlled.  Continue metoprolol and amlodipine.  Labetalol as needed. 3.  Hypothyroidism: Check TSH; continue Synthroid 4.  Falls: Fall precautions.  PT and OT prior to discharge. 5.  DVT prophylaxis: Lovenox 6.  GI prophylaxis: Pantoprazole per home regimen The patient is a full code.  Time spent on admission orders and patient care approximately 45 minutes  Harrie Foreman, MD 04/27/2019, 8:00 AM

## 2019-04-27 NOTE — Consult Note (Addendum)
[facility] Face-to-Face Psychiatry Consult   Reason for Consult:  Hallucinations  Referring Physician:  Dr Stark Jock Patient Identification: [name] MRN:  [id number] Principal Diagnosis: Hyponatremia Diagnosis:  Active Problems:   Hallucinations, visual   Hyponatremia  Total Time spent with patient: 1 hour  Subjective:   [name] is a 73 y.o. female patient seen and evaluated for hallucinations.  When asked about hallucinations, she responded with "I would if could tell you what they were.  My children told me yesterday that I was seeing things, green things."  Denies current hallucinations with, "Well duh, no."  Confounding variables of hyponatremia, recent head injury, and prednisone use daily.  Her TSH level is in the normal limit.  Not responding on assessment.  Endorses depression of "not bad", taking Celexa.  Anxiety of "yes", mild.  Denies suicidal/homicidal ideations and substance abuse.  No medication recommendation at this time for her hallucinations as this issue appears to have resolved.  Continue Celexa 40 mg daily for depression and anxiety.  Taper off gabapentin as this could be the causative factor in her hyponatremia with a side effect of dizziness and unsteadiness which could be contributing to her frequent falls (current dose of 300 mg QID decreased to 200 mg QID, see treatment plan below).  HPI:  On admission:  [name] is a 73 y.o. female brought to the ED from home by her family with a chief complaint of persistent headache and now visual hallucinations.  Patient had a fall with minor head injury approximately 12 days ago.  He was seen initially with negative CT head.  She has returned on 6/22 and 6/27 for persistent headaches.  Tonight her family brought her in under IVC because she is now hallucinating that her legs are green.  Patient denies any knowledge of this but states "I guess I am because that is what they are telling me".  Denies active SI/HI/AH.  States headache  is generally better.  Family also reported that patient has been having more frequent falls and generalized weakness.  Patient found to have hyponatremia of 117, admitted to the medical floor.  Past Psychiatric History: depression, anxiety  Risk to Self:  none Risk to Others:  none Prior Inpatient Therapy:  none Prior Outpatient Therapy:  PCP  Past Medical History:  Past Medical History:  Diagnosis Date  . Allergy   . Asthma   . Back pain   . Bronchitis   . COPD (chronic obstructive pulmonary disease) ([place])   . Degenerative disc disease, lumbar   . Depression   . FHx: cholecystectomy   . GERD (gastroesophageal reflux disease)   . H/O: hysterectomy   . Headache   . Hypertension   . Murmur   . Osteoporosis   . Pneumonia 03/31/16   being treated by PCP  . Restless legs syndrome   . Scoliosis   . Shingles   . Thyroid disease     Past Surgical History:  Procedure Laterality Date  . ABDOMINAL HYSTERECTOMY    . APPENDECTOMY    . BACK SURGERY    . CHOLECYSTECTOMY    . COLONOSCOPY WITH PROPOFOL N/A 01/16/2018   Procedure: COLONOSCOPY WITH PROPOFOL;  Surgeon: Toledo, Benay Pike, MD;  Location: [facility] ENDOSCOPY;  Service: Gastroenterology;  Laterality: N/A;  . ESOPHAGOGASTRODUODENOSCOPY (EGD) WITH PROPOFOL N/A 01/16/2018   Procedure: ESOPHAGOGASTRODUODENOSCOPY (EGD) WITH PROPOFOL;  Surgeon: Toledo, Benay Pike, MD;  Location: [facility] ENDOSCOPY;  Service: Gastroenterology;  Laterality: N/A;  . JOINT REPLACEMENT  Left 1998   shoulder  . NECK SURGERY Bilateral   . ROTATOR CUFF REPAIR Right   . SHOULDER OPEN ROTATOR CUFF REPAIR Right   . SHOULDER SURGERY Left    replacement   Family History:  Family History  Problem Relation Age of Onset  . Kidney disease Father   . Heart disease Mother   . Diabetes Mother    Family Psychiatric  History: none Social History:  Social History   Substance and Sexual Activity  Alcohol Use No  . Alcohol/week: 0.0 standard drinks     Social History    Substance and Sexual Activity  Drug Use No    Social History   Socioeconomic History  . Marital status: Married    Spouse name: Not on file  . Number of children: Not on file  . Years of education: Not on file  . Highest education level: Not on file  Occupational History  . Not on file  Social Needs  . Financial resource strain: Not on file  . Food insecurity    Worry: Not on file    Inability: Not on file  . Transportation needs    Medical: Not on file    Non-medical: Not on file  Tobacco Use  . Smoking status: Former Smoker    Packs/day: 1.00    Years: 30.00    Pack years: 30.00    Types: Cigarettes    Quit date: 11/04/2016    Years since quitting: 2.4  . Smokeless tobacco: Never Used  Substance and Sexual Activity  . Alcohol use: No    Alcohol/week: 0.0 standard drinks  . Drug use: No  . Sexual activity: Not on file  Lifestyle  . Physical activity    Days per week: Not on file    Minutes per session: Not on file  . Stress: Not on file  Relationships  . Social Herbalist on phone: Not on file    Gets together: Not on file    Attends religious service: Not on file    Active member of club or organization: Not on file    Attends meetings of clubs or organizations: Not on file    Relationship status: Not on file  Other Topics Concern  . Not on file  Social History Narrative  . Not on file   Additional Social History:    Allergies:   Allergies  Allergen Reactions  . Adhesive [Tape] Other (See Comments)  . Other Rash    Plastic tape    Labs:  Results for orders placed or performed during the hospital encounter of 04/27/19 (from the past 48 hour(s))  Urinalysis, Complete w Microscopic     Status: Abnormal   Collection Time: 04/27/19 12:33 AM  Result Value Ref Range   Color, Urine YELLOW (A) YELLOW   APPearance CLEAR (A) CLEAR   Specific Gravity, Urine 1.014 1.005 - 1.030   pH 7.0 5.0 - 8.0   Glucose, UA NEGATIVE NEGATIVE mg/dL   Hgb  urine dipstick NEGATIVE NEGATIVE   Bilirubin Urine NEGATIVE NEGATIVE   Ketones, ur 20 (A) NEGATIVE mg/dL   Protein, ur NEGATIVE NEGATIVE mg/dL   Nitrite NEGATIVE NEGATIVE   Leukocytes,Ua NEGATIVE NEGATIVE   RBC / HPF 0-5 0 - 5 RBC/hpf   WBC, UA 0-5 0 - 5 WBC/hpf   Bacteria, UA NONE SEEN NONE SEEN   Squamous Epithelial / LPF 0-5 0 - 5   Mucus PRESENT     Comment: Performed  at [facility], [address] [place], [place] [address]  SARS Coronavirus 2 (CEPHEID - Performed in [facility] hospital lab), Hosp Order     Status: None   Collection Time: 04/27/19  3:19 AM   Specimen: Nasopharyngeal Swab  Result Value Ref Range   SARS Coronavirus 2 NEGATIVE NEGATIVE    Comment: (NOTE) If result is NEGATIVE SARS-CoV-2 target nucleic acids are NOT DETECTED. The SARS-CoV-2 RNA is generally detectable in upper and lower  respiratory specimens during the acute phase of infection. The lowest  concentration of SARS-CoV-2 viral copies this assay can detect is 250  copies / mL. A negative result does not preclude SARS-CoV-2 infection  and should not be used as the sole basis for treatment or other  patient management decisions.  A negative result may occur with  improper specimen collection / handling, submission of specimen other  than nasopharyngeal swab, presence of viral mutation(s) within the  areas targeted by this assay, and inadequate number of viral copies  (<250 copies / mL). A negative result must be combined with clinical  observations, patient history, and epidemiological information. If result is POSITIVE SARS-CoV-2 target nucleic acids are DETECTED. The SARS-CoV-2 RNA is generally detectable in upper and lower  respiratory specimens dur ing the acute phase of infection.  Positive  results are indicative of active infection with SARS-CoV-2.  Clinical  correlation with patient history and other diagnostic information is  necessary to determine patient infection status.   Positive results do  not rule out bacterial infection or co-infection with other viruses. If result is PRESUMPTIVE POSTIVE SARS-CoV-2 nucleic acids MAY BE PRESENT.   A presumptive positive result was obtained on the submitted specimen  and confirmed on repeat testing.  While 2019 novel coronavirus  (SARS-CoV-2) nucleic acids may be present in the submitted sample  additional confirmatory testing may be necessary for epidemiological  and / or clinical management purposes  to differentiate between  SARS-CoV-2 and other Sarbecovirus currently known to infect humans.  If clinically indicated additional testing with an alternate test  methodology [phone]) is advised. The SARS-CoV-2 RNA is generally  detectable in upper and lower respiratory sp ecimens during the acute  phase of infection. The expected result is Negative. Fact Sheet for Patients:  StrictlyIdeas.no Fact Sheet for Healthcare Providers: BankingDealers.co.za This test is not yet approved or cleared by the [place] FDA and has been authorized for detection and/or diagnosis of SARS-CoV-2 by FDA under an Emergency Use Authorization (EUA).  This EUA will remain in effect (meaning this test can be used) for the duration of the COVID-19 declaration under Section 564(b)(1) of the Act, 21 U.S.C. section 360bbb-3(b)(1), unless the authorization is terminated or revoked sooner. Performed at [facility], [address] [place], [place] [address]   Comprehensive metabolic panel     Status: Abnormal   Collection Time: 04/27/19  3:40 AM  Result Value Ref Range   Sodium 117 (LL) 135 - 145 mmol/L    Comment: CRITICAL RESULT CALLED TO, READ BACK BY AND VERIFIED WITH SONJIA WEAVER AT 0432 ON 04/27/19 RWW    Potassium 3.8 3.5 - 5.1 mmol/L   Chloride 83 (L) 98 - 111 mmol/L   CO2 23 22 - 32 mmol/L   Glucose, Bld 104 (H) 70 - 99 mg/dL   BUN 8 8 - 23 mg/dL   Creatinine, Ser 0.39  (L) 0.44 - 1.00 mg/dL   Calcium 8.8 (L) 8.9 - 10.3 mg/dL   Total  Protein 7.3 6.5 - 8.1 g/dL   Albumin 4.0 3.5 - 5.0 g/dL   AST 24 15 - 41 U/L   ALT 18 0 - 44 U/L   Alkaline Phosphatase 78 38 - 126 U/L   Total Bilirubin 0.6 0.3 - 1.2 mg/dL   GFR calc non Af Amer >60 >60 mL/min   GFR calc Af Amer >60 >60 mL/min   Anion gap 11 5 - 15    Comment: Performed at [facility], [address] [place], [place] [address]  CBC     Status: None   Collection Time: 04/27/19  3:40 AM  Result Value Ref Range   WBC 8.2 4.0 - 10.5 K/uL   RBC 4.44 3.87 - 5.11 MIL/uL   Hemoglobin 12.4 12.0 - 15.0 g/dL   HCT 36.3 36.0 - 46.0 %   MCV 81.8 80.0 - 100.0 fL   MCH 27.9 26.0 - 34.0 pg   MCHC 34.2 30.0 - 36.0 g/dL   RDW 13.6 11.5 - 15.5 %   Platelets 287 150 - 400 K/uL   nRBC 0.0 0.0 - 0.2 %    Comment: Performed at [facility], [address] [place], [place] [address]  Ethanol     Status: None   Collection Time: 04/27/19  3:40 AM  Result Value Ref Range   Alcohol, Ethyl (B) <10 <10 mg/dL    Comment: (NOTE) Lowest detectable limit for serum alcohol is 10 mg/dL. For medical purposes only. Performed at [facility], [place], [place] [address]   Troponin I (High Sensitivity)     Status: None   Collection Time: 04/27/19  3:40 AM  Result Value Ref Range   Troponin I (High Sensitivity) 2 <18 ng/L    Comment: (NOTE) Elevated high sensitivity troponin I (hsTnI) values and significant  changes across serial measurements may suggest ACS but many other  chronic and acute conditions are known to elevate hsTnI results.  Refer to the "Links" section for chest pain algorithms and additional  guidance. Performed at [facility], [address] [place], [place] [address]   Lactic acid, plasma     Status: None   Collection Time: 04/27/19  3:40 AM  Result Value Ref Range   Lactic Acid, Venous 1.0 0.5 - 1.9 mmol/L    Comment: Performed at [facility], [place], [place] [address]  Acetaminophen level     Status: Abnormal   Collection Time: 04/27/19  3:40 AM  Result Value Ref Range   Acetaminophen (Tylenol), Serum <10 (L) 10 - 30 ug/mL    Comment: (NOTE) Therapeutic concentrations vary significantly. A range of 10-30 ug/mL  may be an effective concentration for many patients. However, some  are best treated at concentrations outside of this range. Acetaminophen concentrations >150 ug/mL at 4 hours after ingestion  and >50 ug/mL at 12 hours after ingestion are often associated with  toxic reactions. Performed at [facility], [address] [place], [place] [address]   Salicylate level     Status: None   Collection Time: 04/27/19  3:40 AM  Result Value Ref Range   Salicylate Lvl <0.1 2.8 - 30.0 mg/dL    Comment: Performed at [facility], [address] [place], [place] [address]  Lactic acid, plasma     Status: None   Collection Time: 04/27/19  6:47 AM  Result Value Ref Range   Lactic Acid, Venous 1.0 0.5 - 1.9 mmol/L  Comment: Performed at [facility], [place], [place] [address]  Troponin I (High Sensitivity)     Status: None   Collection Time: 04/27/19  6:47 AM  Result Value Ref Range   Troponin I (High Sensitivity) 2 <18 ng/L    Comment: (NOTE) Elevated high sensitivity troponin I (hsTnI) values and significant  changes across serial measurements may suggest ACS but many other  chronic and acute conditions are known to elevate hsTnI results.  Refer to the "Links" section for chest pain algorithms and additional  guidance. Performed at [facility], [place], [place] [address]   Troponin I (High Sensitivity)     Status: None   Collection Time: 04/27/19  8:21 AM  Result Value Ref Range   Troponin I (High Sensitivity) <2 <18 ng/L    Comment: (NOTE) Elevated high sensitivity troponin I (hsTnI) values and significant   changes across serial measurements may suggest ACS but many other  chronic and acute conditions are known to elevate hsTnI results.  Refer to the "Links" section for chest pain algorithms and additional  guidance. Performed at [facility], [address] [place], [place] [address]   TSH     Status: None   Collection Time: 04/27/19  8:21 AM  Result Value Ref Range   TSH 1.526 0.350 - 4.500 uIU/mL    Comment: Performed by a 3rd Generation assay with a functional sensitivity of <=0.01 uIU/mL. Performed at [facility], [address] [place], [place] [address]     Current Facility-Administered Medications  Medication Dose Route Frequency Provider Last Rate Last Dose  . 0.9 %  sodium chloride infusion   Intravenous Continuous Harrie Foreman, MD 125 mL/hr at 04/27/19 [phone]    . acetaminophen (TYLENOL) tablet 650 mg  650 mg Oral Q6H PRN Harrie Foreman, MD       Or  . acetaminophen (TYLENOL) suppository 650 mg  650 mg Rectal Q6H PRN Harrie Foreman, MD      . albuterol (PROVENTIL) (2.5 MG/3ML) 0.083% nebulizer solution 2.5 mg  2.5 mg Nebulization Q4H PRN Harrie Foreman, MD      . amLODipine (NORVASC) tablet 5 mg  5 mg Oral Daily Harrie Foreman, MD   5 mg at 04/27/19 1124  . cholecalciferol (VITAMIN D) tablet 2,000 Units  2,000 Units Oral Daily Harrie Foreman, MD   2,000 Units at 04/27/19 1125  . citalopram (CELEXA) tablet 40 mg  40 mg Oral Daily Harrie Foreman, MD   40 mg at 04/27/19 1126  . docusate sodium (COLACE) capsule 100 mg  100 mg Oral BID Harrie Foreman, MD   100 mg at 04/27/19 1126  . enoxaparin (LOVENOX) injection 40 mg  40 mg Subcutaneous Q12H Harrie Foreman, MD   40 mg at 04/27/19 1127  . gabapentin (NEURONTIN) capsule 300 mg  300 mg Oral QID Harrie Foreman, MD   300 mg at 04/27/19 1127  . hydrALAZINE (APRESOLINE) injection 10 mg  10 mg Intravenous Q6H PRN Ojie, Jude, MD      . levothyroxine (SYNTHROID) tablet 112 mcg   112 mcg Oral QAC breakfast Harrie Foreman, MD   112 mcg at 04/27/19 1129  . metoprolol tartrate (LOPRESSOR) tablet 100 mg  100 mg Oral BID Harrie Foreman, MD   100 mg at 04/27/19 1130  . multivitamin with minerals tablet 1 tablet  1 tablet Oral Daily Harrie Foreman, MD  1 tablet at 04/27/19 1130  . ondansetron (ZOFRAN) tablet 4 mg  4 mg Oral Q6H PRN Harrie Foreman, MD       Or  . ondansetron [facility]) injection 4 mg  4 mg Intravenous Q6H PRN Harrie Foreman, MD      . pantoprazole (PROTONIX) EC tablet 40 mg  40 mg Oral Daily Harrie Foreman, MD   40 mg at 04/27/19 1131  . [START ON 04/28/2019] potassium chloride SA (K-DUR) CR tablet 20 mEq  20 mEq Oral Daily Harrie Foreman, MD      . predniSONE (DELTASONE) tablet 40 mg  40 mg Oral Q breakfast Harrie Foreman, MD   40 mg at 04/27/19 1131  . tiotropium (SPIRIVA) inhalation capsule ([facility] use ONLY) 18 mcg  18 mcg Inhalation Daily Harrie Foreman, MD   18 mcg at 04/27/19 1131    Musculoskeletal: Strength & Muscle Tone: decreased Gait & Station: did not witness Patient leans: N/A  Psychiatric Specialty Exam: Physical Exam  Nursing note and vitals reviewed. Constitutional: She is oriented to person, place, and time. She appears well-developed and well-nourished.  HENT:  Head: Normocephalic.  Neck: Normal range of motion.  Respiratory: Effort normal.  Neurological: She is alert and oriented to person, place, and time.  Psychiatric: Her speech is normal and behavior is normal. Judgment and thought content normal. Her mood appears anxious. Her affect is blunt. Cognition and memory are normal. She exhibits a depressed mood.    Review of Systems  Constitutional: Positive for malaise/fatigue.  Musculoskeletal: Positive for falls and neck pain.  Neurological: Positive for headaches.  Psychiatric/Behavioral: Positive for depression. The patient is nervous/anxious.   All other systems reviewed and are negative.    Blood pressure (!) 159/86, pulse 93, temperature 98.3 F (36.8 C), resp. rate 17, height 4\' 10"  (1.473 m), weight 92 kg, SpO2 95 %.Body mass index is 42.39 kg/m.  General Appearance: Casual  Eye Contact:  Fair  Speech:  Normal Rate  Volume:  Normal  Mood:  Anxious, Depressed and Irritable  Affect:  Blunt  Thought Process:  Coherent and Descriptions of Associations: Intact  Orientation:  Full (Time, Place, and Person)  Thought Content:  WDL and Logical  Suicidal Thoughts:  No  Homicidal Thoughts:  No  Memory:  Immediate;   Good Recent;   Fair Remote;   Good  Judgement:  Fair  Insight:  Fair  Psychomotor Activity:  Decreased  Concentration:  Concentration: Fair and Attention Span: Fair  Recall:  [place] of Knowledge:  Good  Language:  Good  Akathisia:  No  Handed:  Right  AIMS (if indicated):     Assets:  Housing Leisure Time Resilience Social Support  ADL's:  Impaired  Cognition:  WNL  Sleep:        Treatment Plan Summary: Daily contact with patient to assess and evaluate symptoms and progress in treatment, Medication management and Plan hallucinations, visual:  -Continue to monitor for return of hallucinations, none noted since last night  Major depressive disorder, recurrent, mild: -Continue Celexa 40 mg daily  Anxiety: -Continue Celexa 40 mg daily  Falls and Hyponatremia -Decrease her gabapentin 300 mg QID to 200 mg QID with a continue tapered off plan as this can cause hyponatremia and falls  Disposition: No evidence of imminent risk to self or others at present.   Patient does not meet criteria for psychiatric inpatient admission. Supportive therapy provided about ongoing stressors.  Waylan Boga, NP  04/27/2019 11:59 AM

## 2019-04-27 NOTE — ED Notes (Signed)
ED TO INPATIENT HANDOFF REPORT  ED Nurse Name and Phone #: Lelon Frohlich [phone]  S Name/Age/Gender [name] 73 y.o. female Room/Bed: ED24A/ED24A  Code Status   Code Status: Prior  Home/SNF/Other Home Patient oriented to: self Is this baseline? No   Triage Complete: Triage complete  Chief Complaint AMS  Triage Note Pt reports headache since she fell on 6/16; also hit her head at that time; was back here on 6/22 and 6/27 for the headache; CT was negative; pt says her headache feels slightly better; pt says her family brought her in tonight because she is having visual hallucinations and "talking out of her head"; when I asked pt if she remembers doing that "she says I guess I am. At least that's what they're telling me"; pt talking in complete coherent sentences; alert and oriented x 3;    Allergies Allergies  Allergen Reactions  . Adhesive [Tape] Other (See Comments)  . Other Rash    Plastic tape    Level of Care/Admitting Diagnosis ED Disposition    ED Disposition Condition [facility] Area: [place] [[id number]]  Level of Care: Med-Surg [16]  Covid Evaluation: Confirmed COVID Negative  Diagnosis: Hyponatremia [696789]  Admitting Physician: Harrie Foreman [[id number]]  Attending Physician: Harrie Foreman [[id number]]  Estimated length of stay: past midnight tomorrow  Certification:: I certify this patient will need inpatient services for at least 2 midnights  PT Class (Do Not Modify): Inpatient [101]  PT Acc Code (Do Not Modify): Private [1]       B Medical/Surgery History Past Medical History:  Diagnosis Date  . Allergy   . Asthma   . Back pain   . Bronchitis   . COPD (chronic obstructive pulmonary disease) ([place])   . Degenerative disc disease, lumbar   . Depression   . FHx: cholecystectomy   . GERD (gastroesophageal reflux disease)   . H/O: hysterectomy   . Headache   . Hypertension   . Murmur   . Osteoporosis   .  Pneumonia 03/31/16   being treated by PCP  . Restless legs syndrome   . Scoliosis   . Shingles   . Thyroid disease    Past Surgical History:  Procedure Laterality Date  . ABDOMINAL HYSTERECTOMY    . APPENDECTOMY    . BACK SURGERY    . CHOLECYSTECTOMY    . COLONOSCOPY WITH PROPOFOL N/A 01/16/2018   Procedure: COLONOSCOPY WITH PROPOFOL;  Surgeon: Toledo, Benay Pike, MD;  Location: [facility] ENDOSCOPY;  Service: Gastroenterology;  Laterality: N/A;  . ESOPHAGOGASTRODUODENOSCOPY (EGD) WITH PROPOFOL N/A 01/16/2018   Procedure: ESOPHAGOGASTRODUODENOSCOPY (EGD) WITH PROPOFOL;  Surgeon: Toledo, Benay Pike, MD;  Location: [facility] ENDOSCOPY;  Service: Gastroenterology;  Laterality: N/A;  . JOINT REPLACEMENT Left 1998   shoulder  . NECK SURGERY Bilateral   . ROTATOR CUFF REPAIR Right   . SHOULDER OPEN ROTATOR CUFF REPAIR Right   . SHOULDER SURGERY Left    replacement     A IV Location/Drains/Wounds Patient Lines/Drains/Airways Status   Active Line/Drains/Airways    Name:   Placement date:   Placement time:   Site:   Days:   Peripheral IV 04/27/19 Right;Upper Arm   04/27/19    0304    Arm   less than 1          Intake/Output Last 24 hours No intake or output data in the 24 hours ending 04/27/19 0543  Labs/Imaging Results for orders placed or performed  during the hospital encounter of 04/27/19 (from the past 48 hour(s))  Urinalysis, Complete w Microscopic     Status: Abnormal   Collection Time: 04/27/19 12:33 AM  Result Value Ref Range   Color, Urine YELLOW (A) YELLOW   APPearance CLEAR (A) CLEAR   Specific Gravity, Urine 1.014 1.005 - 1.030   pH 7.0 5.0 - 8.0   Glucose, UA NEGATIVE NEGATIVE mg/dL   Hgb urine dipstick NEGATIVE NEGATIVE   Bilirubin Urine NEGATIVE NEGATIVE   Ketones, ur 20 (A) NEGATIVE mg/dL   Protein, ur NEGATIVE NEGATIVE mg/dL   Nitrite NEGATIVE NEGATIVE   Leukocytes,Ua NEGATIVE NEGATIVE   RBC / HPF 0-5 0 - 5 RBC/hpf   WBC, UA 0-5 0 - 5 WBC/hpf   Bacteria, UA NONE  SEEN NONE SEEN   Squamous Epithelial / LPF 0-5 0 - 5   Mucus PRESENT     Comment: Performed at [facility], [address], [place], [place] [address]  SARS Coronavirus 2 (CEPHEID - Performed in [place] hospital lab), Hosp Order     Status: None   Collection Time: 04/27/19  3:19 AM   Specimen: Nasopharyngeal Swab  Result Value Ref Range   SARS Coronavirus 2 NEGATIVE NEGATIVE    Comment: (NOTE) If result is NEGATIVE SARS-CoV-2 target nucleic acids are NOT DETECTED. The SARS-CoV-2 RNA is generally detectable in upper and lower  respiratory specimens during the acute phase of infection. The lowest  concentration of SARS-CoV-2 viral copies this assay can detect is 250  copies / mL. A negative result does not preclude SARS-CoV-2 infection  and should not be used as the sole basis for treatment or other  patient management decisions.  A negative result may occur with  improper specimen collection / handling, submission of specimen other  than nasopharyngeal swab, presence of viral mutation(s) within the  areas targeted by this assay, and inadequate number of viral copies  (<250 copies / mL). A negative result must be combined with clinical  observations, patient history, and epidemiological information. If result is POSITIVE SARS-CoV-2 target nucleic acids are DETECTED. The SARS-CoV-2 RNA is generally detectable in upper and lower  respiratory specimens dur ing the acute phase of infection.  Positive  results are indicative of active infection with SARS-CoV-2.  Clinical  correlation with patient history and other diagnostic information is  necessary to determine patient infection status.  Positive results do  not rule out bacterial infection or co-infection with other viruses. If result is PRESUMPTIVE POSTIVE SARS-CoV-2 nucleic acids MAY BE PRESENT.   A presumptive positive result was obtained on the submitted specimen  and confirmed on repeat testing.  While 2019 novel  coronavirus  (SARS-CoV-2) nucleic acids may be present in the submitted sample  additional confirmatory testing may be necessary for epidemiological  and / or clinical management purposes  to differentiate between  SARS-CoV-2 and other Sarbecovirus currently known to infect humans.  If clinically indicated additional testing with an alternate test  methodology [phone]) is advised. The SARS-CoV-2 RNA is generally  detectable in upper and lower respiratory sp ecimens during the acute  phase of infection. The expected result is Negative. Fact Sheet for Patients:  StrictlyIdeas.no Fact Sheet for Healthcare Providers: BankingDealers.co.za This test is not yet approved or cleared by the [place] FDA and has been authorized for detection and/or diagnosis of SARS-CoV-2 by FDA under an Emergency Use Authorization (EUA).  This EUA will remain in effect (meaning this test can be used) for the  duration of the COVID-19 declaration under Section 564(b)(1) of the Act, 21 U.S.C. section 360bbb-3(b)(1), unless the authorization is terminated or revoked sooner. Performed at [facility], [address] [place], [place] [address]   Comprehensive metabolic panel     Status: Abnormal   Collection Time: 04/27/19  3:40 AM  Result Value Ref Range   Sodium 117 (LL) 135 - 145 mmol/L    Comment: CRITICAL RESULT CALLED TO, READ BACK BY AND VERIFIED WITH SONJIA WEAVER AT 4782 ON 04/27/19 RWW    Potassium 3.8 3.5 - 5.1 mmol/L   Chloride 83 (L) 98 - 111 mmol/L   CO2 23 22 - 32 mmol/L   Glucose, Bld 104 (H) 70 - 99 mg/dL   BUN 8 8 - 23 mg/dL   Creatinine, Ser 0.39 (L) 0.44 - 1.00 mg/dL   Calcium 8.8 (L) 8.9 - 10.3 mg/dL   Total Protein 7.3 6.5 - 8.1 g/dL   Albumin 4.0 3.5 - 5.0 g/dL   AST 24 15 - 41 U/L   ALT 18 0 - 44 U/L   Alkaline Phosphatase 78 38 - 126 U/L   Total Bilirubin 0.6 0.3 - 1.2 mg/dL   GFR calc non Af Amer >60 >60 mL/min   GFR  calc Af Amer >60 >60 mL/min   Anion gap 11 5 - 15    Comment: Performed at [facility], [address] [place], [place] [address]  CBC     Status: None   Collection Time: 04/27/19  3:40 AM  Result Value Ref Range   WBC 8.2 4.0 - 10.5 K/uL   RBC 4.44 3.87 - 5.11 MIL/uL   Hemoglobin 12.4 12.0 - 15.0 g/dL   HCT 36.3 36.0 - 46.0 %   MCV 81.8 80.0 - 100.0 fL   MCH 27.9 26.0 - 34.0 pg   MCHC 34.2 30.0 - 36.0 g/dL   RDW 13.6 11.5 - 15.5 %   Platelets 287 150 - 400 K/uL   nRBC 0.0 0.0 - 0.2 %    Comment: Performed at [facility], [address] [place], [place] [address]  Ethanol     Status: None   Collection Time: 04/27/19  3:40 AM  Result Value Ref Range   Alcohol, Ethyl (B) <10 <10 mg/dL    Comment: (NOTE) Lowest detectable limit for serum alcohol is 10 mg/dL. For medical purposes only. Performed at [facility], [place], [place] [address]   Troponin I (High Sensitivity)     Status: None   Collection Time: 04/27/19  3:40 AM  Result Value Ref Range   Troponin I (High Sensitivity) 2 <18 ng/L    Comment: (NOTE) Elevated high sensitivity troponin I (hsTnI) values and significant  changes across serial measurements may suggest ACS but many other  chronic and acute conditions are known to elevate hsTnI results.  Refer to the "Links" section for chest pain algorithms and additional  guidance. Performed at [facility], [address] [place], [place] [address]   Lactic acid, plasma     Status: None   Collection Time: 04/27/19  3:40 AM  Result Value Ref Range   Lactic Acid, Venous 1.0 0.5 - 1.9 mmol/L    Comment: Performed at [facility], [address] [place], [place] [address]  Acetaminophen level     Status: Abnormal   Collection Time: 04/27/19  3:40 AM  Result Value Ref Range   Acetaminophen (Tylenol), Serum <10 (L) 10 -  30 ug/mL    Comment: (NOTE) Therapeutic concentrations vary significantly. A range  of 10-30 ug/mL  may be an effective concentration for many patients. However, some  are best treated at concentrations outside of this range. Acetaminophen concentrations >150 ug/mL at 4 hours after ingestion  and >50 ug/mL at 12 hours after ingestion are often associated with  toxic reactions. Performed at [facility], [place], [place] [address]   Salicylate level     Status: None   Collection Time: 04/27/19  3:40 AM  Result Value Ref Range   Salicylate Lvl <8.1 2.8 - 30.0 mg/dL    Comment: Performed at [facility], [address] [place], [place] [address]   Ct Head Wo Contrast  Result Date: 04/27/2019 CLINICAL DATA:  Encephalopathy and headache EXAM: CT HEAD WITHOUT CONTRAST TECHNIQUE: Contiguous axial images were obtained from the base of the skull through the vertex without intravenous contrast. COMPARISON:  04/25/2019 FINDINGS: Brain: There is no mass, hemorrhage or extra-axial collection. There is generalized atrophy without lobar predilection. Hypodensity of the white matter is most commonly associated with chronic microvascular disease. Vascular: No abnormal hyperdensity of the major intracranial arteries or dural venous sinuses. No intracranial atherosclerosis. Skull: The visualized skull base, calvarium and extracranial soft tissues are normal. Sinuses/Orbits: No fluid levels or advanced mucosal thickening of the visualized paranasal sinuses. No mastoid or middle ear effusion. The orbits are normal. IMPRESSION: Chronic small vessel ischemia without acute intracranial abnormality. Electronically Signed   By: Ulyses Jarred M.D.   On: 04/27/2019 01:15   Ct Head Wo Contrast  Result Date: 04/25/2019 CLINICAL DATA:  [place] on Wednesday striking head, multiple recent falls, imbalance, headache since fall, denies loss of consciousness EXAM: CT HEAD WITHOUT CONTRAST CT CERVICAL SPINE WITHOUT CONTRAST TECHNIQUE: Multidetector CT imaging of the head and  cervical spine was performed following the standard protocol without intravenous contrast. Multiplanar CT image reconstructions of the cervical spine were also generated. Motion artifacts on CT head despite repeating images. COMPARISON:  04/21/2019 FINDINGS: CT HEAD FINDINGS Brain: Generalized atrophy. Normal ventricular morphology. No midline shift or mass effect. Small vessel chronic ischemic changes of deep cerebral white matter. No intracranial hemorrhage, mass lesion, evidence of acute infarction, or extra-axial fluid collection. Vascular: No hyperdense vessels. Mild atherosclerotic calcifications of internal carotid arteries at skull base Skull: Grossly intact Sinuses/Orbits: Chronic opacification and osseous thickening of LEFT maxillary sinus. Remaining sinuses and mastoid air cells clear. Other: N/A CT CERVICAL SPINE FINDINGS Alignment: Mild anterolisthesis C3-C4 unchanged. Remaining alignments normal. Skull base and vertebrae: Osseous demineralization. Multilevel facet degenerative changes. Prior anterior fusion of C4-C7. Disc space narrowing C3-C4, C7-T1. No acute fracture or bone destruction. Soft tissues and spinal canal: Fullness of prevertebral soft tissues anterior to the surgical plate, similar to prior study. Atherosclerotic calcifications in the carotid systems and at aortic arch. Disc levels: Portions of disc spaces and spinal canal are obscured by beam hardening artifacts from fixation hardware. Upper chest: Lung apices clear Other: N/A IMPRESSION: Atrophy with small vessel chronic ischemic changes of deep cerebral white matter. No acute cervical spine abnormalities. Scattered degenerative disc and facet disease changes of the cervical spine with postsurgical changes from prior C4-C7 ACDF. No acute cervical spine abnormalities. Electronically Signed   By: Lavonia Dana M.D.   On: 04/25/2019 13:02   Ct Cervical Spine Wo Contrast  Result Date: 04/25/2019 CLINICAL DATA:  [place] striking  head, multiple recent falls, imbalance, headache since fall, denies  loss of consciousness EXAM: CT HEAD WITHOUT CONTRAST CT CERVICAL SPINE WITHOUT CONTRAST TECHNIQUE: Multidetector CT imaging of the head and cervical spine was performed following the standard protocol without intravenous contrast. Multiplanar CT image reconstructions of the cervical spine were also generated. Motion artifacts on CT head despite repeating images. COMPARISON:  04/21/2019 FINDINGS: CT HEAD FINDINGS Brain: Generalized atrophy. Normal ventricular morphology. No midline shift or mass effect. Small vessel chronic ischemic changes of deep cerebral white matter. No intracranial hemorrhage, mass lesion, evidence of acute infarction, or extra-axial fluid collection. Vascular: No hyperdense vessels. Mild atherosclerotic calcifications of internal carotid arteries at skull base Skull: Grossly intact Sinuses/Orbits: Chronic opacification and osseous thickening of LEFT maxillary sinus. Remaining sinuses and mastoid air cells clear. Other: N/A CT CERVICAL SPINE FINDINGS Alignment: Mild anterolisthesis C3-C4 unchanged. Remaining alignments normal. Skull base and vertebrae: Osseous demineralization. Multilevel facet degenerative changes. Prior anterior fusion of C4-C7. Disc space narrowing C3-C4, C7-T1. No acute fracture or bone destruction. Soft tissues and spinal canal: Fullness of prevertebral soft tissues anterior to the surgical plate, similar to prior study. Atherosclerotic calcifications in the carotid systems and at aortic arch. Disc levels: Portions of disc spaces and spinal canal are obscured by beam hardening artifacts from fixation hardware. Upper chest: Lung apices clear Other: N/A IMPRESSION: Atrophy with small vessel chronic ischemic changes of deep cerebral white matter. No acute cervical spine abnormalities. Scattered degenerative disc and facet disease changes of the cervical spine with postsurgical changes from prior C4-C7 ACDF. No  acute cervical spine abnormalities. Electronically Signed   By: Lavonia Dana M.D.   On: 04/25/2019 13:02   Dg Chest Port 1 View  Result Date: 04/27/2019 CLINICAL DATA:  Confusion EXAM: PORTABLE CHEST 1 VIEW COMPARISON:  12/05/2016 FINDINGS: There are end-stage degenerative changes of the right shoulder. The patient is status post total shoulder arthroplasty on the left. The heart size is mildly enlarged. Say again noted is a moderate to large hiatal hernia. Unchanged short 80 cc there is no pneumothorax is as. No large pleural effusions. No focal infiltrate. IMPRESSION: No active disease. Electronically Signed   By: Constance Holster M.D.   On: 04/27/2019 03:03    Pending Labs Unresulted Labs (From admission, onward)    Start     Ordered   04/27/19 0600  Troponin I (High Sensitivity)  STAT Now then every 2 hours,   STAT     04/27/19 0434   04/26/19 2349  Lactic acid, plasma  Now then every 2 hours,   STAT     04/26/19 2349   Signed and Held  Creatinine, serum  (enoxaparin (LOVENOX)    CrCl >/= 30 ml/min)  Weekly,   R    Comments: while on enoxaparin therapy    Signed and Held   Signed and Held  TSH  Add-on,   R     Signed and Held   Signed and Held  Sodium  Now then every 8 hours,   R     Signed and Held   Signed and Held  Basic metabolic panel  Tomorrow morning,   R     Signed and Held   Signed and Held  Sodium  Now then every 12 hours,   R     Signed and Held          Vitals/Pain Today's Vitals   04/27/19 0117 04/27/19 0211 04/27/19 0212 04/27/19 0425  BP: (!) 156/105 (!) 163/101    Pulse: 73 72  Resp: (!) 22     Temp:      SpO2: 96% 100%    Weight:      Height:      PainSc:   10-Worst pain ever Asleep    Isolation Precautions No active isolations  Medications Medications  sodium chloride 0.9 % bolus 1,000 mL (1,000 mLs Intravenous New Bag/Given 04/27/19 0452)    Mobility walks with person assist High fall risk   Focused  Assessments NA   R Recommendations: See Admitting Provider Note  Report given to:   Additional Notes: na

## 2019-04-27 NOTE — Progress Notes (Signed)
[facility] at [place] NAME: [name]    MR#:  [id number]  DATE OF BIRTH:  [birth year]  SUBJECTIVE:  CHIEF COMPLAINT:   Chief Complaint  Patient presents with  . Headache  . Altered Mental Status   No new complaint this morning.  Patient was just admitted this morning for hyponatremia and altered mental status.  No seizures.  At the time of my evaluation patient was awake and alert and oriented x3.  Acute slightly restless but otherwise comfortable.  REVIEW OF SYSTEMS:  Review of Systems  Constitutional: Negative for chills and fever.  HENT: Negative for hearing loss and tinnitus.   Eyes: Negative for blurred vision and double vision.  Respiratory: Negative for cough and shortness of breath.   Cardiovascular: Negative for chest pain and palpitations.  Gastrointestinal: Negative for heartburn, nausea and vomiting.  Genitourinary: Negative for dysuria and urgency.  Musculoskeletal: Negative for myalgias and neck pain.  Skin: Negative for itching and rash.  Neurological: Negative for dizziness and headaches.  Psychiatric/Behavioral: Negative for depression.       Confusion appears improved    DRUG ALLERGIES:   Allergies  Allergen Reactions  . Adhesive [Tape] Other (See Comments)  . Other Rash    Plastic tape   VITALS:  Blood pressure (!) 159/86, pulse 93, temperature 98.3 F (36.8 C), resp. rate 17, height 4\' 10"  (1.473 m), weight 92 kg, SpO2 95 %. PHYSICAL EXAMINATION:   Physical Exam  Constitutional: She is oriented to person, place, and time. She appears well-developed and well-nourished.  HENT:  Head: Normocephalic and atraumatic.  Right Ear: External ear normal.  Eyes: Pupils are equal, round, and reactive to light. Conjunctivae are normal. Right eye exhibits no discharge.  Neck: Normal range of motion. Neck supple. No thyromegaly present.  Cardiovascular: Normal rate, regular rhythm and normal heart sounds.   Respiratory: Effort normal and breath sounds normal. She has no wheezes.  GI: Soft. Bowel sounds are normal. She exhibits no distension.  Musculoskeletal: Normal range of motion.        General: No edema.  Neurological: She is alert and oriented to person, place, and time.  Moving all extremities with no focal deficit  Skin: Skin is warm. She is not diaphoretic. No erythema.  Psychiatric: She has a normal mood and affect. Her behavior is normal.   LABORATORY PANEL:  Female CBC Recent Labs  Lab 04/27/19 0340  WBC 8.2  HGB 12.4  HCT 36.3  PLT 287   ------------------------------------------------------------------------------------------------------------------ Chemistries  Recent Labs  Lab 04/27/19 0340  NA 117*  K 3.8  CL 83*  CO2 23  GLUCOSE 104*  BUN 8  CREATININE 0.39*  CALCIUM 8.8*  AST 24  ALT 18  ALKPHOS 78  BILITOT 0.6   RADIOLOGY:  Ct Head Wo Contrast  Result Date: 04/27/2019 CLINICAL DATA:  Encephalopathy and headache EXAM: CT HEAD WITHOUT CONTRAST TECHNIQUE: Contiguous axial images were obtained from the base of the skull through the vertex without intravenous contrast. COMPARISON:  04/25/2019 FINDINGS: Brain: There is no mass, hemorrhage or extra-axial collection. There is generalized atrophy without lobar predilection. Hypodensity of the white matter is most commonly associated with chronic microvascular disease. Vascular: No abnormal hyperdensity of the major intracranial arteries or dural venous sinuses. No intracranial atherosclerosis. Skull: The visualized skull base, calvarium and extracranial soft tissues are normal. Sinuses/Orbits: No fluid levels or advanced mucosal thickening of the visualized paranasal sinuses. No mastoid or middle ear  effusion. The orbits are normal. IMPRESSION: Chronic small vessel ischemia without acute intracranial abnormality. Electronically Signed   By: Ulyses Jarred M.D.   On: 04/27/2019 01:15   Dg Chest Port 1 View  Result  Date: 04/27/2019 CLINICAL DATA:  Confusion EXAM: PORTABLE CHEST 1 VIEW COMPARISON:  12/05/2016 FINDINGS: There are end-stage degenerative changes of the right shoulder. The patient is status post total shoulder arthroplasty on the left. The heart size is mildly enlarged. Say again noted is a moderate to large hiatal hernia. Unchanged short 80 cc there is no pneumothorax is as. No large pleural effusions. No focal infiltrate. IMPRESSION: No active disease. Electronically Signed   By: Constance Holster M.D.   On: 04/27/2019 03:03   ASSESSMENT AND PLAN:   1.  Hyponatremia: No seizure. Reported to have intermittent confusion.  At the time of my evaluation patient awake and alert and oriented x3.  Slightly restless but otherwise comfortable in bed.  No focal deficits.  Sodium level was 117.  Patient started on normal saline already. Requested for repeat sodium level now.  Follow-up on serial sodium level every 4 hours to prevent rapid correction. Requested for hyponatremia work-up to include urine sodium, urine osmolality, serum osmolality, TSH and cortisol level CT scan of the head without acute findings.  Clinically no evidence of infectious process found.  2.  Hypertension: Intermittently controlled.  Continue metoprolol and amlodipine.  Placed on PRN IV hydralazine with parameters  3.  Hypothyroidism: TSH level normal.  continue Synthroid  4.  Falls: Fall precautions. CT of the head with no acute findings. PT and OT evaluation in a.m.     DVT prophylaxis: Lovenox   All the records are reviewed and case discussed with Care Management/Social Worker. Management plans discussed with the patient, family and they are in agreement.  CODE STATUS: Full Code  TOTAL TIME TAKING CARE OF THIS PATIENT: 34 minutes.   More than 50% of the time was spent in counseling/coordination of care: YES  POSSIBLE D/C IN 2 DAYS, DEPENDING ON CLINICAL CONDITION.   Asani Deniston M.D on 04/27/2019 at 10:47 AM   Between 7am to 6pm - Pager - [phone]  After 6pm go to [email or web address] - Proofreader  [facility]  Office  [phone]  CC: Primary care physician; Baxter Hire, MD  Note: This dictation was prepared with Dragon dictation along with smaller phrase technology. Any transcriptional errors that result from this process are unintentional.

## 2019-04-27 NOTE — Progress Notes (Signed)
CRITICAL VALUE ALERT  Critical Value:  118  Date & Time Notied:  04/27/2019 @ 1930  Provider Notified: Demetrios Loll MD  Orders Received/Actions taken: Continue NS, No new orders at this time

## 2019-04-27 NOTE — ED Notes (Signed)
Dr. Beather Arbour aware of sodium of 117.

## 2019-04-27 NOTE — ED Notes (Signed)
Pt given another warm blanket.  

## 2019-04-27 NOTE — ED Provider Notes (Signed)
[facility] Emergency Department Provider Note   ____________________________________________   First MD Initiated Contact with Patient 04/27/19 0205     (approximate)  I have reviewed the triage vital signs and the nursing notes.   HISTORY  Chief Complaint Headache and Altered Mental Status    HPI [name] is a 73 y.o. female brought to the ED from home by her family with a chief complaint of persistent headache and now visual hallucinations.  Patient had a fall with minor head injury approximately 12 days ago.  He was seen initially with negative CT head.  She has returned on 6/22 and 6/27 for persistent headaches.  Tonight her family brought her in under IVC because she is now hallucinating that her legs are green.  Patient denies any knowledge of this but states "I guess I am because that is what they are telling me".  Denies active SI/HI/AH.  States headache is generally better.  Family also reported that patient has been having more frequent falls and generalized weakness.       Past Medical History:  Diagnosis Date   Allergy    Asthma    Back pain    Bronchitis    COPD (chronic obstructive pulmonary disease) (HCC)    Degenerative disc disease, lumbar    Depression    FHx: cholecystectomy    GERD (gastroesophageal reflux disease)    H/O: hysterectomy    Headache    Hypertension    Murmur    Osteoporosis    Pneumonia 03/31/16   being treated by PCP   Restless legs syndrome    Scoliosis    Shingles    Thyroid disease     Patient Active Problem List   Diagnosis Date Noted   Iron deficiency anemia 02/01/2018   Acute exacerbation of chronic obstructive pulmonary disease (COPD) ([place]) 11/05/2016   Spinal stenosis, lumbar region, with neurogenic claudication 07/08/2015   Sacroiliac joint dysfunction 06/28/2015   Greater trochanteric bursitis 04/10/2015   Sacroiliac joint disease 03/14/2015   Facet syndrome,  lumbar 03/14/2015   HTN (hypertension) 03/06/2015   Thyroid disease 03/06/2015   DDD (degenerative disc disease), cervical 03/04/2015   DDD (degenerative disc disease), lumbar 03/04/2015   DJD (degenerative joint disease) of knee 03/04/2015    Past Surgical History:  Procedure Laterality Date   ABDOMINAL HYSTERECTOMY     APPENDECTOMY     BACK SURGERY     CHOLECYSTECTOMY     COLONOSCOPY WITH PROPOFOL N/A 01/16/2018   Procedure: COLONOSCOPY WITH PROPOFOL;  Surgeon: Toledo, Benay Pike, MD;  Location: [facility] ENDOSCOPY;  Service: Gastroenterology;  Laterality: N/A;   ESOPHAGOGASTRODUODENOSCOPY (EGD) WITH PROPOFOL N/A 01/16/2018   Procedure: ESOPHAGOGASTRODUODENOSCOPY (EGD) WITH PROPOFOL;  Surgeon: Toledo, Benay Pike, MD;  Location: [facility] ENDOSCOPY;  Service: Gastroenterology;  Laterality: N/A;   JOINT REPLACEMENT Left 1998   shoulder   NECK SURGERY Bilateral    ROTATOR CUFF REPAIR Right    SHOULDER OPEN ROTATOR CUFF REPAIR Right    SHOULDER SURGERY Left    replacement    Prior to Admission medications   Medication Sig Start Date End Date Taking? Authorizing Provider  albuterol (PROVENTIL HFA;VENTOLIN HFA) 108 (90 Base) MCG/ACT inhaler Inhale 1-2 puffs into the lungs every 6 (six) hours as needed for wheezing or shortness of breath.    [provider]  alendronate (FOSAMAX) 70 MG tablet Take 70 mg by mouth once a week. Take with a full glass of water on an empty stomach.  On Sunday    [provider]  amLODipine (NORVASC) 5 MG tablet Take 5 mg by mouth daily.    [provider]  Cholecalciferol (VITAMIN D) 2000 UNITS tablet Take 2,000 Units by mouth daily.    [provider]  citalopram (CELEXA) 40 MG tablet Take 40 mg by mouth daily.     [provider]  gabapentin (NEURONTIN) 300 MG capsule Take 300 mg by mouth 4 (four) times daily.    [provider]  ibuprofen (ADVIL,MOTRIN) 200 MG tablet Take 400-800 mg by mouth every 6  (six) hours as needed for mild pain or moderate pain.    [provider]  levothyroxine (SYNTHROID, LEVOTHROID) 112 MCG tablet Take 112 mcg by mouth daily before breakfast.    [provider]  metoprolol (LOPRESSOR) 100 MG tablet Take 100 mg by mouth 2 (two) times daily.    [provider]  Multiple Vitamin (MULTIVITAMIN) tablet Take 1 tablet by mouth daily. Reported on 04/05/2016    [provider]  omeprazole (PRILOSEC) 20 MG capsule Take 20 mg by mouth 2 (two) times daily before a meal.     [provider]  potassium chloride SA (K-DUR,KLOR-CON) 20 MEQ tablet Take 20 mEq by mouth daily.    [provider]  predniSONE (DELTASONE) 20 MG tablet Take 2 tablets (40 mg total) by mouth daily. 01/08/19   Harvest Dark, MD  tiotropium (SPIRIVA HANDIHALER) 18 MCG inhalation capsule Place 1 capsule (18 mcg total) into inhaler and inhale daily. 11/14/17   Laverle Hobby, MD  traMADol (ULTRAM) 50 MG tablet Take 1 tablet (50 mg total) by mouth every 6 (six) hours as needed for moderate pain or severe pain. 04/21/19   Delman Kitten, MD    Allergies Adhesive [tape] and Other  Family History  Problem Relation Age of Onset   Kidney disease Father    Heart disease Mother    Diabetes Mother     Social History Social History   Tobacco Use   Smoking status: Former Smoker    Packs/day: 1.00    Years: 30.00    Pack years: 30.00    Types: Cigarettes    Quit date: 11/04/2016    Years since quitting: 2.4   Smokeless tobacco: Never Used  Substance Use Topics   Alcohol use: No    Alcohol/week: 0.0 standard drinks   Drug use: No    Review of Systems  Constitutional: Positive for generalized weakness.  No fever/chills Eyes: No visual changes. ENT: No sore throat. Cardiovascular: Denies chest pain. Respiratory: Denies shortness of breath. Gastrointestinal: No abdominal pain.  No nausea, no vomiting.  No diarrhea.  No  constipation. Genitourinary: Negative for dysuria. Musculoskeletal: Negative for back pain. Skin: Negative for rash. Neurological: Negative for headaches, focal weakness or numbness. Psychiatric: Positive for hallucinations.  ____________________________________________   PHYSICAL EXAM:  VITAL SIGNS: ED Triage Vitals  Enc Vitals Group     BP 04/26/19 2343 (!) 154/118     Pulse Rate 04/26/19 2343 86     Resp 04/26/19 2343 (!) 22     Temp 04/26/19 2343 98.3 F (36.8 C)     Temp src --      SpO2 04/26/19 2343 97 %     Weight 04/26/19 2345 202 lb 13.2 oz (92 kg)     Height 04/26/19 2345 4\' 10"  (1.473 m)     Head Circumference --      Peak Flow --  Pain Score 04/26/19 2344 10     Pain Loc --      Pain Edu? --      Excl. in [place]? --     Constitutional: Asleep, awakened for exam. Alert and oriented. Fatigued appearing and in no acute distress. Eyes: Conjunctivae are normal. PERRL. EOMI. Head: Atraumatic. Nose: No congestion/rhinnorhea. Mouth/Throat: Mucous membranes are moist.  Oropharynx non-erythematous. Neck: No stridor.   Cardiovascular: Normal rate, regular rhythm. Grossly normal heart sounds.  Good peripheral circulation. Respiratory: Normal respiratory effort.  No retractions. Lungs CTAB. Gastrointestinal: Soft and nontender. No distention. No abdominal bruits. No CVA tenderness. Musculoskeletal: No lower extremity tenderness nor edema.  No joint effusions. Neurologic:  Normal speech and language. No gross focal neurologic deficits are appreciated.  Skin:  Skin is warm, dry and intact. No rash noted. Psychiatric: Mood and affect are normal. Speech and behavior are normal.  ____________________________________________   LABS (all labs ordered are listed, but only abnormal results are displayed)  Labs Reviewed  COMPREHENSIVE METABOLIC PANEL - Abnormal; Notable for the following components:      Result Value   Sodium 117 (*)    Chloride 83 (*)    Glucose, Bld  104 (*)    Creatinine, Ser 0.39 (*)    Calcium 8.8 (*)    All other components within normal limits  URINALYSIS, COMPLETE (UACMP) WITH MICROSCOPIC - Abnormal; Notable for the following components:   Color, Urine YELLOW (*)    APPearance CLEAR (*)    Ketones, ur 20 (*)    All other components within normal limits  ACETAMINOPHEN LEVEL - Abnormal; Notable for the following components:   Acetaminophen (Tylenol), Serum <10 (*)    All other components within normal limits  SARS CORONAVIRUS 2 (HOSPITAL ORDER, [facility])  CBC  ETHANOL  TROPONIN I (HIGH SENSITIVITY)  LACTIC ACID, PLASMA  SALICYLATE LEVEL  LACTIC ACID, PLASMA  TROPONIN I (HIGH SENSITIVITY)  TROPONIN I (HIGH SENSITIVITY)  CBG MONITORING, ED   ____________________________________________  EKG  ED ECG REPORT I, Chenel Wernli J, the attending physician, personally viewed and interpreted this ECG.   Date: 04/27/2019  EKG Time: 0010  Rate: 74  Rhythm: normal EKG, normal sinus rhythm  Axis: LAD  Intervals:none  ST&T Change: Nonspecific  ____________________________________________  RADIOLOGY  ED MD interpretation: No ICH, no acute cardiopulmonary process  Official radiology report(s): Ct Head Wo Contrast  Result Date: 04/27/2019 CLINICAL DATA:  Encephalopathy and headache EXAM: CT HEAD WITHOUT CONTRAST TECHNIQUE: Contiguous axial images were obtained from the base of the skull through the vertex without intravenous contrast. COMPARISON:  04/25/2019 FINDINGS: Brain: There is no mass, hemorrhage or extra-axial collection. There is generalized atrophy without lobar predilection. Hypodensity of the white matter is most commonly associated with chronic microvascular disease. Vascular: No abnormal hyperdensity of the major intracranial arteries or dural venous sinuses. No intracranial atherosclerosis. Skull: The visualized skull base, calvarium and extracranial soft tissues are normal. Sinuses/Orbits:  No fluid levels or advanced mucosal thickening of the visualized paranasal sinuses. No mastoid or middle ear effusion. The orbits are normal. IMPRESSION: Chronic small vessel ischemia without acute intracranial abnormality. Electronically Signed   By: Ulyses Jarred M.D.   On: 04/27/2019 01:15   Dg Chest Port 1 View  Result Date: 04/27/2019 CLINICAL DATA:  Confusion EXAM: PORTABLE CHEST 1 VIEW COMPARISON:  12/05/2016 FINDINGS: There are end-stage degenerative changes of the right shoulder. The patient is status post total shoulder arthroplasty on the left. The  heart size is mildly enlarged. Say again noted is a moderate to large hiatal hernia. Unchanged short 80 cc there is no pneumothorax is as. No large pleural effusions. No focal infiltrate. IMPRESSION: No active disease. Electronically Signed   By: Constance Holster M.D.   On: 04/27/2019 03:03    ____________________________________________   PROCEDURES  Procedure(s) performed (including Critical Care):  Procedures   ____________________________________________   INITIAL IMPRESSION / ASSESSMENT AND PLAN / ED COURSE  As part of my medical decision making, I reviewed the following data within the [facility] History obtained from family, Nursing notes reviewed and incorporated, Labs reviewed, EKG interpreted, Old chart reviewed, Radiograph reviewed, Discussed with admitting physician, A consult was requested and obtained from this/these consultant(s) Psychiatry and Notes from prior ED visits     HAJER DWYER was evaluated in Emergency Department on 04/27/2019 for the symptoms described in the history of present illness. She was evaluated in the context of the global COVID-19 pandemic, which necessitated consideration that the patient might be at risk for infection with the SARS-CoV-2 virus that causes COVID-19. Institutional protocols and algorithms that pertain to the evaluation of patients at risk for COVID-19 are in a  state of rapid change based on information released by regulatory bodies including the CDC and federal and state organizations. These policies and algorithms were followed during the patient's care in the ED.   73 year old female with a history of recent multiple and recurrent falls, increasing generalized weakness, and now under IVC by family for visual hallucinations.  Differential diagnosis includes but is not limited to [place], infectious, cardiac, metabolic etiologies, etc.  Will obtain screening lab work, imaging studies.  Given patient's generalized weakness with recent multiple falls and I see from the previous record that she has new hyponatremia, anticipate hospitalization with inpatient psychiatric consult.  Clinical Course as of Apr 26 441  Sun Apr 27, 2019  0440 Noted sodium 119.  Will initiate IV fluid resuscitation.  Discussed case with hospitalist Dr. Marcille Blanco who will evaluate patient in the emergency department for admission.  Since patient is IVC she will require sitter.  Have placed inpatient psychiatric consultation.  Family was updated by nursing staff.   [JS]    Clinical Course User Index [JS] Paulette Blanch, MD     ____________________________________________   FINAL CLINICAL IMPRESSION(S) / ED DIAGNOSES  Final diagnoses:  Dehydration  Weakness  Hallucinations  Frequent falls  Hyponatremia     ED Discharge Orders    None       Note:  This document was prepared using Dragon voice recognition software and may include unintentional dictation errors.   Paulette Blanch, MD 04/27/19 0630

## 2019-04-27 NOTE — ED Notes (Signed)
Pt c/o HA; pt denies wanting to hurt self or others; pt denies hearing or seeing anything unusual; pt currently A&Ox4.

## 2019-04-27 NOTE — ED Notes (Signed)
2 attempts at IV access by this RN unsuccessful; pt tolerated well

## 2019-04-27 NOTE — ED Notes (Signed)
Report called to patient's granddaughter, Meenakshi Sazama at [phone], who is the person who took out the IVC on the patient. [facility] was informed per Dr. Asher Muir request that patient is a medical admit and will see psychiatry while admitted. [facility] expressed appreciation for care.

## 2019-04-27 NOTE — ED Notes (Signed)
Called Lab to request for blood drawal, 2 attempts.AS

## 2019-04-27 NOTE — ED Notes (Signed)
IV team at bedside 

## 2019-04-27 NOTE — ED Notes (Signed)
IV team unable to obtain blood with IV stick. Lab called for blood draw.

## 2019-04-27 NOTE — ED Notes (Signed)
Patient is having difficulty understanding how to use the Purewick. Patient encouraged to void and was assured we could clean up any "messes."

## 2019-04-28 ENCOUNTER — Inpatient Hospital Stay: Payer: Self-pay

## 2019-04-28 DIAGNOSIS — R441 Visual hallucinations: Secondary | ICD-10-CM

## 2019-04-28 LAB — SODIUM, URINE, RANDOM: Sodium, Ur: 98 mmol/L

## 2019-04-28 LAB — BASIC METABOLIC PANEL
Anion gap: 9 (ref 5–15)
BUN: 6 mg/dL — ABNORMAL LOW (ref 8–23)
CO2: 26 mmol/L (ref 22–32)
Calcium: 8.4 mg/dL — ABNORMAL LOW (ref 8.9–10.3)
Chloride: 84 mmol/L — ABNORMAL LOW (ref 98–111)
Creatinine, Ser: 0.39 mg/dL — ABNORMAL LOW (ref 0.44–1.00)
GFR calc Af Amer: >60 mL/min (ref >60)
GFR calc non Af Amer: >60 mL/min (ref >60)
Glucose, Bld: 98 mg/dL (ref 70–99)
Potassium: 3.2 mmol/L — ABNORMAL LOW (ref 3.5–5.1)
Sodium: 119 mmol/L — CL (ref 135–145)

## 2019-04-28 LAB — SODIUM
Sodium: 117 mmol/L — CL (ref 135–145)
Sodium: 116 mmol/L — CL (ref 135–145)
Sodium: 118 mmol/L — CL (ref 135–145)
Sodium: 117 mmol/L — CL (ref 135–145)

## 2019-04-28 LAB — OSMOLALITY: Osmolality: 241 mOsm/kg — CL (ref 275–295)

## 2019-04-28 LAB — CBC
HCT: 34.9 % — ABNORMAL LOW (ref 36.0–46.0)
Hemoglobin: 12 g/dL (ref 12.0–15.0)
MCH: 28 pg (ref 26.0–34.0)
MCHC: 34.4 g/dL (ref 30.0–36.0)
MCV: 81.4 fL (ref 80.0–100.0)
Platelets: 293 10*3/uL (ref 150–400)
RBC: 4.29 MIL/uL (ref 3.87–5.11)
RDW: 13.6 % (ref 11.5–15.5)
WBC: 9.1 10*3/uL (ref 4.0–10.5)
nRBC: 0 % (ref 0.0–0.2)

## 2019-04-28 LAB — MAGNESIUM: Magnesium: 1.9 mg/dL (ref 1.7–2.4)

## 2019-04-28 MED ORDER — BUTALBITAL-APAP-CAFFEINE 50-325-40 MG PO TABS
1.0000 | ORAL_TABLET | Freq: Four times a day (QID) | ORAL | Status: DC | PRN
  Administered 2019-04-28 – 2019-04-29 (×3): 1 via ORAL
  Filled 2019-04-28 (×4): qty 1

## 2019-04-28 MED ORDER — POTASSIUM CHLORIDE CRYS ER 20 MEQ PO TBCR: 20.0000 meq | EXTENDED_RELEASE_TABLET | Freq: Every day | ORAL | Status: DC

## 2019-04-28 MED ORDER — SODIUM CHLORIDE 3 % IV SOLN
INTRAVENOUS | Status: DC
  Filled 2019-04-28 (×3): qty 500

## 2019-04-28 MED ORDER — POTASSIUM CHLORIDE CRYS ER 20 MEQ PO TBCR
40.0000 meq | EXTENDED_RELEASE_TABLET | ORAL | Status: AC
  Administered 2019-04-28 (×2): 40 meq via ORAL
  Filled 2019-04-28 (×2): qty 2

## 2019-04-28 NOTE — Consult Note (Signed)
[facility] CONSULT NOTE    Date: 04/28/2019                  Patient Name:  [name]  MRN: [id number]  DOB: [birth year]  Age / Sex: 73 y.o., female         PCP: Baxter Hire, MD                 Service Requesting Consult: Hospitalist                 Reason for Consult: Hyponatremia            History of Present Illness: Patient is a 73 y.o. female with a PMHx of COPD, depression, scoliosis, restless leg syndrome, GERD, degenertive disc disease, hypertension, osteoporosis, who was admitted to [facility] on 04/27/2019 for evaluation of altered mental status.  Patient is a poor historian and unable to offer significant history.  She does state that her family felt she was confused therefore she was brought in.  Upon evaluation here she was found to have a very low sodium of 117.  Despite IV fluid hydration this remains low at the moment.  CT scan of the head was performed and was negative.  She has longstanding history of COPD and pulmonary disease.  Therefore SIADH is a consideration.  TSH was checked and was found to be 1.5.  Cortisol was also checked and was 37.2.   Medications: Outpatient medications: Medications Prior to Admission  Medication Sig Dispense Refill Last Dose  . albuterol (PROVENTIL HFA;VENTOLIN HFA) 108 (90 Base) MCG/ACT inhaler Inhale 1-2 puffs into the lungs every 6 (six) hours as needed for wheezing or shortness of breath.     Marland Kitchen alendronate (FOSAMAX) 70 MG tablet Take 70 mg by mouth once a week. Take with a full glass of water on an empty stomach. On Sunday     . amLODipine (NORVASC) 5 MG tablet Take 5 mg by mouth daily.     . Cholecalciferol (VITAMIN D) 2000 UNITS tablet Take 2,000 Units by mouth daily.     . citalopram (CELEXA) 40 MG tablet Take 40 mg by mouth daily.      Marland Kitchen gabapentin (NEURONTIN) 300 MG capsule Take 300 mg by mouth 4 (four) times daily.     Marland Kitchen ibuprofen (ADVIL,MOTRIN) 200 MG tablet Take 400-800 mg by mouth every 6 (six)  hours as needed for mild pain or moderate pain.     Marland Kitchen levothyroxine (SYNTHROID, LEVOTHROID) 112 MCG tablet Take 112 mcg by mouth daily before breakfast.     . metoprolol (LOPRESSOR) 100 MG tablet Take 100 mg by mouth 2 (two) times daily.     . Multiple Vitamin (MULTIVITAMIN) tablet Take 1 tablet by mouth daily. Reported on 04/05/2016     . omeprazole (PRILOSEC) 20 MG capsule Take 20 mg by mouth 2 (two) times daily before a meal.      . potassium chloride SA (K-DUR,KLOR-CON) 20 MEQ tablet Take 20 mEq by mouth daily.     . predniSONE (DELTASONE) 20 MG tablet Take 2 tablets (40 mg total) by mouth daily. 10 tablet 0   . tiotropium (SPIRIVA HANDIHALER) 18 MCG inhalation capsule Place 1 capsule (18 mcg total) into inhaler and inhale daily. 30 capsule 12   . traMADol (ULTRAM) 50 MG tablet Take 1 tablet (50 mg total) by mouth every 6 (six) hours as needed for moderate pain or severe pain. 12 tablet 0  Current medications: Current Facility-Administered Medications  Medication Dose Route Frequency Provider Last Rate Last Dose  . 0.9 %  sodium chloride infusion   Intravenous Continuous Ojie, Jude, MD 75 mL/hr at 04/28/19 1000    . acetaminophen (TYLENOL) tablet 650 mg  650 mg Oral Q6H PRN Harrie Foreman, MD   650 mg at 04/28/19 0116   Or  . acetaminophen (TYLENOL) suppository 650 mg  650 mg Rectal Q6H PRN Harrie Foreman, MD      . albuterol (PROVENTIL) (2.5 MG/3ML) 0.083% nebulizer solution 2.5 mg  2.5 mg Nebulization Q4H PRN Harrie Foreman, MD   2.5 mg at 04/28/19 0931  . amLODipine (NORVASC) tablet 5 mg  5 mg Oral Daily Harrie Foreman, MD   5 mg at 04/28/19 1610  . butalbital-acetaminophen-caffeine (FIORICET) 50-325-40 MG per tablet 1 tablet  1 tablet Oral Q6H PRN Harrie Foreman, MD   1 tablet at 04/28/19 0349  . cholecalciferol (VITAMIN D) tablet 2,000 Units  2,000 Units Oral Daily Harrie Foreman, MD   2,000 Units at 04/28/19 [phone]  . citalopram (CELEXA) tablet 40 mg  40 mg Oral  Daily Harrie Foreman, MD   40 mg at 04/28/19 0916  . docusate sodium (COLACE) capsule 100 mg  100 mg Oral BID Harrie Foreman, MD   100 mg at 04/27/19 2104  . enoxaparin (LOVENOX) injection 40 mg  40 mg Subcutaneous Q12H Harrie Foreman, MD   40 mg at 04/28/19 0915  . gabapentin (NEURONTIN) capsule 200 mg  200 mg Oral QID Patrecia Pour, NP   200 mg at 04/28/19 5409  . hydrALAZINE (APRESOLINE) injection 10 mg  10 mg Intravenous Q6H PRN Ojie, Jude, MD      . levothyroxine (SYNTHROID) tablet 112 mcg  112 mcg Oral QAC breakfast Harrie Foreman, MD   112 mcg at 04/28/19 0522  . metoprolol tartrate (LOPRESSOR) tablet 100 mg  100 mg Oral BID Harrie Foreman, MD   100 mg at 04/28/19 8119  . multivitamin with minerals tablet 1 tablet  1 tablet Oral Daily Harrie Foreman, MD   1 tablet at 04/28/19 [id number]  . ondansetron (ZOFRAN) tablet 4 mg  4 mg Oral Q6H PRN Harrie Foreman, MD       Or  . ondansetron [facility]) injection 4 mg  4 mg Intravenous Q6H PRN Harrie Foreman, MD      . pantoprazole (PROTONIX) EC tablet 40 mg  40 mg Oral Daily Harrie Foreman, MD   40 mg at 04/28/19 0916  . [START ON 04/29/2019] potassium chloride SA (K-DUR) CR tablet 20 mEq  20 mEq Oral Daily Harrie Foreman, MD      . predniSONE (DELTASONE) tablet 40 mg  40 mg Oral Q breakfast Harrie Foreman, MD   40 mg at 04/28/19 0917  . tiotropium (SPIRIVA) inhalation capsule ([facility] use ONLY) 18 mcg  18 mcg Inhalation Daily Harrie Foreman, MD   18 mcg at 04/28/19 2956      Allergies: Allergies  Allergen Reactions  . Adhesive [Tape] Other (See Comments)  . Other Rash    Plastic tape      Past Medical History: Past Medical History:  Diagnosis Date  . Allergy   . Asthma   . Back pain   . Bronchitis   . COPD (chronic obstructive pulmonary disease) ([place])   . Degenerative disc disease, lumbar   . Depression   . FHx: cholecystectomy   .  GERD (gastroesophageal reflux disease)   . H/O: hysterectomy    . Headache   . Hypertension   . Murmur   . Osteoporosis   . Pneumonia 03/31/16   being treated by PCP  . Restless legs syndrome   . Scoliosis   . Shingles   . Thyroid disease      Past Surgical History: Past Surgical History:  Procedure Laterality Date  . ABDOMINAL HYSTERECTOMY    . APPENDECTOMY    . BACK SURGERY    . CHOLECYSTECTOMY    . COLONOSCOPY WITH PROPOFOL N/A 01/16/2018   Procedure: COLONOSCOPY WITH PROPOFOL;  Surgeon: Toledo, Benay Pike, MD;  Location: [facility] ENDOSCOPY;  Service: Gastroenterology;  Laterality: N/A;  . ESOPHAGOGASTRODUODENOSCOPY (EGD) WITH PROPOFOL N/A 01/16/2018   Procedure: ESOPHAGOGASTRODUODENOSCOPY (EGD) WITH PROPOFOL;  Surgeon: Toledo, Benay Pike, MD;  Location: [facility] ENDOSCOPY;  Service: Gastroenterology;  Laterality: N/A;  . JOINT REPLACEMENT Left 1998   shoulder  . NECK SURGERY Bilateral   . ROTATOR CUFF REPAIR Right   . SHOULDER OPEN ROTATOR CUFF REPAIR Right   . SHOULDER SURGERY Left    replacement     Family History: Family History  Problem Relation Age of Onset  . Kidney disease Father   . Heart disease Mother   . Diabetes Mother      Social History: Social History   Socioeconomic History  . Marital status: Married    Spouse name: Not on file  . Number of children: Not on file  . Years of education: Not on file  . Highest education level: Not on file  Occupational History  . Not on file  Social Needs  . Financial resource strain: Not on file  . Food insecurity    Worry: Not on file    Inability: Not on file  . Transportation needs    Medical: Not on file    Non-medical: Not on file  Tobacco Use  . Smoking status: Former Smoker    Packs/day: 1.00    Years: 30.00    Pack years: 30.00    Types: Cigarettes    Quit date: 11/04/2016    Years since quitting: 2.4  . Smokeless tobacco: Never Used  Substance and Sexual Activity  . Alcohol use: No    Alcohol/week: 0.0 standard drinks  . Drug use: No  . Sexual activity:  Not on file  Lifestyle  . Physical activity    Days per week: Not on file    Minutes per session: Not on file  . Stress: Not on file  Relationships  . Social Herbalist on phone: Not on file    Gets together: Not on file    Attends religious service: Not on file    Active member of club or organization: Not on file    Attends meetings of clubs or organizations: Not on file    Relationship status: Not on file  . Intimate partner violence    Fear of current or ex partner: Not on file    Emotionally abused: Not on file    Physically abused: Not on file    Forced sexual activity: Not on file  Other Topics Concern  . Not on file  Social History Narrative  . Not on file     Review of Systems: Unable to provide secondary to altered mental status  Vital Signs: Blood pressure (!) 154/97, pulse 76, temperature 98.8 F (37.1 C), resp. rate 17, height 4\' 10"  (1.473 m), weight 94.5 kg,  SpO2 97 %.  Weight trends: Filed Weights   04/26/19 2345 04/28/19 0500  Weight: 92 kg 94.5 kg    Physical Exam: General: NAD, laying in bed  Head: Normocephalic, atraumatic.  Eyes: Anicteric, EOMI  Nose: Mucous membranes moist, not inflammed, nonerythematous.  Throat: Oropharynx nonerythematous, no exudate appreciated.   Neck: Supple, trachea midline.  Lungs:   Bilateral wheezing, normal effort  Heart: RRR. S1 and S2 normal without gallop, murmur, or rubs.  Abdomen:  BS normoactive. Soft, Nondistended, non-tender.  No masses or organomegaly.  Extremities: No pretibial edema.  Neurologic: Awake but confused  Skin: No visible rashes, scars.    Lab results: Basic Metabolic Panel: Recent Labs  Lab 04/25/19 1149 04/27/19 0340  04/27/19 1848 04/28/19 0337 04/28/19 1345  NA 126* 117*   < > 118* 119* 117*  K 5.1 3.8  --   --  3.2*  --   CL 92* 83*  --   --  84*  --   CO2 26 23  --   --  26  --   GLUCOSE 105* 104*  --   --  98  --   BUN 15 8  --   --  6*  --   CREATININE 0.69  0.39*  --   --  0.39*  --   CALCIUM 9.2 8.8*  --   --  8.4*  --   MG  --   --   --   --  1.9  --    < > = values in this interval not displayed.    Liver Function Tests: Recent Labs  Lab 04/25/19 1149 04/27/19 0340  AST 22 24  ALT 15 18  ALKPHOS 66 78  BILITOT 0.5 0.6  PROT 7.2 7.3  ALBUMIN 3.8 4.0   No results for input(s): LIPASE, AMYLASE in the last 168 hours. No results for input(s): AMMONIA in the last 168 hours.  CBC: Recent Labs  Lab 04/25/19 1149 04/27/19 0340 04/28/19 0337  WBC 10.9* 8.2 9.1  HGB 12.6 12.4 12.0  HCT 39.3 36.3 34.9*  MCV 86.6 81.8 81.4  PLT 305 287 293    Cardiac Enzymes: No results for input(s): CKTOTAL, CKMB, CKMBINDEX, TROPONINI in the last 168 hours.  BNP: Invalid input(s): POCBNP  CBG: No results for input(s): GLUCAP in the last 168 hours.  Microbiology: Results for orders placed or performed during the hospital encounter of 04/27/19  SARS Coronavirus 2 (CEPHEID - Performed in [place] hospital lab), Hosp Order     Status: None   Collection Time: 04/27/19  3:19 AM   Specimen: Nasopharyngeal Swab  Result Value Ref Range Status   SARS Coronavirus 2 NEGATIVE NEGATIVE Final    Comment: (NOTE) If result is NEGATIVE SARS-CoV-2 target nucleic acids are NOT DETECTED. The SARS-CoV-2 RNA is generally detectable in upper and lower  respiratory specimens during the acute phase of infection. The lowest  concentration of SARS-CoV-2 viral copies this assay can detect is 250  copies / mL. A negative result does not preclude SARS-CoV-2 infection  and should not be used as the sole basis for treatment or other  patient management decisions.  A negative result may occur with  improper specimen collection / handling, submission of specimen other  than nasopharyngeal swab, presence of viral mutation(s) within the  areas targeted by this assay, and inadequate number of viral copies  (<250 copies / mL). A negative result must be combined with  clinical  observations, patient history,  and epidemiological information. If result is POSITIVE SARS-CoV-2 target nucleic acids are DETECTED. The SARS-CoV-2 RNA is generally detectable in upper and lower  respiratory specimens dur ing the acute phase of infection.  Positive  results are indicative of active infection with SARS-CoV-2.  Clinical  correlation with patient history and other diagnostic information is  necessary to determine patient infection status.  Positive results do  not rule out bacterial infection or co-infection with other viruses. If result is PRESUMPTIVE POSTIVE SARS-CoV-2 nucleic acids MAY BE PRESENT.   A presumptive positive result was obtained on the submitted specimen  and confirmed on repeat testing.  While 2019 novel coronavirus  (SARS-CoV-2) nucleic acids may be present in the submitted sample  additional confirmatory testing may be necessary for epidemiological  and / or clinical management purposes  to differentiate between  SARS-CoV-2 and other Sarbecovirus currently known to infect humans.  If clinically indicated additional testing with an alternate test  methodology [phone]) is advised. The SARS-CoV-2 RNA is generally  detectable in upper and lower respiratory sp ecimens during the acute  phase of infection. The expected result is Negative. Fact Sheet for Patients:  StrictlyIdeas.no Fact Sheet for Healthcare Providers: BankingDealers.co.za This test is not yet approved or cleared by the [place] FDA and has been authorized for detection and/or diagnosis of SARS-CoV-2 by FDA under an Emergency Use Authorization (EUA).  This EUA will remain in effect (meaning this test can be used) for the duration of the COVID-19 declaration under Section 564(b)(1) of the Act, 21 U.S.C. section 360bbb-3(b)(1), unless the authorization is terminated or revoked sooner. Performed at [facility], [address] [place], [place] [address]     Coagulation Studies: No results for input(s): LABPROT, INR in the last 72 hours.  Urinalysis: Recent Labs    04/27/19 0033  COLORURINE YELLOW*  LABSPEC 1.014  PHURINE 7.0  GLUCOSEU NEGATIVE  HGBUR NEGATIVE  BILIRUBINUR NEGATIVE  KETONESUR 20*  PROTEINUR NEGATIVE  NITRITE NEGATIVE  LEUKOCYTESUR NEGATIVE      Imaging: Ct Head Wo Contrast  Result Date: 04/27/2019 CLINICAL DATA:  Encephalopathy and headache EXAM: CT HEAD WITHOUT CONTRAST TECHNIQUE: Contiguous axial images were obtained from the base of the skull through the vertex without intravenous contrast. COMPARISON:  04/25/2019 FINDINGS: Brain: There is no mass, hemorrhage or extra-axial collection. There is generalized atrophy without lobar predilection. Hypodensity of the white matter is most commonly associated with chronic microvascular disease. Vascular: No abnormal hyperdensity of the major intracranial arteries or dural venous sinuses. No intracranial atherosclerosis. Skull: The visualized skull base, calvarium and extracranial soft tissues are normal. Sinuses/Orbits: No fluid levels or advanced mucosal thickening of the visualized paranasal sinuses. No mastoid or middle ear effusion. The orbits are normal. IMPRESSION: Chronic small vessel ischemia without acute intracranial abnormality. Electronically Signed   By: Ulyses Jarred M.D.   On: 04/27/2019 01:15   Dg Chest Port 1 View  Result Date: 04/27/2019 CLINICAL DATA:  Confusion EXAM: PORTABLE CHEST 1 VIEW COMPARISON:  12/05/2016 FINDINGS: There are end-stage degenerative changes of the right shoulder. The patient is status post total shoulder arthroplasty on the left. The heart size is mildly enlarged. Say again noted is a moderate to large hiatal hernia. Unchanged short 80 cc there is no pneumothorax is as. No large pleural effusions. No focal infiltrate. IMPRESSION: No active disease. Electronically Signed   By: Constance Holster M.D.    On: 04/27/2019 03:03      Assessment & Plan: Pt is  a 73 y.o. female with a PMHx of COPD, depression, scoliosis, restless leg syndrome, GERD, degenertive disc disease, hypertension, osteoporosis, who was admitted to [facility] on 04/27/2019 for evaluation of altered mental status.   1.  Hyponatremia. 2.  Altered mental status. 3.  COPD.    Plan:  We are asked to see the patient for evaluation management of hyponatremia.  Serum sodium remains low at 117 despite initiation of IV fluid hydration.  Urine osmolality greater than serum osmolality and urine sodium was found to be high at 98.  This suggests that the patient most likely has underlying SIADH.  We will obtain a CT scan of the chest to make sure there is no underlying lung mass.  In addition we will start the patient on 3% saline at 30 cc/h and discontinue 0.9 normal saline.  Further plan to be determined from response to 3% saline.  Thanks for consultation.

## 2019-04-28 NOTE — Progress Notes (Signed)
OT Cancellation Note  Patient Details Name: [name] MRN: [id number] DOB: Dec 24, 1945   Cancelled Evaluation:    Reason Eval/Treat Not Completed: Medical issues which prohibited therapy. Thank you for the OT consult. Order received and chart reviewed. Pt noted to have sodium of 119 which is outside of the parameters for therapy. Will follow acutely and re-attempt at a later time/date as available and pt medically appropriate for OT eval.  Shara Blazing, M.S., OTR/L Ascom: [phone] 04/28/19, 12:52 PM

## 2019-04-28 NOTE — Progress Notes (Signed)
PT Cancellation Note  Patient Details Name: [name] MRN: [id number] DOB: [birth year]   Cancelled Treatment:    Reason Eval/Treat Not Completed: Medical issues which prohibited therapy(Consult received and chart reviewed. Patient noted with critically low sodium levels (117); contraindicated for exertional activity at this time.  Will continue efforts next date pending medical stability and appropriateness.)   Marlinda Miranda H. Owens Shark, PT, DPT, NCS 04/28/19, 2:32 PM [phone]

## 2019-04-28 NOTE — Progress Notes (Signed)
PHARMACY CONSULT NOTE - FOLLOW UP  Pharmacy Consult for Electrolyte Monitoring and Replacement   Recent Labs: Potassium (mmol/L)  Date Value  04/28/2019 3.2 (L)  11/05/2014 4.0   Magnesium (mg/dL)  Date Value  04/28/2019 1.9  02/09/2014 2.2   Calcium (mg/dL)  Date Value  04/28/2019 8.4 (L)   Calcium, Total (mg/dL)  Date Value  11/05/2014 8.9   Albumin (g/dL)  Date Value  04/27/2019 4.0  11/05/2014 3.6   Phosphorus (mg/dL)  Date Value  11/06/2016 1.9 (L)   Sodium (mmol/L)  Date Value  04/28/2019 117 (LL)  11/05/2014 131 (L)     Assessment: 73 year old female now on hypertonic saline for persistent hyponatremia despite fluid hydration. Pharmacy to follow along to ensure Na does not increase too rapidly.  Goal of Therapy:  Na increase of no more than 8 mEq/24hr  Plan:  Will follow up with next lab draw.  Tawnya Crook ,PharmD Clinical Pharmacist 04/28/2019 3:28 PM

## 2019-04-28 NOTE — Progress Notes (Addendum)
CRITICAL VALUE ALERT  Critical Value:  Serum Osmality of 241  Date & Time Notied:  04/28/2019; 1930  Provider Notified: Estanislado Pandy, MD Bridgett Larsson MD  Orders Received/Actions taken:awaiting response 2000: MD response no new orders at this time

## 2019-04-28 NOTE — Progress Notes (Signed)
CRITICAL VALUE ALERT  Critical Value:  Potassium 3.2 and Sodium 119  Date & Time Notied: 04/28/2019 @ 0440  Provider Notified: Dr. Marcille Blanco  Orders Received/Actions taken: K-Dur 29mEq PO once now.

## 2019-04-28 NOTE — Consult Note (Signed)
[facility] Face-to-Face Psychiatry Consult   Reason for Consult:  Hallucinations  Referring Physician:  Dr Stark Jock Patient Identification: [name] MRN:  [id number] Principal Diagnosis: Hyponatremia Diagnosis:  Active Problems:   Hyponatremia   Hallucinations, visual  Total Time spent with patient: 15 minutes  04/28/2019-patient is a 73 year old female who was initially admitted for hyponatremia and hallucinations.  She was sleeping  this clinician saw this morning.  She did wake up on calling her name and  stated that she was doing  better.  She stated that she was all mixed up at that facility that she came to the hospital.  Per her sitter she was much more alert and oriented this morning.  She seems to be improving. We will continue the Celexa 40 mg for her depression and anxiety.  04/27/2019- [name] is a 73 y.o. female patient seen and evaluated for hallucinations.  When asked about hallucinations, she responded with "I would if could tell you what they were.  My children told me yesterday that I was seeing things, green things."  Denies current hallucinations with, "Well duh, no."  Confounding variables of hyponatremia, recent head injury, and prednisone use daily.  Her TSH level is in the normal limit.  Not responding on assessment.  Endorses depression of "not bad", taking Celexa.  Anxiety of "yes", mild.  Denies suicidal/homicidal ideations and substance abuse.  No medication recommendation at this time for her hallucinations as this issue appears to have resolved.  Continue Celexa 40 mg daily for depression and anxiety.  Taper off gabapentin as this could be the causative factor in her hyponatremia with a side effect of dizziness and unsteadiness which could be contributing to her frequent falls (current dose of 300 mg QID decreased to 200 mg QID, see treatment plan below).  HPI:  On admission:  [name] is a 73 y.o. female brought to the ED from home by her family with a chief  complaint of persistent headache and now visual hallucinations.  Patient had a fall with minor head injury approximately 12 days ago.  He was seen initially with negative CT head.  She has returned on 6/22 and 6/27 for persistent headaches.  Tonight her family brought her in under IVC because she is now hallucinating that her legs are green.  Patient denies any knowledge of this but states "I guess I am because that is what they are telling me".  Denies active SI/HI/AH.  States headache is generally better.  Family also reported that patient has been having more frequent falls and generalized weakness.  Patient found to have hyponatremia of 117, admitted to the medical floor.  Past Psychiatric History: depression, anxiety  Risk to Self:  none Risk to Others:  none Prior Inpatient Therapy:  none Prior Outpatient Therapy:  PCP  Past Medical History:  Past Medical History:  Diagnosis Date  . Allergy   . Asthma   . Back pain   . Bronchitis   . COPD (chronic obstructive pulmonary disease) ([place])   . Degenerative disc disease, lumbar   . Depression   . FHx: cholecystectomy   . GERD (gastroesophageal reflux disease)   . H/O: hysterectomy   . Headache   . Hypertension   . Murmur   . Osteoporosis   . Pneumonia 03/31/16   being treated by PCP  . Restless legs syndrome   . Scoliosis   . Shingles   . Thyroid disease     Past Surgical History:  Procedure Laterality Date  . ABDOMINAL HYSTERECTOMY    . APPENDECTOMY    . BACK SURGERY    . CHOLECYSTECTOMY    . COLONOSCOPY WITH PROPOFOL N/A 01/16/2018   Procedure: COLONOSCOPY WITH PROPOFOL;  Surgeon: Toledo, Benay Pike, MD;  Location: [facility] ENDOSCOPY;  Service: Gastroenterology;  Laterality: N/A;  . ESOPHAGOGASTRODUODENOSCOPY (EGD) WITH PROPOFOL N/A 01/16/2018   Procedure: ESOPHAGOGASTRODUODENOSCOPY (EGD) WITH PROPOFOL;  Surgeon: Toledo, Benay Pike, MD;  Location: [facility] ENDOSCOPY;  Service: Gastroenterology;  Laterality: N/A;  . JOINT REPLACEMENT  Left 1998   shoulder  . NECK SURGERY Bilateral   . ROTATOR CUFF REPAIR Right   . SHOULDER OPEN ROTATOR CUFF REPAIR Right   . SHOULDER SURGERY Left    replacement   Family History:  Family History  Problem Relation Age of Onset  . Kidney disease Father   . Heart disease Mother   . Diabetes Mother    Family Psychiatric  History: none Social History:  Social History   Substance and Sexual Activity  Alcohol Use No  . Alcohol/week: 0.0 standard drinks     Social History   Substance and Sexual Activity  Drug Use No    Social History   Socioeconomic History  . Marital status: Married    Spouse name: Not on file  . Number of children: Not on file  . Years of education: Not on file  . Highest education level: Not on file  Occupational History  . Not on file  Social Needs  . Financial resource strain: Not on file  . Food insecurity    Worry: Not on file    Inability: Not on file  . Transportation needs    Medical: Not on file    Non-medical: Not on file  Tobacco Use  . Smoking status: Former Smoker    Packs/day: 1.00    Years: 30.00    Pack years: 30.00    Types: Cigarettes    Quit date: 11/04/2016    Years since quitting: 2.4  . Smokeless tobacco: Never Used  Substance and Sexual Activity  . Alcohol use: No    Alcohol/week: 0.0 standard drinks  . Drug use: No  . Sexual activity: Not on file  Lifestyle  . Physical activity    Days per week: Not on file    Minutes per session: Not on file  . Stress: Not on file  Relationships  . Social Herbalist on phone: Not on file    Gets together: Not on file    Attends religious service: Not on file    Active member of club or organization: Not on file    Attends meetings of clubs or organizations: Not on file    Relationship status: Not on file  Other Topics Concern  . Not on file  Social History Narrative  . Not on file   Additional Social History:    Allergies:   Allergies  Allergen Reactions   . Adhesive [Tape] Other (See Comments)  . Other Rash    Plastic tape    Labs:  Results for orders placed or performed during the hospital encounter of 04/27/19 (from the past 48 hour(s))  Urinalysis, Complete w Microscopic     Status: Abnormal   Collection Time: 04/27/19 12:33 AM  Result Value Ref Range   Color, Urine YELLOW (A) YELLOW   APPearance CLEAR (A) CLEAR   Specific Gravity, Urine 1.014 1.005 - 1.030   pH 7.0 5.0 - 8.0   Glucose,  UA NEGATIVE NEGATIVE mg/dL   Hgb urine dipstick NEGATIVE NEGATIVE   Bilirubin Urine NEGATIVE NEGATIVE   Ketones, ur 20 (A) NEGATIVE mg/dL   Protein, ur NEGATIVE NEGATIVE mg/dL   Nitrite NEGATIVE NEGATIVE   Leukocytes,Ua NEGATIVE NEGATIVE   RBC / HPF 0-5 0 - 5 RBC/hpf   WBC, UA 0-5 0 - 5 WBC/hpf   Bacteria, UA NONE SEEN NONE SEEN   Squamous Epithelial / LPF 0-5 0 - 5   Mucus PRESENT     Comment: Performed at [facility], [address], [place], [place] [address]  SARS Coronavirus 2 (CEPHEID - Performed in [place] hospital lab), Hosp Order     Status: None   Collection Time: 04/27/19  3:19 AM   Specimen: Nasopharyngeal Swab  Result Value Ref Range   SARS Coronavirus 2 NEGATIVE NEGATIVE    Comment: (NOTE) If result is NEGATIVE SARS-CoV-2 target nucleic acids are NOT DETECTED. The SARS-CoV-2 RNA is generally detectable in upper and lower  respiratory specimens during the acute phase of infection. The lowest  concentration of SARS-CoV-2 viral copies this assay can detect is 250  copies / mL. A negative result does not preclude SARS-CoV-2 infection  and should not be used as the sole basis for treatment or other  patient management decisions.  A negative result may occur with  improper specimen collection / handling, submission of specimen other  than nasopharyngeal swab, presence of viral mutation(s) within the  areas targeted by this assay, and inadequate number of viral copies  (<250 copies / mL). A negative result must be  combined with clinical  observations, patient history, and epidemiological information. If result is POSITIVE SARS-CoV-2 target nucleic acids are DETECTED. The SARS-CoV-2 RNA is generally detectable in upper and lower  respiratory specimens dur ing the acute phase of infection.  Positive  results are indicative of active infection with SARS-CoV-2.  Clinical  correlation with patient history and other diagnostic information is  necessary to determine patient infection status.  Positive results do  not rule out bacterial infection or co-infection with other viruses. If result is PRESUMPTIVE POSTIVE SARS-CoV-2 nucleic acids MAY BE PRESENT.   A presumptive positive result was obtained on the submitted specimen  and confirmed on repeat testing.  While 2019 novel coronavirus  (SARS-CoV-2) nucleic acids may be present in the submitted sample  additional confirmatory testing may be necessary for epidemiological  and / or clinical management purposes  to differentiate between  SARS-CoV-2 and other Sarbecovirus currently known to infect humans.  If clinically indicated additional testing with an alternate test  methodology [id number]) is advised. The SARS-CoV-2 RNA is generally  detectable in upper and lower respiratory sp ecimens during the acute  phase of infection. The expected result is Negative. Fact Sheet for Patients:  StrictlyIdeas.no Fact Sheet for Healthcare Providers: BankingDealers.co.za This test is not yet approved or cleared by the [place] FDA and has been authorized for detection and/or diagnosis of SARS-CoV-2 by FDA under an Emergency Use Authorization (EUA).  This EUA will remain in effect (meaning this test can be used) for the duration of the COVID-19 declaration under Section 564(b)(1) of the Act, 21 U.S.C. section 360bbb-3(b)(1), unless the authorization is terminated or revoked sooner. Performed at [facility], [address] [place], [place] [address]   Comprehensive metabolic panel     Status: Abnormal   Collection Time: 04/27/19  3:40 AM  Result Value Ref Range   Sodium 117 (LL) 135 -  145 mmol/L    Comment: CRITICAL RESULT CALLED TO, READ BACK BY AND VERIFIED WITH SONJIA WEAVER AT [phone] ON 04/27/19 RWW    Potassium 3.8 3.5 - 5.1 mmol/L   Chloride 83 (L) 98 - 111 mmol/L   CO2 23 22 - 32 mmol/L   Glucose, Bld 104 (H) 70 - 99 mg/dL   BUN 8 8 - 23 mg/dL   Creatinine, Ser 0.39 (L) 0.44 - 1.00 mg/dL   Calcium 8.8 (L) 8.9 - 10.3 mg/dL   Total Protein 7.3 6.5 - 8.1 g/dL   Albumin 4.0 3.5 - 5.0 g/dL   AST 24 15 - 41 U/L   ALT 18 0 - 44 U/L   Alkaline Phosphatase 78 38 - 126 U/L   Total Bilirubin 0.6 0.3 - 1.2 mg/dL   GFR calc non Af Amer >60 >60 mL/min   GFR calc Af Amer >60 >60 mL/min   Anion gap 11 5 - 15    Comment: Performed at [facility], [address] [place], [place] [address]  CBC     Status: None   Collection Time: 04/27/19  3:40 AM  Result Value Ref Range   WBC 8.2 4.0 - 10.5 K/uL   RBC 4.44 3.87 - 5.11 MIL/uL   Hemoglobin 12.4 12.0 - 15.0 g/dL   HCT 36.3 36.0 - 46.0 %   MCV 81.8 80.0 - 100.0 fL   MCH 27.9 26.0 - 34.0 pg   MCHC 34.2 30.0 - 36.0 g/dL   RDW 13.6 11.5 - 15.5 %   Platelets 287 150 - 400 K/uL   nRBC 0.0 0.0 - 0.2 %    Comment: Performed at [facility], [address] [place], [place] [address]  Ethanol     Status: None   Collection Time: 04/27/19  3:40 AM  Result Value Ref Range   Alcohol, Ethyl (B) <10 <10 mg/dL    Comment: (NOTE) Lowest detectable limit for serum alcohol is 10 mg/dL. For medical purposes only. Performed at [facility], [place], [place] [address]   Troponin I (High Sensitivity)     Status: None   Collection Time: 04/27/19  3:40 AM  Result Value Ref Range   Troponin I (High Sensitivity) 2 <18 ng/L    Comment: (NOTE) Elevated high sensitivity troponin I (hsTnI) values and  significant  changes across serial measurements may suggest ACS but many other  chronic and acute conditions are known to elevate hsTnI results.  Refer to the "Links" section for chest pain algorithms and additional  guidance. Performed at [facility], [address] [place], [place] [address]   Lactic acid, plasma     Status: None   Collection Time: 04/27/19  3:40 AM  Result Value Ref Range   Lactic Acid, Venous 1.0 0.5 - 1.9 mmol/L    Comment: Performed at [facility], [place], [place] [address]  Acetaminophen level     Status: Abnormal   Collection Time: 04/27/19  3:40 AM  Result Value Ref Range   Acetaminophen (Tylenol), Serum <10 (L) 10 - 30 ug/mL    Comment: (NOTE) Therapeutic concentrations vary significantly. A range of 10-30 ug/mL  may be an effective concentration for many patients. However, some  are best treated at concentrations outside of this range. Acetaminophen concentrations >150 ug/mL at 4 hours after ingestion  and >50 ug/mL at 12 hours after ingestion are often associated with  toxic reactions. Performed at [facility], [place], [place] [address]   Salicylate level     Status: None   Collection Time: 04/27/19  3:40 AM  Result Value Ref Range   Salicylate Lvl <8.3 2.8 - 30.0 mg/dL    Comment: Performed at [facility], [address] [place], [place] [address]  Lactic acid, plasma     Status: None   Collection Time: 04/27/19  6:47 AM  Result Value Ref Range   Lactic Acid, Venous 1.0 0.5 - 1.9 mmol/L    Comment: Performed at [facility], [place], [place] [address]  Troponin I (High Sensitivity)     Status: None   Collection Time: 04/27/19  6:47 AM  Result Value Ref Range   Troponin I (High Sensitivity) 2 <18 ng/L    Comment: (NOTE) Elevated high sensitivity troponin I (hsTnI) values and significant  changes across serial measurements may suggest ACS but  many other  chronic and acute conditions are known to elevate hsTnI results.  Refer to the "Links" section for chest pain algorithms and additional  guidance. Performed at [facility], [place], [place] [address]   Troponin I (High Sensitivity)     Status: None   Collection Time: 04/27/19  8:21 AM  Result Value Ref Range   Troponin I (High Sensitivity) <2 <18 ng/L    Comment: (NOTE) Elevated high sensitivity troponin I (hsTnI) values and significant  changes across serial measurements may suggest ACS but many other  chronic and acute conditions are known to elevate hsTnI results.  Refer to the "Links" section for chest pain algorithms and additional  guidance. Performed at [facility], [address] [place], [place] [address]   TSH     Status: None   Collection Time: 04/27/19  8:21 AM  Result Value Ref Range   TSH 1.526 0.350 - 4.500 uIU/mL    Comment: Performed by a 3rd Generation assay with a functional sensitivity of <=0.01 uIU/mL. Performed at [facility], [address] [place], [place] [address]   Sodium     Status: Abnormal   Collection Time: 04/27/19 12:43 PM  Result Value Ref Range   Sodium 118 (LL) 135 - 145 mmol/L    Comment: CRITICAL RESULT CALLED TO, READ BACK BY AND VERIFIED WITH LISA RUSSELL AT 1333 04/27/2019.PMF Performed at [facility], [address] [place], [place] [address]   Osmolality     Status: Abnormal   Collection Time: 04/27/19 12:43 PM  Result Value Ref Range   Osmolality 245 (LL) 275 - 295 mOsm/kg    Comment: CRITICAL RESULT CALLED TO, READ BACK BY AND VERIFIED WITH: LISA RUSSELL AT 1421 04/27/2019.PMF Performed at [facility], [name] [place], [place] [address]   Cortisol     Status: None   Collection Time: 04/27/19 12:43 PM  Result Value Ref Range   Cortisol, Plasma 37.2 ug/dL    Comment: (NOTE) AM    6.7 - 22.6 ug/dL PM   <10.0       ug/dL Performed at  [facility] [address] [place],  [address]   Sodium     Status: Abnormal   Collection Time: 04/27/19  2:51 PM  Result Value Ref Range   Sodium 119 (LL) 135 - 145 mmol/L    Comment: CRITICAL RESULT CALLED TO, READ BACK BY AND VERIFIED WITH LISA RUSSELL AT 1525 04/27/2019.PMF Performed at [facility], [address] [place], [place] [address]   Sodium     Status: Abnormal   Collection Time: 04/27/19  6:48 PM  Result Value Ref Range   Sodium 118 (LL) 135 - 145 mmol/L    Comment: CRITICAL RESULT CALLED TO, READ BACK BY AND VERIFIED WITH STEPHANIE KENNEDY AT 1912 ON 04/27/2019 JJB Performed at [facility], [address] [place], [place] [address]   Osmolality, urine     Status: None   Collection Time: 04/27/19 11:02 PM  Result Value Ref Range   Osmolality, Ur 335 300 - 900 mOsm/kg    Comment: Performed at [facility], [address] [place], [place] [address]  Sodium, urine, random     Status: None   Collection Time: 04/27/19 11:02 PM  Result Value Ref Range   Sodium, Ur 98 mmol/L    Comment: Performed at [facility], [address] [place], [place] [address]  Basic metabolic panel     Status: Abnormal   Collection Time: 04/28/19  3:37 AM  Result Value Ref Range   Sodium 119 (LL) 135 - 145 mmol/L    Comment: CRITICAL RESULT CALLED TO, READ BACK BY AND VERIFIED WITH MELISSA COBB AT 0421 ON 04/28/19 RWW    Potassium 3.2 (L) 3.5 - 5.1 mmol/L   Chloride 84 (L) 98 - 111 mmol/L   CO2 26 22 - 32 mmol/L   Glucose, Bld 98 70 - 99 mg/dL   BUN 6 (L) 8 - 23 mg/dL   Creatinine, Ser 0.39 (L) 0.44 - 1.00 mg/dL   Calcium 8.4 (L) 8.9 - 10.3 mg/dL   GFR calc non Af Amer >60 >60 mL/min   GFR calc Af Amer >60 >60 mL/min   Anion gap 9 5 - 15    Comment: Performed at [facility], [address] [place], [place] [address]  CBC     Status: Abnormal   Collection Time: 04/28/19  3:37 AM  Result Value Ref Range   WBC 9.1  4.0 - 10.5 K/uL   RBC 4.29 3.87 - 5.11 MIL/uL   Hemoglobin 12.0 12.0 - 15.0 g/dL   HCT 34.9 (L) 36.0 - 46.0 %   MCV 81.4 80.0 - 100.0 fL   MCH 28.0 26.0 - 34.0 pg   MCHC 34.4 30.0 - 36.0 g/dL   RDW 13.6 11.5 - 15.5 %   Platelets 293 150 - 400 K/uL   nRBC 0.0 0.0 - 0.2 %    Comment: Performed at [facility], [address] [place], [place] [address]  Magnesium     Status: None   Collection Time: 04/28/19  3:37 AM  Result Value Ref Range   Magnesium 1.9 1.7 - 2.4 mg/dL    Comment: Performed at [facility], [address] [place], [place] [address]  Sodium     Status: Abnormal   Collection Time: 04/28/19  1:45 PM  Result Value Ref Range   Sodium 117 (LL) 135 - 145 mmol/L    Comment: CRITICAL RESULT CALLED TO, READ BACK BY AND VERIFIED WITH ALEKSEY FRASER/1412/CHC/04/28/2019 Performed at [facility], [address] [place], [place] [address]     Current Facility-Administered Medications  Medication Dose Route Frequency Provider Last Rate Last Dose  . 0.9 %  sodium chloride infusion   Intravenous Continuous Ojie, Jude, MD 75 mL/hr at 04/28/19 1000    . acetaminophen (TYLENOL) tablet 650 mg  650 mg Oral Q6H PRN Harrie Foreman, MD  650 mg at 04/28/19 0116   Or  . acetaminophen (TYLENOL) suppository 650 mg  650 mg Rectal Q6H PRN Harrie Foreman, MD      . albuterol (PROVENTIL) (2.5 MG/3ML) 0.083% nebulizer solution 2.5 mg  2.5 mg Nebulization Q4H PRN Harrie Foreman, MD   2.5 mg at 04/28/19 0931  . amLODipine (NORVASC) tablet 5 mg  5 mg Oral Daily Harrie Foreman, MD   5 mg at 04/28/19 1324  . butalbital-acetaminophen-caffeine (FIORICET) 50-325-40 MG per tablet 1 tablet  1 tablet Oral Q6H PRN Harrie Foreman, MD   1 tablet at 04/28/19 0349  . cholecalciferol (VITAMIN D) tablet 2,000 Units  2,000 Units Oral Daily Harrie Foreman, MD   2,000 Units at 04/28/19 [phone]  . citalopram (CELEXA) tablet 40 mg  40 mg Oral Daily Harrie Foreman,  MD   40 mg at 04/28/19 0916  . docusate sodium (COLACE) capsule 100 mg  100 mg Oral BID Harrie Foreman, MD   100 mg at 04/27/19 2104  . enoxaparin (LOVENOX) injection 40 mg  40 mg Subcutaneous Q12H Harrie Foreman, MD   40 mg at 04/28/19 0915  . gabapentin (NEURONTIN) capsule 200 mg  200 mg Oral QID Patrecia Pour, NP   200 mg at 04/28/19 2725  . hydrALAZINE (APRESOLINE) injection 10 mg  10 mg Intravenous Q6H PRN Ojie, Jude, MD      . levothyroxine (SYNTHROID) tablet 112 mcg  112 mcg Oral QAC breakfast Harrie Foreman, MD   112 mcg at 04/28/19 0522  . metoprolol tartrate (LOPRESSOR) tablet 100 mg  100 mg Oral BID Harrie Foreman, MD   100 mg at 04/28/19 3664  . multivitamin with minerals tablet 1 tablet  1 tablet Oral Daily Harrie Foreman, MD   1 tablet at 04/28/19 [phone]  . ondansetron (ZOFRAN) tablet 4 mg  4 mg Oral Q6H PRN Harrie Foreman, MD       Or  . ondansetron [facility]) injection 4 mg  4 mg Intravenous Q6H PRN Harrie Foreman, MD      . pantoprazole (PROTONIX) EC tablet 40 mg  40 mg Oral Daily Harrie Foreman, MD   40 mg at 04/28/19 0916  . [START ON 04/29/2019] potassium chloride SA (K-DUR) CR tablet 20 mEq  20 mEq Oral Daily Harrie Foreman, MD      . predniSONE (DELTASONE) tablet 40 mg  40 mg Oral Q breakfast Harrie Foreman, MD   40 mg at 04/28/19 0917  . sodium chloride (hypertonic) 3 % solution   Intravenous Continuous Lateef, Munsoor, MD      . tiotropium (SPIRIVA) inhalation capsule ([facility] use ONLY) 18 mcg  18 mcg Inhalation Daily Harrie Foreman, MD   18 mcg at 04/28/19 0913    Musculoskeletal: Strength & Muscle Tone: decreased Gait & Station: did not witness Patient leans: N/A  Psychiatric Specialty Exam: Physical Exam  Nursing note and vitals reviewed. Constitutional: She is oriented to person, place, and time. She appears well-developed and well-nourished.  HENT:  Head: Normocephalic.  Neck: Normal range of motion.  Respiratory: Effort  normal.  Neurological: She is alert and oriented to person, place, and time.  Psychiatric: Her speech is normal and behavior is normal. Judgment and thought content normal. Her mood appears anxious. Her affect is blunt. Cognition and memory are normal. She exhibits a depressed mood.    Review of Systems  Constitutional: Positive for malaise/fatigue.  Musculoskeletal:  Positive for falls and neck pain.  Neurological: Positive for headaches.  Psychiatric/Behavioral: Positive for depression. The patient is nervous/anxious.   All other systems reviewed and are negative.   Blood pressure (!) 154/97, pulse 76, temperature 98.8 F (37.1 C), resp. rate 17, height 4\' 10"  (1.473 m), weight 94.5 kg, SpO2 97 %.Body mass index is 43.54 kg/m.  General Appearance: Casual  Eye Contact:  Fair  Speech:  Normal Rate  Volume:  Normal  Mood: Patient was quite somnolent but stated that she was feeling better  Affect:  Blunt  Thought Process:  Coherent and Descriptions of Associations: Intact  Orientation:  Full (Time, Place, and Person)  Thought Content:  WDL and Logical  Suicidal Thoughts:  No  Homicidal Thoughts:  No  Memory:  Immediate;   Good Recent;   Fair Remote;   Good  Judgement:  Fair  Insight:  Fair  Psychomotor Activity:  Decreased  Concentration:  Concentration: Fair and Attention Span: Fair  Recall:  [place] of Knowledge:  Good  Language:  Good  Akathisia:  No  Handed:  Right  AIMS (if indicated):     Assets:  Housing Leisure Time Resilience Social Support  ADL's:  Impaired  Cognition:  WNL  Sleep:   Fair     Treatment Plan Summary: Daily contact with patient to assess and evaluate symptoms and progress in treatment, Medication management and Plan hallucinations, visual:  -Continue to monitor for return of hallucinations, none noted since last night  Major depressive disorder, recurrent, mild: -Continue Celexa 40 mg daily  Anxiety: -Continue Celexa 40 mg  daily  Falls and Hyponatremia -Decrease her gabapentin 300 mg QID to 200 mg QID with a continue tapered off plan as this can cause hyponatremia and falls  Disposition: No evidence of imminent risk to self or others at present.   Patient does not meet criteria for psychiatric inpatient admission. Supportive therapy provided about ongoing stressors.  Elvin So, MD 04/28/2019 3:07 PM

## 2019-04-28 NOTE — Progress Notes (Signed)
[facility] [place] NAME: [name]    MR#:  [id number]  DATE OF BIRTH:  06-06-1946  SUBJECTIVE:  CHIEF COMPLAINT:   Chief Complaint  Patient presents with   Headache   Altered Mental Status   Patient appeared slightly short of breath.  Rate of IV fluid decreased to 75 cc an hour this morning. No hallucination this morning.  Admitted initially with altered mental status and hyponatremia.  No seizures.  Awake and alert this morning.   REVIEW OF SYSTEMS:  Review of Systems  Constitutional: Negative for chills and fever.  HENT: Negative for hearing loss and tinnitus.   Eyes: Negative for blurred vision and double vision.  Respiratory: Negative for cough and shortness of breath.   Cardiovascular: Negative for chest pain and palpitations.  Gastrointestinal: Negative for heartburn, nausea and vomiting.  Genitourinary: Negative for dysuria and urgency.  Musculoskeletal: Negative for myalgias and neck pain.  Skin: Negative for itching and rash.  Neurological: Negative for dizziness and headaches.  Psychiatric/Behavioral: Negative for depression.       Confusion appears improved    DRUG ALLERGIES:   Allergies  Allergen Reactions   Adhesive [Tape] Other (See Comments)   Other Rash    Plastic tape   VITALS:  Blood pressure (!) 154/97, pulse 76, temperature 98.8 F (37.1 C), resp. rate 17, height 4\' 10"  (1.473 m), weight 94.5 kg, SpO2 97 %. PHYSICAL EXAMINATION:   Physical Exam  Constitutional: She is oriented to person, place, and time. She appears well-developed and well-nourished.  HENT:  Head: Normocephalic and atraumatic.  Right Ear: External ear normal.  Eyes: Pupils are equal, round, and reactive to light. Conjunctivae are normal. Right eye exhibits no discharge.  Neck: Normal range of motion. Neck supple. No thyromegaly present.  Cardiovascular: Normal rate, regular rhythm and normal heart sounds.  Respiratory:  Effort normal and breath sounds normal. She has no wheezes.  GI: Soft. Bowel sounds are normal. She exhibits no distension.  Musculoskeletal: Normal range of motion.        General: No edema.  Neurological: She is alert and oriented to person, place, and time.  Moving all extremities with no focal deficit  Skin: Skin is warm. She is not diaphoretic. No erythema.  Psychiatric: She has a normal mood and affect. Her behavior is normal.   LABORATORY PANEL:  Female CBC Recent Labs  Lab 04/28/19 0337  WBC 9.1  HGB 12.0  HCT 34.9*  PLT 293   ------------------------------------------------------------------------------------------------------------------ Chemistries  Recent Labs  Lab 04/27/19 0340  04/28/19 0337  NA 117*   < > 119*  K 3.8  --  3.2*  CL 83*  --  84*  CO2 23  --  26  GLUCOSE 104*  --  98  BUN 8  --  6*  CREATININE 0.39*  --  0.39*  CALCIUM 8.8*  --  8.4*  MG  --   --  1.9  AST 24  --   --   ALT 18  --   --   ALKPHOS 78  --   --   BILITOT 0.6  --   --    < > = values in this interval not displayed.   RADIOLOGY:  No results found. ASSESSMENT AND PLAN:   1.  Hyponatremia: No seizure. Reported to have intermittent confusion and hallucinations which appear significantly improved. Patient awake and alert and oriented this morning.  Moving all extremities with  no deficit.   Sodium level was 117.  Patient started on normal saline previously.  Sodium level of 119 today. Decreased rate of normal saline from 125 to 75 cc an hour today since patient appeared slightly short of breath this morning.  No seizures.   Requested for nephrology input and assistance with management of hyponatremia.   Serum osmolality of 245.  Urine osmolality of 335.  Urine sodium of 98.  TSH level normal.  Cortisol level of 37.2. CT scan of the head without acute findings.  Clinically no evidence of infectious process found.  2.  Hallucinations Patient was seen by psychiatry service due to  hallucinations.  Notified by nursing staff.IVC was initiated on admission due to hallucinations as well. Dose of gabapentin decreased from 300 mg 4 times daily to 200 mg 4 times daily with plans to gradually taper off since this can cause hyponatremia.  To continue Celexa for depression and anxiety. Will follow-up with psychiatry service regarding discontinuation of IVC. I discussed with patient's granddaughter who lives with the patient at home and she confirmed she took out the IVC papers because over the last 3 months patient has been progressively getting more confused and having recurrent falls at home.  Family having difficulty with caring for patient at home as well.  3.  Hypertension: Intermittently controlled.  Continue metoprolol and amlodipine.  Placed on PRN IV hydralazine with parameters  4.  Hypothyroidism: TSH level normal.  continue Synthroid  5.  Falls: Fall precautions. CT of the head with no acute findings. PT and OT evaluation requested Patient with most likely benefit from skilled nursing facility placement on discharge from the hospital.    DVT prophylaxis: Lovenox   All the records are reviewed and case discussed with Care Management/Social Worker. Management plans discussed with the patient, family and they are in agreement.  CODE STATUS: Full Code  TOTAL TIME TAKING CARE OF THIS PATIENT: 36 minutes.   More than 50% of the time was spent in counseling/coordination of care: YES  POSSIBLE D/C IN 2 DAYS, DEPENDING ON CLINICAL CONDITION.   [name] M.D on 04/28/2019 at 12:04 PM  Between 7am to 6pm - Pager - [phone]  After 6pm go to [email or web address] - Proofreader  [facility]  Office  [phone]  CC: Primary care physician; Baxter Hire, MD  Note: This dictation was prepared with Dragon dictation along with smaller phrase technology. Any transcriptional errors that result from this process are unintentional.

## 2019-04-29 ENCOUNTER — Inpatient Hospital Stay: Payer: Medicare Other

## 2019-04-29 ENCOUNTER — Other Ambulatory Visit: Payer: Self-pay

## 2019-04-29 DIAGNOSIS — R441 Visual hallucinations: Secondary | ICD-10-CM

## 2019-04-29 LAB — BASIC METABOLIC PANEL
Anion gap: 7 (ref 5–15)
BUN: 5 mg/dL — ABNORMAL LOW (ref 8–23)
CO2: 26 mmol/L (ref 22–32)
Calcium: 8.3 mg/dL — ABNORMAL LOW (ref 8.9–10.3)
Chloride: 87 mmol/L — ABNORMAL LOW (ref 98–111)
Creatinine, Ser: 0.34 mg/dL — ABNORMAL LOW (ref 0.44–1.00)
GFR calc Af Amer: >60 mL/min (ref >60)
GFR calc non Af Amer: >60 mL/min (ref >60)
Glucose, Bld: 88 mg/dL (ref 70–99)
Potassium: 3.2 mmol/L — ABNORMAL LOW (ref 3.5–5.1)
Sodium: 120 mmol/L — ABNORMAL LOW (ref 135–145)

## 2019-04-29 LAB — MAGNESIUM: Magnesium: 1.7 mg/dL (ref 1.7–2.4)

## 2019-04-29 LAB — CBC
HCT: 32.9 % — ABNORMAL LOW (ref 36.0–46.0)
Hemoglobin: 11.1 g/dL — ABNORMAL LOW (ref 12.0–15.0)
MCH: 27.9 pg (ref 26.0–34.0)
MCHC: 33.7 g/dL (ref 30.0–36.0)
MCV: 82.7 fL (ref 80.0–100.0)
Platelets: 284 10*3/uL (ref 150–400)
RBC: 3.98 MIL/uL (ref 3.87–5.11)
RDW: 13.6 % (ref 11.5–15.5)
WBC: 8.5 10*3/uL (ref 4.0–10.5)
nRBC: 0 % (ref 0.0–0.2)

## 2019-04-29 LAB — SODIUM
Sodium: 118 mmol/L — CL (ref 135–145)
Sodium: 122 mmol/L — ABNORMAL LOW (ref 135–145)
Sodium: 123 mmol/L — ABNORMAL LOW (ref 135–145)
Sodium: 126 mmol/L — ABNORMAL LOW (ref 135–145)
Sodium: 127 mmol/L — ABNORMAL LOW (ref 135–145)

## 2019-04-29 LAB — MRSA PCR SCREENING: MRSA by PCR: NEGATIVE

## 2019-04-29 LAB — GLUCOSE, CAPILLARY: Glucose-Capillary: 76 mg/dL (ref 70–99)

## 2019-04-29 MED ORDER — SODIUM CHLORIDE 0.9% FLUSH
10.0000 mL | Freq: Two times a day (BID) | INTRAVENOUS | Status: DC
  Administered 2019-04-29 – 2019-04-30 (×3): 10 mL
  Administered 2019-04-30: 20 mL
  Administered 2019-05-01: 11:00:00 10 mL
  Administered 2019-05-01: 20 mL
  Administered 2019-05-02: 21:00:00 10 mL
  Administered 2019-05-02: 20 mL
  Administered 2019-05-03 – 2019-05-04 (×3): 10 mL
  Administered 2019-05-04: 20 mL
  Administered 2019-05-05: 10 mL

## 2019-04-29 MED ORDER — SODIUM CHLORIDE 3 % IV SOLN
INTRAVENOUS | Status: DC
  Administered 2019-04-29: 40 mL/h via INTRAVENOUS
  Filled 2019-04-29 (×3): qty 500

## 2019-04-29 MED ORDER — LORAZEPAM 2 MG/ML IJ SOLN
1.0000 mg | Freq: Once | INTRAMUSCULAR | Status: AC
  Administered 2019-04-29: 04:00:00 1 mg via INTRAVENOUS
  Filled 2019-04-29: qty 1

## 2019-04-29 MED ORDER — DIPHENHYDRAMINE HCL 50 MG/ML IJ SOLN
12.5000 mg | Freq: Once | INTRAMUSCULAR | Status: AC
  Administered 2019-04-29: 12.5 mg via INTRAVENOUS
  Filled 2019-04-29: qty 1

## 2019-04-29 MED ORDER — MAGNESIUM SULFATE 2 GM/50ML IV SOLN
2.0000 g | Freq: Once | INTRAVENOUS | Status: AC
  Administered 2019-04-29: 2 g via INTRAVENOUS
  Filled 2019-04-29: qty 50

## 2019-04-29 MED ORDER — SODIUM CHLORIDE 0.9% FLUSH: 10.0000 mL | INTRAVENOUS | Status: DC | PRN

## 2019-04-29 MED ORDER — IOHEXOL 300 MG/ML  SOLN
75.0000 mL | Freq: Once | INTRAMUSCULAR | Status: AC | PRN
  Administered 2019-04-29: 75 mL via INTRAVENOUS

## 2019-04-29 MED ORDER — PANTOPRAZOLE SODIUM 40 MG IV SOLR
40.0000 mg | Freq: Every day | INTRAVENOUS | Status: DC
  Administered 2019-04-29 – 2019-05-01 (×3): 40 mg via INTRAVENOUS
  Filled 2019-04-29 (×3): qty 40

## 2019-04-29 MED ORDER — HALOPERIDOL LACTATE 5 MG/ML IJ SOLN
5.0000 mg | Freq: Once | INTRAMUSCULAR | Status: AC
  Administered 2019-04-29: 5 mg via INTRAVENOUS

## 2019-04-29 MED ORDER — HALOPERIDOL LACTATE 5 MG/ML IJ SOLN
2.0000 mg | Freq: Once | INTRAMUSCULAR | Status: AC
  Administered 2019-04-29: 2 mg via INTRAVENOUS

## 2019-04-29 MED ORDER — CITALOPRAM HYDROBROMIDE 20 MG PO TABS
20.0000 mg | ORAL_TABLET | Freq: Every day | ORAL | Status: DC
  Administered 2019-04-30 – 2019-05-01 (×2): 20 mg via ORAL
  Filled 2019-04-29 (×2): qty 1

## 2019-04-29 MED ORDER — MORPHINE SULFATE (PF) 2 MG/ML IV SOLN
1.0000 mg | INTRAVENOUS | Status: DC | PRN
  Administered 2019-04-29: 06:00:00 1 mg via INTRAVENOUS
  Filled 2019-04-29: qty 1

## 2019-04-29 MED ORDER — GABAPENTIN 100 MG PO CAPS
200.0000 mg | ORAL_CAPSULE | Freq: Two times a day (BID) | ORAL | Status: DC
  Administered 2019-04-29 – 2019-05-01 (×4): 200 mg via ORAL
  Filled 2019-04-29 (×5): qty 2

## 2019-04-29 MED ORDER — HALOPERIDOL LACTATE 5 MG/ML IJ SOLN
INTRAMUSCULAR | Status: AC
  Administered 2019-04-29: 5 mg
  Filled 2019-04-29: qty 1

## 2019-04-29 MED ORDER — HALOPERIDOL LACTATE 5 MG/ML IJ SOLN
INTRAMUSCULAR | Status: AC
  Filled 2019-04-29: qty 1

## 2019-04-29 MED ORDER — POTASSIUM CHLORIDE 10 MEQ/50ML IV SOLN
10.0000 meq | INTRAVENOUS | Status: AC
  Administered 2019-04-29 (×4): 10 meq via INTRAVENOUS
  Filled 2019-04-29 (×4): qty 50

## 2019-04-29 NOTE — Progress Notes (Signed)
Pt asleep. Follow commands. VSS. NSR on cardiac monitor. Will transfer to ICU. Report given to [place], Therapist, sports.

## 2019-04-29 NOTE — Progress Notes (Signed)
PT Cancellation Note  Patient Details Name: [name] MRN: [id number] DOB: 1946/09/17   Cancelled Treatment:    Reason Eval/Treat Not Completed: Medical issues which prohibited therapy. Pt transferred to CCU, indicating change in status. Not appropriate for PT evaluation at this time. Will discontinue current orders. Please re-order when pt stable.   Arshad Oberholzer 04/29/2019, 10:06 AM  Greggory Stallion, PT, DPT [phone]

## 2019-04-29 NOTE — Progress Notes (Signed)
Pharmacy Electrolyte Monitoring Consult:  Pharmacy consulted to assist in monitoring and replacing electrolytes in this 73 y.o. female admitted on 04/27/2019 with Headache and Altered Mental Status   Labs:  Sodium (mmol/L)  Date Value  04/29/2019 126 (L)  11/05/2014 131 (L)   Potassium (mmol/L)  Date Value  04/29/2019 3.2 (L)  11/05/2014 4.0   Magnesium (mg/dL)  Date Value  04/29/2019 1.7  02/09/2014 2.2   Phosphorus (mg/dL)  Date Value  11/06/2016 1.9 (L)   Calcium (mg/dL)  Date Value  04/29/2019 8.3 (L)   Calcium, Total (mg/dL)  Date Value  11/05/2014 8.9   Albumin (g/dL)  Date Value  04/27/2019 4.0  11/05/2014 3.6    Assessment/Plan: Sodium increased to 126. Hypertonic saline held. Continue with scheduled labs.   Pharmacy will continue to monitor and adjust per consult.   Roya Gieselman L 04/29/2019 5:22 PM

## 2019-04-29 NOTE — Progress Notes (Signed)
OT Cancellation Note  Patient Details Name: [name] MRN: [id number] DOB: 1946-08-17   Cancelled Treatment:    Reason Eval/Treat Not Completed: Medical issues which prohibited therapy. Consult received, chart reviewed. Pt noted to have been transferred to the ICU this am. Spoke with 1C RN who indicated pt was agitated overnight, concerns of her O2 sats, and an order to transfer pt to stepdown this am. Due to change in pt status, will require new OT orders to evaluate pt. Will complete current order. Please re-consult if/when pt is medically appropriate for therapy.   Jeni Salles, MPH, MS, OTR/L ascom [phone] 04/29/19, 9:26 AM

## 2019-04-29 NOTE — Consult Note (Signed)
[facility] Face-to-Face Psychiatry Consult   Reason for Consult:  Hallucinations  Referring Physician:  Dr Stark Jock Patient Identification: [name] MRN:  [id number] Principal Diagnosis: Hyponatremia Diagnosis:  Active Problems:   Hyponatremia   Hallucinations, visual  Total Time spent with patient: 15 minutes  04/29/2019-patient was transferred to the ICU early this morning and is critically ill with confusion and lethargy.  Per critical care patient has hyponatremia possibly due to SIADH.  Will decrease dose of Celexa to 20mg  and taper down completely since it may have a role in causing her SIADH in turn leading to the confusion.  04/28/2019-patient is a 73 year old female who was initially admitted for hyponatremia and hallucinations.  She was sleeping  this clinician saw this morning.  She did wake up on calling her name and  stated that she was doing  better.  She stated that she was all mixed up at that facility that she came to the hospital.  Per her sitter she was much more alert and oriented this morning.  She seems to be improving. We will continue the Celexa 40 mg for her depression and anxiety.  04/27/2019- [name] is a 73 y.o. female patient seen and evaluated for hallucinations.  When asked about hallucinations, she responded with "I would if could tell you what they were.  My children told me yesterday that I was seeing things, green things."  Denies current hallucinations with, "Well duh, no."  Confounding variables of hyponatremia, recent head injury, and prednisone use daily.  Her TSH level is in the normal limit.  Not responding on assessment.  Endorses depression of "not bad", taking Celexa.  Anxiety of "yes", mild.  Denies suicidal/homicidal ideations and substance abuse.  No medication recommendation at this time for her hallucinations as this issue appears to have resolved.  Continue Celexa 40 mg daily for depression and anxiety.  Taper off gabapentin as this could be the  causative factor in her hyponatremia with a side effect of dizziness and unsteadiness which could be contributing to her frequent falls (current dose of 300 mg QID decreased to 200 mg QID, see treatment plan below).  HPI:  On admission:  [name] is a 73 y.o. female brought to the ED from home by her family with a chief complaint of persistent headache and now visual hallucinations.  Patient had a fall with minor head injury approximately 12 days ago.  He was seen initially with negative CT head.  She has returned on 6/22 and 6/27 for persistent headaches.  Tonight her family brought her in under IVC because she is now hallucinating that her legs are green.  Patient denies any knowledge of this but states "I guess I am because that is what they are telling me".  Denies active SI/HI/AH.  States headache is generally better.  Family also reported that patient has been having more frequent falls and generalized weakness.  Patient found to have hyponatremia of 117, admitted to the medical floor.  Past Psychiatric History: depression, anxiety  Risk to Self:  none Risk to Others:  none Prior Inpatient Therapy:  none Prior Outpatient Therapy:  PCP  Past Medical History:  Past Medical History:  Diagnosis Date  . Allergy   . Asthma   . Back pain   . Bronchitis   . COPD (chronic obstructive pulmonary disease) ([place])   . Degenerative disc disease, lumbar   . Depression   . FHx: cholecystectomy   . GERD (gastroesophageal reflux disease)   .  H/O: hysterectomy   . Headache   . Hypertension   . Murmur   . Osteoporosis   . Pneumonia 03/31/16   being treated by PCP  . Restless legs syndrome   . Scoliosis   . Shingles   . Thyroid disease     Past Surgical History:  Procedure Laterality Date  . ABDOMINAL HYSTERECTOMY    . APPENDECTOMY    . BACK SURGERY    . CHOLECYSTECTOMY    . COLONOSCOPY WITH PROPOFOL N/A 01/16/2018   Procedure: COLONOSCOPY WITH PROPOFOL;  Surgeon: Toledo, Benay Pike, MD;   Location: [facility] ENDOSCOPY;  Service: Gastroenterology;  Laterality: N/A;  . ESOPHAGOGASTRODUODENOSCOPY (EGD) WITH PROPOFOL N/A 01/16/2018   Procedure: ESOPHAGOGASTRODUODENOSCOPY (EGD) WITH PROPOFOL;  Surgeon: Toledo, Benay Pike, MD;  Location: [facility] ENDOSCOPY;  Service: Gastroenterology;  Laterality: N/A;  . JOINT REPLACEMENT Left 1998   shoulder  . NECK SURGERY Bilateral   . ROTATOR CUFF REPAIR Right   . SHOULDER OPEN ROTATOR CUFF REPAIR Right   . SHOULDER SURGERY Left    replacement   Family History:  Family History  Problem Relation Age of Onset  . Kidney disease Father   . Heart disease Mother   . Diabetes Mother    Family Psychiatric  History: none Social History:  Social History   Substance and Sexual Activity  Alcohol Use No  . Alcohol/week: 0.0 standard drinks     Social History   Substance and Sexual Activity  Drug Use No    Social History   Socioeconomic History  . Marital status: Married    Spouse name: Not on file  . Number of children: Not on file  . Years of education: Not on file  . Highest education level: Not on file  Occupational History  . Not on file  Social Needs  . Financial resource strain: Not on file  . Food insecurity    Worry: Not on file    Inability: Not on file  . Transportation needs    Medical: Not on file    Non-medical: Not on file  Tobacco Use  . Smoking status: Former Smoker    Packs/day: 1.00    Years: 30.00    Pack years: 30.00    Types: Cigarettes    Quit date: 11/04/2016    Years since quitting: 2.4  . Smokeless tobacco: Never Used  Substance and Sexual Activity  . Alcohol use: No    Alcohol/week: 0.0 standard drinks  . Drug use: No  . Sexual activity: Not on file  Lifestyle  . Physical activity    Days per week: Not on file    Minutes per session: Not on file  . Stress: Not on file  Relationships  . Social Herbalist on phone: Not on file    Gets together: Not on file    Attends religious service: Not  on file    Active member of club or organization: Not on file    Attends meetings of clubs or organizations: Not on file    Relationship status: Not on file  Other Topics Concern  . Not on file  Social History Narrative  . Not on file   Additional Social History:    Allergies:   Allergies  Allergen Reactions  . Adhesive [Tape] Other (See Comments)  . Other Rash    Plastic tape    Labs:  Results for orders placed or performed during the hospital encounter of 04/27/19 (from the past 48 hour(s))  Sodium     Status: Abnormal   Collection Time: 04/27/19  6:48 PM  Result Value Ref Range   Sodium 118 (LL) 135 - 145 mmol/L    Comment: CRITICAL RESULT CALLED TO, READ BACK BY AND VERIFIED WITH STEPHANIE KENNEDY AT 1912 ON 04/27/2019 JJB Performed at [facility], [address] [place], [place] [address]   Osmolality, urine     Status: None   Collection Time: 04/27/19 11:02 PM  Result Value Ref Range   Osmolality, Ur 335 300 - 900 mOsm/kg    Comment: Performed at [facility], [address] [place], [place] [address]  Sodium, urine, random     Status: None   Collection Time: 04/27/19 11:02 PM  Result Value Ref Range   Sodium, Ur 98 mmol/L    Comment: Performed at [facility], [address] [place], [place] [address]  Basic metabolic panel     Status: Abnormal   Collection Time: 04/28/19  3:37 AM  Result Value Ref Range   Sodium 119 (LL) 135 - 145 mmol/L    Comment: CRITICAL RESULT CALLED TO, READ BACK BY AND VERIFIED WITH MELISSA COBB AT 0421 ON 04/28/19 RWW    Potassium 3.2 (L) 3.5 - 5.1 mmol/L   Chloride 84 (L) 98 - 111 mmol/L   CO2 26 22 - 32 mmol/L   Glucose, Bld 98 70 - 99 mg/dL   BUN 6 (L) 8 - 23 mg/dL   Creatinine, Ser 0.39 (L) 0.44 - 1.00 mg/dL   Calcium 8.4 (L) 8.9 - 10.3 mg/dL   GFR calc non Af Amer >60 >60 mL/min   GFR calc Af Amer >60 >60 mL/min   Anion gap 9 5 - 15    Comment: Performed at [facility], [address] [name], [place] [address]  CBC     Status: Abnormal   Collection Time: 04/28/19  3:37 AM  Result Value Ref Range   WBC 9.1 4.0 - 10.5 K/uL   RBC 4.29 3.87 - 5.11 MIL/uL   Hemoglobin 12.0 12.0 - 15.0 g/dL   HCT 34.9 (L) 36.0 - 46.0 %   MCV 81.4 80.0 - 100.0 fL   MCH 28.0 26.0 - 34.0 pg   MCHC 34.4 30.0 - 36.0 g/dL   RDW 13.6 11.5 - 15.5 %   Platelets 293 150 - 400 K/uL   nRBC 0.0 0.0 - 0.2 %    Comment: Performed at [facility], [address] [place], [place] [address]  Magnesium     Status: None   Collection Time: 04/28/19  3:37 AM  Result Value Ref Range   Magnesium 1.9 1.7 - 2.4 mg/dL    Comment: Performed at [facility], [address] [place], [place] [address]  Sodium     Status: Abnormal   Collection Time: 04/28/19  1:45 PM  Result Value Ref Range   Sodium 117 (LL) 135 - 145 mmol/L    Comment: CRITICAL RESULT CALLED TO, READ BACK BY AND VERIFIED WITH ALEKSEY FRASER/1412/CHC/04/28/2019 Performed at [facility], [address] [place], [place] [address]   Sodium     Status: Abnormal   Collection Time: 04/28/19  3:21 PM  Result Value Ref Range   Sodium 116 (LL) 135 - 145 mmol/L    Comment: CRITICAL RESULT CALLED TO, READ BACK BY AND VERIFIED WITH ALESKEY FRASER 1539/CHC/04/28/2019 Performed at [facility], [address], [place], [place] [address]   Osmolality  Status: Abnormal   Collection Time: 04/28/19  3:21 PM  Result Value Ref Range   Osmolality 241 (LL) 275 - 295 mOsm/kg    Comment: CRITICAL RESULT CALLED TO, READ BACK BY AND VERIFIED WITH: DEJA MALCOM @1913  04/28/19 MJU Performed at [facility], [address] [place], [place] [address]   Sodium     Status: Abnormal   Collection Time: 04/28/19  5:19 PM  Result Value Ref Range   Sodium 118 (LL) 135 - 145 mmol/L    Comment: CRITICAL RESULT CALLED TO, READ BACK BY AND VERIFIED WITH ALEKSEY FRRASER/CHC/1749/04/27/2020 Performed at [facility], [address] [place], [place] [address]   Sodium     Status: Abnormal   Collection Time: 04/28/19  9:43 PM  Result Value Ref Range   Sodium 117 (LL) 135 - 145 mmol/L    Comment: CRITICAL RESULT CALLED TO, READ BACK BY AND VERIFIED WITH BRANDY MILLER AT 2210 ON 04/28/2019 JJB Performed at [facility], [address] [place], [place] [address]   Sodium     Status: Abnormal   Collection Time: 04/29/19 12:10 AM  Result Value Ref Range   Sodium 118 (LL) 135 - 145 mmol/L    Comment: CRITICAL RESULT CALLED TO, READ BACK BY AND VERIFIED WITH BETTY MILLER AT 0036 ON 04/29/2019 JJB Performed at [facility], [address] [place], [place] [address]   Basic metabolic panel     Status: Abnormal   Collection Time: 04/29/19  4:23 AM  Result Value Ref Range   Sodium 120 (L) 135 - 145 mmol/L   Potassium 3.2 (L) 3.5 - 5.1 mmol/L   Chloride 87 (L) 98 - 111 mmol/L   CO2 26 22 - 32 mmol/L   Glucose, Bld 88 70 - 99 mg/dL   BUN 5 (L) 8 - 23 mg/dL   Creatinine, Ser 0.34 (L) 0.44 - 1.00 mg/dL   Calcium 8.3 (L) 8.9 - 10.3 mg/dL   GFR calc non Af Amer >60 >60 mL/min   GFR calc Af Amer >60 >60 mL/min   Anion gap 7 5 - 15    Comment: Performed at [facility], [address] [place], [place] [address]  CBC     Status: Abnormal   Collection Time: 04/29/19  4:23 AM  Result Value Ref Range   WBC 8.5 4.0 - 10.5 K/uL   RBC 3.98 3.87 - 5.11 MIL/uL   Hemoglobin 11.1 (L) 12.0 - 15.0 g/dL   HCT 32.9 (L) 36.0 - 46.0 %   MCV 82.7 80.0 - 100.0 fL   MCH 27.9 26.0 - 34.0 pg   MCHC 33.7 30.0 - 36.0 g/dL   RDW 13.6 11.5 - 15.5 %   Platelets 284 150 - 400 K/uL   nRBC 0.0 0.0 - 0.2 %    Comment: Performed at [facility], [address] [place], [place] [address]  Magnesium     Status: None   Collection Time: 04/29/19  4:23 AM  Result Value Ref Range   Magnesium 1.7 1.7 - 2.4 mg/dL    Comment: Performed at [facility], [address] [place], [place] [address]  Glucose, capillary     Status: None   Collection Time: 04/29/19  8:21 AM  Result Value Ref Range   Glucose-Capillary 76 70 - 99 mg/dL  Sodium     Status: Abnormal   Collection Time: 04/29/19  8:59 AM  Result Value Ref Range   Sodium  122 (L) 135 - 145 mmol/L    Comment: Performed at [facility], [address] [place], [place] [address]  MRSA PCR Screening     Status: None   Collection Time: 04/29/19 11:13 AM   Specimen: Nasal Mucosa; Nasopharyngeal  Result Value Ref Range   MRSA by PCR NEGATIVE NEGATIVE    Comment:        The GeneXpert MRSA Assay (FDA approved for NASAL specimens only), is one component of a comprehensive MRSA colonization surveillance program. It is not intended to diagnose MRSA infection nor to guide or monitor treatment for MRSA infections. Performed at [facility], [address] [place], [place] [address]   Sodium     Status: Abnormal   Collection Time: 04/29/19  1:10 PM  Result Value Ref Range   Sodium 123 (L) 135 - 145 mmol/L    Comment: Performed at [facility], [address] [place], [place] [address]    Current Facility-Administered Medications  Medication Dose Route Frequency Provider Last Rate Last Dose  . acetaminophen (TYLENOL) tablet 650 mg  650 mg Oral Q6H PRN Harrie Foreman, MD   650 mg at 04/28/19 2232   Or  . acetaminophen (TYLENOL) suppository 650 mg  650 mg Rectal Q6H PRN Harrie Foreman, MD      . albuterol (PROVENTIL) (2.5 MG/3ML) 0.083% nebulizer solution 2.5 mg  2.5 mg Nebulization Q4H PRN Harrie Foreman, MD   2.5 mg at 04/28/19 0931  . amLODipine (NORVASC) tablet 5 mg  5 mg Oral Daily Harrie Foreman, MD   5 mg at 04/28/19 2130  . cholecalciferol (VITAMIN D) tablet 2,000 Units  2,000 Units Oral Daily Harrie Foreman, MD   2,000 Units at 04/28/19 [phone]  . citalopram (CELEXA) tablet 40 mg  40 mg Oral Daily Harrie Foreman, MD   40 mg at 04/28/19 0916   . docusate sodium (COLACE) capsule 100 mg  100 mg Oral BID Harrie Foreman, MD   100 mg at 04/28/19 2232  . enoxaparin (LOVENOX) injection 40 mg  40 mg Subcutaneous Q12H Harrie Foreman, MD   40 mg at 04/29/19 1125  . gabapentin (NEURONTIN) capsule 200 mg  200 mg Oral QID Patrecia Pour, NP   200 mg at 04/28/19 2232  . haloperidol lactate (HALDOL) 5 MG/ML injection           . hydrALAZINE (APRESOLINE) injection 10 mg  10 mg Intravenous Q6H PRN Ojie, Jude, MD      . levothyroxine (SYNTHROID) tablet 112 mcg  112 mcg Oral QAC breakfast Harrie Foreman, MD   112 mcg at 04/29/19 0445  . magnesium sulfate IVPB 2 g 50 mL  2 g Intravenous Once Ottie Glazier, MD      . metoprolol tartrate (LOPRESSOR) tablet 100 mg  100 mg Oral BID Harrie Foreman, MD   100 mg at 04/28/19 2232  . multivitamin with minerals tablet 1 tablet  1 tablet Oral Daily Harrie Foreman, MD   1 tablet at 04/28/19 [phone]  . pantoprazole (PROTONIX) injection 40 mg  40 mg Intravenous Daily Ottie Glazier, MD   40 mg at 04/29/19 1124  . predniSONE (DELTASONE) tablet 40 mg  40 mg Oral Q breakfast Harrie Foreman, MD   40 mg at 04/29/19 0844  . sodium chloride (hypertonic) 3 % solution   Intravenous Continuous Lateef, Munsoor, MD 40 mL/hr at 04/29/19 0956 40 mL/hr at 04/29/19 0956  .  sodium chloride flush (NS) 0.9 % injection 10-40 mL  10-40 mL Intracatheter Q12H Ojie, Jude, MD   10 mL at 04/29/19 1125  . sodium chloride flush (NS) 0.9 % injection 10-40 mL  10-40 mL Intracatheter PRN Ojie, Jude, MD      . tiotropium (SPIRIVA) inhalation capsule ([facility] use ONLY) 18 mcg  18 mcg Inhalation Daily Harrie Foreman, MD   18 mcg at 04/28/19 0913    Musculoskeletal: Strength & Muscle Tone: decreased Gait & Station: did not witness Patient leans: N/A  Psychiatric Specialty Exam: Physical Exam  Nursing note and vitals reviewed. Constitutional: She is oriented to person, place, and time. She appears well-developed and  well-nourished.  HENT:  Head: Normocephalic.  Neck: Normal range of motion.  Respiratory: Effort normal.  Neurological: She is alert and oriented to person, place, and time.  Psychiatric: Her speech is normal and behavior is normal. Judgment and thought content normal. Her mood appears anxious. Her affect is blunt. Cognition and memory are normal. She exhibits a depressed mood.    Review of Systems  Constitutional: Positive for malaise/fatigue.  Musculoskeletal: Positive for falls and neck pain.  Neurological: Positive for headaches.  Psychiatric/Behavioral: Positive for depression. The patient is nervous/anxious.   All other systems reviewed and are negative.   Blood pressure 135/88, pulse 80, temperature 97.9 F (36.6 C), temperature source Oral, resp. rate 15, height 4\' 10"  (1.473 m), weight 94.4 kg, SpO2 97 %.Body mass index is 43.5 kg/m.  General Appearance: Casual  Eye Contact:  poor  Speech:  slow  Volume:  low  Mood: somnolent  Affect:  Blunt  Thought Process:  Coherent and Descriptions of Associations: Intact  Orientation:  Full (Time, Place, and Person)  Thought Content:  WDL and Logical  Suicidal Thoughts:  No  Homicidal Thoughts:  No    Judgement:  impaired  Insight:  poor  Psychomotor Activity:  Decreased  Concentration:  Concentration: Fair and Attention Span: Fair  Recall:  [place] of Knowledge:  Unable to assess  Language:  Limited right now  Akathisia:  No  Handed:  Right  AIMS (if indicated):     Assets:  Housing Leisure Time Resilience Social Support  ADL's:  Impaired  Cognition:  WNL  Sleep:   Fair     Treatment Plan Summary: Daily contact with patient to assess and evaluate symptoms and progress in treatment, Medication management and Plan hallucinations, visual:  -Increase in hallucinations and increased confusion -Decrease Celexa to 20mg  , taper down completely in the next few days. Will continue to monitor.   Falls and  Hyponatremia -Decrease her gabapentin to 200 mg bid with a continue tapered off plan as this can cause hyponatremia and falls  Disposition: No evidence of imminent risk to self or others at present.   Patient does not meet criteria for psychiatric inpatient admission. Supportive therapy provided about ongoing stressors.  Elvin So, MD 04/29/2019 4:18 PM

## 2019-04-29 NOTE — Progress Notes (Signed)
PHARMACY CONSULT NOTE - FOLLOW UP  Pharmacy Consult for Electrolyte Monitoring and Replacement   Recent Labs: Potassium (mmol/L)  Date Value  04/29/2019 3.2 (L)  11/05/2014 4.0   Magnesium (mg/dL)  Date Value  04/29/2019 1.7  02/09/2014 2.2   Calcium (mg/dL)  Date Value  04/29/2019 8.3 (L)   Calcium, Total (mg/dL)  Date Value  11/05/2014 8.9   Albumin (g/dL)  Date Value  04/27/2019 4.0  11/05/2014 3.6   Phosphorus (mg/dL)  Date Value  11/06/2016 1.9 (L)   Sodium (mmol/L)  Date Value  04/29/2019 122 (L)  11/05/2014 131 (L)   Corrected calcium 8.3  Assessment: SR is a 73 year old female now on hypertonic saline for persistent hyponatremia despite fluid hydration. Pharmacy to follow along to ensure Na does not increase too rapidly.Per AM ICU rounds on 04/29/19, MD would like to replenish no more than 5mEq/L per 24 hours.   Patient was started on Hypertonic NaCL 3% solution started at 1515 on 06/29 at 30 mL/hr and her IV access was blown and patient was transferred to the ICU for monitoring.  0629 @ 1719 118 0629 @ 2143 117 0630 @ 0010 118 0630 @ 0423 120  Hypertonic NaCL 3% solution started at 0915 on 06/30 at 40 mL/hr 0630 @ 0859 122 0630 @ 1310 123  2)Potassium -potassium replenished via central line 11mEq x 4 at 1200 on 6/30  Goal of Therapy:  Na 135-145 mEq/L Potassium ~ 4 Magnesium ~ 2 Correction rate of 52mEq/L per 24 hours  Plan:  -Monitor sodium levels every 4 hours -Continue to monitor and adjust as needed -Continue to monitor and replenish K as needed  [name] ,PharmD Candidate 04/29/2019 1:14 PM

## 2019-04-29 NOTE — Progress Notes (Signed)
Pt yelling out upon arrival to room gripping anterior left of forehead. Pain ad scale of 9 out of 10. Pt audibly wheezing. Offered breathing treat pt refuse both mask and mouth piece nebulizer devices. SpO2 of 87 placed on 2 L [place]. 1 mg for agitation and hallucination  given. Continuing to monitor

## 2019-04-29 NOTE — Progress Notes (Signed)
PHARMACY CONSULT NOTE - FOLLOW UP  Pharmacy Consult for Electrolyte Monitoring and Replacement   Recent Labs: Potassium (mmol/L)  Date Value  04/29/2019 3.2 (L)  11/05/2014 4.0   Magnesium (mg/dL)  Date Value  04/29/2019 1.7  02/09/2014 2.2   Calcium (mg/dL)  Date Value  04/29/2019 8.3 (L)   Calcium, Total (mg/dL)  Date Value  11/05/2014 8.9   Albumin (g/dL)  Date Value  04/27/2019 4.0  11/05/2014 3.6   Phosphorus (mg/dL)  Date Value  11/06/2016 1.9 (L)   Sodium (mmol/L)  Date Value  04/29/2019 127 (L)  11/05/2014 131 (L)     Assessment: 6/30:  Na  0423 =  120                 0859 =  122                 1310 =  123                 1647 =  126                 2000 = 127   Goal of Therapy:  Na increase no greater than 8 mEq in 24 hrs   Plan:  Hypertonic saline currently on hold.   Will recheck Na level in 2 hrs,  Contact MD if Na continues to rise .   Orene Desanctis ,PharmD Clinical Pharmacist 04/29/2019 8:45 PM

## 2019-04-29 NOTE — Progress Notes (Signed)
Patient's sodium 126 and 3% solution stopped. MD Aleskerov Notified.

## 2019-04-29 NOTE — Consult Note (Addendum)
CRITICAL CARE NOTE      CHIEF COMPLAINT:   Lethargy, s/p head trauma post mechanical fall    SUBJECTIVE FINDINGS & SIGNIFICANT EVENTS   Lethargy - hallucination  Hyponatremia  - hypotonic euvlemic hyponatremia possibly due to SIADH secondary to SSRI with NSAIDs  Patient remains critically ill with confusion and lethargy   PAST MEDICAL HISTORY   Past Medical History:  Diagnosis Date  . Allergy   . Asthma   . Back pain   . Bronchitis   . COPD (chronic obstructive pulmonary disease) ([place])   . Degenerative disc disease, lumbar   . Depression   . FHx: cholecystectomy   . GERD (gastroesophageal reflux disease)   . H/O: hysterectomy   . Headache   . Hypertension   . Murmur   . Osteoporosis   . Pneumonia 03/31/16   being treated by PCP  . Restless legs syndrome   . Scoliosis   . Shingles   . Thyroid disease      SURGICAL HISTORY   Past Surgical History:  Procedure Laterality Date  . ABDOMINAL HYSTERECTOMY    . APPENDECTOMY    . BACK SURGERY    . CHOLECYSTECTOMY    . COLONOSCOPY WITH PROPOFOL N/A 01/16/2018   Procedure: COLONOSCOPY WITH PROPOFOL;  Surgeon: Toledo, Benay Pike, MD;  Location: [facility]COPY;  Service: Gastroenterology;  Laterality: N/A;  . ESOPHAGOGASTRODUODENOSCOPY (EGD) WITH PROPOFOL N/A 01/16/2018   Procedure: ESOPHAGOGASTRODUODENOSCOPY (EGD) WITH PROPOFOL;  Surgeon: Toledo, Benay Pike, MD;  Location: [facility] END[facility]COPY;  Service: Gastroenterology;  Laterality: N/A;  . JOINT REPLACEMENT Left 1998   shoulder  . NECK SURGERY Bilateral   . ROTATOR CUFF REPAIR Right   . SHOULDER OPEN ROTATOR CUFF REPAIR Right   . SHOULDER SURGERY Left    replacement     FAMILY HISTORY   Family History  Problem Relation Age of Onset  . Kidney disease Father   . Heart disease Mother   .  Diabetes Mother      SOCIAL HISTORY   Social History   Tobacco Use  . Smoking status: Former Smoker    Packs/day: 1.00    Years: 30.00    Pack years: 30.00    Types: Cigarettes    Quit date: 11/04/2016    Years since quitting: 2.4  . Smokeless tobacco: Never Used  Substance Use Topics  . Alcohol use: No    Alcohol/week: 0.0 standard drinks  . Drug use: No     MEDICATIONS   Current Medication:  Current Facility-Administered Medications:  .  acetaminophen (TYLENOL) tablet 650 mg, 650 mg, Oral, Q6H PRN, 650 mg at 04/28/19 2232 **OR** acetaminophen (TYLENOL) suppository 650 mg, 650 mg, Rectal, Q6H PRN, Harrie Foreman, MD .  albuterol (PROVENTIL) (2.5 MG/3ML) 0.083% nebulizer solution 2.5 mg, 2.5 mg, Nebulization, Q4H PRN, Harrie Foreman, MD, 2.5 mg at 04/28/19 0931 .  amLODipine (NORVASC) tablet 5 mg, 5 mg, Oral, Daily, Harrie Foreman, MD, 5 mg at 04/28/19 0917 .  butalbital-acetaminophen-caffeine (FIORICET) 50-325-40 MG per tablet 1 tablet, 1 tablet, Oral, Q6H PRN, Harrie Foreman, MD, 1 tablet at 04/29/19 0236 .  cholecalciferol (VITAMIN D) tablet 2,000 Units, 2,000 Units, Oral, Daily, Harrie Foreman, MD, 2,000 Units at 04/28/19 [phone]  citalopram (CELEXA) tablet 40 mg, 40 mg, Oral, Daily, Harrie Foreman, MD, 40 mg at 04/28/19 0916 .  docusate sodium (COLACE) capsule 100 mg, 100 mg, Oral, BID, Harrie Foreman, MD, 100 mg at 04/28/19 2232 .  enoxaparin (LOVENOX) injection 40 mg, 40 mg, Subcutaneous, Q12H, Harrie Foreman, MD, 40 mg at 04/28/19 2232 .  gabapentin (NEURONTIN) capsule 200 mg, 200 mg, Oral, QID, Lord, Jamison Y, NP, 200 mg at 04/28/19 2232 .  haloperidol lactate (HALDOL) 5 MG/ML injection, , , ,  .  hydrALAZINE (APRESOLINE) injection 10 mg, 10 mg, Intravenous, Q6H PRN, Ojie, Jude, MD .  levothyroxine (SYNTHROID) tablet 112 mcg, 112 mcg, Oral, QAC breakfast, Harrie Foreman, MD, 112 mcg at 04/29/19 0445 .  metoprolol tartrate (LOPRESSOR)  tablet 100 mg, 100 mg, Oral, BID, Harrie Foreman, MD, 100 mg at 04/28/19 2232 .  morphine 2 MG/ML injection 1 mg, 1 mg, Intravenous, Q4H PRN, Harrie Foreman, MD, 1 mg at 04/29/19 0533 .  multivitamin with minerals tablet 1 tablet, 1 tablet, Oral, Daily, Harrie Foreman, MD, 1 tablet at 04/28/19 [phone]  ondansetron (ZOFRAN) tablet 4 mg, 4 mg, Oral, Q6H PRN **OR** ondansetron (ZOFRAN) injection 4 mg, 4 mg, Intravenous, Q6H PRN, Harrie Foreman, MD .  pantoprazole (PROTONIX) EC tablet 40 mg, 40 mg, Oral, Daily, Harrie Foreman, MD, 40 mg at 04/28/19 0916 .  potassium chloride SA (K-DUR) CR tablet 20 mEq, 20 mEq, Oral, Daily, Harrie Foreman, MD .  predniSONE (DELTASONE) tablet 40 mg, 40 mg, Oral, Q breakfast, Harrie Foreman, MD, 40 mg at 04/29/19 0844 .  sodium chloride (hypertonic) 3 % solution, , Intravenous, Continuous **AND** hypertonic 3% sodium chloride pharmacy monitoring, , , Until Discontinued, Lateef, Munsoor, MD .  tiotropium (SPIRIVA) inhalation capsule ([facility] use ONLY) 18 mcg, 18 mcg, Inhalation, Daily, Harrie Foreman, MD, 18 mcg at 04/28/19 0913    ALLERGIES   Adhesive [tape] and Other    REVIEW OF SYSTEMS     Unable to obtain due to lethargy  PHYSICAL EXAMINATION   Vitals:   04/29/19 0730 04/29/19 0800  BP: (!) 155/137 (!) 159/92  Pulse: 73   Resp: 18   Temp: 98.4 F (36.9 C) 97.9 F (36.6 C)  SpO2: 99% 100%    GENERAL: Chronically ill-appearing confused and lethargic HEAD: Normocephalic, atraumatic.  EYES: Pupils equal, round, reactive to light.  No scleral icterus.  MOUTH: Moist mucosal membrane. NECK: Supple. No thyromegaly. No nodules. No JVD.  PULMONARY: CTAB CARDIOVASCULAR: S1 and S2. Regular rate and rhythm. No murmurs, rubs, or gallops.  GASTROINTESTINAL: Soft, nontender, non-distended. No masses. Positive bowel sounds. No hepatosplenomegaly.  MUSCULOSKELETAL: No swelling, clubbing, or edema.  NEUROLOGIC: Mild distress due  to acute illness SKIN:intact,warm,dry   LABS AND IMAGING       LAB RESULTS: Recent Labs  Lab 04/27/19 0340  04/28/19 0337  04/28/19 2143 04/29/19 0010 04/29/19 0423  NA 117*   < > 119*   < > 117* 118* 120*  K 3.8  --  3.2*  --   --   --  3.2*  CL 83*  --  84*  --   --   --  87*  CO2 23  --  26  --   --   --  26  BUN 8  --  6*  --   --   --  5*  CREATININE 0.39*  --  0.39*  --   --   --  0.34*  GLUCOSE 104*  --  98  --   --   --  88   < > = values in this interval not displayed.   Recent Labs  Lab 04/27/19 0340  04/28/19 0337 04/29/19 0423  HGB 12.4 12.0 11.1*  HCT 36.3 34.9* 32.9*  WBC 8.2 9.1 8.5  PLT 287 293 284     IMAGING RESULTS: [name] Ekg Site Rite  Result Date: 04/28/2019 If Site Rite image not attached, placement could not be confirmed due to current cardiac rhythm.     ASSESSMENT AND PLAN    -Multidisciplinary rounds held today  Altered mental status with confusion and lethargy -Due to severe hyponatremia - treatment as per hyponatremia protocol   Severe hypotonic euvolemic hyponatremia  -TSH normal  - no renal failure  - no thiazide use  - cortisol normal   - no known intracranial pathology to suspect reset osmostat  - non potomania induced   - suspect SIADH evidenced by decreased BUN with multiple meds on home MAR known to cause hyponatremia  - CT chest no lung mass - small area of rounded atelectasis in L apex and incidentally found large hiatal hernia with mass effect displacing aorta to right as well as extrinsically compressing LLL basal segments with resultant mild atelectasis.    CARDIAC FAILURE -oxygen as needed -Lasix as tolerated -follow up cardiac enzymes as indicated ICU monitoring  Renal Failure-most likely due to ATN -follow chem 7 -follow UO -continue Foley Catheter-assess need daily   NEUROLOGY - intubated and sedated - minimal sedation to achieve a RASS goal: -1 Wake up assessment pending   Septic shock -use  vasopressors to keep MAP>65 -follow ABG and LA -follow up cultures -emperic ABX -consider stress dose steroids   ID -continue IV abx as prescibed -follow up cultures  GI/Nutrition GI PROPHYLAXIS as indicated DIET-->TF's as tolerated Constipation protocol as indicated  ENDO - ICU hypoglycemic\Hyperglycemia protocol -check FSBS per protocol   ELECTROLYTES -follow labs as needed -replace as needed -pharmacy consultation   DVT/GI PRX ordered -SCDs  TRANSFUSIONS AS NEEDED MONITOR FSBS ASSESS the need for LABS as needed   Critical care provider statement:    Critical care time (minutes):  109   Critical care time was exclusive of:  Separately billable procedures and treating other patients   Critical care was necessary to treat or prevent imminent or life-threatening deterioration of the following conditions:  severe hyponatremia, altered mental status with lethargy, obesity, hiatal hernia, multiple comorbid conditions.    Critical care was time spent personally by me on the following activities:  Development of treatment plan with patient or surrogate, discussions with consultants, evaluation of patient's response to treatment, examination of patient, obtaining history from patient or surrogate, ordering and performing treatments and interventions, ordering and review of laboratory studies and re-evaluation of patient's condition.  I assumed direction of critical care for this patient from another provider in my specialty: no    This document was prepared using Dragon voice recognition software and may include unintentional dictation errors.    Ottie Glazier, M.D.  Division of [facility]

## 2019-04-29 NOTE — Progress Notes (Signed)
Peripherally Inserted Central Catheter/Midline Placement  The IV Nurse has discussed with the patient and/or persons authorized to consent for the patient, the purpose of this procedure and the potential benefits and risks involved with this procedure.  The benefits include less needle sticks, lab draws from the catheter, and the patient may be discharged home with the catheter. Risks include, but not limited to, infection, bleeding, blood clot (thrombus formation), and puncture of an artery; nerve damage and irregular heartbeat and possibility to perform a PICC exchange if needed/ordered by physician.  Alternatives to this procedure were also discussed.  Bard Power PICC patient education guide, fact sheet on infection prevention and patient information card has been provided to patient /or left at bedside.    PICC/Midline Placement Documentation        Darlyn Read 04/29/2019, 10:41 AM

## 2019-04-29 NOTE — Progress Notes (Signed)
[facility]  ROUNDING NOTE   Subjective:  Patient was started on 3% saline yesterday. Serum sodium now up to 122. Moved over to the intensive care unit given 3% saline.   Objective:  Vital signs in last 24 hours:  Temp:  [97.9 F (36.6 C)-98.7 F (37.1 C)] 97.9 F (36.6 C) (06/30 0800) Pulse Rate:  [70-79] 70 (06/30 1000) Resp:  [15-20] 15 (06/30 1000) BP: (115-159)/(90-137) 115/90 (06/30 1000) SpO2:  [95 %-100 %] 100 % (06/30 0800) Weight:  [94 kg-94.4 kg] 94.4 kg (06/30 0800)  Weight change: -0.5 kg Filed Weights   04/28/19 0500 04/29/19 0445 04/29/19 0800  Weight: 94.5 kg 94 kg 94.4 kg    Intake/Output: I/O last 3 completed shifts: In: 3375.2 [P.O.:480; I.V.:2895.2] Out: -    Intake/Output this shift:  Total I/O In: 289 [I.V.:289] Out: 400 [Urine:400]  Physical Exam: General: No acute distress  Head: Normocephalic, atraumatic. Moist oral mucosal membranes  Eyes: Anicteric  Neck: Supple, trachea midline  Lungs:  Bilateral wheezing  Heart: S1S2 no rubs  Abdomen:  Soft, nontender, bowel sounds present  Extremities: Trace peripheral edema.  Neurologic: confused  Skin: No lesions       Basic Metabolic Panel: Recent Labs  Lab 04/25/19 1149 04/27/19 0340  04/28/19 0337  04/28/19 1719 04/28/19 2143 04/29/19 0010 04/29/19 0423 04/29/19 0859  NA 126* 117*   < > 119*   < > 118* 117* 118* 120* 122*  K 5.1 3.8  --  3.2*  --   --   --   --  3.2*  --   CL 92* 83*  --  84*  --   --   --   --  87*  --   CO2 26 23  --  26  --   --   --   --  26  --   GLUCOSE 105* 104*  --  98  --   --   --   --  88  --   BUN 15 8  --  6*  --   --   --   --  5*  --   CREATININE 0.69 0.39*  --  0.39*  --   --   --   --  0.34*  --   CALCIUM 9.2 8.8*  --  8.4*  --   --   --   --  8.3*  --   MG  --   --   --  1.9  --   --   --   --  1.7  --    < > = values in this interval not displayed.    Liver Function Tests: Recent Labs  Lab 04/25/19 1149 04/27/19 0340  AST  22 24  ALT 15 18  ALKPHOS 66 78  BILITOT 0.5 0.6  PROT 7.2 7.3  ALBUMIN 3.8 4.0   No results for input(s): LIPASE, AMYLASE in the last 168 hours. No results for input(s): AMMONIA in the last 168 hours.  CBC: Recent Labs  Lab 04/25/19 1149 04/27/19 0340 04/28/19 0337 04/29/19 0423  WBC 10.9* 8.2 9.1 8.5  HGB 12.6 12.4 12.0 11.1*  HCT 39.3 36.3 34.9* 32.9*  MCV 86.6 81.8 81.4 82.7  PLT 305 287 293 284    Cardiac Enzymes: No results for input(s): CKTOTAL, CKMB, CKMBINDEX, TROPONINI in the last 168 hours.  BNP: Invalid input(s): POCBNP  CBG: Recent Labs  Lab 04/29/19 0821  GLUCAP 76  Microbiology: Results for orders placed or performed during the hospital encounter of 04/27/19  SARS Coronavirus 2 (CEPHEID - Performed in [place] hospital lab), Hosp Order     Status: None   Collection Time: 04/27/19  3:19 AM   Specimen: Nasopharyngeal Swab  Result Value Ref Range Status   SARS Coronavirus 2 NEGATIVE NEGATIVE Final    Comment: (NOTE) If result is NEGATIVE SARS-CoV-2 target nucleic acids are NOT DETECTED. The SARS-CoV-2 RNA is generally detectable in upper and lower  respiratory specimens during the acute phase of infection. The lowest  concentration of SARS-CoV-2 viral copies this assay can detect is 250  copies / mL. A negative result does not preclude SARS-CoV-2 infection  and should not be used as the sole basis for treatment or other  patient management decisions.  A negative result may occur with  improper specimen collection / handling, submission of specimen other  than nasopharyngeal swab, presence of viral mutation(s) within the  areas targeted by this assay, and inadequate number of viral copies  (<250 copies / mL). A negative result must be combined with clinical  observations, patient history, and epidemiological information. If result is POSITIVE SARS-CoV-2 target nucleic acids are DETECTED. The SARS-CoV-2 RNA is generally detectable in upper  and lower  respiratory specimens dur ing the acute phase of infection.  Positive  results are indicative of active infection with SARS-CoV-2.  Clinical  correlation with patient history and other diagnostic information is  necessary to determine patient infection status.  Positive results do  not rule out bacterial infection or co-infection with other viruses. If result is PRESUMPTIVE POSTIVE SARS-CoV-2 nucleic acids MAY BE PRESENT.   A presumptive positive result was obtained on the submitted specimen  and confirmed on repeat testing.  While 2019 novel coronavirus  (SARS-CoV-2) nucleic acids may be present in the submitted sample  additional confirmatory testing may be necessary for epidemiological  and / or clinical management purposes  to differentiate between  SARS-CoV-2 and other Sarbecovirus currently known to infect humans.  If clinically indicated additional testing with an alternate test  methodology [phone]) is advised. The SARS-CoV-2 RNA is generally  detectable in upper and lower respiratory sp ecimens during the acute  phase of infection. The expected result is Negative. Fact Sheet for Patients:  StrictlyIdeas.no Fact Sheet for Healthcare Providers: BankingDealers.co.za This test is not yet approved or cleared by the [place] FDA and has been authorized for detection and/or diagnosis of SARS-CoV-2 by FDA under an Emergency Use Authorization (EUA).  This EUA will remain in effect (meaning this test can be used) for the duration of the COVID-19 declaration under Section 564(b)(1) of the Act, 21 U.S.C. section 360bbb-3(b)(1), unless the authorization is terminated or revoked sooner. Performed at [facility], [address] [place], [place] [address]   MRSA PCR Screening     Status: None   Collection Time: 04/29/19 11:13 AM   Specimen: Nasal Mucosa; Nasopharyngeal  Result Value Ref Range Status   MRSA by  PCR NEGATIVE NEGATIVE Final    Comment:        The GeneXpert MRSA Assay (FDA approved for NASAL specimens only), is one component of a comprehensive MRSA colonization surveillance program. It is not intended to diagnose MRSA infection nor to guide or monitor treatment for MRSA infections. Performed at [facility], [address] [place], [place] [address]     Coagulation Studies: No results for input(s): LABPROT, INR in the last 72 hours.  Urinalysis: Recent Labs    04/27/19 0033  COLORURINE YELLOW*  LABSPEC 1.014  PHURINE 7.0  GLUCOSEU NEGATIVE  HGBUR NEGATIVE  BILIRUBINUR NEGATIVE  KETONESUR 20*  PROTEINUR NEGATIVE  NITRITE NEGATIVE  LEUKOCYTESUR NEGATIVE      Imaging: Ct Chest W Contrast  Result Date: 04/29/2019 CLINICAL DATA:  Hyponatremia.  Possible SIADH. EXAM: CT CHEST WITH CONTRAST TECHNIQUE: Multidetector CT imaging of the chest was performed during intravenous contrast administration. CONTRAST:  75mL OMNIPAQUE IOHEXOL 300 MG/ML  SOLN COMPARISON:  Chest x-ray dated 04/27/2019 and CT scan of the chest dated 09/27/2018 FINDINGS: Cardiovascular: No significant vascular findings. Normal heart size. No pericardial effusion. Aortic atherosclerosis. Mediastinum/Nodes: No adenopathy. Large chronic hiatal hernia. Trachea is normal. Thyroid gland is obscured by metallic artifact from anterior cervical fusion devices. Lungs/Pleura: There is a 7 mm area of irregular parenchymal density at the left apex which is new since the prior study. There is also a focal area of parenchymal scarring in the left upper lobe which appears less prominent than on the prior exam. Upper Abdomen: No acute abnormality.  Cholecystectomy. Musculoskeletal: No acute abnormalities. Old compression fracture of T11. The local lumbar scoliosis. IMPRESSION: 1. Small focal inflammatory changes in the left lung apex. 2. No evidence of malignancy in the lungs. 3.  Aortic Atherosclerosis (ICD10-I70.0).  4. Large chronic hiatal hernia. Electronically Signed   By: Lorriane Shire M.D.   On: 04/29/2019 09:38   [name] Ekg Site Rite  Result Date: 04/28/2019 If Site Rite image not attached, placement could not be confirmed due to current cardiac rhythm.    Medications:   . magnesium sulfate bolus IVPB    . potassium chloride 10 mEq (04/29/19 1200)  . sodium chloride (hypertonic) 40 mL/hr (04/29/19 0956)   . amLODipine  5 mg Oral Daily  . cholecalciferol  2,000 Units Oral Daily  . citalopram  40 mg Oral Daily  . docusate sodium  100 mg Oral BID  . enoxaparin (LOVENOX) injection  40 mg Subcutaneous Q12H  . gabapentin  200 mg Oral QID  . haloperidol lactate      . levothyroxine  112 mcg Oral QAC breakfast  . metoprolol tartrate  100 mg Oral BID  . multivitamin with minerals  1 tablet Oral Daily  . pantoprazole (PROTONIX) IV  40 mg Intravenous Daily  . predniSONE  40 mg Oral Q breakfast  . sodium chloride flush  10-40 mL Intracatheter Q12H  . tiotropium  18 mcg Inhalation Daily   acetaminophen **OR** acetaminophen, albuterol, hydrALAZINE, sodium chloride flush  Assessment/ Plan:  73 y.o. female  with a PMHx of COPD, depression, scoliosis, restless leg syndrome, GERD, degenertive disc disease, hypertension, osteoporosis, who was admitted to [facility] on 04/27/2019 for evaluation of altered mental status.   1.  Hyponatremia. 2.  Altered mental status. 3.  COPD.    Plan: The patient is responding to 3% saline.  Serum sodium up to 122.  3% saline infusion rate was increased to 40 cc/h.  Continue to monitor serum sodium closely.  CT chest was performed and was negative for malignancy.  However there was left upper lobe focal infiltrate.  Consider antibiotic use but defer to hospitalist.    LOS: 2 Slayden Mennenga 6/30/202012:58 PM

## 2019-04-29 NOTE — Progress Notes (Signed)
[place] [facility] [place] NAME: [name]    MR#:  [id number]  DATE OF BIRTH:  1946/04/03  SUBJECTIVE:  CHIEF COMPLAINT:   Chief Complaint  Patient presents with   Headache   Altered Mental Status   Patient was started on 3% saline by nephrology yesterday.  Overnight patient reported to have been having episodes of confusion and hallucinations.  Transient.  Of dropping oxygen saturation.  Was transferred to ICU already this morning prior to my arrival.  Had a PICC line placed this morning.  REVIEW OF SYSTEMS:  Review of Systems  Constitutional: Negative for chills and fever.  HENT: Negative for hearing loss and tinnitus.   Eyes: Negative for blurred vision and double vision.  Respiratory: Negative for cough and shortness of breath.   Cardiovascular: Negative for chest pain and palpitations.  Gastrointestinal: Negative for heartburn, nausea and vomiting.  Genitourinary: Negative for dysuria and urgency.  Musculoskeletal: Negative for myalgias and neck pain.  Skin: Negative for itching and rash.  Neurological: Negative for dizziness and headaches.  Psychiatric/Behavioral: Negative for depression.       Confusion appears improved    DRUG ALLERGIES:   Allergies  Allergen Reactions   Adhesive [Tape] Other (See Comments)   Other Rash    Plastic tape   VITALS:  Blood pressure 115/90, pulse 70, temperature 97.9 F (36.6 C), temperature source Oral, resp. rate 15, height 4\' 10"  (1.473 m), weight 94.4 kg, SpO2 100 %. PHYSICAL EXAMINATION:   Physical Exam  Constitutional: She is oriented to person, place, and time. She appears well-developed and well-nourished.  HENT:  Head: Normocephalic and atraumatic.  Right Ear: External ear normal.  Eyes: Pupils are equal, round, and reactive to light. Conjunctivae are normal. Right eye exhibits no discharge.  Neck: Normal range of motion. Neck supple. No thyromegaly present.    Cardiovascular: Normal rate, regular rhythm and normal heart sounds.  Respiratory: Effort normal and breath sounds normal. She has no wheezes.  GI: Soft. Bowel sounds are normal. She exhibits no distension.  Musculoskeletal: Normal range of motion.        General: No edema.  Neurological: She is alert and oriented to person, place, and time.  Moving all extremities with no focal deficit  Skin: Skin is warm. She is not diaphoretic. No erythema.  Psychiatric: She has a normal mood and affect. Her behavior is normal.   LABORATORY PANEL:  Female CBC Recent Labs  Lab 04/29/19 0423  WBC 8.5  HGB 11.1*  HCT 32.9*  PLT 284   ------------------------------------------------------------------------------------------------------------------ Chemistries  Recent Labs  Lab 04/27/19 0340  04/29/19 0423 04/29/19 0859  NA 117*   < > 120* 122*  K 3.8   < > 3.2*  --   CL 83*   < > 87*  --   CO2 23   < > 26  --   GLUCOSE 104*   < > 88  --   BUN 8   < > 5*  --   CREATININE 0.39*   < > 0.34*  --   CALCIUM 8.8*   < > 8.3*  --   MG  --    < > 1.7  --   AST 24  --   --   --   ALT 18  --   --   --   ALKPHOS 78  --   --   --   BILITOT 0.6  --   --   --    < > =  values in this interval not displayed.   RADIOLOGY:  Ct Chest W Contrast  Result Date: 04/29/2019 CLINICAL DATA:  Hyponatremia.  Possible SIADH. EXAM: CT CHEST WITH CONTRAST TECHNIQUE: Multidetector CT imaging of the chest was performed during intravenous contrast administration. CONTRAST:  60mL OMNIPAQUE IOHEXOL 300 MG/ML  SOLN COMPARISON:  Chest x-ray dated 04/27/2019 and CT scan of the chest dated 09/27/2018 FINDINGS: Cardiovascular: No significant vascular findings. Normal heart size. No pericardial effusion. Aortic atherosclerosis. Mediastinum/Nodes: No adenopathy. Large chronic hiatal hernia. Trachea is normal. Thyroid gland is obscured by metallic artifact from anterior cervical fusion devices. Lungs/Pleura: There is a 7 mm area of  irregular parenchymal density at the left apex which is new since the prior study. There is also a focal area of parenchymal scarring in the left upper lobe which appears less prominent than on the prior exam. Upper Abdomen: No acute abnormality.  Cholecystectomy. Musculoskeletal: No acute abnormalities. Old compression fracture of T11. The local lumbar scoliosis. IMPRESSION: 1. Small focal inflammatory changes in the left lung apex. 2. No evidence of malignancy in the lungs. 3.  Aortic Atherosclerosis (ICD10-I70.0). 4. Large chronic hiatal hernia. Electronically Signed   By: Lorriane Shire M.D.   On: 04/29/2019 09:38   [name] Ekg Site Rite  Result Date: 04/28/2019 If Site Rite image not attached, placement could not be confirmed due to current cardiac rhythm.  ASSESSMENT AND PLAN:   1.  Hyponatremia: No seizure. Reported to have intermittent confusion and hallucinations Sodium level was 117 on presentation.  Evaluated by nephrologist.  Work-up so far suggestive of underlying SIADH.   Patient started on 3% saline yesterday.  Sodium level is improved to 122 this morning.  CT chest to rule out underlying lung mass done revealed small focal inflammatory changes in the left lung apex but no evidence of malignancy in the lungs. Patient transferred to the ICU early hours of this morning due to intermittent hallucinations and patient requiring 3% saline according to report from nursing staff. Serum osmolality of 245.  Urine osmolality of 335.  Urine sodium of 98.  TSH level normal.  Cortisol level of 37.2. CT scan of the head without acute findings.  Clinically no evidence of infectious process found.  2.  Hallucinations Patient was seen by psychiatry service due to hallucinations.  Notified by nursing staff.IVC was initiated on admission due to hallucinations as well. Dose of gabapentin decreased from 300 mg 4 times daily to 200 mg 4 times daily with plans to gradually taper off since this can cause  hyponatremia.  To continue Celexa for depression and anxiety.  Celexa can contribute to hyponatremia.  Follow-up with psych regarding alternative treatment or tapering dose. I discussed with patient's granddaughter; Monts on 04/28/2019 who lives with the patient at home and she confirmed she took out the IVC papers because over the last 3 months patient has been progressively getting more confused and having recurrent falls at home.  Family having difficulty with caring for patient at home as well. I updated granddaughter as well as patient and spouse this morning on change in patient's condition overnight and transferred to ICU this morning  3.  Hypertension: Intermittently controlled.  Continue metoprolol and amlodipine.  Placed on PRN IV hydralazine with parameters  4.  Hypothyroidism: TSH level normal.  continue Synthroid  5.  Falls: Fall precautions. CT of the head with no acute findings. PT and OT evaluation requested Patient with most likely benefit from skilled nursing facility placement on discharge from  the hospital.    DVT prophylaxis: Lovenox   All the records are reviewed and case discussed with Care Management/Social Worker. Management plans discussed with the patient, family and they are in agreement.  CODE STATUS: Full Code  TOTAL TIME TAKING CARE OF THIS PATIENT: 39 minutes.   More than 50% of the time was spent in counseling/coordination of care: YES  POSSIBLE D/C IN 2 DAYS, DEPENDING ON CLINICAL CONDITION.   Zylon Creamer M.D on 04/29/2019 at 11:28 AM  Between 7am to 6pm - Pager - [phone]  After 6pm go to [email or web address] - Proofreader  [facility]  Office  [phone]  CC: Primary care physician; Baxter Hire, MD  Note: This dictation was prepared with Dragon dictation along with smaller phrase technology. Any transcriptional errors that result from this process are unintentional.

## 2019-04-30 DIAGNOSIS — R441 Visual hallucinations: Secondary | ICD-10-CM

## 2019-04-30 LAB — BASIC METABOLIC PANEL
Anion gap: 11 (ref 5–15)
BUN: 7 mg/dL — ABNORMAL LOW (ref 8–23)
CO2: 25 mmol/L (ref 22–32)
Calcium: 8.5 mg/dL — ABNORMAL LOW (ref 8.9–10.3)
Chloride: 91 mmol/L — ABNORMAL LOW (ref 98–111)
Creatinine, Ser: 0.51 mg/dL (ref 0.44–1.00)
GFR calc Af Amer: >60 mL/min (ref >60)
GFR calc non Af Amer: >60 mL/min (ref >60)
Glucose, Bld: 82 mg/dL (ref 70–99)
Potassium: 3.4 mmol/L — ABNORMAL LOW (ref 3.5–5.1)
Sodium: 127 mmol/L — ABNORMAL LOW (ref 135–145)

## 2019-04-30 LAB — CBC
HCT: 35.7 % — ABNORMAL LOW (ref 36.0–46.0)
Hemoglobin: 11.8 g/dL — ABNORMAL LOW (ref 12.0–15.0)
MCH: 27.9 pg (ref 26.0–34.0)
MCHC: 33.1 g/dL (ref 30.0–36.0)
MCV: 84.4 fL (ref 80.0–100.0)
Platelets: 334 10*3/uL (ref 150–400)
RBC: 4.23 MIL/uL (ref 3.87–5.11)
RDW: 14.4 % (ref 11.5–15.5)
WBC: 9.9 10*3/uL (ref 4.0–10.5)
nRBC: 0 % (ref 0.0–0.2)

## 2019-04-30 LAB — MAGNESIUM: Magnesium: 2.5 mg/dL — ABNORMAL HIGH (ref 1.7–2.4)

## 2019-04-30 LAB — SODIUM
Sodium: 127 mmol/L — ABNORMAL LOW (ref 135–145)
Sodium: 127 mmol/L — ABNORMAL LOW (ref 135–145)
Sodium: 128 mmol/L — ABNORMAL LOW (ref 135–145)
Sodium: 127 mmol/L — ABNORMAL LOW (ref 135–145)

## 2019-04-30 LAB — PHOSPHORUS: Phosphorus: 2.2 mg/dL — ABNORMAL LOW (ref 2.5–4.6)

## 2019-04-30 MED ORDER — POTASSIUM CHLORIDE CRYS ER 20 MEQ PO TBCR
40.0000 meq | EXTENDED_RELEASE_TABLET | Freq: Once | ORAL | Status: AC
  Administered 2019-04-30: 11:00:00 40 meq via ORAL
  Filled 2019-04-30: qty 2

## 2019-04-30 MED ORDER — SODIUM CHLORIDE 1 G PO TABS
1.0000 g | ORAL_TABLET | Freq: Three times a day (TID) | ORAL | Status: DC
  Administered 2019-04-30 – 2019-05-05 (×16): 1 g via ORAL
  Filled 2019-04-30 (×17): qty 1

## 2019-04-30 MED ORDER — SODIUM CHLORIDE 3 % IV SOLN
INTRAVENOUS | Status: DC
  Filled 2019-04-30 (×3): qty 500

## 2019-04-30 NOTE — Progress Notes (Signed)
PHARMACY CONSULT NOTE - FOLLOW UP  Pharmacy Consult for Electrolyte Monitoring and Replacement   Recent Labs: Potassium (mmol/L)  Date Value  04/29/2019 3.2 (L)  11/05/2014 4.0   Magnesium (mg/dL)  Date Value  04/29/2019 1.7  02/09/2014 2.2   Calcium (mg/dL)  Date Value  04/29/2019 8.3 (L)   Calcium, Total (mg/dL)  Date Value  11/05/2014 8.9   Albumin (g/dL)  Date Value  04/27/2019 4.0  11/05/2014 3.6   Phosphorus (mg/dL)  Date Value  11/06/2016 1.9 (L)   Sodium (mmol/L)  Date Value  04/29/2019 127 (L)  11/05/2014 131 (L)     Assessment: 6/30:  Na  0423 =  120                 0859 =  122                 1310 =  123                 1647 =  126                  2000 = 127       2320 = 127  Goal of Therapy:  Na increase no greater than 8 mEq in 24 hrs   Plan:  07/01 @ 2320 Na still steady @ 127. 3% saline currently held since Na has gone up 10 mEq/L in the past 24 hours. Will continue to monitor.  Tobie Lords ,PharmD Clinical Pharmacist 04/30/2019 1:02 AM

## 2019-04-30 NOTE — Progress Notes (Signed)
Pharmacy Electrolyte Monitoring Consult:  Pharmacy consulted to assist in monitoring and replacing electrolytes in this 73 y.o. female admitted on 04/27/2019 with Headache and Altered Mental Status   Labs:  Sodium (mmol/L)  Date Value  04/30/2019 127 (L)  11/05/2014 131 (L)   Potassium (mmol/L)  Date Value  04/30/2019 3.4 (L)  11/05/2014 4.0   Magnesium (mg/dL)  Date Value  04/30/2019 2.5 (H)  02/09/2014 2.2   Phosphorus (mg/dL)  Date Value  04/30/2019 2.2 (L)   Calcium (mg/dL)  Date Value  04/30/2019 8.5 (L)   Calcium, Total (mg/dL)  Date Value  11/05/2014 8.9   Albumin (g/dL)  Date Value  04/27/2019 4.0  11/05/2014 3.6    Assessment/Plan: Sodium tablets 1 tab TIDWM started. Hypertonic saline ordered at 59mL/hr. Not documented as infusing.   Per ICU rounds max sodium correction in 24 hours is 43mmol/L in 24 hours.   Potassium 42mEq PO x 1 given this am. Will order potassium   Pharmacy will continue to monitor and adjust per consult.   [name] L 04/30/2019 3:05 PM

## 2019-04-30 NOTE — Progress Notes (Signed)
PHARMACY CONSULT NOTE - FOLLOW UP  Pharmacy Consult for Electrolyte Monitoring and Replacement   Recent Labs: Potassium (mmol/L)  Date Value  04/30/2019 3.4 (L)  11/05/2014 4.0   Magnesium (mg/dL)  Date Value  04/29/2019 1.7  02/09/2014 2.2   Calcium (mg/dL)  Date Value  04/30/2019 8.5 (L)   Calcium, Total (mg/dL)  Date Value  11/05/2014 8.9   Albumin (g/dL)  Date Value  04/27/2019 4.0  11/05/2014 3.6   Phosphorus (mg/dL)  Date Value  11/06/2016 1.9 (L)   Sodium (mmol/L)  Date Value  04/30/2019 127 (L)  11/05/2014 131 (L)     Assessment: 6/30:  Na  0423 =  120                 0859 =  122                 1310 =  123                 1647 =  126                  2000 = 127       2320 = 127 7/01: Na   0530 = 127  Goal of Therapy:  Na increase no greater than 8 mEq in 24 hrs   Plan:  Na still steady @ 127. 3% saline currently held since Na has gone up 10 mEq/L in the past 24 hours. Will continue to monitor. Recommend to start NS if Na starts to go back down.  Tobie Lords ,PharmD Clinical Pharmacist 04/30/2019 6:57 AM

## 2019-04-30 NOTE — Progress Notes (Signed)
[place] at [place] NAME: [name]    MR#:  [id number]  DATE OF BIRTH:  1946-10-13  SUBJECTIVE:  CHIEF COMPLAINT:   Chief Complaint  Patient presents with  . Headache  . Altered Mental Status   No new complaint this morning.  Patient awake and alert this morning.  3% saline discontinued yesterday due to improvement in hyponatremia. Notified about plans for transfer out of ICU today.  REVIEW OF SYSTEMS:  Review of Systems  Constitutional: Negative for chills and fever.  HENT: Negative for hearing loss and tinnitus.   Eyes: Negative for blurred vision and double vision.  Respiratory: Negative for cough and shortness of breath.   Cardiovascular: Negative for chest pain and palpitations.  Gastrointestinal: Negative for heartburn, nausea and vomiting.  Genitourinary: Negative for dysuria and urgency.  Musculoskeletal: Negative for myalgias and neck pain.  Skin: Negative for itching and rash.  Neurological: Negative for dizziness and headaches.  Psychiatric/Behavioral: Negative for depression.       Confusion appears improved    DRUG ALLERGIES:   Allergies  Allergen Reactions  . Adhesive [Tape] Other (See Comments)  . Other Rash    Plastic tape   VITALS:  Blood pressure (!) 143/102, pulse 75, temperature 97.9 F (36.6 C), temperature source Oral, resp. rate 16, height 4\' 10"  (1.473 m), weight 93.9 kg, SpO2 97 %. PHYSICAL EXAMINATION:   Physical Exam  Constitutional: She is oriented to person, place, and time. She appears well-developed and well-nourished.  HENT:  Head: Normocephalic and atraumatic.  Right Ear: External ear normal.  Eyes: Pupils are equal, round, and reactive to light. Conjunctivae are normal. Right eye exhibits no discharge.  Neck: Normal range of motion. Neck supple. No thyromegaly present.  Cardiovascular: Normal rate, regular rhythm and normal heart sounds.  Respiratory: Effort normal and breath  sounds normal. She has no wheezes.  GI: Soft. Bowel sounds are normal. She exhibits no distension.  Musculoskeletal: Normal range of motion.        General: No edema.  Neurological: She is alert and oriented to person, place, and time.  Moving all extremities with no focal deficit  Skin: Skin is warm. She is not diaphoretic. No erythema.  Psychiatric: She has a normal mood and affect. Her behavior is normal.   LABORATORY PANEL:  Female CBC Recent Labs  Lab 04/30/19 0531  WBC 9.9  HGB 11.8*  HCT 35.7*  PLT 334   ------------------------------------------------------------------------------------------------------------------ Chemistries  Recent Labs  Lab 04/27/19 0340  04/30/19 0000 04/30/19 0531 04/30/19 1301  NA 117*   < >  --  127* 127*  K 3.8   < >  --  3.4*  --   CL 83*   < >  --  91*  --   CO2 23   < >  --  25  --   GLUCOSE 104*   < >  --  82  --   BUN 8   < >  --  7*  --   CREATININE 0.39*   < >  --  0.51  --   CALCIUM 8.8*   < >  --  8.5*  --   MG  --    < > 2.5*  --   --   AST 24  --   --   --   --   ALT 18  --   --   --   --   [facility]  78  --   --   --   --   BILITOT 0.6  --   --   --   --    < > = values in this interval not displayed.   RADIOLOGY:  No results found. ASSESSMENT AND PLAN:   1.  Hyponatremia: No seizure. Reported to have intermittent confusion and hallucinations Sodium level was 117 on presentation.  Evaluated by nephrologist.  Work-up so far suggestive of underlying SIADH.   Patient started on 3% saline previously.  3% saline discontinued yesterday due to improvement in sodium level to 127.  The improvement was over the last several days however.  Sodium level remains stable at 127 today. CT chest to rule out underlying lung mass done revealed small focal inflammatory changes in the left lung apex but no evidence of malignancy in the lungs. Serum osmolality of 245.  Urine osmolality of 335.  Urine sodium of 98.  TSH level normal.  Cortisol  level of 37.2. CT scan of the head without acute findings.  Clinically no evidence of infectious process found.  2.  Acute metabolic encephalopathy; Likely related to hyponatremia.  Patient was having intermittent hallucinations.  Hallucinations Patient was seen by psychiatry service due to hallucinations.  Notified by nursing staff.IVC was initiated on admission due to hallucinations as well. Psychiatric service gradually tapering off Celexa and gabapentin which can cause hyponatremia.  Appreciate psychiatry input.  I updated patient's husband and granddaughter; [place] recently who lives with the patient at home and she confirmed she took out the IVC papers because over the last 3 months patient has been progressively getting more confused and having recurrent falls at home.  Family having difficulty with caring for patient at home as well. Patient would likely benefit from skilled nursing facility placement on discharge  3.  Hypertension: Intermittently controlled.  Continue metoprolol and amlodipine.  Placed on PRN IV hydralazine with parameters  4.  Hypothyroidism: TSH level normal.  continue Synthroid  5.  Falls: Fall precautions. CT of the head with no acute findings. PT and OT evaluation requested Patient with most likely benefit from skilled nursing facility placement on discharge from the hospital.    DVT prophylaxis: Lovenox  All the records are reviewed and case discussed with Care Management/Social Worker. Management plans discussed with the patient, family and they are in agreement.  CODE STATUS: Full Code  TOTAL TIME TAKING CARE OF THIS PATIENT: 37 minutes.   More than 50% of the time was spent in counseling/coordination of care: YES  POSSIBLE D/C IN 2 DAYS, DEPENDING ON CLINICAL CONDITION.   Emmalyn Hinson M.D on 04/30/2019 at 2:04 PM  Between 7am to 6pm - Pager - [phone]  After 6pm go to [email or web address] - Proofreader  [facility]   Office  [phone]  CC: Primary care physician; Baxter Hire, MD  Note: This dictation was prepared with Dragon dictation along with smaller phrase technology. Any transcriptional errors that result from this process are unintentional.

## 2019-04-30 NOTE — Progress Notes (Signed)
[facility]  ROUNDING NOTE   Subjective:  Serum sodium currently 127. 3% saline was resumed at 35 cc/h earlier this a.m. Patient a bit more awake and alert today.  Objective:  Vital signs in last 24 hours:  Temp:  [97.9 F (36.6 C)-98.5 F (36.9 C)] 97.9 F (36.6 C) (07/01 1200) Pulse Rate:  [73-97] 80 (07/01 1500) Resp:  [14-18] 14 (07/01 1500) BP: (110-152)/(73-114) 138/92 (07/01 1500) SpO2:  [93 %-97 %] 97 % (07/01 1500) Weight:  [93.9 kg] 93.9 kg (07/01 0500)  Weight change: 0.4 kg Filed Weights   04/29/19 0445 04/29/19 0800 04/30/19 0500  Weight: 94 kg 94.4 kg 93.9 kg    Intake/Output: I/O last 3 completed shifts: In: 1399.7 [I.V.:1106.4; IV Piggyback:293.3] Out: 1500 [Urine:1500]   Intake/Output this shift:  Total I/O In: 0  Out: 175 [Urine:175]  Physical Exam: General: No acute distress  Head: Normocephalic, atraumatic. Moist oral mucosal membranes  Eyes: Anicteric  Neck: Supple, trachea midline  Lungs:  Bilateral wheezing  Heart: S1S2 no rubs  Abdomen:  Soft, nontender, bowel sounds present  Extremities: Trace peripheral edema.  Neurologic:  Awake but confused at times.  Skin: No lesions       Basic Metabolic Panel: Recent Labs  Lab 04/25/19 1149 04/27/19 0340  04/28/19 0337  04/29/19 0423  04/29/19 1647 04/29/19 2000 04/29/19 2320 04/30/19 0000 04/30/19 0531 04/30/19 1301  NA 126* 117*   < > 119*   < > 120*   < > 126* 127* 127*  --  127* 127*  K 5.1 3.8  --  3.2*  --  3.2*  --   --   --   --   --  3.4*  --   CL 92* 83*  --  84*  --  87*  --   --   --   --   --  91*  --   CO2 26 23  --  26  --  26  --   --   --   --   --  25  --   GLUCOSE 105* 104*  --  98  --  88  --   --   --   --   --  82  --   BUN 15 8  --  6*  --  5*  --   --   --   --   --  7*  --   CREATININE 0.69 0.39*  --  0.39*  --  0.34*  --   --   --   --   --  0.51  --   CALCIUM 9.2 8.8*  --  8.4*  --  8.3*  --   --   --   --   --  8.5*  --   MG  --   --   --   1.9  --  1.7  --   --   --   --  2.5*  --   --   PHOS  --   --   --   --   --   --   --   --   --   --   --   --  2.2*   < > = values in this interval not displayed.    Liver Function Tests: Recent Labs  Lab 04/25/19 1149 04/27/19 0340  AST 22 24  ALT 15 18  ALKPHOS 66 78  BILITOT 0.5 0.6  PROT 7.2 7.3  ALBUMIN 3.8 4.0   No results for input(s): LIPASE, AMYLASE in the last 168 hours. No results for input(s): AMMONIA in the last 168 hours.  CBC: Recent Labs  Lab 04/25/19 1149 04/27/19 0340 04/28/19 0337 04/29/19 0423 04/30/19 0531  WBC 10.9* 8.2 9.1 8.5 9.9  HGB 12.6 12.4 12.0 11.1* 11.8*  HCT 39.3 36.3 34.9* 32.9* 35.7*  MCV 86.6 81.8 81.4 82.7 84.4  PLT 305 287 293 284 334    Cardiac Enzymes: No results for input(s): CKTOTAL, CKMB, CKMBINDEX, TROPONINI in the last 168 hours.  BNP: Invalid input(s): POCBNP  CBG: Recent Labs  Lab 04/29/19 0821  GLUCAP 76    Microbiology: Results for orders placed or performed during the hospital encounter of 04/27/19  SARS Coronavirus 2 (CEPHEID - Performed in [place] hospital lab), Hosp Order     Status: None   Collection Time: 04/27/19  3:19 AM   Specimen: Nasopharyngeal Swab  Result Value Ref Range Status   SARS Coronavirus 2 NEGATIVE NEGATIVE Final    Comment: (NOTE) If result is NEGATIVE SARS-CoV-2 target nucleic acids are NOT DETECTED. The SARS-CoV-2 RNA is generally detectable in upper and lower  respiratory specimens during the acute phase of infection. The lowest  concentration of SARS-CoV-2 viral copies this assay can detect is 250  copies / mL. A negative result does not preclude SARS-CoV-2 infection  and should not be used as the sole basis for treatment or other  patient management decisions.  A negative result may occur with  improper specimen collection / handling, submission of specimen other  than nasopharyngeal swab, presence of viral mutation(s) within the  areas targeted by this assay, and  inadequate number of viral copies  (<250 copies / mL). A negative result must be combined with clinical  observations, patient history, and epidemiological information. If result is POSITIVE SARS-CoV-2 target nucleic acids are DETECTED. The SARS-CoV-2 RNA is generally detectable in upper and lower  respiratory specimens dur ing the acute phase of infection.  Positive  results are indicative of active infection with SARS-CoV-2.  Clinical  correlation with patient history and other diagnostic information is  necessary to determine patient infection status.  Positive results do  not rule out bacterial infection or co-infection with other viruses. If result is PRESUMPTIVE POSTIVE SARS-CoV-2 nucleic acids MAY BE PRESENT.   A presumptive positive result was obtained on the submitted specimen  and confirmed on repeat testing.  While 2019 novel coronavirus  (SARS-CoV-2) nucleic acids may be present in the submitted sample  additional confirmatory testing may be necessary for epidemiological  and / or clinical management purposes  to differentiate between  SARS-CoV-2 and other Sarbecovirus currently known to infect humans.  If clinically indicated additional testing with an alternate test  methodology [phone]) is advised. The SARS-CoV-2 RNA is generally  detectable in upper and lower respiratory sp ecimens during the acute  phase of infection. The expected result is Negative. Fact Sheet for Patients:  StrictlyIdeas.no Fact Sheet for Healthcare Providers: BankingDealers.co.za This test is not yet approved or cleared by the [place] FDA and has been authorized for detection and/or diagnosis of SARS-CoV-2 by FDA under an Emergency Use Authorization (EUA).  This EUA will remain in effect (meaning this test can be used) for the duration of the COVID-19 declaration under Section 564(b)(1) of the Act, 21 U.S.C. section 360bbb-3(b)(1), unless the  authorization is terminated or revoked sooner. Performed at [facility], [address] [place],  [place] [address]   MRSA PCR Screening     Status: None   Collection Time: 04/29/19 11:13 AM   Specimen: Nasal Mucosa; Nasopharyngeal  Result Value Ref Range Status   MRSA by PCR NEGATIVE NEGATIVE Final    Comment:        The GeneXpert MRSA Assay (FDA approved for NASAL specimens only), is one component of a comprehensive MRSA colonization surveillance program. It is not intended to diagnose MRSA infection nor to guide or monitor treatment for MRSA infections. Performed at [facility], [address] [place], [place] [address]     Coagulation Studies: No results for input(s): LABPROT, INR in the last 72 hours.  Urinalysis: No results for input(s): COLORURINE, LABSPEC, PHURINE, GLUCOSEU, HGBUR, BILIRUBINUR, KETONESUR, PROTEINUR, UROBILINOGEN, NITRITE, LEUKOCYTESUR in the last 72 hours.  Invalid input(s): APPERANCEUR    Imaging: Ct Chest W Contrast  Result Date: 04/29/2019 CLINICAL DATA:  Hyponatremia.  Possible SIADH. EXAM: CT CHEST WITH CONTRAST TECHNIQUE: Multidetector CT imaging of the chest was performed during intravenous contrast administration. CONTRAST:  16mL OMNIPAQUE IOHEXOL 300 MG/ML  SOLN COMPARISON:  Chest x-ray dated 04/27/2019 and CT scan of the chest dated 09/27/2018 FINDINGS: Cardiovascular: No significant vascular findings. Normal heart size. No pericardial effusion. Aortic atherosclerosis. Mediastinum/Nodes: No adenopathy. Large chronic hiatal hernia. Trachea is normal. Thyroid gland is obscured by metallic artifact from anterior cervical fusion devices. Lungs/Pleura: There is a 7 mm area of irregular parenchymal density at the left apex which is new since the prior study. There is also a focal area of parenchymal scarring in the left upper lobe which appears less prominent than on the prior exam. Upper Abdomen: No acute abnormality.   Cholecystectomy. Musculoskeletal: No acute abnormalities. Old compression fracture of T11. The local lumbar scoliosis. IMPRESSION: 1. Small focal inflammatory changes in the left lung apex. 2. No evidence of malignancy in the lungs. 3.  Aortic Atherosclerosis (ICD10-I70.0). 4. Large chronic hiatal hernia. Electronically Signed   By: Lorriane Shire M.D.   On: 04/29/2019 09:38   [name] Ekg Site Rite  Result Date: 04/28/2019 If Site Rite image not attached, placement could not be confirmed due to current cardiac rhythm.    Medications:   . sodium chloride (hypertonic)     . amLODipine  5 mg Oral Daily  . cholecalciferol  2,000 Units Oral Daily  . citalopram  20 mg Oral Daily  . docusate sodium  100 mg Oral BID  . enoxaparin (LOVENOX) injection  40 mg Subcutaneous Q12H  . gabapentin  200 mg Oral BID  . levothyroxine  112 mcg Oral QAC breakfast  . metoprolol tartrate  100 mg Oral BID  . multivitamin with minerals  1 tablet Oral Daily  . pantoprazole (PROTONIX) IV  40 mg Intravenous Daily  . predniSONE  40 mg Oral Q breakfast  . sodium chloride flush  10-40 mL Intracatheter Q12H  . sodium chloride  1 g Oral TID WC  . tiotropium  18 mcg Inhalation Daily   acetaminophen **OR** acetaminophen, albuterol, hydrALAZINE, sodium chloride flush  Assessment/ Plan:  74 y.o. female  with a PMHx of COPD, depression, scoliosis, restless leg syndrome, GERD, degenertive disc disease, hypertension, osteoporosis, who was admitted to [facility] on 04/27/2019 for evaluation of altered mental status.   1.  Hyponatremia. 2.  Altered mental status. 3.  COPD.    Plan: Hypertonic saline restarted at 35 cc/h.  Target serum sodium within the next 24 hours is 133.  Agree with sodium chloride 1  g p.o. 3 times daily.  Consider adding furosemide as well once patient is off of 3% saline.  Mental status appears to be slowly improving.   LOS: 3 Shunda Rabadi 7/1/20203:52 PM

## 2019-04-30 NOTE — Consult Note (Signed)
[facility] Face-to-Face Psychiatry Consult   Reason for Consult:  Hallucinations  Referring Physician:  Dr Stark Jock Patient Identification: [name] MRN:  [id number] Principal Diagnosis: Hyponatremia Diagnosis:  Active Problems:   Hyponatremia   Hallucinations, visual  Total Time spent with patient: 15 minutes  04/30/2019-Patient seen today and presents much more alert and oriented.  She has not had any hallucinations.  She was able to talk about some events in her life including her daughter's death from cancer that is causing her some distress.  Her current confusion seems to have cleared up. Her Celexa was decreased to 20 mg and gabapentin to 200 mg twice daily. Plan to decrease Celexa to 10 mg tomorrow and the gabapentin to 200 mg once daily  04/29/2019-patient was transferred to the ICU early this morning and is critically ill with confusion and lethargy.  Per critical care patient has hyponatremia possibly due to SIADH.  Will decrease dose of Celexa to 20mg  and taper down completely since it may have a role in causing her SIADH in turn leading to the confusion.  04/28/2019-patient is a 73 year old female who was initially admitted for hyponatremia and hallucinations.  She was sleeping  this clinician saw this morning.  She did wake up on calling her name and  stated that she was doing  better.  She stated that she was all mixed up at that facility that she came to the hospital.  Per her sitter she was much more alert and oriented this morning.  She seems to be improving. We will continue the Celexa 40 mg for her depression and anxiety.  04/27/2019- [name] is a 73 y.o. female patient seen and evaluated for hallucinations.  When asked about hallucinations, she responded with "I would if could tell you what they were.  My children told me yesterday that I was seeing things, green things."  Denies current hallucinations with, "Well duh, no."  Confounding variables of hyponatremia, recent head  injury, and prednisone use daily.  Her TSH level is in the normal limit.  Not responding on assessment.  Endorses depression of "not bad", taking Celexa.  Anxiety of "yes", mild.  Denies suicidal/homicidal ideations and substance abuse.  No medication recommendation at this time for her hallucinations as this issue appears to have resolved.  Continue Celexa 40 mg daily for depression and anxiety.  Taper off gabapentin as this could be the causative factor in her hyponatremia with a side effect of dizziness and unsteadiness which could be contributing to her frequent falls (current dose of 300 mg QID decreased to 200 mg QID, see treatment plan below).  HPI:  On admission:  [name] is a 73 y.o. female brought to the ED from home by her family with a chief complaint of persistent headache and now visual hallucinations.  Patient had a fall with minor head injury approximately 12 days ago.  He was seen initially with negative CT head.  She has returned on 6/22 and 6/27 for persistent headaches.  Tonight her family brought her in under IVC because she is now hallucinating that her legs are green.  Patient denies any knowledge of this but states "I guess I am because that is what they are telling me".  Denies active SI/HI/AH.  States headache is generally better.  Family also reported that patient has been having more frequent falls and generalized weakness.  Patient found to have hyponatremia of 117, admitted to the medical floor.  Past Psychiatric History: depression, anxiety  Risk to Self:  none Risk to Others:  none Prior Inpatient Therapy:  none Prior Outpatient Therapy:  PCP  Past Medical History:  Past Medical History:  Diagnosis Date  . Allergy   . Asthma   . Back pain   . Bronchitis   . COPD (chronic obstructive pulmonary disease) ([place])   . Degenerative disc disease, lumbar   . Depression   . FHx: cholecystectomy   . GERD (gastroesophageal reflux disease)   . H/O: hysterectomy   .  Headache   . Hypertension   . Murmur   . Osteoporosis   . Pneumonia 03/31/16   being treated by PCP  . Restless legs syndrome   . Scoliosis   . Shingles   . Thyroid disease     Past Surgical History:  Procedure Laterality Date  . ABDOMINAL HYSTERECTOMY    . APPENDECTOMY    . BACK SURGERY    . CHOLECYSTECTOMY    . COLONOSCOPY WITH PROPOFOL N/A 01/16/2018   Procedure: COLONOSCOPY WITH PROPOFOL;  Surgeon: Toledo, Benay Pike, MD;  Location: [facility] ENDOSCOPY;  Service: Gastroenterology;  Laterality: N/A;  . ESOPHAGOGASTRODUODENOSCOPY (EGD) WITH PROPOFOL N/A 01/16/2018   Procedure: ESOPHAGOGASTRODUODENOSCOPY (EGD) WITH PROPOFOL;  Surgeon: Toledo, Benay Pike, MD;  Location: [facility] ENDOSCOPY;  Service: Gastroenterology;  Laterality: N/A;  . JOINT REPLACEMENT Left 1998   shoulder  . NECK SURGERY Bilateral   . ROTATOR CUFF REPAIR Right   . SHOULDER OPEN ROTATOR CUFF REPAIR Right   . SHOULDER SURGERY Left    replacement   Family History:  Family History  Problem Relation Age of Onset  . Kidney disease Father   . Heart disease Mother   . Diabetes Mother    Family Psychiatric  History: none Social History:  Social History   Substance and Sexual Activity  Alcohol Use No  . Alcohol/week: 0.0 standard drinks     Social History   Substance and Sexual Activity  Drug Use No    Social History   Socioeconomic History  . Marital status: Married    Spouse name: Not on file  . Number of children: Not on file  . Years of education: Not on file  . Highest education level: Not on file  Occupational History  . Not on file  Social Needs  . Financial resource strain: Not on file  . Food insecurity    Worry: Not on file    Inability: Not on file  . Transportation needs    Medical: Not on file    Non-medical: Not on file  Tobacco Use  . Smoking status: Former Smoker    Packs/day: 1.00    Years: 30.00    Pack years: 30.00    Types: Cigarettes    Quit date: 11/04/2016    Years since  quitting: 2.4  . Smokeless tobacco: Never Used  Substance and Sexual Activity  . Alcohol use: No    Alcohol/week: 0.0 standard drinks  . Drug use: No  . Sexual activity: Not on file  Lifestyle  . Physical activity    Days per week: Not on file    Minutes per session: Not on file  . Stress: Not on file  Relationships  . Social Herbalist on phone: Not on file    Gets together: Not on file    Attends religious service: Not on file    Active member of club or organization: Not on file    Attends meetings of clubs or organizations:  Not on file    Relationship status: Not on file  Other Topics Concern  . Not on file  Social History Narrative  . Not on file   Additional Social History:    Allergies:   Allergies  Allergen Reactions  . Adhesive [Tape] Other (See Comments)  . Other Rash    Plastic tape    Labs:  Results for orders placed or performed during the hospital encounter of 04/27/19 (from the past 48 hour(s))  Sodium     Status: Abnormal   Collection Time: 04/28/19  1:45 PM  Result Value Ref Range   Sodium 117 (LL) 135 - 145 mmol/L    Comment: CRITICAL RESULT CALLED TO, READ BACK BY AND VERIFIED WITH ALEKSEY FRASER/1412/CHC/04/28/2019 Performed at [facility], [address] [place], [place] [address]   Sodium     Status: Abnormal   Collection Time: 04/28/19  3:21 PM  Result Value Ref Range   Sodium 116 (LL) 135 - 145 mmol/L    Comment: CRITICAL RESULT CALLED TO, READ BACK BY AND VERIFIED WITH ALESKEY FRASER 1539/CHC/04/28/2019 Performed at [facility], [address] [place], [place] [address]   Osmolality     Status: Abnormal   Collection Time: 04/28/19  3:21 PM  Result Value Ref Range   Osmolality 241 (LL) 275 - 295 mOsm/kg    Comment: CRITICAL RESULT CALLED TO, READ BACK BY AND VERIFIED WITH: DEJA MALCOM @1913  04/28/19 MJU Performed at [facility], [address] [place], [place] [address]   Sodium      Status: Abnormal   Collection Time: 04/28/19  5:19 PM  Result Value Ref Range   Sodium 118 (LL) 135 - 145 mmol/L    Comment: CRITICAL RESULT CALLED TO, READ BACK BY AND VERIFIED WITH ALEKSEY FRRASER/CHC/1749/04/27/2020 Performed at [facility], [address] [place], [place] [address]   Sodium     Status: Abnormal   Collection Time: 04/28/19  9:43 PM  Result Value Ref Range   Sodium 117 (LL) 135 - 145 mmol/L    Comment: CRITICAL RESULT CALLED TO, READ BACK BY AND VERIFIED WITH BRANDY MILLER AT 2210 ON 04/28/2019 JJB Performed at [facility], [address] [place], [place] [address]   Sodium     Status: Abnormal   Collection Time: 04/29/19 12:10 AM  Result Value Ref Range   Sodium 118 (LL) 135 - 145 mmol/L    Comment: CRITICAL RESULT CALLED TO, READ BACK BY AND VERIFIED WITH BETTY MILLER AT 0036 ON 04/29/2019 JJB Performed at [facility], [address] [place], [place] [address]   Basic metabolic panel     Status: Abnormal   Collection Time: 04/29/19  4:23 AM  Result Value Ref Range   Sodium 120 (L) 135 - 145 mmol/L   Potassium 3.2 (L) 3.5 - 5.1 mmol/L   Chloride 87 (L) 98 - 111 mmol/L   CO2 26 22 - 32 mmol/L   Glucose, Bld 88 70 - 99 mg/dL   BUN 5 (L) 8 - 23 mg/dL   Creatinine, Ser 0.34 (L) 0.44 - 1.00 mg/dL   Calcium 8.3 (L) 8.9 - 10.3 mg/dL   GFR calc non Af Amer >60 >60 mL/min   GFR calc Af Amer >60 >60 mL/min   Anion gap 7 5 - 15    Comment: Performed at [facility], [address] [place],  [address]  CBC     Status: Abnormal   Collection Time:  04/29/19  4:23 AM  Result Value Ref Range   WBC 8.5 4.0 - 10.5 K/uL   RBC 3.98 3.87 - 5.11 MIL/uL   Hemoglobin 11.1 (L) 12.0 - 15.0 g/dL   HCT 32.9 (L) 36.0 - 46.0 %   MCV 82.7 80.0 - 100.0 fL   MCH 27.9 26.0 - 34.0 pg   MCHC 33.7 30.0 - 36.0 g/dL   RDW 13.6 11.5 - 15.5 %   Platelets 284 150 - 400 K/uL   nRBC 0.0 0.0 - 0.2 %    Comment: Performed at [facility], [address], [place], [place] [address]  Magnesium     Status: None   Collection Time: 04/29/19  4:23 AM  Result Value Ref Range   Magnesium 1.7 1.7 - 2.4 mg/dL    Comment: Performed at [facility], [address] [place], [place] [address]  Glucose, capillary     Status: None   Collection Time: 04/29/19  8:21 AM  Result Value Ref Range   Glucose-Capillary 76 70 - 99 mg/dL  Sodium     Status: Abnormal   Collection Time: 04/29/19  8:59 AM  Result Value Ref Range   Sodium 122 (L) 135 - 145 mmol/L    Comment: Performed at [facility], [address], [place], [place] [address]  MRSA PCR Screening     Status: None   Collection Time: 04/29/19 11:13 AM   Specimen: Nasal Mucosa; Nasopharyngeal  Result Value Ref Range   MRSA by PCR NEGATIVE NEGATIVE    Comment:        The GeneXpert MRSA Assay (FDA approved for NASAL specimens only), is one component of a comprehensive MRSA colonization surveillance program. It is not intended to diagnose MRSA infection nor to guide or monitor treatment for MRSA infections. Performed at [facility], [address] [place], [place] [address]   Sodium     Status: Abnormal   Collection Time: 04/29/19  1:10 PM  Result Value Ref Range   Sodium 123 (L) 135 - 145 mmol/L    Comment: Performed at [facility], [address] [place], [place] [address]  Sodium     Status: Abnormal   Collection Time: 04/29/19  4:47 PM  Result Value Ref Range   Sodium 126 (L) 135 - 145 mmol/L    Comment: Performed at [facility], [address] [place], [place] [address]  Sodium     Status: Abnormal   Collection Time: 04/29/19  8:00 PM  Result Value Ref Range   Sodium 127 (L) 135 - 145 mmol/L    Comment: Performed at [facility], [address] [place], [place] [address]  Sodium     Status: Abnormal   Collection Time: 04/29/19 11:20 PM  Result Value Ref Range   Sodium 127 (L) 135 -  145 mmol/L    Comment: Performed at [facility], [address] [place], [place] [address]  Magnesium     Status: Abnormal   Collection Time: 04/30/19 12:00 AM  Result Value Ref Range   Magnesium 2.5 (H) 1.7 - 2.4 mg/dL    Comment: Performed at [facility], [address] [place], [place] [address]  CBC     Status: Abnormal   Collection Time: 04/30/19  5:31 AM  Result Value Ref Range   WBC 9.9 4.0 - 10.5 K/uL   RBC 4.23 3.87 - 5.11 MIL/uL   Hemoglobin 11.8 (L) 12.0 - 15.0 g/dL  HCT 35.7 (L) 36.0 - 46.0 %   MCV 84.4 80.0 - 100.0 fL   MCH 27.9 26.0 - 34.0 pg   MCHC 33.1 30.0 - 36.0 g/dL   RDW 14.4 11.5 - 15.5 %   Platelets 334 150 - 400 K/uL   nRBC 0.0 0.0 - 0.2 %    Comment: Performed at [facility], [address] [place], [place] [address]  Basic metabolic panel     Status: Abnormal   Collection Time: 04/30/19  5:31 AM  Result Value Ref Range   Sodium 127 (L) 135 - 145 mmol/L   Potassium 3.4 (L) 3.5 - 5.1 mmol/L   Chloride 91 (L) 98 - 111 mmol/L   CO2 25 22 - 32 mmol/L   Glucose, Bld 82 70 - 99 mg/dL   BUN 7 (L) 8 - 23 mg/dL   Creatinine, Ser 0.51 0.44 - 1.00 mg/dL   Calcium 8.5 (L) 8.9 - 10.3 mg/dL   GFR calc non Af Amer >60 >60 mL/min   GFR calc Af Amer >60 >60 mL/min   Anion gap 11 5 - 15    Comment: Performed at [facility], [address], [place], [place] [address]    Current Facility-Administered Medications  Medication Dose Route Frequency Provider Last Rate Last Dose  . acetaminophen (TYLENOL) tablet 650 mg  650 mg Oral Q6H PRN Harrie Foreman, MD   650 mg at 04/30/19 8099   Or  . acetaminophen (TYLENOL) suppository 650 mg  650 mg Rectal Q6H PRN Harrie Foreman, MD      . albuterol (PROVENTIL) (2.5 MG/3ML) 0.083% nebulizer solution 2.5 mg  2.5 mg Nebulization Q4H PRN Harrie Foreman, MD   2.5 mg at 04/28/19 0931  . amLODipine (NORVASC) tablet 5 mg  5 mg Oral Daily Harrie Foreman, MD   5 mg at 04/30/19  1055  . cholecalciferol (VITAMIN D) tablet 2,000 Units  2,000 Units Oral Daily Harrie Foreman, MD   2,000 Units at 04/30/19 1056  . citalopram (CELEXA) tablet 20 mg  20 mg Oral Daily Lulla Linville, MD   20 mg at 04/30/19 1057  . docusate sodium (COLACE) capsule 100 mg  100 mg Oral BID Harrie Foreman, MD   100 mg at 04/30/19 1055  . enoxaparin (LOVENOX) injection 40 mg  40 mg Subcutaneous Q12H Harrie Foreman, MD   40 mg at 04/30/19 1056  . gabapentin (NEURONTIN) capsule 200 mg  200 mg Oral BID Kayliana Codd, MD   200 mg at 04/30/19 1056  . hydrALAZINE (APRESOLINE) injection 10 mg  10 mg Intravenous Q6H PRN Ojie, Jude, MD      . levothyroxine (SYNTHROID) tablet 112 mcg  112 mcg Oral QAC breakfast Harrie Foreman, MD   112 mcg at 04/30/19 0615  . metoprolol tartrate (LOPRESSOR) tablet 100 mg  100 mg Oral BID Harrie Foreman, MD   100 mg at 04/30/19 1055  . multivitamin with minerals tablet 1 tablet  1 tablet Oral Daily Harrie Foreman, MD   1 tablet at 04/30/19 1056  . pantoprazole (PROTONIX) injection 40 mg  40 mg Intravenous Daily Ottie Glazier, MD   40 mg at 04/30/19 1055  . predniSONE (DELTASONE) tablet 40 mg  40 mg Oral Q breakfast Harrie Foreman, MD   40 mg at 04/30/19 0803  . sodium chloride (hypertonic) 3 % solution   Intravenous Continuous Lateef, Munsoor, MD      . sodium chloride  flush (NS) 0.9 % injection 10-40 mL  10-40 mL Intracatheter Q12H Ojie, Jude, MD   10 mL at 04/30/19 1055  . sodium chloride flush (NS) 0.9 % injection 10-40 mL  10-40 mL Intracatheter PRN Ojie, Jude, MD      . sodium chloride tablet 1 g  1 g Oral TID WC Ottie Glazier, MD   1 g at 04/30/19 1056  . tiotropium (SPIRIVA) inhalation capsule ([facility] use ONLY) 18 mcg  18 mcg Inhalation Daily Harrie Foreman, MD   18 mcg at 04/28/19 0913    Musculoskeletal: Strength & Muscle Tone: decreased Gait & Station: did not witness Patient leans: N/A  Psychiatric Specialty Exam: Physical Exam   Nursing note and vitals reviewed. Constitutional: She is oriented to person, place, and time. She appears well-developed and well-nourished.  HENT:  Head: Normocephalic.  Neck: Normal range of motion.  Respiratory: Effort normal.  Neurological: She is alert and oriented to person, place, and time.  Psychiatric: Her speech is normal and behavior is normal. Judgment and thought content normal. Her mood appears anxious. Her affect is blunt. Cognition and memory are normal. She exhibits a depressed mood.    Review of Systems  Constitutional: Positive for malaise/fatigue.  Musculoskeletal: Positive for falls and neck pain.  Neurological: Positive for headaches.  Psychiatric/Behavioral: Positive for depression. The patient is nervous/anxious.   All other systems reviewed and are negative.   Blood pressure (!) 143/91, pulse 84, temperature 97.9 F (36.6 C), temperature source Oral, resp. rate 14, height 4\' 10"  (1.473 m), weight 93.9 kg, SpO2 94 %.Body mass index is 43.27 kg/m.  General Appearance: Casual  Eye Contact:  poor  Speech:  slow  Volume:  low  Mood: dysphoric  Affect:  Blunt  Thought Process:  Coherent and Descriptions of Associations: Intact  Orientation:  Full (Time, Place, and Person)  Thought Content:  WDL and Logical  Suicidal Thoughts:  No  Homicidal Thoughts:  No    Judgement:  improving  Insight:  improving  Psychomotor Activity:  Decreased  Concentration:  Concentration: Fair and Attention Span: Fair  Recall:  [employer] of Knowledge: fair  Language: fair  Akathisia:  No  Handed:  Right  AIMS (if indicated):     Assets:  Housing Leisure Time Resilience Social Support  ADL's:  Impaired  Cognition:  WNL  Sleep:   Fair     Treatment Plan Summary: Daily contact with patient to assess and evaluate symptoms and progress in treatment, Medication management and Plan hallucinations, visual:  -Improved with no hallucinations today -Continue Celexa at 20mg  ,  taper down completely in the next few days. Will continue to monitor.   Falls and Hyponatremia -. Continue Gabapentin at 200 mg bid with a continue tapered off plan as this can cause hyponatremia and falls  Disposition: No evidence of imminent risk to self or others at present.   Patient does not meet criteria for psychiatric inpatient admission. Supportive therapy provided about ongoing stressors.  Elvin So, MD 04/30/2019 12:59 PM

## 2019-04-30 NOTE — Progress Notes (Signed)
CRITICAL CARE NOTE      CHIEF COMPLAINT:   Lethargy, s/p head trauma post mechanical fall and with hyponatremia   SUBJECTIVE FINDINGS & SIGNIFICANT EVENTS   Mentation improved still with mild confusion , unclear baseline.  Holding 3%NS due to rapid increase of Na, will start salt tablets and monitor Na q4h.  Plan to optimize for medsurg transfer.   Hyponatremia  - hypotonic euvlemic hyponatremia due to SIADH secondary to SSRI with NSAIDs    PAST MEDICAL HISTORY   Past Medical History:  Diagnosis Date   Allergy    Asthma    Back pain    Bronchitis    COPD (chronic obstructive pulmonary disease) (HCC)    Degenerative disc disease, lumbar    Depression    FHx: cholecystectomy    GERD (gastroesophageal reflux disease)    H/O: hysterectomy    Headache    Hypertension    Murmur    Osteoporosis    Pneumonia 03/31/16   being treated by PCP   Restless legs syndrome    Scoliosis    Shingles    Thyroid disease      SURGICAL HISTORY   Past Surgical History:  Procedure Laterality Date   ABDOMINAL HYSTERECTOMY     APPENDECTOMY     BACK SURGERY     CHOLECYSTECTOMY     COLONOSCOPY WITH PROPOFOL N/A 01/16/2018   Procedure: COLONOSCOPY WITH PROPOFOL;  Surgeon: Toledo, Benay Pike, MD;  Location: [facility];  Service: Gastroenterology;  Laterality: N/A;   ESOPHAGOGASTRODUODENOSCOPY (EGD) WITH PROPOFOL N/A 01/16/2018   Procedure: ESOPHAGOGASTRODUODENOSCOPY (EGD) WITH PROPOFOL;  Surgeon: Toledo, Benay Pike, MD;  Location: [facility];  Service: Gastroenterology;  Laterality: N/A;   JOINT REPLACEMENT Left 1998   shoulder   NECK SURGERY Bilateral    ROTATOR CUFF REPAIR Right    SHOULDER OPEN ROTATOR CUFF REPAIR Right    SHOULDER SURGERY Left    replacement     FAMILY  HISTORY   Family History  Problem Relation Age of Onset   Kidney disease Father    Heart disease Mother    Diabetes Mother      SOCIAL HISTORY   Social History   Tobacco Use   Smoking status: Former Smoker    Packs/day: 1.00    Years: 30.00    Pack years: 30.00    Types: Cigarettes    Quit date: 11/04/2016    Years since quitting: 2.4   Smokeless tobacco: Never Used  Substance Use Topics   Alcohol use: No    Alcohol/week: 0.0 standard drinks   Drug use: No     MEDICATIONS   Current Medication:  Current Facility-Administered Medications:    acetaminophen (TYLENOL) tablet 650 mg, 650 mg, Oral, Q6H PRN, 650 mg at 04/30/19 0626 **OR** acetaminophen (TYLENOL) suppository 650 mg, 650 mg, Rectal, Q6H PRN, Harrie Foreman, MD   albuterol (PROVENTIL) (2.5 MG/3ML) 0.083% nebulizer solution 2.5 mg, 2.5 mg, Nebulization, Q4H PRN, Harrie Foreman, MD, 2.5 mg at 04/28/19 0931   amLODipine (NORVASC) tablet 5 mg, 5 mg, Oral, Daily, Harrie Foreman, MD, 5 mg at 04/28/19 0623   cholecalciferol (VITAMIN D) tablet 2,000 Units, 2,000 Units, Oral, Daily, Harrie Foreman, MD, 2,000 Units at 04/28/19 0916   citalopram (CELEXA) tablet 20 mg, 20 mg, Oral, Daily, Ravi, Himabindu, MD   docusate sodium (COLACE) capsule 100 mg, 100 mg, Oral, BID, Harrie Foreman, MD, 100 mg at 04/29/19 2131   enoxaparin (LOVENOX) injection 40 mg,  40 mg, Subcutaneous, Q12H, Harrie Foreman, MD, 40 mg at 04/29/19 2131   gabapentin (NEURONTIN) capsule 200 mg, 200 mg, Oral, BID, Ravi, Himabindu, MD, 200 mg at 04/29/19 2131   hydrALAZINE (APRESOLINE) injection 10 mg, 10 mg, Intravenous, Q6H PRN, Stark Jock, Jude, MD   levothyroxine (SYNTHROID) tablet 112 mcg, 112 mcg, Oral, QAC breakfast, Harrie Foreman, MD, 112 mcg at 04/30/19 0615   metoprolol tartrate (LOPRESSOR) tablet 100 mg, 100 mg, Oral, BID, Harrie Foreman, MD, 100 mg at 04/29/19 2131   multivitamin with minerals tablet 1  tablet, 1 tablet, Oral, Daily, Harrie Foreman, MD, 1 tablet at 04/28/19 0916   pantoprazole (PROTONIX) injection 40 mg, 40 mg, Intravenous, Daily, Lanney Gins, Donna Silverman, MD, 40 mg at 04/29/19 1124   predniSONE (DELTASONE) tablet 40 mg, 40 mg, Oral, Q breakfast, Harrie Foreman, MD, 40 mg at 04/30/19 7673   sodium chloride (hypertonic) 3 % solution, , Intravenous, Continuous, Stopped at 04/29/19 1647 **AND** hypertonic 3% sodium chloride pharmacy monitoring, , , Until Discontinued, Lateef, Munsoor, MD   sodium chloride flush (NS) 0.9 % injection 10-40 mL, 10-40 mL, Intracatheter, Q12H, Ojie, Jude, MD, 10 mL at 04/29/19 2132   sodium chloride flush (NS) 0.9 % injection 10-40 mL, 10-40 mL, Intracatheter, PRN, Stark Jock, Jude, MD   tiotropium (SPIRIVA) inhalation capsule ([facility] use ONLY) 18 mcg, 18 mcg, Inhalation, Daily, Harrie Foreman, MD, 18 mcg at 04/28/19 0913    ALLERGIES   Adhesive [tape] and Other    REVIEW OF SYSTEMS     Unable to obtain due to lethargy  PHYSICAL EXAMINATION   Vitals:   04/30/19 0300 04/30/19 0800  BP:  126/82  Pulse: 73 77  Resp: 14 16  Temp:  97.9 F (36.6 C)  SpO2:  93%    GENERAL: Chronically ill-appearing confused and lethargic HEAD: Normocephalic, atraumatic.  EYES: Pupils equal, round, reactive to light.  No scleral icterus.  MOUTH: Moist mucosal membrane. NECK: Supple. No thyromegaly. No nodules. No JVD.  PULMONARY: CTAB CARDIOVASCULAR: S1 and S2. Regular rate and rhythm. No murmurs, rubs, or gallops.  GASTROINTESTINAL: Soft, nontender, non-distended. No masses. Positive bowel sounds. No hepatosplenomegaly.  MUSCULOSKELETAL: No swelling, clubbing, or edema.  NEUROLOGIC: Mild distress due to acute illness SKIN:intact,warm,dry   LABS AND IMAGING       LAB RESULTS: Recent Labs  Lab 04/28/19 0337  04/29/19 0423  04/29/19 2000 04/29/19 2320 04/30/19 0531  NA 119*   < > 120*   < > 127* 127* 127*  K 3.2*  --  3.2*  --   --   --   3.4*  CL 84*  --  87*  --   --   --  91*  CO2 26  --  26  --   --   --  25  BUN 6*  --  5*  --   --   --  7*  CREATININE 0.39*  --  0.34*  --   --   --  0.51  GLUCOSE 98  --  88  --   --   --  82   < > = values in this interval not displayed.   Recent Labs  Lab 04/28/19 0337 04/29/19 0423 04/30/19 0531  HGB 12.0 11.1* 11.8*  HCT 34.9* 32.9* 35.7*  WBC 9.1 8.5 9.9  PLT 293 284 334     IMAGING RESULTS: Ct Chest W Contrast  Result Date: 04/29/2019 CLINICAL DATA:  Hyponatremia.  Possible SIADH. EXAM: CT  CHEST WITH CONTRAST TECHNIQUE: Multidetector CT imaging of the chest was performed during intravenous contrast administration. CONTRAST:  4mL OMNIPAQUE IOHEXOL 300 MG/ML  SOLN COMPARISON:  Chest x-ray dated 04/27/2019 and CT scan of the chest dated 09/27/2018 FINDINGS: Cardiovascular: No significant vascular findings. Normal heart size. No pericardial effusion. Aortic atherosclerosis. Mediastinum/Nodes: No adenopathy. Large chronic hiatal hernia. Trachea is normal. Thyroid gland is obscured by metallic artifact from anterior cervical fusion devices. Lungs/Pleura: There is a 7 mm area of irregular parenchymal density at the left apex which is new since the prior study. There is also a focal area of parenchymal scarring in the left upper lobe which appears less prominent than on the prior exam. Upper Abdomen: No acute abnormality.  Cholecystectomy. Musculoskeletal: No acute abnormalities. Old compression fracture of T11. The local lumbar scoliosis. IMPRESSION: 1. Small focal inflammatory changes in the left lung apex. 2. No evidence of malignancy in the lungs. 3.  Aortic Atherosclerosis (ICD10-I70.0). 4. Large chronic hiatal hernia. Electronically Signed   By: Lorriane Shire M.D.   On: 04/29/2019 09:38      ASSESSMENT AND PLAN    -Multidisciplinary rounds held today  Altered mental status with confusion and lethargy -Due to severe hyponatremia - treatment as per hyponatremia  protocol   Severe hypotonic euvolemic hyponatremia  -TSH normal  - no renal failure  - no thiazide use  - cortisol normal   - no known intracranial pathology to suspect reset osmostat  - non potomania induced   - suspect SIADH evidenced by decreased BUN with multiple meds on home MAR known to cause hyponatremia  - CT chest no lung mass - small area of rounded atelectasis in L apex and incidentally found large hiatal hernia with mass effect displacing aorta to right as well as extrinsically compressing LLL basal segments with resultant mild atelectasis.     ELECTROLYTES -follow labs as needed -replace as needed -pharmacy consultation   DVT/GI PRX ordered -SCDs  TRANSFUSIONS AS NEEDED MONITOR FSBS ASSESS the need for LABS as needed   Critical care provider statement:    Critical care time (minutes):  32   Critical care time was exclusive of:  Separately billable procedures and treating other patients   Critical care was necessary to treat or prevent imminent or life-threatening deterioration of the following conditions:  severe hyponatremia, altered mental status with lethargy, obesity, hiatal hernia, multiple comorbid conditions.    Critical care was time spent personally by me on the following activities:  Development of treatment plan with patient or surrogate, discussions with consultants, evaluation of patient's response to treatment, examination of patient, obtaining history from patient or surrogate, ordering and performing treatments and interventions, ordering and review of laboratory studies and re-evaluation of patient's condition.  I assumed direction of critical care for this patient from another provider in my specialty: no    This document was prepared using Dragon voice recognition software and may include unintentional dictation errors.    Ottie Glazier, M.D.  Division of [place]

## 2019-04-30 NOTE — Progress Notes (Signed)
PHARMACY CONSULT NOTE - FOLLOW UP  Pharmacy Consult for Electrolyte Monitoring and Replacement   Recent Labs: Potassium (mmol/L)  Date Value  04/30/2019 3.4 (L)  11/05/2014 4.0   Magnesium (mg/dL)  Date Value  04/30/2019 2.5 (H)  02/09/2014 2.2   Calcium (mg/dL)  Date Value  04/30/2019 8.5 (L)   Calcium, Total (mg/dL)  Date Value  11/05/2014 8.9   Albumin (g/dL)  Date Value  04/27/2019 4.0  11/05/2014 3.6   Phosphorus (mg/dL)  Date Value  04/30/2019 2.2 (L)   Sodium (mmol/L)  Date Value  04/30/2019 127 (L)  11/05/2014 131 (L)     Assessment: 6/30:  Na  0423 =  120                 0859 =  122                 1310 =  123                 1647 =  126                  2000 = 127       2320 = 127 7/01: Na   0530 = 127       1301 = 127       1545 = 128       1946 = 127  Goal of Therapy:  Na increase no greater than 8 mEq in 24 hrs   Plan:  Na staying steady between 127-128. 3% saline was initially expected to restart this morning per Dr. Holley Raring and Nephrology, but being held per Dr. Lanney Gins. Will continue sodium chloride 1g PO TID. Will continue to monitor. Recommend to restart hypertonic saline if sodium continues to drop.  Pearla Dubonnet ,PharmD Clinical Pharmacist 04/30/2019 8:20 PM

## 2019-05-01 DIAGNOSIS — R441 Visual hallucinations: Secondary | ICD-10-CM

## 2019-05-01 LAB — BASIC METABOLIC PANEL
Anion gap: 6 (ref 5–15)
BUN: 9 mg/dL (ref 8–23)
CO2: 29 mmol/L (ref 22–32)
Calcium: 8.6 mg/dL — ABNORMAL LOW (ref 8.9–10.3)
Chloride: 92 mmol/L — ABNORMAL LOW (ref 98–111)
Creatinine, Ser: 0.5 mg/dL (ref 0.44–1.00)
GFR calc Af Amer: >60 mL/min (ref >60)
GFR calc non Af Amer: >60 mL/min (ref >60)
Glucose, Bld: 89 mg/dL (ref 70–99)
Potassium: 4 mmol/L (ref 3.5–5.1)
Sodium: 127 mmol/L — ABNORMAL LOW (ref 135–145)

## 2019-05-01 LAB — CBC
HCT: 35.6 % — ABNORMAL LOW (ref 36.0–46.0)
Hemoglobin: 11.7 g/dL — ABNORMAL LOW (ref 12.0–15.0)
MCH: 27.9 pg (ref 26.0–34.0)
MCHC: 32.9 g/dL (ref 30.0–36.0)
MCV: 84.8 fL (ref 80.0–100.0)
Platelets: 314 10*3/uL (ref 150–400)
RBC: 4.2 MIL/uL (ref 3.87–5.11)
RDW: 14.6 % (ref 11.5–15.5)
WBC: 13.3 10*3/uL — ABNORMAL HIGH (ref 4.0–10.5)
nRBC: 0 % (ref 0.0–0.2)

## 2019-05-01 LAB — SODIUM
Sodium: 125 mmol/L — ABNORMAL LOW (ref 135–145)
Sodium: 125 mmol/L — ABNORMAL LOW (ref 135–145)
Sodium: 127 mmol/L — ABNORMAL LOW (ref 135–145)
Sodium: 127 mmol/L — ABNORMAL LOW (ref 135–145)
Sodium: 128 mmol/L — ABNORMAL LOW (ref 135–145)
Sodium: 129 mmol/L — ABNORMAL LOW (ref 135–145)

## 2019-05-01 LAB — MAGNESIUM: Magnesium: 2.1 mg/dL (ref 1.7–2.4)

## 2019-05-01 MED ORDER — CITALOPRAM HYDROBROMIDE 20 MG PO TABS
10.0000 mg | ORAL_TABLET | Freq: Every day | ORAL | Status: DC
  Administered 2019-05-02 – 2019-05-05 (×4): 10 mg via ORAL
  Filled 2019-05-01 (×4): qty 1

## 2019-05-01 MED ORDER — PANTOPRAZOLE SODIUM 40 MG PO TBEC
40.0000 mg | DELAYED_RELEASE_TABLET | Freq: Every day | ORAL | Status: DC
  Administered 2019-05-02 – 2019-05-05 (×4): 40 mg via ORAL
  Filled 2019-05-01 (×4): qty 1

## 2019-05-01 MED ORDER — SODIUM CHLORIDE 3 % IV SOLN
INTRAVENOUS | Status: DC
  Filled 2019-05-01: qty 500

## 2019-05-01 MED ORDER — GABAPENTIN 100 MG PO CAPS
200.0000 mg | ORAL_CAPSULE | Freq: Every day | ORAL | Status: DC
  Administered 2019-05-02 – 2019-05-04 (×3): 200 mg via ORAL
  Filled 2019-05-01 (×3): qty 2

## 2019-05-01 MED ORDER — FUROSEMIDE 20 MG PO TABS
20.0000 mg | ORAL_TABLET | Freq: Every day | ORAL | Status: DC
  Administered 2019-05-01 – 2019-05-05 (×5): 20 mg via ORAL
  Filled 2019-05-01 (×5): qty 1

## 2019-05-01 MED ORDER — ONDANSETRON HCL 4 MG/2ML IJ SOLN
4.0000 mg | Freq: Four times a day (QID) | INTRAMUSCULAR | Status: DC | PRN
  Administered 2019-05-01 – 2019-05-04 (×4): 4 mg via INTRAVENOUS
  Filled 2019-05-01 (×4): qty 2

## 2019-05-01 NOTE — Progress Notes (Signed)
Pharmacy Electrolyte Monitoring Consult:  Pharmacy consulted to assist in monitoring and replacing electrolytes in this 73 y.o. female admitted on 04/27/2019 with Headache and Altered Mental Status   Labs:  Sodium (mmol/L)  Date Value  05/01/2019 125 (L)  11/05/2014 131 (L)   Potassium (mmol/L)  Date Value  05/01/2019 4.0  11/05/2014 4.0   Magnesium (mg/dL)  Date Value  05/01/2019 2.1  02/09/2014 2.2   Phosphorus (mg/dL)  Date Value  04/30/2019 2.2 (L)   Calcium (mg/dL)  Date Value  05/01/2019 8.6 (L)   Calcium, Total (mg/dL)  Date Value  11/05/2014 8.9   Albumin (g/dL)  Date Value  04/27/2019 4.0  11/05/2014 3.6    Assessment/Plan: Sodium tablets 1 tab TIDWM started 7/1. Hypo natremia being managed by nephrology.   No additional replacement of electrolytes warranted.   Will obtain electrolytes with am labs.   Pharmacy will continue to monitor and adjust per consult.   Brodey Bonn L 05/01/2019 1:34 PM

## 2019-05-01 NOTE — Progress Notes (Signed)
PHARMACIST - PHYSICIAN COMMUNICATION  CONCERNING: IV to Oral Route Change Policy  RECOMMENDATION: This patient is receiving pantoprazole by the intravenous route.  Based on criteria approved by the Pharmacy and Therapeutics Committee, the intravenous medication(s) is/are being converted to the equivalent oral dose form(s).   DESCRIPTION: These criteria include:  The patient is eating (either orally or via tube) and/or has been taking other orally administered medications for a least 24 hours  The patient has no evidence of active gastrointestinal bleeding or impaired GI absorption (gastrectomy, short bowel, patient on TNA or NPO).  If you have questions about this conversion, please contact the Pharmacy Department   []   [phone] )  [place], [facility] 05/01/2019 2:02 PM

## 2019-05-01 NOTE — Progress Notes (Signed)
CRITICAL CARE NOTE      CHIEF COMPLAINT:   Lethargy, s/p head trauma post mechanical fall and with hyponatremia   SUBJECTIVE FINDINGS & SIGNIFICANT EVENTS   Mentation improved still with mild confusion , unclear baseline.   Hyponatremia  - hypotonic euvlemic hyponatremia due to SIADH secondary to SSRI with NSAIDs  -Will restart 3NS with close monitoring.  -did not make improvement with salt tabs  PAST MEDICAL HISTORY   Past Medical History:  Diagnosis Date  . Allergy   . Asthma   . Back pain   . Bronchitis   . COPD (chronic obstructive pulmonary disease) ([place])   . Degenerative disc disease, lumbar   . Depression   . FHx: cholecystectomy   . GERD (gastroesophageal reflux disease)   . H/O: hysterectomy   . Headache   . Hypertension   . Murmur   . Osteoporosis   . Pneumonia 03/31/16   being treated by PCP  . Restless legs syndrome   . Scoliosis   . Shingles   . Thyroid disease      SURGICAL HISTORY   Past Surgical History:  Procedure Laterality Date  . ABDOMINAL HYSTERECTOMY    . APPENDECTOMY    . BACK SURGERY    . CHOLECYSTECTOMY    . COLONOSCOPY WITH PROPOFOL N/A 01/16/2018   Procedure: COLONOSCOPY WITH PROPOFOL;  Surgeon: Toledo, Benay Pike, MD;  Location: [facility];  Service: Gastroenterology;  Laterality: N/A;  . ESOPHAGOGASTRODUODENOSCOPY (EGD) WITH PROPOFOL N/A 01/16/2018   Procedure: ESOPHAGOGASTRODUODENOSCOPY (EGD) WITH PROPOFOL;  Surgeon: Toledo, Benay Pike, MD;  Location: [facility];  Service: Gastroenterology;  Laterality: N/A;  . JOINT REPLACEMENT Left 1998   shoulder  . NECK SURGERY Bilateral   . ROTATOR CUFF REPAIR Right   . SHOULDER OPEN ROTATOR CUFF REPAIR Right   . SHOULDER SURGERY Left    replacement     FAMILY HISTORY   Family History  Problem Relation  Age of Onset  . Kidney disease Father   . Heart disease Mother   . Diabetes Mother      SOCIAL HISTORY   Social History   Tobacco Use  . Smoking status: Former Smoker    Packs/day: 1.00    Years: 30.00    Pack years: 30.00    Types: Cigarettes    Quit date: 11/04/2016    Years since quitting: 2.4  . Smokeless tobacco: Never Used  Substance Use Topics  . Alcohol use: No    Alcohol/week: 0.0 standard drinks  . Drug use: No     MEDICATIONS   Current Medication:  Current Facility-Administered Medications:  .  acetaminophen (TYLENOL) tablet 650 mg, 650 mg, Oral, Q6H PRN, 650 mg at 05/01/19 1045 **OR** acetaminophen (TYLENOL) suppository 650 mg, 650 mg, Rectal, Q6H PRN, Harrie Foreman, MD .  albuterol (PROVENTIL) (2.5 MG/3ML) 0.083% nebulizer solution 2.5 mg, 2.5 mg, Nebulization, Q4H PRN, Harrie Foreman, MD, 2.5 mg at 04/28/19 0931 .  amLODipine (NORVASC) tablet 5 mg, 5 mg, Oral, Daily, Harrie Foreman, MD, 5 mg at 05/01/19 1046 .  cholecalciferol (VITAMIN D) tablet 2,000 Units, 2,000 Units, Oral, Daily, Harrie Foreman, MD, 2,000 Units at 05/01/19 1044 .  citalopram (CELEXA) tablet 20 mg, 20 mg, Oral, Daily, Ravi, Himabindu, MD, 20 mg at 05/01/19 1045 .  docusate sodium (COLACE) capsule 100 mg, 100 mg, Oral, BID, Harrie Foreman, MD, 100 mg at 05/01/19 1045 .  enoxaparin (LOVENOX) injection 40 mg, 40 mg, Subcutaneous, Q12H, Rosilyn Mings  S, MD, 40 mg at 05/01/19 1047 .  gabapentin (NEURONTIN) capsule 200 mg, 200 mg, Oral, BID, Ravi, Himabindu, MD, 200 mg at 05/01/19 1043 .  hydrALAZINE (APRESOLINE) injection 10 mg, 10 mg, Intravenous, Q6H PRN, Ojie, Jude, MD .  levothyroxine (SYNTHROID) tablet 112 mcg, 112 mcg, Oral, QAC breakfast, Harrie Foreman, MD, 112 mcg at 05/01/19 0530 .  metoprolol tartrate (LOPRESSOR) tablet 100 mg, 100 mg, Oral, BID, Harrie Foreman, MD, 100 mg at 05/01/19 1045 .  multivitamin with minerals tablet 1 tablet, 1 tablet, Oral,  Daily, Harrie Foreman, MD, 1 tablet at 05/01/19 1045 .  ondansetron (ZOFRAN) injection 4 mg, 4 mg, Intravenous, Q6H PRN, Ottie Glazier, MD, 4 mg at 05/01/19 0903 .  pantoprazole (PROTONIX) injection 40 mg, 40 mg, Intravenous, Daily, Lanney Gins, Leiana Rund, MD, 40 mg at 05/01/19 1046 .  predniSONE (DELTASONE) tablet 40 mg, 40 mg, Oral, Q breakfast, Harrie Foreman, MD, 40 mg at 05/01/19 0751 .  sodium chloride (hypertonic) 3 % solution, , Intravenous, Continuous, Lateef, Munsoor, MD .  sodium chloride flush (NS) 0.9 % injection 10-40 mL, 10-40 mL, Intracatheter, Q12H, Ojie, Jude, MD, 10 mL at 05/01/19 1047 .  sodium chloride flush (NS) 0.9 % injection 10-40 mL, 10-40 mL, Intracatheter, PRN, Ojie, Jude, MD .  sodium chloride tablet 1 g, 1 g, Oral, TID WC, Lanney Gins, Azavion Bouillon, MD, 1 g at 05/01/19 0751 .  tiotropium (SPIRIVA) inhalation capsule ([facility] use ONLY) 18 mcg, 18 mcg, Inhalation, Daily, Harrie Foreman, MD, 18 mcg at 05/01/19 1047    ALLERGIES   Adhesive [tape] and Other    REVIEW OF SYSTEMS     Unable to obtain due to lethargy  PHYSICAL EXAMINATION   Vitals:   05/01/19 1000 05/01/19 1100  BP: 109/74 (!) 125/91  Pulse: (!) 105 94  Resp: 14 17  Temp:    SpO2:      GENERAL: Chronically ill-appearing confused and lethargic HEAD: Normocephalic, atraumatic.  EYES: Pupils equal, round, reactive to light.  No scleral icterus.  MOUTH: Moist mucosal membrane. NECK: Supple. No thyromegaly. No nodules. No JVD.  PULMONARY: CTAB CARDIOVASCULAR: S1 and S2. Regular rate and rhythm. No murmurs, rubs, or gallops.  GASTROINTESTINAL: Soft, nontender, non-distended. No masses. Positive bowel sounds. No hepatosplenomegaly.  MUSCULOSKELETAL: No swelling, clubbing, or edema.  NEUROLOGIC: Mild distress due to acute illness SKIN:intact,warm,dry   LABS AND IMAGING       LAB RESULTS: Recent Labs  Lab 04/29/19 0423  04/30/19 0531  04/30/19 2344 05/01/19 0342 05/01/19 0747  NA  120*   < > 127*   < > 127* 127*  127* 129*  K 3.2*  --  3.4*  --   --  4.0  --   CL 87*  --  91*  --   --  92*  --   CO2 26  --  25  --   --  29  --   BUN 5*  --  7*  --   --  9  --   CREATININE 0.34*  --  0.51  --   --  0.50  --   GLUCOSE 88  --  82  --   --  89  --    < > = values in this interval not displayed.   Recent Labs  Lab 04/29/19 0423 04/30/19 0531 05/01/19 0342  HGB 11.1* 11.8* 11.7*  HCT 32.9* 35.7* 35.6*  WBC 8.5 9.9 13.3*  PLT 284 334 314  IMAGING RESULTS: No results found.    ASSESSMENT AND PLAN    -Multidisciplinary rounds held today  Altered mental status with confusion and lethargy -Due to severe hyponatremia - treatment as per hyponatremia protocol   Severe hypotonic euvolemic hyponatremia  -TSH normal  - no renal failure  - no thiazide use  - cortisol normal   - no known intracranial pathology to suspect reset osmostat  - non potomania induced   - suspect SIADH evidenced by decreased BUN with multiple meds on home MAR known to cause hyponatremia  - CT chest no lung mass - small area of rounded atelectasis in L apex and incidentally found large hiatal hernia with mass effect displacing aorta to right as well as extrinsically compressing LLL basal segments with resultant mild atelectasis.     ELECTROLYTES -follow labs as needed -replace as needed -pharmacy consultation   DVT/GI PRX ordered -SCDs  TRANSFUSIONS AS NEEDED MONITOR FSBS ASSESS the need for LABS as needed   Critical care provider statement:    Critical care time (minutes):  31   Critical care time was exclusive of:  Separately billable procedures and treating other patients   Critical care was necessary to treat or prevent imminent or life-threatening deterioration of the following conditions:  severe hyponatremia, altered mental status with lethargy, obesity, hiatal hernia, multiple comorbid conditions.    Critical care was time spent personally by me on the following  activities:  Development of treatment plan with patient or surrogate, discussions with consultants, evaluation of patient's response to treatment, examination of patient, obtaining history from patient or surrogate, ordering and performing treatments and interventions, ordering and review of laboratory studies and re-evaluation of patient's condition.  I assumed direction of critical care for this patient from another provider in my specialty: no    This document was prepared using Dragon voice recognition software and may include unintentional dictation errors.    Ottie Glazier, M.D.  Division of [place]

## 2019-05-01 NOTE — Progress Notes (Addendum)
PHARMACY CONSULT NOTE - FOLLOW UP  Pharmacy Consult for Electrolyte Monitoring and Replacement   Recent Labs: Potassium (mmol/L)  Date Value  04/30/2019 3.4 (L)  11/05/2014 4.0   Magnesium (mg/dL)  Date Value  04/30/2019 2.5 (H)  02/09/2014 2.2   Calcium (mg/dL)  Date Value  04/30/2019 8.5 (L)   Calcium, Total (mg/dL)  Date Value  11/05/2014 8.9   Albumin (g/dL)  Date Value  04/27/2019 4.0  11/05/2014 3.6   Phosphorus (mg/dL)  Date Value  04/30/2019 2.2 (L)   Sodium (mmol/L)  Date Value  04/30/2019 127 (L)  11/05/2014 131 (L)     Assessment: 6/30:  Na  0423 =  120                 0859 =  122                 1310 =  123                 1647 =  126                  2000 = 127       2320 = 127 7/01: Na   0530 = 127       1301 = 127       1545 = 128       1946 = 127       2344 = 127  Goal of Therapy:  Na increase no greater than 8 mEq in 24 hrs   Plan:  Na staying steady between 127-128. 3% saline was initially expected to restart this morning per Dr. Holley Raring and Nephrology, but being held per Dr. Lanney Gins. Will continue sodium chloride 1g PO TID. Will continue to monitor. Recommend to restart normal saline initially if sodium continues to drop.  Tobie Lords ,PharmD Clinical Pharmacist 05/01/2019 1:34 AM

## 2019-05-01 NOTE — Progress Notes (Addendum)
PHARMACY CONSULT NOTE - FOLLOW UP  Pharmacy Consult for Electrolyte Monitoring and Replacement   Recent Labs: Potassium (mmol/L)  Date Value  05/01/2019 4.0  11/05/2014 4.0   Magnesium (mg/dL)  Date Value  05/01/2019 2.1  02/09/2014 2.2   Calcium (mg/dL)  Date Value  05/01/2019 8.6 (L)   Calcium, Total (mg/dL)  Date Value  11/05/2014 8.9   Albumin (g/dL)  Date Value  04/27/2019 4.0  11/05/2014 3.6   Phosphorus (mg/dL)  Date Value  04/30/2019 2.2 (L)   Sodium (mmol/L)  Date Value  05/01/2019 127 (L)  05/01/2019 127 (L)  11/05/2014 131 (L)     Assessment: 6/30:  Na  0423 =  120                 0859 =  122                 1310 =  123                 1647 =  126                  2000 = 127       2320 = 127 7/01: Na   0530 = 127       1301 = 127       1545 = 128       1946 = 127       2344 = 127 7/02: Na   0342 = 127 x 2  Goal of Therapy:  Na increase no greater than 8 mEq in 24 hrs   Plan:  Na staying steady between 127-128. 3% saline was initially expected to restart this morning per Dr. Holley Raring and Nephrology, but being held per Dr. Lanney Gins. Will continue sodium chloride 1g PO TID. Will continue to monitor. Recommend to restart normal saline initially if sodium continues to drop.  [name] ,PharmD Clinical Pharmacist 05/01/2019 4:42 AM

## 2019-05-01 NOTE — Progress Notes (Signed)
[facility]  ROUNDING NOTE   Subjective:  Sodium currently up to 129. Currently on 3% saline. Awake, alert, conversant.  Objective:  Vital signs in last 24 hours:  Temp:  [98.1 F (36.7 C)-98.4 F (36.9 C)] 98.3 F (36.8 C) (07/02 0746) Pulse Rate:  [70-105] 94 (07/02 1100) Resp:  [12-24] 17 (07/02 1100) BP: (104-155)/(72-137) 125/91 (07/02 1100) SpO2:  [92 %-98 %] 92 % (07/02 0500) Weight:  [93.3 kg] 93.3 kg (07/02 0346)  Weight change: -1.1 kg Filed Weights   04/29/19 0800 04/30/19 0500 05/01/19 0346  Weight: 94.4 kg 93.9 kg 93.3 kg    Intake/Output: I/O last 3 completed shifts: In: 28.3 [IV Piggyback:28.3] Out: 1050 [Urine:1050]   Intake/Output this shift:  Total I/O In: 120 [P.O.:120] Out: -   Physical Exam: General: No acute distress  Head: Normocephalic, atraumatic. Moist oral mucosal membranes  Eyes: Anicteric  Neck: Supple, trachea midline  Lungs:  Bilateral wheezing  Heart: S1S2 no rubs  Abdomen:  Soft, nontender, bowel sounds present  Extremities: Trace peripheral edema.  Neurologic:  Awake, alert, conversant.  Skin: No lesions       Basic Metabolic Panel: Recent Labs  Lab 04/27/19 0340  04/28/19 0337  04/29/19 0423  04/30/19 0000 04/30/19 0531 04/30/19 1301 04/30/19 1545 04/30/19 1946 04/30/19 2344 05/01/19 0342 05/01/19 0747  NA 117*   < > 119*   < > 120*   < >  --  127* 127* 128* 127* 127* 127*  127* 129*  K 3.8  --  3.2*  --  3.2*  --   --  3.4*  --   --   --   --  4.0  --   CL 83*  --  84*  --  87*  --   --  91*  --   --   --   --  92*  --   CO2 23  --  26  --  26  --   --  25  --   --   --   --  29  --   GLUCOSE 104*  --  98  --  88  --   --  82  --   --   --   --  89  --   BUN 8  --  6*  --  5*  --   --  7*  --   --   --   --  9  --   CREATININE 0.39*  --  0.39*  --  0.34*  --   --  0.51  --   --   --   --  0.50  --   CALCIUM 8.8*  --  8.4*  --  8.3*  --   --  8.5*  --   --   --   --  8.6*  --   MG  --   --  1.9   --  1.7  --  2.5*  --   --   --   --   --  2.1  --   PHOS  --   --   --   --   --   --   --   --  2.2*  --   --   --   --   --    < > = values in this interval not displayed.    Liver Function Tests: Recent Labs  Lab 04/25/19 1149 04/27/19 0340  AST 22 24  ALT 15 18  ALKPHOS 66 78  BILITOT 0.5 0.6  PROT 7.2 7.3  ALBUMIN 3.8 4.0   No results for input(s): LIPASE, AMYLASE in the last 168 hours. No results for input(s): AMMONIA in the last 168 hours.  CBC: Recent Labs  Lab 04/27/19 0340 04/28/19 0337 04/29/19 0423 04/30/19 0531 05/01/19 0342  WBC 8.2 9.1 8.5 9.9 13.3*  HGB 12.4 12.0 11.1* 11.8* 11.7*  HCT 36.3 34.9* 32.9* 35.7* 35.6*  MCV 81.8 81.4 82.7 84.4 84.8  PLT 287 293 284 334 314    Cardiac Enzymes: No results for input(s): CKTOTAL, CKMB, CKMBINDEX, TROPONINI in the last 168 hours.  BNP: Invalid input(s): POCBNP  CBG: Recent Labs  Lab 04/29/19 0821  GLUCAP 76    Microbiology: Results for orders placed or performed during the hospital encounter of 04/27/19  SARS Coronavirus 2 (CEPHEID - Performed in [place] hospital lab), Hosp Order     Status: None   Collection Time: 04/27/19  3:19 AM   Specimen: Nasopharyngeal Swab  Result Value Ref Range Status   SARS Coronavirus 2 NEGATIVE NEGATIVE Final    Comment: (NOTE) If result is NEGATIVE SARS-CoV-2 target nucleic acids are NOT DETECTED. The SARS-CoV-2 RNA is generally detectable in upper and lower  respiratory specimens during the acute phase of infection. The lowest  concentration of SARS-CoV-2 viral copies this assay can detect is 250  copies / mL. A negative result does not preclude SARS-CoV-2 infection  and should not be used as the sole basis for treatment or other  patient management decisions.  A negative result may occur with  improper specimen collection / handling, submission of specimen other  than nasopharyngeal swab, presence of viral mutation(s) within the  areas targeted by this  assay, and inadequate number of viral copies  (<250 copies / mL). A negative result must be combined with clinical  observations, patient history, and epidemiological information. If result is POSITIVE SARS-CoV-2 target nucleic acids are DETECTED. The SARS-CoV-2 RNA is generally detectable in upper and lower  respiratory specimens dur ing the acute phase of infection.  Positive  results are indicative of active infection with SARS-CoV-2.  Clinical  correlation with patient history and other diagnostic information is  necessary to determine patient infection status.  Positive results do  not rule out bacterial infection or co-infection with other viruses. If result is PRESUMPTIVE POSTIVE SARS-CoV-2 nucleic acids MAY BE PRESENT.   A presumptive positive result was obtained on the submitted specimen  and confirmed on repeat testing.  While 2019 novel coronavirus  (SARS-CoV-2) nucleic acids may be present in the submitted sample  additional confirmatory testing may be necessary for epidemiological  and / or clinical management purposes  to differentiate between  SARS-CoV-2 and other Sarbecovirus currently known to infect humans.  If clinically indicated additional testing with an alternate test  methodology [phone]) is advised. The SARS-CoV-2 RNA is generally  detectable in upper and lower respiratory sp ecimens during the acute  phase of infection. The expected result is Negative. Fact Sheet for Patients:  StrictlyIdeas.no Fact Sheet for Healthcare Providers: BankingDealers.co.za This test is not yet approved or cleared by the [place] FDA and has been authorized for detection and/or diagnosis of SARS-CoV-2 by FDA under an Emergency Use Authorization (EUA).  This EUA will remain in effect (meaning this test can be used) for the duration of the COVID-19 declaration under Section 564(b)(1) of the Act, 21 U.S.C. section 360bbb-3(b)(1),  unless the  authorization is terminated or revoked sooner. Performed at [facility], [address] [place], [place] [address]   MRSA PCR Screening     Status: None   Collection Time: 04/29/19 11:13 AM   Specimen: Nasal Mucosa; Nasopharyngeal  Result Value Ref Range Status   MRSA by PCR NEGATIVE NEGATIVE Final    Comment:        The GeneXpert MRSA Assay (FDA approved for NASAL specimens only), is one component of a comprehensive MRSA colonization surveillance program. It is not intended to diagnose MRSA infection nor to guide or monitor treatment for MRSA infections. Performed at [facility], [address] [place], [place] [address]     Coagulation Studies: No results for input(s): LABPROT, INR in the last 72 hours.  Urinalysis: No results for input(s): COLORURINE, LABSPEC, PHURINE, GLUCOSEU, HGBUR, BILIRUBINUR, KETONESUR, PROTEINUR, UROBILINOGEN, NITRITE, LEUKOCYTESUR in the last 72 hours.  Invalid input(s): APPERANCEUR    Imaging: No results found.   Medications:    . amLODipine  5 mg Oral Daily  . cholecalciferol  2,000 Units Oral Daily  . citalopram  20 mg Oral Daily  . docusate sodium  100 mg Oral BID  . enoxaparin (LOVENOX) injection  40 mg Subcutaneous Q12H  . gabapentin  200 mg Oral BID  . levothyroxine  112 mcg Oral QAC breakfast  . metoprolol tartrate  100 mg Oral BID  . multivitamin with minerals  1 tablet Oral Daily  . pantoprazole (PROTONIX) IV  40 mg Intravenous Daily  . predniSONE  40 mg Oral Q breakfast  . sodium chloride flush  10-40 mL Intracatheter Q12H  . sodium chloride  1 g Oral TID WC  . tiotropium  18 mcg Inhalation Daily   acetaminophen **OR** acetaminophen, albuterol, hydrALAZINE, ondansetron, sodium chloride flush  Assessment/ Plan:  73 y.o. female  with a PMHx of COPD, depression, scoliosis, restless leg syndrome, GERD, degenertive disc disease, hypertension, osteoporosis, who was admitted to [facility] on  04/27/2019 for evaluation of altered mental status.   1.  Hyponatremia. 2.  Altered mental status. 3.  COPD.    Plan: Patient now off of 3% saline.  We will start the patient on furosemide 20 mg daily and continue patient on sodium chloride 1 g p.o. 3 times daily.  Continue to monitor serum sodium closely.  Also recommend food restriction of 1.2 L or less.  Further plan as patient progresses.  LOS: 4 Jessicia Napolitano 7/2/20201:06 PM

## 2019-05-01 NOTE — Consult Note (Signed)
[facility] Face-to-Face Psychiatry Consult   Reason for Consult:  Hallucinations  Referring Physician:  Dr Stark Jock Patient Identification: [name] MRN:  [id number] Principal Diagnosis: Hyponatremia Diagnosis:  Active Problems:   Hyponatremia   Hallucinations, visual  Total Time spent with patient: 15 minutes  05/01/2019-patient seen today in her room.  She is alert and oriented and sitting up in a chair.  She states that she is feeling much better.  She has not had any hallucinations.  She is able to tell us the date and the year.  States that she had gotten somewhat mixed up and has been feeling much better.  She is waiting for a bed to be able to move out of the ICU. It was discussed with her about her history of depression and seeing a therapist and she is agreeable to giving that some thought.  04/30/2019-Patient seen today and presents much more alert and oriented.  She has not had any hallucinations.  She was able to talk about some events in her life including her daughter's death from cancer that is causing her some distress.  Her current confusion seems to have cleared up. Her Celexa was decreased to 20 mg and gabapentin to 200 mg twice daily. Plan to decrease Celexa to 10 mg tomorrow and the gabapentin to 200 mg once daily  04/29/2019-patient was transferred to the ICU early this morning and is critically ill with confusion and lethargy.  Per critical care patient has hyponatremia possibly due to SIADH.  Will decrease dose of Celexa to 20mg  and taper down completely since it may have a role in causing her SIADH in turn leading to the confusion.  04/28/2019-patient is a 73 year old female who was initially admitted for hyponatremia and hallucinations.  She was sleeping  this clinician saw this morning.  She did wake up on calling her name and  stated that she was doing  better.  She stated that she was all mixed up at that facility that she came to the hospital.  Per her sitter she was much more  alert and oriented this morning.  She seems to be improving. We will continue the Celexa 40 mg for her depression and anxiety.  04/27/2019- [name] is a 73 y.o. female patient seen and evaluated for hallucinations.  When asked about hallucinations, she responded with "I would if could tell you what they were.  My children told me yesterday that I was seeing things, green things."  Denies current hallucinations with, "Well duh, no."  Confounding variables of hyponatremia, recent head injury, and prednisone use daily.  Her TSH level is in the normal limit.  Not responding on assessment.  Endorses depression of "not bad", taking Celexa.  Anxiety of "yes", mild.  Denies suicidal/homicidal ideations and substance abuse.  No medication recommendation at this time for her hallucinations as this issue appears to have resolved.  Continue Celexa 40 mg daily for depression and anxiety.  Taper off gabapentin as this could be the causative factor in her hyponatremia with a side effect of dizziness and unsteadiness which could be contributing to her frequent falls (current dose of 300 mg QID decreased to 200 mg QID, see treatment plan below).  HPI:  On admission:  [name] is a 73 y.o. female brought to the ED from home by her family with a chief complaint of persistent headache and now visual hallucinations.  Patient had a fall with minor head injury approximately 12 days ago.  He was  seen initially with negative CT head.  She has returned on 6/22 and 6/27 for persistent headaches.  Tonight her family brought her in under IVC because she is now hallucinating that her legs are green.  Patient denies any knowledge of this but states "I guess I am because that is what they are telling me".  Denies active SI/HI/AH.  States headache is generally better.  Family also reported that patient has been having more frequent falls and generalized weakness.  Patient found to have hyponatremia of 117, admitted to the medical  floor.  Past Psychiatric History: depression, anxiety  Risk to Self:  none Risk to Others:  none Prior Inpatient Therapy:  none Prior Outpatient Therapy:  PCP  Past Medical History:  Past Medical History:  Diagnosis Date  . Allergy   . Asthma   . Back pain   . Bronchitis   . COPD (chronic obstructive pulmonary disease) ([place])   . Degenerative disc disease, lumbar   . Depression   . FHx: cholecystectomy   . GERD (gastroesophageal reflux disease)   . H/O: hysterectomy   . Headache   . Hypertension   . Murmur   . Osteoporosis   . Pneumonia 03/31/16   being treated by PCP  . Restless legs syndrome   . Scoliosis   . Shingles   . Thyroid disease     Past Surgical History:  Procedure Laterality Date  . ABDOMINAL HYSTERECTOMY    . APPENDECTOMY    . BACK SURGERY    . CHOLECYSTECTOMY    . COLONOSCOPY WITH PROPOFOL N/A 01/16/2018   Procedure: COLONOSCOPY WITH PROPOFOL;  Surgeon: Toledo, Benay Pike, MD;  Location: [facility] ENDOSCOPY;  Service: Gastroenterology;  Laterality: N/A;  . ESOPHAGOGASTRODUODENOSCOPY (EGD) WITH PROPOFOL N/A 01/16/2018   Procedure: ESOPHAGOGASTRODUODENOSCOPY (EGD) WITH PROPOFOL;  Surgeon: Toledo, Benay Pike, MD;  Location: [facility] ENDOSCOPY;  Service: Gastroenterology;  Laterality: N/A;  . JOINT REPLACEMENT Left 1998   shoulder  . NECK SURGERY Bilateral   . ROTATOR CUFF REPAIR Right   . SHOULDER OPEN ROTATOR CUFF REPAIR Right   . SHOULDER SURGERY Left    replacement   Family History:  Family History  Problem Relation Age of Onset  . Kidney disease Father   . Heart disease Mother   . Diabetes Mother    Family Psychiatric  History: none Social History:  Social History   Substance and Sexual Activity  Alcohol Use No  . Alcohol/week: 0.0 standard drinks     Social History   Substance and Sexual Activity  Drug Use No    Social History   Socioeconomic History  . Marital status: Married    Spouse name: Not on file  . Number of children: Not on file   . Years of education: Not on file  . Highest education level: Not on file  Occupational History  . Not on file  Social Needs  . Financial resource strain: Not on file  . Food insecurity    Worry: Not on file    Inability: Not on file  . Transportation needs    Medical: Not on file    Non-medical: Not on file  Tobacco Use  . Smoking status: Former Smoker    Packs/day: 1.00    Years: 30.00    Pack years: 30.00    Types: Cigarettes    Quit date: 11/04/2016    Years since quitting: 2.4  . Smokeless tobacco: Never Used  Substance and Sexual Activity  . Alcohol use: No  Alcohol/week: 0.0 standard drinks  . Drug use: No  . Sexual activity: Not on file  Lifestyle  . Physical activity    Days per week: Not on file    Minutes per session: Not on file  . Stress: Not on file  Relationships  . Social Herbalist on phone: Not on file    Gets together: Not on file    Attends religious service: Not on file    Active member of club or organization: Not on file    Attends meetings of clubs or organizations: Not on file    Relationship status: Not on file  Other Topics Concern  . Not on file  Social History Narrative  . Not on file   Additional Social History:    Allergies:   Allergies  Allergen Reactions  . Adhesive [Tape] Other (See Comments)  . Other Rash    Plastic tape    Labs:  Results for orders placed or performed during the hospital encounter of 04/27/19 (from the past 48 hour(s))  Sodium     Status: Abnormal   Collection Time: 04/29/19  4:47 PM  Result Value Ref Range   Sodium 126 (L) 135 - 145 mmol/L    Comment: Performed at [facility], [address] [place], [place] [address]  Sodium     Status: Abnormal   Collection Time: 04/29/19  8:00 PM  Result Value Ref Range   Sodium 127 (L) 135 - 145 mmol/L    Comment: Performed at [facility], [address] [place], [place] [address]  Sodium     Status: Abnormal   Collection  Time: 04/29/19 11:20 PM  Result Value Ref Range   Sodium 127 (L) 135 - 145 mmol/L    Comment: Performed at [facility], [address] [place], [place] [address]  Magnesium     Status: Abnormal   Collection Time: 04/30/19 12:00 AM  Result Value Ref Range   Magnesium 2.5 (H) 1.7 - 2.4 mg/dL    Comment: Performed at [facility], [address] [place], [place] [address]  CBC     Status: Abnormal   Collection Time: 04/30/19  5:31 AM  Result Value Ref Range   WBC 9.9 4.0 - 10.5 K/uL   RBC 4.23 3.87 - 5.11 MIL/uL   Hemoglobin 11.8 (L) 12.0 - 15.0 g/dL   HCT 35.7 (L) 36.0 - 46.0 %   MCV 84.4 80.0 - 100.0 fL   MCH 27.9 26.0 - 34.0 pg   MCHC 33.1 30.0 - 36.0 g/dL   RDW 14.4 11.5 - 15.5 %   Platelets 334 150 - 400 K/uL   nRBC 0.0 0.0 - 0.2 %    Comment: Performed at [facility], [address] [place], [place] [address]  Basic metabolic panel     Status: Abnormal   Collection Time: 04/30/19  5:31 AM  Result Value Ref Range   Sodium 127 (L) 135 - 145 mmol/L   Potassium 3.4 (L) 3.5 - 5.1 mmol/L   Chloride 91 (L) 98 - 111 mmol/L   CO2 25 22 - 32 mmol/L   Glucose, Bld 82 70 - 99 mg/dL   BUN 7 (L) 8 - 23 mg/dL   Creatinine, Ser 0.51 0.44 - 1.00 mg/dL   Calcium 8.5 (L) 8.9 - 10.3 mg/dL   GFR calc non Af Amer >60 >60 mL/min   GFR calc Af Amer >60 >60 mL/min   Anion gap  11 5 - 15    Comment: Performed at [facility], [address] [place], [place] [address]  Sodium     Status: Abnormal   Collection Time: 04/30/19  1:01 PM  Result Value Ref Range   Sodium 127 (L) 135 - 145 mmol/L    Comment: Performed at [facility], [address] [place], [place] [address]  Phosphorus     Status: Abnormal   Collection Time: 04/30/19  1:01 PM  Result Value Ref Range   Phosphorus 2.2 (L) 2.5 - 4.6 mg/dL    Comment: Performed at [facility], [address] [place], [place] [address]  Sodium     Status: Abnormal   Collection Time:  04/30/19  3:45 PM  Result Value Ref Range   Sodium 128 (L) 135 - 145 mmol/L    Comment: Performed at [facility], [address] [place], [place] [address]  Sodium     Status: Abnormal   Collection Time: 04/30/19  7:46 PM  Result Value Ref Range   Sodium 127 (L) 135 - 145 mmol/L    Comment: Performed at [facility], [address] [place], [place] [address]  Sodium     Status: Abnormal   Collection Time: 04/30/19 11:44 PM  Result Value Ref Range   Sodium 127 (L) 135 - 145 mmol/L    Comment: Performed at [facility], [address] [place], [place] [address]  Sodium     Status: Abnormal   Collection Time: 05/01/19  3:42 AM  Result Value Ref Range   Sodium 127 (L) 135 - 145 mmol/L    Comment: Performed at [facility], [address] [place], [place] [address]  Basic metabolic panel     Status: Abnormal   Collection Time: 05/01/19  3:42 AM  Result Value Ref Range   Sodium 127 (L) 135 - 145 mmol/L   Potassium 4.0 3.5 - 5.1 mmol/L   Chloride 92 (L) 98 - 111 mmol/L   CO2 29 22 - 32 mmol/L   Glucose, Bld 89 70 - 99 mg/dL   BUN 9 8 - 23 mg/dL   Creatinine, Ser 0.50 0.44 - 1.00 mg/dL   Calcium 8.6 (L) 8.9 - 10.3 mg/dL   GFR calc non Af Amer >60 >60 mL/min   GFR calc Af Amer >60 >60 mL/min   Anion gap 6 5 - 15    Comment: Performed at [facility], [address] [place], [place] [address]  CBC     Status: Abnormal   Collection Time: 05/01/19  3:42 AM  Result Value Ref Range   WBC 13.3 (H) 4.0 - 10.5 K/uL   RBC 4.20 3.87 - 5.11 MIL/uL   Hemoglobin 11.7 (L) 12.0 - 15.0 g/dL   HCT 35.6 (L) 36.0 - 46.0 %   MCV 84.8 80.0 - 100.0 fL   MCH 27.9 26.0 - 34.0 pg   MCHC 32.9 30.0 - 36.0 g/dL   RDW 14.6 11.5 - 15.5 %   Platelets 314 150 - 400 K/uL   nRBC 0.0 0.0 - 0.2 %    Comment: Performed at [facility], [address] [place], [place] [address]  Magnesium     Status: None   Collection Time: 05/01/19  3:42 AM   Result Value Ref Range   Magnesium 2.1 1.7 - 2.4 mg/dL    Comment: Performed at [facility], [address] [place], [place] [address]  Sodium  Status: Abnormal   Collection Time: 05/01/19  7:47 AM  Result Value Ref Range   Sodium 129 (L) 135 - 145 mmol/L    Comment: Performed at [facility], [address] [place], [place] [address]  Sodium     Status: Abnormal   Collection Time: 05/01/19 12:00 PM  Result Value Ref Range   Sodium 125 (L) 135 - 145 mmol/L    Comment: Performed at [facility], [address], [place], [place] [address]    Current Facility-Administered Medications  Medication Dose Route Frequency Provider Last Rate Last Dose  . acetaminophen (TYLENOL) tablet 650 mg  650 mg Oral Q6H PRN Harrie Foreman, MD   650 mg at 05/01/19 1045   Or  . acetaminophen (TYLENOL) suppository 650 mg  650 mg Rectal Q6H PRN Harrie Foreman, MD      . albuterol (PROVENTIL) (2.5 MG/3ML) 0.083% nebulizer solution 2.5 mg  2.5 mg Nebulization Q4H PRN Harrie Foreman, MD   2.5 mg at 04/28/19 0931  . amLODipine (NORVASC) tablet 5 mg  5 mg Oral Daily Harrie Foreman, MD   5 mg at 05/01/19 1046  . cholecalciferol (VITAMIN D) tablet 2,000 Units  2,000 Units Oral Daily Harrie Foreman, MD   2,000 Units at 05/01/19 1044  . citalopram (CELEXA) tablet 20 mg  20 mg Oral Daily Aneesh Faller, MD   20 mg at 05/01/19 1045  . docusate sodium (COLACE) capsule 100 mg  100 mg Oral BID Harrie Foreman, MD   100 mg at 05/01/19 1045  . enoxaparin (LOVENOX) injection 40 mg  40 mg Subcutaneous Q12H Harrie Foreman, MD   40 mg at 05/01/19 1047  . furosemide (LASIX) tablet 20 mg  20 mg Oral Daily Lateef, Munsoor, MD   20 mg at 05/01/19 1421  . gabapentin (NEURONTIN) capsule 200 mg  200 mg Oral BID Laurana Magistro, MD   200 mg at 05/01/19 1043  . hydrALAZINE (APRESOLINE) injection 10 mg  10 mg Intravenous Q6H PRN Ojie, Jude, MD      . levothyroxine (SYNTHROID)  tablet 112 mcg  112 mcg Oral QAC breakfast Harrie Foreman, MD   112 mcg at 05/01/19 0530  . metoprolol tartrate (LOPRESSOR) tablet 100 mg  100 mg Oral BID Harrie Foreman, MD   100 mg at 05/01/19 1045  . multivitamin with minerals tablet 1 tablet  1 tablet Oral Daily Harrie Foreman, MD   1 tablet at 05/01/19 1045  . ondansetron (ZOFRAN) injection 4 mg  4 mg Intravenous Q6H PRN Ottie Glazier, MD   4 mg at 05/01/19 0903  . [START ON 05/02/2019] pantoprazole (PROTONIX) EC tablet 40 mg  40 mg Oral Daily Charlett Nose, RPH      . predniSONE (DELTASONE) tablet 40 mg  40 mg Oral Q breakfast Harrie Foreman, MD   40 mg at 05/01/19 0751  . sodium chloride flush (NS) 0.9 % injection 10-40 mL  10-40 mL Intracatheter Q12H Ojie, Jude, MD   10 mL at 05/01/19 1047  . sodium chloride flush (NS) 0.9 % injection 10-40 mL  10-40 mL Intracatheter PRN Ojie, Jude, MD      . sodium chloride tablet 1 g  1 g Oral TID WC Ottie Glazier, MD   1 g at 05/01/19 1249  . tiotropium (SPIRIVA) inhalation capsule ([facility] use ONLY) 18 mcg  18 mcg Inhalation Daily Harrie Foreman, MD   18 mcg at 05/01/19 1047  Musculoskeletal: Strength & Muscle Tone: decreased Gait & Station: did not witness Patient leans: N/A  Psychiatric Specialty Exam: Physical Exam  Nursing note and vitals reviewed. Constitutional: She is oriented to person, place, and time. She appears well-developed and well-nourished.  HENT:  Head: Normocephalic.  Neck: Normal range of motion.  Respiratory: Effort normal.  Neurological: She is alert and oriented to person, place, and time.  Psychiatric: Her speech is normal and behavior is normal. Judgment and thought content normal. Her mood appears anxious. Her affect is blunt. Cognition and memory are normal. She exhibits a depressed mood.    Review of Systems  Constitutional: Positive for malaise/fatigue.  Musculoskeletal: Positive for falls and neck pain.  Neurological: Positive for  headaches.  Psychiatric/Behavioral: Positive for depression. The patient is nervous/anxious.   All other systems reviewed and are negative.   Blood pressure 112/75, pulse 79, temperature 98.3 F (36.8 C), temperature source Oral, resp. rate 20, height 4\' 10"  (1.473 m), weight 93.3 kg, SpO2 92 %.Body mass index is 42.99 kg/m.  General Appearance: Casual  Eye Contact:  poor  Speech:  slow  Volume:  low  Mood: dysphoric  Affect:  Blunt  Thought Process:  Coherent and Descriptions of Associations: Intact  Orientation:  Full (Time, Place, and Person)  Thought Content:  WDL and Logical  Suicidal Thoughts:  No  Homicidal Thoughts:  No    Judgement:  improving  Insight:  improving  Psychomotor Activity:  Decreased  Concentration:  Concentration: Fair and Attention Span: Fair  Recall:  [employer] of Knowledge: fair  Language: fair  Akathisia:  No  Handed:  Right  AIMS (if indicated):     Assets:  Housing Leisure Time Resilience Social Support  ADL's:  Impaired  Cognition:  WNL  Sleep:   Fair     Treatment Plan Summary: Daily contact with patient to assess and evaluate symptoms and progress in treatment, Medication management and Plan hallucinations, visual:  -Improved with no hallucinations today -Decrease Celexa to 10mg  daily, continue at this dose.. Will continue to monitor.   Falls and Hyponatremia -. Decrease Gabapentin to 200 mg at bedtime,  Continue this dose until she can follow up with a psychiatrist.  Patient has been progressively doing well and at this time is fully oriented and has not had hallucinations in 48 hours. We will provide her with resources to follow-up with outpatient psychiatry and therapy. We will sign off at this time.  Disposition: No evidence of imminent risk to self or others at present.   Patient does not meet criteria for psychiatric inpatient admission. Supportive therapy provided about ongoing stressors.  Elvin So, MD 05/01/2019  4:22 PM

## 2019-05-01 NOTE — Progress Notes (Signed)
[facility] [place] NAME: Alicianna Litchford    MR#:  [id number]  DATE OF BIRTH:  18-Jun-1946  SUBJECTIVE:  CHIEF COMPLAINT:   Chief Complaint  Patient presents with  . Headache  . Altered Mental Status   No new complaint this morning.  Patient awake and alert this morning.  3% saline previously discontinued due to improvement in hyponatremia.  Patient awaiting bed for transfer out of ICU  REVIEW OF SYSTEMS:  Review of Systems  Constitutional: Negative for chills and fever.  HENT: Negative for hearing loss and tinnitus.   Eyes: Negative for blurred vision and double vision.  Respiratory: Negative for cough and shortness of breath.   Cardiovascular: Negative for chest pain and palpitations.  Gastrointestinal: Negative for heartburn, nausea and vomiting.  Genitourinary: Negative for dysuria and urgency.  Musculoskeletal: Negative for myalgias and neck pain.  Skin: Negative for itching and rash.  Neurological: Negative for dizziness and headaches.  Psychiatric/Behavioral: Negative for depression.       Confusion appears improved    DRUG ALLERGIES:   Allergies  Allergen Reactions  . Adhesive [Tape] Other (See Comments)  . Other Rash    Plastic tape   VITALS:  Blood pressure (!) 125/91, pulse 94, temperature 98.3 F (36.8 C), temperature source Oral, resp. rate 17, height 4\' 10"  (1.473 m), weight 93.3 kg, SpO2 92 %. PHYSICAL EXAMINATION:   Physical Exam  Constitutional: She is oriented to person, place, and time. She appears well-developed and well-nourished.  HENT:  Head: Normocephalic and atraumatic.  Right Ear: External ear normal.  Eyes: Pupils are equal, round, and reactive to light. Conjunctivae are normal. Right eye exhibits no discharge.  Neck: Normal range of motion. Neck supple. No thyromegaly present.  Cardiovascular: Normal rate, regular rhythm and normal heart sounds.  Respiratory: Effort normal and breath sounds  normal. She has no wheezes.  GI: Soft. Bowel sounds are normal. She exhibits no distension.  Musculoskeletal: Normal range of motion.        General: No edema.  Neurological: She is alert and oriented to person, place, and time.  Moving all extremities with no focal deficit  Skin: Skin is warm. She is not diaphoretic. No erythema.  Psychiatric: She has a normal mood and affect. Her behavior is normal.   LABORATORY PANEL:  Female CBC Recent Labs  Lab 05/01/19 0342  WBC 13.3*  HGB 11.7*  HCT 35.6*  PLT 314   ------------------------------------------------------------------------------------------------------------------ Chemistries  Recent Labs  Lab 04/27/19 0340  05/01/19 0342  05/01/19 1200  NA 117*   < > 127*  127*   < > 125*  K 3.8   < > 4.0  --   --   CL 83*   < > 92*  --   --   CO2 23   < > 29  --   --   GLUCOSE 104*   < > 89  --   --   BUN 8   < > 9  --   --   CREATININE 0.39*   < > 0.50  --   --   CALCIUM 8.8*   < > 8.6*  --   --   MG  --    < > 2.1  --   --   AST 24  --   --   --   --   ALT 18  --   --   --   --  ALKPHOS 78  --   --   --   --   BILITOT 0.6  --   --   --   --    < > = values in this interval not displayed.   RADIOLOGY:  No results found. ASSESSMENT AND PLAN:   1.  Hyponatremia: No seizure. Reported to have intermittent confusion and hallucinations Sodium level was 117 on presentation.  Evaluated by nephrologist.  Work-up so far suggestive of underlying SIADH.   Patient started on 3% saline previously.  3% saline discontinued previously due to improvement in sodium level .sodium level of 125 today. CT chest to rule out underlying lung mass done revealed small focal inflammatory changes in the left lung apex but no evidence of malignancy in the lungs. Serum osmolality of 245.  Urine osmolality of 335.  Urine sodium of 98.  TSH level normal.  Cortisol level of 37.2. CT scan of the head without acute findings.  Clinically no evidence of  infectious process found.  2.  Acute metabolic encephalopathy; Likely related to hyponatremia.  Patient was having intermittent hallucinations.  Hallucinations Patient was seen by psychiatry service due to hallucinations.  Notified by nursing staff.IVC was initiated on admission due to hallucinations as well. Psychiatric service gradually tapering off Celexa and gabapentin which can cause hyponatremia.  Appreciate psychiatry input.  I updated patient's husband and granddaughter; [place] recently who lives with the patient at home and she confirmed she took out the IVC papers because over the last 3 months patient has been progressively getting more confused and having recurrent falls at home.  Family having difficulty with caring for patient at home as well. Patient would likely benefit from skilled nursing facility placement on discharge  3.  Hypertension:.  Continue metoprolol and amlodipine.  Placed on PRN IV hydralazine with parameters  4.  Hypothyroidism: TSH level normal.  continue Synthroid  5.  Falls: Fall precautions. CT of the head with no acute findings. PT and OT evaluation requested Patient with most likely benefit from skilled nursing facility placement on discharge from the hospital.    DVT prophylaxis: Lovenox  All the records are reviewed and case discussed with Care Management/Social Worker. Management plans discussed with the patient, family and they are in agreement.  CODE STATUS: Full Code  TOTAL TIME TAKING CARE OF THIS PATIENT: 35 minutes.   More than 50% of the time was spent in counseling/coordination of care: YES  POSSIBLE D/C IN 2 DAYS, DEPENDING ON CLINICAL CONDITION.   Joshua Zeringue M.D on 05/01/2019 at 1:52 PM  Between 7am to 6pm - Pager - [phone]  After 6pm go to [email or web address] - Proofreader  [facility]  Office  [phone]  CC: Primary care physician; Baxter Hire, MD  Note: This dictation was prepared  with Dragon dictation along with smaller phrase technology. Any transcriptional errors that result from this process are unintentional.

## 2019-05-02 ENCOUNTER — Other Ambulatory Visit: Payer: Self-pay

## 2019-05-02 DIAGNOSIS — R443 Hallucinations, unspecified: Secondary | ICD-10-CM

## 2019-05-02 DIAGNOSIS — E871 Hypo-osmolality and hyponatremia: Secondary | ICD-10-CM

## 2019-05-02 LAB — CBC
HCT: 33.8 % — ABNORMAL LOW (ref 36.0–46.0)
Hemoglobin: 11.1 g/dL — ABNORMAL LOW (ref 12.0–15.0)
MCH: 28 pg (ref 26.0–34.0)
MCHC: 32.8 g/dL (ref 30.0–36.0)
MCV: 85.1 fL (ref 80.0–100.0)
Platelets: 295 10*3/uL (ref 150–400)
RBC: 3.97 MIL/uL (ref 3.87–5.11)
RDW: 14.6 % (ref 11.5–15.5)
WBC: 11.1 10*3/uL — ABNORMAL HIGH (ref 4.0–10.5)
nRBC: 0 % (ref 0.0–0.2)

## 2019-05-02 LAB — SODIUM
Sodium: 126 mmol/L — ABNORMAL LOW (ref 135–145)
Sodium: 128 mmol/L — ABNORMAL LOW (ref 135–145)
Sodium: 129 mmol/L — ABNORMAL LOW (ref 135–145)
Sodium: 129 mmol/L — ABNORMAL LOW (ref 135–145)

## 2019-05-02 LAB — BASIC METABOLIC PANEL
Anion gap: 7 (ref 5–15)
BUN: 10 mg/dL (ref 8–23)
CO2: 30 mmol/L (ref 22–32)
Calcium: 8.6 mg/dL — ABNORMAL LOW (ref 8.9–10.3)
Chloride: 92 mmol/L — ABNORMAL LOW (ref 98–111)
Creatinine, Ser: 0.62 mg/dL (ref 0.44–1.00)
GFR calc Af Amer: >60 mL/min (ref >60)
GFR calc non Af Amer: >60 mL/min (ref >60)
Glucose, Bld: 86 mg/dL (ref 70–99)
Potassium: 3.7 mmol/L (ref 3.5–5.1)
Sodium: 129 mmol/L — ABNORMAL LOW (ref 135–145)

## 2019-05-02 LAB — MAGNESIUM: Magnesium: 2.1 mg/dL (ref 1.7–2.4)

## 2019-05-02 LAB — PHOSPHORUS: Phosphorus: 3.6 mg/dL (ref 2.5–4.6)

## 2019-05-02 MED ORDER — POTASSIUM CHLORIDE CRYS ER 20 MEQ PO TBCR
40.0000 meq | EXTENDED_RELEASE_TABLET | Freq: Once | ORAL | Status: AC
  Administered 2019-05-02: 17:00:00 40 meq via ORAL
  Filled 2019-05-02: qty 2

## 2019-05-02 NOTE — Consult Note (Signed)
[facility] Face-to-Face Psychiatry Consult   Reason for Consult:  Hallucinations  Referring Physician:  Dr Stark Jock Patient Identification: [name] MRN:  [id number] Principal Diagnosis: Hyponatremia Diagnosis:  Active Problems:   Hallucinations, visual   Hyponatremia  Total Time spent with patient: 15 minutes  05/02/19:  Patient seen in her room today,jovial and chatting amicably.  No suicidal/homicidal ideations, no confusion or hallucinations.  Psychiatrist cleared her yesterday but consult not completed on Epic, this was done by this provider.  05/01/2019-patient seen today in her room.  She is alert and oriented and sitting up in a chair.  She states that she is feeling much better.  She has not had any hallucinations.  She is able to tell us the date and the year.  States that she had gotten somewhat mixed up and has been feeling much better.  She is waiting for a bed to be able to move out of the ICU. It was discussed with her about her history of depression and seeing a therapist and she is agreeable to giving that some thought.  04/30/2019-Patient seen today and presents much more alert and oriented.  She has not had any hallucinations.  She was able to talk about some events in her life including her daughter's death from cancer that is causing her some distress.  Her current confusion seems to have cleared up. Her Celexa was decreased to 20 mg and gabapentin to 200 mg twice daily. Plan to decrease Celexa to 10 mg tomorrow and the gabapentin to 200 mg once daily  04/29/2019-patient was transferred to the ICU early this morning and is critically ill with confusion and lethargy.  Per critical care patient has hyponatremia possibly due to SIADH.  Will decrease dose of Celexa to 20mg  and taper down completely since it may have a role in causing her SIADH in turn leading to the confusion.  04/28/2019-patient is a 73 year old female who was initially admitted for hyponatremia and hallucinations.  She  was sleeping  this clinician saw this morning.  She did wake up on calling her name and  stated that she was doing  better.  She stated that she was all mixed up at that facility that she came to the hospital.  Per her sitter she was much more alert and oriented this morning.  She seems to be improving. We will continue the Celexa 40 mg for her depression and anxiety.  04/27/2019- ANACAREN KOHAN is a 73 y.o. female patient seen and evaluated for hallucinations.  When asked about hallucinations, she responded with "I would if could tell you what they were.  My children told me yesterday that I was seeing things, green things."  Denies current hallucinations with, "Well duh, no."  Confounding variables of hyponatremia, recent head injury, and prednisone use daily.  Her TSH level is in the normal limit.  Not responding on assessment.  Endorses depression of "not bad", taking Celexa.  Anxiety of "yes", mild.  Denies suicidal/homicidal ideations and substance abuse.  No medication recommendation at this time for her hallucinations as this issue appears to have resolved.  Continue Celexa 40 mg daily for depression and anxiety.  Taper off gabapentin as this could be the causative factor in her hyponatremia with a side effect of dizziness and unsteadiness which could be contributing to her frequent falls (current dose of 300 mg QID decreased to 200 mg QID, see treatment plan below).  HPI:  On admission:  [name] is a 73 y.o.  female brought to the ED from home by her family with a chief complaint of persistent headache and now visual hallucinations.  Patient had a fall with minor head injury approximately 12 days ago.  He was seen initially with negative CT head.  She has returned on 6/22 and 6/27 for persistent headaches.  Tonight her family brought her in under IVC because she is now hallucinating that her legs are green.  Patient denies any knowledge of this but states "I guess I am because that is what they are  telling me".  Denies active SI/HI/AH.  States headache is generally better.  Family also reported that patient has been having more frequent falls and generalized weakness.  Patient found to have hyponatremia of 117, admitted to the medical floor.  Past Psychiatric History: depression, anxiety  Risk to Self:  none Risk to Others:  none Prior Inpatient Therapy:  none Prior Outpatient Therapy:  PCP  Past Medical History:  Past Medical History:  Diagnosis Date  . Allergy   . Asthma   . Back pain   . Bronchitis   . COPD (chronic obstructive pulmonary disease) ([place])   . Degenerative disc disease, lumbar   . Depression   . FHx: cholecystectomy   . GERD (gastroesophageal reflux disease)   . H/O: hysterectomy   . Headache   . Hypertension   . Murmur   . Osteoporosis   . Pneumonia 03/31/16   being treated by PCP  . Restless legs syndrome   . Scoliosis   . Shingles   . Thyroid disease     Past Surgical History:  Procedure Laterality Date  . ABDOMINAL HYSTERECTOMY    . APPENDECTOMY    . BACK SURGERY    . CHOLECYSTECTOMY    . COLONOSCOPY WITH PROPOFOL N/A 01/16/2018   Procedure: COLONOSCOPY WITH PROPOFOL;  Surgeon: Toledo, Benay Pike, MD;  Location: [facility] ENDOSCOPY;  Service: Gastroenterology;  Laterality: N/A;  . ESOPHAGOGASTRODUODENOSCOPY (EGD) WITH PROPOFOL N/A 01/16/2018   Procedure: ESOPHAGOGASTRODUODENOSCOPY (EGD) WITH PROPOFOL;  Surgeon: Toledo, Benay Pike, MD;  Location: [facility] ENDOSCOPY;  Service: Gastroenterology;  Laterality: N/A;  . JOINT REPLACEMENT Left 1998   shoulder  . NECK SURGERY Bilateral   . ROTATOR CUFF REPAIR Right   . SHOULDER OPEN ROTATOR CUFF REPAIR Right   . SHOULDER SURGERY Left    replacement   Family History:  Family History  Problem Relation Age of Onset  . Kidney disease Father   . Heart disease Mother   . Diabetes Mother    Family Psychiatric  History: none Social History:  Social History   Substance and Sexual Activity  Alcohol Use No  .  Alcohol/week: 0.0 standard drinks     Social History   Substance and Sexual Activity  Drug Use No    Social History   Socioeconomic History  . Marital status: Married    Spouse name: Not on file  . Number of children: Not on file  . Years of education: Not on file  . Highest education level: Not on file  Occupational History  . Not on file  Social Needs  . Financial resource strain: Not on file  . Food insecurity    Worry: Not on file    Inability: Not on file  . Transportation needs    Medical: Not on file    Non-medical: Not on file  Tobacco Use  . Smoking status: Former Smoker    Packs/day: 1.00    Years: 30.00  Pack years: 30.00    Types: Cigarettes    Quit date: 11/04/2016    Years since quitting: 2.4  . Smokeless tobacco: Never Used  Substance and Sexual Activity  . Alcohol use: No    Alcohol/week: 0.0 standard drinks  . Drug use: No  . Sexual activity: Not on file  Lifestyle  . Physical activity    Days per week: Not on file    Minutes per session: Not on file  . Stress: Not on file  Relationships  . Social Herbalist on phone: Not on file    Gets together: Not on file    Attends religious service: Not on file    Active member of club or organization: Not on file    Attends meetings of clubs or organizations: Not on file    Relationship status: Not on file  Other Topics Concern  . Not on file  Social History Narrative  . Not on file   Additional Social History:    Allergies:   Allergies  Allergen Reactions  . Adhesive [Tape] Other (See Comments)  . Other Rash    Plastic tape    Labs:  Results for orders placed or performed during the hospital encounter of 04/27/19 (from the past 48 hour(s))  Sodium     Status: Abnormal   Collection Time: 04/30/19  3:45 PM  Result Value Ref Range   Sodium 128 (L) 135 - 145 mmol/L    Comment: Performed at [facility], [address] [place], [place] [address]  Sodium     Status:  Abnormal   Collection Time: 04/30/19  7:46 PM  Result Value Ref Range   Sodium 127 (L) 135 - 145 mmol/L    Comment: Performed at [facility], [address] [place], [place] [address]  Sodium     Status: Abnormal   Collection Time: 04/30/19 11:44 PM  Result Value Ref Range   Sodium 127 (L) 135 - 145 mmol/L    Comment: Performed at [facility], [address] [place], [place] [address]  Sodium     Status: Abnormal   Collection Time: 05/01/19  3:42 AM  Result Value Ref Range   Sodium 127 (L) 135 - 145 mmol/L    Comment: Performed at [facility], [address] [place], [place] [address]  Basic metabolic panel     Status: Abnormal   Collection Time: 05/01/19  3:42 AM  Result Value Ref Range   Sodium 127 (L) 135 - 145 mmol/L   Potassium 4.0 3.5 - 5.1 mmol/L   Chloride 92 (L) 98 - 111 mmol/L   CO2 29 22 - 32 mmol/L   Glucose, Bld 89 70 - 99 mg/dL   BUN 9 8 - 23 mg/dL   Creatinine, Ser 0.50 0.44 - 1.00 mg/dL   Calcium 8.6 (L) 8.9 - 10.3 mg/dL   GFR calc non Af Amer >60 >60 mL/min   GFR calc Af Amer >60 >60 mL/min   Anion gap 6 5 - 15    Comment: Performed at [facility], [address] [place], [place] [address]  CBC     Status: Abnormal   Collection Time: 05/01/19  3:42 AM  Result Value Ref Range   WBC 13.3 (H) 4.0 - 10.5 K/uL   RBC 4.20 3.87 - 5.11 MIL/uL   Hemoglobin 11.7 (L) 12.0 - 15.0 g/dL   HCT 35.6 (L) 36.0 - 46.0 %   MCV 84.8  80.0 - 100.0 fL   MCH 27.9 26.0 - 34.0 pg   MCHC 32.9 30.0 - 36.0 g/dL   RDW 14.6 11.5 - 15.5 %   Platelets 314 150 - 400 K/uL   nRBC 0.0 0.0 - 0.2 %    Comment: Performed at [facility], [address], [place], [place] [address]  Magnesium     Status: None   Collection Time: 05/01/19  3:42 AM  Result Value Ref Range   Magnesium 2.1 1.7 - 2.4 mg/dL    Comment: Performed at [facility], [address] [place], [name] [address]  Sodium     Status: Abnormal   Collection  Time: 05/01/19  7:47 AM  Result Value Ref Range   Sodium 129 (L) 135 - 145 mmol/L    Comment: Performed at [facility], [address] [place], [place] [address]  Sodium     Status: Abnormal   Collection Time: 05/01/19 12:00 PM  Result Value Ref Range   Sodium 125 (L) 135 - 145 mmol/L    Comment: Performed at [facility], [address], [place], [place] [address]  Sodium     Status: Abnormal   Collection Time: 05/01/19  4:00 PM  Result Value Ref Range   Sodium 125 (L) 135 - 145 mmol/L    Comment: Performed at [facility], [address] [place], [place] [address]  Sodium     Status: Abnormal   Collection Time: 05/01/19  7:33 PM  Result Value Ref Range   Sodium 128 (L) 135 - 145 mmol/L    Comment: Performed at [facility], [address] [place], [place] [address]  Sodium     Status: Abnormal   Collection Time: 05/01/19 11:44 PM  Result Value Ref Range   Sodium 126 (L) 135 - 145 mmol/L    Comment: Performed at [facility], [address] [place], [place] [address]  Sodium     Status: Abnormal   Collection Time: 05/02/19  3:44 AM  Result Value Ref Range   Sodium 128 (L) 135 - 145 mmol/L    Comment: Performed at [facility], [address] [place], [place] [address]  Basic metabolic panel     Status: Abnormal   Collection Time: 05/02/19  3:44 AM  Result Value Ref Range   Sodium 129 (L) 135 - 145 mmol/L   Potassium 3.7 3.5 - 5.1 mmol/L   Chloride 92 (L) 98 - 111 mmol/L   CO2 30 22 - 32 mmol/L   Glucose, Bld 86 70 - 99 mg/dL   BUN 10 8 - 23 mg/dL   Creatinine, Ser 0.62 0.44 - 1.00 mg/dL   Calcium 8.6 (L) 8.9 - 10.3 mg/dL   GFR calc non Af Amer >60 >60 mL/min   GFR calc Af Amer >60 >60 mL/min   Anion gap 7 5 - 15    Comment: Performed at [facility], [address], [place], [place] [address]  Magnesium     Status: None   Collection Time: 05/02/19  3:44 AM  Result Value Ref Range   Magnesium  2.1 1.7 - 2.4 mg/dL    Comment: Performed at [facility], [address] [place], [place] [address]  Phosphorus     Status: None   Collection Time: 05/02/19  3:44 AM  Result Value Ref Range   Phosphorus 3.6 2.5 - 4.6 mg/dL    Comment: Performed at [facility], [address] [place], [place] [address]  CBC     Status: Abnormal   Collection Time: 05/02/19  3:44 AM  Result Value Ref Range   WBC 11.1 (H) 4.0 - 10.5 K/uL   RBC 3.97 3.87 - 5.11 MIL/uL   Hemoglobin 11.1 (L) 12.0 - 15.0 g/dL   HCT 33.8 (L) 36.0 - 46.0 %   MCV 85.1 80.0 - 100.0 fL   MCH 28.0 26.0 - 34.0 pg   MCHC 32.8 30.0 - 36.0 g/dL   RDW 14.6 11.5 - 15.5 %   Platelets 295 150 - 400 K/uL   nRBC 0.0 0.0 - 0.2 %    Comment: Performed at [facility], [address] [place], [name] [address]  Sodium     Status: Abnormal   Collection Time: 05/02/19  8:53 AM  Result Value Ref Range   Sodium 129 (L) 135 - 145 mmol/L    Comment: Performed at [facility], [address] [place], [place] [address]    Current Facility-Administered Medications  Medication Dose Route Frequency Provider Last Rate Last Dose  . acetaminophen (TYLENOL) tablet 650 mg  650 mg Oral Q6H PRN Harrie Foreman, MD   650 mg at 05/02/19 0754   Or  . acetaminophen (TYLENOL) suppository 650 mg  650 mg Rectal Q6H PRN Harrie Foreman, MD      . albuterol (PROVENTIL) (2.5 MG/3ML) 0.083% nebulizer solution 2.5 mg  2.5 mg Nebulization Q4H PRN Harrie Foreman, MD   2.5 mg at 04/28/19 0931  . amLODipine (NORVASC) tablet 5 mg  5 mg Oral Daily Harrie Foreman, MD   5 mg at 05/02/19 0947  . cholecalciferol (VITAMIN D) tablet 2,000 Units  2,000 Units Oral Daily Harrie Foreman, MD   2,000 Units at 05/02/19 [phone]  . citalopram (CELEXA) tablet 10 mg  10 mg Oral Daily Ravi, Himabindu, MD   10 mg at 05/02/19 0948  . docusate sodium (COLACE) capsule 100 mg  100 mg Oral BID Harrie Foreman, MD   100 mg at 05/02/19  0947  . enoxaparin (LOVENOX) injection 40 mg  40 mg Subcutaneous Q12H Harrie Foreman, MD   40 mg at 05/02/19 0949  . furosemide (LASIX) tablet 20 mg  20 mg Oral Daily Lateef, Munsoor, MD   20 mg at 05/02/19 0947  . gabapentin (NEURONTIN) capsule 200 mg  200 mg Oral QHS Ravi, Himabindu, MD      . hydrALAZINE (APRESOLINE) injection 10 mg  10 mg Intravenous Q6H PRN Ojie, Jude, MD      . levothyroxine (SYNTHROID) tablet 112 mcg  112 mcg Oral QAC breakfast Harrie Foreman, MD   112 mcg at 05/02/19 0530  . metoprolol tartrate (LOPRESSOR) tablet 100 mg  100 mg Oral BID Harrie Foreman, MD   100 mg at 05/02/19 0949  . multivitamin with minerals tablet 1 tablet  1 tablet Oral Daily Harrie Foreman, MD   1 tablet at 05/02/19 [phone]  . ondansetron (ZOFRAN) injection 4 mg  4 mg Intravenous Q6H PRN Ottie Glazier, MD   4 mg at 05/02/19 1334  . pantoprazole (PROTONIX) EC tablet 40 mg  40 mg Oral Daily Charlett Nose, RPH   40 mg at 05/02/19 0951  . predniSONE (DELTASONE) tablet 40 mg  40 mg Oral Q breakfast Harrie Foreman, MD   40 mg at 05/02/19 0754  . sodium chloride flush (NS) 0.9 % injection 10-40 mL  10-40 mL Intracatheter  Q12H Stark Jock Jude, MD   20 mL at 05/02/19 0950  . sodium chloride flush (NS) 0.9 % injection 10-40 mL  10-40 mL Intracatheter PRN Ojie, Jude, MD      . sodium chloride tablet 1 g  1 g Oral TID WC Ottie Glazier, MD   1 g at 05/02/19 1325  . tiotropium (SPIRIVA) inhalation capsule ([facility] use ONLY) 18 mcg  18 mcg Inhalation Daily Harrie Foreman, MD   18 mcg at 05/02/19 0950    Musculoskeletal: Strength & Muscle Tone: decreased Gait & Station: did not witness Patient leans: N/A  Psychiatric Specialty Exam: Physical Exam  Nursing note and vitals reviewed. Constitutional: She is oriented to person, place, and time. She appears well-developed and well-nourished.  HENT:  Head: Normocephalic.  Neck: Normal range of motion.  Respiratory: Effort normal.   Neurological: She is alert and oriented to person, place, and time.  Psychiatric: Her speech is normal and behavior is normal. Judgment and thought content normal. Her affect is blunt. Cognition and memory are normal.    Review of Systems  Constitutional: Positive for malaise/fatigue.  Musculoskeletal: Positive for falls.  Psychiatric/Behavioral: Positive for depression.  All other systems reviewed and are negative.   Blood pressure (!) 151/85, pulse 72, temperature 98.6 F (37 C), temperature source Oral, resp. rate 17, height 4\' 10"  (1.473 m), weight 91.5 kg, SpO2 90 %.Body mass index is 42.16 kg/m.  General Appearance: Casual  Eye Contact:  Good  Speech:  Normal  Volume:  Normal  Mood: dysphoric  Affect:  Blunt  Thought Process:  Coherent and Descriptions of Associations: Intact  Orientation:  Full (Time, Place, and Person)  Thought Content:  WDL and Logical  Suicidal Thoughts:  No  Homicidal Thoughts:  No    Judgement:  Good  Insight:  Good  Psychomotor Activity:  Decreased  Concentration:  Good  Recall:  Good  Fund of Knowledge:  Good  Language: Good  Akathisia:  No  Handed:  Right  AIMS (if indicated):     Assets:  Housing Leisure Time Resilience Social Support  ADL's:  Impaired  Cognition:  WNL  Sleep:   Fair     Treatment Plan Summary: Daily contact with patient to assess and evaluate symptoms and progress in treatment, Medication management and Plan hallucinations, visual:  -Improved with no hallucinations today - Celexa to 10mg  daily, continue at this dose.Marland Kitchen Psychiatrically cleared at this time   Falls and Hyponatremia -. ContinueGabapentin to 200 mg at bedtime,  Continue this dose until she can follow up with a psychiatrist.  Patient has been progressively doing well and at this time is fully oriented and has not had hallucinations in 48 hours. We will provide her with resources to follow-up with outpatient psychiatry and therapy. We will sign off  at this time, this was placed by the psychiatrist on 7/2 but consult not removed, consult removed today  Disposition: No evidence of imminent risk to self or others at present.   Patient does not meet criteria for psychiatric inpatient admission. Supportive therapy provided about ongoing stressors.  Waylan Boga, NP 05/02/2019 2:25 PM

## 2019-05-02 NOTE — Progress Notes (Signed)
CRITICAL CARE NOTE      CHIEF COMPLAINT:   Lethargy, s/p head trauma post mechanical fall and with hyponatremia   SUBJECTIVE FINDINGS & SIGNIFICANT EVENTS   Mentation improved still with mild confusion , unclear baseline.   Hyponatremia  - hypotonic euvlemic hyponatremia due to SIADH secondary to SSRI with NSAIDs  -Na improved overnight, will optimize for transfer to [facility]  PAST MEDICAL HISTORY   Past Medical History:  Diagnosis Date  . Allergy   . Asthma   . Back pain   . Bronchitis   . COPD (chronic obstructive pulmonary disease) ([place])   . Degenerative disc disease, lumbar   . Depression   . FHx: cholecystectomy   . GERD (gastroesophageal reflux disease)   . H/O: hysterectomy   . Headache   . Hypertension   . Murmur   . Osteoporosis   . Pneumonia 03/31/16   being treated by PCP  . Restless legs syndrome   . Scoliosis   . Shingles   . Thyroid disease      SURGICAL HISTORY   Past Surgical History:  Procedure Laterality Date  . ABDOMINAL HYSTERECTOMY    . APPENDECTOMY    . BACK SURGERY    . CHOLECYSTECTOMY    . COLONOSCOPY WITH PROPOFOL N/A 01/16/2018   Procedure: COLONOSCOPY WITH PROPOFOL;  Surgeon: Toledo, Benay Pike, MD;  Location: [facility] ENDOSCOPY;  Service: Gastroenterology;  Laterality: N/A;  . ESOPHAGOGASTRODUODENOSCOPY (EGD) WITH PROPOFOL N/A 01/16/2018   Procedure: ESOPHAGOGASTRODUODENOSCOPY (EGD) WITH PROPOFOL;  Surgeon: Toledo, Benay Pike, MD;  Location: [facility] ENDOSCOPY;  Service: Gastroenterology;  Laterality: N/A;  . JOINT REPLACEMENT Left 1998   shoulder  . NECK SURGERY Bilateral   . ROTATOR CUFF REPAIR Right   . SHOULDER OPEN ROTATOR CUFF REPAIR Right   . SHOULDER SURGERY Left    replacement     FAMILY HISTORY   Family History  Problem Relation Age of Onset  .  Kidney disease Father   . Heart disease Mother   . Diabetes Mother      SOCIAL HISTORY   Social History   Tobacco Use  . Smoking status: Former Smoker    Packs/day: 1.00    Years: 30.00    Pack years: 30.00    Types: Cigarettes    Quit date: 11/04/2016    Years since quitting: 2.4  . Smokeless tobacco: Never Used  Substance Use Topics  . Alcohol use: No    Alcohol/week: 0.0 standard drinks  . Drug use: No     MEDICATIONS   Current Medication:  Current Facility-Administered Medications:  .  acetaminophen (TYLENOL) tablet 650 mg, 650 mg, Oral, Q6H PRN, 650 mg at 05/01/19 2056 **OR** acetaminophen (TYLENOL) suppository 650 mg, 650 mg, Rectal, Q6H PRN, Harrie Foreman, MD .  albuterol (PROVENTIL) (2.5 MG/3ML) 0.083% nebulizer solution 2.5 mg, 2.5 mg, Nebulization, Q4H PRN, Harrie Foreman, MD, 2.5 mg at 04/28/19 0931 .  amLODipine (NORVASC) tablet 5 mg, 5 mg, Oral, Daily, Harrie Foreman, MD, 5 mg at 05/01/19 1046 .  cholecalciferol (VITAMIN D) tablet 2,000 Units, 2,000 Units, Oral, Daily, Harrie Foreman, MD, 2,000 Units at 05/01/19 1044 .  citalopram (CELEXA) tablet 10 mg, 10 mg, Oral, Daily, Ravi, Himabindu, MD .  docusate sodium (COLACE) capsule 100 mg, 100 mg, Oral, BID, Harrie Foreman, MD, 100 mg at 05/01/19 2057 .  enoxaparin (LOVENOX) injection 40 mg, 40 mg, Subcutaneous, Q12H, Harrie Foreman, MD, 40 mg at 05/01/19 2058 .  furosemide (  LASIX) tablet 20 mg, 20 mg, Oral, Daily, Lateef, Munsoor, MD, 20 mg at 05/01/19 1421 .  gabapentin (NEURONTIN) capsule 200 mg, 200 mg, Oral, QHS, Ravi, Himabindu, MD .  hydrALAZINE (APRESOLINE) injection 10 mg, 10 mg, Intravenous, Q6H PRN, Ojie, Jude, MD .  levothyroxine (SYNTHROID) tablet 112 mcg, 112 mcg, Oral, QAC breakfast, Harrie Foreman, MD, 112 mcg at 05/02/19 0530 .  metoprolol tartrate (LOPRESSOR) tablet 100 mg, 100 mg, Oral, BID, Harrie Foreman, MD, 100 mg at 05/01/19 2058 .  multivitamin with minerals  tablet 1 tablet, 1 tablet, Oral, Daily, Harrie Foreman, MD, 1 tablet at 05/01/19 1045 .  ondansetron (ZOFRAN) injection 4 mg, 4 mg, Intravenous, Q6H PRN, Ottie Glazier, MD, 4 mg at 05/01/19 0903 .  pantoprazole (PROTONIX) EC tablet 40 mg, 40 mg, Oral, Daily, Charlett Nose, RPH .  predniSONE (DELTASONE) tablet 40 mg, 40 mg, Oral, Q breakfast, Harrie Foreman, MD, 40 mg at 05/01/19 0751 .  sodium chloride flush (NS) 0.9 % injection 10-40 mL, 10-40 mL, Intracatheter, Q12H, Ojie, Jude, MD, 20 mL at 05/01/19 2059 .  sodium chloride flush (NS) 0.9 % injection 10-40 mL, 10-40 mL, Intracatheter, PRN, Ojie, Jude, MD .  sodium chloride tablet 1 g, 1 g, Oral, TID WC, Lanney Gins, Jailine Lieder, MD, 1 g at 05/01/19 1647 .  tiotropium (SPIRIVA) inhalation capsule ([facility] use ONLY) 18 mcg, 18 mcg, Inhalation, Daily, Harrie Foreman, MD, 18 mcg at 05/01/19 1047    ALLERGIES   Adhesive [tape] and Other    REVIEW OF SYSTEMS     Unable to obtain due to lethargy  PHYSICAL EXAMINATION   Vitals:   05/02/19 0000 05/02/19 0400  BP: 118/67 (!) 151/85  Pulse: 69 72  Resp: 14 17  Temp: 98 F (36.7 C) 98.2 F (36.8 C)  SpO2: 96% 90%    GENERAL: Chronically ill-appearing confused and lethargic HEAD: Normocephalic, atraumatic.  EYES: Pupils equal, round, reactive to light.  No scleral icterus.  MOUTH: Moist mucosal membrane. NECK: Supple. No thyromegaly. No nodules. No JVD.  PULMONARY: CTAB CARDIOVASCULAR: S1 and S2. Regular rate and rhythm. No murmurs, rubs, or gallops.  GASTROINTESTINAL: Soft, nontender, non-distended. No masses. Positive bowel sounds. No hepatosplenomegaly.  MUSCULOSKELETAL: No swelling, clubbing, or edema.  NEUROLOGIC: Mild distress due to acute illness SKIN:intact,warm,dry   LABS AND IMAGING       LAB RESULTS: Recent Labs  Lab 04/30/19 0531  05/01/19 0342  05/01/19 1933 05/01/19 2344 05/02/19 0344  NA 127*   < > 127*  127*   < > 128* 126* 129*  128*  K  3.4*  --  4.0  --   --   --  3.7  CL 91*  --  92*  --   --   --  92*  CO2 25  --  29  --   --   --  30  BUN 7*  --  9  --   --   --  10  CREATININE 0.51  --  0.50  --   --   --  0.62  GLUCOSE 82  --  89  --   --   --  86   < > = values in this interval not displayed.   Recent Labs  Lab 04/30/19 0531 05/01/19 0342 05/02/19 0344  HGB 11.8* 11.7* 11.1*  HCT 35.7* 35.6* 33.8*  WBC 9.9 13.3* 11.1*  PLT 334 314 295     IMAGING RESULTS: No results found.  ASSESSMENT AND PLAN    -Multidisciplinary rounds held today  Altered mental status with confusion and lethargy -Due to severe hyponatremia - treatment as per hyponatremia protocol   Severe hypotonic euvolemic hyponatremia  -TSH normal  - no renal failure  - no thiazide use  - cortisol normal   - no known intracranial pathology to suspect reset osmostat  - non potomania induced   - suspect SIADH evidenced by decreased BUN with multiple meds on home MAR known to cause hyponatremia  - CT chest no lung mass - small area of rounded atelectasis in L apex and incidentally found large hiatal hernia with mass effect displacing aorta to right as well as extrinsically compressing LLL basal segments with resultant mild atelectasis.     ELECTROLYTES -follow labs as needed -replace as needed -pharmacy consultation   DVT/GI PRX ordered -SCDs  TRANSFUSIONS AS NEEDED MONITOR FSBS ASSESS the need for LABS as needed   Critical care provider statement:    Critical care time (minutes):  31   Critical care time was exclusive of:  Separately billable procedures and treating other patients   Critical care was necessary to treat or prevent imminent or life-threatening deterioration of the following conditions:  severe hyponatremia, altered mental status with lethargy, obesity, hiatal hernia, multiple comorbid conditions.    Critical care was time spent personally by me on the following activities:  Development of treatment plan with  patient or surrogate, discussions with consultants, evaluation of patient's response to treatment, examination of patient, obtaining history from patient or surrogate, ordering and performing treatments and interventions, ordering and review of laboratory studies and re-evaluation of patient's condition.  I assumed direction of critical care for this patient from another provider in my specialty: no    This document was prepared using Dragon voice recognition software and may include unintentional dictation errors.    Ottie Glazier, M.D.  Division of [facility]

## 2019-05-02 NOTE — Evaluation (Signed)
Physical Therapy Evaluation Patient Details Name: [name] MRN: [id number] DOB: 1946-06-18 Today's Date: 05/02/2019   History of Present Illness  Pt is 73 y.o. female presented to ED for headaches and visual hallucinations 04/27/2019. recent ED visits (6/16 for fall, 6/22 and 6/27 for headaches). Admitted for AMS and hyponatremia transferred to CCU 6/30 for desaturation, hallucinations and requiring 3% saline solution. head CT negative, Na 129 today. PMHx of COPD, depression, scoliosis, restless leg syndrome, GERD, degenertive disc disease, hypertension, osteoporosis    Clinical Impression  Patient alert, orientedx4, behavior WFLs during session. Pt able to provide clear PLOF information, and reported in the last 3 months she has had 6 falls.   Upon assessment patient demonstrated generalized weakness, gait and balance abnormalities, and decreased endurance. Bed mobility performed minA for trunk elevation and weight shift. Sit <>stand with RW and CGA (provided due to difficulty reaching floor from lowest setting of bed), and pt ambulated ~4ft with RW and CGA. Pt exhibited very decreased gait velocity, slouched posture and was most comfortable with elbows placed on RW instead of hands despite education.  Overall the patient demonstrated deficits (see "PT Problem List") that impede the patient's functional abilities, safety, and mobility and would benefit from skilled PT intervention. Though pt may be nearing baseline level of functioning, PT recommendation is STR to maximize pt independence, mobility, safety, and decrease risk of falls.      Follow Up Recommendations SNF    Equipment Recommendations  Rolling walker with 5" wheels    Recommendations for Other Services       Precautions / Restrictions Precautions Precautions: Fall Restrictions Weight Bearing Restrictions: No      Mobility  Bed Mobility Overal bed mobility: Needs Assistance Bed Mobility: Supine to Sit     Supine  to sit: Min assist     General bed mobility comments: for trunk elevation, weight shift  Transfers Overall transfer level: Needs assistance Equipment used: Rolling walker (2 wheeled) Transfers: Sit to/from Stand Sit to Stand: Min guard         General transfer comment: Able to perform with CGA provided, though not necessaily required (pt with short legs, difficulty reaching floor despite bed being in lowest position)  Ambulation/Gait Ambulation/Gait assistance: Min guard Gait Distance (Feet): 50 Feet Assistive device: Rolling walker (2 wheeled)       General Gait Details: very decreased gait velocity, slouched posture, most comfortable with elbows placed on RW instead of hands despite education.  Stairs            Wheelchair Mobility    Modified Rankin (Stroke Patients Only)       Balance                                             Pertinent Vitals/Pain Pain Assessment: No/denies pain    Home Living Family/patient expects to be discharged to:: Private residence Living Arrangements: Spouse/significant other(granddaughter) Available Help at Discharge: Family Type of Home: House Home Access: [place]: One [place]: Environmental consultant - 4 wheels;Toilet riser;Tub bench;Grab bars - toilet Additional Comments: Pt reported at least 6 falls in the last 6 months, several ED visits recently    Prior Function Level of Independence: Needs assistance   Gait / Transfers Assistance Needed: mod I with ambulation in home with walker  ADL's / Homemaking  Assistance Needed: assistance from grandaughter for supervision/minA for bathing, minA for dressing, family performs homemaking activities        Hand Dominance        Extremity/Trunk Assessment   Upper Extremity Assessment Upper Extremity Assessment: Defer to OT evaluation;Generalized weakness    Lower Extremity Assessment Lower Extremity Assessment: Generalized  weakness    Cervical / Trunk Assessment Cervical / Trunk Assessment: Kyphotic(scolisis (chronic))  Communication   Communication: Other (comment)(Pt needed extended time intermittently to respond or had to be asked 2-3 times for pt to hear/understand/respond)  Cognition Arousal/Alertness: Awake/alert Behavior During Therapy: WFL for tasks assessed/performed Overall Cognitive Status: Within Functional Limits for tasks assessed                                        General Comments      Exercises     Assessment/Plan    PT Assessment Patient needs continued PT services  PT Problem List Decreased strength;Decreased mobility;Decreased balance;Decreased activity tolerance;Obesity       PT Treatment Interventions DME instruction;Therapeutic exercise;Gait training;Balance training;Functional mobility training;Therapeutic activities;Patient/family education;Neuromuscular re-education    PT Goals (Current goals can be found in the Care Plan section)  Acute Rehab PT Goals Patient Stated Goal: to get better PT Goal Formulation: With patient Time For Goal Achievement: 05/16/19 Potential to Achieve Goals: Fair    Frequency Min 2X/week   Barriers to discharge        Co-evaluation               AM-PAC PT "6 Clicks" Mobility  Outcome Measure Help needed turning from your back to your side while in a flat bed without using bedrails?: A Little Help needed moving from lying on your back to sitting on the side of a flat bed without using bedrails?: A Little Help needed moving to and from a bed to a chair (including a wheelchair)?: A Little Help needed standing up from a chair using your arms (e.g., wheelchair or bedside chair)?: A Little Help needed to walk in hospital room?: A Little Help needed climbing 3-5 steps with a railing? : A Lot 6 Click Score: 17    End of Session Equipment Utilized During Treatment: Gait belt Activity Tolerance: Patient tolerated  treatment well Patient left: in chair;with call bell/phone within reach Nurse Communication: Mobility status PT Visit Diagnosis: Other abnormalities of gait and mobility (R26.89);Difficulty in walking, not elsewhere classified (R26.2);Muscle weakness (generalized) (M62.81);History of falling (Z91.81)    Time: 6286-3817 PT Time Calculation (min) (ACUTE ONLY): 32 min   Charges:   PT Evaluation $PT Eval Moderate Complexity: 1 Mod PT Treatments $Therapeutic Activity: 8-22 mins        Lieutenant Diego PT, DPT 3:57 PM,05/02/19 [phone]

## 2019-05-02 NOTE — Progress Notes (Signed)
Pharmacy Electrolyte Monitoring Consult:  Pharmacy consulted to assist in monitoring and replacing electrolytes in this 73 y.o. female admitted on 04/27/2019 with Headache and Altered Mental Status   Labs:  Sodium (mmol/L)  Date Value  05/02/2019 129 (L)  11/05/2014 131 (L)   Potassium (mmol/L)  Date Value  05/02/2019 3.7  11/05/2014 4.0   Magnesium (mg/dL)  Date Value  05/02/2019 2.1  02/09/2014 2.2   Phosphorus (mg/dL)  Date Value  05/02/2019 3.6   Calcium (mg/dL)  Date Value  05/02/2019 8.6 (L)   Calcium, Total (mg/dL)  Date Value  11/05/2014 8.9   Albumin (g/dL)  Date Value  04/27/2019 4.0  11/05/2014 3.6    Assessment/Plan: Sodium tablets 1 tab TIDWM started 7/1. Hyponatremia being managed by nephrology.   Potassium 68mEq PO x 1.   Will obtain electrolytes with am labs.   Pharmacy will continue to monitor and adjust per consult.   Koleman Marling L 05/02/2019 4:12 PM

## 2019-05-02 NOTE — Progress Notes (Signed)
[facility]  ROUNDING NOTE   Subjective:  Patient doing much better. Sodium stable at 129. We discussed fluid restriction today.  Objective:  Vital signs in last 24 hours:  Temp:  [98 F (36.7 C)-98.6 F (37 C)] 98.6 F (37 C) (07/03 0734) Pulse Rate:  [69-76] 72 (07/03 0400) Resp:  [14-18] 17 (07/03 0400) BP: (118-151)/(67-96) 151/85 (07/03 0400) SpO2:  [90 %-96 %] 90 % (07/03 0400) Weight:  [91.5 kg] 91.5 kg (07/03 0343)  Weight change: -1.8 kg Filed Weights   04/30/19 0500 05/01/19 0346 05/02/19 0343  Weight: 93.9 kg 93.3 kg 91.5 kg    Intake/Output: I/O last 3 completed shifts: In: 360 [P.O.:360] Out: 2525 [Urine:2525]   Intake/Output this shift:  Total I/O In: -  Out: 2100 [Urine:2100]  Physical Exam: General: No acute distress  Head: Normocephalic, atraumatic. Moist oral mucosal membranes  Eyes: Anicteric  Neck: Supple, trachea midline  Lungs:  Bilateral wheezing  Heart: S1S2 no rubs  Abdomen:  Soft, nontender, bowel sounds present  Extremities: Trace peripheral edema.  Neurologic:  Awake, alert, conversant.  Skin: No lesions       Basic Metabolic Panel: Recent Labs  Lab 04/28/19 0337  04/29/19 0423  04/30/19 0000 04/30/19 0531 04/30/19 1301  05/01/19 0342  05/01/19 1600 05/01/19 1933 05/01/19 2344 05/02/19 0344 05/02/19 0853  NA 119*   < > 120*   < >  --  127* 127*   < > 127*  127*   < > 125* 128* 126* 129*  128* 129*  K 3.2*  --  3.2*  --   --  3.4*  --   --  4.0  --   --   --   --  3.7  --   CL 84*  --  87*  --   --  91*  --   --  92*  --   --   --   --  92*  --   CO2 26  --  26  --   --  25  --   --  29  --   --   --   --  30  --   GLUCOSE 98  --  88  --   --  82  --   --  89  --   --   --   --  86  --   BUN 6*  --  5*  --   --  7*  --   --  9  --   --   --   --  10  --   CREATININE 0.39*  --  0.34*  --   --  0.51  --   --  0.50  --   --   --   --  0.62  --   CALCIUM 8.4*  --  8.3*  --   --  8.5*  --   --  8.6*  --   --    --   --  8.6*  --   MG 1.9  --  1.7  --  2.5*  --   --   --  2.1  --   --   --   --  2.1  --   PHOS  --   --   --   --   --   --  2.2*  --   --   --   --   --   --  3.6  --    < > = values in this interval not displayed.    Liver Function Tests: Recent Labs  Lab 04/27/19 0340  AST 24  ALT 18  ALKPHOS 78  BILITOT 0.6  PROT 7.3  ALBUMIN 4.0   No results for input(s): LIPASE, AMYLASE in the last 168 hours. No results for input(s): AMMONIA in the last 168 hours.  CBC: Recent Labs  Lab 04/28/19 0337 04/29/19 0423 04/30/19 0531 05/01/19 0342 05/02/19 0344  WBC 9.1 8.5 9.9 13.3* 11.1*  HGB 12.0 11.1* 11.8* 11.7* 11.1*  HCT 34.9* 32.9* 35.7* 35.6* 33.8*  MCV 81.4 82.7 84.4 84.8 85.1  PLT 293 284 334 314 295    Cardiac Enzymes: No results for input(s): CKTOTAL, CKMB, CKMBINDEX, TROPONINI in the last 168 hours.  BNP: Invalid input(s): POCBNP  CBG: Recent Labs  Lab 04/29/19 0821  GLUCAP 76    Microbiology: Results for orders placed or performed during the hospital encounter of 04/27/19  SARS Coronavirus 2 (CEPHEID - Performed in [facility] lab), Hosp Order     Status: None   Collection Time: 04/27/19  3:19 AM   Specimen: Nasopharyngeal Swab  Result Value Ref Range Status   SARS Coronavirus 2 NEGATIVE NEGATIVE Final    Comment: (NOTE) If result is NEGATIVE SARS-CoV-2 target nucleic acids are NOT DETECTED. The SARS-CoV-2 RNA is generally detectable in upper and lower  respiratory specimens during the acute phase of infection. The lowest  concentration of SARS-CoV-2 viral copies this assay can detect is 250  copies / mL. A negative result does not preclude SARS-CoV-2 infection  and should not be used as the sole basis for treatment or other  patient management decisions.  A negative result may occur with  improper specimen collection / handling, submission of specimen other  than nasopharyngeal swab, presence of viral mutation(s) within the  areas  targeted by this assay, and inadequate number of viral copies  (<250 copies / mL). A negative result must be combined with clinical  observations, patient history, and epidemiological information. If result is POSITIVE SARS-CoV-2 target nucleic acids are DETECTED. The SARS-CoV-2 RNA is generally detectable in upper and lower  respiratory specimens dur ing the acute phase of infection.  Positive  results are indicative of active infection with SARS-CoV-2.  Clinical  correlation with patient history and other diagnostic information is  necessary to determine patient infection status.  Positive results do  not rule out bacterial infection or co-infection with other viruses. If result is PRESUMPTIVE POSTIVE SARS-CoV-2 nucleic acids MAY BE PRESENT.   A presumptive positive result was obtained on the submitted specimen  and confirmed on repeat testing.  While 2019 novel coronavirus  (SARS-CoV-2) nucleic acids may be present in the submitted sample  additional confirmatory testing may be necessary for epidemiological  and / or clinical management purposes  to differentiate between  SARS-CoV-2 and other Sarbecovirus currently known to infect humans.  If clinically indicated additional testing with an alternate test  methodology [phone]) is advised. The SARS-CoV-2 RNA is generally  detectable in upper and lower respiratory sp ecimens during the acute  phase of infection. The expected result is Negative. Fact Sheet for Patients:  StrictlyIdeas.no Fact Sheet for Healthcare Providers: BankingDealers.co.za This test is not yet approved or cleared by the [place] FDA and has been authorized for detection and/or diagnosis of SARS-CoV-2 by FDA under an Emergency Use Authorization (EUA).  This EUA will remain in effect (meaning this test can  be used) for the duration of the COVID-19 declaration under Section 564(b)(1) of the Act, 21 U.S.C. section  360bbb-3(b)(1), unless the authorization is terminated or revoked sooner. Performed at [facility], [address] [place], [place] [address]   MRSA PCR Screening     Status: None   Collection Time: 04/29/19 11:13 AM   Specimen: Nasal Mucosa; Nasopharyngeal  Result Value Ref Range Status   MRSA by PCR NEGATIVE NEGATIVE Final    Comment:        The GeneXpert MRSA Assay (FDA approved for NASAL specimens only), is one component of a comprehensive MRSA colonization surveillance program. It is not intended to diagnose MRSA infection nor to guide or monitor treatment for MRSA infections. Performed at [facility], [address] [place], [place] [address]     Coagulation Studies: No results for input(s): LABPROT, INR in the last 72 hours.  Urinalysis: No results for input(s): COLORURINE, LABSPEC, PHURINE, GLUCOSEU, HGBUR, BILIRUBINUR, KETONESUR, PROTEINUR, UROBILINOGEN, NITRITE, LEUKOCYTESUR in the last 72 hours.  Invalid input(s): APPERANCEUR    Imaging: No results found.   Medications:    . amLODipine  5 mg Oral Daily  . cholecalciferol  2,000 Units Oral Daily  . citalopram  10 mg Oral Daily  . docusate sodium  100 mg Oral BID  . enoxaparin (LOVENOX) injection  40 mg Subcutaneous Q12H  . furosemide  20 mg Oral Daily  . gabapentin  200 mg Oral QHS  . levothyroxine  112 mcg Oral QAC breakfast  . metoprolol tartrate  100 mg Oral BID  . multivitamin with minerals  1 tablet Oral Daily  . pantoprazole  40 mg Oral Daily  . predniSONE  40 mg Oral Q breakfast  . sodium chloride flush  10-40 mL Intracatheter Q12H  . sodium chloride  1 g Oral TID WC  . tiotropium  18 mcg Inhalation Daily   acetaminophen **OR** acetaminophen, albuterol, hydrALAZINE, ondansetron, sodium chloride flush  Assessment/ Plan:  73 y.o. female  with a PMHx of COPD, depression, scoliosis, restless leg syndrome, GERD, degenertive disc disease, hypertension, osteoporosis, who was  admitted to [facility] on 04/27/2019 for evaluation of altered mental status.   1.  Hyponatremia. 2.  Altered mental status. 3.  COPD.    Plan: Serum sodium 129 at the moment.  Continue a combination of sodium chloride 1 g p.o. 3 times daily, Lasix 20 mg daily, and fluid restriction of 1.2 L/day.  This was discussed in depth with the patient as well as nursing.  Patient verbalized understanding.  She will need follow-up for her hyponatremia as an outpatient in the office.  Further plan per hospitalist.  LOS: 5 Jonita Hirota 7/3/20201:42 PM

## 2019-05-02 NOTE — Progress Notes (Signed)
[facility] [place] NAME: [name]    MR#:  [id number]  DATE OF BIRTH:  01-05-1946  SUBJECTIVE:  CHIEF COMPLAINT:   Chief Complaint  Patient presents with  . Headache  . Altered Mental Status   No new complaint this morning.  Patient awake and alert this morning.   Patient awaiting bed for transfer out of ICU  REVIEW OF SYSTEMS:  Review of Systems  Constitutional: Negative for chills and fever.  HENT: Negative for hearing loss and tinnitus.   Eyes: Negative for blurred vision and double vision.  Respiratory: Negative for cough and shortness of breath.   Cardiovascular: Negative for chest pain and palpitations.  Gastrointestinal: Negative for heartburn, nausea and vomiting.  Genitourinary: Negative for dysuria and urgency.  Musculoskeletal: Negative for myalgias and neck pain.  Skin: Negative for itching and rash.  Neurological: Negative for dizziness and headaches.  Psychiatric/Behavioral: Negative for depression.       Confusion appears improved    DRUG ALLERGIES:   Allergies  Allergen Reactions  . Adhesive [Tape] Other (See Comments)  . Other Rash    Plastic tape   VITALS:  Blood pressure (!) 151/85, pulse 72, temperature 98.6 F (37 C), temperature source Oral, resp. rate 17, height 4\' 10"  (1.473 m), weight 91.5 kg, SpO2 90 %. PHYSICAL EXAMINATION:   Physical Exam  Constitutional: She is oriented to person, place, and time. She appears well-developed and well-nourished.  HENT:  Head: Normocephalic and atraumatic.  Right Ear: External ear normal.  Eyes: Pupils are equal, round, and reactive to light. Conjunctivae are normal. Right eye exhibits no discharge.  Neck: Normal range of motion. Neck supple. No thyromegaly present.  Cardiovascular: Normal rate, regular rhythm and normal heart sounds.  Respiratory: Effort normal and breath sounds normal. She has no wheezes.  GI: Soft. Bowel sounds are normal. She  exhibits no distension.  Musculoskeletal: Normal range of motion.        General: No edema.  Neurological: She is alert and oriented to person, place, and time.  Moving all extremities with no focal deficit  Skin: Skin is warm. She is not diaphoretic. No erythema.  Psychiatric: She has a normal mood and affect. Her behavior is normal.   LABORATORY PANEL:  Female CBC Recent Labs  Lab 05/02/19 0344  WBC 11.1*  HGB 11.1*  HCT 33.8*  PLT 295   ------------------------------------------------------------------------------------------------------------------ Chemistries  Recent Labs  Lab 04/27/19 0340  05/02/19 0344 05/02/19 0853  NA 117*   < > 129*  128* 129*  K 3.8   < > 3.7  --   CL 83*   < > 92*  --   CO2 23   < > 30  --   GLUCOSE 104*   < > 86  --   BUN 8   < > 10  --   CREATININE 0.39*   < > 0.62  --   CALCIUM 8.8*   < > 8.6*  --   MG  --    < > 2.1  --   AST 24  --   --   --   ALT 18  --   --   --   ALKPHOS 78  --   --   --   BILITOT 0.6  --   --   --    < > = values in this interval not displayed.   RADIOLOGY:  No results found. ASSESSMENT AND  PLAN:   1.  Hyponatremia: No seizure. Reported to have intermittent confusion and hallucinations Sodium level was 117 on presentation.  Evaluated by nephrologist.  Work-up so far suggestive of underlying SIADH.   Patient started on 3% saline previously.  3% saline discontinued previously due to improvement in sodium level .sodium level of 129 today. CT chest to rule out underlying lung mass done revealed small focal inflammatory changes in the left lung apex but no evidence of malignancy in the lungs. Serum osmolality of 245.  Urine osmolality of 335.  Urine sodium of 98.  TSH level normal.  Cortisol level of 37.2. CT scan of the head without acute findings.  Clinically no evidence of infectious process found. Plan is to continue sodium chloride tablets 1 g p.o. 3 times daily.  Lasix 20 mg p.o. daily.  1.2 L daily fluid  restriction.  Appreciate nephrology input.  2.  Acute metabolic encephalopathy; Likely related to hyponatremia.  Patient was having intermittent hallucinations.  Hallucinations Patient was seen by psychiatry service due to hallucinations.  Notified by nursing staff.IVC was initiated on admission due to hallucinations as well. Psychiatric service gradually tapering off Celexa and gabapentin which can cause hyponatremia.  Appreciate psychiatry input.  Decreased gabapentin to 200 mg p.o. nightly.  This dose can be continued until follow-up with psychiatrist.  Decreased Celexa to 10 mg p.o. daily.  To continue on this dose.  I updated patient's husband and granddaughter; Christman recently who lives with the patient at home and she confirmed she took out the IVC papers because over the last 3 months patient has been progressively getting more confused and having recurrent falls at home.  Family having difficulty with caring for patient at home as well. Patient would likely benefit from skilled nursing facility placement on discharge  3.  Hypertension:.  Continue metoprolol and amlodipine.  Placed on PRN IV hydralazine with parameters  4.  Hypothyroidism: TSH level normal.  continue Synthroid  5.  Falls: Fall precautions. CT of the head with no acute findings. PT and OT evaluation requested Patient with most likely benefit from skilled nursing facility placement on discharge from the hospital.    DVT prophylaxis: Lovenox  All the records are reviewed and case discussed with Care Management/Social Worker. Management plans discussed with the patient, family and they are in agreement.  CODE STATUS: Full Code  TOTAL TIME TAKING CARE OF THIS PATIENT: 34 minutes.   More than 50% of the time was spent in counseling/coordination of care: YES  POSSIBLE D/C IN 2 DAYS, DEPENDING ON CLINICAL CONDITION.   Jaycob Mcclenton M.D on 05/02/2019 at 2:47 PM  Between 7am to 6pm - Pager - [phone]  After 6pm go  to [email or web address] - Proofreader  [facility]  Office  [phone]  CC: Primary care physician; Baxter Hire, MD  Note: This dictation was prepared with Dragon dictation along with smaller phrase technology. Any transcriptional errors that result from this process are unintentional.

## 2019-05-03 LAB — BASIC METABOLIC PANEL
Anion gap: 8 (ref 5–15)
BUN: 14 mg/dL (ref 8–23)
CO2: 32 mmol/L (ref 22–32)
Calcium: 9 mg/dL (ref 8.9–10.3)
Chloride: 91 mmol/L — ABNORMAL LOW (ref 98–111)
Creatinine, Ser: 0.62 mg/dL (ref 0.44–1.00)
GFR calc Af Amer: >60 mL/min (ref >60)
GFR calc non Af Amer: >60 mL/min (ref >60)
Glucose, Bld: 84 mg/dL (ref 70–99)
Potassium: 4 mmol/L (ref 3.5–5.1)
Sodium: 131 mmol/L — ABNORMAL LOW (ref 135–145)

## 2019-05-03 LAB — SODIUM
Sodium: 133 mmol/L — ABNORMAL LOW (ref 135–145)
Sodium: 131 mmol/L — ABNORMAL LOW (ref 135–145)

## 2019-05-03 MED ORDER — ENOXAPARIN SODIUM 40 MG/0.4ML ~~LOC~~ SOLN
40.0000 mg | SUBCUTANEOUS | Status: DC
  Administered 2019-05-04 – 2019-05-05 (×2): 40 mg via SUBCUTANEOUS
  Filled 2019-05-03 (×2): qty 0.4

## 2019-05-03 NOTE — Plan of Care (Signed)
  Problem: Education: Goal: Knowledge of General Education information will improve Description: Including pain rating scale, medication(s)/side effects and non-pharmacologic comfort measures Outcome: Progressing   Problem: Health Behavior/Discharge Planning: Goal: Ability to manage health-related needs will improve Outcome: Progressing Note: Patient's sodium level has continued to rise, but today it's still low at 133. Will collect another sample between 5 - 7P to check new level. Will continue to monitor all lab values. Wenda Low [facility]

## 2019-05-03 NOTE — Plan of Care (Signed)
  Problem: Clinical Measurements: Goal: Ability to maintain clinical measurements within normal limits will improve Outcome: Progressing Goal: Will remain free from infection Outcome: Progressing Goal: Cardiovascular complication will be avoided Outcome: Progressing   Problem: Coping: Goal: Level of anxiety will decrease Outcome: Progressing

## 2019-05-03 NOTE — Progress Notes (Signed)
[facility]  ROUNDING NOTE   Subjective:  Serum sodium up to 131. Doing much better as compared to admission.  Objective:  Vital signs in last 24 hours:  Temp:  [97.8 F (36.6 C)-98.6 F (37 C)] 97.8 F (36.6 C) (07/04 0720) Pulse Rate:  [64-68] 64 (07/04 0720) Resp:  [16-18] 16 (07/04 0720) BP: (129-151)/(79-89) 140/79 (07/04 0720) SpO2:  [91 %-95 %] 94 % (07/04 0720) Weight:  [91.4 kg-91.8 kg] 91.4 kg (07/04 0445)  Weight change: 0.308 kg Filed Weights   05/02/19 0343 05/02/19 1900 05/03/19 0445  Weight: 91.5 kg 91.8 kg 91.4 kg    Intake/Output: I/O last 3 completed shifts: In: 120 [P.O.:120] Out: 3325 [Urine:3325]   Intake/Output this shift:  Total I/O In: 240 [P.O.:240] Out: -   Physical Exam: General: No acute distress  Head: Normocephalic, atraumatic. Moist oral mucosal membranes  Eyes: Anicteric  Neck: Supple, trachea midline  Lungs:  Bilateral wheezing  Heart: S1S2 no rubs  Abdomen:  Soft, nontender, bowel sounds present  Extremities: Trace peripheral edema.  Neurologic: Awake, alert, conversant.  Skin: No lesions       Basic Metabolic Panel: Recent Labs  Lab 04/28/19 0337  04/29/19 0423  04/30/19 0000 04/30/19 0531 04/30/19 1301  05/01/19 0342  05/02/19 0344 05/02/19 0853 05/02/19 1640 05/03/19 0525 05/03/19 0800  NA 119*   < > 120*   < >  --  127* 127*   < > 127*  127*   < > 129*  128* 129* 129* 131* 133*  K 3.2*  --  3.2*  --   --  3.4*  --   --  4.0  --  3.7  --   --  4.0  --   CL 84*  --  87*  --   --  91*  --   --  92*  --  92*  --   --  91*  --   CO2 26  --  26  --   --  25  --   --  29  --  30  --   --  32  --   GLUCOSE 98  --  88  --   --  82  --   --  89  --  86  --   --  84  --   BUN 6*  --  5*  --   --  7*  --   --  9  --  10  --   --  14  --   CREATININE 0.39*  --  0.34*  --   --  0.51  --   --  0.50  --  0.62  --   --  0.62  --   CALCIUM 8.4*  --  8.3*  --   --  8.5*  --   --  8.6*  --  8.6*  --   --  9.0  --    MG 1.9  --  1.7  --  2.5*  --   --   --  2.1  --  2.1  --   --   --   --   PHOS  --   --   --   --   --   --  2.2*  --   --   --  3.6  --   --   --   --    < > = values in this interval not  displayed.    Liver Function Tests: Recent Labs  Lab 04/27/19 0340  AST 24  ALT 18  ALKPHOS 78  BILITOT 0.6  PROT 7.3  ALBUMIN 4.0   No results for input(s): LIPASE, AMYLASE in the last 168 hours. No results for input(s): AMMONIA in the last 168 hours.  CBC: Recent Labs  Lab 04/28/19 0337 04/29/19 0423 04/30/19 0531 05/01/19 0342 05/02/19 0344  WBC 9.1 8.5 9.9 13.3* 11.1*  HGB 12.0 11.1* 11.8* 11.7* 11.1*  HCT 34.9* 32.9* 35.7* 35.6* 33.8*  MCV 81.4 82.7 84.4 84.8 85.1  PLT 293 284 334 314 295    Cardiac Enzymes: No results for input(s): CKTOTAL, CKMB, CKMBINDEX, TROPONINI in the last 168 hours.  BNP: Invalid input(s): POCBNP  CBG: Recent Labs  Lab 04/29/19 0821  GLUCAP 76    Microbiology: Results for orders placed or performed during the hospital encounter of 04/27/19  SARS Coronavirus 2 (CEPHEID - Performed in [place] hospital lab), Hosp Order     Status: None   Collection Time: 04/27/19  3:19 AM   Specimen: Nasopharyngeal Swab  Result Value Ref Range Status   SARS Coronavirus 2 NEGATIVE NEGATIVE Final    Comment: (NOTE) If result is NEGATIVE SARS-CoV-2 target nucleic acids are NOT DETECTED. The SARS-CoV-2 RNA is generally detectable in upper and lower  respiratory specimens during the acute phase of infection. The lowest  concentration of SARS-CoV-2 viral copies this assay can detect is 250  copies / mL. A negative result does not preclude SARS-CoV-2 infection  and should not be used as the sole basis for treatment or other  patient management decisions.  A negative result may occur with  improper specimen collection / handling, submission of specimen other  than nasopharyngeal swab, presence of viral mutation(s) within the  areas targeted by this assay,  and inadequate number of viral copies  (<250 copies / mL). A negative result must be combined with clinical  observations, patient history, and epidemiological information. If result is POSITIVE SARS-CoV-2 target nucleic acids are DETECTED. The SARS-CoV-2 RNA is generally detectable in upper and lower  respiratory specimens dur ing the acute phase of infection.  Positive  results are indicative of active infection with SARS-CoV-2.  Clinical  correlation with patient history and other diagnostic information is  necessary to determine patient infection status.  Positive results do  not rule out bacterial infection or co-infection with other viruses. If result is PRESUMPTIVE POSTIVE SARS-CoV-2 nucleic acids MAY BE PRESENT.   A presumptive positive result was obtained on the submitted specimen  and confirmed on repeat testing.  While 2019 novel coronavirus  (SARS-CoV-2) nucleic acids may be present in the submitted sample  additional confirmatory testing may be necessary for epidemiological  and / or clinical management purposes  to differentiate between  SARS-CoV-2 and other Sarbecovirus currently known to infect humans.  If clinically indicated additional testing with an alternate test  methodology [phone]) is advised. The SARS-CoV-2 RNA is generally  detectable in upper and lower respiratory sp ecimens during the acute  phase of infection. The expected result is Negative. Fact Sheet for Patients:  StrictlyIdeas.no Fact Sheet for Healthcare Providers: BankingDealers.co.za This test is not yet approved or cleared by the [place] FDA and has been authorized for detection and/or diagnosis of SARS-CoV-2 by FDA under an Emergency Use Authorization (EUA).  This EUA will remain in effect (meaning this test can be used) for the duration of the COVID-19 declaration under Section 564(b)(1) of the  Act, 21 U.S.C. section 360bbb-3(b)(1), unless  the authorization is terminated or revoked sooner. Performed at [facility], [address] [place], [place] [address]   MRSA PCR Screening     Status: None   Collection Time: 04/29/19 11:13 AM   Specimen: Nasal Mucosa; Nasopharyngeal  Result Value Ref Range Status   MRSA by PCR NEGATIVE NEGATIVE Final    Comment:        The GeneXpert MRSA Assay (FDA approved for NASAL specimens only), is one component of a comprehensive MRSA colonization surveillance program. It is not intended to diagnose MRSA infection nor to guide or monitor treatment for MRSA infections. Performed at [facility], [address] [place], [place] [address]     Coagulation Studies: No results for input(s): LABPROT, INR in the last 72 hours.  Urinalysis: No results for input(s): COLORURINE, LABSPEC, PHURINE, GLUCOSEU, HGBUR, BILIRUBINUR, KETONESUR, PROTEINUR, UROBILINOGEN, NITRITE, LEUKOCYTESUR in the last 72 hours.  Invalid input(s): APPERANCEUR    Imaging: No results found.   Medications:    . amLODipine  5 mg Oral Daily  . cholecalciferol  2,000 Units Oral Daily  . citalopram  10 mg Oral Daily  . docusate sodium  100 mg Oral BID  . [START ON 05/04/2019] enoxaparin (LOVENOX) injection  40 mg Subcutaneous Q24H  . furosemide  20 mg Oral Daily  . gabapentin  200 mg Oral QHS  . levothyroxine  112 mcg Oral QAC breakfast  . metoprolol tartrate  100 mg Oral BID  . multivitamin with minerals  1 tablet Oral Daily  . pantoprazole  40 mg Oral Daily  . predniSONE  40 mg Oral Q breakfast  . sodium chloride flush  10-40 mL Intracatheter Q12H  . sodium chloride  1 g Oral TID WC  . tiotropium  18 mcg Inhalation Daily   acetaminophen **OR** acetaminophen, albuterol, hydrALAZINE, ondansetron, sodium chloride flush  Assessment/ Plan:  73 y.o. female  with a PMHx of COPD, depression, scoliosis, restless leg syndrome, GERD, degenertive disc disease, hypertension, osteoporosis, who was  admitted to [facility] on 04/27/2019 for evaluation of altered mental status.   1.  Hyponatremia. 2.  Altered mental status. 3.  COPD.    Plan: Serum sodium continues to rise.  Continue salt tablets 1 g p.o. 3 times daily, furosemide 20 mg daily, and fluid restriction of 1.2 L/day.  Patient verbalized understanding of the plan for hyponatremia.  She will need follow-up in our office in 1 to 2 weeks post discharge.  LOS: 6 [name] 7/4/20203:18 PM

## 2019-05-03 NOTE — Progress Notes (Signed)
Pharmacy Electrolyte Monitoring Consult:  Pharmacy consulted to assist in monitoring and replacing electrolytes in this 73 y.o. female admitted on 04/27/2019 with Headache and Altered Mental Status   Labs:  Sodium (mmol/L)  Date Value  05/03/2019 131 (L)  11/05/2014 131 (L)   Potassium (mmol/L)  Date Value  05/03/2019 4.0  11/05/2014 4.0   Magnesium (mg/dL)  Date Value  05/02/2019 2.1  02/09/2014 2.2   Phosphorus (mg/dL)  Date Value  05/02/2019 3.6   Calcium (mg/dL)  Date Value  05/03/2019 9.0   Calcium, Total (mg/dL)  Date Value  11/05/2014 8.9   Albumin (g/dL)  Date Value  04/27/2019 4.0  11/05/2014 3.6    Assessment/Plan: Sodium tablets 1 tab TIDWM started 7/1. Hyponatremia being managed by nephrology.   Other electrolytes wnl   Because this consult was initiated in CCU pharmacy will sign off for now  Dallie Piles, PharmD 05/03/2019 7:11 AM

## 2019-05-03 NOTE — Progress Notes (Signed)
Discussed with patient last night and again this AM regarding removal of both her Foley catheter and her PICC line, and how I couldn't see any remaining justification as to keeping them in. She stated that instead there was no justification as to taking them out prior to her going home. I educated her the best I could regarding increased infection risk. It didn't change her mind in the least. Will pass along in shift report. Wenda Low [facility]

## 2019-05-03 NOTE — Progress Notes (Signed)
[facility] [place] NAME: Jeannia Tatro    MR#:  [id number]  DATE OF BIRTH:  [birth year]  SUBJECTIVE:  CHIEF COMPLAINT:   Chief Complaint  Patient presents with  . Headache  . Altered Mental Status   No new complaint this morning.  Patient awake and alert this morning.   REVIEW OF SYSTEMS:  Review of Systems  Constitutional: Negative for chills and fever.  HENT: Negative for hearing loss and tinnitus.   Eyes: Negative for blurred vision and double vision.  Respiratory: Negative for cough and shortness of breath.   Cardiovascular: Negative for chest pain and palpitations.  Gastrointestinal: Negative for heartburn, nausea and vomiting.  Genitourinary: Negative for dysuria and urgency.  Musculoskeletal: Negative for myalgias and neck pain.  Skin: Negative for itching and rash.  Neurological: Negative for dizziness and headaches.  Psychiatric/Behavioral: Negative for depression.       Confusion appears improved    DRUG ALLERGIES:   Allergies  Allergen Reactions  . Adhesive [Tape] Other (See Comments)  . Other Rash    Plastic tape   VITALS:  Blood pressure 140/79, pulse 64, temperature 97.8 F (36.6 C), temperature source Oral, resp. rate 16, height 4\' 10"  (1.473 m), weight 91.4 kg, SpO2 94 %. PHYSICAL EXAMINATION:   Physical Exam  Constitutional: She is oriented to person, place, and time. She appears well-developed and well-nourished.  HENT:  Head: Normocephalic and atraumatic.  Right Ear: External ear normal.  Eyes: Pupils are equal, round, and reactive to light. Conjunctivae are normal. Right eye exhibits no discharge.  Neck: Normal range of motion. Neck supple. No thyromegaly present.  Cardiovascular: Normal rate, regular rhythm and normal heart sounds.  Respiratory: Effort normal and breath sounds normal. She has no wheezes.  GI: Soft. Bowel sounds are normal. She exhibits no distension.  Musculoskeletal: Normal range  of motion.        General: No edema.  Neurological: She is alert and oriented to person, place, and time.  Moving all extremities with no focal deficit  Skin: Skin is warm. She is not diaphoretic. No erythema.  Psychiatric: She has a normal mood and affect. Her behavior is normal.   LABORATORY PANEL:  Female CBC Recent Labs  Lab 05/02/19 0344  WBC 11.1*  HGB 11.1*  HCT 33.8*  PLT 295   ------------------------------------------------------------------------------------------------------------------ Chemistries  Recent Labs  Lab 04/27/19 0340  05/02/19 0344  05/03/19 0525 05/03/19 0800  NA 117*   < > 129*  128*   < > 131* 133*  K 3.8   < > 3.7  --  4.0  --   CL 83*   < > 92*  --  91*  --   CO2 23   < > 30  --  32  --   GLUCOSE 104*   < > 86  --  84  --   BUN 8   < > 10  --  14  --   CREATININE 0.39*   < > 0.62  --  0.62  --   CALCIUM 8.8*   < > 8.6*  --  9.0  --   MG  --    < > 2.1  --   --   --   AST 24  --   --   --   --   --   ALT 18  --   --   --   --   --  ALKPHOS 78  --   --   --   --   --   BILITOT 0.6  --   --   --   --   --    < > = values in this interval not displayed.   RADIOLOGY:  No results found. ASSESSMENT AND PLAN:   1.  Hyponatremia: No seizure. Reported to have intermittent confusion and hallucinations Sodium level was 117 on presentation.  Evaluated by nephrologist.  Work-up so far suggestive of underlying SIADH.   Patient started on 3% saline previously.  3% saline discontinued previously due to improvement in sodium level .sodium level of 133 today. CT chest to rule out underlying lung mass done revealed small focal inflammatory changes in the left lung apex but no evidence of malignancy in the lungs. Serum osmolality of 245.  Urine osmolality of 335.  Urine sodium of 98.  TSH level normal.  Cortisol level of 37.2. CT scan of the head without acute findings.  Clinically no evidence of infectious process found. Plan is to continue sodium  chloride tablets 1 g p.o. 3 times daily.  Lasix 20 mg p.o. daily.  1.2 L daily fluid restriction.  Appreciate nephrology input.  2.  Acute metabolic encephalopathy; significantly improved clinically. Likely related to hyponatremia.  Patient was having intermittent hallucinations.  Hallucinations Patient was seen by psychiatry service due to hallucinations.  Notified by nursing staff.IVC was initiated on admission due to hallucinations as well. Psychiatric service gradually tapering off Celexa and gabapentin which can cause hyponatremia.  Appreciate psychiatry input.  Decreased gabapentin to 200 mg p.o. nightly.  This dose can be continued until follow-up with psychiatrist.  Decreased Celexa to 10 mg p.o. daily.  To continue on this dose.  I updated patient's husband and granddaughter; Reimann recently who lives with the patient at home and she confirmed she took out the IVC papers because over the last 3 months patient has been progressively getting more confused and having recurrent falls at home.  Family having difficulty with caring for patient at home as well.  Patient cleared by psych and IVC already rescinded.  No indication for admission to inpatient psychiatric unit.  3.  Hypertension:.  Continue metoprolol and amlodipine.  Placed on PRN IV hydralazine with parameters  4.  Hypothyroidism: TSH level normal.  continue Synthroid  5.  Falls: Fall precautions. CT of the head with no acute findings. Patient seen by physical therapist.  Recommended skilled nursing facility placement.  Case manager to work on placement after the weekend    DVT prophylaxis: Lovenox  All the records are reviewed and case discussed with Care Management/Social Worker. Management plans discussed with the patient, family and they are in agreement. I called and updated patient's husband again this morning on treatment and plans for skilled nursing facility placement after the weekend.  CODE STATUS: Full Code   TOTAL TIME TAKING CARE OF THIS PATIENT: 36 minutes.   More than 50% of the time was spent in counseling/coordination of care: YES  POSSIBLE D/C IN 2 DAYS, DEPENDING ON CLINICAL CONDITION.   Wake Conlee M.D on 05/03/2019 at 10:47 AM  Between 7am to 6pm - Pager - [phone]  After 6pm go to [email or web address] - Proofreader  [facility]  Office  [phone]  CC: Primary care physician; Baxter Hire, MD  Note: This dictation was prepared with Dragon dictation along with smaller phrase technology. Any transcriptional errors that result from this process are unintentional.

## 2019-05-04 LAB — BASIC METABOLIC PANEL
Anion gap: 8 (ref 5–15)
BUN: 21 mg/dL (ref 8–23)
CO2: 33 mmol/L — ABNORMAL HIGH (ref 22–32)
Calcium: 9.1 mg/dL (ref 8.9–10.3)
Chloride: 91 mmol/L — ABNORMAL LOW (ref 98–111)
Creatinine, Ser: 0.58 mg/dL (ref 0.44–1.00)
GFR calc Af Amer: >60 mL/min (ref >60)
GFR calc non Af Amer: >60 mL/min (ref >60)
Glucose, Bld: 84 mg/dL (ref 70–99)
Potassium: 4 mmol/L (ref 3.5–5.1)
Sodium: 132 mmol/L — ABNORMAL LOW (ref 135–145)

## 2019-05-04 LAB — SODIUM: Sodium: 131 mmol/L — ABNORMAL LOW (ref 135–145)

## 2019-05-04 MED ORDER — POLYETHYLENE GLYCOL 3350 17 G PO PACK
17.0000 g | PACK | Freq: Every day | ORAL | Status: DC | PRN
  Administered 2019-05-04: 17 g via ORAL
  Filled 2019-05-04: qty 1

## 2019-05-04 MED ORDER — MORPHINE SULFATE (PF) 2 MG/ML IV SOLN
0.5000 mg | INTRAVENOUS | Status: DC | PRN
  Administered 2019-05-04: 1 mg via INTRAVENOUS
  Filled 2019-05-04: qty 1

## 2019-05-04 NOTE — Progress Notes (Signed)
[facility]  ROUNDING NOTE   Subjective:  Serum sodium remained stable at 131. States that she is attempting to follow 1.2 L fluid restriction.  Objective:  Vital signs in last 24 hours:  Temp:  [97.7 F (36.5 C)-98.7 F (37.1 C)] 98.7 F (37.1 C) (07/05 0714) Pulse Rate:  [61-70] 67 (07/05 0714) Resp:  [16-20] 16 (07/05 0714) BP: (115-144)/(68-85) 144/85 (07/05 0714) SpO2:  [91 %-93 %] 91 % (07/05 0714) Weight:  [91.7 kg] 91.7 kg (07/05 0349)  Weight change: -0.091 kg Filed Weights   05/02/19 1900 05/03/19 0445 05/04/19 0349  Weight: 91.8 kg 91.4 kg 91.7 kg    Intake/Output: I/O last 3 completed shifts: In: 240 [P.O.:240] Out: 1450 [Urine:1450]   Intake/Output this shift:  Total I/O In: 250 [P.O.:240; I.V.:10] Out: 900 [Urine:900]  Physical Exam: General: No acute distress  Head: Normocephalic, atraumatic. Moist oral mucosal membranes  Eyes: Anicteric  Neck: Supple, trachea midline  Lungs:  Bilateral wheezing, normal effort  Heart: S1S2 no rubs  Abdomen:  Soft, nontender, bowel sounds present  Extremities: Trace peripheral edema.  Neurologic: Awake, alert, conversant.  Skin: No lesions       Basic Metabolic Panel: Recent Labs  Lab 04/28/19 0337  04/29/19 0423  04/30/19 0000 04/30/19 0531 04/30/19 1301  05/01/19 0342  05/02/19 0344  05/03/19 0525 05/03/19 0800 05/03/19 1718 05/04/19 0409 05/04/19 0949  NA 119*   < > 120*   < >  --  127* 127*   < > 127*  127*   < > 129*  128*   < > 131* 133* 131* 132* 131*  K 3.2*  --  3.2*  --   --  3.4*  --   --  4.0  --  3.7  --  4.0  --   --  4.0  --   CL 84*  --  87*  --   --  91*  --   --  92*  --  92*  --  91*  --   --  91*  --   CO2 26  --  26  --   --  25  --   --  29  --  30  --  32  --   --  33*  --   GLUCOSE 98  --  88  --   --  82  --   --  89  --  86  --  84  --   --  84  --   BUN 6*  --  5*  --   --  7*  --   --  9  --  10  --  14  --   --  21  --   CREATININE 0.39*  --  0.34*  --    --  0.51  --   --  0.50  --  0.62  --  0.62  --   --  0.58  --   CALCIUM 8.4*  --  8.3*  --   --  8.5*  --   --  8.6*  --  8.6*  --  9.0  --   --  9.1  --   MG 1.9  --  1.7  --  2.5*  --   --   --  2.1  --  2.1  --   --   --   --   --   --   PHOS  --   --   --   --   --   --  2.2*  --   --   --  3.6  --   --   --   --   --   --    < > = values in this interval not displayed.    Liver Function Tests: No results for input(s): AST, ALT, ALKPHOS, BILITOT, PROT, ALBUMIN in the last 168 hours. No results for input(s): LIPASE, AMYLASE in the last 168 hours. No results for input(s): AMMONIA in the last 168 hours.  CBC: Recent Labs  Lab 04/28/19 0337 04/29/19 0423 04/30/19 0531 05/01/19 0342 05/02/19 0344  WBC 9.1 8.5 9.9 13.3* 11.1*  HGB 12.0 11.1* 11.8* 11.7* 11.1*  HCT 34.9* 32.9* 35.7* 35.6* 33.8*  MCV 81.4 82.7 84.4 84.8 85.1  PLT 293 284 334 314 295    Cardiac Enzymes: No results for input(s): CKTOTAL, CKMB, CKMBINDEX, TROPONINI in the last 168 hours.  BNP: Invalid input(s): POCBNP  CBG: Recent Labs  Lab 04/29/19 0821  GLUCAP 76    Microbiology: Results for orders placed or performed during the hospital encounter of 04/27/19  SARS Coronavirus 2 (CEPHEID - Performed in [facility] hospital lab), Hosp Order     Status: None   Collection Time: 04/27/19  3:19 AM   Specimen: Nasopharyngeal Swab  Result Value Ref Range Status   SARS Coronavirus 2 NEGATIVE NEGATIVE Final    Comment: (NOTE) If result is NEGATIVE SARS-CoV-2 target nucleic acids are NOT DETECTED. The SARS-CoV-2 RNA is generally detectable in upper and lower  respiratory specimens during the acute phase of infection. The lowest  concentration of SARS-CoV-2 viral copies this assay can detect is 250  copies / mL. A negative result does not preclude SARS-CoV-2 infection  and should not be used as the sole basis for treatment or other  patient management decisions.  A negative result may occur with  improper  specimen collection / handling, submission of specimen other  than nasopharyngeal swab, presence of viral mutation(s) within the  areas targeted by this assay, and inadequate number of viral copies  (<250 copies / mL). A negative result must be combined with clinical  observations, patient history, and epidemiological information. If result is POSITIVE SARS-CoV-2 target nucleic acids are DETECTED. The SARS-CoV-2 RNA is generally detectable in upper and lower  respiratory specimens dur ing the acute phase of infection.  Positive  results are indicative of active infection with SARS-CoV-2.  Clinical  correlation with patient history and other diagnostic information is  necessary to determine patient infection status.  Positive results do  not rule out bacterial infection or co-infection with other viruses. If result is PRESUMPTIVE POSTIVE SARS-CoV-2 nucleic acids MAY BE PRESENT.   A presumptive positive result was obtained on the submitted specimen  and confirmed on repeat testing.  While 2019 novel coronavirus  (SARS-CoV-2) nucleic acids may be present in the submitted sample  additional confirmatory testing may be necessary for epidemiological  and / or clinical management purposes  to differentiate between  SARS-CoV-2 and other Sarbecovirus currently known to infect humans.  If clinically indicated additional testing with an alternate test  methodology [phone]) is advised. The SARS-CoV-2 RNA is generally  detectable in upper and lower respiratory sp ecimens during the acute  phase of infection. The expected result is Negative. Fact Sheet for Patients:  StrictlyIdeas.no Fact Sheet for Healthcare Providers: BankingDealers.co.za This test is not yet approved or cleared by the [place] FDA and has been authorized for detection and/or diagnosis of SARS-CoV-2 by FDA under an Emergency  Use Authorization (EUA).  This EUA will remain in  effect (meaning this test can be used) for the duration of the COVID-19 declaration under Section 564(b)(1) of the Act, 21 U.S.C. section 360bbb-3(b)(1), unless the authorization is terminated or revoked sooner. Performed at [facility], [address] [place], [place] [address]   MRSA PCR Screening     Status: None   Collection Time: 04/29/19 11:13 AM   Specimen: Nasal Mucosa; Nasopharyngeal  Result Value Ref Range Status   MRSA by PCR NEGATIVE NEGATIVE Final    Comment:        The GeneXpert MRSA Assay (FDA approved for NASAL specimens only), is one component of a comprehensive MRSA colonization surveillance program. It is not intended to diagnose MRSA infection nor to guide or monitor treatment for MRSA infections. Performed at [facility], [address] [place], [place] [address]     Coagulation Studies: No results for input(s): LABPROT, INR in the last 72 hours.  Urinalysis: No results for input(s): COLORURINE, LABSPEC, PHURINE, GLUCOSEU, HGBUR, BILIRUBINUR, KETONESUR, PROTEINUR, UROBILINOGEN, NITRITE, LEUKOCYTESUR in the last 72 hours.  Invalid input(s): APPERANCEUR    Imaging: No results found.   Medications:    . amLODipine  5 mg Oral Daily  . cholecalciferol  2,000 Units Oral Daily  . citalopram  10 mg Oral Daily  . docusate sodium  100 mg Oral BID  . enoxaparin (LOVENOX) injection  40 mg Subcutaneous Q24H  . furosemide  20 mg Oral Daily  . gabapentin  200 mg Oral QHS  . levothyroxine  112 mcg Oral QAC breakfast  . metoprolol tartrate  100 mg Oral BID  . multivitamin with minerals  1 tablet Oral Daily  . pantoprazole  40 mg Oral Daily  . predniSONE  40 mg Oral Q breakfast  . sodium chloride flush  10-40 mL Intracatheter Q12H  . sodium chloride  1 g Oral TID WC  . tiotropium  18 mcg Inhalation Daily   acetaminophen **OR** acetaminophen, albuterol, hydrALAZINE, morphine injection, ondansetron, polyethylene glycol, sodium  chloride flush  Assessment/ Plan:  73 y.o. female  with a PMHx of COPD, depression, scoliosis, restless leg syndrome, GERD, degenertive disc disease, hypertension, osteoporosis, who was admitted to [facility] on 04/27/2019 for evaluation of altered mental status.   1.  Hyponatremia. 2.  Altered mental status. 3.  COPD.    Plan: Sodium significantly improved as compared to admission.  Currently up to 131.  We will continue a combination of fluid restriction of 1.2 L/day, sodium chloride 1 g p.o. 3 times daily, as well as furosemide 20 mg daily.  Continue to monitor serum sodium as an outpatient.  CT chest was performed and was negative for lung mass.  Further plan as per hospitalist.  LOS: 7 [name] 7/5/20202:46 PM

## 2019-05-04 NOTE — Progress Notes (Signed)
[place] [place] NAME: [name]    MR#:  [id number]  DATE OF BIRTH:  1945/12/10  SUBJECTIVE:  CHIEF COMPLAINT:   Chief Complaint  Patient presents with  . Headache  . Altered Mental Status   No new complaint this morning.  Patient awake and alert this morning.   REVIEW OF SYSTEMS:  Review of Systems  Constitutional: Negative for chills and fever.  HENT: Negative for hearing loss and tinnitus.   Eyes: Negative for blurred vision and double vision.  Respiratory: Negative for cough and shortness of breath.   Cardiovascular: Negative for chest pain and palpitations.  Gastrointestinal: Negative for heartburn, nausea and vomiting.  Genitourinary: Negative for dysuria and urgency.  Musculoskeletal: Negative for myalgias and neck pain.  Skin: Negative for itching and rash.  Neurological: Negative for dizziness and headaches.  Psychiatric/Behavioral: Negative for depression.       Confusion appears improved    DRUG ALLERGIES:   Allergies  Allergen Reactions  . Adhesive [Tape] Other (See Comments)  . Other Rash    Plastic tape   VITALS:  Blood pressure (!) 144/85, pulse 67, temperature 98.7 F (37.1 C), temperature source Oral, resp. rate 16, height 4\' 10"  (1.473 m), weight 91.7 kg, SpO2 91 %. PHYSICAL EXAMINATION:   Physical Exam  Constitutional: She is oriented to person, place, and time. She appears well-developed and well-nourished.  HENT:  Head: Normocephalic and atraumatic.  Right Ear: External ear normal.  Eyes: Pupils are equal, round, and reactive to light. Conjunctivae are normal. Right eye exhibits no discharge.  Neck: Normal range of motion. Neck supple. No thyromegaly present.  Cardiovascular: Normal rate, regular rhythm and normal heart sounds.  Respiratory: Effort normal and breath sounds normal. She has no wheezes.  GI: Soft. Bowel sounds are normal. She exhibits no distension.  Musculoskeletal: Normal  range of motion.        General: No edema.  Neurological: She is alert and oriented to person, place, and time.  Moving all extremities with no focal deficit  Skin: Skin is warm. She is not diaphoretic. No erythema.  Psychiatric: She has a normal mood and affect. Her behavior is normal.   LABORATORY PANEL:  Female CBC Recent Labs  Lab 05/02/19 0344  WBC 11.1*  HGB 11.1*  HCT 33.8*  PLT 295   ------------------------------------------------------------------------------------------------------------------ Chemistries  Recent Labs  Lab 05/02/19 0344  05/04/19 0409 05/04/19 0949  NA 129*  128*   < > 132* 131*  K 3.7   < > 4.0  --   CL 92*   < > 91*  --   CO2 30   < > 33*  --   GLUCOSE 86   < > 84  --   BUN 10   < > 21  --   CREATININE 0.62   < > 0.58  --   CALCIUM 8.6*   < > 9.1  --   MG 2.1  --   --   --    < > = values in this interval not displayed.   RADIOLOGY:  No results found. ASSESSMENT AND PLAN:   1.  Hyponatremia: No seizure. Reported to have intermittent confusion and hallucinations Sodium level was 117 on presentation.  Evaluated by nephrologist.  Work-up so far suggestive of underlying SIADH. Serum osmolality of 245.  Urine osmolality of 335.  Urine sodium of 98.  TSH level normal.  Cortisol level of 37.2.  Patient  started on 3% saline previously.  3% saline discontinued previously due to improvement in sodium level .sodium level of 131 today. CT chest to rule out underlying lung mass done revealed small focal inflammatory changes in the left lung apex but no evidence of malignancy in the lungs. CT scan of the head without acute findings.  Clinically no evidence of infectious process found. Plan is to continue sodium chloride tablets 1 g p.o. 3 times daily.  Lasix 20 mg p.o. daily.  1.2 L daily fluid restriction.  Appreciate nephrology input.  2.  Acute metabolic encephalopathy; significantly improved clinically. Likely related to hyponatremia.  Patient was  having intermittent hallucinations.  Hallucinations Patient was seen by psychiatry service due to hallucinations.  Notified by nursing staff.IVC was initiated on admission due to hallucinations as well.  Patient cleared by psychiatrist.  No indication for inpatient psychiatric admission. Psychiatric service gradually tapering off Celexa and gabapentin which can cause hyponatremia.  Appreciate psychiatry input.  Decreased gabapentin to 200 mg p.o. nightly.  This dose can be continued until follow-up with psychiatrist.  Decreased Celexa to 10 mg p.o. daily.  To continue on this dose.  I updated patient's husband and granddaughter; [name] recently who lives with the patient at home and she confirmed she took out the IVC papers because over the last 3 months patient has been progressively getting more confused and having recurrent falls at home.  Family having difficulty with caring for patient at home as well.  Patient cleared by psych and IVC already rescinded.  No indication for admission to inpatient psychiatric unit.  3.  Hypertension:.  Continue metoprolol and amlodipine.  Placed on PRN IV hydralazine with parameters  4.  Hypothyroidism: TSH level normal.  continue Synthroid  5.  Falls: Fall precautions. CT of the head with no acute findings. Patient seen by physical therapist.  Recommended skilled nursing facility placement.  Case manager to work on placement after the weekend    DVT prophylaxis: Lovenox  All the records are reviewed and case discussed with Care Management/Social Worker. Management plans discussed with the patient, family and they are in agreement. I called and updated patient's husband on treatment and plans for skilled nursing facility placement after the weekend.  CODE STATUS: Full Code  TOTAL TIME TAKING CARE OF THIS PATIENT: 35 minutes.   More than 50% of the time was spent in counseling/coordination of care: YES  POSSIBLE D/C IN 1-2 DAYS, DEPENDING ON CLINICAL  CONDITION.   [name] M.D on 05/04/2019 at 10:57 AM  Between 7am to 6pm - Pager - [phone]  After 6pm go to [email or web address] - Proofreader  [facility]  Office  [phone]  CC: Primary care physician; Breanna Hire, MD  Note: This dictation was prepared with Dragon dictation along with smaller phrase technology. Any transcriptional errors that result from this process are unintentional.

## 2019-05-05 LAB — BASIC METABOLIC PANEL
Anion gap: 9 (ref 5–15)
BUN: 20 mg/dL (ref 8–23)
CO2: 33 mmol/L — ABNORMAL HIGH (ref 22–32)
Calcium: 9.3 mg/dL (ref 8.9–10.3)
Chloride: 91 mmol/L — ABNORMAL LOW (ref 98–111)
Creatinine, Ser: 0.97 mg/dL (ref 0.44–1.00)
GFR calc Af Amer: >60 mL/min (ref >60)
GFR calc non Af Amer: 59 mL/min — ABNORMAL LOW (ref >60)
Glucose, Bld: 110 mg/dL — ABNORMAL HIGH (ref 70–99)
Potassium: 4.1 mmol/L (ref 3.5–5.1)
Sodium: 133 mmol/L — ABNORMAL LOW (ref 135–145)

## 2019-05-05 LAB — CBC
HCT: 40.1 % (ref 36.0–46.0)
Hemoglobin: 13.2 g/dL (ref 12.0–15.0)
MCH: 27.8 pg (ref 26.0–34.0)
MCHC: 32.9 g/dL (ref 30.0–36.0)
MCV: 84.4 fL (ref 80.0–100.0)
Platelets: 374 10*3/uL (ref 150–400)
RBC: 4.75 MIL/uL (ref 3.87–5.11)
RDW: 14.7 % (ref 11.5–15.5)
WBC: 17.4 10*3/uL — ABNORMAL HIGH (ref 4.0–10.5)
nRBC: 0 % (ref 0.0–0.2)

## 2019-05-05 LAB — ABO/RH: ABO/RH(D): A POS

## 2019-05-05 LAB — MAGNESIUM: Magnesium: 2.2 mg/dL (ref 1.7–2.4)

## 2019-05-05 MED ORDER — GABAPENTIN 100 MG PO CAPS: 200.0000 mg | ORAL_CAPSULE | Freq: Every day | ORAL | 0 refills | Status: AC

## 2019-05-05 MED ORDER — PREDNISONE 20 MG PO TABS: 40.0000 mg | ORAL_TABLET | Freq: Every day | ORAL | 0 refills | Status: DC

## 2019-05-05 MED ORDER — ENOXAPARIN SODIUM 40 MG/0.4ML ~~LOC~~ SOLN: 40.0000 mg | Freq: Two times a day (BID) | SUBCUTANEOUS | Status: DC

## 2019-05-05 MED ORDER — FUROSEMIDE 20 MG PO TABS: 20.0000 mg | ORAL_TABLET | Freq: Every day | ORAL | 0 refills | Status: DC

## 2019-05-05 MED ORDER — SODIUM CHLORIDE 1 G PO TABS: 1.0000 g | ORAL_TABLET | Freq: Three times a day (TID) | ORAL | 0 refills | Status: DC

## 2019-05-05 MED ORDER — CITALOPRAM HYDROBROMIDE 10 MG PO TABS: 10.0000 mg | ORAL_TABLET | Freq: Every day | ORAL | 0 refills | Status: DC

## 2019-05-05 NOTE — Progress Notes (Signed)
IV and PICC removed from patient. Tele removed. Patient given discharge instructions along with hard copy prescription. Verbalized understanding. No acute distress at this time. Husband to transport patient home.

## 2019-05-05 NOTE — Discharge Summary (Signed)
[name] at [facility] NAME: [name]    MR#:  [id number]  DATE OF BIRTH:  Oct 23, 1946  DATE OF ADMISSION:  04/27/2019 ADMITTING PHYSICIAN: Harrie Foreman, MD  DATE OF DISCHARGE: 05/05/2019   PRIMARY CARE PHYSICIAN: Baxter Hire, MD    ADMISSION DIAGNOSIS:  Hallucinations [R44.3] Dehydration [E86.0] Hyponatremia [E87.1] Weakness [R53.1] Frequent falls [R29.6]  DISCHARGE DIAGNOSIS:  Active Problems:   Hyponatremia   Hallucinations, visual   SECONDARY DIAGNOSIS:   Past Medical History:  Diagnosis Date  . Allergy   . Asthma   . Back pain   . Bronchitis   . COPD (chronic obstructive pulmonary disease) ([name])   . Degenerative disc disease, lumbar   . Depression   . FHx: cholecystectomy   . GERD (gastroesophageal reflux disease)   . H/O: hysterectomy   . Headache   . Hypertension   . Murmur   . Osteoporosis   . Pneumonia 03/31/16   being treated by PCP  . Restless legs syndrome   . Scoliosis   . Shingles   . Thyroid disease     HOSPITAL COURSE:   1. Hyponatremia: No seizure. Reported to have intermittent confusion and hallucinations Sodium level was 117 on presentation.  Evaluated by nephrologist.  Work-up so far suggestive of underlying SIADH. Serum osmolality of 245.  Urine osmolality of 335.  Urine sodium of 98.  TSH level normal.  Cortisol level of 37.2.  Patient started on 3% saline previously.  3% saline discontinued previously due to improvement in sodium level .sodium level of 131> 133 today. CT chest to rule out underlying lung mass done revealed small focal inflammatory changes in the left lung apex but no evidence of malignancy in the lungs. CT scan of the head without acute findings.  Clinically no evidence of infectious process found. Plan is to continue sodium chloride tablets 1 g p.o. 3 times daily.  Lasix 20 mg p.o. daily.  1.2 L daily fluid restriction.  Appreciate nephrology input.  2.   Acute metabolic encephalopathy; significantly improved clinically. Likely related to hyponatremia.  Patient was having intermittent hallucinations.  Hallucinations Patient was seen by psychiatry service due to hallucinations.  Notified by nursing staff.IVC was initiated on admission due to hallucinations as well.  Patient cleared by psychiatrist.  No indication for inpatient psychiatric admission. Psychiatric service gradually tapering off Celexa and gabapentin which can cause hyponatremia.  Appreciate psychiatry input.  Decreased gabapentin to 200 mg p.o. nightly.  This dose can be continued until follow-up with psychiatrist.  Decreased Celexa to 10 mg p.o. daily.  To continue on this dose.  I updated patient's husband , who lives with the patient at home and she confirmed she took out the IVC papers because over the last 3 months patient has been progressively getting more confused and having recurrent falls at home.  Family having difficulty with caring for patient at home as well.  Patient cleared by psych and IVC already rescinded.  No indication for admission to inpatient psychiatric unit.  3. Hypertension:. Continue metoprolol and amlodipine.  Placed on PRN IV hydralazine with parameters  4. Hypothyroidism: TSH level normal.  continue Synthroid  5. Falls: Fall precautions. CT of the head with no acute findings. Patient seen by physical therapist.  Recommended skilled nursing facility placement.    But after improvement in her sodium level, she was feeling much stronger and on reevaluation with physical therapy they agreed on discharge home with  home health.  I spoke to patient's husband on phone and he agreed with that plan.   DVT prophylaxis: Lovenox  DISCHARGE CONDITIONS:   Stable.  CONSULTS OBTAINED:  Treatment Team:  Anthonette Legato, MD  DRUG ALLERGIES:   Allergies  Allergen Reactions  . Adhesive [Tape] Other (See Comments)  . Other Rash    Plastic tape     DISCHARGE MEDICATIONS:   Allergies as of 05/05/2019      Reactions   Adhesive [tape] Other (See Comments)   Other Rash   Plastic tape      Medication List    STOP taking these medications   alendronate 70 MG tablet Commonly known as: FOSAMAX   ibuprofen 200 MG tablet Commonly known as: ADVIL     TAKE these medications   albuterol 108 (90 Base) MCG/ACT inhaler Commonly known as: VENTOLIN HFA Inhale 1-2 puffs into the lungs every 6 (six) hours as needed for wheezing or shortness of breath.   amLODipine 5 MG tablet Commonly known as: NORVASC Take 5 mg by mouth daily.   citalopram 10 MG tablet Commonly known as: CELEXA Take 1 tablet (10 mg total) by mouth daily. Start taking on: May 06, 2019 What changed:   medication strength  how much to take   furosemide 20 MG tablet Commonly known as: LASIX Take 1 tablet (20 mg total) by mouth daily. Start taking on: May 06, 2019   gabapentin 100 MG capsule Commonly known as: NEURONTIN Take 2 capsules (200 mg total) by mouth at bedtime. What changed:   medication strength  how much to take  when to take this   levothyroxine 112 MCG tablet Commonly known as: SYNTHROID Take 112 mcg by mouth daily before breakfast.   meloxicam 15 MG tablet Commonly known as: MOBIC Take 15 mg by mouth daily.   metoprolol tartrate 100 MG tablet Commonly known as: LOPRESSOR Take 100 mg by mouth 2 (two) times daily.   multivitamin tablet Take 1 tablet by mouth daily. Reported on 04/05/2016   omeprazole 20 MG capsule Commonly known as: PRILOSEC Take 20 mg by mouth daily.   potassium chloride SA 20 MEQ tablet Commonly known as: K-DUR Take 20 mEq by mouth daily.   predniSONE 20 MG tablet Commonly known as: Deltasone Take 2 tablets (40 mg total) by mouth daily.   sodium chloride 1 g tablet Take 1 tablet (1 g total) by mouth 3 (three) times daily with meals.   tiotropium 18 MCG inhalation capsule Commonly known as: Spiriva  HandiHaler Place 1 capsule (18 mcg total) into inhaler and inhale daily.   traMADol 50 MG tablet Commonly known as: Ultram Take 1 tablet (50 mg total) by mouth every 6 (six) hours as needed for moderate pain or severe pain.   Vitamin D 50 MCG (2000 UT) tablet Take 2,000 Units by mouth daily.        DISCHARGE INSTRUCTIONS:    Follow with primary care physician within 1 week.  If you experience worsening of your admission symptoms, develop shortness of breath, life threatening emergency, suicidal or homicidal thoughts you must seek medical attention immediately by calling 911 or calling your MD immediately  if symptoms less severe.  You Must read complete instructions/literature along with all the possible adverse reactions/side effects for all the Medicines you take and that have been prescribed to you. Take any new Medicines after you have completely understood and accept all the possible adverse reactions/side effects.   Please note  You were  cared for by a hospitalist during your hospital stay. If you have any questions about your discharge medications or the care you received while you were in the hospital after you are discharged, you can call the unit and asked to speak with the hospitalist on call if the hospitalist that took care of you is not available. Once you are discharged, your primary care physician will handle any further medical issues. Please note that NO REFILLS for any discharge medications will be authorized once you are discharged, as it is imperative that you return to your primary care physician (or establish a relationship with a primary care physician if you do not have one) for your aftercare needs so that they can reassess your need for medications and monitor your lab values.    Today   CHIEF COMPLAINT:   Chief Complaint  Patient presents with  . Headache  . Altered Mental Status    HISTORY OF PRESENT ILLNESS:  [name]  is a 73 y.o. female with  a known history of hypothyroidism, hypertension and COPD presents to the emergency department with confusion.  This is her fourth visit to the emergency department in 2 weeks. The patient also complains of headache and has had falls this week.  CT of her head showed no intracranial abnormality.  However, laboratory evaluation revealed sodium of 117.  Thus the emergency department staff called the hospitalist service for further management.   VITAL SIGNS:  Blood pressure 132/80, pulse 69, temperature 98.2 F (36.8 C), temperature source Oral, resp. rate 18, height 4\' 10"  (1.473 m), weight 91.8 kg, SpO2 99 %.  I/O:    Intake/Output Summary (Last 24 hours) at 05/05/2019 1531 Last data filed at 05/05/2019 1517 Gross per 24 hour  Intake 840 ml  Output 850 ml  Net -10 ml    PHYSICAL EXAMINATION:  GENERAL:  73 y.o.-year-old patient lying in the bed with no acute distress.  EYES: Pupils equal, round, reactive to light and accommodation. No scleral icterus. Extraocular muscles intact.  HEENT: Head atraumatic, normocephalic. Oropharynx and nasopharynx clear.  NECK:  Supple, no jugular venous distention. No thyroid enlargement, no tenderness.  LUNGS: Normal breath sounds bilaterally, no wheezing, rales,rhonchi or crepitation. No use of accessory muscles of respiration.  CARDIOVASCULAR: S1, S2 normal. No murmurs, rubs, or gallops.  ABDOMEN: Soft, non-tender, non-distended. Bowel sounds present. No organomegaly or mass.  EXTREMITIES: No pedal edema, cyanosis, or clubbing.  NEUROLOGIC: Cranial nerves II through XII are intact. Muscle strength 4/5 in all extremities. Sensation intact. Gait not checked.  PSYCHIATRIC: The patient is alert and oriented x 3.  SKIN: No obvious rash, lesion, or ulcer.   DATA REVIEW:   CBC Recent Labs  Lab 05/05/19 1059  WBC 17.4*  HGB 13.2  HCT 40.1  PLT 374    Chemistries  Recent Labs  Lab 05/05/19 1059  NA 133*  K 4.1  CL 91*  CO2 33*  GLUCOSE 110*  BUN  20  CREATININE 0.97  CALCIUM 9.3  MG 2.2    Cardiac Enzymes No results for input(s): TROPONINI in the last 168 hours.  Microbiology Results  Results for orders placed or performed during the hospital encounter of 04/27/19  SARS Coronavirus 2 (CEPHEID - Performed in [facility] hospital lab), [facility] Order     Status: None   Collection Time: 04/27/19  3:19 AM   Specimen: Nasopharyngeal Swab  Result Value Ref Range Status   SARS Coronavirus 2 NEGATIVE NEGATIVE Final  Comment: (NOTE) If result is NEGATIVE SARS-CoV-2 target nucleic acids are NOT DETECTED. The SARS-CoV-2 RNA is generally detectable in upper and lower  respiratory specimens during the acute phase of infection. The lowest  concentration of SARS-CoV-2 viral copies this assay can detect is 250  copies / mL. A negative result does not preclude SARS-CoV-2 infection  and should not be used as the sole basis for treatment or other  patient management decisions.  A negative result may occur with  improper specimen collection / handling, submission of specimen other  than nasopharyngeal swab, presence of viral mutation(s) within the  areas targeted by this assay, and inadequate number of viral copies  (<250 copies / mL). A negative result must be combined with clinical  observations, patient history, and epidemiological information. If result is POSITIVE SARS-CoV-2 target nucleic acids are DETECTED. The SARS-CoV-2 RNA is generally detectable in upper and lower  respiratory specimens dur ing the acute phase of infection.  Positive  results are indicative of active infection with SARS-CoV-2.  Clinical  correlation with patient history and other diagnostic information is  necessary to determine patient infection status.  Positive results do  not rule out bacterial infection or co-infection with other viruses. If result is PRESUMPTIVE POSTIVE SARS-CoV-2 nucleic acids MAY BE PRESENT.   A presumptive positive result was obtained  on the submitted specimen  and confirmed on repeat testing.  While 2019 novel coronavirus  (SARS-CoV-2) nucleic acids may be present in the submitted sample  additional confirmatory testing may be necessary for epidemiological  and / or clinical management purposes  to differentiate between  SARS-CoV-2 and other Sarbecovirus currently known to infect humans.  If clinically indicated additional testing with an alternate test  methodology [phone]) is advised. The SARS-CoV-2 RNA is generally  detectable in upper and lower respiratory sp ecimens during the acute  phase of infection. The expected result is Negative. Fact Sheet for Patients:  StrictlyIdeas.no Fact Sheet for Healthcare Providers: BankingDealers.co.za This test is not yet approved or cleared by the [place] FDA and has been authorized for detection and/or diagnosis of SARS-CoV-2 by FDA under an Emergency Use Authorization (EUA).  This EUA will remain in effect (meaning this test can be used) for the duration of the COVID-19 declaration under Section 564(b)(1) of the Act, 21 U.S.C. section 360bbb-3(b)(1), unless the authorization is terminated or revoked sooner. Performed at [facility], [address] [place], [place] [address]   MRSA PCR Screening     Status: None   Collection Time: 04/29/19 11:13 AM   Specimen: Nasal Mucosa; Nasopharyngeal  Result Value Ref Range Status   MRSA by PCR NEGATIVE NEGATIVE Final    Comment:        The GeneXpert MRSA Assay (FDA approved for NASAL specimens only), is one component of a comprehensive MRSA colonization surveillance program. It is not intended to diagnose MRSA infection nor to guide or monitor treatment for MRSA infections. Performed at [facility], [address] [place], [place] [address]     RADIOLOGY:  No results found.  EKG:   Orders placed or performed during the hospital encounter of  04/27/19  . ED EKG  . ED EKG  . EKG 12-Lead  . EKG 12-Lead      Management plans discussed with the patient, family and they are in agreement.  CODE STATUS:     Code Status Orders  (From admission, onward)         Start  Ordered   04/27/19 0639  Full code  Continuous     04/27/19 0638        Code Status History    Date Active Date Inactive Code Status Order ID Comments User Context   11/05/2016 0010 11/08/2016 1721 Full Code [id number]  Mikael Spray, NP ED   03/06/2015 1218 03/08/2015 1540 Full Code [id number]  Vaughan Basta, MD Inpatient   Advance Care Planning Activity      TOTAL TIME TAKING CARE OF THIS PATIENT: 35 minutes.    Vaughan Basta M.D on 05/05/2019 at 3:31 PM  Between 7am to 6pm - Pager - [phone]  After 6pm go to [email or web address] - password [email or web address] [place] [facility]  Office  [phone]  CC: Primary care physician; Baxter Hire, MD   Note: This dictation was prepared with Dragon dictation along with smaller phrase technology. Any transcriptional errors that result from this process are unintentional.

## 2019-05-05 NOTE — Progress Notes (Signed)
PHARMACIST - PHYSICIAN COMMUNICATION  CONCERNING:  Enoxaparin (Lovenox) for DVT Prophylaxis    RECOMMENDATION: Patient was prescribed enoxaprin 40mg  q24 hours for VTE prophylaxis.   Filed Weights   05/03/19 0445 05/04/19 0349 05/05/19 0434  Weight: 201 lb 9.6 oz (91.4 kg) 202 lb 3.2 oz (91.7 kg) 202 lb 6.4 oz (91.8 kg)    Body mass index is 42.3 kg/m.  Estimated Creatinine Clearance: 51.5 mL/min (by C-G formula based on SCr of 0.97 mg/dL).   Based on [place] patient is candidate for enoxaparin 40mg  every 12 hour dosing due to BMI being >40.   DESCRIPTION: Pharmacy has adjusted enoxaparin dose per [facility] policy.  Patient is now receiving enoxaparin 40mg  every 12 hours.   [name], PharmD Clinical Pharmacist  05/05/2019 1:01 PM

## 2019-05-05 NOTE — NC FL2 (Signed)
[place] LEVEL OF CARE SCREENING TOOL     IDENTIFICATION  Patient Name: [name] Birthdate: [birth year] Sex: female Admission Date (Current Location): 04/27/2019  [place] and [place] Number:  Engineering geologist and Address:  [facility], [address] [place],  [address]      Provider Number: [id number]  Attending Physician Name and Address:  Vaughan Basta, *  Relative Name and Phone Number:       Current Level of Care: Hospital Recommended Level of Care: [place] Prior Approval Number:    Date Approved/Denied: 05/05/19 PASRR Number: [id number]  Discharge Plan: SNF    Current Diagnoses: Patient Active Problem List   Diagnosis Date Noted  . Hyponatremia 04/27/2019  . Hallucinations, visual 04/27/2019  . Iron deficiency anemia 02/01/2018  . Acute exacerbation of chronic obstructive pulmonary disease (COPD) ([place]) 11/05/2016  . Spinal stenosis, lumbar region, with neurogenic claudication 07/08/2015  . Sacroiliac joint dysfunction 06/28/2015  . Greater trochanteric bursitis 04/10/2015  . Sacroiliac joint disease 03/14/2015  . Facet syndrome, lumbar 03/14/2015  . HTN (hypertension) 03/06/2015  . Thyroid disease 03/06/2015  . DDD (degenerative disc disease), cervical 03/04/2015  . DDD (degenerative disc disease), lumbar 03/04/2015  . DJD (degenerative joint disease) of knee 03/04/2015    Orientation RESPIRATION BLADDER Height & Weight     Self, Time  Normal Continent Weight: 202 lb 6.4 oz (91.8 kg) Height:  4\' 10"  (147.3 cm)  BEHAVIORAL SYMPTOMS/MOOD NEUROLOGICAL BOWEL NUTRITION STATUS      Continent Diet(regular)  AMBULATORY STATUS COMMUNICATION OF NEEDS Skin   Limited Assist Verbally Normal                       Personal Care Assistance Level of Assistance  Bathing, Dressing, Feeding Bathing Assistance: Limited assistance Feeding assistance: Independent Dressing Assistance:  Limited assistance     Functional Limitations Info             SPECIAL CARE FACTORS FREQUENCY  PT (By licensed PT), OT (By licensed OT)     PT Frequency: 5 OT Frequency: 5            Contractures      Additional Factors Info  Code Status, Allergies Code Status Info: full code Allergies Info: Adhesive (Tape) Other           Current Medications (05/05/2019):  This is the current hospital active medication list Current Facility-Administered Medications  Medication Dose Route Frequency Provider Last Rate Last Dose  . acetaminophen (TYLENOL) tablet 650 mg  650 mg Oral Q6H PRN Harrie Foreman, MD   650 mg at 05/04/19 2011   Or  . acetaminophen (TYLENOL) suppository 650 mg  650 mg Rectal Q6H PRN Harrie Foreman, MD      . albuterol (PROVENTIL) (2.5 MG/3ML) 0.083% nebulizer solution 2.5 mg  2.5 mg Nebulization Q4H PRN Harrie Foreman, MD   2.5 mg at 04/28/19 0931  . amLODipine (NORVASC) tablet 5 mg  5 mg Oral Daily Harrie Foreman, MD   5 mg at 05/05/19 0835  . cholecalciferol (VITAMIN D) tablet 2,000 Units  2,000 Units Oral Daily Harrie Foreman, MD   2,000 Units at 05/05/19 [phone]  . citalopram (CELEXA) tablet 10 mg  10 mg Oral Daily Ravi, Himabindu, MD   10 mg at 05/05/19 0835  . docusate sodium (COLACE) capsule 100 mg  100 mg Oral BID Harrie Foreman, MD   100 mg  at 05/05/19 0835  . enoxaparin (LOVENOX) injection 40 mg  40 mg Subcutaneous Q24H Ojie, Jude, MD   40 mg at 05/05/19 0831  . furosemide (LASIX) tablet 20 mg  20 mg Oral Daily Lateef, Munsoor, MD   20 mg at 05/05/19 0835  . gabapentin (NEURONTIN) capsule 200 mg  200 mg Oral QHS Ravi, Himabindu, MD   200 mg at 05/04/19 2010  . hydrALAZINE (APRESOLINE) injection 10 mg  10 mg Intravenous Q6H PRN Ojie, Jude, MD      . levothyroxine (SYNTHROID) tablet 112 mcg  112 mcg Oral QAC breakfast Harrie Foreman, MD   112 mcg at 05/05/19 [phone]  . metoprolol tartrate (LOPRESSOR) tablet 100 mg  100 mg Oral BID  Harrie Foreman, MD   100 mg at 05/05/19 0834  . morphine 2 MG/ML injection 0.5-1 mg  0.5-1 mg Intravenous Q4H PRN Mansy, Jan A, MD   1 mg at 05/04/19 3825  . multivitamin with minerals tablet 1 tablet  1 tablet Oral Daily Harrie Foreman, MD   1 tablet at 05/05/19 [phone]  . ondansetron (ZOFRAN) injection 4 mg  4 mg Intravenous Q6H PRN Ottie Glazier, MD   4 mg at 05/04/19 1337  . pantoprazole (PROTONIX) EC tablet 40 mg  40 mg Oral Daily Charlett Nose, RPH   40 mg at 05/05/19 0835  . polyethylene glycol (MIRALAX / GLYCOLAX) packet 17 g  17 g Oral Daily PRN Stark Jock, Jude, MD   17 g at 05/04/19 0923  . predniSONE (DELTASONE) tablet 40 mg  40 mg Oral Q breakfast Harrie Foreman, MD   40 mg at 05/05/19 0835  . sodium chloride flush (NS) 0.9 % injection 10-40 mL  10-40 mL Intracatheter Q12H Ojie, Jude, MD   10 mL at 05/05/19 0836  . sodium chloride flush (NS) 0.9 % injection 10-40 mL  10-40 mL Intracatheter PRN Ojie, Jude, MD      . sodium chloride tablet 1 g  1 g Oral TID WC Ottie Glazier, MD   1 g at 05/05/19 0834  . tiotropium (SPIRIVA) inhalation capsule ([facility] use ONLY) 18 mcg  18 mcg Inhalation Daily Harrie Foreman, MD   18 mcg at 05/05/19 7673     Discharge Medications: Please see discharge summary for a list of discharge medications.  Relevant Imaging Results:  Relevant Lab Results:   Additional Information [id number]  Weston Anna, LCSW

## 2019-05-05 NOTE — TOC Initial Note (Signed)
Transition of Care [facility]) - Initial/Assessment Note    Patient Details  Name: [name] MRN: [id number] Date of Birth: Dec 05, 1945  [facility] CM/SW Contact:    Weston Anna, LCSW Phone Number: 05/05/2019, 9:50 AM  Clinical Narrative:                  CSW spoke with patients son, Louis, regarding disposition plans- PT is currently recommending SNF placement at discharge. Son is agreeable to this plan but has no preference for facility at this time due to not being familiar with facilities in the area. Son did have questions regarding the difference between Medicare/ Medicaid and long-term care. CSW explained that typically individuals have Medicaid to pay for long-term placement if it is needed- son voiced that this has been something their family has been considering due to patient not taking care of her self.   CSW explained that Medicaid can take some time to apply for/ receive and that SNF placement may be a good option at this time for family to decide/ work on starting a Medicaid application. Son voiced understanding and will update patients spouse. CSW will continue to follow.   Plan: SNF placement at discharge.    Expected Discharge Plan: Skilled Nursing Facility Barriers to Discharge: No Barriers Identified   Patient Goals and CMS Choice Patient states their goals for this hospitalization and ongoing recovery are:: getting back home or long term care CMS Medicare.gov Compare Post Acute Care list provided to:: (not provided yet)    Expected Discharge Plan and Services Expected Discharge Plan: [place] In-house Referral: Clinical Social Work Discharge Planning Services: CM Consult   Living arrangements for the past 2 months: [place]                                      Prior Living Arrangements/Services Living arrangements for the past 2 months: Single Family Home Lives with:: Spouse Patient language and need for  interpreter reviewed:: Yes Do you feel safe going back to the place where you live?: Yes      Need for Family Participation in Patient Care: Yes (Comment) Care giver support system in place?: Yes (comment)(patients son and spouse are involved)   Criminal Activity/Legal Involvement Pertinent to Current Situation/Hospitalization: No - Comment as needed  Activities of Daily Living      Permission Sought/Granted                  Emotional Assessment   Attitude/Demeanor/Rapport: Engaged(spoke with patients son) Affect (typically observed): Accepting, Appropriate, Calm Orientation: : Fluctuating Orientation (Suspected and/or reported Sundowners)   Psych Involvement: Yes (comment)(psych signed off)  Admission diagnosis:  Hallucinations [R44.3] Dehydration [E86.0] Hyponatremia [E87.1] Weakness [R53.1] Frequent falls [R29.6] Patient Active Problem List   Diagnosis Date Noted  . Hyponatremia 04/27/2019  . Hallucinations, visual 04/27/2019  . Iron deficiency anemia 02/01/2018  . Acute exacerbation of chronic obstructive pulmonary disease (COPD) ([place]) 11/05/2016  . Spinal stenosis, lumbar region, with neurogenic claudication 07/08/2015  . Sacroiliac joint dysfunction 06/28/2015  . Greater trochanteric bursitis 04/10/2015  . Sacroiliac joint disease 03/14/2015  . Facet syndrome, lumbar 03/14/2015  . HTN (hypertension) 03/06/2015  . Thyroid disease 03/06/2015  . DDD (degenerative disc disease), cervical 03/04/2015  . DDD (degenerative disc disease), lumbar 03/04/2015  . DJD (degenerative joint disease) of knee 03/04/2015   PCP:  Harrel Lemon  D, MD Pharmacy:   [facility] [address] ([address]), [place] [address] [place]) [place] [address] Phone: [phone] Fax: [phone]     Social Determinants of Health (SDOH) Interventions    Readmission Risk Interventions No flowsheet data found.

## 2019-05-05 NOTE — TOC Transition Note (Signed)
Transition of Care [facility]) - CM/SW Discharge Note   Patient Details  Name: LATISIA HILAIRE MRN: [id number] Date of Birth: Apr 24, 1946  Transition of Care [facility]) CM/SW Contact:  Weston Anna, LCSW Phone Number:  [phone] 05/05/2019, 3:38 PM   Clinical Narrative:     PT recommendations have changed to [facility]- patient already active with [facility]. Notified Helene Kelp with [facility] of patients discharge and they are able to continue her care. PT/OT/RN have been ordered by MD.   CSW spoke with patients son, Luiz Ochoa, to notify him of patients discharge- stated he would contact patients spouse.   No further needs from CSW at this time- set to discharge home with [facility].     Barriers to Discharge: No Barriers Identified   Patient Goals and CMS Choice Patient states their goals for this hospitalization and ongoing recovery are:: getting back home or long term care CMS Medicare.gov Compare Post Acute Care list provided to:: (not provided yet)    Discharge Placement                       Discharge Plan and Services In-house Referral: Clinical Social Work Discharge Planning Services: CM Consult                                 Social Determinants of Health (SDOH) Interventions     Readmission Risk Interventions No flowsheet data found.

## 2019-05-05 NOTE — Progress Notes (Signed)
[facility]  ROUNDING NOTE   Subjective:   Na 133   Objective:  Vital signs in last 24 hours:  Temp:  [98.1 F (36.7 C)-98.6 F (37 C)] 98.2 F (36.8 C) (07/06 0719) Pulse Rate:  [64-72] 64 (07/06 0719) Resp:  [17-20] 18 (07/06 0719) BP: (107-137)/(75-85) 132/80 (07/06 0719) SpO2:  [91 %-95 %] 95 % (07/06 0719) Weight:  [91.8 kg] 91.8 kg (07/06 0434)  Weight change: 0.091 kg Filed Weights   05/03/19 0445 05/04/19 0349 05/05/19 0434  Weight: 91.4 kg 91.7 kg 91.8 kg    Intake/Output: I/O last 3 completed shifts: In: 94 [P.O.:480; I.V.:10] Out: 2025 [Urine:2025]   Intake/Output this shift:  Total I/O In: 360 [P.O.:360] Out: -   Physical Exam: General: No acute distress  Head: Normocephalic, atraumatic. Moist oral mucosal membranes  Eyes: Anicteric  Neck: Supple, trachea midline  Lungs:  Bilateral wheezing, normal effort  Heart: S1S2 no rubs  Abdomen:  Soft, nontender, bowel sounds present  Extremities: Trace peripheral edema.  Neurologic: Awake, alert, conversant.  Skin: No lesions       Basic Metabolic Panel: Recent Labs  Lab 04/29/19 0423  04/30/19 0000  04/30/19 1301  05/01/19 0342  05/02/19 0344  05/03/19 0525 05/03/19 0800 05/03/19 1718 05/04/19 0409 05/04/19 0949 05/05/19 1059  NA 120*   < >  --    < > 127*   < > 127*  127*   < > 129*  128*   < > 131* 133* 131* 132* 131* 133*  K 3.2*  --   --    < >  --   --  4.0  --  3.7  --  4.0  --   --  4.0  --  4.1  CL 87*  --   --    < >  --   --  92*  --  92*  --  91*  --   --  91*  --  91*  CO2 26  --   --    < >  --   --  29  --  30  --  32  --   --  33*  --  33*  GLUCOSE 88  --   --    < >  --   --  89  --  86  --  84  --   --  84  --  110*  BUN 5*  --   --    < >  --   --  9  --  10  --  14  --   --  21  --  20  CREATININE 0.34*  --   --    < >  --   --  0.50  --  0.62  --  0.62  --   --  0.58  --  0.97  CALCIUM 8.3*  --   --    < >  --   --  8.6*  --  8.6*  --  9.0  --   --  9.1  --   9.3  MG 1.7  --  2.5*  --   --   --  2.1  --  2.1  --   --   --   --   --   --  2.2  PHOS  --   --   --   --  2.2*  --   --   --  3.6  --   --   --   --   --   --   --    < > = values in this interval not displayed.    Liver Function Tests: No results for input(s): AST, ALT, ALKPHOS, BILITOT, PROT, ALBUMIN in the last 168 hours. No results for input(s): LIPASE, AMYLASE in the last 168 hours. No results for input(s): AMMONIA in the last 168 hours.  CBC: Recent Labs  Lab 04/29/19 0423 04/30/19 0531 05/01/19 0342 05/02/19 0344 05/05/19 1059  WBC 8.5 9.9 13.3* 11.1* 17.4*  HGB 11.1* 11.8* 11.7* 11.1* 13.2  HCT 32.9* 35.7* 35.6* 33.8* 40.1  MCV 82.7 84.4 84.8 85.1 84.4  PLT 284 334 314 295 374    Cardiac Enzymes: No results for input(s): CKTOTAL, CKMB, CKMBINDEX, TROPONINI in the last 168 hours.  BNP: Invalid input(s): POCBNP  CBG: Recent Labs  Lab 04/29/19 0821  GLUCAP 76    Microbiology: Results for orders placed or performed during the hospital encounter of 04/27/19  SARS Coronavirus 2 (CEPHEID - Performed in [place] hospital lab), Hosp Order     Status: None   Collection Time: 04/27/19  3:19 AM   Specimen: Nasopharyngeal Swab  Result Value Ref Range Status   SARS Coronavirus 2 NEGATIVE NEGATIVE Final    Comment: (NOTE) If result is NEGATIVE SARS-CoV-2 target nucleic acids are NOT DETECTED. The SARS-CoV-2 RNA is generally detectable in upper and lower  respiratory specimens during the acute phase of infection. The lowest  concentration of SARS-CoV-2 viral copies this assay can detect is 250  copies / mL. A negative result does not preclude SARS-CoV-2 infection  and should not be used as the sole basis for treatment or other  patient management decisions.  A negative result may occur with  improper specimen collection / handling, submission of specimen other  than nasopharyngeal swab, presence of viral mutation(s) within the  areas targeted by this assay,  and inadequate number of viral copies  (<250 copies / mL). A negative result must be combined with clinical  observations, patient history, and epidemiological information. If result is POSITIVE SARS-CoV-2 target nucleic acids are DETECTED. The SARS-CoV-2 RNA is generally detectable in upper and lower  respiratory specimens dur ing the acute phase of infection.  Positive  results are indicative of active infection with SARS-CoV-2.  Clinical  correlation with patient history and other diagnostic information is  necessary to determine patient infection status.  Positive results do  not rule out bacterial infection or co-infection with other viruses. If result is PRESUMPTIVE POSTIVE SARS-CoV-2 nucleic acids MAY BE PRESENT.   A presumptive positive result was obtained on the submitted specimen  and confirmed on repeat testing.  While 2019 novel coronavirus  (SARS-CoV-2) nucleic acids may be present in the submitted sample  additional confirmatory testing may be necessary for epidemiological  and / or clinical management purposes  to differentiate between  SARS-CoV-2 and other Sarbecovirus currently known to infect humans.  If clinically indicated additional testing with an alternate test  methodology [phone]) is advised. The SARS-CoV-2 RNA is generally  detectable in upper and lower respiratory sp ecimens during the acute  phase of infection. The expected result is Negative. Fact Sheet for Patients:  StrictlyIdeas.no Fact Sheet for Healthcare Providers: BankingDealers.co.za This test is not yet approved or cleared by the [place] FDA and has been authorized for detection and/or diagnosis of SARS-CoV-2 by FDA under an Emergency Use Authorization (EUA).  This EUA will  remain in effect (meaning this test can be used) for the duration of the COVID-19 declaration under Section 564(b)(1) of the Act, 21 U.S.C. section 360bbb-3(b)(1), unless  the authorization is terminated or revoked sooner. Performed at [facility], [address] [place], [place] [address]   MRSA PCR Screening     Status: None   Collection Time: 04/29/19 11:13 AM   Specimen: Nasal Mucosa; Nasopharyngeal  Result Value Ref Range Status   MRSA by PCR NEGATIVE NEGATIVE Final    Comment:        The GeneXpert MRSA Assay (FDA approved for NASAL specimens only), is one component of a comprehensive MRSA colonization surveillance program. It is not intended to diagnose MRSA infection nor to guide or monitor treatment for MRSA infections. Performed at [facility], [address] [place], [place] [address]     Coagulation Studies: No results for input(s): LABPROT, INR in the last 72 hours.  Urinalysis: No results for input(s): COLORURINE, LABSPEC, PHURINE, GLUCOSEU, HGBUR, BILIRUBINUR, KETONESUR, PROTEINUR, UROBILINOGEN, NITRITE, LEUKOCYTESUR in the last 72 hours.  Invalid input(s): APPERANCEUR    Imaging: No results found.   Medications:    . amLODipine  5 mg Oral Daily  . cholecalciferol  2,000 Units Oral Daily  . citalopram  10 mg Oral Daily  . docusate sodium  100 mg Oral BID  . enoxaparin (LOVENOX) injection  40 mg Subcutaneous Q12H  . furosemide  20 mg Oral Daily  . gabapentin  200 mg Oral QHS  . levothyroxine  112 mcg Oral QAC breakfast  . metoprolol tartrate  100 mg Oral BID  . multivitamin with minerals  1 tablet Oral Daily  . pantoprazole  40 mg Oral Daily  . predniSONE  40 mg Oral Q breakfast  . sodium chloride flush  10-40 mL Intracatheter Q12H  . sodium chloride  1 g Oral TID WC  . tiotropium  18 mcg Inhalation Daily   acetaminophen **OR** acetaminophen, albuterol, hydrALAZINE, morphine injection, ondansetron, polyethylene glycol, sodium chloride flush  Assessment/ Plan:  73 y.o. female  with a PMHx of COPD, depression, scoliosis, restless leg syndrome, GERD, degenertive disc disease, hypertension,  osteoporosis, who was admitted to [facility] on 04/27/2019 for evaluation of altered mental status.   1.  Hyponatremia. - Fluid restriction 1267mL - sodium chloride tabs - furosemide 20mg  daily.    LOS: 8 Erla Bacchi 7/6/20201:17 PM

## 2019-05-05 NOTE — Plan of Care (Signed)
  Problem: Education: Goal: Knowledge of General Education information will improve Description: Including pain rating scale, medication(s)/side effects and non-pharmacologic comfort measures Outcome: Progressing   Problem: Nutrition: Goal: Adequate nutrition will be maintained Outcome: Progressing   Problem: Coping: Goal: Level of anxiety will decrease Outcome: Progressing   Problem: Skin Integrity: Goal: Risk for impaired skin integrity will decrease Outcome: Progressing

## 2019-05-05 NOTE — Progress Notes (Addendum)
Physical Therapy Treatment Patient Details Name: [name] MRN: [id number] DOB: [birth year] Today's Date: 05/05/2019    History of Present Illness Pt is 73 y.o. female presented to ED for headaches and visual hallucinations 04/27/2019. recent ED visits (6/16 for fall, 6/22 and 6/27 for headaches). Admitted for AMS and hyponatremia transferred to CCU 6/30 for desaturation, hallucinations and requiring 3% saline solution. head CT negative, Na 129 today. PMHx of COPD, depression, scoliosis, restless leg syndrome, GERD, degenertive disc disease, hypertension, osteoporosis    PT Comments    Pt agreeable to PT; denies acute pain, but notes chronic back pain due to stenosis. Pt demonstrating subjective baseline ambulation. Pt uses rolling walker in an unorthodox manner with R or occasionally both forearms on hand rest/s with trunk forward flexed; however, from a safety standpoint of keeping rolling walker close enough to the body, pt actually maintains rolling walker in closer/safer proximity to her body in this fashion as opposed to using proper hand placement where pt maintains rolling walker too far forward. Pt demonstrating much improved ambulation distance this session; HR/O2 saturation remains WNL post ambulation. Pt had questions regarding activity levels. Pt notes family does many activities for her. Provided education on seated exercises as well as E conservation techniques. Discharge recommendation changed to HHPT; pt would benefit from a good HEP to improve strength and endurance to improve overall activity/ambulation tolerance. SW updated.  Follow Up Recommendations  Home health PT     Equipment Recommendations       Recommendations for Other Services       Precautions / Restrictions Precautions Precautions: Fall Restrictions Weight Bearing Restrictions: No    Mobility  Bed Mobility               General bed mobility comments: Not tested; up in chair  Transfers Overall  transfer level: Needs assistance Equipment used: Rolling walker (2 wheeled) Transfers: Sit to/from Stand Sit to Stand: Min guard;Supervision         General transfer comment: to/from recliner. Slow to rise sit (primarily due to chronic back pain/stenosis). Good use of hands. Min guard precautionary. Able to demonstrate with supervision  Ambulation/Gait Ambulation/Gait assistance: Min guard Gait Distance (Feet): 215 Feet Assistive device: Rolling walker (2 wheeled) Gait Pattern/deviations: Step-through pattern     General Gait Details: slow speed with forward bent posture (due to back pain/stenosis). Pt often places R forearm on rw and occasionally both while ambulating. Pt noted to hold rw too far forward when proper hand placement used. Several brief stand rest breaks to extend back toward neutral.   Stairs             Wheelchair Mobility    Modified Rankin (Stroke Patients Only)       Balance                                            Cognition Arousal/Alertness: Awake/alert Behavior During Therapy: WFL for tasks assessed/performed Overall Cognitive Status: Within Functional Limits for tasks assessed                                        Exercises General Exercises - Lower Extremity Quad Sets: Strengthening;Both;10 reps Gluteal Sets: Strengthening;Both;10 reps Long Arc Quad: AROM;Both;15 reps Hip ABduction/ADduction: AROM;Both;10 reps;Other (comment)(2 sets)  Hip Flexion/Marching: AROM;Both;20 reps Toe Raises: AROM;Both;20 reps Heel Raises: AROM;Both;20 reps    General Comments        Pertinent Vitals/Pain Pain Assessment: No/denies pain    Home Living                      Prior Function            PT Goals (current goals can now be found in the care plan section) Progress towards PT goals: Progressing toward goals    Frequency           PT Plan Discharge plan needs to be updated     Co-evaluation              AM-PAC PT "6 Clicks" Mobility   Outcome Measure  Help needed turning from your back to your side while in a flat bed without using bedrails?: A Little Help needed moving from lying on your back to sitting on the side of a flat bed without using bedrails?: A Little Help needed moving to and from a bed to a chair (including a wheelchair)?: None Help needed standing up from a chair using your arms (e.g., wheelchair or bedside chair)?: None Help needed to walk in hospital room?: None Help needed climbing 3-5 steps with a railing? : A Lot 6 Click Score: 20    End of Session Equipment Utilized During Treatment: Gait belt Activity Tolerance: Patient tolerated treatment well Patient left: in chair;with call bell/phone within reach;with chair alarm set   PT Visit Diagnosis: Other abnormalities of gait and mobility (R26.89);Difficulty in walking, not elsewhere classified (R26.2);Muscle weakness (generalized) (M62.81);History of falling (Z91.81)     Time: 1791-5056 PT Time Calculation (min) (ACUTE ONLY): 26 min  Charges:  $Gait Training: 8-22 mins $Therapeutic Exercise: 8-22 mins                      Larae Grooms, PTA 05/05/2019, 2:59 PM

## 2019-05-05 NOTE — Care Management Important Message (Signed)
Important Message  Patient Details  Name: [name] MRN: [id number] Date of Birth: Jul 25, 1946   Medicare Important Message Given:  Yes     Juliann Pulse A Eldred Lievanos 05/05/2019, 11:26 AM

## 2019-06-20 ENCOUNTER — Other Ambulatory Visit: Payer: Self-pay

## 2019-06-20 ENCOUNTER — Encounter: Payer: Medicare Other | Attending: Physician Assistant | Admitting: Physician Assistant

## 2019-06-20 DIAGNOSIS — I10 Essential (primary) hypertension: Secondary | ICD-10-CM | POA: Insufficient documentation

## 2019-06-20 DIAGNOSIS — Z6841 Body Mass Index (BMI) 40.0 and over, adult: Secondary | ICD-10-CM | POA: Insufficient documentation

## 2019-06-20 DIAGNOSIS — J449 Chronic obstructive pulmonary disease, unspecified: Secondary | ICD-10-CM | POA: Insufficient documentation

## 2019-06-20 DIAGNOSIS — S31103A Unspecified open wound of abdominal wall, right lower quadrant without penetration into peritoneal cavity, initial encounter: Secondary | ICD-10-CM | POA: Insufficient documentation

## 2019-06-20 DIAGNOSIS — X58XXXA Exposure to other specified factors, initial encounter: Secondary | ICD-10-CM | POA: Diagnosis not present

## 2019-06-20 DIAGNOSIS — B354 Tinea corporis: Secondary | ICD-10-CM | POA: Insufficient documentation

## 2019-06-20 DIAGNOSIS — Z87891 Personal history of nicotine dependence: Secondary | ICD-10-CM | POA: Insufficient documentation

## 2019-06-20 NOTE — Progress Notes (Signed)
LASHYA, PASSE ([id number]) Visit Report for 06/20/2019 Chief Complaint Document Details Patient Name: [name], [name]. Date of Service: 06/20/2019 9:45 AM Medical Record Number: [id number] Patient Account Number: [id number] Date of Birth/Sex: 04-18-1946 (73 y.o. Female) Treating RN: Montey Hora Primary Care Provider: Harrel Lemon Other Clinician: Referring Provider: Harrel Lemon Treating Provider/Extender: Melburn Hake, Srihaan Mastrangelo Weeks in Treatment: 0 Information Obtained from: Patient Chief Complaint Right Lower Quadrant Abdominal Ulcer Electronic Signature(s) Signed: 06/20/2019 10:23:02 AM By: [name] PA-C Entered By: [name] on 06/20/2019 10:23:01 [name] ([id number]) -------------------------------------------------------------------------------- HPI Details Patient Name: [name]. Date of Service: 06/20/2019 9:45 AM Medical Record Number: [id number] Patient Account Number: [id number] Date of Birth/Sex: 03-10-1946 (73 y.o. Female) Treating RN: Montey Hora Primary Care Provider: Harrel Lemon Other Clinician: Referring Provider: Harrel Lemon Treating Provider/Extender: Melburn Hake, Brystol Wasilewski Weeks in Treatment: 0 History of Present Illness HPI Description: 06/20/2019 on evaluation today patient presents for initial inspection here in our office concerning issue that has been going on for the past several weeks with her moisture associated breakdown in the right lower quadrant of the abdominal region underneath her pannus. Subsequently she is not having any pain which is at least good news. Her primary care provider did give her a prescription for nystatin powder as well as doxycycline this ends on the 28th as far as doxycycline is concerned that is just 1 week away. Nonetheless I do not see any evidence of active infection at this time which is good news. With that being said the wound itself does appear to be in a very precarious location right at the  crease which will make it more difficult to heal secondary to moisture issues. Nonetheless I think that potentially an alginate dressing could be of benefit here. No fevers, chills, nausea, vomiting, or diarrhea. She does have a history of COPD, hypertension, obesity, and more recently in this area a tinea corporis infection. Electronic Signature(s) Signed: 06/20/2019 10:36:59 AM By: [name] PA-C Entered By: [name] on 06/20/2019 10:36:58 [name] ([id number]) -------------------------------------------------------------------------------- Physical Exam Details Patient Name: Breanna Cleverly H. Date of Service: 06/20/2019 9:45 AM Medical Record Number: [id number] Patient Account Number: [id number] Date of Birth/Sex: 1945-11-12 (73 y.o. Female) Treating RN: Montey Hora Primary Care Provider: Harrel Lemon Other Clinician: Referring Provider: Harrel Lemon Treating Provider/Extender: Melburn Hake, Balin Vandegrift Weeks in Treatment: 0 Constitutional sitting or standing blood pressure is within target range for patient.. pulse regular and within target range for patient.Marland Kitchen respirations regular, non-labored and within target range for patient.Marland Kitchen temperature within target range for patient.. Well- nourished and well-hydrated in no acute distress. Eyes conjunctiva clear no eyelid edema noted. pupils equal round and reactive to light and accommodation. Ears, Nose, Mouth, and Throat no gross abnormality of ear auricles or external auditory canals. normal hearing noted during conversation. mucus membranes moist. Respiratory normal breathing without difficulty. clear to auscultation bilaterally. Cardiovascular regular rate and rhythm with normal S1, S2. no clubbing, cyanosis, significant edema, <3 sec cap refill. Gastrointestinal (GI) soft, non-tender, non-distended, +BS. no ventral hernia noted. Musculoskeletal Patient unable to walk without assistance. no significant deformity or  arthritic changes, no loss or range of motion, no clubbing. Psychiatric this patient is able to make decisions and demonstrates good insight into disease process. Alert and Oriented x 3. pleasant and cooperative. Notes Upon inspection today patient's wound actually appears to be fairly clean there is no signs of significant infection which is good news. Fortunately she overall seems  to be doing quite well which is nice to see as well. Nonetheless I do believe that moisture is a significant issue here as far as what caused the wound and even the healing is concerned. For that reason I think that utilizing an alginate type dressing will likely be beneficial for the patient and allow her to be able to get this area to heal more effectively. I think that the nystatin powder can still be utilized. Electronic Signature(s) Signed: 06/20/2019 10:37:39 AM By: [name] PA-C Entered By: [name] on 06/20/2019 10:37:38 [name] ([id number]) -------------------------------------------------------------------------------- Physician Orders Details Patient Name: [name]. Date of Service: 06/20/2019 9:45 AM Medical Record Number: [id number] Patient Account Number: [id number] Date of Birth/Sex: [birth year] (73 y.o. Female) Treating RN: Montey Hora Primary Care Provider: Harrel Lemon Other Clinician: Referring Provider: Harrel Lemon Treating Provider/Extender: Melburn Hake, Priscilla Finklea Weeks in Treatment: 0 Verbal / Phone Orders: No Diagnosis Coding ICD-10 Coding Code Description B35.4 Tinea corporis Unspecified open wound of abdominal wall, right lower quadrant without penetration into peritoneal S31.103A cavity, initial encounter I10 Essential (primary) hypertension E66.01 Morbid (severe) obesity due to excess calories J44.9 Chronic obstructive pulmonary disease, unspecified Wound Cleansing Wound #1 Right Abdomen - Lower Quadrant o Dial antibacterial soap, wash wounds,  rinse and pat dry prior to dressing wounds o May Shower, gently pat wound dry prior to applying new dressing. Primary Wound Dressing Wound #1 Right Abdomen - Lower Quadrant o Other: - nystatin powder Secondary Dressing o Alginate Dressing Change Frequency Wound #1 Right Abdomen - Lower Quadrant o Change dressing every day. Follow-up Appointments Wound #1 Right Abdomen - Lower Quadrant o Return Appointment in 1 week. Home Health Wound #1 Right Abdomen - [place] Visits - [place] Nurse may visit PRN to address patientos wound care needs. o FACE TO FACE ENCOUNTER: MEDICARE and MEDICAID PATIENTS: I certify that this patient is under my care and that I had a face-to-face encounter that meets the physician face-to-face encounter requirements with this patient on this date. The encounter with the patient was in whole or in part for the following MEDICAL CONDITION: (primary reason for [place]) MEDICAL NECESSITY: I certify, that based on my findings, NURSING services are a medically necessary home health service. HOME BOUND STATUS: I certify that my clinical findings support that this patient is homebound (i.e., Due to illness or injury, pt requires aid of supportive devices such as crutches, cane, wheelchairs, walkers, the use of special transportation or the assistance of another person to leave their place of residence. There is a normal inability to leave the home [name], [place]. ([id number]) and doing so requires considerable and taxing effort. Other absences are for medical reasons / religious services and are infrequent or of short duration when for other reasons). o If current dressing causes regression in wound condition, may D/C ordered dressing product/s and apply Normal Saline Moist Dressing daily until next [place] / Other MD appointment. [place] of regression in wound condition at  [phone]. o Please direct any NON-WOUND related issues/requests for orders to patient's Primary Care Physician Medications-please add to medication list. Wound #1 Right Abdomen - Lower Quadrant o Other: - nystatin powder Electronic Signature(s) Signed: 06/20/2019 4:48:17 PM By: Montey Hora Signed: 06/20/2019 4:53:08 PM By: [name] PA-C Entered By: Montey Hora on 06/20/2019 10:33:06 [name] ([id number]) -------------------------------------------------------------------------------- Problem List Details Patient Name: Breanna Cleverly H. Date of Service:  06/20/2019 9:45 AM Medical Record Number: [id number] Patient Account Number: [id number] Date of Birth/Sex: 23-Jun-1946 (73 y.o. Female) Treating RN: Montey Hora Primary Care Provider: Harrel Lemon Other Clinician: Referring Provider: Harrel Lemon Treating Provider/Extender: Melburn Hake, Naeemah Jasmer Weeks in Treatment: 0 Active Problems ICD-10 Evaluated Encounter Code Description Active Date Today Diagnosis L98.9 Disorder of the skin and subcutaneous tissue, unspecified 06/20/2019 No Yes B35.4 Tinea corporis 06/20/2019 No Yes S31.103A Unspecified open wound of abdominal wall, right lower 06/20/2019 No Yes quadrant without penetration into peritoneal cavity, initial encounter I10 Essential (primary) hypertension 06/20/2019 No Yes E66.01 Morbid (severe) obesity due to excess calories 06/20/2019 No Yes J44.9 Chronic obstructive pulmonary disease, unspecified 06/20/2019 No Yes Inactive Problems Resolved Problems Electronic Signature(s) Signed: 06/20/2019 10:36:13 AM By: [name] PA-C Previous Signature: 06/20/2019 10:23:40 AM Version By: [name] PA-C Previous Signature: 06/20/2019 10:22:40 AM Version By: [name] PA-C Entered By: [name] on 06/20/2019 10:36:13 Gurevich, [name] ([id number]) -------------------------------------------------------------------------------- Progress Note  Details Patient Name: Breanna Cleverly H. Date of Service: 06/20/2019 9:45 AM Medical Record Number: [id number] Patient Account Number: [id number] Date of Birth/Sex: 10-25-1946 (73 y.o. Female) Treating RN: Montey Hora Primary Care Provider: Harrel Lemon Other Clinician: Referring Provider: Harrel Lemon Treating Provider/Extender: Melburn Hake, Saanvika Vazques Weeks in Treatment: 0 Subjective Chief Complaint Information obtained from Patient Right Lower Quadrant Abdominal Ulcer History of Present Illness (HPI) 06/20/2019 on evaluation today patient presents for initial inspection here in our office concerning issue that has been going on for the past several weeks with her moisture associated breakdown in the right lower quadrant of the abdominal region underneath her pannus. Subsequently she is not having any pain which is at least good news. Her primary care provider did give her a prescription for nystatin powder as well as doxycycline this ends on the 28th as far as doxycycline is concerned that is just 1 week away. Nonetheless I do not see any evidence of active infection at this time which is good news. With that being said the wound itself does appear to be in a very precarious location right at the crease which will make it more difficult to heal secondary to moisture issues. Nonetheless I think that potentially an alginate dressing could be of benefit here. No fevers, chills, nausea, vomiting, or diarrhea. She does have a history of COPD, hypertension, obesity, and more recently in this area a tinea corporis infection. Patient History Information obtained from Patient. Allergies No Known Drug Allergies Family History Cancer - Child, Diabetes - Mother, Heart Disease - Child, Hypertension - Child, Thyroid Problems - Child, No family history of Hereditary Spherocytosis, Kidney Disease, Lung Disease, Seizures, Stroke, Tuberculosis. Social History Former smoker - 40 years - ended on 05/30/2016,  Marital Status - Married, Alcohol Use - Never, Drug Use - No History, Caffeine Use - Daily - soda. Medical History Eyes Denies history of Cataracts, Glaucoma, Optic Neuritis Hematologic/Lymphatic Patient has history of Anemia Denies history of Hemophilia, Human Immunodeficiency Virus, Lymphedema, Sickle Cell Disease Respiratory Patient has history of Asthma, Chronic Obstructive Pulmonary Disease (COPD) Denies history of Pneumothorax, Sleep Apnea, Tuberculosis Cardiovascular Patient has history of Hypertension Denies history of Angina, Arrhythmia, Congestive Heart Failure, Coronary Artery Disease, Deep Vein Thrombosis, Hypotension, Myocardial Infarction, Peripheral Arterial Disease, Peripheral Venous Disease, Phlebitis, Vasculitis Gastrointestinal Denies history of Cirrhosis , Colitis, Crohn s, Hepatitis A, Hepatitis B, Hepatitis C [name], [name] ([id number]) Endocrine Denies history of Type I Diabetes, Type II Diabetes Genitourinary Denies history of End Stage  Renal Disease Immunological Denies history of Lupus Erythematosus, Raynaud s, Scleroderma Integumentary (Skin) Denies history of History of Burn, History of pressure wounds Musculoskeletal Patient has history of Osteoarthritis Denies history of Gout, Rheumatoid Arthritis, Osteomyelitis Neurologic Patient has history of Neuropathy Denies history of Dementia, Quadriplegia, Paraplegia, Seizure Disorder Psychiatric Denies history of Anorexia/bulimia, Confinement Anxiety Review of Systems (ROS) Eyes Complains or has symptoms of Glasses / Contacts. Ear/Nose/Mouth/Throat Denies complaints or symptoms of Difficult clearing ears, Sinusitis. Hematologic/Lymphatic Denies complaints or symptoms of Bleeding / Clotting Disorders, Human Immunodeficiency Virus. Cardiovascular Denies complaints or symptoms of Chest pain, LE edema. Gastrointestinal Denies complaints or symptoms of Frequent diarrhea, Nausea,  Vomiting. Endocrine Complains or has symptoms of Thyroid disease - Hypo. Genitourinary Denies complaints or symptoms of Kidney failure/ Dialysis, Incontinence/dribbling. Immunological Denies complaints or symptoms of Hives, Itching. Integumentary (Skin) Complains or has symptoms of Wounds - Abdomen. Denies complaints or symptoms of Bleeding or bruising tendency, Breakdown, Swelling. Neurologic Tingling-Left foot Psychiatric Complains or has symptoms of Anxiety. Denies complaints or symptoms of Claustrophobia. Objective Constitutional sitting or standing blood pressure is within target range for patient.. pulse regular and within target range for patient.Marland Kitchen respirations regular, non-labored and within target range for patient.Marland Kitchen temperature within target range for patient.. Well- nourished and well-hydrated in no acute distress. Vitals Time Taken: 9:50 AM, Height: 58 in, Weight: 203 lbs, Source: Measured, BMI: 42.4, Temperature: 98.9 F, Pulse: 70 [name], [name] ([id number]) bpm, Respiratory Rate: 18 breaths/min, Blood Pressure: 121/64 mmHg. Eyes conjunctiva clear no eyelid edema noted. pupils equal round and reactive to light and accommodation. Ears, Nose, Mouth, and Throat no gross abnormality of ear auricles or external auditory canals. normal hearing noted during conversation. mucus membranes moist. Respiratory normal breathing without difficulty. clear to auscultation bilaterally. Cardiovascular regular rate and rhythm with normal S1, S2. no clubbing, cyanosis, significant edema, Gastrointestinal (GI) soft, non-tender, non-distended, +BS. no ventral hernia noted. Musculoskeletal Patient unable to walk without assistance. no significant deformity or arthritic changes, no loss or range of motion, no clubbing. Psychiatric this patient is able to make decisions and demonstrates good insight into disease process. Alert and Oriented x 3. pleasant and cooperative. General Notes:  Upon inspection today patient's wound actually appears to be fairly clean there is no signs of significant infection which is good news. Fortunately she overall seems to be doing quite well which is nice to see as well. Nonetheless I do believe that moisture is a significant issue here as far as what caused the wound and even the healing is concerned. For that reason I think that utilizing an alginate type dressing will likely be beneficial for the patient and allow her to be able to get this area to heal more effectively. I think that the nystatin powder can still be utilized. Integumentary (Hair, Skin) Wound #1 status is Open. Original cause of wound was Shear/Friction. The wound is located on the Right Abdomen - Lower Quadrant. The wound measures 1.5cm length x 3cm width x 0.1cm depth; 3.534cm^2 area and 0.353cm^3 volume. There is Fat Layer (Subcutaneous Tissue) Exposed exposed. There is no tunneling or undermining noted. There is a medium amount of serous drainage noted. The wound margin is flat and intact. There is small (1-33%) pink granulation within the wound bed. There is a medium (34-66%) amount of necrotic tissue within the wound bed including Adherent Slough. Assessment Active Problems ICD-10 Disorder of the skin and subcutaneous tissue, unspecified Tinea corporis Unspecified open wound of abdominal wall, right lower quadrant  without penetration into peritoneal cavity, initial encounter Essential (primary) hypertension Morbid (severe) obesity due to excess calories Chronic obstructive pulmonary disease, unspecified [name], [name] ([id number]) Plan Wound Cleansing: Wound #1 Right Abdomen - Lower Quadrant: Dial antibacterial soap, wash wounds, rinse and pat dry prior to dressing wounds May Shower, gently pat wound dry prior to applying new dressing. Primary Wound Dressing: Wound #1 Right Abdomen - Lower Quadrant: Other: - nystatin powder Secondary Dressing: Alginate Dressing  Change Frequency: Wound #1 Right Abdomen - Lower Quadrant: Change dressing every day. Follow-up Appointments: Wound #1 Right Abdomen - Lower Quadrant: Return Appointment in 1 week. Home Health: Wound #1 Right Abdomen - Lower Quadrant: [place] Visits - [place] Nurse may visit PRN to address patient s wound care needs. FACE TO FACE ENCOUNTER: MEDICARE and MEDICAID PATIENTS: I certify that this patient is under my care and that I had a face-to-face encounter that meets the physician face-to-face encounter requirements with this patient on this date. The encounter with the patient was in whole or in part for the following MEDICAL CONDITION: (primary reason for [place]) MEDICAL NECESSITY: I certify, that based on my findings, NURSING services are a medically necessary home health service. HOME BOUND STATUS: I certify that my clinical findings support that this patient is homebound (i.e., Due to illness or injury, pt requires aid of supportive devices such as crutches, cane, wheelchairs, walkers, the use of special transportation or the assistance of another person to leave their place of residence. There is a normal inability to leave the home and doing so requires considerable and taxing effort. Other absences are for medical reasons / religious services and are infrequent or of short duration when for other reasons). If current dressing causes regression in wound condition, may D/C ordered dressing product/s and apply Normal Saline Moist Dressing daily until next [place] / Other MD appointment. [place] of regression in wound condition at [phone]. Please direct any NON-WOUND related issues/requests for orders to patient's Primary Care Physician Medications-please add to medication list.: Wound #1 Right Abdomen - Lower Quadrant: Other: - nystatin powder 1. I would recommend that the patient continue with her nystatin powder  she does have a refill on this still and just got a refill now. Nonetheless she is okay in that regard. 2. I am also going to recommend that we go ahead and initiate treatment with a plain alginate dressing to help with absorption and prevent moisture buildup I think this is can allow the area to heal more effectively. 3. I do think that cleaning the area well before applying the nystatin powder is good to be of utmost importance they seem to be doing this well her granddaughter helps her with the dressing changes which makes things a lot easier it sounds like. We will see patient back for reevaluation in 1 week here in the clinic. If anything worsens or changes patient will contact our office for additional recommendations. Electronic Signature(s) Signed: 06/20/2019 10:38:49 AM By: [name] PA-C Entered By: [name] on 06/20/2019 10:38:48 [name] ([id number]) ANDREE, HEEG ([id number]) -------------------------------------------------------------------------------- ROS/PFSH Details Patient Name: [name]. Date of Service: 06/20/2019 9:45 AM Medical Record Number: [id number] Patient Account Number: [id number] Date of Birth/Sex: [birth year] (72 y.o. Female) Treating RN: Harold Barban Primary Care Provider: Harrel Lemon Other Clinician: Referring Provider: Harrel Lemon Treating Provider/Extender: Melburn Hake, Kody Brandl Weeks in Treatment: 0 Information Obtained From Patient Eyes Complaints and Symptoms: Positive  for: Glasses / Contacts Medical History: Negative for: Cataracts; Glaucoma; Optic Neuritis Ear/Nose/Mouth/Throat Complaints and Symptoms: Negative for: Difficult clearing ears; Sinusitis Hematologic/Lymphatic Complaints and Symptoms: Negative for: Bleeding / Clotting Disorders; Human Immunodeficiency Virus Medical History: Positive for: Anemia Negative for: Hemophilia; Human Immunodeficiency Virus; Lymphedema; Sickle Cell  Disease Cardiovascular Complaints and Symptoms: Negative for: Chest pain; LE edema Medical History: Positive for: Hypertension Negative for: Angina; Arrhythmia; Congestive Heart Failure; Coronary Artery Disease; Deep Vein Thrombosis; Hypotension; Myocardial Infarction; Peripheral Arterial Disease; Peripheral Venous Disease; Phlebitis; Vasculitis Gastrointestinal Complaints and Symptoms: Negative for: Frequent diarrhea; Nausea; Vomiting Medical History: Negative for: Cirrhosis ; Colitis; Crohnos; Hepatitis A; Hepatitis B; Hepatitis C Endocrine Complaints and Symptoms: Positive for: Thyroid disease - Hypo Medical History: Negative for: Type I Diabetes; Type II Diabetes [name], [name] ([id number]) Genitourinary Complaints and Symptoms: Negative for: Kidney failure/ Dialysis; Incontinence/dribbling Medical History: Negative for: End Stage Renal Disease Immunological Complaints and Symptoms: Negative for: Hives; Itching Medical History: Negative for: Lupus Erythematosus; Raynaudos; Scleroderma Integumentary (Skin) Complaints and Symptoms: Positive for: Wounds - Abdomen Negative for: Bleeding or bruising tendency; Breakdown; Swelling Medical History: Negative for: History of Burn; History of pressure wounds Psychiatric Complaints and Symptoms: Positive for: Anxiety Negative for: Claustrophobia Medical History: Negative for: Anorexia/bulimia; Confinement Anxiety Respiratory Medical History: Positive for: Asthma; Chronic Obstructive Pulmonary Disease (COPD) Negative for: Pneumothorax; Sleep Apnea; Tuberculosis Musculoskeletal Medical History: Positive for: Osteoarthritis Negative for: Gout; Rheumatoid Arthritis; Osteomyelitis Neurologic Complaints and Symptoms: Review of System Notes: Tingling-Left foot Medical History: Positive for: Neuropathy Negative for: Dementia; Quadriplegia; Paraplegia; Seizure Disorder Immunizations Pneumococcal Vaccine: Received Pneumococcal  Vaccination: No [name], [name] ([id number]) Tetanus Vaccine: Last tetanus shot: 02/27/2018 Implantable Devices None Family and Social History Cancer: Yes - Child; Diabetes: Yes - Mother; Heart Disease: Yes - Child; Hereditary Spherocytosis: No; Hypertension: Yes - Child; Kidney Disease: No; Lung Disease: No; Seizures: No; Stroke: No; Thyroid Problems: Yes - Child; Tuberculosis: No; Former smoker - 40 years - ended on 05/30/2016; Marital Status - Married; Alcohol Use: Never; Drug Use: No History; Caffeine Use: Daily - soda; Financial Concerns: No; Food, Clothing or Shelter Needs: No; Support System Lacking: No; Transportation Concerns: No Electronic Signature(s) Signed: 06/20/2019 4:09:28 PM By: Harold Barban Signed: 06/20/2019 4:53:08 PM By: [name] PA-C Entered By: Harold Barban on 06/20/2019 10:04:56 [name] ([id number]) -------------------------------------------------------------------------------- SuperBill Details Patient Name: Breanna Cleverly H. Date of Service: 06/20/2019 Medical Record Number: [id number] Patient Account Number: [id number] Date of Birth/Sex: August 02, 1946 (73 y.o. Female) Treating RN: Montey Hora Primary Care Provider: Harrel Lemon Other Clinician: Referring Provider: Harrel Lemon Treating Provider/Extender: Melburn Hake, Jaylend Reiland Weeks in Treatment: 0 Diagnosis Coding ICD-10 Codes Code Description L98.9 Disorder of the skin and subcutaneous tissue, unspecified B35.4 Tinea corporis Unspecified open wound of abdominal wall, right lower quadrant without penetration into peritoneal S31.103A cavity, initial encounter I10 Essential (primary) hypertension E66.01 Morbid (severe) obesity due to excess calories J44.9 Chronic obstructive pulmonary disease, unspecified Facility Procedures CPT4 Code: [id number] Description: 94174 - WOUND CARE VISIT-LEV 4 EST PT Modifier: Quantity: 1 Physician Procedures CPT4: Description Modifier Quantity Code [id number]  85631 - WC PHYS LEVEL 4 - NEW PT 1 ICD-10 Diagnosis Description L98.9 Disorder of the skin and subcutaneous tissue, unspecified B35.4 Tinea corporis S31.103A Unspecified open wound of abdominal wall, right  lower quadrant without penetration into peritoneal cavity, initial encounter I10 Essential (primary) hypertension Electronic Signature(s) Signed: 06/20/2019 10:44:24 AM By: Montey Hora Signed: 06/20/2019 4:53:08 PM By: [name] PA-C Previous Signature: 06/20/2019 10:39:29  AM Version By: [name] PA-C Previous Signature: 06/20/2019 10:39:06 AM Version By: [name] PA-C Entered By: Montey Hora on 06/20/2019 10:44:24

## 2019-06-20 NOTE — Progress Notes (Signed)
[name], [name] ([id number]) Visit Report for 06/20/2019 Abuse/Suicide Risk Screen Details Patient Name: [name], [name]. Date of Service: 06/20/2019 9:45 AM Medical Record Number: [id number] Patient Account Number: [id number] Date of Birth/Sex: Aug 13, 1946 (73 y.o. Female) Treating RN: Harold Barban Primary Care Kitti Mcclish: Harrel Lemon Other Clinician: Referring Tylee Newby: Harrel Lemon Treating Rashied Corallo/Extender: Melburn Hake, HOYT Weeks in Treatment: 0 Abuse/Suicide Risk Screen Items Answer ABUSE RISK SCREEN: Has anyone close to you tried to hurt or harm you recentlyo No Do you feel uncomfortable with anyone in your familyo No Has anyone forced you do things that you didnot want to doo No Electronic Signature(s) Signed: 06/20/2019 4:09:28 PM By: Harold Barban Entered By: Harold Barban on 06/20/2019 10:05:07 Petrovic, [name] ([id number]) -------------------------------------------------------------------------------- Activities of Daily Living Details Patient Name: [name], [name] Date of Service: 06/20/2019 9:45 AM Medical Record Number: [id number] Patient Account Number: [id number] Date of Birth/Sex: Jun 14, 1946 (73 y.o. Female) Treating RN: Harold Barban Primary Care Marlina Cataldi: Harrel Lemon Other Clinician: Referring Arena Lindahl: Harrel Lemon Treating Analya Louissaint/Extender: Melburn Hake, HOYT Weeks in Treatment: 0 Activities of Daily Living Items Answer Activities of Daily Living (Please select one for each item) Drive Automobile Not Able Take Medications Completely Able Use Telephone Completely Able Care for Appearance Completely Able Use Toilet Completely Able Bath / Shower Completely Able Dress Self Completely Able Feed Self Completely Able Walk Completely Able Get In / Out Bed Completely Able Housework Completely Able Prepare Meals Completely Able Handle Money Completely Able Shop for Self Completely Able Electronic Signature(s) Signed: 06/20/2019 4:09:28 PM By: Harold Barban Entered By: Harold Barban on 06/20/2019 10:05:32 Oleski, [name] ([id number]) -------------------------------------------------------------------------------- Education Screening Details Patient Name: Breanna Ferrari. Date of Service: 06/20/2019 9:45 AM Medical Record Number: [id number] Patient Account Number: [id number] Date of Birth/Sex: 1946-10-09 (73 y.o. Female) Treating RN: Harold Barban Primary Care Lashan Gluth: Harrel Lemon Other Clinician: Referring Anthonymichael Munday: Harrel Lemon Treating Shaneya Taketa/Extender: Melburn Hake, HOYT Weeks in Treatment: 0 Primary Learner Assessed: Patient Learning Preferences/Education Level/Primary Language Learning Preference: Explanation Highest Education Level: High School Preferred Language: English Cognitive Barrier Language Barrier: No Translator Needed: No Memory Deficit: No Emotional Barrier: No Cultural/Religious Beliefs Affecting Medical Care: No Physical Barrier Impaired Vision: No Impaired Hearing: No Decreased Hand dexterity: No Knowledge/Comprehension Knowledge Level: High Comprehension Level: High Ability to understand written High instructions: Ability to understand verbal High instructions: Motivation Anxiety Level: Calm Cooperation: Cooperative Education Importance: Acknowledges Need Interest in Health Problems: Asks Questions Perception: Coherent Willingness to Engage in Self- High Management Activities: Readiness to Engage in Self- High Management Activities: Electronic Signature(s) Signed: 06/20/2019 4:09:28 PM By: Harold Barban Entered By: Harold Barban on 06/20/2019 10:05:56 Knittle, [name] ([id number]) -------------------------------------------------------------------------------- Fall Risk Assessment Details Patient Name: Breanna Ferrari. Date of Service: 06/20/2019 9:45 AM Medical Record Number: [id number] Patient Account Number: [id number] Date of Birth/Sex: 05/06/1946 (73 y.o. Female) Treating  RN: Harold Barban Primary Care Kent Bra[place]: Harrel Lemon Other Clinician: Referring Thunder [place]: Harrel Lemon Treating Robben Jagiello/Extender: Melburn Hake, HOYT Weeks in Treatment: 0 Fall Risk Assessment Items Have you had 2 or more falls in the last 12 monthso 0 Yes Have you had any fall that resulted in injury in the last 12 monthso 0 Yes FALLS RISK SCREEN History of falling - immediate or within 3 months 25 Yes Secondary diagnosis (Do you have 2 or more medical diagnoseso) 0 No Ambulatory aid None/bed rest/wheelchair/nurse 0 No Crutches/cane/walker 15 Yes Furniture 0 No Intravenous therapy Access/Saline/Heparin Lock 0 No Gait/Transferring Normal/ bed rest/ wheelchair 0 No Weak (short  steps with or without shuffle, stooped but able to lift head while 0 No walking, may seek support from furniture) Impaired (short steps with shuffle, may have difficulty arising from chair, head 0 No down, impaired balance) Mental Status Oriented to own ability 0 Yes Electronic Signature(s) Signed: 06/20/2019 4:09:28 PM By: Harold Barban Entered By: Harold Barban on 06/20/2019 10:07:03 Grauberger, [name] ([id number]) -------------------------------------------------------------------------------- Foot Assessment Details Patient Name: Breanna Cleverly H. Date of Service: 06/20/2019 9:45 AM Medical Record Number: [id number] Patient Account Number: [id number] Date of Birth/Sex: 16-Sep-1946 (73 y.o. Female) Treating RN: Harold Barban Primary Care Aunisty Reali: Harrel Lemon Other Clinician: Referring Hendry Speas: Harrel Lemon Treating Tyr Franca/Extender: Melburn Hake, HOYT Weeks in Treatment: 0 Foot Assessment Items Site Locations + = Sensation present, - = Sensation absent, C = Callus, U = Ulcer R = Redness, W = Warmth, M = Maceration, PU = Pre-ulcerative lesion F = Fissure, S = Swelling, D = Dryness Assessment Right: Left: Other Deformity: No No Prior Foot Ulcer: No No Prior Amputation: No No Charcot  Joint: No No Ambulatory Status: Gait: Notes Not diabetic, wound is abdomen Electronic Signature(s) Signed: 06/20/2019 4:09:28 PM By: Harold Barban Entered By: Harold Barban on 06/20/2019 10:07:43 Dresden, [name] ([id number]) -------------------------------------------------------------------------------- Nutrition Risk Screening Details Patient Name: Breanna Cleverly H. Date of Service: 06/20/2019 9:45 AM Medical Record Number: [id number] Patient Account Number: [id number] Date of Birth/Sex: 05/31/1946 (73 y.o. Female) Treating RN: Harold Barban Primary Care Roderica Cathell: Harrel Lemon Other Clinician: Referring Baelynn Schmuhl: Harrel Lemon Treating Takasha Vetere/Extender: Melburn Hake, HOYT Weeks in Treatment: 0 Height (in): 58 Weight (lbs): 203 Body Mass Index (BMI): 42.4 Nutrition Risk Screening Items Score Screening NUTRITION RISK SCREEN: I have an illness or condition that made me change the kind and/or amount of 0 No food I eat I eat fewer than two meals per day 0 No I eat few fruits and vegetables, or milk products 0 No I have three or more drinks of beer, liquor or wine almost every day 0 No I have tooth or mouth problems that make it hard for me to eat 0 No I don't always have enough money to buy the food I need 0 No I eat alone most of the time 0 No I take three or more different prescribed or over-the-counter drugs a day 1 Yes Without wanting to, I have lost or gained 10 pounds in the last six months 0 No I am not always physically able to shop, cook and/or feed myself 0 No Nutrition Protocols Good Risk Protocol Moderate Risk Protocol High Risk Proctocol Risk Level: Good Risk Score: 1 Electronic Signature(s) Signed: 06/20/2019 4:09:28 PM By: Harold Barban Entered By: Harold Barban on 06/20/2019 10:07:14

## 2019-06-20 NOTE — Progress Notes (Signed)
[name] ([id number]) Visit Report for 06/20/2019 Allergy List Details Patient Name: [name]. Date of Service: 06/20/2019 9:45 AM Medical Record Number: [id number] Patient Account Number: [id number] Date of Birth/Sex: December 01, 1945 (73 y.o. Female) Treating RN: Harold Barban Primary Care Holley Wirt: Harrel Lemon Other Clinician: Referring Nabria Nevin: Harrel Lemon Treating Shacora Zynda/Extender: STONE III, HOYT Weeks in Treatment: 0 Allergies Active Allergies No Known Drug Allergies Allergy Notes Electronic Signature(s) Signed: 06/20/2019 4:09:28 PM By: Harold Barban Entered By: Harold Barban on 06/20/2019 09:54:56 [name], [name] ([id number]) -------------------------------------------------------------------------------- Arrival Information Details Patient Name: [name]. Date of Service: 06/20/2019 9:45 AM Medical Record Number: [id number] Patient Account Number: [id number] Date of Birth/Sex: 16-Mar-1946 (73 y.o. Female) Treating RN: Montey Hora Primary Care Lorey Pallett: Harrel Lemon Other Clinician: Referring Blayne Garlick: Harrel Lemon Treating Bria Sparr/Extender: Melburn Hake, HOYT Weeks in Treatment: 0 Visit Information Patient Arrived: Walker Arrival Time: 09:49 Accompanied By: husband Transfer Assistance: None Patient Identification Verified: Yes Secondary Verification Process Completed: Yes Electronic Signature(s) Signed: 06/20/2019 3:59:19 PM By: Lorine Bears RCP, RRT, CHT Entered By: Lorine Bears on 06/20/2019 09:50:22 [name] ([id number]) -------------------------------------------------------------------------------- Clinic Level of Care Assessment Details Patient Name: [name]. Date of Service: 06/20/2019 9:45 AM Medical Record Number: [id number] Patient Account Number: [id number] Date of Birth/Sex: 1945-11-08 (73 y.o. Female) Treating RN: Montey Hora Primary Care Careen Mauch: Harrel Lemon Other  Clinician: Referring Dajsha Massaro: Harrel Lemon Treating Samson Ralph/Extender: Melburn Hake, HOYT Weeks in Treatment: 0 Clinic Level of Care Assessment Items TOOL 2 Quantity Score []  - Use when only an EandM is performed on the INITIAL visit 0 ASSESSMENTS - Nursing Assessment / Reassessment X - General Physical Exam (combine w/ comprehensive assessment (listed just below) when 1 20 performed on new pt. evals) X- 1 25 Comprehensive Assessment (HX, ROS, Risk Assessments, Wounds Hx, etc.) ASSESSMENTS - Wound and Skin Assessment / Reassessment X - Simple Wound Assessment / Reassessment - one wound 1 5 []  - 0 Complex Wound Assessment / Reassessment - multiple wounds []  - 0 Dermatologic / Skin Assessment (not related to wound area) ASSESSMENTS - Ostomy and/or Continence Assessment and Care []  - Incontinence Assessment and Management 0 []  - 0 Ostomy Care Assessment and Management (repouching, etc.) PROCESS - Coordination of Care X - Simple Patient / Family Education for ongoing care 1 15 []  - 0 Complex (extensive) Patient / Family Education for ongoing care X- 1 10 Staff obtains Programmer, systems, Records, Test Results / Process Orders []  - 0 Staff telephones HHA, Nursing Homes / Clarify orders / etc []  - 0 Routine Transfer to another Facility (non-emergent condition) []  - 0 Routine Hospital Admission (non-emergent condition) X- 1 15 New Admissions / Biomedical engineer / Ordering NPWT, Apligraf, etc. []  - 0 Emergency Hospital Admission (emergent condition) X- 1 10 Simple Discharge Coordination []  - 0 Complex (extensive) Discharge Coordination PROCESS - Special Needs []  - Pediatric / Minor Patient Management 0 []  - 0 Isolation Patient Management [name], [name] ([id number]) []  - 0 Hearing / Language / Visual special needs []  - 0 Assessment of Community assistance (transportation, D/C planning, etc.) []  - 0 Additional assistance / Altered mentation []  - 0 Support Surface(s)  Assessment (bed, cushion, seat, etc.) INTERVENTIONS - Wound Cleansing / Measurement X - Wound Imaging (photographs - any number of wounds) 1 5 []  - 0 Wound Tracing (instead of photographs) X- 1 5 Simple Wound Measurement - one wound []  - 0 Complex Wound Measurement - multiple wounds X- 1 5 Simple Wound Cleansing - one  wound []  - 0 Complex Wound Cleansing - multiple wounds INTERVENTIONS - Wound Dressings X - Small Wound Dressing one or multiple wounds 1 10 []  - 0 Medium Wound Dressing one or multiple wounds []  - 0 Large Wound Dressing one or multiple wounds []  - 0 Application of Medications - injection INTERVENTIONS - Miscellaneous []  - External ear exam 0 []  - 0 Specimen Collection (cultures, biopsies, blood, body fluids, etc.) []  - 0 Specimen(s) / Culture(s) sent or taken to Lab for analysis []  - 0 Patient Transfer (multiple staff / Civil Service fast streamer / Similar devices) []  - 0 Simple Staple / Suture removal (25 or less) []  - 0 Complex Staple / Suture removal (26 or more) []  - 0 Hypo / Hyperglycemic Management (close monitor of Blood Glucose) []  - 0 Ankle / Brachial Index (ABI) - do not check if billed separately Has the patient been seen at the hospital within the last three years: Yes Total Score: 125 Level Of Care: New/Established - Level 4 Electronic Signature(s) Signed: 06/20/2019 4:48:17 PM By: Montey Hora Entered By: Montey Hora on 06/20/2019 10:44:14 [name] ([id number]) -------------------------------------------------------------------------------- Encounter Discharge Information Details Patient Name: Breanna Cleverly H. Date of Service: 06/20/2019 9:45 AM Medical Record Number: [id number] Patient Account Number: [id number] Date of Birth/Sex: [birth year] (73 y.o. Female) Treating RN: Cornell Barman Primary Care Michaeline Eckersley: Harrel Lemon Other Clinician: Referring Shay Jhaveri: Harrel Lemon Treating Nakeya Adinolfi/Extender: Melburn Hake, HOYT Weeks in Treatment:  0 Encounter Discharge Information Items Discharge Condition: Stable Ambulatory Status: Ambulatory Discharge Destination: Home Transportation: Private Auto Accompanied By: self Schedule Follow-up Appointment: Yes Clinical Summary of Care: Electronic Signature(s) Signed: 06/20/2019 10:43:00 AM By: Gretta Cool, BSN, RN, CWS, Kim RN, BSN Entered By: Gretta Cool, BSN, RN, CWS, Kim on 06/20/2019 10:43:00 Zabinski, [name] ([id number]) -------------------------------------------------------------------------------- Lower Extremity Assessment Details Patient Name: [name], Breanna Look H. Date of Service: 06/20/2019 9:45 AM Medical Record Number: [id number] Patient Account Number: [id number] Date of Birth/Sex: May 16, 1946 (73 y.o. Female) Treating RN: Harold Barban Primary Care Yailyn Strack: Harrel Lemon Other Clinician: Referring Wylie Russon: Harrel Lemon Treating Cathy Ropp/Extender: Melburn Hake, HOYT Weeks in Treatment: 0 Notes Abdominal wound Electronic Signature(s) Signed: 06/20/2019 4:09:28 PM By: Harold Barban Entered By: Harold Barban on 06/20/2019 10:08:03 Pequignot, [name] ([id number]) -------------------------------------------------------------------------------- Multi Wound Chart Details Patient Name: Breanna Cleverly H. Date of Service: 06/20/2019 9:45 AM Medical Record Number: [id number] Patient Account Number: [id number] Date of Birth/Sex: 12-10-1945 (73 y.o. Female) Treating RN: Montey Hora Primary Care Kayann Maj: Harrel Lemon Other Clinician: Referring Goldia Ligman: Harrel Lemon Treating Toniesha Zellner/Extender: Melburn Hake, HOYT Weeks in Treatment: 0 Vital Signs Height(in): 56 Pulse(bpm): 57 Weight(lbs): 93 Blood Pressure(mmHg): 121/64 Body Mass Index(BMI): 42 Temperature(F): 98.9 Respiratory Rate 18 (breaths/min): Photos: [N/A:N/A] Wound Location: Right Abdomen - Lower N/A N/A Quadrant Wounding Event: Shear/Friction N/A N/A Primary Etiology: Abrasion N/A N/A Comorbid History: Anemia,  Asthma, Chronic N/A N/A Obstructive Pulmonary Disease (COPD), Hypertension, Osteoarthritis, Neuropathy Date Acquired: 05/14/2019 N/A N/A Weeks of Treatment: 0 N/A N/A Wound Status: Open N/A N/A Measurements L x W x D 1.5x3x0.1 N/A N/A (cm) Area (cm) : 3.534 N/A N/A Volume (cm) : 0.353 N/A N/A % Reduction in Area: 0.00% N/A N/A % Reduction in Volume: 0.00% N/A N/A Classification: Full Thickness Without N/A N/A Exposed Support Structures Exudate Amount: Medium N/A N/A Exudate Type: Serous N/A N/A Exudate Color: amber N/A N/A Wound Margin: Flat and Intact N/A N/A Granulation Amount: Small (1-33%) N/A N/A Granulation Quality: Pink N/A N/A Necrotic Amount: Medium (34-66%) N/A N/A Exposed Structures: Fat Layer (Subcutaneous N/A N/A  Tissue) Exposed: Yes Hoffert, [place] ([id number]) Fascia: No Tendon: No Muscle: No Joint: No Bone: No Epithelialization: None N/A N/A Treatment Notes Electronic Signature(s) Signed: 06/20/2019 4:48:17 PM By: Montey Hora Entered By: Montey Hora on 06/20/2019 10:29:51 Ledyard, [name] ([id number]) -------------------------------------------------------------------------------- Multi-Disciplinary Care Plan Details Patient Name: [name]. Date of Service: 06/20/2019 9:45 AM Medical Record Number: [id number] Patient Account Number: [id number] Date of Birth/Sex: 1946-08-01 (73 y.o. Female) Treating RN: Montey Hora Primary Care Delenn Ahn: Harrel Lemon Other Clinician: Referring Kayana Thoen: Harrel Lemon Treating Konni Kesinger/Extender: Melburn Hake, HOYT Weeks in Treatment: 0 Active Inactive Abuse / Safety / Falls / Self Care Management Nursing Diagnoses: Potential for falls Goals: Patient will remain injury free related to falls Date Initiated: 06/20/2019 Target Resolution Date: 09/06/2019 Goal Status: Active Interventions: Assess fall risk on admission and as needed Notes: Orientation to the Wound Care Program Nursing Diagnoses: Knowledge  deficit related to the wound healing center program Goals: Patient/caregiver will verbalize understanding of the [facility] Date Initiated: 06/20/2019 Target Resolution Date: 09/06/2019 Goal Status: Active Interventions: Provide education on orientation to the wound center Notes: Wound/Skin Impairment Nursing Diagnoses: Impaired tissue integrity Goals: Ulcer/skin breakdown will heal within 14 weeks Date Initiated: 06/20/2019 Target Resolution Date: 09/06/2019 Goal Status: Active Interventions: Assess patient/caregiver ability to obtain necessary supplies [name], [name] ([id number]) Assess patient/caregiver ability to perform ulcer/skin care regimen upon admission and as needed Assess ulceration(s) every visit Notes: Electronic Signature(s) Signed: 06/20/2019 4:48:17 PM By: Montey Hora Entered By: Montey Hora on 06/20/2019 10:28:50 Philley, [name] ([id number]) -------------------------------------------------------------------------------- Pain Assessment Details Patient Name: [name]. Date of Service: 06/20/2019 9:45 AM Medical Record Number: [id number] Patient Account Number: [id number] Date of Birth/Sex: [birth year] (73 y.o. Female) Treating RN: Montey Hora Primary Care Cable Fearn: Harrel Lemon Other Clinician: Referring Mikey Maffett: Harrel Lemon Treating Nadean Montanaro/Extender: Melburn Hake, HOYT Weeks in Treatment: 0 Active Problems Location of Pain Severity and Description of Pain Patient Has Paino No Site Locations Pain Management and Medication Current Pain Management: Electronic Signature(s) Signed: 06/20/2019 3:59:19 PM By: Paulla Fore, RRT, CHT Signed: 06/20/2019 4:48:17 PM By: Montey Hora Entered By: Lorine Bears on 06/20/2019 09:50:37 Dejoseph, [name] ([id number]) -------------------------------------------------------------------------------- Patient/Caregiver Education Details Patient Name: [name]. Date of Service: 06/20/2019 9:45 AM Medical Record Number: [id number] Patient Account Number: [id number] Date of Birth/Gender: 06/29/1946 (74 y.o. Female) Treating RN: Montey Hora Primary Care Physician: Harrel Lemon Other Clinician: Referring Physician: Harrel Lemon Treating Physician/Extender: Sharalyn Ink in Treatment: 0 Education Assessment Education Provided To: Patient Education Topics Provided Basic Hygiene: Handouts: Other: panus skin care Methods: Explain/Verbal Responses: State content correctly Wound/Skin Impairment: Handouts: Other: wound care as ordered Methods: Demonstration, Explain/Verbal Responses: State content correctly Electronic Signature(s) Signed: 06/20/2019 4:48:17 PM By: Montey Hora Entered By: Montey Hora on 06/20/2019 10:48:38 Havlicek, [name] ([id number]) -------------------------------------------------------------------------------- Wound Assessment Details Patient Name: Breanna Cleverly H. Date of Service: 06/20/2019 9:45 AM Medical Record Number: [id number] Patient Account Number: [id number] Date of Birth/Sex: 02/05/1946 (73 y.o. Female) Treating RN: Harold Barban Primary Care Nesta Scaturro: Harrel Lemon Other Clinician: Referring Jeannette Maddy: Harrel Lemon Treating Zarahi Fuerst/Extender: Melburn Hake, HOYT Weeks in Treatment: 0 Wound Status Wound Number: 1 Primary Abrasion Etiology: Wound Location: Right Abdomen - Lower Quadrant Wound Open Wounding Event: Shear/Friction Status: Date Acquired: 05/14/2019 Comorbid Anemia, Asthma, Chronic Obstructive Weeks Of Treatment: 0 History: Pulmonary Disease (COPD), Hypertension, Clustered Wound: No Osteoarthritis, Neuropathy Photos Wound Measurements Length: (cm) 1.5 Width: (cm) 3 Depth: (cm) 0.1 Area: (cm) 3.534  Volume: (cm) 0.353 % Reduction in Area: 0% % Reduction in Volume: 0% Epithelialization: None Tunneling: No Undermining: No Wound Description Full Thickness  Without Exposed Support Foul Odo Classification: Structures Slough/F Wound Margin: Flat and Intact Exudate Medium Amount: Exudate Type: Serous Exudate Color: amber r After Cleansing: No ibrino Yes Wound Bed Granulation Amount: Small (1-33%) Exposed Structure Granulation Quality: Pink Fascia Exposed: No Necrotic Amount: Medium (34-66%) Fat Layer (Subcutaneous Tissue) Exposed: Yes Necrotic Quality: Adherent Slough Tendon Exposed: No Muscle Exposed: No Joint Exposed: No Bone Exposed: No Guardino, Anabella HMarland Kitchen ([id number]) Electronic Signature(s) Signed: 06/20/2019 4:09:28 PM By: Harold Barban Entered By: Harold Barban on 06/20/2019 10:16:51 Eschbach, [name] ([id number]) -------------------------------------------------------------------------------- Vitals Details Patient Name: Breanna Cleverly H. Date of Service: 06/20/2019 9:45 AM Medical Record Number: [id number] Patient Account Number: [id number] Date of Birth/Sex: 1946-09-22 (73 y.o. Female) Treating RN: Montey Hora Primary Care Vonnetta Akey: Harrel Lemon Other Clinician: Referring Sherrice Creekmore: Harrel Lemon Treating Vieno Tarrant/Extender: Melburn Hake, HOYT Weeks in Treatment: 0 Vital Signs Time Taken: 09:50 Temperature (F): 98.9 Height (in): 58 Pulse (bpm): 70 Weight (lbs): 203 Respiratory Rate (breaths/min): 18 Source: Measured Blood Pressure (mmHg): 121/64 Body Mass Index (BMI): 42.4 Reference Range: 80 - 120 mg / dl Electronic Signature(s) Signed: 06/20/2019 3:59:19 PM By: Lorine Bears RCP, RRT, CHT Entered By: Lorine Bears on 06/20/2019 09:52:03

## 2019-06-27 ENCOUNTER — Other Ambulatory Visit: Payer: Self-pay

## 2019-06-27 ENCOUNTER — Encounter: Payer: Medicare Other | Admitting: Physician Assistant

## 2019-06-27 DIAGNOSIS — S31103A Unspecified open wound of abdominal wall, right lower quadrant without penetration into peritoneal cavity, initial encounter: Secondary | ICD-10-CM | POA: Diagnosis not present

## 2019-06-27 NOTE — Progress Notes (Signed)
ALEXSIA, [name] ([id number]) Visit Report for 06/27/2019 Arrival Information Details Patient Name: [name], [name]. Date of Service: 06/27/2019 10:15 AM Medical Record Number: [id number] Patient Account Number: [id number] Date of Birth/Sex: 13-Jun-1946 (73 y.o. F) Treating RN: Army Melia Primary Care Lambros Cerro: Harrel Lemon Other Clinician: Referring Tallulah Hosman: Harrel Lemon Treating Gidget Quizhpi/Extender: Melburn Hake, HOYT Weeks in Treatment: 1 Visit Information History Since Last Visit Added or deleted any medications: No Patient Arrived: Walker Any new allergies or adverse reactions: No Arrival Time: 10:01 Had a fall or experienced change in No Accompanied By: self activities of daily living that may affect Transfer Assistance: None risk of falls: Signs or symptoms of abuse/neglect since last visito No Hospitalized since last visit: No Has Dressing in Place as Prescribed: Yes Pain Present Now: No Electronic Signature(s) Signed: 06/27/2019 3:12:16 PM By: Army Melia Entered By: Army Melia on 06/27/2019 10:01:21 Ray, [name] ([id number]) -------------------------------------------------------------------------------- Clinic Level of Care Assessment Details Patient Name: [name]. Date of Service: 06/27/2019 10:15 AM Medical Record Number: [id number] Patient Account Number: [id number] Date of Birth/Sex: 1946-04-21 (73 y.o. F) Treating RN: Montey Hora Primary Care Natasja Niday: Harrel Lemon Other Clinician: Referring Cherokee Clowers: Harrel Lemon Treating Jannat Rosemeyer/Extender: Melburn Hake, HOYT Weeks in Treatment: 1 Clinic Level of Care Assessment Items TOOL 4 Quantity Score []  - Use when only an EandM is performed on FOLLOW-UP visit 0 ASSESSMENTS - Nursing Assessment / Reassessment X - Reassessment of Co-morbidities (includes updates in patient status) 1 10 X- 1 5 Reassessment of Adherence to Treatment Plan ASSESSMENTS - Wound and Skin Assessment / Reassessment X - Simple Wound  Assessment / Reassessment - one wound 1 5 []  - 0 Complex Wound Assessment / Reassessment - multiple wounds []  - 0 Dermatologic / Skin Assessment (not related to wound area) ASSESSMENTS - Focused Assessment []  - Circumferential Edema Measurements - multi extremities 0 []  - 0 Nutritional Assessment / Counseling / Intervention []  - 0 Lower Extremity Assessment (monofilament, tuning fork, pulses) []  - 0 Peripheral Arterial Disease Assessment (using hand held doppler) ASSESSMENTS - Ostomy and/or Continence Assessment and Care []  - Incontinence Assessment and Management 0 []  - 0 Ostomy Care Assessment and Management (repouching, etc.) PROCESS - Coordination of Care X - Simple Patient / Family Education for ongoing care 1 15 []  - 0 Complex (extensive) Patient / Family Education for ongoing care X- 1 10 Staff obtains Programmer, systems, Records, Test Results / Process Orders []  - 0 Staff telephones HHA, Nursing Homes / Clarify orders / etc []  - 0 Routine Transfer to another Facility (non-emergent condition) []  - 0 Routine Hospital Admission (non-emergent condition) []  - 0 New Admissions / Biomedical engineer / Ordering NPWT, Apligraf, etc. []  - 0 Emergency Hospital Admission (emergent condition) X- 1 10 Simple Discharge Coordination Erney, Morghan H. ([id number]) []  - 0 Complex (extensive) Discharge Coordination PROCESS - Special Needs []  - Pediatric / Minor Patient Management 0 []  - 0 Isolation Patient Management []  - 0 Hearing / Language / Visual special needs []  - 0 Assessment of Community assistance (transportation, D/C planning, etc.) []  - 0 Additional assistance / Altered mentation []  - 0 Support Surface(s) Assessment (bed, cushion, seat, etc.) INTERVENTIONS - Wound Cleansing / Measurement X - Simple Wound Cleansing - one wound 1 5 []  - 0 Complex Wound Cleansing - multiple wounds X- 1 5 Wound Imaging (photographs - any number of wounds) []  - 0 Wound Tracing (instead of  photographs) X- 1 5 Simple Wound Measurement - one wound []  - 0 Complex Wound  Measurement - multiple wounds INTERVENTIONS - Wound Dressings X - Small Wound Dressing one or multiple wounds 1 10 []  - 0 Medium Wound Dressing one or multiple wounds []  - 0 Large Wound Dressing one or multiple wounds X- 1 5 Application of Medications - topical []  - 0 Application of Medications - injection INTERVENTIONS - Miscellaneous []  - External ear exam 0 []  - 0 Specimen Collection (cultures, biopsies, blood, body fluids, etc.) []  - 0 Specimen(s) / Culture(s) sent or taken to Lab for analysis []  - 0 Patient Transfer (multiple staff / Civil Service fast streamer / Similar devices) []  - 0 Simple Staple / Suture removal (25 or less) []  - 0 Complex Staple / Suture removal (26 or more) []  - 0 Hypo / Hyperglycemic Management (close monitor of Blood Glucose) []  - 0 Ankle / Brachial Index (ABI) - do not check if billed separately X- 1 5 Vital Signs Crombie, Cordella H. ([id number]) Has the patient been seen at the hospital within the last three years: Yes Total Score: 90 Level Of Care: New/Established - Level 3 Electronic Signature(s) Signed: 06/27/2019 1:01:55 PM By: Montey Hora Entered By: Montey Hora on 06/27/2019 10:24:01 Lindfors, [name] ([id number]) -------------------------------------------------------------------------------- Encounter Discharge Information Details Patient Name: Breanna Cleverly H. Date of Service: 06/27/2019 10:15 AM Medical Record Number: [id number] Patient Account Number: [id number] Date of Birth/Sex: 1946-08-13 (73 y.o. F) Treating RN: Montey Hora Primary Care Jaiven Graveline: Harrel Lemon Other Clinician: Referring Braelynn Lupton: Harrel Lemon Treating Jassiah Viviano/Extender: Melburn Hake, HOYT Weeks in Treatment: 1 Encounter Discharge Information Items Discharge Condition: Stable Ambulatory Status: Walker Discharge Destination: Home Transportation: Private Auto Accompanied By: self Schedule  Follow-up Appointment: Yes Clinical Summary of Care: Electronic Signature(s) Signed: 06/27/2019 1:01:55 PM By: Montey Hora Entered By: Montey Hora on 06/27/2019 10:29:46 Hable, [name] ([id number]) -------------------------------------------------------------------------------- Lower Extremity Assessment Details Patient Name: Breanna Cleverly H. Date of Service: 06/27/2019 10:15 AM Medical Record Number: [id number] Patient Account Number: [id number] Date of Birth/Sex: 08-25-1946 (73 y.o. F) Treating RN: Army Melia Primary Care Barrett Goldie: Harrel Lemon Other Clinician: Referring Revan Gendron: Harrel Lemon Treating Keiasha Diep/Extender: Melburn Hake, HOYT Weeks in Treatment: 1 Electronic Signature(s) Signed: 06/27/2019 3:12:16 PM By: Army Melia Entered By: Army Melia on 06/27/2019 10:07:18 Wymore, [name] ([id number]) -------------------------------------------------------------------------------- Multi Wound Chart Details Patient Name: Breanna Cleverly H. Date of Service: 06/27/2019 10:15 AM Medical Record Number: [id number] Patient Account Number: [id number] Date of Birth/Sex: December 07, 1945 (73 y.o. F) Treating RN: Montey Hora Primary Care Presly Steinruck: Harrel Lemon Other Clinician: Referring Kazue Cerro: Harrel Lemon Treating Doris Gruhn/Extender: Melburn Hake, HOYT Weeks in Treatment: 1 Vital Signs Height(in): 56 Pulse(bpm): 72 Weight(lbs): 203 Blood Pressure(mmHg): 134/91 Body Mass Index(BMI): 42 Temperature(F): 98.5 Respiratory Rate 16 (breaths/min): Photos: [N/A:N/A] Wound Location: Right Abdomen - Lower N/A N/A Quadrant Wounding Event: Gradually Appeared N/A N/A Primary Etiology: MASD N/A N/A Comorbid History: Anemia, Asthma, Chronic N/A N/A Obstructive Pulmonary Disease (COPD), Hypertension, Osteoarthritis, Neuropathy Date Acquired: 05/14/2019 N/A N/A Weeks of Treatment: 1 N/A N/A Wound Status: Open N/A N/A Measurements L x W x D 1.1x3.5x0.1 N/A N/A (cm) Area (cm) :  3.024 N/A N/A Volume (cm) : 0.302 N/A N/A % Reduction in Area: 14.40% N/A N/A % Reduction in Volume: 14.40% N/A N/A Classification: Full Thickness Without N/A N/A Exposed Support Structures Exudate Amount: Medium N/A N/A Exudate Type: Serous N/A N/A Exudate Color: amber N/A N/A Wound Margin: Flat and Intact N/A N/A Granulation Amount: Small (1-33%) N/A N/A Granulation Quality: Pink N/A N/A Necrotic Amount: Medium (34-66%) N/A N/A Exposed Structures: Fat Layer (Subcutaneous N/A  N/A Tissue) Exposed: Yes Huntoon, Cash ([id number]) Fascia: No Tendon: No Muscle: No Joint: No Bone: No Epithelialization: None N/A N/A Treatment Notes Electronic Signature(s) Signed: 06/27/2019 1:01:55 PM By: Montey Hora Entered By: Montey Hora on 06/27/2019 10:22:21 Reis, [name] ([id number]) -------------------------------------------------------------------------------- Multi-Disciplinary Care Plan Details Patient Name: [name]. Date of Service: 06/27/2019 10:15 AM Medical Record Number: [id number] Patient Account Number: [id number] Date of Birth/Sex: July 31, 1946 (73 y.o. F) Treating RN: Montey Hora Primary Care Kameryn Tisdel: Harrel Lemon Other Clinician: Referring Jaesean Litzau: Harrel Lemon Treating Kora Groom/Extender: Melburn Hake, HOYT Weeks in Treatment: 1 Active Inactive Abuse / Safety / Falls / Self Care Management Nursing Diagnoses: Potential for falls Goals: Patient will remain injury free related to falls Date Initiated: 06/20/2019 Target Resolution Date: 09/06/2019 Goal Status: Active Interventions: Assess fall risk on admission and as needed Notes: Orientation to the Wound Care Program Nursing Diagnoses: Knowledge deficit related to the wound healing center program Goals: Patient/caregiver will verbalize understanding of the [facility] Program Date Initiated: 06/20/2019 Target Resolution Date: 09/06/2019 Goal Status: Active Interventions: Provide education  on orientation to the wound center Notes: Wound/Skin Impairment Nursing Diagnoses: Impaired tissue integrity Goals: Ulcer/skin breakdown will heal within 14 weeks Date Initiated: 06/20/2019 Target Resolution Date: 09/06/2019 Goal Status: Active Interventions: Assess patient/caregiver ability to obtain necessary supplies Akhter, Marlie H. ([id number]) Assess patient/caregiver ability to perform ulcer/skin care regimen upon admission and as needed Assess ulceration(s) every visit Notes: Electronic Signature(s) Signed: 06/27/2019 1:01:55 PM By: Montey Hora Entered By: Montey Hora on 06/27/2019 10:22:13 Vanover, [name] ([id number]) -------------------------------------------------------------------------------- Pain Assessment Details Patient Name: [name]. Date of Service: 06/27/2019 10:15 AM Medical Record Number: [id number] Patient Account Number: [id number] Date of Birth/Sex: September 12, 1946 (73 y.o. F) Treating RN: Army Melia Primary Care Airik Goodlin: Harrel Lemon Other Clinician: Referring Ellen Goris: Harrel Lemon Treating Litha Lamartina/Extender: Melburn Hake, HOYT Weeks in Treatment: 1 Active Problems Location of Pain Severity and Description of Pain Patient Has Paino No Site Locations Pain Management and Medication Current Pain Management: Electronic Signature(s) Signed: 06/27/2019 3:12:16 PM By: Army Melia Entered By: Army Melia on 06/27/2019 10:01:38 Schermer, [name] ([id number]) -------------------------------------------------------------------------------- Patient/Caregiver Education Details Patient Name: [name]. Date of Service: 06/27/2019 10:15 AM Medical Record Number: [id number] Patient Account Number: [id number] Date of Birth/Gender: 06-25-1946 (73 y.o. F) Treating RN: Montey Hora Primary Care Physician: Harrel Lemon Other Clinician: Referring Physician: Harrel Lemon Treating Physician/Extender: Sharalyn Ink in Treatment: 1 Education  Assessment Education Provided To: Patient Education Topics Provided Wound/Skin Impairment: Handouts: Other: wound care as ordered Methods: Demonstration, Explain/Verbal Responses: State content correctly Electronic Signature(s) Signed: 06/27/2019 1:01:55 PM By: Montey Hora Entered By: Montey Hora on 06/27/2019 10:24:27 Xia, [name] ([id number]) -------------------------------------------------------------------------------- Wound Assessment Details Patient Name: Breanna Cleverly H. Date of Service: 06/27/2019 10:15 AM Medical Record Number: [id number] Patient Account Number: [id number] Date of Birth/Sex: 05/11/1946 (73 y.o. F) Treating RN: Army Melia Primary Care Andersen Iorio: Harrel Lemon Other Clinician: Referring Betsy Rosello: Harrel Lemon Treating Tandy Lewin/Extender: Melburn Hake, HOYT Weeks in Treatment: 1 Wound Status Wound Number: 1 Primary MASD Etiology: Wound Location: Right Abdomen - Lower Quadrant Wound Open Wounding Event: Gradually Appeared Status: Date Acquired: 05/14/2019 Comorbid Anemia, Asthma, Chronic Obstructive Weeks Of Treatment: 1 History: Pulmonary Disease (COPD), Hypertension, Clustered Wound: No Osteoarthritis, Neuropathy Photos Wound Measurements Length: (cm) 1.1 Width: (cm) 3.5 Depth: (cm) 0.1 Area: (cm) 3.024 Volume: (cm) 0.302 % Reduction in Area: 14.4% % Reduction in Volume: 14.4% Epithelialization: None Tunneling: No Undermining: No Wound Description Full Thickness  Without Exposed Support Foul Od Classification: Structures Slough/ Wound Margin: Flat and Intact Exudate Medium Amount: Exudate Type: Serous Exudate Color: amber or After Cleansing: No Fibrino Yes Wound Bed Granulation Amount: Small (1-33%) Exposed Structure Granulation Quality: Pink Fascia Exposed: No Necrotic Amount: Medium (34-66%) Fat Layer (Subcutaneous Tissue) Exposed: Yes Necrotic Quality: Adherent Slough Tendon Exposed: No Muscle Exposed: No Joint  Exposed: No Bone Exposed: No Barre, Kaniyah H. ([id number]) Treatment Notes Wound #1 (Right Abdomen - Lower Quadrant) Notes nystatin powder and plain alginate Electronic Signature(s) Signed: 06/27/2019 3:12:16 PM By: Army Melia Entered By: Army Melia on 06/27/2019 10:06:57 Dicicco, [name] ([id number]) -------------------------------------------------------------------------------- Vitals Details Patient Name: Breanna Cleverly H. Date of Service: 06/27/2019 10:15 AM Medical Record Number: [id number] Patient Account Number: [id number] Date of Birth/Sex: 02-Jun-1946 (73 y.o. F) Treating RN: Army Melia Primary Care Nayomi Tabron: Harrel Lemon Other Clinician: Referring Midas Daughety: Harrel Lemon Treating Maylin Freeburg/Extender: Melburn Hake, HOYT Weeks in Treatment: 1 Vital Signs Time Taken: 10:01 Temperature (F): 98.5 Height (in): 58 Pulse (bpm): 72 Weight (lbs): 203 Respiratory Rate (breaths/min): 16 Body Mass Index (BMI): 42.4 Blood Pressure (mmHg): 134/91 Reference Range: 80 - 120 mg / dl Electronic Signature(s) Signed: 06/27/2019 3:12:16 PM By: Army Melia Entered By: Army Melia on 06/27/2019 10:02:28

## 2019-06-27 NOTE — Progress Notes (Addendum)
TAELYN, BROECKER ([id number]) Visit Report for 06/27/2019 Chief Complaint Document Details Patient Name: [name], [name]. Date of Service: 06/27/2019 10:15 AM Medical Record Number: [id number] Patient Account Number: [id number] Date of Birth/Sex: [birth year] (73 y.o. F) Treating RN: Montey Hora Primary Care Provider: Harrel Lemon Other Clinician: Referring Provider: Harrel Lemon Treating Provider/Extender: Melburn Hake, Traxton Kolenda Weeks in Treatment: 1 Information Obtained from: Patient Chief Complaint Right Lower Quadrant Abdominal Ulcer Electronic Signature(s) Signed: 06/27/2019 10:09:11 AM By: [name] PA-C Entered By: [name] on 06/27/2019 10:09:11 Traughber, [name] ([id number]) -------------------------------------------------------------------------------- HPI Details Patient Name: [name]. Date of Service: 06/27/2019 10:15 AM Medical Record Number: [id number] Patient Account Number: [id number] Date of Birth/Sex: June 08, 1946 (73 y.o. F) Treating RN: Montey Hora Primary Care Provider: Harrel Lemon Other Clinician: Referring Provider: Harrel Lemon Treating Provider/Extender: Melburn Hake, Landan Fedie Weeks in Treatment: 1 History of Present Illness HPI Description: 06/20/2019 on evaluation today patient presents for initial inspection here in our office concerning issue that has been going on for the past several weeks with her moisture associated breakdown in the right lower quadrant of the abdominal region underneath her pannus. Subsequently she is not having any pain which is at least good news. Her primary care provider did give her a prescription for nystatin powder as well as doxycycline this ends on the 28th as far as doxycycline is concerned that is just 1 week away. Nonetheless I do not see any evidence of active infection at this time which is good news. With that being said the wound itself does appear to be in a very precarious location right at the crease which  will make it more difficult to heal secondary to moisture issues. Nonetheless I think that potentially an alginate dressing could be of benefit here. No fevers, chills, nausea, vomiting, or diarrhea. She does have a history of COPD, hypertension, obesity, and more recently in this area a tinea corporis infection. 06/27/2019 on evaluation today patient actually appears to be doing I believe okay with regard to her wound. There is much less irritation today compared to the last visit I feel like the nystatin powder has helped in that regard also feel like she is much less macerated than previously noted. With that being said there is a slight hint of new skin growing around the edges of the wound which is good news obviously this is just can take some time. Electronic Signature(s) Signed: 06/27/2019 10:26:03 AM By: [name] PA-C Entered By: [name] on 06/27/2019 10:26:03 [name], [name] ([id number]) -------------------------------------------------------------------------------- Physical Exam Details Patient Name: [name] H. Date of Service: 06/27/2019 10:15 AM Medical Record Number: [id number] Patient Account Number: [id number] Date of Birth/Sex: [birth year] (73 y.o. F) Treating RN: Montey Hora Primary Care Provider: Harrel Lemon Other Clinician: Referring Provider: Harrel Lemon Treating Provider/Extender: Melburn Hake, Jirah Rider Weeks in Treatment: 1 Constitutional Obese and well-hydrated in no acute distress. Respiratory normal breathing without difficulty. clear to auscultation bilaterally. Cardiovascular regular rate and rhythm with normal S1, S2. Psychiatric this patient is able to make decisions and demonstrates good insight into disease process. Alert and Oriented x 3. pleasant and cooperative. Notes Patient's wound bed currently showed signs of good granulation at this time. There does not appear to be any evidence of active infection at this time which is also  good news. She does have evidence of still an open wound right in the crease which again I think is going to take quite a bit of time to  get this to truly and completely heal but nonetheless we will keep working towards that hand and I do believe that the silver cell is doing well for her. Electronic Signature(s) Signed: 06/27/2019 10:26:52 AM By: [name] PA-C Entered By: [name] on 06/27/2019 10:26:52 [name], [name] ([id number]) -------------------------------------------------------------------------------- Physician Orders Details Patient Name: [name]. Date of Service: 06/27/2019 10:15 AM Medical Record Number: [id number] Patient Account Number: [id number] Date of Birth/Sex: September 04, 1946 (73 y.o. F) Treating RN: Montey Hora Primary Care Provider: Harrel Lemon Other Clinician: Referring Provider: Harrel Lemon Treating Provider/Extender: Melburn Hake, Muaz Shorey Weeks in Treatment: 1 Verbal / Phone Orders: No Diagnosis Coding ICD-10 Coding Code Description L98.9 Disorder of the skin and subcutaneous tissue, unspecified B35.4 Tinea corporis Unspecified open wound of abdominal wall, right lower quadrant without penetration into peritoneal S31.103A cavity, initial encounter I10 Essential (primary) hypertension E66.01 Morbid (severe) obesity due to excess calories J44.9 Chronic obstructive pulmonary disease, unspecified Wound Cleansing Wound #1 Right Abdomen - Lower Quadrant o Dial antibacterial soap, wash wounds, rinse and pat dry prior to dressing wounds o May Shower, gently pat wound dry prior to applying new dressing. Primary Wound Dressing Wound #1 Right Abdomen - Lower Quadrant o Other: - nystatin powder Secondary Dressing o Alginate - [facility] to provide calcium alginate for patient to use Dressing Change Frequency Wound #1 Right Abdomen - Lower Quadrant o Change dressing every day. Follow-up Appointments Wound #1 Right Abdomen - Lower  Quadrant o Return Appointment in 1 week. Home Health Wound #1 Right Abdomen - [place] Visits - [facility] - [facility] to visit patient weekly for wound care and provide calcium alginate for patient o Home Health Nurse may visit PRN to address patientos wound care needs. o FACE TO FACE ENCOUNTER: MEDICARE and MEDICAID PATIENTS: I certify that this patient is under my care and that I had a face-to-face encounter that meets the physician face-to-face encounter requirements with this patient on this date. The encounter with the patient was in whole or in part for the following MEDICAL CONDITION: (primary reason for [place]) MEDICAL NECESSITY: I certify, that based on my findings, NURSING services are a medically necessary home health service. HOME BOUND STATUS: I certify that my clinical findings support that this patient is homebound (i.e., Due to illness or injury, pt requires aid of supportive devices such as crutches, cane, wheelchairs, walkers, the use of special transportation or the [place], [place]. ([id number]) assistance of another person to leave their place of residence. There is a normal inability to leave the home and doing so requires considerable and taxing effort. Other absences are for medical reasons / religious services and are infrequent or of short duration when for other reasons). o If current dressing causes regression in wound condition, may D/C ordered dressing product/s and apply Normal Saline Moist Dressing daily until next [place] / Other MD appointment. [place] of regression in wound condition at [phone]. o Please direct any NON-WOUND related issues/requests for orders to patient's Primary Care Physician Medications-please add to medication list. Wound #1 Right Abdomen - Lower Quadrant o Other: - nystatin powder Electronic Signature(s) Signed: 06/27/2019 1:01:55 PM By: Montey Hora Signed: 06/29/2019 6:59:38 PM By: [name] PA-C Entered By: Montey Hora on 06/27/2019 10:23:32 [name], [name] ([id number]) -------------------------------------------------------------------------------- Problem List Details Patient Name: [name] H. Date of Service: 06/27/2019 10:15 AM Medical Record Number: [id number] Patient Account Number: [id number] Date of Birth/Sex: 02/17/1946 (  73 y.o. F) Treating RN: Montey Hora Primary Care Provider: Harrel Lemon Other Clinician: Referring Provider: Harrel Lemon Treating Provider/Extender: Melburn Hake, Sharetta Ricchio Weeks in Treatment: 1 Active Problems ICD-10 Evaluated Encounter Code Description Active Date Today Diagnosis L98.9 Disorder of the skin and subcutaneous tissue, unspecified 06/20/2019 No Yes B35.4 Tinea corporis 06/20/2019 No Yes S31.103A Unspecified open wound of abdominal wall, right lower 06/20/2019 No Yes quadrant without penetration into peritoneal cavity, initial encounter I10 Essential (primary) hypertension 06/20/2019 No Yes E66.01 Morbid (severe) obesity due to excess calories 06/20/2019 No Yes J44.9 Chronic obstructive pulmonary disease, unspecified 06/20/2019 No Yes Inactive Problems Resolved Problems Electronic Signature(s) Signed: 06/27/2019 10:09:02 AM By: [name] PA-C Entered By: [name] on 06/27/2019 10:09:02 [name], [name] ([id number]) -------------------------------------------------------------------------------- Progress Note Details Patient Name: [name]. Date of Service: 06/27/2019 10:15 AM Medical Record Number: [id number] Patient Account Number: [id number] Date of Birth/Sex: 1946-01-12 (73 y.o. F) Treating RN: Montey Hora Primary Care Provider: Harrel Lemon Other Clinician: Referring Provider: Harrel Lemon Treating Provider/Extender: Melburn Hake, Chenell Lozon Weeks in Treatment: 1 Subjective Chief Complaint Information obtained from Patient Right Lower Quadrant  Abdominal Ulcer History of Present Illness (HPI) 06/20/2019 on evaluation today patient presents for initial inspection here in our office concerning issue that has been going on for the past several weeks with her moisture associated breakdown in the right lower quadrant of the abdominal region underneath her pannus. Subsequently she is not having any pain which is at least good news. Her primary care provider did give her a prescription for nystatin powder as well as doxycycline this ends on the 28th as far as doxycycline is concerned that is just 1 week away. Nonetheless I do not see any evidence of active infection at this time which is good news. With that being said the wound itself does appear to be in a very precarious location right at the crease which will make it more difficult to heal secondary to moisture issues. Nonetheless I think that potentially an alginate dressing could be of benefit here. No fevers, chills, nausea, vomiting, or diarrhea. She does have a history of COPD, hypertension, obesity, and more recently in this area a tinea corporis infection. 06/27/2019 on evaluation today patient actually appears to be doing I believe okay with regard to her wound. There is much less irritation today compared to the last visit I feel like the nystatin powder has helped in that regard also feel like she is much less macerated than previously noted. With that being said there is a slight hint of new skin growing around the edges of the wound which is good news obviously this is just can take some time. Patient History Information obtained from Patient. Family History Cancer - Child, Diabetes - Mother, Heart Disease - Child, Hypertension - Child, Thyroid Problems - Child, No family history of Hereditary Spherocytosis, Kidney Disease, Lung Disease, Seizures, Stroke, Tuberculosis. Social History Former smoker - 40 years - ended on 05/30/2016, Marital Status - Married, Alcohol Use - Never,  Drug Use - No History, Caffeine Use - Daily - soda. Medical History Eyes Denies history of Cataracts, Glaucoma, Optic Neuritis Hematologic/Lymphatic Patient has history of Anemia Denies history of Hemophilia, Human Immunodeficiency Virus, Lymphedema, Sickle Cell Disease Respiratory Patient has history of Asthma, Chronic Obstructive Pulmonary Disease (COPD) Denies history of Pneumothorax, Sleep Apnea, Tuberculosis Cardiovascular Patient has history of Hypertension Denies history of Angina, Arrhythmia, Congestive Heart Failure, Coronary Artery Disease, Deep Vein Thrombosis, Hypotension, Myocardial Infarction, Peripheral Arterial Disease,  Peripheral Venous Disease, Phlebitis, Vasculitis Gastrointestinal Barley, Shiri H. ([id number]) Denies history of Cirrhosis , Colitis, Crohn s, Hepatitis A, Hepatitis B, Hepatitis C Endocrine Denies history of Type I Diabetes, Type II Diabetes Genitourinary Denies history of End Stage Renal Disease Immunological Denies history of Lupus Erythematosus, Raynaud s, Scleroderma Integumentary (Skin) Denies history of History of Burn, History of pressure wounds Musculoskeletal Patient has history of Osteoarthritis Denies history of Gout, Rheumatoid Arthritis, Osteomyelitis Neurologic Patient has history of Neuropathy Denies history of Dementia, Quadriplegia, Paraplegia, Seizure Disorder Psychiatric Denies history of Anorexia/bulimia, Confinement Anxiety Review of Systems (ROS) Constitutional Symptoms (General Health) Denies complaints or symptoms of Fatigue, Fever, Chills, Marked Weight Change. Respiratory Denies complaints or symptoms of Chronic or frequent coughs, Shortness of Breath. Cardiovascular Denies complaints or symptoms of Chest pain, LE edema. Psychiatric Denies complaints or symptoms of Anxiety, Claustrophobia. Objective Constitutional Obese and well-hydrated in no acute distress. Vitals Time Taken: 10:01 AM, Height: 58 in, Weight: 203  lbs, BMI: 42.4, Temperature: 98.5 F, Pulse: 72 bpm, Respiratory Rate: 16 breaths/min, Blood Pressure: 134/91 mmHg. Respiratory normal breathing without difficulty. clear to auscultation bilaterally. Cardiovascular regular rate and rhythm with normal S1, S2. Psychiatric this patient is able to make decisions and demonstrates good insight into disease process. Alert and Oriented x 3. pleasant and cooperative. General Notes: Patient's wound bed currently showed signs of good granulation at this time. There does not appear to be any evidence of active infection at this time which is also good news. She does have evidence of still an open wound right in the crease which again I think is going to take quite a bit of time to get this to truly and completely heal but nonetheless we will [name], [name] ([id number]) keep working towards that hand and I do believe that the silver cell is doing well for her. Integumentary (Hair, Skin) Wound #1 status is Open. Original cause of wound was Gradually Appeared. The wound is located on the Right Abdomen - Lower Quadrant. The wound measures 1.1cm length x 3.5cm width x 0.1cm depth; 3.024cm^2 area and 0.302cm^3 volume. There is Fat Layer (Subcutaneous Tissue) Exposed exposed. There is no tunneling or undermining noted. There is a medium amount of serous drainage noted. The wound margin is flat and intact. There is small (1-33%) pink granulation within the wound bed. There is a medium (34-66%) amount of necrotic tissue within the wound bed including Adherent Slough. Assessment Active Problems ICD-10 Disorder of the skin and subcutaneous tissue, unspecified Tinea corporis Unspecified open wound of abdominal wall, right lower quadrant without penetration into peritoneal cavity, initial encounter Essential (primary) hypertension Morbid (severe) obesity due to excess calories Chronic obstructive pulmonary disease, unspecified Plan Wound Cleansing: Wound #1  Right Abdomen - Lower Quadrant: Dial antibacterial soap, wash wounds, rinse and pat dry prior to dressing wounds May Shower, gently pat wound dry prior to applying new dressing. Primary Wound Dressing: Wound #1 Right Abdomen - Lower Quadrant: Other: - nystatin powder Secondary Dressing: Alginate - [facility] to provide calcium alginate for patient to use Dressing Change Frequency: Wound #1 Right Abdomen - Lower Quadrant: Change dressing every day. Follow-up Appointments: Wound #1 Right Abdomen - Lower Quadrant: Return Appointment in 1 week. Home Health: Wound #1 Right Abdomen - Lower Quadrant: [place] to visit patient weekly for wound care and provide calcium alginate for patient Home Health Nurse may visit PRN to address patient s wound care needs. FACE TO FACE ENCOUNTER: MEDICARE  and MEDICAID PATIENTS: I certify that this patient is under my care and that I had a face-to-face encounter that meets the physician face-to-face encounter requirements with this patient on this date. The encounter with the patient was in whole or in part for the following MEDICAL CONDITION: (primary reason for [place]) MEDICAL NECESSITY: I certify, that based on my findings, NURSING services are a medically necessary home health service. HOME BOUND STATUS: I certify that my clinical findings support that this patient is homebound (i.e., Due to illness or injury, pt requires aid of supportive devices such as crutches, cane, wheelchairs, walkers, the use of special transportation or the assistance of another person to leave their place of residence. There is a normal inability to leave the [name], Breanna [place]. ([id number]) home and doing so requires considerable and taxing effort. Other absences are for medical reasons / religious services and are infrequent or of short duration when for other reasons). If current dressing causes regression in wound condition, may D/C ordered  dressing product/s and apply Normal Saline Moist Dressing daily until next [place] / Other MD appointment. [place] of regression in wound condition at [phone]. Please direct any NON-WOUND related issues/requests for orders to patient's Primary Care Physician Medications-please add to medication list.: Wound #1 Right Abdomen - Lower Quadrant: Other: - nystatin powder 1. I would recommend that we continue with the silver alginate dressing as I do feel like this is doing a good job for the patient. 2. I am also going to suggest we continue with the nystatin powder since that seems to be beneficial and I see much less irritation today compared to last visit. 3. I recommend as well that we go ahead and see her back for reevaluation in 1 week's time in order to see how things are progressing. I feel like there is some new skin growth around the edge but again is difficult to tell. The length of the wound seems to be less today which is excellent news I think that is encouraging. We will see patient back for reevaluation in 1 week here in the clinic. If anything worsens or changes patient will contact our office for additional recommendations. Electronic Signature(s) Signed: 06/27/2019 10:27:50 AM By: [name] PA-C Entered By: [name] on 06/27/2019 10:27:49 [name], [name] ([id number]) -------------------------------------------------------------------------------- ROS/PFSH Details Patient Name: [name]. Date of Service: 06/27/2019 10:15 AM Medical Record Number: [id number] Patient Account Number: [id number] Date of Birth/Sex: [birth year] (73 y.o. F) Treating RN: Montey Hora Primary Care Provider: Harrel Lemon Other Clinician: Referring Provider: Harrel Lemon Treating Provider/Extender: Melburn Hake, Qianna Clagett Weeks in Treatment: 1 Information Obtained From Patient Constitutional Symptoms (General Health) Complaints and  Symptoms: Negative for: Fatigue; Fever; Chills; Marked Weight Change Respiratory Complaints and Symptoms: Negative for: Chronic or frequent coughs; Shortness of Breath Medical History: Positive for: Asthma; Chronic Obstructive Pulmonary Disease (COPD) Negative for: Pneumothorax; Sleep Apnea; Tuberculosis Cardiovascular Complaints and Symptoms: Negative for: Chest pain; LE edema Medical History: Positive for: Hypertension Negative for: Angina; Arrhythmia; Congestive Heart Failure; Coronary Artery Disease; Deep Vein Thrombosis; Hypotension; Myocardial Infarction; Peripheral Arterial Disease; Peripheral Venous Disease; Phlebitis; Vasculitis Psychiatric Complaints and Symptoms: Negative for: Anxiety; Claustrophobia Medical History: Negative for: Anorexia/bulimia; Confinement Anxiety Eyes Medical History: Negative for: Cataracts; Glaucoma; Optic Neuritis Hematologic/Lymphatic Medical History: Positive for: Anemia Negative for: Hemophilia; Human Immunodeficiency Virus; Lymphedema; Sickle Cell Disease Gastrointestinal Medical History: Negative for: Cirrhosis ; Colitis; Crohnos; Hepatitis A; Hepatitis B; Hepatitis C [place], Breanna  Lemmie [name] ([id number]) Endocrine Medical History: Negative for: Type I Diabetes; Type II Diabetes Genitourinary Medical History: Negative for: End Stage Renal Disease Immunological Medical History: Negative for: Lupus Erythematosus; Raynaudos; Scleroderma Integumentary (Skin) Medical History: Negative for: History of Burn; History of pressure wounds Musculoskeletal Medical History: Positive for: Osteoarthritis Negative for: Gout; Rheumatoid Arthritis; Osteomyelitis Neurologic Medical History: Positive for: Neuropathy Negative for: Dementia; Quadriplegia; Paraplegia; Seizure Disorder Immunizations Pneumococcal Vaccine: Received Pneumococcal Vaccination: No Tetanus Vaccine: Last tetanus shot: 02/27/2018 Implantable Devices None Family and Social  History Cancer: Yes - Child; Diabetes: Yes - Mother; Heart Disease: Yes - Child; Hereditary Spherocytosis: No; Hypertension: Yes - Child; Kidney Disease: No; Lung Disease: No; Seizures: No; Stroke: No; Thyroid Problems: Yes - Child; Tuberculosis: No; Former smoker - 40 years - ended on 05/30/2016; Marital Status - Married; Alcohol Use: Never; Drug Use: No History; Caffeine Use: Daily - soda; Financial Concerns: No; Food, Clothing or Shelter Needs: No; Support System Lacking: No; Transportation Concerns: No Physician Affirmation I have reviewed and agree with the above information. Electronic Signature(s) Signed: 06/27/2019 1:01:55 PM By: Montey Hora Signed: 06/29/2019 6:59:38 PM By: [name] PA-C Entered By: [name] on 06/27/2019 10:26:20 Studzinski, [name] ([id number]) -------------------------------------------------------------------------------- SuperBill Details Patient Name: [name]. Date of Service: 06/27/2019 Medical Record Number: [id number] Patient Account Number: [id number] Date of Birth/Sex: [birth year] (73 y.o. F) Treating RN: Montey Hora Primary Care Provider: Harrel Lemon Other Clinician: Referring Provider: Harrel Lemon Treating Provider/Extender: Melburn Hake, Bruno Leach Weeks in Treatment: 1 Diagnosis Coding ICD-10 Codes Code Description L98.9 Disorder of the skin and subcutaneous tissue, unspecified B35.4 Tinea corporis Unspecified open wound of abdominal wall, right lower quadrant without penetration into peritoneal S31.103A cavity, initial encounter I10 Essential (primary) hypertension E66.01 Morbid (severe) obesity due to excess calories J44.9 Chronic obstructive pulmonary disease, unspecified Facility Procedures CPT4 Code: [id number] Description: 99213 - WOUND CARE VISIT-LEV 3 EST PT Modifier: Quantity: 1 Physician Procedures CPT4: Description Modifier Quantity Code [id number] 99214 - WC PHYS LEVEL 4 - EST PT 1 ICD-10 Diagnosis Description L98.9  Disorder of the skin and subcutaneous tissue, unspecified B35.4 Tinea corporis S31.103A Unspecified open wound of abdominal wall, right  lower quadrant without penetration into peritoneal cavity, initial encounter I10 Essential (primary) hypertension Electronic Signature(s) Signed: 06/27/2019 10:28:05 AM By: [name] PA-C Entered By: [name] on 06/27/2019 10:28:05

## 2019-07-04 ENCOUNTER — Encounter: Payer: Medicare Other | Attending: Physician Assistant | Admitting: Physician Assistant

## 2019-07-04 ENCOUNTER — Other Ambulatory Visit: Payer: Self-pay

## 2019-07-04 DIAGNOSIS — G629 Polyneuropathy, unspecified: Secondary | ICD-10-CM | POA: Diagnosis not present

## 2019-07-04 DIAGNOSIS — Z833 Family history of diabetes mellitus: Secondary | ICD-10-CM | POA: Diagnosis not present

## 2019-07-04 DIAGNOSIS — J449 Chronic obstructive pulmonary disease, unspecified: Secondary | ICD-10-CM | POA: Diagnosis not present

## 2019-07-04 DIAGNOSIS — L98499 Non-pressure chronic ulcer of skin of other sites with unspecified severity: Secondary | ICD-10-CM | POA: Diagnosis present

## 2019-07-04 DIAGNOSIS — B354 Tinea corporis: Secondary | ICD-10-CM | POA: Diagnosis not present

## 2019-07-04 DIAGNOSIS — I10 Essential (primary) hypertension: Secondary | ICD-10-CM | POA: Insufficient documentation

## 2019-07-04 DIAGNOSIS — Z8249 Family history of ischemic heart disease and other diseases of the circulatory system: Secondary | ICD-10-CM | POA: Insufficient documentation

## 2019-07-04 DIAGNOSIS — D649 Anemia, unspecified: Secondary | ICD-10-CM | POA: Insufficient documentation

## 2019-07-04 DIAGNOSIS — Z809 Family history of malignant neoplasm, unspecified: Secondary | ICD-10-CM | POA: Insufficient documentation

## 2019-07-04 DIAGNOSIS — M1990 Unspecified osteoarthritis, unspecified site: Secondary | ICD-10-CM | POA: Insufficient documentation

## 2019-07-04 DIAGNOSIS — Z87891 Personal history of nicotine dependence: Secondary | ICD-10-CM | POA: Insufficient documentation

## 2019-07-04 NOTE — Progress Notes (Addendum)
RODNISHA, BLOMGREN ([id number]) Visit Report for 07/04/2019 Chief Complaint Document Details Patient Name: [name], [name]. Date of Service: 07/04/2019 10:45 AM Medical Record Number: [id number] Patient Account Number: [id number] Date of Birth/Sex: Jul 24, 1946 (73 y.o. F) Treating RN: Montey Hora Primary Care Provider: Harrel Lemon Other Clinician: Referring Provider: Harrel Lemon Treating Provider/Extender: Melburn Hake, Mahaila Tischer Weeks in Treatment: 2 Information Obtained from: Patient Chief Complaint Right Lower Quadrant Abdominal Ulcer Electronic Signature(s) Signed: 07/04/2019 10:59:03 AM By: [name] PA-C Entered By: [name] on 07/04/2019 10:59:03 [place], [name] ([id number]) -------------------------------------------------------------------------------- HPI Details Patient Name: [name]. Date of Service: 07/04/2019 10:45 AM Medical Record Number: [id number] Patient Account Number: [id number] Date of Birth/Sex: May 22, 1946 (73 y.o. F) Treating RN: Montey Hora Primary Care Provider: Harrel Lemon Other Clinician: Referring Provider: Harrel Lemon Treating Provider/Extender: Melburn Hake, Greene Diodato Weeks in Treatment: 2 History of Present Illness HPI Description: 06/20/2019 on evaluation today patient presents for initial inspection here in our office concerning issue that has been going on for the past several weeks with her moisture associated breakdown in the right lower quadrant of the abdominal region underneath her pannus. Subsequently she is not having any pain which is at least good news. Her primary care provider did give her a prescription for nystatin powder as well as doxycycline this ends on the 28th as far as doxycycline is concerned that is just 1 week away. Nonetheless I do not see any evidence of active infection at this time which is good news. With that being said the wound itself does appear to be in a very precarious location right at the crease which will  make it more difficult to heal secondary to moisture issues. Nonetheless I think that potentially an alginate dressing could be of benefit here. No fevers, chills, nausea, vomiting, or diarrhea. She does have a history of COPD, hypertension, obesity, and more recently in this area a tinea corporis infection. 06/27/2019 on evaluation today patient actually appears to be doing I believe okay with regard to her wound. There is much less irritation today compared to the last visit I feel like the nystatin powder has helped in that regard also feel like she is much less macerated than previously noted. With that being said there is a slight hint of new skin growing around the edges of the wound which is good news obviously this is just can take some time. 07/04/19 on evaluation today patient appears to be doing well with regard to her right lower quadrant abdominal ulcer. She is making some progress albeit very slow. There does not appear to be any fungal/yeast infection at this time which is good news. With that being said there does appear to be slow epithelialization around the edge of the wound. The wound bed itself is not quite as healthy as I would like to see though no slough there just really is not a lot of good granulation tissue there is a lot of what appears to be scar tissue which obviously is much harder to get new skin to grow over. I think she may benefit from switching to [facility] dressing to see if this could be helpful in speeding up the healing process. Electronic Signature(s) Signed: 07/10/2019 9:55:22 AM By: Gretta Cool, BSN, RN, CWS, Kim RN, BSN Signed: 07/10/2019 11:27:42 AM By: [name] PA-C Previous Signature: 07/04/2019 6:38:23 PM Version By: [name] PA-C Entered By: Gretta Cool BSN, RN, CWS, Kim on 07/10/2019 09:55:21 [place], [name] ([id number]) -------------------------------------------------------------------------------- Physical  Exam Details Patient Name: [name],  [name]. Date of Service: 07/04/2019 10:45 AM Medical Record Number: [id number] Patient Account Number: [id number] Date of Birth/Sex: [birth year] (73 y.o. F) Treating RN: Montey Hora Primary Care Provider: Harrel Lemon Other Clinician: Referring Provider: Harrel Lemon Treating Provider/Extender: Melburn Hake, Shelden Raborn Weeks in Treatment: 2 Constitutional Well-nourished and well-hydrated in no acute distress. Respiratory normal breathing without difficulty. clear to auscultation bilaterally. Cardiovascular regular rate and rhythm with normal S1, S2. Psychiatric this patient is able to make decisions and demonstrates good insight into disease process. Alert and Oriented x 3. pleasant and cooperative. Notes Patient's wound bed currently did not require any sharp debridement which is great news. Fortunately there is no signs of active infection either at this time. No fevers, chills, nausea, vomiting, or diarrhea. Electronic Signature(s) Signed: 07/04/2019 6:44:11 PM By: [name] PA-C Entered By: [name] on 07/04/2019 18:44:10 [place], [name] ([id number]) -------------------------------------------------------------------------------- Physician Orders Details Patient Name: [name]. Date of Service: 07/04/2019 10:45 AM Medical Record Number: [id number] Patient Account Number: [id number] Date of Birth/Sex: 03/12/1946 (73 y.o. F) Treating RN: Montey Hora Primary Care Provider: Harrel Lemon Other Clinician: Referring Provider: Harrel Lemon Treating Provider/Extender: Melburn Hake, Jaidy Cottam Weeks in Treatment: 2 Verbal / Phone Orders: No Diagnosis Coding ICD-10 Coding Code Description L98.9 Disorder of the skin and subcutaneous tissue, unspecified B35.4 Tinea corporis Unspecified open wound of abdominal wall, right lower quadrant without penetration into peritoneal S31.103A cavity, initial encounter I10 Essential (primary) hypertension E66.01 Morbid (severe) obesity  due to excess calories J44.9 Chronic obstructive pulmonary disease, unspecified Wound Cleansing Wound #1 Right Abdomen - Lower Quadrant o Dial antibacterial soap, wash wounds, rinse and pat dry prior to dressing wounds o May Shower, gently pat wound dry prior to applying new dressing. Primary Wound Dressing Wound #1 Right Abdomen - Lower Quadrant o Hydrafera Blue Ready Transfer - tucked into the area Dressing Change Frequency Wound #1 Right Abdomen - Lower Quadrant o Change dressing every day. Follow-up Appointments Wound #1 Right Abdomen - Lower Quadrant o Return Appointment in 1 week. Home Health Wound #1 Right Abdomen - [place] [facility] to visit patient weekly for wound care o Home Health Nurse may visit PRN to address patientos wound care needs. o FACE TO FACE ENCOUNTER: MEDICARE and MEDICAID PATIENTS: I certify that this patient is under my care and that I had a face-to-face encounter that meets the physician face-to-face encounter requirements with this patient on this date. The encounter with the patient was in whole or in part for the following MEDICAL CONDITION: (primary reason for [place]) MEDICAL NECESSITY: I certify, that based on my findings, NURSING services are a medically necessary home health service. HOME BOUND STATUS: I certify that my clinical findings support that this patient is homebound (i.e., Due to illness or injury, pt requires aid of supportive devices such as crutches, cane, wheelchairs, walkers, the use of special transportation or the assistance of another person to leave their place of residence. There is a normal inability to leave the home and doing so requires considerable and taxing effort. Other absences are for medical reasons / religious services and are infrequent or of short duration when for other reasons). Mickler, KAYLEAN TUPOU ([id number]) o If current dressing causes regression  in wound condition, may D/C ordered dressing product/s and apply Normal Saline Moist Dressing daily until next [place] / Other MD appointment. [place] of regression  in wound condition at [phone]. o Please direct any NON-WOUND related issues/requests for orders to patient's Primary Care Physician Electronic Signature(s) Signed: 07/04/2019 4:03:55 PM By: Montey Hora Signed: 07/04/2019 6:57:28 PM By: [name] PA-C Entered By: Montey Hora on 07/04/2019 11:45:58 [place], [name] ([id number]) -------------------------------------------------------------------------------- Problem List Details Patient Name: Breanna Cleverly H. Date of Service: 07/04/2019 10:45 AM Medical Record Number: [id number] Patient Account Number: [id number] Date of Birth/Sex: 04/20/1946 (73 y.o. F) Treating RN: Montey Hora Primary Care Provider: Harrel Lemon Other Clinician: Referring Provider: Harrel Lemon Treating Provider/Extender: Melburn Hake, Keyvin Rison Weeks in Treatment: 2 Active Problems ICD-10 Evaluated Encounter Code Description Active Date Today Diagnosis L98.9 Disorder of the skin and subcutaneous tissue, unspecified 06/20/2019 No Yes B35.4 Tinea corporis 06/20/2019 No Yes S31.103A Unspecified open wound of abdominal wall, right lower 06/20/2019 No Yes quadrant without penetration into peritoneal cavity, initial encounter I10 Essential (primary) hypertension 06/20/2019 No Yes E66.01 Morbid (severe) obesity due to excess calories 06/20/2019 No Yes J44.9 Chronic obstructive pulmonary disease, unspecified 06/20/2019 No Yes Inactive Problems Resolved Problems Electronic Signature(s) Signed: 07/04/2019 10:58:57 AM By: [name] PA-C Entered By: [name] on 07/04/2019 10:58:57 [place], [name] ([id number]) -------------------------------------------------------------------------------- Progress Note Details Patient Name: [name]. Date of Service:  07/04/2019 10:45 AM Medical Record Number: [id number] Patient Account Number: [id number] Date of Birth/Sex: [birth year] (73 y.o. F) Treating RN: Montey Hora Primary Care Provider: Harrel Lemon Other Clinician: Referring Provider: Harrel Lemon Treating Provider/Extender: Melburn Hake, Kealani Leckey Weeks in Treatment: 2 Subjective Chief Complaint Information obtained from Patient Right Lower Quadrant Abdominal Ulcer History of Present Illness (HPI) 06/20/2019 on evaluation today patient presents for initial inspection here in our office concerning issue that has been going on for the past several weeks with her moisture associated breakdown in the right lower quadrant of the abdominal region underneath her pannus. Subsequently she is not having any pain which is at least good news. Her primary care provider did give her a prescription for nystatin powder as well as doxycycline this ends on the 28th as far as doxycycline is concerned that is just 1 week away. Nonetheless I do not see any evidence of active infection at this time which is good news. With that being said the wound itself does appear to be in a very precarious location right at the crease which will make it more difficult to heal secondary to moisture issues. Nonetheless I think that potentially an alginate dressing could be of benefit here. No fevers, chills, nausea, vomiting, or diarrhea. She does have a history of COPD, hypertension, obesity, and more recently in this area a tinea corporis infection. 06/27/2019 on evaluation today patient actually appears to be doing I believe okay with regard to her wound. There is much less irritation today compared to the last visit I feel like the nystatin powder has helped in that regard also feel like she is much less macerated than previously noted. With that being said there is a slight hint of new skin growing around the edges of the wound which is good news obviously this is just can take some  time. 07/04/19 on evaluation today patient appears to be doing well with regard to her right lower quadrant abdominal ulcer. She is making some progress albeit very slow. There does not appear to be any fungal/yeast infection at this time which is good news. With that being said there does appear to be slow epithelialization around the edge of the wound. The wound bed itself  is not quite as healthy as I would like to see though no slough there just really is not a lot of good granulation tissue there is a lot of what appears to be scar tissue which obviously is much harder to get new skin to grow over. I think she may benefit from switching to [facility] dressing to see if this could be helpful in speeding up the healing process. Patient History Information obtained from Patient. Family History Cancer - Child, Diabetes - Mother, Heart Disease - Child, Hypertension - Child, Thyroid Problems - Child, No family history of Hereditary Spherocytosis, Kidney Disease, Lung Disease, Seizures, Stroke, Tuberculosis. Social History Former smoker - 40 years - ended on 05/30/2016, Marital Status - Married, Alcohol Use - Never, Drug Use - No History, Caffeine Use - Daily - soda. Medical History Eyes Denies history of Cataracts, Glaucoma, Optic Neuritis Hematologic/Lymphatic Patient has history of Anemia Denies history of Hemophilia, Human Immunodeficiency Virus, Lymphedema, Sickle Cell Disease Respiratory [name], [name] ([id number]) Patient has history of Asthma, Chronic Obstructive Pulmonary Disease (COPD) Denies history of Pneumothorax, Sleep Apnea, Tuberculosis Cardiovascular Patient has history of Hypertension Denies history of Angina, Arrhythmia, Congestive Heart Failure, Coronary Artery Disease, Deep Vein Thrombosis, Hypotension, Myocardial Infarction, Peripheral Arterial Disease, Peripheral Venous Disease, Phlebitis, Vasculitis Gastrointestinal Denies history of Cirrhosis , Colitis, Crohn s,  Hepatitis A, Hepatitis B, Hepatitis C Endocrine Denies history of Type I Diabetes, Type II Diabetes Genitourinary Denies history of End Stage Renal Disease Immunological Denies history of Lupus Erythematosus, Raynaud s, Scleroderma Integumentary (Skin) Denies history of History of Burn, History of pressure wounds Musculoskeletal Patient has history of Osteoarthritis Denies history of Gout, Rheumatoid Arthritis, Osteomyelitis Neurologic Patient has history of Neuropathy Denies history of Dementia, Quadriplegia, Paraplegia, Seizure Disorder Psychiatric Denies history of Anorexia/bulimia, Confinement Anxiety Review of Systems (ROS) Constitutional Symptoms (General Health) Denies complaints or symptoms of Fatigue, Fever, Chills, Marked Weight Change. Respiratory Denies complaints or symptoms of Chronic or frequent coughs, Shortness of Breath. Cardiovascular Denies complaints or symptoms of Chest pain, LE edema. Psychiatric Denies complaints or symptoms of Anxiety, Claustrophobia. Objective Constitutional Well-nourished and well-hydrated in no acute distress. Vitals Time Taken: 11:00 AM, Height: 58 in, Weight: 203 lbs, BMI: 42.4, Temperature: 98.8 F, Pulse: 74 bpm, Respiratory Rate: 16 breaths/min, Blood Pressure: 105/56 mmHg. Respiratory normal breathing without difficulty. clear to auscultation bilaterally. Cardiovascular regular rate and rhythm with normal S1, S2. Psychiatric Burroughs, Lavonn H. ([id number]) this patient is able to make decisions and demonstrates good insight into disease process. Alert and Oriented x 3. pleasant and cooperative. General Notes: Patient's wound bed currently did not require any sharp debridement which is great news. Fortunately there is no signs of active infection either at this time. No fevers, chills, nausea, vomiting, or diarrhea. Integumentary (Hair, Skin) Wound #1 status is Open. Original cause of wound was Gradually Appeared. The wound is  located on the Right Abdomen - Lower Quadrant. The wound measures 0.9cm length x 3cm width x 0.1cm depth; 2.121cm^2 area and 0.212cm^3 volume. There is Fat Layer (Subcutaneous Tissue) Exposed exposed. There is no tunneling or undermining noted. There is a medium amount of serous drainage noted. The wound margin is flat and intact. There is medium (34-66%) pink granulation within the wound bed. There is a medium (34-66%) amount of necrotic tissue within the wound bed including Adherent Slough. Assessment Active Problems ICD-10 Disorder of the skin and subcutaneous tissue, unspecified Tinea corporis Unspecified open wound of abdominal wall, right lower quadrant without penetration  into peritoneal cavity, initial encounter Essential (primary) hypertension Morbid (severe) obesity due to excess calories Chronic obstructive pulmonary disease, unspecified Plan Wound Cleansing: Wound #1 Right Abdomen - Lower Quadrant: Dial antibacterial soap, wash wounds, rinse and pat dry prior to dressing wounds May Shower, gently pat wound dry prior to applying new dressing. Primary Wound Dressing: Wound #1 Right Abdomen - Lower Quadrant: Hydrafera Blue Ready Transfer - tucked into the area Dressing Change Frequency: Wound #1 Right Abdomen - Lower Quadrant: Change dressing every day. Follow-up Appointments: Wound #1 Right Abdomen - Lower Quadrant: Return Appointment in 1 week. Home Health: Wound #1 Right Abdomen - Lower Quadrant: [place] to visit patient weekly for wound care Home Health Nurse may visit PRN to address patient s wound care needs. FACE TO FACE ENCOUNTER: MEDICARE and MEDICAID PATIENTS: I certify that this patient is under my care and that I had a face-to-face encounter that meets the physician face-to-face encounter requirements with this patient on this date. The encounter with the patient was in whole or in part for the following MEDICAL CONDITION:  (primary reason for [place]) MEDICAL NECESSITY: I certify, that based on my findings, NURSING services are a medically necessary home health service. HOME BOUND STATUS: I certify that my clinical findings support that this patient is homebound (i.e., Due to KARYL, SHARRAR. ([id number]) illness or injury, pt requires aid of supportive devices such as crutches, cane, wheelchairs, walkers, the use of special transportation or the assistance of another person to leave their place of residence. There is a normal inability to leave the home and doing so requires considerable and taxing effort. Other absences are for medical reasons / religious services and are infrequent or of short duration when for other reasons). If current dressing causes regression in wound condition, may D/C ordered dressing product/s and apply Normal Saline Moist Dressing daily until next [place] / Other MD appointment. [place] of regression in wound condition at [phone]. Please direct any NON-WOUND related issues/requests for orders to patient's Primary Care Physician 1. I would recommend that we switch to [facility] dressing to see if this can be beneficial for the patient she is in agreement the plan. 2. I would also suggest that we go ahead and have her use the nystatin powder if she feels like this is beneficial for her. With that being said I do not see any signs of active tinea infection I do not think that is an absolute necessity at this time. 3. I do think trying to keep the area as dry as possible is still low but most importance and we discussed how to go about that as well. We will see patient back for reevaluation in 1 week here in the clinic. If anything worsens or changes patient will contact our office for additional recommendations. Electronic Signature(s) Signed: 07/10/2019 9:55:50 AM By: Gretta Cool, BSN, RN, CWS, Kim RN, BSN Signed: 07/10/2019 11:27:42 AM By: [name] PA-C Previous Signature: 07/04/2019 6:45:10 PM Version By: [name] PA-C Entered By: Gretta Cool BSN, RN, CWS, Kim on 07/10/2019 09:55:50 [place], [name] ([id number]) -------------------------------------------------------------------------------- ROS/PFSH Details Patient Name: Breanna Cleverly H. Date of Service: 07/04/2019 10:45 AM Medical Record Number: [id number] Patient Account Number: [id number] Date of Birth/Sex: Feb 04, 1946 (73 y.o. F) Treating RN: Montey Hora Primary Care Provider: Harrel Lemon Other Clinician: Referring Provider: Harrel Lemon Treating Provider/Extender: Melburn Hake, Myrah Strawderman Weeks in Treatment: 2 Information Obtained From Patient Constitutional  Symptoms (General Health) Complaints and Symptoms: Negative for: Fatigue; Fever; Chills; Marked Weight Change Respiratory Complaints and Symptoms: Negative for: Chronic or frequent coughs; Shortness of Breath Medical History: Positive for: Asthma; Chronic Obstructive Pulmonary Disease (COPD) Negative for: Pneumothorax; Sleep Apnea; Tuberculosis Cardiovascular Complaints and Symptoms: Negative for: Chest pain; LE edema Medical History: Positive for: Hypertension Negative for: Angina; Arrhythmia; Congestive Heart Failure; Coronary Artery Disease; Deep Vein Thrombosis; Hypotension; Myocardial Infarction; Peripheral Arterial Disease; Peripheral Venous Disease; Phlebitis; Vasculitis Psychiatric Complaints and Symptoms: Negative for: Anxiety; Claustrophobia Medical History: Negative for: Anorexia/bulimia; Confinement Anxiety Eyes Medical History: Negative for: Cataracts; Glaucoma; Optic Neuritis Hematologic/Lymphatic Medical History: Positive for: Anemia Negative for: Hemophilia; Human Immunodeficiency Virus; Lymphedema; Sickle Cell Disease Gastrointestinal Medical History: Negative for: Cirrhosis ; Colitis; Crohnos; Hepatitis A; Hepatitis B; Hepatitis C [name], [name] ([id number]) Endocrine Medical  History: Negative for: Type I Diabetes; Type II Diabetes Genitourinary Medical History: Negative for: End Stage Renal Disease Immunological Medical History: Negative for: Lupus Erythematosus; Raynaudos; Scleroderma Integumentary (Skin) Medical History: Negative for: History of Burn; History of pressure wounds Musculoskeletal Medical History: Positive for: Osteoarthritis Negative for: Gout; Rheumatoid Arthritis; Osteomyelitis Neurologic Medical History: Positive for: Neuropathy Negative for: Dementia; Quadriplegia; Paraplegia; Seizure Disorder Immunizations Pneumococcal Vaccine: Received Pneumococcal Vaccination: No Tetanus Vaccine: Last tetanus shot: 02/27/2018 Implantable Devices None Family and Social History Cancer: Yes - Child; Diabetes: Yes - Mother; Heart Disease: Yes - Child; Hereditary Spherocytosis: No; Hypertension: Yes - Child; Kidney Disease: No; Lung Disease: No; Seizures: No; Stroke: No; Thyroid Problems: Yes - Child; Tuberculosis: No; Former smoker - 40 years - ended on 05/30/2016; Marital Status - Married; Alcohol Use: Never; Drug Use: No History; Caffeine Use: Daily - soda; Financial Concerns: No; Food, Clothing or Shelter Needs: No; Support System Lacking: No; Transportation Concerns: No Physician Affirmation I have reviewed and agree with the above information. Electronic Signature(s) Signed: 07/04/2019 6:57:28 PM By: [name] PA-C Signed: 07/08/2019 4:33:06 PM By: Montey Hora Entered By: [name] on 07/04/2019 18:43:51 [place], [name] ([id number]) -------------------------------------------------------------------------------- SuperBill Details Patient Name: [name]. Date of Service: 07/04/2019 Medical Record Number: [id number] Patient Account Number: [id number] Date of Birth/Sex: 19-Oct-1946 (73 y.o. F) Treating RN: Montey Hora Primary Care Provider: Harrel Lemon Other Clinician: Referring Provider: Harrel Lemon Treating  Provider/Extender: Melburn Hake, Jireh Elmore Weeks in Treatment: 2 Diagnosis Coding ICD-10 Codes Code Description L98.9 Disorder of the skin and subcutaneous tissue, unspecified B35.4 Tinea corporis Unspecified open wound of abdominal wall, right lower quadrant without penetration into peritoneal S31.103A cavity, initial encounter I10 Essential (primary) hypertension E66.01 Morbid (severe) obesity due to excess calories J44.9 Chronic obstructive pulmonary disease, unspecified Facility Procedures CPT4 Code: [id number] Description: 99213 - WOUND CARE VISIT-LEV 3 EST PT Modifier: Quantity: 1 Physician Procedures CPT4: Description Modifier Quantity Code [id number] 99214 - WC PHYS LEVEL 4 - EST PT 1 ICD-10 Diagnosis Description L98.9 Disorder of the skin and subcutaneous tissue, unspecified B35.4 Tinea corporis S31.103A Unspecified open wound of abdominal wall, right  lower quadrant without penetration into peritoneal cavity, initial encounter I10 Essential (primary) hypertension Electronic Signature(s) Signed: 07/04/2019 6:45:30 PM By: [name] PA-C Entered By: [name] on 07/04/2019 18:45:30

## 2019-07-04 NOTE — Progress Notes (Addendum)
BELANNA, MANRING ([id number]) Visit Report for 07/04/2019 Arrival Information Details Patient Name: [name], [name]. Date of Service: 07/04/2019 10:45 AM Medical Record Number: [id number] Patient Account Number: [id number] Date of Birth/Sex: [birth year] (73 y.o. F) Treating RN: Montey Hora Primary Care Yosselin Zoeller: Harrel Lemon Other Clinician: Referring Shervon Kerwin: Harrel Lemon Treating Trayven Lumadue/Extender: Melburn Hake, HOYT Weeks in Treatment: 2 Visit Information History Since Last Visit Added or deleted any medications: No Patient Arrived: Walker Any new allergies or adverse reactions: No Arrival Time: 10:56 Had a fall or experienced change in No Accompanied By: husband activities of daily living that may affect Transfer Assistance: None risk of falls: Patient Identification Verified: Yes Signs or symptoms of abuse/neglect since last visito No Secondary Verification Process Completed: Yes Hospitalized since last visit: No Implantable device outside of the clinic excluding No cellular tissue based products placed in the center since last visit: Pain Present Now: No Electronic Signature(s) Signed: 07/04/2019 3:54:12 PM By: Lorine Bears RCP, RRT, CHT Entered By: Lorine Bears on 07/04/2019 11:00:30 Wichman, [name] ([id number]) -------------------------------------------------------------------------------- Clinic Level of Care Assessment Details Patient Name: Breanna, [name]. Date of Service: 07/04/2019 10:45 AM Medical Record Number: [id number] Patient Account Number: [id number] Date of Birth/Sex: January 23, 1946 (73 y.o. F) Treating RN: Montey Hora Primary Care Pharaoh Pio: Harrel Lemon Other Clinician: Referring Anddy Wingert: Harrel Lemon Treating Amelianna Meller/Extender: Melburn Hake, HOYT Weeks in Treatment: 2 Clinic Level of Care Assessment Items TOOL 4 Quantity Score []  - Use when only an EandM is performed on FOLLOW-UP visit 0 ASSESSMENTS - Nursing Assessment /  Reassessment X - Reassessment of Co-morbidities (includes updates in patient status) 1 10 X- 1 5 Reassessment of Adherence to Treatment Plan ASSESSMENTS - Wound and Skin Assessment / Reassessment X - Simple Wound Assessment / Reassessment - one wound 1 5 []  - 0 Complex Wound Assessment / Reassessment - multiple wounds []  - 0 Dermatologic / Skin Assessment (not related to wound area) ASSESSMENTS - Focused Assessment []  - Circumferential Edema Measurements - multi extremities 0 []  - 0 Nutritional Assessment / Counseling / Intervention []  - 0 Lower Extremity Assessment (monofilament, tuning fork, pulses) []  - 0 Peripheral Arterial Disease Assessment (using hand held doppler) ASSESSMENTS - Ostomy and/or Continence Assessment and Care []  - Incontinence Assessment and Management 0 []  - 0 Ostomy Care Assessment and Management (repouching, etc.) PROCESS - Coordination of Care X - Simple Patient / Family Education for ongoing care 1 15 []  - 0 Complex (extensive) Patient / Family Education for ongoing care X- 1 10 Staff obtains Programmer, systems, Records, Test Results / Process Orders []  - 0 Staff telephones HHA, Nursing Homes / Clarify orders / etc []  - 0 Routine Transfer to another Facility (non-emergent condition) []  - 0 Routine Hospital Admission (non-emergent condition) []  - 0 New Admissions / Biomedical engineer / Ordering NPWT, Apligraf, etc. []  - 0 Emergency Hospital Admission (emergent condition) X- 1 10 Simple Discharge Coordination [place], Ahmani H. ([id number]) []  - 0 Complex (extensive) Discharge Coordination PROCESS - Special Needs []  - Pediatric / Minor Patient Management 0 []  - 0 Isolation Patient Management []  - 0 Hearing / Language / Visual special needs []  - 0 Assessment of Community assistance (transportation, D/C planning, etc.) []  - 0 Additional assistance / Altered mentation []  - 0 Support Surface(s) Assessment (bed, cushion, seat, etc.) INTERVENTIONS -  Wound Cleansing / Measurement X - Simple Wound Cleansing - one wound 1 5 []  - 0 Complex Wound Cleansing - multiple wounds X- 1 5 Wound Imaging (photographs -  any number of wounds) []  - 0 Wound Tracing (instead of photographs) X- 1 5 Simple Wound Measurement - one wound []  - 0 Complex Wound Measurement - multiple wounds INTERVENTIONS - Wound Dressings X - Small Wound Dressing one or multiple wounds 1 10 []  - 0 Medium Wound Dressing one or multiple wounds []  - 0 Large Wound Dressing one or multiple wounds []  - 0 Application of Medications - topical []  - 0 Application of Medications - injection INTERVENTIONS - Miscellaneous []  - External ear exam 0 []  - 0 Specimen Collection (cultures, biopsies, blood, body fluids, etc.) []  - 0 Specimen(s) / Culture(s) sent or taken to Lab for analysis []  - 0 Patient Transfer (multiple staff / Civil Service fast streamer / Similar devices) []  - 0 Simple Staple / Suture removal (25 or less) []  - 0 Complex Staple / Suture removal (26 or more) []  - 0 Hypo / Hyperglycemic Management (close monitor of Blood Glucose) []  - 0 Ankle / Brachial Index (ABI) - do not check if billed separately X- 1 5 Vital Signs Poole, Adilene H. ([id number]) Has the patient been seen at the hospital within the last three years: Yes Total Score: 85 Level Of Care: New/Established - Level 3 Electronic Signature(s) Signed: 07/04/2019 4:03:55 PM By: Montey Hora Entered By: Montey Hora on 07/04/2019 12:43:55 Talford, [name] ([id number]) -------------------------------------------------------------------------------- Encounter Discharge Information Details Patient Name: Breanna Cleverly H. Date of Service: 07/04/2019 10:45 AM Medical Record Number: [id number] Patient Account Number: [id number] Date of Birth/Sex: Jul 13, 1946 (73 y.o. F) Treating RN: Army Melia Primary Care Miracle Mongillo: Harrel Lemon Other Clinician: Referring Caetano Oberhaus: Harrel Lemon Treating Carmin Dibartolo/Extender: Melburn Hake,  HOYT Weeks in Treatment: 2 Encounter Discharge Information Items Discharge Condition: Stable Ambulatory Status: Walker Discharge Destination: Home Transportation: Private Auto Accompanied By: self Schedule Follow-up Appointment: Yes Clinical Summary of Care: Electronic Signature(s) Signed: 07/04/2019 2:53:02 PM By: Army Melia Entered By: Army Melia on 07/04/2019 11:52:08 Zakarian, [name] ([id number]) -------------------------------------------------------------------------------- Lower Extremity Assessment Details Patient Name: Breanna Cleverly H. Date of Service: 07/04/2019 10:45 AM Medical Record Number: [id number] Patient Account Number: [id number] Date of Birth/Sex: 1946-08-28 (73 y.o. F) Treating RN: Army Melia Primary Care Briany [name]: Harrel Lemon Other Clinician: Referring Jolleen Seman: Harrel Lemon Treating Atwell Mcdanel/Extender: Melburn Hake, HOYT Weeks in Treatment: 2 Electronic Signature(s) Signed: 07/04/2019 11:25:26 AM By: Army Melia Entered By: Army Melia on 07/04/2019 11:03:44 Schuler, [name] ([id number]) -------------------------------------------------------------------------------- Multi Wound Chart Details Patient Name: Breanna Cleverly H. Date of Service: 07/04/2019 10:45 AM Medical Record Number: [id number] Patient Account Number: [id number] Date of Birth/Sex: Aug 17, 1946 (73 y.o. F) Treating RN: Montey Hora Primary Care Yoshi Mancillas: Harrel Lemon Other Clinician: Referring Keath Matera: Harrel Lemon Treating Kyaire Gruenewald/Extender: Melburn Hake, HOYT Weeks in Treatment: 2 Vital Signs Height(in): 58 Pulse(bpm): 40 Weight(lbs): 203 Blood Pressure(mmHg): 105/56 Body Mass Index(BMI): 42 Temperature(F): 98.8 Respiratory Rate 16 (breaths/min): Photos: [N/A:N/A] Wound Location: Right Abdomen - Lower N/A N/A Quadrant Wounding Event: Gradually Appeared N/A N/A Primary Etiology: MASD N/A N/A Comorbid History: Anemia, Asthma, Chronic N/A N/A Obstructive Pulmonary Disease  (COPD), Hypertension, Osteoarthritis, Neuropathy Date Acquired: 05/14/2019 N/A N/A Weeks of Treatment: 2 N/A N/A Wound Status: Open N/A N/A Measurements L x W x D 0.9x3x0.1 N/A N/A (cm) Area (cm) : 2.121 N/A N/A Volume (cm) : 0.212 N/A N/A % Reduction in Area: 40.00% N/A N/A % Reduction in Volume: 39.90% N/A N/A Classification: Full Thickness Without N/A N/A Exposed Support Structures Exudate Amount: Medium N/A N/A Exudate Type: Serous N/A N/A Exudate Color: amber N/A N/A Wound Margin: Flat and  Intact N/A N/A Granulation Amount: Medium (34-66%) N/A N/A Granulation Quality: Pink N/A N/A Necrotic Amount: Medium (34-66%) N/A N/A Exposed Structures: Fat Layer (Subcutaneous N/A N/A Tissue) Exposed: Yes Martorano, Nakema H. ([id number]) Fascia: No Tendon: No Muscle: No Joint: No Bone: No Epithelialization: None N/A N/A Treatment Notes Electronic Signature(s) Signed: 07/04/2019 4:03:55 PM By: Montey Hora Entered By: Montey Hora on 07/04/2019 11:44:21 Capaldi, [name] ([id number]) -------------------------------------------------------------------------------- Multi-Disciplinary Care Plan Details Patient Name: [name]. Date of Service: 07/04/2019 10:45 AM Medical Record Number: [id number] Patient Account Number: [id number] Date of Birth/Sex: 04-21-1946 (73 y.o. F) Treating RN: Montey Hora Primary Care Terri Malerba: Harrel Lemon Other Clinician: Referring Starlina Lapre: Harrel Lemon Treating Damin Salido/Extender: Melburn Hake, HOYT Weeks in Treatment: 2 Active Inactive Abuse / Safety / Falls / Self Care Management Nursing Diagnoses: Potential for falls Goals: Patient will remain injury free related to falls Date Initiated: 06/20/2019 Target Resolution Date: 09/06/2019 Goal Status: Active Interventions: Assess fall risk on admission and as needed Notes: Orientation to the Wound Care Program Nursing Diagnoses: Knowledge deficit related to the wound healing center  program Goals: Patient/caregiver will verbalize understanding of the [facility] Date Initiated: 06/20/2019 Target Resolution Date: 09/06/2019 Goal Status: Active Interventions: Provide education on orientation to the wound center Notes: Wound/Skin Impairment Nursing Diagnoses: Impaired tissue integrity Goals: Ulcer/skin breakdown will heal within 14 weeks Date Initiated: 06/20/2019 Target Resolution Date: 09/06/2019 Goal Status: Active Interventions: Assess patient/caregiver ability to obtain necessary supplies Ibrahim, Tammy H. ([id number]) Assess patient/caregiver ability to perform ulcer/skin care regimen upon admission and as needed Assess ulceration(s) every visit Notes: Electronic Signature(s) Signed: 07/04/2019 4:03:55 PM By: Montey Hora Entered By: Montey Hora on 07/04/2019 11:43:15 Knapke, [name] ([id number]) -------------------------------------------------------------------------------- Pain Assessment Details Patient Name: [name]. Date of Service: 07/04/2019 10:45 AM Medical Record Number: [id number] Patient Account Number: [id number] Date of Birth/Sex: [birth year] (73 y.o. F) Treating RN: Montey Hora Primary Care Ludwika Rodd: Harrel Lemon Other Clinician: Referring Ewen Varnell: Harrel Lemon Treating Luciann Gossett/Extender: Melburn Hake, HOYT Weeks in Treatment: 2 Active Problems Location of Pain Severity and Description of Pain Patient Has Paino No Site Locations Pain Management and Medication Current Pain Management: Electronic Signature(s) Signed: 07/04/2019 3:54:12 PM By: Lorine Bears RCP, RRT, CHT Signed: 07/04/2019 4:03:55 PM By: Montey Hora Entered By: Lorine Bears on 07/04/2019 11:00:38 Lofgren, [name] ([id number]) -------------------------------------------------------------------------------- Wound Assessment Details Patient Name: Breanna Cleverly H. Date of Service: 07/04/2019 10:45 AM Medical Record  Number: [id number] Patient Account Number: [id number] Date of Birth/Sex: May 06, 1946 (73 y.o. F) Treating RN: Montey Hora Primary Care Aleisa Howk: Harrel Lemon Other Clinician: Referring Buryl Bamber: Harrel Lemon Treating Evaleen Sant/Extender: Melburn Hake, HOYT Weeks in Treatment: 2 Wound Status Wound Number: 1 Primary MASD Etiology: Wound Location: Right Abdomen - Lower Quadrant Wound Open Wounding Event: Gradually Appeared Status: Date Acquired: 05/14/2019 Comorbid Anemia, Asthma, Chronic Obstructive Weeks Of Treatment: 2 History: Pulmonary Disease (COPD), Hypertension, Clustered Wound: No Osteoarthritis, Neuropathy Photos Wound Measurements Length: (cm) 0.9 Width: (cm) 3 Depth: (cm) 0.1 Area: (cm) 2.121 Volume: (cm) 0.212 % Reduction in Area: 40% % Reduction in Volume: 39.9% Epithelialization: None Tunneling: No Undermining: No Wound Description Full Thickness Without Exposed Support Foul Odo Classification: Structures Slough/F Wound Margin: Flat and Intact Exudate Medium Amount: Exudate Type: Serous Exudate Color: amber r After Cleansing: No ibrino Yes Wound Bed Granulation Amount: Medium (34-66%) Exposed Structure Granulation Quality: Pink Fascia Exposed: No Necrotic Amount: Medium (34-66%) Fat Layer (Subcutaneous Tissue) Exposed: Yes Necrotic Quality: Adherent Slough Tendon Exposed: No Muscle  Exposed: No Joint Exposed: No Bone Exposed: No Mahrt, Constanza H. ([id number]) Treatment Notes Wound #1 (Right Abdomen - Lower Quadrant) Notes hydrofera blue Electronic Signature(s) Signed: 07/04/2019 4:03:55 PM By: Montey Hora Previous Signature: 07/04/2019 11:25:26 AM Version By: Army Melia Entered By: Montey Hora on 07/04/2019 11:43:51 Kruser, [name] ([id number]) -------------------------------------------------------------------------------- Vitals Details Patient Name: Breanna Cleverly H. Date of Service: 07/04/2019 10:45 AM Medical Record Number:  [id number] Patient Account Number: [id number] Date of Birth/Sex: [birth year] (73 y.o. F) Treating RN: Montey Hora Primary Care Marieelena Bartko: Harrel Lemon Other Clinician: Referring Tayvia Faughnan: Harrel Lemon Treating Shiquita Collignon/Extender: Melburn Hake, HOYT Weeks in Treatment: 2 Vital Signs Time Taken: 11:00 Temperature (F): 98.8 Height (in): 58 Pulse (bpm): 74 Weight (lbs): 203 Respiratory Rate (breaths/min): 16 Body Mass Index (BMI): 42.4 Blood Pressure (mmHg): 105/56 Reference Range: 80 - 120 mg / dl Electronic Signature(s) Signed: 07/04/2019 3:54:12 PM By: Lorine Bears RCP, RRT, CHT Entered By: Lorine Bears on 07/04/2019 11:02:14

## 2019-07-11 ENCOUNTER — Encounter: Payer: Medicare Other | Admitting: Physician Assistant

## 2019-07-11 ENCOUNTER — Other Ambulatory Visit: Payer: Self-pay

## 2019-07-11 DIAGNOSIS — L98499 Non-pressure chronic ulcer of skin of other sites with unspecified severity: Secondary | ICD-10-CM | POA: Diagnosis not present

## 2019-07-11 NOTE — Progress Notes (Addendum)
KEOSHIA, STEINMETZ ([id number]) Visit Report for 07/11/2019 Chief Complaint Document Details Patient Name: [name], [name]. Date of Service: 07/11/2019 8:30 AM Medical Record Number: [id number] Patient Account Number: [id number] Date of Birth/Sex: 1945/12/23 (73 y.o. F) Treating RN: Montey Hora Primary Care Provider: Harrel Lemon Other Clinician: Referring Provider: Harrel Lemon Treating Provider/Extender: Melburn Hake, Jordin Vicencio Weeks in Treatment: 3 Information Obtained from: Patient Chief Complaint Right Lower Quadrant Abdominal Ulcer Electronic Signature(s) Signed: 07/11/2019 8:23:28 AM By: [name] PA-C Entered By: [name] on 07/11/2019 08:23:28 Sehgal, [name] ([id number]) -------------------------------------------------------------------------------- HPI Details Patient Name: [name]. Date of Service: 07/11/2019 8:30 AM Medical Record Number: [id number] Patient Account Number: [id number] Date of Birth/Sex: Feb 19, 1946 (73 y.o. F) Treating RN: Montey Hora Primary Care Provider: Harrel Lemon Other Clinician: Referring Provider: Harrel Lemon Treating Provider/Extender: Melburn Hake, Colonel Krauser Weeks in Treatment: 3 History of Present Illness HPI Description: 06/20/2019 on evaluation today patient presents for initial inspection here in our office concerning issue that has been going on for the past several weeks with her moisture associated breakdown in the right lower quadrant of the abdominal region underneath her pannus. Subsequently she is not having any pain which is at least good news. Her primary care provider did give her a prescription for nystatin powder as well as doxycycline this ends on the 28th as far as doxycycline is concerned that is just 1 week away. Nonetheless I do not see any evidence of active infection at this time which is good news. With that being said the wound itself does appear to be in a very precarious location right at the crease which  will make it more difficult to heal secondary to moisture issues. Nonetheless I think that potentially an alginate dressing could be of benefit here. No fevers, chills, nausea, vomiting, or diarrhea. She does have a history of COPD, hypertension, obesity, and more recently in this area a tinea corporis infection. 06/27/2019 on evaluation today patient actually appears to be doing I believe okay with regard to her wound. There is much less irritation today compared to the last visit I feel like the nystatin powder has helped in that regard also feel like she is much less macerated than previously noted. With that being said there is a slight hint of new skin growing around the edges of the wound which is good news obviously this is just can take some time. 07/04/19 on evaluation today patient appears to be doing well with regard to her right lower quadrant abdominal ulcer. She is making some progress albeit very slow. There does not appear to be any fungal/yeast infection at this time which is good news. With that being said there does appear to be slow epithelialization around the edge of the wound. The wound bed itself is not quite as healthy as I would like to see though no slough there just really is not a lot of good granulation tissue there is a lot of what appears to be scar tissue which obviously is much harder to get new skin to grow over. I think she may benefit from switching to [facility] dressing to see if this could be helpful in speeding up the healing process. 07/11/2019 on evaluation today patient actually appears to be making excellent progress in my opinion with regard to her abdominal wall ulcer. She has been tolerating the dressing changes without complication. Fortunately there is no signs of active infection at this time. No fevers, chills, nausea, vomiting, or diarrhea. Electronic  Signature(s) Signed: 07/11/2019 8:48:46 AM By: [name] PA-C Entered By: [name]  on 07/11/2019 08:48:46 Staller, [name] ([id number]) -------------------------------------------------------------------------------- Physical Exam Details Patient Name: Breanna Cleverly H. Date of Service: 07/11/2019 8:30 AM Medical Record Number: [id number] Patient Account Number: [id number] Date of Birth/Sex: [birth year] (73 y.o. F) Treating RN: Montey Hora Primary Care Provider: Harrel Lemon Other Clinician: Referring Provider: Harrel Lemon Treating Provider/Extender: Melburn Hake, Duha Abair Weeks in Treatment: 3 Constitutional Well-nourished and well-hydrated in no acute distress. Respiratory normal breathing without difficulty. clear to auscultation bilaterally. Cardiovascular regular rate and rhythm with normal S1, S2. Psychiatric this patient is able to make decisions and demonstrates good insight into disease process. Alert and Oriented x 3. pleasant and cooperative. Notes Patient's wound bed showed signs of good epithelialization from top and bottom and again this is in my opinion healing quite nicely and appears to be doing much better than even previously noted. Overall I feel like the Hydrofera Blue has done a great job for her I would recommend continuing with that. Electronic Signature(s) Signed: 07/11/2019 8:49:12 AM By: [name] PA-C Entered By: [name] on 07/11/2019 08:49:12 Lair, [name] ([id number]) -------------------------------------------------------------------------------- Physician Orders Details Patient Name: [name]. Date of Service: 07/11/2019 8:30 AM Medical Record Number: [id number] Patient Account Number: [id number] Date of Birth/Sex: 1946-08-01 (73 y.o. F) Treating RN: Montey Hora Primary Care Provider: Harrel Lemon Other Clinician: Referring Provider: Harrel Lemon Treating Provider/Extender: Melburn Hake, Jessabelle Markiewicz Weeks in Treatment: 3 Verbal / Phone Orders: No Diagnosis Coding ICD-10 Coding Code Description L98.9 Disorder of  the skin and subcutaneous tissue, unspecified B35.4 Tinea corporis Unspecified open wound of abdominal wall, right lower quadrant without penetration into peritoneal S31.103A cavity, initial encounter I10 Essential (primary) hypertension E66.01 Morbid (severe) obesity due to excess calories J44.9 Chronic obstructive pulmonary disease, unspecified Wound Cleansing Wound #1 Right Abdomen - Lower Quadrant o Dial antibacterial soap, wash wounds, rinse and pat dry prior to dressing wounds o May Shower, gently pat wound dry prior to applying new dressing. Primary Wound Dressing Wound #1 Right Abdomen - Lower Quadrant o Hydrafera Blue Ready Transfer - tucked into the area Dressing Change Frequency Wound #1 Right Abdomen - Lower Quadrant o Change dressing every day. Follow-up Appointments Wound #1 Right Abdomen - Lower Quadrant o Return Appointment in 1 week. Electronic Signature(s) Signed: 07/11/2019 1:33:12 PM By: [name] PA-C Signed: 07/11/2019 2:09:28 PM By: Montey Hora Entered By: Montey Hora on 07/11/2019 08:47:46 Felling, [name] ([id number]) -------------------------------------------------------------------------------- Problem List Details Patient Name: Rincon, Katharine Look H. Date of Service: 07/11/2019 8:30 AM Medical Record Number: [id number] Patient Account Number: [id number] Date of Birth/Sex: 20-Nov-1945 (73 y.o. F) Treating RN: Montey Hora Primary Care Provider: Harrel Lemon Other Clinician: Referring Provider: Harrel Lemon Treating Provider/Extender: Melburn Hake, Reneka Nebergall Weeks in Treatment: 3 Active Problems ICD-10 Evaluated Encounter Code Description Active Date Today Diagnosis L98.9 Disorder of the skin and subcutaneous tissue, unspecified 06/20/2019 No Yes B35.4 Tinea corporis 06/20/2019 No Yes S31.103A Unspecified open wound of abdominal wall, right lower 06/20/2019 No Yes quadrant without penetration into peritoneal cavity, initial encounter I10  Essential (primary) hypertension 06/20/2019 No Yes E66.01 Morbid (severe) obesity due to excess calories 06/20/2019 No Yes J44.9 Chronic obstructive pulmonary disease, unspecified 06/20/2019 No Yes Inactive Problems Resolved Problems Electronic Signature(s) Signed: 07/11/2019 8:23:21 AM By: [name] PA-C Entered By: [name] on 07/11/2019 08:23:21 Jacinto, [name] ([id number]) -------------------------------------------------------------------------------- Progress Note Details Patient Name: [name]. Date of Service:  07/11/2019 8:30 AM Medical Record Number: [id number] Patient Account Number: [id number] Date of Birth/Sex: 14-Jun-1946 (72 y.o. F) Treating RN: Montey Hora Primary Care Provider: Harrel Lemon Other Clinician: Referring Provider: Harrel Lemon Treating Provider/Extender: Melburn Hake, Zion Lint Weeks in Treatment: 3 Subjective Chief Complaint Information obtained from Patient Right Lower Quadrant Abdominal Ulcer History of Present Illness (HPI) 06/20/2019 on evaluation today patient presents for initial inspection here in our office concerning issue that has been going on for the past several weeks with her moisture associated breakdown in the right lower quadrant of the abdominal region underneath her pannus. Subsequently she is not having any pain which is at least good news. Her primary care provider did give her a prescription for nystatin powder as well as doxycycline this ends on the 28th as far as doxycycline is concerned that is just 1 week away. Nonetheless I do not see any evidence of active infection at this time which is good news. With that being said the wound itself does appear to be in a very precarious location right at the crease which will make it more difficult to heal secondary to moisture issues. Nonetheless I think that potentially an alginate dressing could be of benefit here. No fevers, chills, nausea, vomiting, or diarrhea. She does have  a history of COPD, hypertension, obesity, and more recently in this area a tinea corporis infection. 06/27/2019 on evaluation today patient actually appears to be doing I believe okay with regard to her wound. There is much less irritation today compared to the last visit I feel like the nystatin powder has helped in that regard also feel like she is much less macerated than previously noted. With that being said there is a slight hint of new skin growing around the edges of the wound which is good news obviously this is just can take some time. 07/04/19 on evaluation today patient appears to be doing well with regard to her right lower quadrant abdominal ulcer. She is making some progress albeit very slow. There does not appear to be any fungal/yeast infection at this time which is good news. With that being said there does appear to be slow epithelialization around the edge of the wound. The wound bed itself is not quite as healthy as I would like to see though no slough there just really is not a lot of good granulation tissue there is a lot of what appears to be scar tissue which obviously is much harder to get new skin to grow over. I think she may benefit from switching to [facility] dressing to see if this could be helpful in speeding up the healing process. 07/11/2019 on evaluation today patient actually appears to be making excellent progress in my opinion with regard to her abdominal wall ulcer. She has been tolerating the dressing changes without complication. Fortunately there is no signs of active infection at this time. No fevers, chills, nausea, vomiting, or diarrhea. Patient History Information obtained from Patient. Family History Cancer - Child, Diabetes - Mother, Heart Disease - Child, Hypertension - Child, Thyroid Problems - Child, No family history of Hereditary Spherocytosis, Kidney Disease, Lung Disease, Seizures, Stroke, Tuberculosis. Social History Former smoker - 40  years - ended on 05/30/2016, Marital Status - Married, Alcohol Use - Never, Drug Use - No History, Caffeine Use - Daily - soda. Medical History Eyes Denies history of Cataracts, Glaucoma, Optic Neuritis Deak, Mahika H. ([id number]) Hematologic/Lymphatic Patient has history of Anemia Denies history of Hemophilia, Human Immunodeficiency Virus, Lymphedema,  Sickle Cell Disease Respiratory Patient has history of Asthma, Chronic Obstructive Pulmonary Disease (COPD) Denies history of Pneumothorax, Sleep Apnea, Tuberculosis Cardiovascular Patient has history of Hypertension Denies history of Angina, Arrhythmia, Congestive Heart Failure, Coronary Artery Disease, Deep Vein Thrombosis, Hypotension, Myocardial Infarction, Peripheral Arterial Disease, Peripheral Venous Disease, Phlebitis, Vasculitis Gastrointestinal Denies history of Cirrhosis , Colitis, Crohn s, Hepatitis A, Hepatitis B, Hepatitis C Endocrine Denies history of Type I Diabetes, Type II Diabetes Genitourinary Denies history of End Stage Renal Disease Immunological Denies history of Lupus Erythematosus, Raynaud s, Scleroderma Integumentary (Skin) Denies history of History of Burn, History of pressure wounds Musculoskeletal Patient has history of Osteoarthritis Denies history of Gout, Rheumatoid Arthritis, Osteomyelitis Neurologic Patient has history of Neuropathy Denies history of Dementia, Quadriplegia, Paraplegia, Seizure Disorder Psychiatric Denies history of Anorexia/bulimia, Confinement Anxiety Review of Systems (ROS) Constitutional Symptoms (General Health) Denies complaints or symptoms of Fatigue, Fever, Chills, Marked Weight Change. Respiratory Denies complaints or symptoms of Chronic or frequent coughs, Shortness of Breath. Cardiovascular Denies complaints or symptoms of Chest pain, LE edema. Psychiatric Denies complaints or symptoms of Anxiety, Claustrophobia. Objective Constitutional Well-nourished and  well-hydrated in no acute distress. Vitals Time Taken: 8:30 AM, Height: 58 in, Weight: 203 lbs, BMI: 42.4, Temperature: 98.5 F, Pulse: 68 bpm, Respiratory Rate: 18 breaths/min, Blood Pressure: 120/73 mmHg. Respiratory normal breathing without difficulty. clear to auscultation bilaterally. Nicklin, ARASELY AKKERMAN ([id number]) Cardiovascular regular rate and rhythm with normal S1, S2. Psychiatric this patient is able to make decisions and demonstrates good insight into disease process. Alert and Oriented x 3. pleasant and cooperative. General Notes: Patient's wound bed showed signs of good epithelialization from top and bottom and again this is in my opinion healing quite nicely and appears to be doing much better than even previously noted. Overall I feel like the Hydrofera Blue has done a great job for her I would recommend continuing with that. Integumentary (Hair, Skin) Wound #1 status is Open. Original cause of wound was Gradually Appeared. The wound is located on the Right Abdomen - Lower Quadrant. The wound measures 0.8cm length x 3cm width x 0.1cm depth; 1.885cm^2 area and 0.188cm^3 volume. There is Fat Layer (Subcutaneous Tissue) Exposed exposed. There is no tunneling or undermining noted. There is a medium amount of serous drainage noted. The wound margin is flat and intact. There is medium (34-66%) pink granulation within the wound bed. There is a medium (34-66%) amount of necrotic tissue within the wound bed including Adherent Slough. Assessment Active Problems ICD-10 Disorder of the skin and subcutaneous tissue, unspecified Tinea corporis Unspecified open wound of abdominal wall, right lower quadrant without penetration into peritoneal cavity, initial encounter Essential (primary) hypertension Morbid (severe) obesity due to excess calories Chronic obstructive pulmonary disease, unspecified Plan Wound Cleansing: Wound #1 Right Abdomen - Lower Quadrant: Dial antibacterial soap, wash  wounds, rinse and pat dry prior to dressing wounds May Shower, gently pat wound dry prior to applying new dressing. Primary Wound Dressing: Wound #1 Right Abdomen - Lower Quadrant: Hydrafera Blue Ready Transfer - tucked into the area Dressing Change Frequency: Wound #1 Right Abdomen - Lower Quadrant: Change dressing every day. Follow-up Appointments: Wound #1 Right Abdomen - Lower Quadrant: Return Appointment in 1 week. Asch, Tykia H. ([id number]) 1. I would recommend that we continue with the [facility] dressing as this seems to be helpful for the patient she is in agreement with the plan. 2. I am also going to suggest that we go ahead and continue with the  same dressing change frequency again this is very difficult area to really security dressing so she just tacking it into the area likely this needs to be changed at least daily if not sometimes more frequent. Nonetheless I do believe moisture control is very well maintained and there is no signs of a yeast type infection. We will see patient back for reevaluation in 1 week here in the clinic. If anything worsens or changes patient will contact our office for additional recommendations. Electronic Signature(s) Signed: 07/11/2019 8:49:54 AM By: [name] PA-C Entered By: [name] on 07/11/2019 08:49:54 Freeman, [name] ([id number]) -------------------------------------------------------------------------------- ROS/PFSH Details Patient Name: [name]. Date of Service: 07/11/2019 8:30 AM Medical Record Number: [id number] Patient Account Number: [id number] Date of Birth/Sex: [birth year] (73 y.o. F) Treating RN: Montey Hora Primary Care Provider: Harrel Lemon Other Clinician: Referring Provider: Harrel Lemon Treating Provider/Extender: Melburn Hake, Bradley Handyside Weeks in Treatment: 3 Information Obtained From Patient Constitutional Symptoms (General Health) Complaints and Symptoms: Negative for: Fatigue; Fever;  Chills; Marked Weight Change Respiratory Complaints and Symptoms: Negative for: Chronic or frequent coughs; Shortness of Breath Medical History: Positive for: Asthma; Chronic Obstructive Pulmonary Disease (COPD) Negative for: Pneumothorax; Sleep Apnea; Tuberculosis Cardiovascular Complaints and Symptoms: Negative for: Chest pain; LE edema Medical History: Positive for: Hypertension Negative for: Angina; Arrhythmia; Congestive Heart Failure; Coronary Artery Disease; Deep Vein Thrombosis; Hypotension; Myocardial Infarction; Peripheral Arterial Disease; Peripheral Venous Disease; Phlebitis; Vasculitis Psychiatric Complaints and Symptoms: Negative for: Anxiety; Claustrophobia Medical History: Negative for: Anorexia/bulimia; Confinement Anxiety Eyes Medical History: Negative for: Cataracts; Glaucoma; Optic Neuritis Hematologic/Lymphatic Medical History: Positive for: Anemia Negative for: Hemophilia; Human Immunodeficiency Virus; Lymphedema; Sickle Cell Disease Gastrointestinal Medical History: Negative for: Cirrhosis ; Colitis; Crohnos; Hepatitis A; Hepatitis B; Hepatitis C Slaubaugh, Shian H. ([id number]) Endocrine Medical History: Negative for: Type I Diabetes; Type II Diabetes Genitourinary Medical History: Negative for: End Stage Renal Disease Immunological Medical History: Negative for: Lupus Erythematosus; Raynaudos; Scleroderma Integumentary (Skin) Medical History: Negative for: History of Burn; History of pressure wounds Musculoskeletal Medical History: Positive for: Osteoarthritis Negative for: Gout; Rheumatoid Arthritis; Osteomyelitis Neurologic Medical History: Positive for: Neuropathy Negative for: Dementia; Quadriplegia; Paraplegia; Seizure Disorder Immunizations Pneumococcal Vaccine: Received Pneumococcal Vaccination: No Tetanus Vaccine: Last tetanus shot: 02/27/2018 Implantable Devices None Family and Social History Cancer: Yes - Child; Diabetes: Yes -  Mother; Heart Disease: Yes - Child; Hereditary Spherocytosis: No; Hypertension: Yes - Child; Kidney Disease: No; Lung Disease: No; Seizures: No; Stroke: No; Thyroid Problems: Yes - Child; Tuberculosis: No; Former smoker - 40 years - ended on 05/30/2016; Marital Status - Married; Alcohol Use: Never; Drug Use: No History; Caffeine Use: Daily - soda; Financial Concerns: No; Food, Clothing or Shelter Needs: No; Support System Lacking: No; Transportation Concerns: No Physician Affirmation I have reviewed and agree with the above information. Electronic Signature(s) Signed: 07/11/2019 1:33:12 PM By: [name] PA-C Signed: 07/11/2019 2:09:28 PM By: Montey Hora Entered By: [name] on 07/11/2019 08:49:03 Fanguy, [name] ([id number]) -------------------------------------------------------------------------------- SuperBill Details Patient Name: Breanna Cleverly H. Date of Service: 07/11/2019 Medical Record Number: [id number] Patient Account Number: [id number] Date of Birth/Sex: 1946-07-23 (73 y.o. F) Treating RN: Montey Hora Primary Care Provider: Harrel Lemon Other Clinician: Referring Provider: Harrel Lemon Treating Provider/Extender: Melburn Hake, Jachin Coury Weeks in Treatment: 3 Diagnosis Coding ICD-10 Codes Code Description L98.9 Disorder of the skin and subcutaneous tissue, unspecified B35.4 Tinea corporis Unspecified open wound of abdominal wall, right lower quadrant without penetration into peritoneal S31.103A cavity, initial encounter I10  Essential (primary) hypertension E66.01 Morbid (severe) obesity due to excess calories J44.9 Chronic obstructive pulmonary disease, unspecified Facility Procedures CPT4 Code: [id number] Description: 99213 - WOUND CARE VISIT-LEV 3 EST PT Modifier: Quantity: 1 Physician Procedures CPT4: Description Modifier Quantity Code [id number] - WC PHYS LEVEL 4 - EST PT 1 ICD-10 Diagnosis Description L98.9 Disorder of the skin and subcutaneous tissue,  unspecified B35.4 Tinea corporis S31.103A Unspecified open wound of abdominal wall, right  lower quadrant without penetration into peritoneal cavity, initial encounter I10 Essential (primary) hypertension Electronic Signature(s) Signed: 07/11/2019 8:50:07 AM By: [name] PA-C Entered By: [name] on 07/11/2019 08:50:07

## 2019-07-11 NOTE — Progress Notes (Addendum)
[name], [name] ([id number]) Visit Report for 07/11/2019 Arrival Information Details Patient Name: [name], [name]. Date of Service: 07/11/2019 8:30 AM Medical Record Number: [id number] Patient Account Number: [id number] Date of Birth/Sex: 12-10-1945 (73 y.o. F) Treating RN: [name] Primary Care [name]: [name] Other Clinician: Referring [name]: [name] Treating [name]/Extender: [name], [name] in Treatment: 3 Visit Information History Since Last Visit Added or deleted any medications: No Patient Arrived: Walker Any new allergies or adverse reactions: No Arrival Time: 08:30 Had a fall or experienced change in No Accompanied By: self activities of daily living that may affect Transfer Assistance: None risk of falls: Patient Identification Verified: Yes Signs or symptoms of abuse/neglect since last visito No Secondary Verification Process Completed: Yes Hospitalized since last visit: No Has Dressing in Place as Prescribed: Yes Pain Present Now: No Electronic Signature(s) Signed: 07/11/2019 1:46:59 PM By: [name] Entered By: [name] on 07/11/2019 08:30:21 [name], [name] -------------------------------------------------------------------------------- Clinic Level of Care Assessment Details Patient Name: [name], [name] Date of Service: 07/11/2019 8:30 AM Medical Record Number: [id number] Patient Account Number: [id number] Date of Birth/Sex: 1946-10-24 (73 y.o. F) Treating RN: [name] Primary Care Kaysi Ourada: [name] Other Clinician: Referring [name]: [name] Treating [name]/Extender: [name], [name] in Treatment: 3 Clinic Level of Care Assessment Items TOOL 4 Quantity Score []  - Use when only an EandM is performed on FOLLOW-UP visit 0 ASSESSMENTS - Nursing Assessment / Reassessment X - Reassessment of Co-morbidities (includes updates in patient status) 1 10 X- 1 5 Reassessment of Adherence  to Treatment Plan ASSESSMENTS - Wound and Skin Assessment / Reassessment X - Simple Wound Assessment / Reassessment - one wound 1 5 []  - 0 Complex Wound Assessment / Reassessment - multiple wounds []  - 0 Dermatologic / Skin Assessment (not related to wound area) ASSESSMENTS - Focused Assessment []  - Circumferential Edema Measurements - multi extremities 0 []  - 0 Nutritional Assessment / Counseling / Intervention []  - 0 Lower Extremity Assessment (monofilament, tuning fork, pulses) []  - 0 Peripheral Arterial Disease Assessment (using hand held doppler) ASSESSMENTS - Ostomy and/or Continence Assessment and Care []  - Incontinence Assessment and Management 0 []  - 0 Ostomy Care Assessment and Management (repouching, etc.) PROCESS - Coordination of Care X - Simple Patient / Family Education for ongoing care 1 15 []  - 0 Complex (extensive) Patient / Family Education for ongoing care X- 1 10 Staff obtains Programmer, systems, Records, Test Results / Process Orders []  - 0 Staff telephones HHA, Nursing Homes / Clarify orders / etc []  - 0 Routine Transfer to another Facility (non-emergent condition) []  - 0 Routine Hospital Admission (non-emergent condition) []  - 0 New Admissions / Biomedical engineer / Ordering NPWT, Apligraf, etc. []  - 0 Emergency Hospital Admission (emergent condition) X- 1 10 Simple Discharge Coordination [name], [name] ([id number]) []  - 0 Complex (extensive) Discharge Coordination PROCESS - Special Needs []  - Pediatric / Minor Patient Management 0 []  - 0 Isolation Patient Management []  - 0 Hearing / Language / Visual special needs []  - 0 Assessment of Community assistance (transportation, D/C planning, etc.) []  - 0 Additional assistance / Altered mentation []  - 0 Support Surface(s) Assessment (bed, cushion, seat, etc.) INTERVENTIONS - Wound Cleansing / Measurement X - Simple Wound Cleansing - one wound 1 5 []  - 0 Complex Wound Cleansing - multiple wounds X-  1 5 Wound Imaging (photographs - any number of wounds) []  - 0 Wound Tracing (instead of photographs) X- 1 5 Simple Wound  Measurement - one wound []  - 0 Complex Wound Measurement - multiple wounds INTERVENTIONS - Wound Dressings X - Small Wound Dressing one or multiple wounds 1 10 []  - 0 Medium Wound Dressing one or multiple wounds []  - 0 Large Wound Dressing one or multiple wounds []  - 0 Application of Medications - topical []  - 0 Application of Medications - injection INTERVENTIONS - Miscellaneous []  - External ear exam 0 []  - 0 Specimen Collection (cultures, biopsies, blood, body fluids, etc.) []  - 0 Specimen(s) / Culture(s) sent or taken to Lab for analysis []  - 0 Patient Transfer (multiple staff / Civil Service fast streamer / Similar devices) []  - 0 Simple Staple / Suture removal (25 or less) []  - 0 Complex Staple / Suture removal (26 or more) []  - 0 Hypo / Hyperglycemic Management (close monitor of Blood Glucose) []  - 0 Ankle / Brachial Index (ABI) - do not check if billed separately X- 1 5 Vital Signs [name], [name] ([id number]) Has the patient been seen at the hospital within the last three years: Yes Total Score: 85 Level Of Care: New/Established - Level 3 Electronic Signature(s) Signed: 07/11/2019 2:09:28 PM By: [name] Entered By: [name] on 07/11/2019 08:48:11 [name], [name] ([id number]) -------------------------------------------------------------------------------- Encounter Discharge Information Details Patient Name: Breanna Cleverly H. Date of Service: 07/11/2019 8:30 AM Medical Record Number: [id number] Patient Account Number: [id number] Date of Birth/Sex: [birth year] (73 y.o. F) Treating RN: [name] Primary Care [name]: [name] Other Clinician: Referring [name]: [name] Treating [name]/Extender: [name], [name] in Treatment: 3 Encounter Discharge Information Items Discharge Condition: Stable Ambulatory Status:  Walker Discharge Destination: Home Transportation: Private Auto Accompanied By: self Schedule Follow-up Appointment: Yes Clinical Summary of Care: Electronic Signature(s) Signed: 07/11/2019 2:09:28 PM By: [name] Entered By: [name] on 07/11/2019 08:51:11 [name], [name] ([id number]) -------------------------------------------------------------------------------- Lower Extremity Assessment Details Patient Name: Breanna Cleverly H. Date of Service: 07/11/2019 8:30 AM Medical Record Number: [id number] Patient Account Number: [id number] Date of Birth/Sex: 1946/09/06 (73 y.o. F) Treating RN: [name] Primary Care Margeret Stachnik: [name] Other Clinician: Referring Lamere Lightner: [name] Treating Iokepa Geffre/Extender: [name], [name] in Treatment: 3 Notes Abdominal wound Electronic Signature(s) Signed: 07/11/2019 1:46:59 PM By: [name] Entered By: [name] on 07/11/2019 08:35:05 [name], [name] ([id number]) -------------------------------------------------------------------------------- Multi Wound Chart Details Patient Name: Breanna Cleverly H. Date of Service: 07/11/2019 8:30 AM Medical Record Number: [id number] Patient Account Number: [id number] Date of Birth/Sex: 1946-05-11 (73 y.o. F) Treating RN: [name] Primary Care Ladaja Yusupov: [name] Other Clinician: Referring Airyonna Franklyn: [name] Treating Sharece Fleischhacker/Extender: [name], [name] in Treatment: 3 Vital Signs Height(in): 58 Pulse(bpm): 62 Weight(lbs): 203 Blood Pressure(mmHg): 120/73 Body Mass Index(BMI): 42 Temperature(F): 98.5 Respiratory Rate 18 (breaths/min): Photos: [N/A:N/A] Wound Location: Right Abdomen - Lower N/A N/A Quadrant Wounding Event: Gradually Appeared N/A N/A Primary Etiology: MASD N/A N/A Comorbid History: Anemia, Asthma, Chronic N/A N/A Obstructive Pulmonary Disease (COPD), Hypertension, Osteoarthritis, Neuropathy Date Acquired: 05/14/2019 N/A  N/A [name] of Treatment: 3 N/A N/A Wound Status: Open N/A N/A Measurements L x W x D 0.8x3x0.1 N/A N/A (cm) Area (cm) : 1.885 N/A N/A Volume (cm) : 0.188 N/A N/A % Reduction in Area: 46.70% N/A N/A % Reduction in Volume: 46.70% N/A N/A Classification: Full Thickness Without N/A N/A Exposed Support Structures Exudate Amount: Medium N/A N/A Exudate Type: Serous N/A N/A Exudate Color: amber N/A N/A Wound Margin: Flat and Intact N/A N/A Granulation Amount: Medium (34-66%) N/A N/A Granulation Quality: Pink N/A N/A  Necrotic Amount: Medium (34-66%) N/A N/A Exposed Structures: Fat Layer (Subcutaneous N/A N/A Tissue) Exposed: Yes Sparlin, Grier H. ([id number]) Fascia: No Tendon: No Muscle: No Joint: No Bone: No Epithelialization: None N/A N/A Treatment Notes Electronic Signature(s) Signed: 07/11/2019 2:09:28 PM By: [name] Entered By: [name] on 07/11/2019 08:47:15 [name], [name] ([id number]) -------------------------------------------------------------------------------- Multi-Disciplinary Care Plan Details Patient Name: Breanna Ferrari. Date of Service: 07/11/2019 8:30 AM Medical Record Number: [id number] Patient Account Number: [id number] Date of Birth/Sex: May 26, 1946 (73 y.o. F) Treating RN: [name] Primary Care Dresden Ament: [name] Other Clinician: Referring Zacary Bauer: [name] Treating Alexios Keown/Extender: [name], [name] in Treatment: 3 Active Inactive Abuse / Safety / Falls / Self Care Management Nursing Diagnoses: Potential for falls Goals: Patient will remain injury free related to falls Date Initiated: 06/20/2019 Target Resolution Date: 09/06/2019 Goal Status: Active Interventions: Assess fall risk on admission and as needed Notes: Orientation to the Wound Care Program Nursing Diagnoses: Knowledge deficit related to the wound healing center program Goals: Patient/caregiver will verbalize understanding of the [facility]  Program Date Initiated: 06/20/2019 Target Resolution Date: 09/06/2019 Goal Status: Active Interventions: Provide education on orientation to the wound center Notes: Wound/Skin Impairment Nursing Diagnoses: Impaired tissue integrity Goals: Ulcer/skin breakdown will heal within 14 [name] Date Initiated: 06/20/2019 Target Resolution Date: 09/06/2019 Goal Status: Active Interventions: Assess patient/caregiver ability to obtain necessary supplies [name], [name] ([id number]) Assess patient/caregiver ability to perform ulcer/skin care regimen upon admission and as needed Assess ulceration(s) every visit Notes: Electronic Signature(s) Signed: 07/11/2019 2:09:28 PM By: [name] Entered By: [name] on 07/11/2019 08:47:08 [name], [name] ([id number]) -------------------------------------------------------------------------------- Pain Assessment Details Patient Name: Breanna Ferrari. Date of Service: 07/11/2019 8:30 AM Medical Record Number: [id number] Patient Account Number: [id number] Date of Birth/Sex: [birth year] (73 y.o. F) Treating RN: [name] Primary Care Joni Norrod: [name] Other Clinician: Referring Darria Corvera: [name] Treating Anaeli Cornwall/Extender: [name], [name] in Treatment: 3 Active Problems Location of Pain Severity and Description of Pain Patient Has Paino No Site Locations Pain Management and Medication Current Pain Management: Electronic Signature(s) Signed: 07/11/2019 1:46:59 PM By: [name] Entered By: [name] on 07/11/2019 08:30:49 [name], [name] ([id number]) -------------------------------------------------------------------------------- Patient/Caregiver Education Details Patient Name: Breanna Ferrari. Date of Service: 07/11/2019 8:30 AM Medical Record Number: [id number] Patient Account Number: [id number] Date of Birth/Gender: 10/04/1946 (73 y.o. F) Treating RN: [name] Primary Care Physician: [name]  Other Clinician: Referring Physician: Harrel [name] Treating Physician/Extender: Sharalyn Ink in Treatment: 3 Education Assessment Education Provided To: Patient Education Topics Provided Wound/Skin Impairment: Handouts: Other: wound care as ordered Methods: Demonstration, Explain/Verbal Responses: State content correctly Electronic Signature(s) Signed: 07/11/2019 2:09:28 PM By: [name] Entered By: [name] on 07/11/2019 08:48:28 [name], [name] ([id number]) -------------------------------------------------------------------------------- Wound Assessment Details Patient Name: Breanna Cleverly H. Date of Service: 07/11/2019 8:30 AM Medical Record Number: [id number] Patient Account Number: [id number] Date of Birth/Sex: 08/11/1946 (74 y.o. F) Treating RN: [name] Primary Care Emanuel Dowson: [name] Other Clinician: Referring Elveria Lauderbaugh: [name] Treating Sinclair Alligood/Extender: [name], [name] in Treatment: 3 Wound Status Wound Number: 1 Primary MASD Etiology: Wound Location: Right Abdomen - Lower Quadrant Wound Open Wounding Event: Gradually Appeared Status: Date Acquired: 05/14/2019 Comorbid Anemia, Asthma, Chronic Obstructive [name] Of Treatment: 3 History: Pulmonary Disease (COPD), Hypertension, Clustered Wound: No Osteoarthritis, Neuropathy Photos Wound Measurements Length: (cm) 0.8 Width: (cm) 3 Depth: (cm) 0.1 Area: (cm) 1.885 Volume: (cm) 0.188 % Reduction in Area: 46.7% % Reduction in  Volume: 46.7% Epithelialization: None Tunneling: No Undermining: No Wound Description Full Thickness Without Exposed Support Foul Odo Classification: Structures Slough/F Wound Margin: Flat and Intact Exudate Medium Amount: Exudate Type: Serous Exudate Color: amber r After Cleansing: No ibrino Yes Wound Bed Granulation Amount: Medium (34-66%) Exposed Structure Granulation Quality: Pink Fascia Exposed: No Necrotic Amount: Medium  (34-66%) Fat Layer (Subcutaneous Tissue) Exposed: Yes Necrotic Quality: Adherent Slough Tendon Exposed: No Muscle Exposed: No Joint Exposed: No Bone Exposed: No Sartwell, Sherae H. ([id number]) Treatment Notes Wound #1 (Right Abdomen - Lower Quadrant) Notes hydrofera blue Electronic Signature(s) Signed: 07/11/2019 1:46:59 PM By: [name] Entered By: [name] on 07/11/2019 08:34:45 Maduro, [name] ([id number]) -------------------------------------------------------------------------------- Vitals Details Patient Name: Breanna Cleverly H. Date of Service: 07/11/2019 8:30 AM Medical Record Number: [id number] Patient Account Number: [id number] Date of Birth/Sex: [birth year] (73 y.o. F) Treating RN: [name] Primary Care Jamoni Broadfoot: [name] Other Clinician: Referring Kerington Hildebrant: [name] Treating Becker Christopher/Extender: [name], [name] in Treatment: 3 Vital Signs Time Taken: 08:30 Temperature (F): 98.5 Height (in): 58 Pulse (bpm): 68 Weight (lbs): 203 Respiratory Rate (breaths/min): 18 Body Mass Index (BMI): 42.4 Blood Pressure (mmHg): 120/73 Reference Range: 80 - 120 mg / dl Electronic Signature(s) Signed: 07/11/2019 1:46:59 PM By: [name] Entered By: [name] on 07/11/2019 08:31:13

## 2019-07-18 ENCOUNTER — Ambulatory Visit: Payer: Medicare Other | Admitting: Physician Assistant

## 2019-07-21 ENCOUNTER — Encounter: Payer: Medicare Other | Admitting: Physician Assistant

## 2019-07-21 ENCOUNTER — Other Ambulatory Visit: Payer: Self-pay

## 2019-07-21 DIAGNOSIS — L98499 Non-pressure chronic ulcer of skin of other sites with unspecified severity: Secondary | ICD-10-CM | POA: Diagnosis not present

## 2019-07-21 NOTE — Progress Notes (Addendum)
[name], [name] ([id number]) Visit Report for 07/21/2019 Arrival Information Details Patient Name: [name], [name]. Date of Service: 07/21/2019 3:00 PM Medical Record Number: [id number] Patient Account Number: [id number] Date of Birth/Sex: [birth year] (73 y.o. F) Treating [name]: [name] Primary Care [name]: [name] Other Clinician: Referring Kare Dado: [name] Treating [name]/Extender: [name], [name]: 4 Visit Information History Since Last Visit Added or deleted any medications: No Patient Arrived: Wheel Chair Any new allergies or adverse reactions: No Arrival Time: 15:18 Had a fall or experienced change in No activities of daily living that may affect Accompanied By: self risk of falls: Transfer Assistance: None Signs or symptoms of abuse/neglect since last visito No Patient Identification Verified: Yes Hospitalized since last visit: No Secondary Verification Process Completed: Yes Implantable device outside of the clinic excluding No Patient Requires Transmission-Based No cellular tissue based products placed in the center Precautions: since last visit: Patient Has Alerts: No Pain Present Now: No Electronic Signature(s) Signed: 07/21/2019 4:50:52 PM By: [name] Entered By: [name], BSN, [name], CWS, [name] on 07/21/2019 15:20:30 [name] -------------------------------------------------------------------------------- Clinic Level of Care Assessment Details Patient Name: [name], [name]. Date of Service: 07/21/2019 3:00 PM Medical Record Number: [id number] Patient Account Number: [id number] Date of Birth/Sex: October 15, 1946 (73 y.o. F) Treating [name]: [name] Primary Care [name]: [name] Other Clinician: Referring [name]: [name] Treating [name]/Extender: [name], [name]: 4 Clinic Level of Care Assessment Items TOOL 4 Quantity Score []  - Use when only an EandM is  performed on FOLLOW-UP visit 0 ASSESSMENTS - Nursing Assessment / Reassessment X - Reassessment of Co-morbidities (includes updates in patient status) 1 10 X- 1 5 Reassessment of Adherence to [name] Plan ASSESSMENTS - Wound and Skin Assessment / Reassessment X - Simple Wound Assessment / Reassessment - one wound 1 5 []  - 0 Complex Wound Assessment / Reassessment - multiple wounds []  - 0 Dermatologic / Skin Assessment (not related to wound area) ASSESSMENTS - Focused Assessment []  - Circumferential Edema Measurements - multi extremities 0 []  - 0 Nutritional Assessment / Counseling / Intervention []  - 0 Lower Extremity Assessment (monofilament, tuning fork, pulses) []  - 0 Peripheral Arterial Disease Assessment (using hand held doppler) ASSESSMENTS - Ostomy and/or Continence Assessment and Care []  - Incontinence Assessment and Management 0 []  - 0 Ostomy Care Assessment and Management (repouching, etc.) PROCESS - Coordination of Care X - Simple Patient / Family Education for ongoing care 1 15 []  - 0 Complex (extensive) Patient / Family Education for ongoing care []  - 0 Staff obtains Programmer, systems, Records, Test Results / Process Orders []  - 0 Staff telephones HHA, Nursing Homes / Clarify orders / etc []  - 0 Routine Transfer to another Facility (non-emergent condition) []  - 0 Routine Hospital Admission (non-emergent condition) []  - 0 New Admissions / Biomedical engineer / Ordering NPWT, Apligraf, etc. []  - 0 Emergency Hospital Admission (emergent condition) X- 1 10 Simple Discharge Coordination [name], [name] ([id number]) []  - 0 Complex (extensive) Discharge Coordination PROCESS - Special Needs []  - Pediatric / Minor Patient Management 0 []  - 0 Isolation Patient Management []  - 0 Hearing / Language / Visual special needs []  - 0 Assessment of Community assistance (transportation, D/C planning, etc.) []  - 0 Additional assistance / Altered mentation []  - 0 Support  Surface(s) Assessment (bed, cushion, seat, etc.) INTERVENTIONS - Wound Cleansing / Measurement X - Simple Wound Cleansing - one wound 1 5 []  - 0  Complex Wound Cleansing - multiple wounds X- 1 5 Wound Imaging (photographs - any number of wounds) []  - 0 Wound Tracing (instead of photographs) X- 1 5 Simple Wound Measurement - one wound []  - 0 Complex Wound Measurement - multiple wounds INTERVENTIONS - Wound Dressings []  - Small Wound Dressing one or multiple wounds 0 []  - 0 Medium Wound Dressing one or multiple wounds []  - 0 Large Wound Dressing one or multiple wounds []  - 0 Application of Medications - topical []  - 0 Application of Medications - injection INTERVENTIONS - Miscellaneous []  - External ear exam 0 []  - 0 Specimen Collection (cultures, biopsies, blood, body fluids, etc.) []  - 0 Specimen(s) / Culture(s) sent or taken to Lab for analysis []  - 0 Patient Transfer (multiple staff / Civil Service fast streamer / Similar devices) []  - 0 Simple Staple / Suture removal (25 or less) []  - 0 Complex Staple / Suture removal (26 or more) []  - 0 Hypo / Hyperglycemic Management (close monitor of Blood Glucose) []  - 0 Ankle / Brachial Index (ABI) - do not check if billed separately X- 1 5 Vital Signs [name], [name] ([id number]) Has the patient been seen at the hospital within the last three years: Yes Total Score: 65 Level Of Care: New/Established - Level 2 Electronic Signature(s) Signed: 07/21/2019 4:36:39 PM By: [name] Entered By: [name] on 07/21/2019 15:56:49 [name] -------------------------------------------------------------------------------- Lower Extremity Assessment Details Patient Name: Breanna Cleverly H. Date of Service: 07/21/2019 3:00 PM Medical Record Number: [id number] Patient Account Number: [id number] Date of Birth/Sex: Dec 06, 1945 (73 y.o. F) Treating [name]: [name] Primary Care [name]: [name] Other Clinician: Referring  [name]: [name] Treating [name]/Extender: [name] Ink in [name]: 4 Electronic Signature(s) Signed: 07/21/2019 4:50:52 PM By: [name] Entered By: [name], BSN, [name], CWS, [name] on 07/21/2019 15:25:50 [name], [name] ([id number]) -------------------------------------------------------------------------------- Multi Wound Chart Details Patient Name: [name]. Date of Service: 07/21/2019 3:00 PM Medical Record Number: [id number] Patient Account Number: [id number] Date of Birth/Sex: [birth year] (73 y.o. F) Treating [name]: [name] Primary Care Rory Xiang: [name] Other Clinician: Referring Sirenia Whitis: [name] Treating Ketura Sirek/Extender: [name], [name]: 4 Vital Signs Height(in): 58 Pulse(bpm): 77 Weight(lbs): 203 Blood Pressure(mmHg): 128/92 Body Mass Index(BMI): 42 Temperature(F): 98.6 Respiratory Rate 16 (breaths/min): Photos: [N/A:N/A] Wound Location: Right Abdomen - Lower N/A N/A Quadrant Wounding Event: Gradually Appeared N/A N/A Primary Etiology: MASD N/A N/A Comorbid History: Anemia, Asthma, Chronic N/A N/A Obstructive Pulmonary Disease (COPD), Hypertension, Osteoarthritis, Neuropathy Date Acquired: 05/14/2019 N/A N/A Weeks of [name]: 4 N/A N/A Wound Status: Healed - Epithelialized N/A N/A Measurements L x W x D 0x0x0 N/A N/A (cm) Area (cm) : 0 N/A N/A Volume (cm) : 0 N/A N/A % Reduction in Area: 100.00% N/A N/A % Reduction in Volume: 100.00% N/A N/A Classification: Full Thickness Without N/A N/A Exposed Support Structures Exudate Amount: Medium N/A N/A Exudate Type: Serous N/A N/A Exudate Color: amber N/A N/A Wound Margin: Flat and Intact N/A N/A Granulation Amount: Medium (34-66%) N/A N/A Granulation Quality: Pink N/A N/A Necrotic Amount: Medium (34-66%) N/A N/A Exposed Structures: Fat Layer (Subcutaneous N/A N/A Tissue) Exposed: Yes Cowin, Henli H. ([id number]) Fascia:  No Tendon: No Muscle: No Joint: No Bone: No Epithelialization: None N/A N/A [name] Notes Electronic Signature(s) Signed: 07/21/2019 4:36:39 PM By: [name] Entered By: [name] on 07/21/2019 15:55:35 [name], [name] ([id number]) -------------------------------------------------------------------------------- Multi-Disciplinary Care Plan Details Patient Name: [name].  Date of Service: 07/21/2019 3:00 PM Medical Record Number: [id number] Patient Account Number: [id number] Date of Birth/Sex: February 06, 1946 (73 y.o. F) Treating [name]: [name] Primary Care Chere Babson: [name] Other Clinician: Referring Armin Yerger: [name] Treating Valoria Tamburri/Extender: [name], [name]: 4 Active Inactive Electronic Signature(s) Signed: 07/21/2019 4:05:20 PM By: [name] Entered By: [name] on 07/21/2019 16:05:20 [name], [name] ([id number]) -------------------------------------------------------------------------------- Pain Assessment Details Patient Name: Breanna Cleverly H. Date of Service: 07/21/2019 3:00 PM Medical Record Number: [id number] Patient Account Number: [id number] Date of Birth/Sex: 1946/09/03 (73 y.o. F) Treating [name]: [name] Primary Care Josue Falconi: [name] Other Clinician: Referring Rodrigues Urbanek: [name] Treating Donald Memoli/Extender: [name], [name]: 4 Active Problems Location of Pain Severity and Description of Pain Patient Has Paino No Site Locations Pain Management and Medication Current Pain Management: Electronic Signature(s) Signed: 07/21/2019 4:50:52 PM By: [name] Entered By: [name], BSN, [name], CWS, [name] on 07/21/2019 15:20:38 [name], [name] ([id number]) -------------------------------------------------------------------------------- Patient/Caregiver Education Details Patient Name: [name]. Date of Service: 07/21/2019 3:00 PM Medical Record Number:  [id number] Patient Account Number: [id number] Date of Birth/Gender: 1946/04/25 (73 y.o. F) Treating [name]: [name] Primary Care Physician: [name] Other Clinician: Referring Physician: Harrel [name] Treating Physician/Extender: [name] Ink in [name]: 4 Education Assessment Education Provided To: Patient Education Topics Provided Wound/Skin Impairment: Handouts: Caring for Your Ulcer Methods: Demonstration, Explain/Verbal Responses: State content correctly Electronic Signature(s) Signed: 07/21/2019 4:36:39 PM By: [name] Entered By: [name] on 07/21/2019 15:55:51 [name], [name] ([id number]) -------------------------------------------------------------------------------- Wound Assessment Details Patient Name: Breanna Cleverly H. Date of Service: 07/21/2019 3:00 PM Medical Record Number: [id number] Patient Account Number: [id number] Date of Birth/Sex: 10-Apr-1946 (73 y.o. F) Treating [name]: [name] Primary Care Zadia Uhde: [name] Other Clinician: Referring Dariona Postma: [name] Treating Brevan Luberto/Extender: [name], [name]: 4 Wound Status Wound Number: 1 Primary MASD Etiology: Wound Location: Right Abdomen - Lower Quadrant Wound Healed - Epithelialized Wounding Event: Gradually Appeared Status: Date Acquired: 05/14/2019 Comorbid Anemia, Asthma, Chronic Obstructive Weeks Of [name]: 4 History: Pulmonary Disease (COPD), Hypertension, Clustered Wound: No Osteoarthritis, Neuropathy Photos Wound Measurements Length: (cm) 0 % Reduc Width: (cm) 0 % Reduc Depth: (cm) 0 Epithel Area: (cm) 0 Tunnel Volume: (cm) 0 Underm tion in Area: 100% tion in Volume: 100% ialization: None ing: No ining: No Wound Description Full Thickness Without Exposed Support Foul Od Classification: Structures Slough/ Wound Margin: Flat and Intact Exudate Medium Amount: Exudate Type: Serous Exudate Color: amber or After  Cleansing: No Fibrino Yes Wound Bed Granulation Amount: Medium (34-66%) Exposed Structure Granulation Quality: Pink Fascia Exposed: No Necrotic Amount: Medium (34-66%) Fat Layer (Subcutaneous Tissue) Exposed: Yes Necrotic Quality: Adherent Slough Tendon Exposed: No Muscle Exposed: No Joint Exposed: No Bone Exposed: No [name], [name] Kitchen ([id number]) Electronic Signature(s) Signed: 07/21/2019 4:36:39 PM By: [name] Entered By: [name] on 07/21/2019 15:54:49 [name], [name] ([id number]) -------------------------------------------------------------------------------- Vitals Details Patient Name: Breanna Cleverly H. Date of Service: 07/21/2019 3:00 PM Medical Record Number: [id number] Patient Account Number: [id number] Date of Birth/Sex: December 09, 1945 (73 y.o. F) Treating [name]: [name] Primary Care Ruxin Ransome: [name] Other Clinician: Referring Pearlene Teat: [name] Treating Kamesha Herne/Extender: [name], [name]: 4 Vital Signs Time Taken: 15:15 Temperature (F): 98.6 Height (in): 58 Pulse (bpm): 77 Weight (lbs): 203 Respiratory Rate (breaths/min): 16 Body Mass Index (BMI): 42.4 Blood Pressure (mmHg): 128/92 Reference Range: 80 - 120 mg / dl Electronic Signature(s) Signed:  07/21/2019 4:50:52 PM By: [name] Entered By: [name], BSN, [name], CWS, [name] on 07/21/2019 15:21:06

## 2019-07-21 NOTE — Progress Notes (Addendum)
MITA, VALLO ([id number]) Visit Report for 07/21/2019 Chief Complaint Document Details Patient Name: [name], [name]. Date of Service: 07/21/2019 3:00 PM Medical Record Number: [id number] Patient Account Number: [id number] Date of Birth/Sex: December 18, 1945 (73 y.o. F) Treating RN: Harold Barban Primary Care Provider: Harrel Lemon Other Clinician: Referring Provider: Harrel Lemon Treating Provider/Extender: Melburn Hake, Shontez Sermon Weeks in Treatment: 4 Information Obtained from: Patient Chief Complaint Right Lower Quadrant Abdominal Ulcer Electronic Signature(s) Signed: 07/21/2019 3:00:04 PM By: [name] PA-C Entered By: [name] on 07/21/2019 15:00:03 Corado, [name] ([id number]) -------------------------------------------------------------------------------- HPI Details Patient Name: [name]. Date of Service: 07/21/2019 3:00 PM Medical Record Number: [id number] Patient Account Number: [id number] Date of Birth/Sex: 1946-08-08 (73 y.o. F) Treating RN: Harold Barban Primary Care Provider: Harrel Lemon Other Clinician: Referring Provider: Harrel Lemon Treating Provider/Extender: Melburn Hake, Hildagarde Holleran Weeks in Treatment: 4 History of Present Illness HPI Description: 06/20/2019 on evaluation today patient presents for initial inspection here in our office concerning issue that has been going on for the past several weeks with her moisture associated breakdown in the right lower quadrant of the abdominal region underneath her pannus. Subsequently she is not having any pain which is at least good news. Her primary care provider did give her a prescription for nystatin powder as well as doxycycline this ends on the 28th as far as doxycycline is concerned that is just 1 week away. Nonetheless I do not see any evidence of active infection at this time which is good news. With that being said the wound itself does appear to be in a very precarious location right at the crease which  will make it more difficult to heal secondary to moisture issues. Nonetheless I think that potentially an alginate dressing could be of benefit here. No fevers, chills, nausea, vomiting, or diarrhea. She does have a history of COPD, hypertension, obesity, and more recently in this area a tinea corporis infection. 06/27/2019 on evaluation today patient actually appears to be doing I believe okay with regard to her wound. There is much less irritation today compared to the last visit I feel like the nystatin powder has helped in that regard also feel like she is much less macerated than previously noted. With that being said there is a slight hint of new skin growing around the edges of the wound which is good news obviously this is just can take some time. 07/04/19 on evaluation today patient appears to be doing well with regard to her right lower quadrant abdominal ulcer. She is making some progress albeit very slow. There does not appear to be any fungal/yeast infection at this time which is good news. With that being said there does appear to be slow epithelialization around the edge of the wound. The wound bed itself is not quite as healthy as I would like to see though no slough there just really is not a lot of good granulation tissue there is a lot of what appears to be scar tissue which obviously is much harder to get new skin to grow over. I think she may benefit from switching to [facility] dressing to see if this could be helpful in speeding up the healing process. 07/11/2019 on evaluation today patient actually appears to be making excellent progress in my opinion with regard to her abdominal wall ulcer. She has been tolerating the dressing changes without complication. Fortunately there is no signs of active infection at this time. No fevers, chills, nausea, vomiting, or diarrhea. 07/21/2019  on evaluation today patient actually appears to be doing quite well with regard to her abdominal  ulcer. In fact I did have a close look at this today and it appears to be completely healed. Fortunately there is no signs of active infection at this time and overall she has been doing quite well. The patient is very glad to hear this and really has not had any significant drainage at this time. Electronic Signature(s) Signed: 07/21/2019 4:32:27 PM By: [name] PA-C Entered By: [name] on 07/21/2019 16:32:27 Peet, [name] ([id number]) -------------------------------------------------------------------------------- Physical Exam Details Patient Name: Breanna Cleverly H. Date of Service: 07/21/2019 3:00 PM Medical Record Number: [id number] Patient Account Number: [id number] Date of Birth/Sex: 01/26/1946 (73 y.o. F) Treating RN: Harold Barban Primary Care Provider: Harrel Lemon Other Clinician: Referring Provider: Harrel Lemon Treating Provider/Extender: Melburn Hake, Waddell Iten Weeks in Treatment: 4 Constitutional Well-nourished and well-hydrated in no acute distress. Respiratory normal breathing without difficulty. Psychiatric this patient is able to make decisions and demonstrates good insight into disease process. Alert and Oriented x 3. pleasant and cooperative. Notes Patient's wound bed upon very close inspection did not appear to show any signs of opening at this time and in fact the wound appears to be completely healed which is great news. Fortunately there is no signs of infection I do believe we need to keep this area very clean and dry and this was discussed with the patient still. I want allow for the new skin that is there to toughen up. Electronic Signature(s) Signed: 07/21/2019 4:32:57 PM By: [name] PA-C Entered By: [name] on 07/21/2019 16:32:57 Esquivias, [name] ([id number]) -------------------------------------------------------------------------------- Physician Orders Details Patient Name: [name]. Date of Service: 07/21/2019 3:00  PM Medical Record Number: [id number] Patient Account Number: [id number] Date of Birth/Sex: 04-Oct-1946 (73 y.o. F) Treating RN: Harold Barban Primary Care Provider: Harrel Lemon Other Clinician: Referring Provider: Harrel Lemon Treating Provider/Extender: Melburn Hake, Jerine Surles Weeks in Treatment: 4 Verbal / Phone Orders: No Diagnosis Coding ICD-10 Coding Code Description L98.9 Disorder of the skin and subcutaneous tissue, unspecified B35.4 Tinea corporis Unspecified open wound of abdominal wall, right lower quadrant without penetration into peritoneal S31.103A cavity, initial encounter I10 Essential (primary) hypertension E66.01 Morbid (severe) obesity due to excess calories J44.9 Chronic obstructive pulmonary disease, unspecified Home Health o D/C [place] Discharge From [facility] Services o Discharge from [place] Signature(s) Signed: 07/21/2019 4:36:39 PM By: Harold Barban Signed: 07/21/2019 4:43:35 PM By: [name] PA-C Entered By: Harold Barban on 07/21/2019 16:01:47 Dahir, [name] ([id number]) -------------------------------------------------------------------------------- Problem List Details Patient Name: Breanna Cleverly H. Date of Service: 07/21/2019 3:00 PM Medical Record Number: [id number] Patient Account Number: [id number] Date of Birth/Sex: 1946-09-15 (73 y.o. F) Treating RN: Harold Barban Primary Care Provider: Harrel Lemon Other Clinician: Referring Provider: Harrel Lemon Treating Provider/Extender: Melburn Hake, Kimiya Brunelle Weeks in Treatment: 4 Active Problems ICD-10 Evaluated Encounter Code Description Active Date Today Diagnosis L98.9 Disorder of the skin and subcutaneous tissue, unspecified 06/20/2019 No Yes B35.4 Tinea corporis 06/20/2019 No Yes S31.103A Unspecified open wound of abdominal wall, right lower 06/20/2019 No Yes quadrant without penetration into peritoneal cavity, initial encounter I10 Essential (primary)  hypertension 06/20/2019 No Yes E66.01 Morbid (severe) obesity due to excess calories 06/20/2019 No Yes J44.9 Chronic obstructive pulmonary disease, unspecified 06/20/2019 No Yes Inactive Problems Resolved Problems Electronic Signature(s) Signed: 07/21/2019 2:59:57 PM By: [name] PA-C Entered By: [name] on 07/21/2019 14:59:57 Mcmeans, Katharine Look  Lemmie Evens ([id number]) -------------------------------------------------------------------------------- Progress Note Details Patient Name: Raburn, Ellice H. Date of Service: 07/21/2019 3:00 PM Medical Record Number: [id number] Patient Account Number: [id number] Date of Birth/Sex: Oct 02, 1946 (73 y.o. F) Treating RN: Harold Barban Primary Care Provider: Harrel Lemon Other Clinician: Referring Provider: Harrel Lemon Treating Provider/Extender: Melburn Hake, Ameerah Huffstetler Weeks in Treatment: 4 Subjective Chief Complaint Information obtained from Patient Right Lower Quadrant Abdominal Ulcer History of Present Illness (HPI) 06/20/2019 on evaluation today patient presents for initial inspection here in our office concerning issue that has been going on for the past several weeks with her moisture associated breakdown in the right lower quadrant of the abdominal region underneath her pannus. Subsequently she is not having any pain which is at least good news. Her primary care provider did give her a prescription for nystatin powder as well as doxycycline this ends on the 28th as far as doxycycline is concerned that is just 1 week away. Nonetheless I do not see any evidence of active infection at this time which is good news. With that being said the wound itself does appear to be in a very precarious location right at the crease which will make it more difficult to heal secondary to moisture issues. Nonetheless I think that potentially an alginate dressing could be of benefit here. No fevers, chills, nausea, vomiting, or diarrhea. She does have a history of COPD,  hypertension, obesity, and more recently in this area a tinea corporis infection. 06/27/2019 on evaluation today patient actually appears to be doing I believe okay with regard to her wound. There is much less irritation today compared to the last visit I feel like the nystatin powder has helped in that regard also feel like she is much less macerated than previously noted. With that being said there is a slight hint of new skin growing around the edges of the wound which is good news obviously this is just can take some time. 07/04/19 on evaluation today patient appears to be doing well with regard to her right lower quadrant abdominal ulcer. She is making some progress albeit very slow. There does not appear to be any fungal/yeast infection at this time which is good news. With that being said there does appear to be slow epithelialization around the edge of the wound. The wound bed itself is not quite as healthy as I would like to see though no slough there just really is not a lot of good granulation tissue there is a lot of what appears to be scar tissue which obviously is much harder to get new skin to grow over. I think she may benefit from switching to [facility] dressing to see if this could be helpful in speeding up the healing process. 07/11/2019 on evaluation today patient actually appears to be making excellent progress in my opinion with regard to her abdominal wall ulcer. She has been tolerating the dressing changes without complication. Fortunately there is no signs of active infection at this time. No fevers, chills, nausea, vomiting, or diarrhea. 07/21/2019 on evaluation today patient actually appears to be doing quite well with regard to her abdominal ulcer. In fact I did have a close look at this today and it appears to be completely healed. Fortunately there is no signs of active infection at this time and overall she has been doing quite well. The patient is very glad to hear  this and really has not had any significant drainage at this time. Patient History Information obtained from Patient. Family History  Cancer - Child, Diabetes - Mother, Heart Disease - Child, Hypertension - Child, Thyroid Problems - Child, No family history of Hereditary Spherocytosis, Kidney Disease, Lung Disease, Seizures, Stroke, Tuberculosis. Social History Former smoker - 40 years - ended on 05/30/2016, Marital Status - Married, Alcohol Use - Never, Drug Use - No History, Swatek, Pamella H. ([id number]) Caffeine Use - Daily - soda. Medical History Eyes Denies history of Cataracts, Glaucoma, Optic Neuritis Hematologic/Lymphatic Patient has history of Anemia Denies history of Hemophilia, Human Immunodeficiency Virus, Lymphedema, Sickle Cell Disease Respiratory Patient has history of Asthma, Chronic Obstructive Pulmonary Disease (COPD) Denies history of Pneumothorax, Sleep Apnea, Tuberculosis Cardiovascular Patient has history of Hypertension Denies history of Angina, Arrhythmia, Congestive Heart Failure, Coronary Artery Disease, Deep Vein Thrombosis, Hypotension, Myocardial Infarction, Peripheral Arterial Disease, Peripheral Venous Disease, Phlebitis, Vasculitis Gastrointestinal Denies history of Cirrhosis , Colitis, Crohn s, Hepatitis A, Hepatitis B, Hepatitis C Endocrine Denies history of Type I Diabetes, Type II Diabetes Genitourinary Denies history of End Stage Renal Disease Immunological Denies history of Lupus Erythematosus, Raynaud s, Scleroderma Integumentary (Skin) Denies history of History of Burn, History of pressure wounds Musculoskeletal Patient has history of Osteoarthritis Denies history of Gout, Rheumatoid Arthritis, Osteomyelitis Neurologic Patient has history of Neuropathy Denies history of Dementia, Quadriplegia, Paraplegia, Seizure Disorder Psychiatric Denies history of Anorexia/bulimia, Confinement Anxiety Review of Systems (ROS) Constitutional Symptoms  (General Health) Denies complaints or symptoms of Fatigue, Fever, Chills, Marked Weight Change. Respiratory Denies complaints or symptoms of Chronic or frequent coughs, Shortness of Breath. Cardiovascular Denies complaints or symptoms of Chest pain, LE edema. Psychiatric Denies complaints or symptoms of Anxiety, Claustrophobia. Objective Constitutional Well-nourished and well-hydrated in no acute distress. Vitals Time Taken: 3:15 PM, Height: 58 in, Weight: 203 lbs, BMI: 42.4, Temperature: 98.6 F, Pulse: 77 bpm, Respiratory Rate: 16 breaths/min, Blood Pressure: 128/92 mmHg. Mark, Aditri H. ([id number]) Respiratory normal breathing without difficulty. Psychiatric this patient is able to make decisions and demonstrates good insight into disease process. Alert and Oriented x 3. pleasant and cooperative. General Notes: Patient's wound bed upon very close inspection did not appear to show any signs of opening at this time and in fact the wound appears to be completely healed which is great news. Fortunately there is no signs of infection I do believe we need to keep this area very clean and dry and this was discussed with the patient still. I want allow for the new skin that is there to toughen up. Integumentary (Hair, Skin) Wound #1 status is Healed - Epithelialized. Original cause of wound was Gradually Appeared. The wound is located on the Right Abdomen - Lower Quadrant. The wound measures 0cm length x 0cm width x 0cm depth; 0cm^2 area and 0cm^3 volume. There is Fat Layer (Subcutaneous Tissue) Exposed exposed. There is no tunneling or undermining noted. There is a medium amount of serous drainage noted. The wound margin is flat and intact. There is medium (34-66%) pink granulation within the wound bed. There is a medium (34-66%) amount of necrotic tissue within the wound bed including Adherent Slough. Assessment Active Problems ICD-10 Disorder of the skin and subcutaneous tissue,  unspecified Tinea corporis Unspecified open wound of abdominal wall, right lower quadrant without penetration into peritoneal cavity, initial encounter Essential (primary) hypertension Morbid (severe) obesity due to excess calories Chronic obstructive pulmonary disease, unspecified Plan Home Health: D/C Home Health Services Discharge From [facility] Services: Discharge from [place] 1. I would recommend currently that we go ahead and continue with the protection  for this area this will include using some baby powder as well as an ABD pad for absorption purposes. 2. I would go ahead and discontinue wound care services as well as home health as the patient appears to be doing very well at this time. DALYCE, RENNE ([id number]) Patient will follow-up as needed if anything changes or worsens. Electronic Signature(s) Signed: 07/21/2019 4:33:27 PM By: [name] PA-C Entered By: [name] on 07/21/2019 16:33:27 Hamill, [name] ([id number]) -------------------------------------------------------------------------------- ROS/PFSH Details Patient Name: [name]. Date of Service: 07/21/2019 3:00 PM Medical Record Number: [id number] Patient Account Number: [id number] Date of Birth/Sex: 22-Jan-1946 (73 y.o. F) Treating RN: Harold Barban Primary Care Provider: Harrel Lemon Other Clinician: Referring Provider: Harrel Lemon Treating Provider/Extender: Melburn Hake, Twinkle Sockwell Weeks in Treatment: 4 Information Obtained From Patient Constitutional Symptoms (General Health) Complaints and Symptoms: Negative for: Fatigue; Fever; Chills; Marked Weight Change Respiratory Complaints and Symptoms: Negative for: Chronic or frequent coughs; Shortness of Breath Medical History: Positive for: Asthma; Chronic Obstructive Pulmonary Disease (COPD) Negative for: Pneumothorax; Sleep Apnea; Tuberculosis Cardiovascular Complaints and Symptoms: Negative for: Chest pain; LE edema Medical  History: Positive for: Hypertension Negative for: Angina; Arrhythmia; Congestive Heart Failure; Coronary Artery Disease; Deep Vein Thrombosis; Hypotension; Myocardial Infarction; Peripheral Arterial Disease; Peripheral Venous Disease; Phlebitis; Vasculitis Psychiatric Complaints and Symptoms: Negative for: Anxiety; Claustrophobia Medical History: Negative for: Anorexia/bulimia; Confinement Anxiety Eyes Medical History: Negative for: Cataracts; Glaucoma; Optic Neuritis Hematologic/Lymphatic Medical History: Positive for: Anemia Negative for: Hemophilia; Human Immunodeficiency Virus; Lymphedema; Sickle Cell Disease Gastrointestinal Medical History: Negative for: Cirrhosis ; Colitis; Crohnos; Hepatitis A; Hepatitis B; Hepatitis C Tourigny, Cristalle H. ([id number]) Endocrine Medical History: Negative for: Type I Diabetes; Type II Diabetes Genitourinary Medical History: Negative for: End Stage Renal Disease Immunological Medical History: Negative for: Lupus Erythematosus; Raynaudos; Scleroderma Integumentary (Skin) Medical History: Negative for: History of Burn; History of pressure wounds Musculoskeletal Medical History: Positive for: Osteoarthritis Negative for: Gout; Rheumatoid Arthritis; Osteomyelitis Neurologic Medical History: Positive for: Neuropathy Negative for: Dementia; Quadriplegia; Paraplegia; Seizure Disorder Immunizations Pneumococcal Vaccine: Received Pneumococcal Vaccination: No Tetanus Vaccine: Last tetanus shot: 02/27/2018 Implantable Devices None Family and Social History Cancer: Yes - Child; Diabetes: Yes - Mother; Heart Disease: Yes - Child; Hereditary Spherocytosis: No; Hypertension: Yes - Child; Kidney Disease: No; Lung Disease: No; Seizures: No; Stroke: No; Thyroid Problems: Yes - Child; Tuberculosis: No; Former smoker - 40 years - ended on 05/30/2016; Marital Status - Married; Alcohol Use: Never; Drug Use: No History; Caffeine Use: Daily - soda; Financial  Concerns: No; Food, Clothing or Shelter Needs: No; Support System Lacking: No; Transportation Concerns: No Physician Affirmation I have reviewed and agree with the above information. Electronic Signature(s) Signed: 07/21/2019 4:36:39 PM By: Harold Barban Signed: 07/21/2019 4:43:35 PM By: [name] PA-C Entered By: [name] on 07/21/2019 16:32:41 [place], [name] ([id number]) -------------------------------------------------------------------------------- SuperBill Details Patient Name: [name]. Date of Service: 07/21/2019 Medical Record Number: [id number] Patient Account Number: [id number] Date of Birth/Sex: [birth year] (73 y.o. F) Treating RN: Harold Barban Primary Care Provider: Harrel Lemon Other Clinician: Referring Provider: Harrel Lemon Treating Provider/Extender: Melburn Hake, Yitzel Shasteen Weeks in Treatment: 4 Diagnosis Coding ICD-10 Codes Code Description L98.9 Disorder of the skin and subcutaneous tissue, unspecified B35.4 Tinea corporis Unspecified open wound of abdominal wall, right lower quadrant without penetration into peritoneal S31.103A cavity, initial encounter I10 Essential (primary) hypertension E66.01 Morbid (severe) obesity due to excess calories J44.9 Chronic obstructive pulmonary disease, unspecified Facility Procedures CPT4 Code: [id number] Description:  99212 - WOUND CARE VISIT-LEV 2 EST PT Modifier: Quantity: 1 Physician Procedures CPT4: Description Modifier Quantity Code 8329191 66060 - WC PHYS LEVEL 3 - EST PT 1 ICD-10 Diagnosis Description L98.9 Disorder of the skin and subcutaneous tissue, unspecified B35.4 Tinea corporis S31.103A Unspecified open wound of abdominal wall, right  lower quadrant without penetration into peritoneal cavity, initial encounter I10 Essential (primary) hypertension Electronic Signature(s) Signed: 07/21/2019 4:33:40 PM By: [name] PA-C Entered By: [name] on 07/21/2019 16:33:40

## 2020-01-23 ENCOUNTER — Ambulatory Visit: Payer: Medicare Other | Attending: Internal Medicine

## 2020-01-23 ENCOUNTER — Other Ambulatory Visit: Payer: Self-pay

## 2020-01-23 DIAGNOSIS — Z23 Encounter for immunization: Secondary | ICD-10-CM

## 2020-01-23 NOTE — Progress Notes (Signed)
   Covid-19 Vaccination Clinic  Name:  [name]    MRN: [id number] DOB: 1946-01-13  01/23/2020  [name] was observed post Covid-19 immunization for 15 minutes without incident. She was provided with Vaccine Information Sheet and instruction to access the V-Safe system.   [name] was instructed to call 911 with any severe reactions post vaccine: Marland Kitchen Difficulty breathing  . Swelling of face and throat  . A fast heartbeat  . A bad rash all over body  . Dizziness and weakness   Immunizations Administered    Name Date Dose VIS Date Route   Pfizer COVID-19 Vaccine 01/23/2020  8:59 AM 0.3 mL 10/10/2019 Intramuscular   Manufacturer: [employer], [employer]   Lot: [id number]   [place]: [address]-7

## 2020-02-17 ENCOUNTER — Ambulatory Visit: Payer: Medicare Other | Attending: Internal Medicine

## 2020-02-17 DIAGNOSIS — Z23 Encounter for immunization: Secondary | ICD-10-CM

## 2020-02-17 NOTE — Progress Notes (Signed)
   Covid-19 Vaccination Clinic  Name:  [name]    MRN: [id number] DOB: 01/13/1946  02/17/2020  [name] was observed post Covid-19 immunization for 15 minutes without incident. She was provided with Vaccine Information Sheet and instruction to access the V-Safe system.   [name] was instructed to call 911 with any severe reactions post vaccine: Marland Kitchen Difficulty breathing  . Swelling of face and throat  . A fast heartbeat  . A bad rash all over body  . Dizziness and weakness   Immunizations Administered    Name Date Dose VIS Date Route   Pfizer COVID-19 Vaccine 02/17/2020  9:22 AM 0.3 mL 12/24/2018 Intramuscular   Manufacturer: [employer], [employer]   Lot: [id number]   [place]: [address]-2

## 2020-03-19 ENCOUNTER — Encounter: Payer: Self-pay | Admitting: Emergency Medicine

## 2020-03-19 ENCOUNTER — Other Ambulatory Visit: Payer: Self-pay

## 2020-03-19 ENCOUNTER — Emergency Department: Payer: Medicare Other

## 2020-03-19 ENCOUNTER — Inpatient Hospital Stay
Admission: EM | Admit: 2020-03-19 | Discharge: 2020-03-22 | DRG: 871 | Disposition: A | Discharge status: Home / Self Care | Payer: Medicare Other | Attending: Internal Medicine | Admitting: Internal Medicine

## 2020-03-19 DIAGNOSIS — J9601 Acute respiratory failure with hypoxia: Secondary | ICD-10-CM | POA: Diagnosis present

## 2020-03-19 DIAGNOSIS — Z20822 Contact with and (suspected) exposure to covid-19: Secondary | ICD-10-CM | POA: Diagnosis present

## 2020-03-19 DIAGNOSIS — G2581 Restless legs syndrome: Secondary | ICD-10-CM | POA: Diagnosis present

## 2020-03-19 DIAGNOSIS — A419 Sepsis, unspecified organism: Secondary | ICD-10-CM | POA: Diagnosis present

## 2020-03-19 DIAGNOSIS — M810 Age-related osteoporosis without current pathological fracture: Secondary | ICD-10-CM | POA: Diagnosis present

## 2020-03-19 DIAGNOSIS — Z833 Family history of diabetes mellitus: Secondary | ICD-10-CM

## 2020-03-19 DIAGNOSIS — K449 Diaphragmatic hernia without obstruction or gangrene: Secondary | ICD-10-CM | POA: Diagnosis present

## 2020-03-19 DIAGNOSIS — R339 Retention of urine, unspecified: Secondary | ICD-10-CM | POA: Diagnosis not present

## 2020-03-19 DIAGNOSIS — Z96612 Presence of left artificial shoulder joint: Secondary | ICD-10-CM | POA: Diagnosis present

## 2020-03-19 DIAGNOSIS — E039 Hypothyroidism, unspecified: Secondary | ICD-10-CM | POA: Diagnosis present

## 2020-03-19 DIAGNOSIS — M419 Scoliosis, unspecified: Secondary | ICD-10-CM | POA: Diagnosis present

## 2020-03-19 DIAGNOSIS — Z7952 Long term (current) use of systemic steroids: Secondary | ICD-10-CM

## 2020-03-19 DIAGNOSIS — J45901 Unspecified asthma with (acute) exacerbation: Secondary | ICD-10-CM | POA: Diagnosis present

## 2020-03-19 DIAGNOSIS — E079 Disorder of thyroid, unspecified: Secondary | ICD-10-CM | POA: Diagnosis present

## 2020-03-19 DIAGNOSIS — F329 Major depressive disorder, single episode, unspecified: Secondary | ICD-10-CM | POA: Diagnosis present

## 2020-03-19 DIAGNOSIS — Z79890 Hormone replacement therapy (postmenopausal): Secondary | ICD-10-CM

## 2020-03-19 DIAGNOSIS — K219 Gastro-esophageal reflux disease without esophagitis: Secondary | ICD-10-CM | POA: Diagnosis present

## 2020-03-19 DIAGNOSIS — I10 Essential (primary) hypertension: Secondary | ICD-10-CM | POA: Diagnosis present

## 2020-03-19 DIAGNOSIS — R6520 Severe sepsis without septic shock: Secondary | ICD-10-CM | POA: Diagnosis present

## 2020-03-19 DIAGNOSIS — K222 Esophageal obstruction: Secondary | ICD-10-CM | POA: Diagnosis not present

## 2020-03-19 DIAGNOSIS — E119 Type 2 diabetes mellitus without complications: Secondary | ICD-10-CM | POA: Diagnosis present

## 2020-03-19 DIAGNOSIS — J690 Pneumonitis due to inhalation of food and vomit: Secondary | ICD-10-CM | POA: Diagnosis present

## 2020-03-19 DIAGNOSIS — Z8249 Family history of ischemic heart disease and other diseases of the circulatory system: Secondary | ICD-10-CM

## 2020-03-19 DIAGNOSIS — J189 Pneumonia, unspecified organism: Secondary | ICD-10-CM

## 2020-03-19 DIAGNOSIS — E871 Hypo-osmolality and hyponatremia: Secondary | ICD-10-CM | POA: Diagnosis present

## 2020-03-19 DIAGNOSIS — J441 Chronic obstructive pulmonary disease with (acute) exacerbation: Secondary | ICD-10-CM | POA: Diagnosis present

## 2020-03-19 DIAGNOSIS — Z79899 Other long term (current) drug therapy: Secondary | ICD-10-CM

## 2020-03-19 DIAGNOSIS — R1310 Dysphagia, unspecified: Secondary | ICD-10-CM | POA: Diagnosis present

## 2020-03-19 DIAGNOSIS — Z9109 Other allergy status, other than to drugs and biological substances: Secondary | ICD-10-CM

## 2020-03-19 DIAGNOSIS — Z87891 Personal history of nicotine dependence: Secondary | ICD-10-CM

## 2020-03-19 DIAGNOSIS — Z6841 Body Mass Index (BMI) 40.0 and over, adult: Secondary | ICD-10-CM

## 2020-03-19 DIAGNOSIS — Z90710 Acquired absence of both cervix and uterus: Secondary | ICD-10-CM

## 2020-03-19 DIAGNOSIS — Z791 Long term (current) use of non-steroidal anti-inflammatories (NSAID): Secondary | ICD-10-CM

## 2020-03-19 DIAGNOSIS — E66813 Obesity, class 3: Secondary | ICD-10-CM

## 2020-03-19 DIAGNOSIS — E872 Acidosis: Secondary | ICD-10-CM | POA: Diagnosis present

## 2020-03-19 LAB — BASIC METABOLIC PANEL
Anion gap: 11 (ref 5–15)
BUN: 12 mg/dL (ref 8–23)
CO2: 28 mmol/L (ref 22–32)
Calcium: 9.2 mg/dL (ref 8.9–10.3)
Chloride: 95 mmol/L — ABNORMAL LOW (ref 98–111)
Creatinine, Ser: 0.78 mg/dL (ref 0.44–1.00)
GFR calc Af Amer: >60 mL/min (ref >60)
GFR calc non Af Amer: >60 mL/min (ref >60)
Glucose, Bld: 116 mg/dL — ABNORMAL HIGH (ref 70–99)
Potassium: 4.3 mmol/L (ref 3.5–5.1)
Sodium: 134 mmol/L — ABNORMAL LOW (ref 135–145)

## 2020-03-19 LAB — CBC
HCT: 38.1 % (ref 36.0–46.0)
Hemoglobin: 12.2 g/dL (ref 12.0–15.0)
MCH: 26.4 pg (ref 26.0–34.0)
MCHC: 32 g/dL (ref 30.0–36.0)
MCV: 82.5 fL (ref 80.0–100.0)
Platelets: 339 10*3/uL (ref 150–400)
RBC: 4.62 MIL/uL (ref 3.87–5.11)
RDW: 15.1 % (ref 11.5–15.5)
WBC: 18 10*3/uL — ABNORMAL HIGH (ref 4.0–10.5)
nRBC: 0 % (ref 0.0–0.2)

## 2020-03-19 LAB — LACTIC ACID, PLASMA: Lactic Acid, Venous: 2.5 mmol/L (ref 0.5–1.9)

## 2020-03-19 LAB — BLOOD GAS, VENOUS
Acid-Base Excess: 4.8 mmol/L — ABNORMAL HIGH (ref 0.0–2.0)
Bicarbonate: 29.9 mmol/L — ABNORMAL HIGH (ref 20.0–28.0)
O2 Saturation: 94.3 %
Patient temperature: 37
pCO2, Ven: 45 mmHg (ref 44.0–60.0)
pH, Ven: 7.43 (ref 7.250–7.430)
pO2, Ven: 70 mmHg — ABNORMAL HIGH (ref 32.0–45.0)

## 2020-03-19 LAB — APTT: aPTT: 27 seconds (ref 24–36)

## 2020-03-19 LAB — TROPONIN I (HIGH SENSITIVITY)
Troponin I (High Sensitivity): 4 ng/L (ref <18)
Troponin I (High Sensitivity): 3 ng/L (ref <18)

## 2020-03-19 LAB — SARS CORONAVIRUS 2 BY RT PCR (HOSPITAL ORDER, PERFORMED IN ~~LOC~~ HOSPITAL LAB): SARS Coronavirus 2: NEGATIVE

## 2020-03-19 LAB — BRAIN NATRIURETIC PEPTIDE: B Natriuretic Peptide: 87.8 pg/mL (ref 0.0–100.0)

## 2020-03-19 LAB — PROCALCITONIN: Procalcitonin: 0.13 ng/mL

## 2020-03-19 MED ORDER — IPRATROPIUM-ALBUTEROL 0.5-2.5 (3) MG/3ML IN SOLN
3.0000 mL | Freq: Once | RESPIRATORY_TRACT | Status: AC
  Administered 2020-03-19: 3 mL via RESPIRATORY_TRACT
  Filled 2020-03-19: qty 3

## 2020-03-19 MED ORDER — SODIUM CHLORIDE 0.9 % IV SOLN
1.0000 g | INTRAVENOUS | Status: DC
  Administered 2020-03-20: 1 g via INTRAVENOUS
  Filled 2020-03-19: qty 10

## 2020-03-19 MED ORDER — SODIUM CHLORIDE 0.9 % IV SOLN
500.0000 mg | Freq: Once | INTRAVENOUS | Status: AC
  Administered 2020-03-19: 500 mg via INTRAVENOUS
  Filled 2020-03-19: qty 500

## 2020-03-19 MED ORDER — ACETAMINOPHEN 500 MG PO TABS
1000.0000 mg | ORAL_TABLET | Freq: Once | ORAL | Status: AC
  Administered 2020-03-19: 1000 mg via ORAL
  Filled 2020-03-19: qty 2

## 2020-03-19 MED ORDER — IPRATROPIUM-ALBUTEROL 0.5-2.5 (3) MG/3ML IN SOLN
3.0000 mL | RESPIRATORY_TRACT | Status: DC
  Administered 2020-03-20 – 2020-03-22 (×14): 3 mL via RESPIRATORY_TRACT
  Filled 2020-03-19 (×15): qty 3

## 2020-03-19 MED ORDER — SODIUM CHLORIDE 0.9 % IV BOLUS
500.0000 mL | Freq: Once | INTRAVENOUS | Status: AC
  Administered 2020-03-20: 500 mL via INTRAVENOUS

## 2020-03-19 MED ORDER — MAGNESIUM SULFATE 2 GM/50ML IV SOLN
2.0000 g | Freq: Once | INTRAVENOUS | Status: AC
  Administered 2020-03-19: 2 g via INTRAVENOUS
  Filled 2020-03-19: qty 50

## 2020-03-19 MED ORDER — SODIUM CHLORIDE 0.9 % IV SOLN
2.0000 g | Freq: Once | INTRAVENOUS | Status: AC
  Administered 2020-03-19: 2 g via INTRAVENOUS
  Filled 2020-03-19: qty 2

## 2020-03-19 MED ORDER — LACTATED RINGERS IV BOLUS (SEPSIS)
1000.0000 mL | Freq: Once | INTRAVENOUS | Status: AC
  Administered 2020-03-19: 1000 mL via INTRAVENOUS

## 2020-03-19 MED ORDER — METHYLPREDNISOLONE SODIUM SUCC 125 MG IJ SOLR
125.0000 mg | Freq: Once | INTRAMUSCULAR | Status: AC
  Administered 2020-03-19: 125 mg via INTRAVENOUS
  Filled 2020-03-19: qty 2

## 2020-03-19 MED ORDER — ENOXAPARIN SODIUM 40 MG/0.4ML ~~LOC~~ SOLN
40.0000 mg | SUBCUTANEOUS | Status: DC
  Administered 2020-03-20 – 2020-03-21 (×2): 40 mg via SUBCUTANEOUS
  Filled 2020-03-19 (×2): qty 0.4

## 2020-03-19 MED ORDER — METHYLPREDNISOLONE SODIUM SUCC 125 MG IJ SOLR
60.0000 mg | Freq: Every day | INTRAMUSCULAR | Status: DC
  Administered 2020-03-20 – 2020-03-21 (×2): 60 mg via INTRAVENOUS
  Filled 2020-03-19 (×2): qty 2

## 2020-03-19 MED ORDER — SODIUM CHLORIDE 0.9% FLUSH: 3.0000 mL | Freq: Once | INTRAVENOUS | Status: DC

## 2020-03-19 NOTE — ED Notes (Addendum)
Pt is able to speak full sentences. Pt is dyspneic at rest, audible inspiratory and expiratory wheezes noted throughout lung fields. Pt denies using home oxygen, reports using both inhaler and home nebulizer treatments at home. No edema of swelling noted. Skin is warm, dry, and pink.

## 2020-03-19 NOTE — Progress Notes (Signed)
PHARMACY -  BRIEF ANTIBIOTIC NOTE   Pharmacy has received consult(s) for Cefepime from an ED provider.  The patient's profile has been reviewed for ht/wt/allergies/indication/available labs.    One time order(s) placed for Cefepime 2g IV x 1 dose.  Further antibiotics/pharmacy consults should be ordered by admitting physician if indicated.                       Thank you, Pearla Dubonnet 03/19/2020  9:37 PM

## 2020-03-19 NOTE — ED Provider Notes (Signed)
[facility] Emergency Department Provider Note    First MD Initiated Contact with Patient 03/19/20 2042     (approximate)  I have reviewed the triage vital signs and the nursing notes.   HISTORY  Chief Complaint Shortness of Breath    HPI [name] is a 74 y.o. female with the below listed past medical history presents to the ER for evaluation of worsening shortness of breath as well as wheezing.   Has reportedly had fever at home today.  Family member was recently diagnosed with walking pneumonia.  She is received her Covid vaccinations.  She denies any swelling or pain.  Does not wear home oxygen.   Past Medical History:  Diagnosis Date  . Allergy   . Asthma   . Back pain   . Bronchitis   . COPD (chronic obstructive pulmonary disease) ([place])   . Degenerative disc disease, lumbar   . Depression   . FHx: cholecystectomy   . GERD (gastroesophageal reflux disease)   . H/O: hysterectomy   . Headache   . Hypertension   . Murmur   . Osteoporosis   . Pneumonia 03/31/16   being treated by PCP  . Restless legs syndrome   . Scoliosis   . Shingles   . Thyroid disease    Family History  Problem Relation Age of Onset  . Kidney disease Father   . Heart disease Mother   . Diabetes Mother    Past Surgical History:  Procedure Laterality Date  . ABDOMINAL HYSTERECTOMY    . APPENDECTOMY    . BACK SURGERY    . CHOLECYSTECTOMY    . COLONOSCOPY WITH PROPOFOL N/A 01/16/2018   Procedure: COLONOSCOPY WITH PROPOFOL;  Surgeon: Toledo, Benay Pike, MD;  Location: [facility] ENDOSCOPY;  Service: Gastroenterology;  Laterality: N/A;  . ESOPHAGOGASTRODUODENOSCOPY (EGD) WITH PROPOFOL N/A 01/16/2018   Procedure: ESOPHAGOGASTRODUODENOSCOPY (EGD) WITH PROPOFOL;  Surgeon: Toledo, Benay Pike, MD;  Location: [facility] ENDOSCOPY;  Service: Gastroenterology;  Laterality: N/A;  . JOINT REPLACEMENT Left 1998   shoulder  . NECK SURGERY Bilateral   . ROTATOR CUFF REPAIR Right   .  SHOULDER OPEN ROTATOR CUFF REPAIR Right   . SHOULDER SURGERY Left    replacement   Patient Active Problem List   Diagnosis Date Noted  . Hyponatremia 04/27/2019  . Hallucinations, visual 04/27/2019  . Iron deficiency anemia 02/01/2018  . Acute exacerbation of chronic obstructive pulmonary disease (COPD) ([place]) 11/05/2016  . Spinal stenosis, lumbar region, with neurogenic claudication 07/08/2015  . Sacroiliac joint dysfunction 06/28/2015  . Greater trochanteric bursitis 04/10/2015  . Sacroiliac joint disease 03/14/2015  . Facet syndrome, lumbar 03/14/2015  . HTN (hypertension) 03/06/2015  . Thyroid disease 03/06/2015  . DDD (degenerative disc disease), cervical 03/04/2015  . DDD (degenerative disc disease), lumbar 03/04/2015  . DJD (degenerative joint disease) of knee 03/04/2015      Prior to Admission medications   Medication Sig Start Date End Date Taking? Authorizing Provider  albuterol (PROVENTIL HFA;VENTOLIN HFA) 108 (90 Base) MCG/ACT inhaler Inhale 1-2 puffs into the lungs every 6 (six) hours as needed for wheezing or shortness of breath.    [provider]  amLODipine (NORVASC) 5 MG tablet Take 5 mg by mouth daily.    [provider]  Cholecalciferol (VITAMIN D) 2000 UNITS tablet Take 2,000 Units by mouth daily.    [provider]  citalopram (CELEXA) 10 MG tablet Take 1 tablet (10 mg total) by mouth daily. 05/06/19  Vaughan Basta, MD  furosemide (LASIX) 20 MG tablet Take 1 tablet (20 mg total) by mouth daily. 05/06/19   Vaughan Basta, MD  gabapentin (NEURONTIN) 100 MG capsule Take 2 capsules (200 mg total) by mouth at bedtime. 05/05/19   Vaughan Basta, MD  levothyroxine (SYNTHROID, LEVOTHROID) 112 MCG tablet Take 112 mcg by mouth daily before breakfast.    [provider]  meloxicam (MOBIC) 15 MG tablet Take 15 mg by mouth daily. 04/22/19   [provider]  metoprolol (LOPRESSOR) 100 MG tablet Take 100 mg by  mouth 2 (two) times daily.    [provider]  Multiple Vitamin (MULTIVITAMIN) tablet Take 1 tablet by mouth daily. Reported on 04/05/2016    [provider]  omeprazole (PRILOSEC) 20 MG capsule Take 20 mg by mouth daily.     [provider]  potassium chloride SA (K-DUR,KLOR-CON) 20 MEQ tablet Take 20 mEq by mouth daily.    [provider]  predniSONE (DELTASONE) 20 MG tablet Take 2 tablets (40 mg total) by mouth daily. 05/05/19   Vaughan Basta, MD  sodium chloride 1 g tablet Take 1 tablet (1 g total) by mouth 3 (three) times daily with meals. 05/05/19   Vaughan Basta, MD  tiotropium (SPIRIVA HANDIHALER) 18 MCG inhalation capsule Place 1 capsule (18 mcg total) into inhaler and inhale daily. 11/14/17   Laverle Hobby, MD  traMADol (ULTRAM) 50 MG tablet Take 1 tablet (50 mg total) by mouth every 6 (six) hours as needed for moderate pain or severe pain. 04/21/19   Delman Kitten, MD    Allergies Adhesive [tape] and Other    Social History Social History   Tobacco Use  . Smoking status: Former Smoker    Packs/day: 1.00    Years: 30.00    Pack years: 30.00    Types: Cigarettes    Quit date: 11/04/2016    Years since quitting: 3.3  . Smokeless tobacco: Never Used  Substance Use Topics  . Alcohol use: No    Alcohol/week: 0.0 standard drinks  . Drug use: No    Review of Systems Patient denies headaches, rhinorrhea, blurry vision, numbness, shortness of breath, chest pain, edema, cough, abdominal pain, nausea, vomiting, diarrhea, dysuria, fevers, rashes or hallucinations unless otherwise stated above in HPI. ____________________________________________   PHYSICAL EXAM:  VITAL SIGNS: Vitals:   03/19/20 2200 03/19/20 2230  BP: 110/73 101/75  Pulse: (!) 112 (!) 106  Resp: (!) 24 18  Temp:    SpO2: 91% 93%    Constitutional: Alert and oriented.  Eyes: Conjunctivae are normal.  Head: Atraumatic. Nose: No  congestion/rhinnorhea. Mouth/Throat: Mucous membranes are moist.   Neck: No stridor. Painless ROM.  Cardiovascular: Normal rate, regular rhythm. Grossly normal heart sounds.  Good peripheral circulation. Respiratory: tachypnea with expiratory wheezing, speaking in complete sentences. Gastrointestinal: Soft and nontender. No distention. No abdominal bruits. No CVA tenderness. Genitourinary:  Musculoskeletal: No lower extremity tenderness nor edema.  No joint effusions. Neurologic:  Normal speech and language. No gross focal neurologic deficits are appreciated. No facial droop Skin:  Skin is warm, dry and intact. No rash noted. Psychiatric: Mood and affect are normal. Speech and behavior are normal.  ____________________________________________   LABS (all labs ordered are listed, but only abnormal results are displayed)  Results for orders placed or performed during the hospital encounter of 03/19/20 (from the past 24 hour(s))  Basic metabolic panel     Status: Abnormal   Collection Time: 03/19/20  8:30 PM  Result Value Ref Range   Sodium 134 (L) 135 - 145 mmol/L   Potassium 4.3 3.5 - 5.1 mmol/L   Chloride 95 (L) 98 - 111 mmol/L   CO2 28 22 - 32 mmol/L   Glucose, Bld 116 (H) 70 - 99 mg/dL   BUN 12 8 - 23 mg/dL   Creatinine, Ser 0.78 0.44 - 1.00 mg/dL   Calcium 9.2 8.9 - 10.3 mg/dL   GFR calc non Af Amer >60 >60 mL/min   GFR calc Af Amer >60 >60 mL/min   Anion gap 11 5 - 15  CBC     Status: Abnormal   Collection Time: 03/19/20  8:30 PM  Result Value Ref Range   WBC 18.0 (H) 4.0 - 10.5 K/uL   RBC 4.62 3.87 - 5.11 MIL/uL   Hemoglobin 12.2 12.0 - 15.0 g/dL   HCT 38.1 36.0 - 46.0 %   MCV 82.5 80.0 - 100.0 fL   MCH 26.4 26.0 - 34.0 pg   MCHC 32.0 30.0 - 36.0 g/dL   RDW 15.1 11.5 - 15.5 %   Platelets 339 150 - 400 K/uL   nRBC 0.0 0.0 - 0.2 %  Troponin I (High Sensitivity)     Status: None   Collection Time: 03/19/20  8:30 PM  Result Value Ref Range   Troponin I (High  Sensitivity) 4 <18 ng/L  Lactic acid, plasma     Status: Abnormal   Collection Time: 03/19/20  9:34 PM  Result Value Ref Range   Lactic Acid, Venous 2.5 (HH) 0.5 - 1.9 mmol/L  Blood gas, venous ([facility], [facility], [facility])     Status: Abnormal   Collection Time: 03/19/20  9:35 PM  Result Value Ref Range   pH, Ven 7.43 7.250 - 7.430   pCO2, Ven 45 44.0 - 60.0 mmHg   pO2, Ven 70.0 (H) 32.0 - 45.0 mmHg   Bicarbonate 29.9 (H) 20.0 - 28.0 mmol/L   Acid-Base Excess 4.8 (H) 0.0 - 2.0 mmol/L   O2 Saturation 94.3 %   Patient temperature 37.0    Collection site VEIN    Sample type VENOUS   APTT     Status: None   Collection Time: 03/19/20  9:43 PM  Result Value Ref Range   aPTT 27 24 - 36 seconds   ____________________________________________  EKG My review and personal interpretation at Time: 20:28   Indication: sepsis  Rate: 115  Rhythm: sinus Axis: left Other: normal intervals, nonspecific st abn likely rate dependent ____________________________________________  RADIOLOGY  I personally reviewed all radiographic images ordered to evaluate for the above acute complaints and reviewed radiology reports and findings.  These findings were personally discussed with the patient.  Please see medical record for radiology report.  ____________________________________________   PROCEDURES  Procedure(s) performed:  .Critical Care Performed by: Merlyn Lot, MD Authorized by: Merlyn Lot, MD   Critical care provider statement:    Critical care time (minutes):  45   Critical care time was exclusive of:  Separately billable procedures and treating other patients   Critical care was necessary to treat or prevent imminent or life-threatening deterioration of the following conditions:  Sepsis and respiratory failure   Critical care was time spent personally by me on the following activities:  Development of treatment plan with patient or surrogate, discussions with consultants, evaluation of  patient's response to treatment, examination of patient, obtaining history from patient or surrogate, ordering and performing treatments and interventions, ordering and review of laboratory studies, ordering  and review of radiographic studies, pulse oximetry, re-evaluation of patient's condition and review of old [place] performed: yes ____________________________________________   INITIAL IMPRESSION / ASSESSMENT AND PLAN / ED COURSE  Pertinent labs & imaging results that were available during my care of the patient were reviewed by me and considered in my medical decision making (see chart for details).   DDX: COPD, asthma, pneumonia, CHF, PE, sepsis, anemia  [name] is a 74 y.o. who presents to the ED with symptoms as described above.  Patient is ill-appearing with acute respiratory failure with hypoxia.  EKG is abnormal tachycardic with nonspecific ST changes likely rate dependent or demand ischemic changes secondary to respiratory failure.  Patient with wheezing sounds like COPD exacerbation and dose of steroids.  The patient will be placed on continuous pulse oximetry and telemetry for monitoring.  Laboratory evaluation will be sent to evaluate for the above complaints.     Clinical Course as of Mar 20 2303  Fri Mar 19, 2020  2136 Patient with some improvement after neb and steroid.  Did feel warm to touch therefore rectal temperature obtained she has febrile tachycardic with her tachypnea will treat for sepsis.  Does have evidence of acute respiratory failure with hypoxia.  Will start antibiotics.   [PR]  2302 Lactate elevated.  Will continue with IV fluid resuscitation will adjust bolus volume for adjusted body weight 245 kg is which is her ideal body weight.  She does feel like she is improving after steroids and nebs.  Will discuss with hospitalist.   [PR]    Clinical Course User Index [PR] Merlyn Lot, MD    The patient was evaluated in Emergency  Department today for the symptoms described in the history of present illness. He/she was evaluated in the context of the global COVID-19 pandemic, which necessitated consideration that the patient might be at risk for infection with the SARS-CoV-2 virus that causes COVID-19. Institutional protocols and algorithms that pertain to the evaluation of patients at risk for COVID-19 are in a state of rapid change based on information released by regulatory bodies including the CDC and federal and state organizations. These policies and algorithms were followed during the patient's care in the ED.  As part of my medical decision making, I reviewed the following data within the [place] notes reviewed and incorporated, Labs reviewed, notes from prior ED visits and [place] Controlled Substance Database   ____________________________________________   FINAL CLINICAL IMPRESSION(S) / ED DIAGNOSES  Final diagnoses:  Sepsis with acute hypoxic respiratory failure, due to unspecified organism, unspecified whether septic shock present ([place])      NEW MEDICATIONS STARTED DURING THIS VISIT:  New Prescriptions   No medications on file     Note:  This document was prepared using Dragon voice recognition software and may include unintentional dictation errors.    Merlyn Lot, MD 03/19/20 [phone]

## 2020-03-19 NOTE — H&P (Signed)
History and Physical    [name]:[id number] DOB: April 06, 1946 DOA: 03/19/2020  PCP: Baxter Hire, MD  Patient coming from: Home  I have personally briefly reviewed patient's old medical records in [place]  Chief Complaint: Worsening shortness of breath  HPI: [name] is a 74 y.o. female with medical history significant for History of COPD, asthma, hypothyroidism, hypertension, type 2 diabetes and morbid obesity who presents with concerns of worsening shortness of breath.  Patient reports she has been feeling short of breath for the past week but this progressively became worse last night.  She is short of breath both at rest and with exertion.  Last night she was choking on her food because she felt like she cannot eat and breathe at the same time.  She has been having increased cough productive of yellow sputum.  Also feels chest tightness that is likely associated with her shortness of breath.  Felt hot today as well.  She has been using her inhalers and PRN nebs daily.  Not on any oxygen at baseline.  Her caretaker has been coughing lately.  Her son was also diagnosed this morning with walking pneumonia.  She has received both doses of her Covid vaccine in April. Has history of previous tobacco use but quit several years ago. She was febrile up to 101, tachycardic up to 120 and intermittent hypotensive down to 90s/50s and was hypoxic down to 89% and placed on 3L.  Leukocytosis of 18. Lactic of 2.5. Troponin of 4 and then 3. PCT of 0.13.  VBG with pH of 7.43, CO2 of 45, O2 of 70, Bicarb of 29.9  CXR shows progressive basilar predominant interstitial changes throughout the lungs consistent with scarring. No airspace disease.   She was given Duonebs x 2. Given cefepime, azithromycin, IV solumedrol, and IV magnesium. Also received 1L of LR fluid.  Review of Systems: Constitutional: No Weight Change, + Fever ENT/Mouth: No sore throat, No Rhinorrhea Eyes: No Eye Pain,  No Vision Changes Cardiovascular: +Chest Pain, + SOB, No PND, + Dyspnea on Exertion, No Orthopnea, No Claudication, chronic left LE Edema, No Palpitations Respiratory: + Cough, + Sputum, + Wheezing,+ Dyspnea  Gastrointestinal: No Nausea, No Vomiting, No Diarrhea, No Constipation, No Pain Genitourinary: no Urinary Incontinence Musculoskeletal: No Arthralgias, No Myalgias Skin: No Skin Lesions, No Pruritus, Neuro: no Weakness, No Numbness Psych: No Anxiety/Panic, No Depression, no decrease appetite Heme/Lymph: No Bruising, No Bleeding  Past Medical History:  Diagnosis Date  . Allergy   . Asthma   . Back pain   . Bronchitis   . COPD (chronic obstructive pulmonary disease) ([place])   . Degenerative disc disease, lumbar   . Depression   . FHx: cholecystectomy   . GERD (gastroesophageal reflux disease)   . H/O: hysterectomy   . Headache   . Hypertension   . Murmur   . Osteoporosis   . Pneumonia 03/31/16   being treated by PCP  . Restless legs syndrome   . Scoliosis   . Shingles   . Thyroid disease     Past Surgical History:  Procedure Laterality Date  . ABDOMINAL HYSTERECTOMY    . APPENDECTOMY    . BACK SURGERY    . CHOLECYSTECTOMY    . COLONOSCOPY WITH PROPOFOL N/A 01/16/2018   Procedure: COLONOSCOPY WITH PROPOFOL;  Surgeon: Toledo, Benay Pike, MD;  Location: [facility] ENDOSCOPY;  Service: Gastroenterology;  Laterality: N/A;  . ESOPHAGOGASTRODUODENOSCOPY (EGD) WITH PROPOFOL N/A 01/16/2018   Procedure: ESOPHAGOGASTRODUODENOSCOPY (  EGD) WITH PROPOFOL;  Surgeon: Toledo, Benay Pike, MD;  Location: [facility] ENDOSCOPY;  Service: Gastroenterology;  Laterality: N/A;  . JOINT REPLACEMENT Left 1998   shoulder  . NECK SURGERY Bilateral   . ROTATOR CUFF REPAIR Right   . SHOULDER OPEN ROTATOR CUFF REPAIR Right   . SHOULDER SURGERY Left    replacement     reports that she quit smoking about 3 years ago. Her smoking use included cigarettes. She has a 30.00 pack-year smoking history. She has never  used smokeless tobacco. She reports that she does not drink alcohol or use drugs.  Allergies  Allergen Reactions  . Adhesive [Tape] Other (See Comments)  . Other Rash    Plastic tape    Family History  Problem Relation Age of Onset  . Kidney disease Father   . Heart disease Mother   . Diabetes Mother      Prior to Admission medications   Medication Sig Start Date End Date Taking? Authorizing Provider  albuterol (PROVENTIL HFA;VENTOLIN HFA) 108 (90 Base) MCG/ACT inhaler Inhale 1-2 puffs into the lungs every 6 (six) hours as needed for wheezing or shortness of breath.    [provider]  amLODipine (NORVASC) 5 MG tablet Take 5 mg by mouth daily.    [provider]  Cholecalciferol (VITAMIN D) 2000 UNITS tablet Take 2,000 Units by mouth daily.    [provider]  citalopram (CELEXA) 10 MG tablet Take 1 tablet (10 mg total) by mouth daily. 05/06/19   Vaughan Basta, MD  furosemide (LASIX) 20 MG tablet Take 1 tablet (20 mg total) by mouth daily. 05/06/19   Vaughan Basta, MD  gabapentin (NEURONTIN) 100 MG capsule Take 2 capsules (200 mg total) by mouth at bedtime. 05/05/19   Vaughan Basta, MD  levothyroxine (SYNTHROID, LEVOTHROID) 112 MCG tablet Take 112 mcg by mouth daily before breakfast.    [provider]  meloxicam (MOBIC) 15 MG tablet Take 15 mg by mouth daily. 04/22/19   [provider]  metoprolol (LOPRESSOR) 100 MG tablet Take 100 mg by mouth 2 (two) times daily.    [provider]  Multiple Vitamin (MULTIVITAMIN) tablet Take 1 tablet by mouth daily. Reported on 04/05/2016    [provider]  omeprazole (PRILOSEC) 20 MG capsule Take 20 mg by mouth daily.     [provider]  potassium chloride SA (K-DUR,KLOR-CON) 20 MEQ tablet Take 20 mEq by mouth daily.    [provider]  predniSONE (DELTASONE) 20 MG tablet Take 2 tablets (40 mg total) by mouth daily. 05/05/19   Vaughan Basta,  MD  sodium chloride 1 g tablet Take 1 tablet (1 g total) by mouth 3 (three) times daily with meals. 05/05/19   Vaughan Basta, MD  tiotropium (SPIRIVA HANDIHALER) 18 MCG inhalation capsule Place 1 capsule (18 mcg total) into inhaler and inhale daily. 11/14/17   Laverle Hobby, MD  traMADol (ULTRAM) 50 MG tablet Take 1 tablet (50 mg total) by mouth every 6 (six) hours as needed for moderate pain or severe pain. 04/21/19   Delman Kitten, MD    Physical Exam: Vitals:   03/19/20 2132 03/19/20 2200 03/19/20 2230 03/19/20 2300  BP:  110/73 101/75 107/86  Pulse:  (!) 112 (!) 106 (!) 104  Resp:  (!) 24 18 (!) 22  Temp: (!) 101 F (38.3 C)     TempSrc: Rectal     SpO2:  91% 93% 94%  Weight:      Height:  Constitutional: NAD, calm, mildly ill-appearing female sitting upright in bed with audible wheezing Vitals:   03/19/20 2132 03/19/20 2200 03/19/20 2230 03/19/20 2300  BP:  110/73 101/75 107/86  Pulse:  (!) 112 (!) 106 (!) 104  Resp:  (!) 24 18 (!) 22  Temp: (!) 101 F (38.3 C)     TempSrc: Rectal     SpO2:  91% 93% 94%  Weight:      Height:       Eyes: PERRL, lids and conjunctivae normal ENMT: Mucous membranes are moist.  Poor dentition.  Neck: normal, supple Respiratory: Decreased aeration with diffuse wheezing throughout.  Audible wheezing with dyspnea on exertion on 2 L O2 via nasal cannula.  No accessory muscle use.  Cardiovascular: Regular rate and rhythm, no murmurs / rubs / gallops. No extremity edema. 2+ pedal pulses. Abdomen: no tenderness, no masses palpated.  Bowel sounds positive.  Musculoskeletal: no clubbing / cyanosis. No joint deformity upper and lower extremities. Good ROM, no contractures. Normal muscle tone.  Skin: no rashes, lesions, ulcers. No induration Neurologic: CN 2-12 grossly intact. Sensation intact. Strength 4/5 in right lower extremity compared to 5 out of 5 strength of all other extremities. Psychiatric: Normal judgment and insight. Alert  and oriented x 3. Normal mood.     Labs on Admission: I have personally reviewed following labs and imaging studies  CBC: Recent Labs  Lab 03/19/20 2030  WBC 18.0*  HGB 12.2  HCT 38.1  MCV 82.5  PLT 409   Basic Metabolic Panel: Recent Labs  Lab 03/19/20 2030  NA 134*  K 4.3  CL 95*  CO2 28  GLUCOSE 116*  BUN 12  CREATININE 0.78  CALCIUM 9.2   GFR: Estimated Creatinine Clearance: 62.4 mL/min (by C-G formula based on SCr of 0.78 mg/dL). Liver Function Tests: No results for input(s): AST, ALT, ALKPHOS, BILITOT, PROT, ALBUMIN in the last 168 hours. No results for input(s): LIPASE, AMYLASE in the last 168 hours. No results for input(s): AMMONIA in the last 168 hours. Coagulation Profile: No results for input(s): INR, PROTIME in the last 168 hours. Cardiac Enzymes: No results for input(s): CKTOTAL, CKMB, CKMBINDEX, TROPONINI in the last 168 hours. BNP (last 3 results) No results for input(s): PROBNP in the last 8760 hours. HbA1C: No results for input(s): HGBA1C in the last 72 hours. CBG: No results for input(s): GLUCAP in the last 168 hours. Lipid Profile: No results for input(s): CHOL, HDL, LDLCALC, TRIG, CHOLHDL, LDLDIRECT in the last 72 hours. Thyroid Function Tests: No results for input(s): TSH, T4TOTAL, FREET4, T3FREE, THYROIDAB in the last 72 hours. Anemia Panel: No results for input(s): VITAMINB12, FOLATE, FERRITIN, TIBC, IRON, RETICCTPCT in the last 72 hours. Urine analysis:    Component Value Date/Time   COLORURINE YELLOW (A) 04/27/2019 0033   APPEARANCEUR CLEAR (A) 04/27/2019 0033   APPEARANCEUR Clear 03/05/2014 1731   LABSPEC 1.014 04/27/2019 0033   LABSPEC 1.009 03/05/2014 1731   PHURINE 7.0 04/27/2019 0033   GLUCOSEU NEGATIVE 04/27/2019 0033   GLUCOSEU Negative 03/05/2014 1731   HGBUR NEGATIVE 04/27/2019 0033   BILIRUBINUR NEGATIVE 04/27/2019 0033   BILIRUBINUR Negative 03/05/2014 1731   KETONESUR 20 (A) 04/27/2019 0033   PROTEINUR NEGATIVE  04/27/2019 0033   NITRITE NEGATIVE 04/27/2019 0033   LEUKOCYTESUR NEGATIVE 04/27/2019 0033   LEUKOCYTESUR Negative 03/05/2014 1731    Radiological Exams on Admission: DG Chest 2 View  Result Date: 03/19/2020 CLINICAL DATA:  Shortness of breath since this afternoon, wheezing EXAM: CHEST -  2 VIEW COMPARISON:  04/27/2019 FINDINGS: Frontal and lateral views of the chest demonstrate a stable cardiac silhouette. There is a large hiatal hernia. Diffuse interstitial prominence is seen throughout the lungs, increased since prior exam. This is most pronounced at the lung bases. No focal airspace disease, effusion, or pneumothorax. Severe right shoulder osteoarthritis has progressed. Left shoulder arthroplasty again seen. IMPRESSION: 1. Progressive basilar predominant interstitial changes throughout the lungs, most compatible with scarring. 2. No acute airspace disease. 3. Large hiatal hernia. Electronically Signed   By: Randa Ngo M.D.   On: 03/19/2020 21:04      Assessment/Plan  Sepsis secondary to pneumonia Continue azithromycin and switch to Rocephin instead of Cefepime  Acute hypoxic respiratory failure secondary to pneumonia and COPD/Asthma exacerbation  Continue IV Solu-medrol Scheduled duonebs x 4hrs  Continue IV antibiotics  Hypertension Continue amlodipine, Lasix and metoprolol  Type 2 DM Pt did not realize she had this diagnosis. Will get HbA1C.  Sensitive SSI   Hypothyroidism Continue levothyroxine  Morbid obesity  BMI greater than 40  DVT prophylaxis:.Lovenox Code Status: Full Family Communication: Plan discussed with patient at bedside  disposition Plan: Home with at least 2 midnight stays  Consults called:  Admission status: inpatient   Status is: Inpatient  Remains inpatient appropriate because:Inpatient level of care appropriate due to severity of illness   Dispo: The patient is from: Home              Anticipated d/c is to: Home              Anticipated  d/c date is: 3 days              Patient currently is not medically stable to d/c.          Orene Desanctis DO [facility]   If 7PM-7AM, please contact night-coverage [email or web address]   03/19/2020, 11:56 PM

## 2020-03-19 NOTE — ED Triage Notes (Signed)
Patient with complaint of shortness of breath that started this afternoon. Patient with audible wheezing.

## 2020-03-19 NOTE — ED Notes (Signed)
Pt c/o continues SOB. Inspiratory and expiratory wheezes decreased from initial assessment- still present.

## 2020-03-20 ENCOUNTER — Encounter: Payer: Self-pay | Admitting: Family Medicine

## 2020-03-20 DIAGNOSIS — R6520 Severe sepsis without septic shock: Secondary | ICD-10-CM

## 2020-03-20 DIAGNOSIS — A419 Sepsis, unspecified organism: Principal | ICD-10-CM

## 2020-03-20 DIAGNOSIS — E119 Type 2 diabetes mellitus without complications: Secondary | ICD-10-CM

## 2020-03-20 DIAGNOSIS — J189 Pneumonia, unspecified organism: Secondary | ICD-10-CM

## 2020-03-20 DIAGNOSIS — J690 Pneumonitis due to inhalation of food and vomit: Secondary | ICD-10-CM

## 2020-03-20 LAB — GLUCOSE, CAPILLARY
Glucose-Capillary: 157 mg/dL — ABNORMAL HIGH (ref 70–99)
Glucose-Capillary: 140 mg/dL — ABNORMAL HIGH (ref 70–99)
Glucose-Capillary: 117 mg/dL — ABNORMAL HIGH (ref 70–99)
Glucose-Capillary: 150 mg/dL — ABNORMAL HIGH (ref 70–99)

## 2020-03-20 LAB — BASIC METABOLIC PANEL
Anion gap: 10 (ref 5–15)
BUN: 13 mg/dL (ref 8–23)
CO2: 26 mmol/L (ref 22–32)
Calcium: 8.5 mg/dL — ABNORMAL LOW (ref 8.9–10.3)
Chloride: 95 mmol/L — ABNORMAL LOW (ref 98–111)
Creatinine, Ser: 0.6 mg/dL (ref 0.44–1.00)
GFR calc Af Amer: >60 mL/min (ref >60)
GFR calc non Af Amer: >60 mL/min (ref >60)
Glucose, Bld: 159 mg/dL — ABNORMAL HIGH (ref 70–99)
Potassium: 4 mmol/L (ref 3.5–5.1)
Sodium: 131 mmol/L — ABNORMAL LOW (ref 135–145)

## 2020-03-20 LAB — BRAIN NATRIURETIC PEPTIDE: B Natriuretic Peptide: 174.4 pg/mL — ABNORMAL HIGH (ref 0.0–100.0)

## 2020-03-20 LAB — URINALYSIS, ROUTINE W REFLEX MICROSCOPIC
Bilirubin Urine: NEGATIVE
Glucose, UA: NEGATIVE mg/dL
Hgb urine dipstick: NEGATIVE
Ketones, ur: NEGATIVE mg/dL
Leukocytes,Ua: NEGATIVE
Nitrite: NEGATIVE
Protein, ur: NEGATIVE mg/dL
Specific Gravity, Urine: 1.015 (ref 1.005–1.030)
pH: 5 (ref 5.0–8.0)

## 2020-03-20 LAB — CBC
HCT: 34.1 % — ABNORMAL LOW (ref 36.0–46.0)
Hemoglobin: 10.6 g/dL — ABNORMAL LOW (ref 12.0–15.0)
MCH: 26.6 pg (ref 26.0–34.0)
MCHC: 31.1 g/dL (ref 30.0–36.0)
MCV: 85.7 fL (ref 80.0–100.0)
Platelets: 260 10*3/uL (ref 150–400)
RBC: 3.98 MIL/uL (ref 3.87–5.11)
RDW: 15.3 % (ref 11.5–15.5)
WBC: 25.4 10*3/uL — ABNORMAL HIGH (ref 4.0–10.5)
nRBC: 0 % (ref 0.0–0.2)

## 2020-03-20 LAB — MAGNESIUM: Magnesium: 1.9 mg/dL (ref 1.7–2.4)

## 2020-03-20 LAB — LACTIC ACID, PLASMA
Lactic Acid, Venous: 4.6 mmol/L (ref 0.5–1.9)
Lactic Acid, Venous: 3.2 mmol/L (ref 0.5–1.9)
Lactic Acid, Venous: 3.9 mmol/L (ref 0.5–1.9)

## 2020-03-20 LAB — HEMOGLOBIN A1C
Hgb A1c MFr Bld: 5.7 % — ABNORMAL HIGH (ref 4.8–5.6)
Mean Plasma Glucose: 116.89 mg/dL

## 2020-03-20 MED ORDER — AMLODIPINE BESYLATE 5 MG PO TABS
5.0000 mg | ORAL_TABLET | Freq: Every day | ORAL | Status: DC
  Administered 2020-03-21 – 2020-03-22 (×2): 5 mg via ORAL
  Filled 2020-03-20 (×3): qty 1

## 2020-03-20 MED ORDER — POTASSIUM CHLORIDE CRYS ER 20 MEQ PO TBCR
20.0000 meq | EXTENDED_RELEASE_TABLET | Freq: Every day | ORAL | Status: DC
  Administered 2020-03-20 – 2020-03-22 (×3): 20 meq via ORAL
  Filled 2020-03-20 (×3): qty 1

## 2020-03-20 MED ORDER — CITALOPRAM HYDROBROMIDE 20 MG PO TABS
10.0000 mg | ORAL_TABLET | Freq: Every day | ORAL | Status: DC
  Administered 2020-03-20 – 2020-03-22 (×3): 10 mg via ORAL
  Filled 2020-03-20 (×3): qty 1

## 2020-03-20 MED ORDER — TAMSULOSIN HCL 0.4 MG PO CAPS
0.4000 mg | ORAL_CAPSULE | Freq: Every day | ORAL | Status: DC
  Administered 2020-03-20 – 2020-03-22 (×3): 0.4 mg via ORAL
  Filled 2020-03-20 (×3): qty 1

## 2020-03-20 MED ORDER — SODIUM CHLORIDE 0.9 % IV SOLN: 500.0000 mg | INTRAVENOUS | Status: DC

## 2020-03-20 MED ORDER — SODIUM CHLORIDE 0.9 % IV SOLN
3.0000 g | Freq: Four times a day (QID) | INTRAVENOUS | Status: DC
  Administered 2020-03-20 – 2020-03-22 (×8): 3 g via INTRAVENOUS
  Filled 2020-03-20: qty 3
  Filled 2020-03-20: qty 8
  Filled 2020-03-20: qty 3
  Filled 2020-03-20 (×2): qty 8
  Filled 2020-03-20: qty 3
  Filled 2020-03-20 (×2): qty 8
  Filled 2020-03-20 (×4): qty 3

## 2020-03-20 MED ORDER — GABAPENTIN 100 MG PO CAPS
200.0000 mg | ORAL_CAPSULE | Freq: Every day | ORAL | Status: DC
  Administered 2020-03-21: 200 mg via ORAL
  Filled 2020-03-20: qty 2

## 2020-03-20 MED ORDER — LEVOTHYROXINE SODIUM 112 MCG PO TABS
112.0000 ug | ORAL_TABLET | Freq: Every day | ORAL | Status: DC
  Administered 2020-03-20 – 2020-03-22 (×3): 112 ug via ORAL
  Filled 2020-03-20 (×4): qty 1

## 2020-03-20 MED ORDER — METOPROLOL TARTRATE 50 MG PO TABS
100.0000 mg | ORAL_TABLET | Freq: Two times a day (BID) | ORAL | Status: DC
  Administered 2020-03-20 – 2020-03-22 (×4): 100 mg via ORAL
  Filled 2020-03-20 (×5): qty 2

## 2020-03-20 MED ORDER — INSULIN ASPART 100 UNIT/ML ~~LOC~~ SOLN
0.0000 [IU] | Freq: Three times a day (TID) | SUBCUTANEOUS | Status: DC
  Administered 2020-03-20: 1 [IU] via SUBCUTANEOUS
  Administered 2020-03-20: 2 [IU] via SUBCUTANEOUS
  Filled 2020-03-20 (×2): qty 1

## 2020-03-20 MED ORDER — CHLORHEXIDINE GLUCONATE CLOTH 2 % EX PADS
6.0000 | MEDICATED_PAD | Freq: Every day | CUTANEOUS | Status: DC
  Administered 2020-03-20 – 2020-03-21 (×2): 6 via TOPICAL

## 2020-03-20 MED ORDER — TRAMADOL HCL 50 MG PO TABS
50.0000 mg | ORAL_TABLET | Freq: Four times a day (QID) | ORAL | Status: DC | PRN
  Administered 2020-03-20 – 2020-03-21 (×2): 50 mg via ORAL
  Filled 2020-03-20 (×2): qty 1

## 2020-03-20 MED ORDER — FUROSEMIDE 20 MG PO TABS
20.0000 mg | ORAL_TABLET | Freq: Every day | ORAL | Status: DC
  Administered 2020-03-20 – 2020-03-22 (×3): 20 mg via ORAL
  Filled 2020-03-20 (×3): qty 1

## 2020-03-20 MED ORDER — PANTOPRAZOLE SODIUM 40 MG PO TBEC
40.0000 mg | DELAYED_RELEASE_TABLET | Freq: Every day | ORAL | Status: DC
  Administered 2020-03-20 – 2020-03-22 (×3): 40 mg via ORAL
  Filled 2020-03-20 (×3): qty 1

## 2020-03-20 MED ORDER — SODIUM CHLORIDE 0.9 % IV BOLUS
1000.0000 mL | Freq: Once | INTRAVENOUS | Status: AC
  Administered 2020-03-20: 1000 mL via INTRAVENOUS

## 2020-03-20 MED ORDER — SODIUM CHLORIDE 0.9 % IV SOLN: INTRAVENOUS | Status: DC

## 2020-03-20 MED ORDER — DOXYCYCLINE HYCLATE 100 MG PO TABS
100.0000 mg | ORAL_TABLET | Freq: Two times a day (BID) | ORAL | Status: DC
  Administered 2020-03-20 – 2020-03-21 (×2): 100 mg via ORAL
  Filled 2020-03-20 (×2): qty 1

## 2020-03-20 NOTE — Progress Notes (Signed)
Cross Cover Brief note RN reports patient with sudden onset tachycardia. - 141 Bedside patient with mild increased work of breathing and mild exp audible wheeze. States she can feel the fluid IV fluids discontinued Scheduled dose of metoprolol given Azithromycin changed to doxycycline

## 2020-03-20 NOTE — ED Notes (Signed)
This RN and Melody NT made unsuccessful attempts at blood specimen collection. Lab called to request phlebotomist.

## 2020-03-20 NOTE — Plan of Care (Signed)

## 2020-03-20 NOTE — ED Notes (Signed)
Pt with R hand IV occluded and infiltrated. IV pulled. Pt then had R hand IV infiltrate, IV pulled. IV team consult placed as pt is difficult stick. Cory Roughen RN updated.

## 2020-03-20 NOTE — Progress Notes (Signed)
Patient's heart rate increased to 141. Spoke to NP, Sharion Settler and scheduled metoprolol was given and maintenance fluids stopped to decrease expiratory wheezing. Will recheck vital signs in an hour.  [name]

## 2020-03-20 NOTE — Progress Notes (Signed)
PROGRESS NOTE    [name]  BMW:[id number] DOB: 1945-12-03 DOA: 03/19/2020 PCP: Baxter Hire, MD   Brief Narrative: [name] is a 74 y.o. female with medical history significant for History of COPD, asthma, hypothyroidism, hypertension, type 2 diabetes and morbid obesity who presents with concerns of worsening shortness of breath.  Patient currently lives in a mobile home, she has significant short of breath walking around at home at baseline.  But she was not on home oxygen.  She normally sleeps in a recliner at nighttime.  Has not experienced any paroxysmal nocturnal dyspnea.  She has a chronic dysphagia, she thought that from her acid reflex.  She also has frequent choking, as a result, he was not being able to eat any bread.  Her shortness of breath is worse for the last 2 weeks.  She also has a cough, nonproductive.  She has intermittent fever.  Upon arrival in the emergency room, patient was found to have significant leukocytosis, tachycardia, lactic acidosis.  She also had a significant hypoxemia, was placed on 5 L oxygen.  She has been giving IV fluid bolus, cefepime for antibiotics.  She was also given steroids.  Assessment & Plan:   Principal Problem:   Acute respiratory failure with hypoxia (HCC) Active Problems:   Thyroid disease   Acute exacerbation of chronic obstructive pulmonary disease (COPD) ([facility])   Community acquired pneumonia   Type 2 diabetes mellitus without complication (HCC)   Obesity, Class III, BMI 40-49.9 (morbid obesity) ([place])  #1.  Severe sepsis. Patient had a significant leukocytosis, tachycardia, lactic acid level of 4.6.  This most likely secondary to aspiration pneumonia.  Reviewed chest x-ray independently, interstitial changes bilaterally, more on the left lower lobe.  Could be due to aspiration.  Given patient history of dysphagia and frequent choking.  Patient lactic acid level peaked at 4.6, she received fluids last night, recheck stat  lactic acid level again.  We will bolus patient if her lactic acid level does not come down.  Due to concern for aspiration pneumonia, antibiotics switched to Unasyn.  #2.  Bilateral lower lobe aspiration pneumonia. Patient has a history of dysphagia and a frequent choking.  Most likely aspiration pneumonia.  Will cover with Unasyn.  Also obtain speech therapy evaluation.  Also schedule for barium esophagram to rule out esophageal stricture due to acid reflux.  Patient may need a EGD depends on the finding on esophagram.  3.  Essential hypertension. Continue some home medicines.  4.  Type 2 diabetes. Continue some sliding scale insulin.  5.  Hypothyroidism. Continue Synthroid.  6.  Hyponatremia. Will follow.  7.  Acute hypoxemic respiratory failure due to aspiration pneumonia. Patient may also has a COPD.  Continue oxygen treatment.  8.  Urinary retention. UA has no evidence of UTI.  Will start Flomax.     DVT prophylaxis: Lovenox Code Status: Full Family Communication:None in room Disposition Plan:  . Patient came from: Home            . Anticipated d/c place: Home . Barriers to d/c OR conditions which need to be met to effect a safe d/c:   Consultants:   None  Procedures: None Antimicrobials: None  Subjective: She is still on 5 L oxygen, short of breath with minimal exertion.  Nonproductive cough. No hemoptysis. No fever or chills. No nausea vomiting or diarrhea. She has some urinary retention, Foley catheter was anchored.  Objective: Vitals:   03/20/20 0930 03/20/20 1000  03/20/20 1029 03/20/20 1030  BP: 115/76 109/69 109/69 121/80  Pulse: 90 85  92  Resp: '19 20  19  ' Temp:      TempSrc:      SpO2: 95% 95%  98%  Weight:      Height:        Intake/Output Summary (Last 24 hours) at 03/20/2020 1126 Last data filed at 03/20/2020 1120 Gross per 24 hour  Intake 2001.82 ml  Output --  Net 2001.82 ml   Filed Weights   03/19/20 2024  Weight: 90.7 kg     Examination:  General exam: Appears calm and comfortable  Respiratory system: Diffuse rhonchi in the base. Respiratory effort normal. Cardiovascular system: Distant heart sounds, RRR. No JVD, murmurs, rubs, gallops or clicks. No pedal edema. Gastrointestinal system: Abdomen is nondistended, soft and nontender. No organomegaly or masses felt. Normal bowel sounds heard. Central nervous system: Alert and oriented. No focal neurological deficits. Extremities: Symmetric 5 x 5 power. Skin: No rashes, lesions or ulcers Psychiatry: Judgement and insight appear normal. Mood & affect appropriate.     Data Reviewed: I have personally reviewed following labs and imaging studies  CBC: Recent Labs  Lab 03/19/20 2030 03/20/20 0723  WBC 18.0* 25.4*  HGB 12.2 10.6*  HCT 38.1 34.1*  MCV 82.5 85.7  PLT 339 256   Basic Metabolic Panel: Recent Labs  Lab 03/19/20 2030 03/20/20 0723  NA 134* 131*  K 4.3 4.0  CL 95* 95*  CO2 28 26  GLUCOSE 116* 159*  BUN 12 13  CREATININE 0.78 0.60  CALCIUM 9.2 8.5*   GFR: Estimated Creatinine Clearance: 62.4 mL/min (by C-G formula based on SCr of 0.6 mg/dL). Liver Function Tests: No results for input(s): AST, ALT, ALKPHOS, BILITOT, PROT, ALBUMIN in the last 168 hours. No results for input(s): LIPASE, AMYLASE in the last 168 hours. No results for input(s): AMMONIA in the last 168 hours. Coagulation Profile: No results for input(s): INR, PROTIME in the last 168 hours. Cardiac Enzymes: No results for input(s): CKTOTAL, CKMB, CKMBINDEX, TROPONINI in the last 168 hours. BNP (last 3 results) No results for input(s): PROBNP in the last 8760 hours. HbA1C: No results for input(s): HGBA1C in the last 72 hours. CBG: Recent Labs  Lab 03/20/20 0838  GLUCAP 157*   Lipid Profile: No results for input(s): CHOL, HDL, LDLCALC, TRIG, CHOLHDL, LDLDIRECT in the last 72 hours. Thyroid Function Tests: No results for input(s): TSH, T4TOTAL, FREET4, T3FREE,  THYROIDAB in the last 72 hours. Anemia Panel: No results for input(s): VITAMINB12, FOLATE, FERRITIN, TIBC, IRON, RETICCTPCT in the last 72 hours. Sepsis Labs: Recent Labs  Lab 03/19/20 2134 03/19/20 2143 03/20/20 0109  PROCALCITON  --  0.13  --   LATICACIDVEN 2.5*  --  4.6*    Recent Results (from the past 240 hour(s))  Blood Culture (routine x 2)     Status: None (Preliminary result)   Collection Time: 03/19/20  9:39 PM   Specimen: BLOOD  Result Value Ref Range Status   Specimen Description BLOOD  Final   Special Requests NONE  Final   Culture   Final    NO GROWTH < 12 HOURS Performed at [facility], [address] [place], [place] [address]    Report Status PENDING  Incomplete  SARS Coronavirus 2 by RT PCR (hospital order, performed in [place] hospital lab) Nasopharyngeal Nasopharyngeal Swab     Status: None   Collection Time: 03/19/20  9:43 PM   Specimen:  Nasopharyngeal Swab  Result Value Ref Range Status   SARS Coronavirus 2 NEGATIVE NEGATIVE Final    Comment: (NOTE) SARS-CoV-2 target nucleic acids are NOT DETECTED. The SARS-CoV-2 RNA is generally detectable in upper and lower respiratory specimens during the acute phase of infection. The lowest concentration of SARS-CoV-2 viral copies this assay can detect is 250 copies / mL. A negative result does not preclude SARS-CoV-2 infection and should not be used as the sole basis for treatment or other patient management decisions.  A negative result may occur with improper specimen collection / handling, submission of specimen other than nasopharyngeal swab, presence of viral mutation(s) within the areas targeted by this assay, and inadequate number of viral copies (<250 copies / mL). A negative result must be combined with clinical observations, patient history, and epidemiological information. Fact Sheet for Patients:   StrictlyIdeas.no Fact Sheet for Healthcare  Providers: BankingDealers.co.za This test is not yet approved or cleared  by the [place] FDA and has been authorized for detection and/or diagnosis of SARS-CoV-2 by FDA under an Emergency Use Authorization (EUA).  This EUA will remain in effect (meaning this test can be used) for the duration of the COVID-19 declaration under Section 564(b)(1) of the Act, 21 U.S.C. section 360bbb-3(b)(1), unless the authorization is terminated or revoked sooner. Performed at [facility], [address] [place], [place] [address]   Blood Culture (routine x 2)     Status: None (Preliminary result)   Collection Time: 03/19/20  9:45 PM   Specimen: BLOOD  Result Value Ref Range Status   Specimen Description BLOOD BLOOD RIGHT HAND  Final   Special Requests   Final    BOTTLES DRAWN AEROBIC AND ANAEROBIC Blood Culture adequate volume   Culture   Final    NO GROWTH < 12 HOURS Performed at [facility], [address] [place], [place] [address]    Report Status PENDING  Incomplete         Radiology Studies: DG Chest 2 View  Result Date: 03/19/2020 CLINICAL DATA:  Shortness of breath since this afternoon, wheezing EXAM: CHEST - 2 VIEW COMPARISON:  04/27/2019 FINDINGS: Frontal and lateral views of the chest demonstrate a stable cardiac silhouette. There is a large hiatal hernia. Diffuse interstitial prominence is seen throughout the lungs, increased since prior exam. This is most pronounced at the lung bases. No focal airspace disease, effusion, or pneumothorax. Severe right shoulder osteoarthritis has progressed. Left shoulder arthroplasty again seen. IMPRESSION: 1. Progressive basilar predominant interstitial changes throughout the lungs, most compatible with scarring. 2. No acute airspace disease. 3. Large hiatal hernia. Electronically Signed   By: Randa Ngo M.D.   On: 03/19/2020 21:04        Scheduled Meds: . amLODipine  5 mg Oral Daily  .  citalopram  10 mg Oral Daily  . enoxaparin (LOVENOX) injection  40 mg Subcutaneous Q24H  . furosemide  20 mg Oral Daily  . [START ON 03/21/2020] gabapentin  200 mg Oral QHS  . insulin aspart  0-9 Units Subcutaneous TID WC  . ipratropium-albuterol  3 mL Nebulization Q4H  . levothyroxine  112 mcg Oral QAC breakfast  . methylPREDNISolone (SOLU-MEDROL) injection  60 mg Intravenous Daily  . metoprolol tartrate  100 mg Oral BID  . pantoprazole  40 mg Oral Daily  . potassium chloride SA  20 mEq Oral Daily   Continuous Infusions: . ampicillin-sulbactam (UNASYN) IV    . [START ON 03/21/2020] azithromycin  LOS: 1 day    Time spent: 45 minutes    Sharen Hones, MD [facility]   To contact the attending provider between 7A-7P or the covering provider during after hours 7P-7A, please log into the web site [email or web address] and access using universal [place] password for that web site. If you do not have the password, please call the hospital operator.  03/20/2020, 11:26 AM

## 2020-03-20 NOTE — Progress Notes (Signed)
Rechecked vitals and patient's heart rate decreased to 94. Will continue to monitor vitals in 2 hours.  [name]

## 2020-03-21 LAB — CBC WITH DIFFERENTIAL/PLATELET
Abs Immature Granulocytes: 0.16 10*3/uL — ABNORMAL HIGH (ref 0.00–0.07)
Basophils Absolute: 0 10*3/uL (ref 0.0–0.1)
Basophils Relative: 0 %
Eosinophils Absolute: 0 10*3/uL (ref 0.0–0.5)
Eosinophils Relative: 0 %
HCT: 34.3 % — ABNORMAL LOW (ref 36.0–46.0)
Hemoglobin: 10.7 g/dL — ABNORMAL LOW (ref 12.0–15.0)
Immature Granulocytes: 1 %
Lymphocytes Relative: 4 %
Lymphs Abs: 1.1 10*3/uL (ref 0.7–4.0)
MCH: 26.6 pg (ref 26.0–34.0)
MCHC: 31.2 g/dL (ref 30.0–36.0)
MCV: 85.1 fL (ref 80.0–100.0)
Monocytes Absolute: 0.7 10*3/uL (ref 0.1–1.0)
Monocytes Relative: 3 %
Neutro Abs: 24 10*3/uL — ABNORMAL HIGH (ref 1.7–7.7)
Neutrophils Relative %: 92 %
Platelets: 271 10*3/uL (ref 150–400)
RBC: 4.03 MIL/uL (ref 3.87–5.11)
RDW: 15.5 % (ref 11.5–15.5)
Smear Review: NORMAL
WBC: 26 10*3/uL — ABNORMAL HIGH (ref 4.0–10.5)
nRBC: 0 % (ref 0.0–0.2)

## 2020-03-21 LAB — BASIC METABOLIC PANEL
Anion gap: 7 (ref 5–15)
BUN: 12 mg/dL (ref 8–23)
CO2: 31 mmol/L (ref 22–32)
Calcium: 9 mg/dL (ref 8.9–10.3)
Chloride: 96 mmol/L — ABNORMAL LOW (ref 98–111)
Creatinine, Ser: 0.54 mg/dL (ref 0.44–1.00)
GFR calc Af Amer: >60 mL/min (ref >60)
GFR calc non Af Amer: >60 mL/min (ref >60)
Glucose, Bld: 97 mg/dL (ref 70–99)
Potassium: 4.1 mmol/L (ref 3.5–5.1)
Sodium: 134 mmol/L — ABNORMAL LOW (ref 135–145)

## 2020-03-21 LAB — GLUCOSE, CAPILLARY
Glucose-Capillary: 87 mg/dL (ref 70–99)
Glucose-Capillary: 111 mg/dL — ABNORMAL HIGH (ref 70–99)
Glucose-Capillary: 145 mg/dL — ABNORMAL HIGH (ref 70–99)
Glucose-Capillary: 132 mg/dL — ABNORMAL HIGH (ref 70–99)

## 2020-03-21 LAB — URINE CULTURE: Culture: NO GROWTH

## 2020-03-21 LAB — MAGNESIUM: Magnesium: 2.1 mg/dL (ref 1.7–2.4)

## 2020-03-21 LAB — LACTIC ACID, PLASMA: Lactic Acid, Venous: 1.4 mmol/L (ref 0.5–1.9)

## 2020-03-21 MED ORDER — DOCUSATE SODIUM 100 MG PO CAPS
100.0000 mg | ORAL_CAPSULE | Freq: Two times a day (BID) | ORAL | Status: DC
  Administered 2020-03-22 (×2): 100 mg via ORAL
  Filled 2020-03-21 (×2): qty 1

## 2020-03-21 MED ORDER — PREDNISONE 20 MG PO TABS
20.0000 mg | ORAL_TABLET | Freq: Every day | ORAL | Status: DC
  Administered 2020-03-22: 20 mg via ORAL
  Filled 2020-03-21: qty 1

## 2020-03-21 MED ORDER — DIPHENHYDRAMINE HCL 25 MG PO CAPS
50.0000 mg | ORAL_CAPSULE | Freq: Every evening | ORAL | Status: DC | PRN
  Administered 2020-03-22: 50 mg via ORAL
  Filled 2020-03-21: qty 2

## 2020-03-21 NOTE — TOC Initial Note (Signed)
Transition of Care [facility]) - Initial/Assessment Note    Patient Details  Name: [name] MRN: [id number] Date of Birth: 06/30/1946  Transition of Care [facility]) CM/SW Contact:    Meriel Flavors, LCSW Phone Number: 03/21/2020, 4:50 PM  Clinical Narrative:                 Patient admitted for acute respiratory failure from home. Will need DME/HH PT before discharge. Currently not medically stable.        Patient Goals and CMS Choice        Expected Discharge Plan and Services                                                Prior Living Arrangements/Services                       Activities of Daily Living Home Assistive Devices/Equipment: Environmental consultant (specify type), Shower chair without back ADL Screening (condition at time of admission) Patient's cognitive ability adequate to safely complete daily activities?: Yes Is the patient deaf or have difficulty hearing?: No Does the patient have difficulty seeing, even when wearing glasses/contacts?: No Does the patient have difficulty concentrating, remembering, or making decisions?: No Patient able to express need for assistance with ADLs?: Yes Does the patient have difficulty dressing or bathing?: Yes Independently performs ADLs?: Yes (appropriate for developmental age) Does the patient have difficulty walking or climbing stairs?: Yes Weakness of Legs: Both Weakness of Arms/Hands: None  Permission Sought/Granted                  Emotional Assessment              Admission diagnosis:  Esophageal stenosis [K22.2] Acute respiratory failure with hypoxia ([place]) [J96.01] Sepsis with acute hypoxic respiratory failure, due to unspecified organism, unspecified whether septic shock present ([place]) [A41.9, R65.20, J96.01] Patient Active Problem List   Diagnosis Date Noted  . Community acquired pneumonia 03/20/2020  . Type 2 diabetes mellitus without complication ([place]) 67/20/9470  . Obesity, Class III, BMI  40-49.9 (morbid obesity) ([place]) 03/20/2020  . Acute respiratory failure with hypoxia ([place]) 03/19/2020  . Hyponatremia 04/27/2019  . Hallucinations, visual 04/27/2019  . Iron deficiency anemia 02/01/2018  . Acute exacerbation of chronic obstructive pulmonary disease (COPD) ([place]) 11/05/2016  . Spinal stenosis, lumbar region, with neurogenic claudication 07/08/2015  . Sacroiliac joint dysfunction 06/28/2015  . Greater trochanteric bursitis 04/10/2015  . Sacroiliac joint disease 03/14/2015  . Facet syndrome, lumbar 03/14/2015  . HTN (hypertension) 03/06/2015  . Thyroid disease 03/06/2015  . DDD (degenerative disc disease), cervical 03/04/2015  . DDD (degenerative disc disease), lumbar 03/04/2015  . DJD (degenerative joint disease) of knee 03/04/2015   PCP:  Baxter Hire, MD Pharmacy:   [facility] [address] ([address]), [place] [address] [place]) [place] [address] Phone: [phone] Fax: [phone]     Social Determinants of Health (SDOH) Interventions    Readmission Risk Interventions No flowsheet data found.

## 2020-03-21 NOTE — Progress Notes (Signed)
PROGRESS NOTE    [name]  ZOX:[id number] DOB: 06/02/1946 DOA: 03/19/2020 PCP: Baxter Hire, MD   Chief complaint. Shortness of breath  Brief Narrative:   Breanna Pfeifer Riceis a 74 y.o.femalewith medical history significant forHistory of COPD, asthma, hypothyroidism, hypertension, type 2 diabetes and morbid obesity who presents with concerns of worsening shortness of breath.  Patient currently lives in a mobile home, she has significant short of breath walking around at home at baseline.  But she was not on home oxygen.  She normally sleeps in a recliner at nighttime.  Has not experienced any paroxysmal nocturnal dyspnea.  She has a chronic dysphagia, she thought that from her acid reflex.  She also has frequent choking, as a result, he was not being able to eat any bread.  Her shortness of breath is worse for the last 2 weeks.  She also has a cough, nonproductive.  She has intermittent fever.  Upon arrival in the emergency room, patient was found to have significant leukocytosis, tachycardia, lactic acidosis.  She also had a significant hypoxemia, was placed on 5 L oxygen.  She has been giving IV fluid bolus, cefepime for antibiotics.  She was also given steroids.   Assessment & Plan:   Principal Problem:   Acute respiratory failure with hypoxia (HCC) Active Problems:   Thyroid disease   Acute exacerbation of chronic obstructive pulmonary disease (COPD) ([place])   Community acquired pneumonia   Type 2 diabetes mellitus without complication (HCC)   Obesity, Class III, BMI 40-49.9 (morbid obesity) ([place])  #1. Severe sepsis. Secondary to aspiration pneumonia. Condition had improved. Currently lactic acid level normalized. Hemodynamically stable. I'll discontinue IV fluids. Increased leukocytosis mainly from steroids.  #2. Acute hypoxemic respiratory failure. Secondary to aspiration pneumonia. Still on 3 L oxygen, but overall feels better. Continue oxygen treatment.  3. Bilateral  lower lobe aspiration pneumonia. Continue Unasyn, also on doxycycline. Will discontinue doxycycline. Esophagram tomorrow.  4. COPD exacerbation. Discontinue IV steroids, change to oral prednisone.  5. Essential hypertension. Continue home medicines.  6. Type 2 diabetes. Continue sliding scale insulin.  7. Hyponatremia. Improved.  8. Urinary retention. Continue Flomax.    DVT prophylaxis: Lovenox Code Status: Full Family Communication:None in room Disposition Plan:   Patient came from: Home                                                                                                                           Anticipated d/c place: Home  Barriers to d/c OR conditions which need to be met to effect a safe d/c:   Consultants:   None  Procedures: None Antimicrobials:  Unasyn.       Subjective: She is still on 3 L oxygen, but generally feels better. Short of breath with exertion, cough, nonproductive. No fever or chills. No abdominal pain or nausea vomiting or diarrhea.  Objective: Vitals:   03/21/20 8119 03/21/20 0744 03/21/20 0842 03/21/20 0917  BP: 120/85  Marland Kitchen)  116/100 124/71  Pulse: 80  93 90  Resp: 16  18   Temp: 97.6 F (36.4 C)  98.9 F (37.2 C)   TempSrc: Oral  Oral   SpO2: 97% 94% 93%   Weight:      Height:        Intake/Output Summary (Last 24 hours) at 03/21/2020 1005 Last data filed at 03/21/2020 0519 Gross per 24 hour  Intake 400 ml  Output 1425 ml  Net -1025 ml   Filed Weights   03/19/20 2024  Weight: 90.7 kg    Examination:  General exam: Appears calm and comfortable  Respiratory system: Decreased breathing sounds with rhonchi in the base. Respiratory effort normal. Cardiovascular system: S1 & S2 heard, RRR. Distant heart sound. No JVD, murmurs, rubs, gallops or clicks. No pedal edema. Gastrointestinal system: Abdomen is nondistended, soft and nontender. No organomegaly or masses felt. Normal bowel sounds heard. Central  nervous system: Alert and oriented. No focal neurological deficits. Extremities: Symmetric  Skin: No rashes, lesions or ulcers Psychiatry: Judgement and insight appear normal. Mood & affect appropriate.     Data Reviewed: I have personally reviewed following labs and imaging studies  CBC: Recent Labs  Lab 03/19/20 2030 03/20/20 0723 03/21/20 0909  WBC 18.0* 25.4* 26.0*  NEUTROABS  --   --  24.0*  HGB 12.2 10.6* 10.7*  HCT 38.1 34.1* 34.3*  MCV 82.5 85.7 85.1  PLT 339 260 759   Basic Metabolic Panel: Recent Labs  Lab 03/19/20 2030 03/20/20 0723 03/20/20 2146 03/21/20 0909  NA 134* 131*  --  134*  K 4.3 4.0  --  4.1  CL 95* 95*  --  96*  CO2 28 26  --  31  GLUCOSE 116* 159*  --  97  BUN 12 13  --  12  CREATININE 0.78 0.60  --  0.54  CALCIUM 9.2 8.5*  --  9.0  MG  --   --  1.9 2.1   GFR: Estimated Creatinine Clearance: 62.4 mL/min (by C-G formula based on SCr of 0.54 mg/dL). Liver Function Tests: No results for input(s): AST, ALT, ALKPHOS, BILITOT, PROT, ALBUMIN in the last 168 hours. No results for input(s): LIPASE, AMYLASE in the last 168 hours. No results for input(s): AMMONIA in the last 168 hours. Coagulation Profile: No results for input(s): INR, PROTIME in the last 168 hours. Cardiac Enzymes: No results for input(s): CKTOTAL, CKMB, CKMBINDEX, TROPONINI in the last 168 hours. BNP (last 3 results) No results for input(s): PROBNP in the last 8760 hours. HbA1C: Recent Labs    03/20/20 0109  HGBA1C 5.7*   CBG: Recent Labs  Lab 03/20/20 0838 03/20/20 1453 03/20/20 1737 03/20/20 2055 03/21/20 0813  GLUCAP 157* 140* 117* 150* 87   Lipid Profile: No results for input(s): CHOL, HDL, LDLCALC, TRIG, CHOLHDL, LDLDIRECT in the last 72 hours. Thyroid Function Tests: No results for input(s): TSH, T4TOTAL, FREET4, T3FREE, THYROIDAB in the last 72 hours. Anemia Panel: No results for input(s): VITAMINB12, FOLATE, FERRITIN, TIBC, IRON, RETICCTPCT in the last 72  hours. Sepsis Labs: Recent Labs  Lab 03/19/20 2134 03/19/20 2143 03/20/20 0109 03/20/20 1227 03/20/20 1538 03/21/20 0909  PROCALCITON  --  0.13  --   --   --   --   LATICACIDVEN   < >  --  4.6* 3.2* 3.9* 1.4   < > = values in this interval not displayed.    Recent Results (from the past 240 hour(s))  Blood Culture (routine  x 2)     Status: None (Preliminary result)   Collection Time: 03/19/20  9:39 PM   Specimen: BLOOD  Result Value Ref Range Status   Specimen Description BLOOD  Final   Special Requests NONE  Final   Culture   Final    NO GROWTH 2 DAYS Performed at [facility], [address] [place], [place] [address]    Report Status PENDING  Incomplete  SARS Coronavirus 2 by RT PCR (hospital order, performed in [facility] hospital lab) Nasopharyngeal Nasopharyngeal Swab     Status: None   Collection Time: 03/19/20  9:43 PM   Specimen: Nasopharyngeal Swab  Result Value Ref Range Status   SARS Coronavirus 2 NEGATIVE NEGATIVE Final    Comment: (NOTE) SARS-CoV-2 target nucleic acids are NOT DETECTED. The SARS-CoV-2 RNA is generally detectable in upper and lower respiratory specimens during the acute phase of infection. The lowest concentration of SARS-CoV-2 viral copies this assay can detect is 250 copies / mL. A negative result does not preclude SARS-CoV-2 infection and should not be used as the sole basis for treatment or other patient management decisions.  A negative result may occur with improper specimen collection / handling, submission of specimen other than nasopharyngeal swab, presence of viral mutation(s) within the areas targeted by this assay, and inadequate number of viral copies (<250 copies / mL). A negative result must be combined with clinical observations, patient history, and epidemiological information. Fact Sheet for Patients:   StrictlyIdeas.no Fact Sheet for Healthcare Providers:  BankingDealers.co.za This test is not yet approved or cleared  by the [place] FDA and has been authorized for detection and/or diagnosis of SARS-CoV-2 by FDA under an Emergency Use Authorization (EUA).  This EUA will remain in effect (meaning this test can be used) for the duration of the COVID-19 declaration under Section 564(b)(1) of the Act, 21 U.S.C. section 360bbb-3(b)(1), unless the authorization is terminated or revoked sooner. Performed at [facility], [address] [place], [place] [address]   Blood Culture (routine x 2)     Status: None (Preliminary result)   Collection Time: 03/19/20  9:45 PM   Specimen: BLOOD  Result Value Ref Range Status   Specimen Description BLOOD BLOOD RIGHT HAND  Final   Special Requests   Final    BOTTLES DRAWN AEROBIC AND ANAEROBIC Blood Culture adequate volume   Culture   Final    NO GROWTH 2 DAYS Performed at [facility], [address] [place], [place] [address]    Report Status PENDING  Incomplete         Radiology Studies: DG Chest 2 View  Result Date: 03/19/2020 CLINICAL DATA:  Shortness of breath since this afternoon, wheezing EXAM: CHEST - 2 VIEW COMPARISON:  04/27/2019 FINDINGS: Frontal and lateral views of the chest demonstrate a stable cardiac silhouette. There is a large hiatal hernia. Diffuse interstitial prominence is seen throughout the lungs, increased since prior exam. This is most pronounced at the lung bases. No focal airspace disease, effusion, or pneumothorax. Severe right shoulder osteoarthritis has progressed. Left shoulder arthroplasty again seen. IMPRESSION: 1. Progressive basilar predominant interstitial changes throughout the lungs, most compatible with scarring. 2. No acute airspace disease. 3. Large hiatal hernia. Electronically Signed   By: Randa Ngo M.D.   On: 03/19/2020 21:04        Scheduled Meds: . amLODipine  5 mg Oral Daily  . Chlorhexidine  Gluconate Cloth  6 each Topical Daily  .  citalopram  10 mg Oral Daily  . doxycycline  100 mg Oral Q12H  . enoxaparin (LOVENOX) injection  40 mg Subcutaneous Q24H  . furosemide  20 mg Oral Daily  . gabapentin  200 mg Oral QHS  . insulin aspart  0-9 Units Subcutaneous TID WC  . ipratropium-albuterol  3 mL Nebulization Q4H  . levothyroxine  112 mcg Oral QAC breakfast  . metoprolol tartrate  100 mg Oral BID  . pantoprazole  40 mg Oral Daily  . potassium chloride SA  20 mEq Oral Daily  . [START ON 03/22/2020] predniSONE  20 mg Oral Q breakfast  . tamsulosin  0.4 mg Oral Daily   Continuous Infusions: . ampicillin-sulbactam (UNASYN) IV 3 g (03/21/20 0930)     LOS: 2 days    Time spent: 28 minutes    Sharen Hones, MD [facility]   To contact the attending provider between 7A-7P or the covering provider during after hours 7P-7A, please log into the web site [email or web address] and access using universal [place] password for that web site. If you do not have the password, please call the hospital operator.  03/21/2020, 10:05 AM

## 2020-03-21 NOTE — Evaluation (Signed)
Physical Therapy Evaluation Patient Details Name: [name] MRN: [id number] DOB: 06-22-1946 Today's Date: 03/21/2020   History of Present Illness  Patient is a 74 y/o F that presents with shortness of breath. She has a history of GERD, DDD, HTN, and COPD.  Clinical Impression  Patient is a 74 y/o F that presents with shortness of breath and generalized weakness. She has a history of multiple falls over the past year. She has largely used a RW, is now on 2L of SpO2 and reports shortness of breath throughout short distance ambulation. She was able to ambulate short distances safely with RW this date, though required supplemental O2 and required min-mod A for bed mobility secondary to trunkal weakness. She would likely benefit from HHPT upon discharge to improve her functional mobility.     Follow Up Recommendations Home health PT    Equipment Recommendations  Rolling walker with 5" wheels    Recommendations for Other Services       Precautions / Restrictions Precautions Precautions: Fall Restrictions Weight Bearing Restrictions: No      Mobility  Bed Mobility Overal bed mobility: Needs Assistance Bed Mobility: Supine to Sit     Supine to sit: Min assist;Mod assist     General bed mobility comments: Patient requires assistance to complete transfer for management of torso.  Transfers Overall transfer level: Needs assistance Equipment used: Rolling walker (2 wheeled) Transfers: Sit to/from Stand Sit to Stand: Min guard         General transfer comment: Patient is able to transfer sit to stand with use of RW with cga x 1.  Ambulation/Gait Ambulation/Gait assistance: Min guard Gait Distance (Feet): 60 Feet(2 bouts of 30 feet) Assistive device: Rolling walker (2 wheeled) Gait Pattern/deviations: WFL(Within Functional Limits)   Gait velocity interpretation: <1.31 ft/sec, indicative of household ambulator General Gait Details: Patient is able to correct posture with  RW with verbal cuing, is able to ambulate 2 laps in room with 1 rest break in sitting.  Stairs            Wheelchair Mobility    Modified Rankin (Stroke Patients Only)       Balance Overall balance assessment: Needs assistance Sitting-balance support: Bilateral upper extremity supported;Feet supported Sitting balance-Leahy Scale: Good     Standing balance support: Bilateral upper extremity supported Standing balance-Leahy Scale: Good                               Pertinent Vitals/Pain Pain Assessment: (Patient reports she has low back pain during transfers this date.)    Home Living Family/patient expects to be discharged to:: Private residence Living Arrangements: Spouse/significant other;Other (Comment) Available Help at Discharge: Family Type of Home: House Home Access: Ramped entrance     Home Layout: One level Home Equipment: Lilly - 4 wheels;Toilet riser;Tub bench;Grab bars - toilet Additional Comments: Patient has had multiple falls in the past 12 months.    Prior Function Level of Independence: Needs assistance   Gait / Transfers Assistance Needed: mod I with ambulation in home with walker  ADL's / Homemaking Assistance Needed: assistance from grandaughter for supervision/minA for bathing, minA for dressing, family performs homemaking activities        Hand Dominance        Extremity/Trunk Assessment   Upper Extremity Assessment Upper Extremity Assessment: Generalized weakness    Lower Extremity Assessment Lower Extremity Assessment: Generalized weakness  Communication   Communication: No difficulties  Cognition Arousal/Alertness: Awake/alert Behavior During Therapy: WFL for tasks assessed/performed Overall Cognitive Status: Within Functional Limits for tasks assessed                                        General Comments      Exercises     Assessment/Plan    PT Assessment Patient needs  continued PT services  PT Problem List Decreased strength;Decreased mobility;Decreased activity tolerance;Decreased balance;Decreased knowledge of use of DME;Cardiopulmonary status limiting activity       PT Treatment Interventions DME instruction;Therapeutic exercise;Gait training;Balance training;Stair training;Neuromuscular re-education;Functional mobility training;Patient/family education;Therapeutic activities    PT Goals (Current goals can be found in the Care Plan section)  Acute Rehab PT Goals Patient Stated Goal: To return to home mobility. PT Goal Formulation: With patient Time For Goal Achievement: 04/04/20 Potential to Achieve Goals: Good    Frequency Min 2X/week   Barriers to discharge        Co-evaluation               AM-PAC PT "6 Clicks" Mobility  Outcome Measure Help needed turning from your back to your side while in a flat bed without using bedrails?: A Little Help needed moving from lying on your back to sitting on the side of a flat bed without using bedrails?: A Little Help needed moving to and from a bed to a chair (including a wheelchair)?: A Little Help needed standing up from a chair using your arms (e.g., wheelchair or bedside chair)?: A Little Help needed to walk in hospital room?: A Little Help needed climbing 3-5 steps with a railing? : A Lot 6 Click Score: 17    End of Session Equipment Utilized During Treatment: Gait belt Activity Tolerance: Patient tolerated treatment well;Patient limited by fatigue Patient left: with chair alarm set;in chair Nurse Communication: Mobility status PT Visit Diagnosis: Difficulty in walking, not elsewhere classified (R26.2)    Time: 0355-9741 PT Time Calculation (min) (ACUTE ONLY): 15 min   Charges:   PT Evaluation $PT Eval Moderate Complexity: 1 Mod      Royce Macadamia PT, DPT, CSCS      03/21/2020, 4:45 PM

## 2020-03-22 ENCOUNTER — Inpatient Hospital Stay: Payer: Medicare Other

## 2020-03-22 LAB — CBC WITH DIFFERENTIAL/PLATELET
Abs Immature Granulocytes: 0.06 10*3/uL (ref 0.00–0.07)
Basophils Absolute: 0 10*3/uL (ref 0.0–0.1)
Basophils Relative: 0 %
Eosinophils Absolute: 0 10*3/uL (ref 0.0–0.5)
Eosinophils Relative: 0 %
HCT: 32.4 % — ABNORMAL LOW (ref 36.0–46.0)
Hemoglobin: 10.6 g/dL — ABNORMAL LOW (ref 12.0–15.0)
Immature Granulocytes: 0 %
Lymphocytes Relative: 5 %
Lymphs Abs: 0.9 10*3/uL (ref 0.7–4.0)
MCH: 26.8 pg (ref 26.0–34.0)
MCHC: 32.7 g/dL (ref 30.0–36.0)
MCV: 81.8 fL (ref 80.0–100.0)
Monocytes Absolute: 0.8 10*3/uL (ref 0.1–1.0)
Monocytes Relative: 4 %
Neutro Abs: 16.7 10*3/uL — ABNORMAL HIGH (ref 1.7–7.7)
Neutrophils Relative %: 91 %
Platelets: 259 10*3/uL (ref 150–400)
RBC: 3.96 MIL/uL (ref 3.87–5.11)
RDW: 15.2 % (ref 11.5–15.5)
WBC: 18.5 10*3/uL — ABNORMAL HIGH (ref 4.0–10.5)
nRBC: 0 % (ref 0.0–0.2)

## 2020-03-22 LAB — GLUCOSE, CAPILLARY: Glucose-Capillary: 89 mg/dL (ref 70–99)

## 2020-03-22 MED ORDER — IPRATROPIUM-ALBUTEROL 0.5-2.5 (3) MG/3ML IN SOLN
3.0000 mL | Freq: Four times a day (QID) | RESPIRATORY_TRACT | Status: DC
  Filled 2020-03-22: qty 3

## 2020-03-22 MED ORDER — AMOXICILLIN-POT CLAVULANATE 875-125 MG PO TABS: 1.0000 | ORAL_TABLET | Freq: Two times a day (BID) | ORAL | Status: DC

## 2020-03-22 MED ORDER — FLUTICASONE-SALMETEROL 250-50 MCG/DOSE IN AEPB: 1.0000 | INHALATION_SPRAY | Freq: Two times a day (BID) | RESPIRATORY_TRACT | 0 refills | Status: DC

## 2020-03-22 MED ORDER — PREDNISONE 20 MG PO TABS: ORAL_TABLET | ORAL | 0 refills | Status: AC

## 2020-03-22 MED ORDER — AMOXICILLIN-POT CLAVULANATE 875-125 MG PO TABS: 1.0000 | ORAL_TABLET | Freq: Two times a day (BID) | ORAL | 0 refills | Status: AC

## 2020-03-22 NOTE — Evaluation (Signed)
Clinical/Bedside Swallow Evaluation Patient Details  Name: [name] MRN: [id number] Date of Birth: [birth year]  Today's Date: 03/22/2020 Time: SLP Start Time (ACUTE ONLY): 0258 SLP Stop Time (ACUTE ONLY): 0955 SLP Time Calculation (min) (ACUTE ONLY): 22 min  Past Medical History:  Past Medical History:  Diagnosis Date  . Allergy   . Asthma   . Back pain   . Bronchitis   . COPD (chronic obstructive pulmonary disease) ([place])   . Degenerative disc disease, lumbar   . Depression   . FHx: cholecystectomy   . GERD (gastroesophageal reflux disease)   . H/O: hysterectomy   . Headache   . Hypertension   . Murmur   . Osteoporosis   . Pneumonia 03/31/16   being treated by PCP  . Restless legs syndrome   . Scoliosis   . Shingles   . Thyroid disease    Past Surgical History:  Past Surgical History:  Procedure Laterality Date  . ABDOMINAL HYSTERECTOMY    . APPENDECTOMY    . BACK SURGERY    . CHOLECYSTECTOMY    . COLONOSCOPY WITH PROPOFOL N/A 01/16/2018   Procedure: COLONOSCOPY WITH PROPOFOL;  Surgeon: Toledo, Benay Pike, MD;  Location: [facility] ENDOSCOPY;  Service: Gastroenterology;  Laterality: N/A;  . ESOPHAGOGASTRODUODENOSCOPY (EGD) WITH PROPOFOL N/A 01/16/2018   Procedure: ESOPHAGOGASTRODUODENOSCOPY (EGD) WITH PROPOFOL;  Surgeon: Toledo, Benay Pike, MD;  Location: [facility] ENDOSCOPY;  Service: Gastroenterology;  Laterality: N/A;  . JOINT REPLACEMENT Left 1998   shoulder  . NECK SURGERY Bilateral   . ROTATOR CUFF REPAIR Right   . SHOULDER OPEN ROTATOR CUFF REPAIR Right   . SHOULDER SURGERY Left    replacement   HPI:  [name] is a 74 y.o. female with medical history significant for History of COPD, asthma, hypothyroidism, hypertension, type 2 diabetes and morbid obesity who presents with concerns of worsening shortness of breath. Pt found to be septic secondary to pneumonia with acute hypoxic respiratory failure secondary to pneumonia and COPD/Asthma exacerbation.  Chst x-ray  revealed progressive basilar predominant interstitial changes throughout the lungs, most compatible with scarring. Negative for acute airspace disease. Per pt report, she expereinces a globus sensation when swallowing solids that started ~ 5 years after cervial spine surgery. She has developed compensatory strategies such as liquid wash and eliminating bread. Recent esophagram (03/22/2020) revealed a sliding-type hiatal hernia, mild dysmotility within the esophagus with tertiary contractions and no evidence of aspiration.    Assessment / Plan / Recommendation Clinical Impression  Pt is at an increased risk of aspiration given hiatal hernia and esophageal dysmotility. During this evaluation pt consumed regular diet textures and thin liquids with functional oropharyngeal abilities and no overt s/s of oropharyngeal dysphagia or aspiration. Should pt continue to experience globus sensation, she expresses desire to pursue as an Outpatient. She will request an appointment as she as time.  SLP Visit Diagnosis: Dysphagia, unspecified (R13.10)    Aspiration Risk  Mild aspiration risk    Diet Recommendation  Age appropriate regular diet textures with thin liquids   Medication Administration: Whole meds with liquid    Other  Recommendations Oral Care Recommendations: Oral care BID   Follow up Recommendations None      Frequency and Duration   N/A         Prognosis   N/A     Swallow Study   General Date of Onset: 03/21/20 HPI: [name] is a 75 y.o. female with medical history significant for History of COPD,  asthma, hypothyroidism, hypertension, type 2 diabetes and morbid obesity who presents with concerns of worsening shortness of breath. Pt found to be septic secondary to pneumonia with acute hypoxic respiratory failure secondary to pneumonia and COPD/Asthma exacerbation.  Chst x-ray revealed progressive basilar predominant interstitial changes throughout the lungs, most compatible with  scarring. Negative for acute airspace disease. Per pt report, she expereinces a globus sensation when swallowing solids that started ~ 5 years after cervial spine surgery. She has developed compensatory strategies such as liquid wash and eliminating bread. Recent esophagram (03/22/2020) revealed a sliding-type hiatal hernia, mild dysmotility within the esophagus with tertiary contractions and no evidence of aspiration.  Type of Study: Bedside Swallow Evaluation Previous Swallow Assessment: none in chart Diet Prior to this Study: Regular;Thin liquids Temperature Spikes Noted: Yes Respiratory Status: Nasal cannula History of Recent Intubation: No Behavior/Cognition: Alert;Cooperative;Pleasant mood Oral Cavity Assessment: Within Functional Limits Oral Care Completed by SLP: No Oral Cavity - Dentition: Adequate natural dentition Vision: Functional for self-feeding Self-Feeding Abilities: Able to feed self Patient Positioning: Upright in bed Baseline Vocal Quality: Normal Volitional Cough: Strong Volitional Swallow: Able to elicit    Oral/Motor/Sensory Function Overall Oral Motor/Sensory Function: Within functional limits   Ice Chips Ice chips: Not tested   Thin Liquid Thin Liquid: Within functional limits Presentation: Cup;Self Fed    Nectar Thick Nectar Thick Liquid: Not tested   Honey Thick Honey Thick Liquid: Not tested   Puree Puree: Not tested   Solid     Solid: Within functional limits Presentation: Self Fed     Cobie Marcoux B. Rutherford Nail M.S., CCC-SLP, [place] [facility] Office [phone]  Ziair Penson 03/22/2020,11:40 AM

## 2020-03-22 NOTE — Progress Notes (Signed)
[name] [place] A and O x4. VSS. Pt tolerating diet well. No complaints of nausea or vomiting. IV removed intact, prescriptions given. Pt voices understanding of discharge instructions with no further questions. Patient discharged via wheelchair with Volunteer  Allergies as of 03/22/2020      Reactions   Adhesive [tape] Other (See Comments)   Other Rash   Plastic tape      Medication List    STOP taking these medications   sodium chloride 1 g tablet     TAKE these medications   albuterol 108 (90 Base) MCG/ACT inhaler Commonly known as: VENTOLIN HFA Inhale 1-2 puffs into the lungs every 6 (six) hours as needed for wheezing or shortness of breath.   amLODipine 5 MG tablet Commonly known as: NORVASC Take 5 mg by mouth daily.   amoxicillin-clavulanate 875-125 MG tablet Commonly known as: Augmentin Take 1 tablet by mouth 2 (two) times daily for 5 days.   citalopram 10 MG tablet Commonly known as: CELEXA Take 1 tablet (10 mg total) by mouth daily.   Fluticasone-Salmeterol 250-50 MCG/DOSE Aepb Commonly known as: Advair Diskus Inhale 1 puff into the lungs 2 (two) times daily.   furosemide 20 MG tablet Commonly known as: LASIX Take 1 tablet (20 mg total) by mouth daily.   gabapentin 100 MG capsule Commonly known as: NEURONTIN Take 2 capsules (200 mg total) by mouth at bedtime.   levothyroxine 112 MCG tablet Commonly known as: SYNTHROID Take 112 mcg by mouth daily before breakfast.   meloxicam 15 MG tablet Commonly known as: MOBIC Take 15 mg by mouth daily.   metoprolol tartrate 100 MG tablet Commonly known as: LOPRESSOR Take 100 mg by mouth 2 (two) times daily.   multivitamin tablet Take 1 tablet by mouth daily. Reported on 04/05/2016   omeprazole 20 MG capsule Commonly known as: PRILOSEC Take 20 mg by mouth daily.   potassium chloride SA 20 MEQ tablet Commonly known as: KLOR-CON Take 20 mEq by mouth daily.   predniSONE 20 MG tablet Commonly known as:  Deltasone Take 1 tablet (20 mg total) by mouth daily with breakfast for 5 days, THEN 0.5 tablets (10 mg total) daily with breakfast for 5 days. Start taking on: Mar 22, 2020 What changed: See the new instructions.   tiotropium 18 MCG inhalation capsule Commonly known as: Spiriva HandiHaler Place 1 capsule (18 mcg total) into inhaler and inhale daily.   traMADol 50 MG tablet Commonly known as: Ultram Take 1 tablet (50 mg total) by mouth every 6 (six) hours as needed for moderate pain or severe pain.   Vitamin D 50 MCG (2000 UT) tablet Take 2,000 Units by mouth daily.       Vitals:   03/22/20 0741 03/22/20 1135  BP:  110/76  Pulse:  81  Resp:    Temp:    SpO2: 93%     [name]

## 2020-03-22 NOTE — Progress Notes (Signed)
VAST consulted earlier to obtain IV access; however, pt was having procedure completed. Upon arrival at pt's room, noted discharge note written by physician. SecureChat sent to Dr. Roosevelt Locks to inquire if pt needed IV and ampicillin before discharge. Dr. Roosevelt Locks responded no need to stick patient. Contacted pt's nurse and advised her of conversation with Dr. Roosevelt Locks.

## 2020-03-22 NOTE — Progress Notes (Signed)
SATURATION QUALIFICATIONS: (This note is used to comply with regulatory documentation for home oxygen)  Patient Saturations on Room Air at Rest = 92%  Patient Saturations on Room Air while Ambulating = 95%   Please briefly explain why patient needs home oxygen: Does not need

## 2020-03-22 NOTE — Progress Notes (Signed)
VAST consulted to obtain IV access. Spoke with pt's nurse regarding need for IV access. Pt is receiving IV antibiotics. However, pt is currently out of room having a procedure completed. Advised unit RN that VAST RN will place IV later this am.

## 2020-03-22 NOTE — Care Management Important Message (Signed)
Important Message  Patient Details  Name: [name] MRN: [id number] Date of Birth: May 08, 1946   Medicare Important Message Given:  Yes     Dannette Barbara 03/22/2020, 12:12 PM

## 2020-03-22 NOTE — TOC Progression Note (Signed)
Transition of Care [facility]) - Progression Note    Patient Details  Name: [name] MRN: [id number] Date of Birth: 12-May-1946  Transition of Care [facility]) CM/SW [place], LCSW Phone Number: 03/22/2020, 10:35 AM  Clinical Narrative: CSW met with patient. No supports at bedside. CSW introduced role and explained that PT recommendations would be discussed. Patient is not interested in home health at this time. She just wants to go back home to be with her husband who is currently receiving home hospice services. Patient reports that he has had a major decline over the past several days. CSW will send patient home with CMS scores for home health agencies that serve her zip code in case she changes her mind once home. Made her aware she will just have to contact her PCP with request. PT recommended a rolling walker but patient stated she already has one at home. Patient is not on home oxygen. Currently on 2 L. Will follow for qualifying saturations note to see if we can set her up with home oxygen. Patient aware she will discharge home today and said her son is working on arranging transportation home between children, grandchildren, and Designer, industrial/product. No further concerns. CSW encouraged patient to contact CSW as needed. CSW will continue to follow patient for support and facilitate return home today.     Expected Discharge Plan: Home/Self Care Barriers to Discharge: No Barriers Identified  Expected Discharge Plan and Services Expected Discharge Plan: Home/Self Care     Post Acute Care Choice: NA Living arrangements for the past 2 months: Single Family Home                                       Social Determinants of Health (SDOH) Interventions    Readmission Risk Interventions No flowsheet data found.

## 2020-03-22 NOTE — Discharge Instructions (Signed)
1.  Follow-up with PCP in 1 week. 2.  Complete antibiotics and steroid taper.

## 2020-03-22 NOTE — TOC Transition Note (Signed)
Transition of Care [facility]) - CM/SW Discharge Note   Patient Details  Name: [name] MRN: [id number] Date of Birth: Mar 09, 1946  [facility]) CM/SW Contact:  Candie Chroman, LCSW Phone Number: 03/22/2020, 11:58 AM   Clinical Narrative: Patient has orders to discharge home today. Per RN, no need for home oxygen. She stayed at 94-95% on room air. No further concerns. CSW signing off.  Final next level of care: Home/Self Care Barriers to Discharge: No Barriers Identified   Patient Goals and CMS Choice   CMS Medicare.gov Compare Post Acute Care list provided to:: Patient    Discharge Placement                Patient to be transferred to facility by: Son arranging transportation from family.   Patient and family notified of of transfer: 03/22/20  Discharge Plan and Services     Post Acute Care Choice: NA                               Social Determinants of Health (SDOH) Interventions     Readmission Risk Interventions No flowsheet data found.

## 2020-03-22 NOTE — Discharge Summary (Signed)
Physician Discharge Summary  Patient ID: [name] MRN: [id number] DOB/AGE: [birth year] 74 y.o.  Admit date: 03/19/2020 Discharge date: 03/22/2020  Admission Diagnoses:  Discharge Diagnoses:  Principal Problem:   Acute respiratory failure with hypoxia ([place]) Active Problems:   Thyroid disease   Acute exacerbation of chronic obstructive pulmonary disease (COPD) ([place])   Community acquired pneumonia   Type 2 diabetes mellitus without complication ([place])   Obesity, Class III, BMI 40-49.9 (morbid obesity) ([place])   Discharged Condition: good  Hospital Course:   Breanna Leiter Riceis a 74 y.o.femalewith medical history significant forHistory of COPD, asthma, hypothyroidism, hypertension, type 2 diabetes and morbid obesity who presents with concerns of worsening shortness of breath.  Patient currently lives in a mobile home, she has significant short of breath walking around at home at baseline. But she was not on home oxygen. She normally sleeps in a recliner at nighttime. Has not experienced any paroxysmal nocturnal dyspnea. She has a chronic dysphagia, she thought that from her acid reflex. She also has frequent choking, as a result, he was not being able to eat any bread.Her shortness of breath is worse for the last 2 weeks. She also has a cough, nonproductive. She has intermittent fever.  Upon arrival in the emergency room, patient was found to have significant leukocytosis, tachycardia, lactic acidosis. She also had a significant hypoxemia, was placed on 5 L oxygen. She has been giving IV fluid bolus, cefepime for antibiotics. She was also given steroids.  Patient condition had improved, she is currently on 1.5 L oxygen.  Short of breath has improved.  Barium esophagram did not show any esophageal stenosis or aspiration.  Patient wished to go home to take care of her dying husband, she is medically stable to be discharged.  Will obtain home oxygen evaluation.  #1. Severe  sepsis. Secondary to aspiration pneumonia. Condition had improved. Currently lactic acid level normalized. Hemodynamically stable.  #2. Acute hypoxemic respiratory failure. Secondary to aspiration pneumonia. Oxygenation improved.  We obtain home oxygen evaluation before discharge.  3. Bilateral lower lobe aspiration pneumonia. Improved.  We will continue Augmentin for next 5 days.  4. COPD exacerbation. Sinew steroid taper, continue home dose Spiriva and albuterol as needed.  Added Advair.  5. Essential hypertension. Continue home medicines.  6. Type 2 diabetes. Glucose better after taper off steroids.  7. Hyponatremia. Improved.  8. Urinary retention. No evidence of UTI.  Received Flomax.  Condition improved.   Consults: None  Significant Diagnostic Studies:  ESOPHOGRAM/BARIUM SWALLOW  TECHNIQUE: Single contrast examination was performed using  thin barium.  FLUOROSCOPY TIME:  Fluoroscopy Time:  1 minutes 12 seconds  Radiation Exposure Index (if provided by the fluoroscopic device): 40.5 mGy  Number of Acquired Spot Images: Multiple cine fluoroscopic runs  COMPARISON:  None.  FINDINGS: Swallowing mechanism and esophageal transit time are within normal limits. No aspiration is seen. A small sliding-type hiatal hernia is noted. Tertiary contractions are noted in the mid and distal esophagus. No definitive reflux or reflux esophagitis is seen. Contrast flows into the distal stomach. Barium tablet was not administered due to the patient's inability to stand.  IMPRESSION: Sliding-type hiatal hernia.  Mild dysmotility within the esophagus with tertiary contractions.  No evidence of aspiration.   Electronically Signed   By: Inez Catalina M.D.   On: 03/22/2020 08:58  CHEST - 2 VIEW  COMPARISON:  04/27/2019  FINDINGS: Frontal and lateral views of the chest demonstrate a stable cardiac silhouette. There is a large  hiatal hernia. Diffuse  interstitial prominence is seen throughout the lungs, increased since prior exam. This is most pronounced at the lung bases. No focal airspace disease, effusion, or pneumothorax. Severe right shoulder osteoarthritis has progressed. Left shoulder arthroplasty again seen.  IMPRESSION: 1. Progressive basilar predominant interstitial changes throughout the lungs, most compatible with scarring. 2. No acute airspace disease. 3. Large hiatal hernia.   Electronically Signed   By: Randa Ngo M.D.   On: 03/19/2020 21:04  Treatments: Antibiotics and steroids.  Discharge Exam: Blood pressure 119/76, pulse 74, temperature 97.7 F (36.5 C), temperature source Oral, resp. rate 20, height 4\' 11"  (1.499 m), weight 90.7 kg, SpO2 93 %. General appearance: alert and cooperative Resp: Decreased breathing sounds bilaterally, a few crackles in the right base. Cardio: Distant heart sound, no murmurs rubs or gallops. GI: soft, non-tender; bowel sounds normal; no masses,  no organomegaly Extremities: extremities normal, atraumatic, no cyanosis or edema  Disposition: Discharge disposition: 01-Home or Self Care       Discharge Instructions    Diet - low sodium heart healthy   Complete by: As directed    Increase activity slowly   Complete by: As directed      Allergies as of 03/22/2020      Reactions   Adhesive [tape] Other (See Comments)   Other Rash   Plastic tape      Medication List    STOP taking these medications   sodium chloride 1 g tablet     TAKE these medications   albuterol 108 (90 Base) MCG/ACT inhaler Commonly known as: VENTOLIN HFA Inhale 1-2 puffs into the lungs every 6 (six) hours as needed for wheezing or shortness of breath.   amLODipine 5 MG tablet Commonly known as: NORVASC Take 5 mg by mouth daily.   amoxicillin-clavulanate 875-125 MG tablet Commonly known as: Augmentin Take 1 tablet by mouth 2 (two) times daily for 5 days.   citalopram 10 MG  tablet Commonly known as: CELEXA Take 1 tablet (10 mg total) by mouth daily.   Fluticasone-Salmeterol 250-50 MCG/DOSE Aepb Commonly known as: Advair Diskus Inhale 1 puff into the lungs 2 (two) times daily.   furosemide 20 MG tablet Commonly known as: LASIX Take 1 tablet (20 mg total) by mouth daily.   gabapentin 100 MG capsule Commonly known as: NEURONTIN Take 2 capsules (200 mg total) by mouth at bedtime.   levothyroxine 112 MCG tablet Commonly known as: SYNTHROID Take 112 mcg by mouth daily before breakfast.   meloxicam 15 MG tablet Commonly known as: MOBIC Take 15 mg by mouth daily.   metoprolol tartrate 100 MG tablet Commonly known as: LOPRESSOR Take 100 mg by mouth 2 (two) times daily.   multivitamin tablet Take 1 tablet by mouth daily. Reported on 04/05/2016   omeprazole 20 MG capsule Commonly known as: PRILOSEC Take 20 mg by mouth daily.   potassium chloride SA 20 MEQ tablet Commonly known as: KLOR-CON Take 20 mEq by mouth daily.   predniSONE 20 MG tablet Commonly known as: Deltasone Take 1 tablet (20 mg total) by mouth daily with breakfast for 5 days, THEN 0.5 tablets (10 mg total) daily with breakfast for 5 days. Start taking on: Mar 22, 2020 What changed: See the new instructions.   tiotropium 18 MCG inhalation capsule Commonly known as: Spiriva HandiHaler Place 1 capsule (18 mcg total) into inhaler and inhale daily.   traMADol 50 MG tablet Commonly known as: Ultram Take 1 tablet (50 mg total) by  mouth every 6 (six) hours as needed for moderate pain or severe pain.   Vitamin D 50 MCG (2000 UT) tablet Take 2,000 Units by mouth daily.      Follow-up Information    Baxter Hire, MD Follow up in 1 week(s).   Specialty: Internal Medicine Contact information: [place] [address] [phone]         35 minutes  Signed: Sharen Hones 03/22/2020, 10:45 AM

## 2020-03-24 LAB — CULTURE, BLOOD (ROUTINE X 2)
Culture: NO GROWTH
Culture: NO GROWTH
Special Requests: ADEQUATE

## 2020-07-29 ENCOUNTER — Inpatient Hospital Stay
Admission: EM | Admit: 2020-07-29 | Discharge: 2020-07-31 | DRG: 191 | Disposition: A | Discharge status: Home Health | Payer: Medicare Other | Attending: Internal Medicine | Admitting: Internal Medicine

## 2020-07-29 ENCOUNTER — Emergency Department: Payer: Medicare Other

## 2020-07-29 ENCOUNTER — Other Ambulatory Visit: Payer: Self-pay

## 2020-07-29 DIAGNOSIS — E66813 Obesity, class 3: Secondary | ICD-10-CM | POA: Diagnosis present

## 2020-07-29 DIAGNOSIS — W19XXXA Unspecified fall, initial encounter: Secondary | ICD-10-CM | POA: Diagnosis present

## 2020-07-29 DIAGNOSIS — R0789 Other chest pain: Secondary | ICD-10-CM

## 2020-07-29 DIAGNOSIS — Z841 Family history of disorders of kidney and ureter: Secondary | ICD-10-CM

## 2020-07-29 DIAGNOSIS — J441 Chronic obstructive pulmonary disease with (acute) exacerbation: Secondary | ICD-10-CM | POA: Diagnosis not present

## 2020-07-29 DIAGNOSIS — M419 Scoliosis, unspecified: Secondary | ICD-10-CM | POA: Diagnosis present

## 2020-07-29 DIAGNOSIS — Y92009 Unspecified place in unspecified non-institutional (private) residence as the place of occurrence of the external cause: Secondary | ICD-10-CM

## 2020-07-29 DIAGNOSIS — Z7951 Long term (current) use of inhaled steroids: Secondary | ICD-10-CM

## 2020-07-29 DIAGNOSIS — Z96612 Presence of left artificial shoulder joint: Secondary | ICD-10-CM | POA: Diagnosis present

## 2020-07-29 DIAGNOSIS — E119 Type 2 diabetes mellitus without complications: Secondary | ICD-10-CM

## 2020-07-29 DIAGNOSIS — Z8249 Family history of ischemic heart disease and other diseases of the circulatory system: Secondary | ICD-10-CM

## 2020-07-29 DIAGNOSIS — E039 Hypothyroidism, unspecified: Secondary | ICD-10-CM | POA: Diagnosis present

## 2020-07-29 DIAGNOSIS — S2232XA Fracture of one rib, left side, initial encounter for closed fracture: Secondary | ICD-10-CM

## 2020-07-29 DIAGNOSIS — K219 Gastro-esophageal reflux disease without esophagitis: Secondary | ICD-10-CM | POA: Diagnosis present

## 2020-07-29 DIAGNOSIS — Z6841 Body Mass Index (BMI) 40.0 and over, adult: Secondary | ICD-10-CM

## 2020-07-29 DIAGNOSIS — Z791 Long term (current) use of non-steroidal anti-inflammatories (NSAID): Secondary | ICD-10-CM

## 2020-07-29 DIAGNOSIS — Z9181 History of falling: Secondary | ICD-10-CM

## 2020-07-29 DIAGNOSIS — I10 Essential (primary) hypertension: Secondary | ICD-10-CM | POA: Diagnosis present

## 2020-07-29 DIAGNOSIS — M810 Age-related osteoporosis without current pathological fracture: Secondary | ICD-10-CM | POA: Diagnosis present

## 2020-07-29 DIAGNOSIS — R079 Chest pain, unspecified: Secondary | ICD-10-CM

## 2020-07-29 DIAGNOSIS — G2581 Restless legs syndrome: Secondary | ICD-10-CM | POA: Diagnosis present

## 2020-07-29 DIAGNOSIS — Z888 Allergy status to other drugs, medicaments and biological substances status: Secondary | ICD-10-CM

## 2020-07-29 DIAGNOSIS — Z79890 Hormone replacement therapy (postmenopausal): Secondary | ICD-10-CM

## 2020-07-29 DIAGNOSIS — Z20822 Contact with and (suspected) exposure to covid-19: Secondary | ICD-10-CM | POA: Diagnosis present

## 2020-07-29 DIAGNOSIS — R0902 Hypoxemia: Secondary | ICD-10-CM

## 2020-07-29 DIAGNOSIS — M5136 Other intervertebral disc degeneration, lumbar region: Secondary | ICD-10-CM | POA: Diagnosis present

## 2020-07-29 DIAGNOSIS — Z833 Family history of diabetes mellitus: Secondary | ICD-10-CM

## 2020-07-29 DIAGNOSIS — Z87891 Personal history of nicotine dependence: Secondary | ICD-10-CM

## 2020-07-29 LAB — TROPONIN I (HIGH SENSITIVITY)
Troponin I (High Sensitivity): 3 ng/L (ref <18)
Troponin I (High Sensitivity): 4 ng/L (ref <18)

## 2020-07-29 LAB — BASIC METABOLIC PANEL
Anion gap: 12 (ref 5–15)
BUN: 11 mg/dL (ref 8–23)
CO2: 28 mmol/L (ref 22–32)
Calcium: 9 mg/dL (ref 8.9–10.3)
Chloride: 94 mmol/L — ABNORMAL LOW (ref 98–111)
Creatinine, Ser: 0.73 mg/dL (ref 0.44–1.00)
GFR calc Af Amer: >60 mL/min (ref >60)
GFR calc non Af Amer: >60 mL/min (ref >60)
Glucose, Bld: 110 mg/dL — ABNORMAL HIGH (ref 70–99)
Potassium: 4 mmol/L (ref 3.5–5.1)
Sodium: 134 mmol/L — ABNORMAL LOW (ref 135–145)

## 2020-07-29 LAB — CBC
HCT: 40.2 % (ref 36.0–46.0)
Hemoglobin: 12.8 g/dL (ref 12.0–15.0)
MCH: 26.4 pg (ref 26.0–34.0)
MCHC: 31.8 g/dL (ref 30.0–36.0)
MCV: 82.9 fL (ref 80.0–100.0)
Platelets: 331 10*3/uL (ref 150–400)
RBC: 4.85 MIL/uL (ref 3.87–5.11)
RDW: 17 % — ABNORMAL HIGH (ref 11.5–15.5)
WBC: 9.1 10*3/uL (ref 4.0–10.5)
nRBC: 0 % (ref 0.0–0.2)

## 2020-07-29 MED ORDER — IOHEXOL 350 MG/ML SOLN
100.0000 mL | Freq: Once | INTRAVENOUS | Status: AC | PRN
  Administered 2020-07-29: 100 mL via INTRAVENOUS

## 2020-07-29 MED ORDER — PREDNISONE 20 MG PO TABS
60.0000 mg | ORAL_TABLET | Freq: Once | ORAL | Status: AC
  Administered 2020-07-29: 60 mg via ORAL
  Filled 2020-07-29: qty 3

## 2020-07-29 MED ORDER — IPRATROPIUM-ALBUTEROL 0.5-2.5 (3) MG/3ML IN SOLN
9.0000 mL | Freq: Once | RESPIRATORY_TRACT | Status: AC
  Administered 2020-07-29: 9 mL via RESPIRATORY_TRACT
  Filled 2020-07-29: qty 3

## 2020-07-29 NOTE — ED Notes (Signed)
pts oxygen noted to be off. RN replaced at 2lpm via  as pts oxygen dropped to 89% on the room air.

## 2020-07-29 NOTE — ED Provider Notes (Signed)
[facility] Emergency Department Provider Note   ____________________________________________   First MD Initiated Contact with Patient 07/29/20 2259     (approximate)  I have reviewed the triage vital signs and the nursing notes.   HISTORY  Chief Complaint Chest Pain    HPI [name] is a 74 y.o. female with past medical history of hypertension, diabetes, COPD, and hypothyroidism who presents to the ED complaining of chest pain and shortness of breath.  Patient reports she has had about 2 days of sharp pain in the left side of her chest that is present intermittently.  It is not exacerbated or alleviated by anything in particular but has been associated with worsening in her chronic cough and shortness of breath.  Her cough has been nonproductive and she denies any fevers.  She has been using nebulizer treatments at home without significant relief.  She does not wear home oxygen at baseline.  She denies any history of DVT/PE and has not had any pain or swelling in her legs.        Past Medical History:  Diagnosis Date  . Allergy   . Asthma   . Back pain   . Bronchitis   . COPD (chronic obstructive pulmonary disease) ([place])   . Degenerative disc disease, lumbar   . Depression   . FHx: cholecystectomy   . GERD (gastroesophageal reflux disease)   . H/O: hysterectomy   . Headache   . Hypertension   . Murmur   . Osteoporosis   . Pneumonia 03/31/16   being treated by PCP  . Restless legs syndrome   . Scoliosis   . Shingles   . Thyroid disease     Patient Active Problem List   Diagnosis Date Noted  . History of recent fall 07/30/2020  . Fracture of one rib, left side, initial encounter for closed fracture 07/30/2020  . Hypoxia 07/30/2020  . COPD with acute exacerbation ([place]) 07/30/2020  . Community acquired pneumonia 03/20/2020  . Type 2 diabetes mellitus without complication ([place]) 25/85/2778  . Obesity, Class III, BMI 40-49.9 (morbid  obesity) ([place]) 03/20/2020  . Acute respiratory failure with hypoxia ([place]) 03/19/2020  . Hyponatremia 04/27/2019  . Hallucinations, visual 04/27/2019  . Iron deficiency anemia 02/01/2018  . Acute exacerbation of chronic obstructive pulmonary disease (COPD) ([place]) 11/05/2016  . Spinal stenosis, lumbar region, with neurogenic claudication 07/08/2015  . Sacroiliac joint dysfunction 06/28/2015  . Greater trochanteric bursitis 04/10/2015  . Sacroiliac joint disease 03/14/2015  . Facet syndrome, lumbar 03/14/2015  . HTN (hypertension) 03/06/2015  . Thyroid disease 03/06/2015  . DDD (degenerative disc disease), cervical 03/04/2015  . DDD (degenerative disc disease), lumbar 03/04/2015  . DJD (degenerative joint disease) of knee 03/04/2015    Past Surgical History:  Procedure Laterality Date  . ABDOMINAL HYSTERECTOMY    . APPENDECTOMY    . BACK SURGERY    . CHOLECYSTECTOMY    . COLONOSCOPY WITH PROPOFOL N/A 01/16/2018   Procedure: COLONOSCOPY WITH PROPOFOL;  Surgeon: Toledo, Benay Pike, MD;  Location: [facility] ENDOSCOPY;  Service: Gastroenterology;  Laterality: N/A;  . ESOPHAGOGASTRODUODENOSCOPY (EGD) WITH PROPOFOL N/A 01/16/2018   Procedure: ESOPHAGOGASTRODUODENOSCOPY (EGD) WITH PROPOFOL;  Surgeon: Toledo, Benay Pike, MD;  Location: [facility] ENDOSCOPY;  Service: Gastroenterology;  Laterality: N/A;  . JOINT REPLACEMENT Left 1998   shoulder  . NECK SURGERY Bilateral   . ROTATOR CUFF REPAIR Right   . SHOULDER OPEN ROTATOR CUFF REPAIR Right   . SHOULDER SURGERY Left  replacement    Prior to Admission medications   Medication Sig Start Date End Date Taking? Authorizing Provider  albuterol (PROVENTIL HFA;VENTOLIN HFA) 108 (90 Base) MCG/ACT inhaler Inhale 1-2 puffs into the lungs every 6 (six) hours as needed for wheezing or shortness of breath.    [provider]  amLODipine (NORVASC) 5 MG tablet Take 5 mg by mouth daily.    [provider]  Cholecalciferol (VITAMIN D) 2000 UNITS  tablet Take 2,000 Units by mouth daily.    [provider]  citalopram (CELEXA) 10 MG tablet Take 1 tablet (10 mg total) by mouth daily. 05/06/19   Vaughan Basta, MD  Fluticasone-Salmeterol (ADVAIR DISKUS) 250-50 MCG/DOSE AEPB Inhale 1 puff into the lungs 2 (two) times daily. 03/22/20 03/22/21  Sharen Hones, MD  furosemide (LASIX) 20 MG tablet Take 1 tablet (20 mg total) by mouth daily. 05/06/19   Vaughan Basta, MD  gabapentin (NEURONTIN) 100 MG capsule Take 2 capsules (200 mg total) by mouth at bedtime. 05/05/19   Vaughan Basta, MD  levothyroxine (SYNTHROID, LEVOTHROID) 112 MCG tablet Take 112 mcg by mouth daily before breakfast.    [provider]  meloxicam (MOBIC) 15 MG tablet Take 15 mg by mouth daily. 04/22/19   [provider]  metoprolol (LOPRESSOR) 100 MG tablet Take 100 mg by mouth 2 (two) times daily.    [provider]  Multiple Vitamin (MULTIVITAMIN) tablet Take 1 tablet by mouth daily. Reported on 04/05/2016    [provider]  omeprazole (PRILOSEC) 20 MG capsule Take 20 mg by mouth daily.     [provider]  potassium chloride SA (K-DUR,KLOR-CON) 20 MEQ tablet Take 20 mEq by mouth daily.    [provider]  tiotropium (SPIRIVA HANDIHALER) 18 MCG inhalation capsule Place 1 capsule (18 mcg total) into inhaler and inhale daily. Patient not taking: Reported on 03/20/2020 11/14/17   Laverle Hobby, MD  traMADol (ULTRAM) 50 MG tablet Take 1 tablet (50 mg total) by mouth every 6 (six) hours as needed for moderate pain or severe pain. 04/21/19   Delman Kitten, MD    Allergies Adhesive [tape] and Other  Family History  Problem Relation Age of Onset  . Kidney disease Father   . Heart disease Mother   . Diabetes Mother     Social History Social History   Tobacco Use  . Smoking status: Former Smoker    Packs/day: 1.00    Years: 30.00    Pack years: 30.00    Types: Cigarettes    Quit date: 11/04/2016      Years since quitting: 3.7  . Smokeless tobacco: Never Used  Vaping Use  . Vaping Use: Never used  Substance Use Topics  . Alcohol use: No    Alcohol/week: 0.0 standard drinks  . Drug use: No    Review of Systems  Constitutional: No fever/chills Eyes: No visual changes. ENT: No sore throat. Cardiovascular: Positive for chest pain. Respiratory: Positive for cough and shortness of breath. Gastrointestinal: No abdominal pain.  No nausea, no vomiting.  No diarrhea.  No constipation. Genitourinary: Negative for dysuria. Musculoskeletal: Negative for back pain. Skin: Negative for rash. Neurological: Negative for headaches, focal weakness or numbness.  ____________________________________________   PHYSICAL EXAM:  VITAL SIGNS: ED Triage Vitals [07/29/20 1213]  Enc Vitals Group     BP (!) 137/94     Pulse Rate 71     Resp 20     Temp 98.3 F (36.8 C)  Temp Source Oral     SpO2 100 %     Weight 208 lb (94.3 kg)     Height 4\' 10"  (1.473 m)     Head Circumference      Peak Flow      Pain Score 7     Pain Loc      Pain Edu?      Excl. in [place]?     Constitutional: Alert and oriented. Eyes: Conjunctivae are normal. Head: Atraumatic. Nose: No congestion/rhinnorhea. Mouth/Throat: Mucous membranes are moist. Neck: Normal ROM Cardiovascular: Normal rate, regular rhythm. Grossly normal heart sounds. Respiratory: Tachypneic with increased respiratory effort.  No retractions. Lungs with expiratory wheezing throughout. Gastrointestinal: Soft and nontender. No distention. Genitourinary: deferred Musculoskeletal: No lower extremity tenderness nor edema. Neurologic:  Normal speech and language. No gross focal neurologic deficits are appreciated. Skin:  Skin is warm, dry and intact. No rash noted. Psychiatric: Mood and affect are normal. Speech and behavior are normal.  ____________________________________________   LABS (all labs ordered are listed, but only abnormal  results are displayed)  Labs Reviewed  BASIC METABOLIC PANEL - Abnormal; Notable for the following components:      Result Value   Sodium 134 (*)    Chloride 94 (*)    Glucose, Bld 110 (*)    All other components within normal limits  CBC - Abnormal; Notable for the following components:   RDW 17.0 (*)    All other components within normal limits  RESPIRATORY PANEL BY RT PCR (FLU A&B, COVID)  TROPONIN I (HIGH SENSITIVITY)  TROPONIN I (HIGH SENSITIVITY)   ____________________________________________  EKG  ED ECG REPORT I, Blake Divine, the attending physician, personally viewed and interpreted this ECG.   Date: 07/29/2020  EKG Time: 12:03  Rate: 73  Rhythm: normal sinus rhythm  Axis: LAD  Intervals:none  ST&T Change: None   PROCEDURES  Procedure(s) performed (including Critical Care):  Procedures   ____________________________________________   INITIAL IMPRESSION / ASSESSMENT AND PLAN / ED COURSE       74 year old female with possible history of hypertension, diabetes, COPD, and hypothyroidism presents to the ED complaining of intermittent sharp pain to the left side of her chest for the past 2 days that has been associated with an increase in her difficulty breathing.  She was initially on 2 L nasal cannula, but now with oxygen turned off O2 sats are consistently around 92% on room air.  She does have significant wheezing on exam and we will treat with prednisone and DuoNeb.  EKG shows no evidence of arrhythmia or ischemia and 2 sets of troponin are negative, I have a very low suspicion for ACS.  Chest x-ray does show possible left lower lobe infiltrate in the area of her pain, which we will further assess with CTA to rule out PE or pneumonia.  CTA is negative for PE, does show left-sided rib fracture which could explain patient's pain.  She has tenderness on exam reproducing pain, additionally reports a fall 5 to 6 days ago.  CT also shows large hiatal hernia and  possible malignancy the patient will need to follow-up as an outpatient with PET scan.  On reassessment, she continues to have significant wheezing and has dropped O2 sats to 88%, subsequently placed on 2 L nasal cannula.  Case discussed with hospitalist for admission for further management of COPD exacerbation.      ____________________________________________   FINAL CLINICAL IMPRESSION(S) / ED DIAGNOSES  Final diagnoses:  COPD exacerbation (  [place])  Chest pain, unspecified type  Closed fracture of one rib of left side, initial encounter     ED Discharge Orders    None       Note:  This document was prepared using Dragon voice recognition software and may include unintentional dictation errors.   Blake Divine, MD 07/30/20 [phone]

## 2020-07-29 NOTE — ED Notes (Signed)
Pt states coming in for left sided chest pain that started last night. Pt states she became nervous because her husband passed away after having heart problems this past may. Pt states she uses nebulizer treatments at home, but does not use oxygen on a regular basis at home. Pt noted to be on 2lpm via [place] of oxygen.

## 2020-07-29 NOTE — ED Triage Notes (Signed)
Pt here with CP that started last night. Pain is more on the left side, pt describes as sharp. Pt has hx of COPD and was placed on 2L. Pt NAD in triage.

## 2020-07-30 ENCOUNTER — Encounter: Payer: Self-pay | Admitting: Internal Medicine

## 2020-07-30 ENCOUNTER — Other Ambulatory Visit: Payer: Self-pay

## 2020-07-30 DIAGNOSIS — R0789 Other chest pain: Secondary | ICD-10-CM

## 2020-07-30 DIAGNOSIS — R079 Chest pain, unspecified: Secondary | ICD-10-CM | POA: Diagnosis present

## 2020-07-30 DIAGNOSIS — Z791 Long term (current) use of non-steroidal anti-inflammatories (NSAID): Secondary | ICD-10-CM | POA: Diagnosis not present

## 2020-07-30 DIAGNOSIS — I10 Essential (primary) hypertension: Secondary | ICD-10-CM | POA: Diagnosis present

## 2020-07-30 DIAGNOSIS — Z9181 History of falling: Secondary | ICD-10-CM

## 2020-07-30 DIAGNOSIS — J441 Chronic obstructive pulmonary disease with (acute) exacerbation: Secondary | ICD-10-CM | POA: Diagnosis present

## 2020-07-30 DIAGNOSIS — Z96612 Presence of left artificial shoulder joint: Secondary | ICD-10-CM | POA: Diagnosis present

## 2020-07-30 DIAGNOSIS — Z8249 Family history of ischemic heart disease and other diseases of the circulatory system: Secondary | ICD-10-CM | POA: Diagnosis not present

## 2020-07-30 DIAGNOSIS — Z888 Allergy status to other drugs, medicaments and biological substances status: Secondary | ICD-10-CM | POA: Diagnosis not present

## 2020-07-30 DIAGNOSIS — G2581 Restless legs syndrome: Secondary | ICD-10-CM | POA: Diagnosis present

## 2020-07-30 DIAGNOSIS — Y92009 Unspecified place in unspecified non-institutional (private) residence as the place of occurrence of the external cause: Secondary | ICD-10-CM | POA: Diagnosis not present

## 2020-07-30 DIAGNOSIS — Z20822 Contact with and (suspected) exposure to covid-19: Secondary | ICD-10-CM | POA: Diagnosis present

## 2020-07-30 DIAGNOSIS — W19XXXA Unspecified fall, initial encounter: Secondary | ICD-10-CM | POA: Diagnosis present

## 2020-07-30 DIAGNOSIS — M419 Scoliosis, unspecified: Secondary | ICD-10-CM | POA: Diagnosis present

## 2020-07-30 DIAGNOSIS — Z841 Family history of disorders of kidney and ureter: Secondary | ICD-10-CM | POA: Diagnosis not present

## 2020-07-30 DIAGNOSIS — R0902 Hypoxemia: Secondary | ICD-10-CM

## 2020-07-30 DIAGNOSIS — Z833 Family history of diabetes mellitus: Secondary | ICD-10-CM | POA: Diagnosis not present

## 2020-07-30 DIAGNOSIS — Z7951 Long term (current) use of inhaled steroids: Secondary | ICD-10-CM | POA: Diagnosis not present

## 2020-07-30 DIAGNOSIS — S2232XA Fracture of one rib, left side, initial encounter for closed fracture: Secondary | ICD-10-CM | POA: Diagnosis present

## 2020-07-30 DIAGNOSIS — K219 Gastro-esophageal reflux disease without esophagitis: Secondary | ICD-10-CM | POA: Diagnosis present

## 2020-07-30 DIAGNOSIS — Z87891 Personal history of nicotine dependence: Secondary | ICD-10-CM | POA: Diagnosis not present

## 2020-07-30 DIAGNOSIS — M810 Age-related osteoporosis without current pathological fracture: Secondary | ICD-10-CM | POA: Diagnosis present

## 2020-07-30 DIAGNOSIS — E119 Type 2 diabetes mellitus without complications: Secondary | ICD-10-CM | POA: Diagnosis present

## 2020-07-30 DIAGNOSIS — Z6841 Body Mass Index (BMI) 40.0 and over, adult: Secondary | ICD-10-CM | POA: Diagnosis not present

## 2020-07-30 DIAGNOSIS — E039 Hypothyroidism, unspecified: Secondary | ICD-10-CM | POA: Diagnosis present

## 2020-07-30 DIAGNOSIS — M5136 Other intervertebral disc degeneration, lumbar region: Secondary | ICD-10-CM | POA: Diagnosis present

## 2020-07-30 DIAGNOSIS — Z79890 Hormone replacement therapy (postmenopausal): Secondary | ICD-10-CM | POA: Diagnosis not present

## 2020-07-30 LAB — CBC
HCT: 38.9 % (ref 36.0–46.0)
Hemoglobin: 11.9 g/dL — ABNORMAL LOW (ref 12.0–15.0)
MCH: 25.9 pg — ABNORMAL LOW (ref 26.0–34.0)
MCHC: 30.6 g/dL (ref 30.0–36.0)
MCV: 84.7 fL (ref 80.0–100.0)
Platelets: 286 10*3/uL (ref 150–400)
RBC: 4.59 MIL/uL (ref 3.87–5.11)
RDW: 16.8 % — ABNORMAL HIGH (ref 11.5–15.5)
WBC: 13 10*3/uL — ABNORMAL HIGH (ref 4.0–10.5)
nRBC: 0 % (ref 0.0–0.2)

## 2020-07-30 LAB — CREATININE, SERUM
Creatinine, Ser: 0.71 mg/dL (ref 0.44–1.00)
GFR calc Af Amer: >60 mL/min (ref >60)
GFR calc non Af Amer: >60 mL/min (ref >60)

## 2020-07-30 LAB — GLUCOSE, CAPILLARY
Glucose-Capillary: 139 mg/dL — ABNORMAL HIGH (ref 70–99)
Glucose-Capillary: 159 mg/dL — ABNORMAL HIGH (ref 70–99)
Glucose-Capillary: 164 mg/dL — ABNORMAL HIGH (ref 70–99)
Glucose-Capillary: 166 mg/dL — ABNORMAL HIGH (ref 70–99)
Glucose-Capillary: 214 mg/dL — ABNORMAL HIGH (ref 70–99)

## 2020-07-30 LAB — HEMOGLOBIN A1C
Hgb A1c MFr Bld: 5.6 % (ref 4.8–5.6)
Mean Plasma Glucose: 114.02 mg/dL

## 2020-07-30 LAB — RESPIRATORY PANEL BY RT PCR (FLU A&B, COVID)
Influenza A by PCR: NEGATIVE
Influenza B by PCR: NEGATIVE
SARS Coronavirus 2 by RT PCR: NEGATIVE

## 2020-07-30 LAB — HIV ANTIBODY (ROUTINE TESTING W REFLEX): HIV Screen 4th Generation wRfx: NONREACTIVE

## 2020-07-30 MED ORDER — METHYLPREDNISOLONE SODIUM SUCC 40 MG IJ SOLR
40.0000 mg | Freq: Four times a day (QID) | INTRAMUSCULAR | Status: AC
  Administered 2020-07-30 – 2020-07-31 (×4): 40 mg via INTRAVENOUS
  Filled 2020-07-30 (×4): qty 1

## 2020-07-30 MED ORDER — METHYLPREDNISOLONE SODIUM SUCC 40 MG IJ SOLR: 40.0000 mg | Freq: Four times a day (QID) | INTRAMUSCULAR | Status: DC

## 2020-07-30 MED ORDER — OXYCODONE HCL 5 MG PO TABS
5.0000 mg | ORAL_TABLET | Freq: Four times a day (QID) | ORAL | Status: DC | PRN
  Administered 2020-07-31 (×2): 5 mg via ORAL
  Filled 2020-07-30 (×2): qty 1

## 2020-07-30 MED ORDER — ACETAMINOPHEN 500 MG PO TABS
1000.0000 mg | ORAL_TABLET | Freq: Once | ORAL | Status: AC
  Administered 2020-07-30: 1000 mg via ORAL
  Filled 2020-07-30: qty 2

## 2020-07-30 MED ORDER — INSULIN ASPART 100 UNIT/ML ~~LOC~~ SOLN: 0.0000 [IU] | Freq: Every day | SUBCUTANEOUS | Status: DC

## 2020-07-30 MED ORDER — PREDNISONE 20 MG PO TABS: 40.0000 mg | ORAL_TABLET | Freq: Every day | ORAL | Status: DC

## 2020-07-30 MED ORDER — INSULIN ASPART 100 UNIT/ML ~~LOC~~ SOLN
0.0000 [IU] | Freq: Three times a day (TID) | SUBCUTANEOUS | Status: DC
  Administered 2020-07-30: 7 [IU] via SUBCUTANEOUS
  Administered 2020-07-30 (×2): 4 [IU] via SUBCUTANEOUS
  Administered 2020-07-31 (×2): 3 [IU] via SUBCUTANEOUS
  Filled 2020-07-30 (×5): qty 1

## 2020-07-30 MED ORDER — MORPHINE SULFATE (PF) 2 MG/ML IV SOLN
2.0000 mg | Freq: Four times a day (QID) | INTRAVENOUS | Status: DC | PRN
  Administered 2020-07-30: 2 mg via INTRAVENOUS
  Filled 2020-07-30: qty 1

## 2020-07-30 MED ORDER — LIDOCAINE 5 % EX PTCH
1.0000 | MEDICATED_PATCH | CUTANEOUS | Status: DC
  Administered 2020-07-30: 1 via TRANSDERMAL
  Filled 2020-07-30 (×2): qty 1

## 2020-07-30 MED ORDER — PREDNISONE 20 MG PO TABS
40.0000 mg | ORAL_TABLET | Freq: Every day | ORAL | Status: DC
  Administered 2020-07-31: 40 mg via ORAL
  Filled 2020-07-30: qty 2

## 2020-07-30 MED ORDER — ENOXAPARIN SODIUM 40 MG/0.4ML ~~LOC~~ SOLN
40.0000 mg | SUBCUTANEOUS | Status: DC
  Administered 2020-07-30: 40 mg via SUBCUTANEOUS
  Filled 2020-07-30: qty 0.4

## 2020-07-30 MED ORDER — INFLUENZA VAC A&B SA ADJ QUAD 0.5 ML IM PRSY
0.5000 mL | PREFILLED_SYRINGE | INTRAMUSCULAR | Status: DC | PRN
  Filled 2020-07-30: qty 0.5

## 2020-07-30 MED ORDER — ALBUTEROL SULFATE (2.5 MG/3ML) 0.083% IN NEBU
2.5000 mg | INHALATION_SOLUTION | Freq: Once | RESPIRATORY_TRACT | Status: AC
  Administered 2020-07-30: 2.5 mg via RESPIRATORY_TRACT
  Filled 2020-07-30: qty 3

## 2020-07-30 MED ORDER — IPRATROPIUM-ALBUTEROL 0.5-2.5 (3) MG/3ML IN SOLN
3.0000 mL | Freq: Four times a day (QID) | RESPIRATORY_TRACT | Status: DC
  Administered 2020-07-30 – 2020-07-31 (×3): 3 mL via RESPIRATORY_TRACT
  Filled 2020-07-30 (×2): qty 3

## 2020-07-30 MED ORDER — ALBUTEROL SULFATE (2.5 MG/3ML) 0.083% IN NEBU
2.5000 mg | INHALATION_SOLUTION | RESPIRATORY_TRACT | Status: DC | PRN
  Administered 2020-07-30: 2.5 mg via RESPIRATORY_TRACT
  Filled 2020-07-30: qty 3

## 2020-07-30 NOTE — ED Notes (Addendum)
Pt noted to still be wheezing. Pt refuses to sit up in bed, but wants to lay almost flat. Pt back from CT and pt was able to ambulate to bathroom with one assist. Pt noted to become winded when walking. Pt was on 2LPM of oxygen while walking

## 2020-07-30 NOTE — ED Notes (Signed)
Assisting primary RN, pt ambulated with walker to toilet in ED room. Medicated per MAR. This RN assisted pt back into liter. AOx4, call bell within reach, side rails up x2

## 2020-07-30 NOTE — Evaluation (Signed)
Occupational Therapy Evaluation Patient Details Name: [name] MRN: [id number] DOB: [birth year] Today's Date: 07/30/2020    History of Present Illness  Pt is a 74 y.o. female with medical history significant for COPD, HTN, hypothyroidism, GERD, DM, DDD who presents to the ER, with left-sided sharp chest pain, associated with worsening in her chronic cough and shortness of breath. CT showed  show a nondisplaced fracture of the anterior third left rib of indeterminate age not present on prior study as well as a chronic compression fracture at T12. Workup showed acute exacerbation of COPD and atypical chest pain.    Clinical Impression   [name] was seen for OT evaluation this date. Prior to hospital admission, pt was MOD I for mobility using 4WW and required assistance from caregiver for IADLs and LBD/bathing. Pt lives with live-in caregiver in mobile home c ramped entrance. Pt presents to acute OT demonstrating impaired ADL performance and functional mobility 2/2 functional ROM/balance deficits, decreased activity tolerance, and decreased safety awareness. Upon entry pt noted to have [place] removed - SpO2 93% on RA, increased to 95% on 2L . O2 removed for toilet transfer per RN in room, pt maintained SpO2 96% on RA. Pt currently requires MIN A + HHA for toilet t/f, CGA for perihygiene, MIN A for underwear mgmt in standing - assist to pull over rear/balance. MAX A for LBD at bed level - pt too short to safely attempt LBD sitting at EOB. Pt would benefit from skilled OT to address noted impairments and functional limitations (see below for any additional details) in order to maximize safety and independence while minimizing falls risk and caregiver burden. Upon hospital discharge, recommend HHOT to maximize pt safety and return to functional independence during meaningful occupations of daily life.     Follow Up Recommendations  Home health OT;Supervision/Assistance - 24 hour    Equipment  Recommendations  None recommended by OT    Recommendations for Other Services       Precautions / Restrictions Precautions Precautions: Fall Precaution Comments: L rib fx  Restrictions Weight Bearing Restrictions: No      Mobility Bed Mobility Overal bed mobility: Needs Assistance Bed Mobility: Supine to Sit;Sit to Supine     Supine to sit: Min assist Sit to supine: Min guard   General bed mobility comments: High stretcher height   Transfers Overall transfer level: Needs assistance Equipment used: 1 person hand held assist Transfers: Sit to/from Stand Sit to Stand: From elevated surface;Min assist      General transfer comment: Asisst for balance - pt report disliking using rolator (at bedside) prefering to use HHA this date - noted to furniture walk, educated on importance of rolator for safety/falls prevention     Balance Overall balance assessment: Needs assistance Sitting-balance support: Feet unsupported;Single extremity supported Sitting balance-Leahy Scale: Fair     Standing balance support: Bilateral upper extremity supported;During functional activity Standing balance-Leahy Scale: Fair Standing balance comment: Initial poor balance requiring assist for static standing improving to fair         ADL either performed or assessed with clinical judgement   ADL Overall ADL's : Needs assistance/impaired       General ADL Comments: MOD I self-feeding at bed level. MIN A + HHA for toilet t/f, CGA for perihygiene, MIN A for underwear mgmt in standing - assist to pull over rear/balance. MAX A for LBD at bed level - pt too short to safely attempt LBD sitting at EOB.  Vision Baseline Vision/History: Wears glasses Wears Glasses: At all times              Pertinent Vitals/Pain Pain Assessment: Faces Faces Pain Scale: Hurts a little bit Pain Location: L rib Pain Descriptors / Indicators: Discomfort;Dull Pain Intervention(s): Limited activity within  patient's tolerance;Monitored during session;Repositioned     Hand Dominance Right   Extremity/Trunk Assessment Upper Extremity Assessment Upper Extremity Assessment: RUE deficits/detail;LUE deficits/detail RUE Deficits / Details: AROM shoulder flexion limited ~90* , grossly 4-/5  LUE Deficits / Details: AROM shoulder flexion limited ~90* , grossly 4-/5    Lower Extremity Assessment Lower Extremity Assessment: Generalized weakness       Communication Communication Communication: No difficulties   Cognition Arousal/Alertness: Awake/alert Behavior During Therapy: WFL for tasks assessed/performed Overall Cognitive Status: Within Functional Limits for tasks assessed           General Comments  Found bedlevel [place] removed: 93% on RA, 2L [place] re-applied - resolved to 95%. Ambulation: 96% SpO2 on RA, left on RA c [place] in reach - pt noted to be wheezing t/o toilet t/f c HR increased to high 120s, SpO2 stable    Exercises Exercises: Other exercises Other Exercises Other Exercises: Pt educated re: OT role, DME recs, d/c recs, falls prevention, ECS, home/routines modifications Other Exercises: LBD, toileting, self-feeding, sup<>sit, sit<>stand, sitting/standing balance/tolerance        Home Living Family/patient expects to be discharged to:: Private residence Living Arrangements: Other (Comment) (live in caregiver ) Available Help at Discharge: Personal care attendant;Available 24 hours/day Type of Home: Mobile home Home Access: Ramped entrance     Home Layout: One level         Bathroom Toilet: Standard     Home Equipment: Walker - 4 wheels;Toilet riser;Tub bench;Grab bars - toilet          Prior Functioning/Environment Level of Independence: Needs assistance  Gait / Transfers Assistance Needed: MOD I household mobility c rolator  ADL's / Homemaking Assistance Needed: live in caregiver for IADLs and MIN A bathing/LBD            OT Problem List: Decreased range of  motion;Decreased activity tolerance;Impaired balance (sitting and/or standing)      OT Treatment/Interventions: Self-care/ADL training;Therapeutic exercise;Energy conservation;DME and/or AE instruction;Therapeutic activities;Patient/family education;Balance training    OT Goals(Current goals can be found in the care plan section) Acute Rehab OT Goals Patient Stated Goal: To return home  OT Goal Formulation: With patient Time For Goal Achievement: 08/13/20 Potential to Achieve Goals: Good ADL Goals Pt Will Perform Grooming: with supervision;standing (c LRAD PRN) Pt Will Transfer to Toilet: with supervision;ambulating;bedside commode (c LRAD PRN) Additional ADL Goal #1: Pt will Independently verbalize plan to implement x3 ECS  OT Frequency: Min 1X/week    AM-PAC OT "6 Clicks" Daily Activity     Outcome Measure Help from another person eating meals?: None Help from another person taking care of personal grooming?: A Little Help from another person toileting, which includes using toliet, bedpan, or urinal?: A Little Help from another person bathing (including washing, rinsing, drying)?: A Lot Help from another person to put on and taking off regular upper body clothing?: A Little Help from another person to put on and taking off regular lower body clothing?: A Lot 6 Click Score: 17   End of Session Equipment Utilized During Treatment: Oxygen Nurse Communication: Mobility status;Other (comment) (O2 stable and removed - [place] in reach)  Activity Tolerance: Patient tolerated treatment well Patient left:  in bed;with call bell/phone within reach  OT Visit Diagnosis: Other abnormalities of gait and mobility (R26.89)                Time: 8288-3374 OT Time Calculation (min): 20 min Charges:  OT General Charges $OT Visit: 1 Visit OT Evaluation $OT Eval Low Complexity: 1 Low OT Treatments $Self Care/Home Management : 8-22 mins  Dessie Coma, M.S. OTR/L  07/30/20, 11:19 AM  ascom  [phone]

## 2020-07-30 NOTE — H&P (Signed)
History and Physical    [name] DOB: [birth year] DOA: 07/29/2020  PCP: Baxter Hire, MD   Patient coming from: home  I have personally briefly reviewed patient's old medical records in [place]  Chief Complaint: Shortness of breath  HPI: [name] is a 74 y.o. female with medical history significant for COPD, HTN, hypothyroidism, GERD, DM, DDD who presents to the ER, with left-sided sharp chest pain, associated with worsening in her chronic cough and shortness of breath.  She denies nausea, vomiting, diaphoresis, lightheadedness or or palpitations.  Denies fever or chills.  Admits that she fell a few days prior.  Has been using home nebulizer treatments without significant improvement.  Denies any pain and swelling in the legs. ED Course: On arrival, vitals were within normal limits, though with borderline low O2 sat of 93 for which she was placed on 2 L.  She had 2 negative troponins 3>>4.  With otherwise normal blood work EKG as reviewed by me : Normal sinus rhythm with rate of 73 with no acute ST-T wave changes CTA chest revealed no evidence of PE but did show a nondisplaced fracture of the anterior third left rib of indeterminate age not present on prior study as well as a chronic compression fracture at T12.  Patient was treated with duo nebs.  Hospitalist consulted for admission.  Review of Systems: As per HPI otherwise all other systems on review of systems negative.    Past Medical History:  Diagnosis Date  . Allergy   . Asthma   . Back pain   . Bronchitis   . COPD (chronic obstructive pulmonary disease) ([place])   . Degenerative disc disease, lumbar   . Depression   . FHx: cholecystectomy   . GERD (gastroesophageal reflux disease)   . H/O: hysterectomy   . Headache   . Hypertension   . Murmur   . Osteoporosis   . Pneumonia 03/31/16   being treated by PCP  . Restless legs syndrome   . Scoliosis   . Shingles   . Thyroid disease      Past Surgical History:  Procedure Laterality Date  . ABDOMINAL HYSTERECTOMY    . APPENDECTOMY    . BACK SURGERY    . CHOLECYSTECTOMY    . COLONOSCOPY WITH PROPOFOL N/A 01/16/2018   Procedure: COLONOSCOPY WITH PROPOFOL;  Surgeon: Toledo, Benay Pike, MD;  Location: [facility] ENDOSCOPY;  Service: Gastroenterology;  Laterality: N/A;  . ESOPHAGOGASTRODUODENOSCOPY (EGD) WITH PROPOFOL N/A 01/16/2018   Procedure: ESOPHAGOGASTRODUODENOSCOPY (EGD) WITH PROPOFOL;  Surgeon: Toledo, Benay Pike, MD;  Location: [facility] ENDOSCOPY;  Service: Gastroenterology;  Laterality: N/A;  . JOINT REPLACEMENT Left 1998   shoulder  . NECK SURGERY Bilateral   . ROTATOR CUFF REPAIR Right   . SHOULDER OPEN ROTATOR CUFF REPAIR Right   . SHOULDER SURGERY Left    replacement     reports that she quit smoking about 3 years ago. Her smoking use included cigarettes. She has a 30.00 pack-year smoking history. She has never used smokeless tobacco. She reports that she does not drink alcohol and does not use drugs.  Allergies  Allergen Reactions  . Adhesive [Tape] Other (See Comments)  . Other Rash    Plastic tape    Family History  Problem Relation Age of Onset  . Kidney disease Father   . Heart disease Mother   . Diabetes Mother       Prior to Admission medications   Medication Sig Start  Date End Date Taking? Authorizing Provider  albuterol (PROVENTIL HFA;VENTOLIN HFA) 108 (90 Base) MCG/ACT inhaler Inhale 1-2 puffs into the lungs every 6 (six) hours as needed for wheezing or shortness of breath.    [provider]  amLODipine (NORVASC) 5 MG tablet Take 5 mg by mouth daily.    [provider]  Cholecalciferol (VITAMIN D) 2000 UNITS tablet Take 2,000 Units by mouth daily.    [provider]  citalopram (CELEXA) 10 MG tablet Take 1 tablet (10 mg total) by mouth daily. 05/06/19   Vaughan Basta, MD  Fluticasone-Salmeterol (ADVAIR DISKUS) 250-50 MCG/DOSE AEPB Inhale 1 puff into the lungs 2  (two) times daily. 03/22/20 03/22/21  Sharen Hones, MD  furosemide (LASIX) 20 MG tablet Take 1 tablet (20 mg total) by mouth daily. 05/06/19   Vaughan Basta, MD  gabapentin (NEURONTIN) 100 MG capsule Take 2 capsules (200 mg total) by mouth at bedtime. 05/05/19   Vaughan Basta, MD  levothyroxine (SYNTHROID, LEVOTHROID) 112 MCG tablet Take 112 mcg by mouth daily before breakfast.    [provider]  meloxicam (MOBIC) 15 MG tablet Take 15 mg by mouth daily. 04/22/19   [provider]  metoprolol (LOPRESSOR) 100 MG tablet Take 100 mg by mouth 2 (two) times daily.    [provider]  Multiple Vitamin (MULTIVITAMIN) tablet Take 1 tablet by mouth daily. Reported on 04/05/2016    [provider]  omeprazole (PRILOSEC) 20 MG capsule Take 20 mg by mouth daily.     [provider]  potassium chloride SA (K-DUR,KLOR-CON) 20 MEQ tablet Take 20 mEq by mouth daily.    [provider]  tiotropium (SPIRIVA HANDIHALER) 18 MCG inhalation capsule Place 1 capsule (18 mcg total) into inhaler and inhale daily. Patient not taking: Reported on 03/20/2020 11/14/17   Laverle Hobby, MD  traMADol (ULTRAM) 50 MG tablet Take 1 tablet (50 mg total) by mouth every 6 (six) hours as needed for moderate pain or severe pain. 04/21/19   Delman Kitten, MD    Physical Exam: Vitals:   07/29/20 1759 07/29/20 1802 07/29/20 2242 07/30/20 0030  BP: (!) 152/93 (!) 152/93 (!) 166/95 115/60  Pulse:  76 81 89  Resp: (!) 22 (!) 22 (!) 22 18  Temp: 98.3 F (36.8 C) 98.3 F (36.8 C)    TempSrc: Oral     SpO2: 100% 100% 95% 97%  Weight:      Height:         Vitals:   07/29/20 1759 07/29/20 1802 07/29/20 2242 07/30/20 0030  BP: (!) 152/93 (!) 152/93 (!) 166/95 115/60  Pulse:  76 81 89  Resp: (!) 22 (!) 22 (!) 22 18  Temp: 98.3 F (36.8 C) 98.3 F (36.8 C)    TempSrc: Oral     SpO2: 100% 100% 95% 97%  Weight:      Height:          Constitutional: Alert and  oriented x 3 .  Conversational dyspnea with audible wheezing HEENT:      Head: Normocephalic and atraumatic.         Eyes: PERLA, EOMI, Conjunctivae are normal. Sclera is non-icteric.       Mouth/Throat: Mucous membranes are moist.       Neck: Supple with no signs of meningismus. Cardiovascular: Regular rate and rhythm. No murmurs, gallops, or rubs. 2+ symmetrical distal pulses are present . No JVD. No LE edema Respiratory: Respiratory effort increased with rhonchi and wheezes throughout both  lung fields, increased work of breathing Gastrointestinal: Soft, non tender, and non distended with positive bowel sounds. No rebound or guarding. Genitourinary: No CVA tenderness. Musculoskeletal: Nontender with normal range of motion in all extremities. No cyanosis, or erythema of extremities. Neurologic:  Face is symmetric. Moving all extremities. No gross focal neurologic deficits . Skin: Skin is warm, dry.  No rash or ulcers Psychiatric: Mood and affect are normal    Labs on Admission: I have personally reviewed following labs and imaging studies  CBC: Recent Labs  Lab 07/29/20 1218  WBC 9.1  HGB 12.8  HCT 40.2  MCV 82.9  PLT 536   Basic Metabolic Panel: Recent Labs  Lab 07/29/20 1218  NA 134*  K 4.0  CL 94*  CO2 28  GLUCOSE 110*  BUN 11  CREATININE 0.73  CALCIUM 9.0   GFR: Estimated Creatinine Clearance: 62.5 mL/min (by C-G formula based on SCr of 0.73 mg/dL). Liver Function Tests: No results for input(s): AST, ALT, ALKPHOS, BILITOT, PROT, ALBUMIN in the last 168 hours. No results for input(s): LIPASE, AMYLASE in the last 168 hours. No results for input(s): AMMONIA in the last 168 hours. Coagulation Profile: No results for input(s): INR, PROTIME in the last 168 hours. Cardiac Enzymes: No results for input(s): CKTOTAL, CKMB, CKMBINDEX, TROPONINI in the last 168 hours. BNP (last 3 results) No results for input(s): PROBNP in the last 8760 hours. HbA1C: No results for  input(s): HGBA1C in the last 72 hours. CBG: No results for input(s): GLUCAP in the last 168 hours. Lipid Profile: No results for input(s): CHOL, HDL, LDLCALC, TRIG, CHOLHDL, LDLDIRECT in the last 72 hours. Thyroid Function Tests: No results for input(s): TSH, T4TOTAL, FREET4, T3FREE, THYROIDAB in the last 72 hours. Anemia Panel: No results for input(s): VITAMINB12, FOLATE, FERRITIN, TIBC, IRON, RETICCTPCT in the last 72 hours. Urine analysis:    Component Value Date/Time   COLORURINE YELLOW (A) 03/19/2020 0918   APPEARANCEUR CLEAR (A) 03/19/2020 0918   APPEARANCEUR Clear 03/05/2014 1731   LABSPEC 1.015 03/19/2020 0918   LABSPEC 1.009 03/05/2014 1731   PHURINE 5.0 03/19/2020 0918   GLUCOSEU NEGATIVE 03/19/2020 0918   GLUCOSEU Negative 03/05/2014 1731   HGBUR NEGATIVE 03/19/2020 0918   BILIRUBINUR NEGATIVE 03/19/2020 0918   BILIRUBINUR Negative 03/05/2014 1731   KETONESUR NEGATIVE 03/19/2020 0918   PROTEINUR NEGATIVE 03/19/2020 0918   NITRITE NEGATIVE 03/19/2020 0918   LEUKOCYTESUR NEGATIVE 03/19/2020 0918   LEUKOCYTESUR Negative 03/05/2014 1731    Radiological Exams on Admission: DG Chest 2 View  Result Date: 07/29/2020 CLINICAL DATA:  Chest pain EXAM: CHEST - 2 VIEW COMPARISON:  Mar 19, 2020 chest radiograph; April 29, 2019 chest CT. FINDINGS: There is a sizable hiatal type hernia. Opacity on the left may reside within the hiatal hernia. A degree of overlying opacity in the left lower lobe cannot be excluded. Lungs elsewhere clear. Heart size and pulmonary vascular normal. No adenopathy. There is lower thoracic and lumbar levoscoliosis. There is postoperative change in the lower cervical spine and left shoulder regions. There is arthropathy in the right shoulder. IMPRESSION: Large hiatal hernia. Question opacity within the hernia on the left versus left lower lobe infiltrate. Advise noncontrast chest CT to further evaluate the left lower lobe region given this circumstance. Lungs  elsewhere clear. Heart size normal. No adenopathy evident. Electronically Signed   By: Lowella Grip III M.D.   On: 07/29/2020 12:54   CT Angio Chest PE W/Cm &/Or Wo Cm  Result Date: 07/30/2020 CLINICAL  DATA:  Chest pain. EXAM: CT ANGIOGRAPHY CHEST WITH CONTRAST TECHNIQUE: Multidetector CT imaging of the chest was performed using the standard protocol during bolus administration of intravenous contrast. Multiplanar CT image reconstructions and MIPs were obtained to evaluate the vascular anatomy. CONTRAST:  162mL OMNIPAQUE IOHEXOL 350 MG/ML SOLN COMPARISON:  April 29, 2019 FINDINGS: Cardiovascular: There is moderate severity calcification of the aortic arch. Satisfactory opacification of the pulmonary arteries to the segmental level. No evidence of pulmonary embolism. Normal heart size. No pericardial effusion. Mediastinum/Nodes: Lungs/Pleura: A 1.4 cm x 0.9 cm x 0.5 cm focal noncalcified area of low attenuation is seen within the posterior aspect of the left upper lobe. This is increased in size when compared to the prior study (seen as an area of focal inflammatory changes on axial CT image 35, CT series number 3 of the prior exam). Mild to moderate severity atelectasis is seen within the posteromedial aspect of the left lung base. This area is seen adjacent to the posterior and lateral aspects of a large gastric hernia and is mildly increased in severity when compared to the prior study. There is no evidence of a pleural effusion or pneumothorax. Upper Abdomen: There is a large gastric hernia. Multiple surgical clips are seen within the gallbladder fossa. Musculoskeletal: A nondisplaced fracture deformity of the anterior third left rib is seen. While this is of indeterminate age. This represents a new finding when compared to the prior study. A metallic density fusion plate and screws are seen along the anterior aspect of the lower cervical spine. An intact left shoulder replacement is seen. Associated  streak artifact is noted with subsequently limited evaluation of the adjacent osseous and soft tissue structures. A chronic compression fracture deformity of the T12 vertebral body is seen. Multilevel degenerative changes seen throughout the thoracic spine. Review of the MIP images confirms the above findings. IMPRESSION: 1. No evidence of pulmonary embolism. 2. Area of low attenuation within the posterior aspect of the left upper lobe, which is increased in size when compared to the prior study dated April 29, 2019 and is concerning for the presence of an underlying neoplastic process. Correlation with PET-CT is recommended. 3. Large gastric hernia. 4. Nondisplaced fracture deformity of the anterior third left rib of indeterminate age. This represents a new finding when compared to the prior study. Correlation with physical examination is recommended to determine the presence or absence of point tenderness. 5. Chronic compression fracture deformity of the T12 vertebral body. 6. Intact left shoulder replacement. 7. Aortic atherosclerosis. Aortic Atherosclerosis (ICD10-I70.0). Electronically Signed   By: Virgina Norfolk M.D.   On: 07/30/2020 00:29     Assessment/Plan 74 year old female with history of COPD, HTN, hypothyroidism, GERD, DM, DDD presenting with left-sided sharp chest pain, associated with worsening in her chronic cough and shortness of breath.      Acute exacerbation of chronic obstructive pulmonary disease (COPD) Hypoxia ([place]) -Patient with O2 sats in the 90s on room air, being supplemented with O2 at 3 L -CTA chest negative for acute PE -Scheduled and as needed nebulizer therapy -Steroids electrolyte -supplemental oxygen  Atypical chest pain  Fracture of one rib, left side, initial encounter for closed fracture, History of recent fall -CTA chest showing anterior left rib fracture -Troponin was negative x2 at 3/4 -Pain control -Incentive spirometry    HTN (hypertension) -Continue  home metoprolol    Type 2 diabetes mellitus without complication (HCC) -Sliding scale insulin coverage  Hypothyroidism -Continue home levothyroxine    Obesity,  Class III, BMI 40-49.9 (morbid obesity) ([place]) -Complicating factor to overall prognosis and care    DVT prophylaxis: Lovenox  Code Status: full code  Family Communication:  none  Disposition Plan: Back to previous home environment Consults called: none  Status: Observation    Athena Masse MD [facility]     07/30/2020, 1:20 AM

## 2020-07-30 NOTE — Progress Notes (Signed)
She complains of a dry cough.  Breathing is a little better.  She is tachycardic otherwise vital signs are stable.  Continue current treatment.  Monitor overnight.

## 2020-07-30 NOTE — ED Notes (Signed)
Pt assisted up to toilet. Assisted with peri care and to change underwear. Assisted back into bed. Bed locked low. Rail up. Call bell within reach. Placed back on monitor and given [place] for O2 again.

## 2020-07-30 NOTE — ED Notes (Signed)
RN went into pts room. Pts oxygen was 86%, as pt had removed her oxygen. Oxygen replaced on pt at 2lpm via [employer] and oxygen is now 91%

## 2020-07-30 NOTE — ED Notes (Signed)
Pt states she did fall last Saturday.

## 2020-07-30 NOTE — ED Notes (Signed)
Daughter Florentina Jenny [phone]

## 2020-07-30 NOTE — ED Notes (Signed)
RN informed pt that we needed a walking oxygen saturation. Pt declined to do it at this time

## 2020-07-30 NOTE — Evaluation (Signed)
Physical Therapy Evaluation Patient Details Name: [name] MRN: [id number] DOB: 1946-04-10 Today's Date: 07/30/2020   History of Present Illness   Pt is a 74 y.o. female with medical history significant for COPD, HTN, hypothyroidism, GERD, DM, DDD who presents to the ER, with left-sided sharp chest pain, associated with worsening in her chronic cough and shortness of breath. CT showed  show a nondisplaced fracture of the anterior third left rib of indeterminate age not present on prior study as well as a chronic compression fracture at T12. Workup showed acute exacerbation of COPD and atypical chest pain.     Clinical Impression  Pt alert, agreeable to PT, reported that she feels better since admission. Pt stated she lives in a one story home with a live in caregiver, has assistance close to 24/7. Has had multiple falls.  The patient demonstrated supine <> sit with min (pt normally sleeps in a recliner). Pt with sit <> stand from elevated bed and use of rollator and supervision. She ambulated ~37ft with rollator and supervision. Pt on room air throughout session, spO2 >90% but moderate SOB noted.  Overall the patient demonstrated deficits (see "PT Problem List") that impede the patient's functional abilities, safety, and mobility and would benefit from skilled PT intervention. Recommendation for HHPT with intermittent supervision including for mobility/OOB to maximize safety, endurance, activity tolerance.      Follow Up Recommendations Home health PT;Supervision - Intermittent;Supervision for mobility/OOB    Equipment Recommendations  None recommended by PT    Recommendations for Other Services       Precautions / Restrictions Precautions Precautions: Fall Precaution Comments: L rib fx  Restrictions Weight Bearing Restrictions: No      Mobility  Bed Mobility Overal bed mobility: Needs Assistance Bed Mobility: Supine to Sit;Sit to Supine     Supine to sit: Min assist Sit  to supine: Min guard   General bed mobility comments: High stretcher height   Transfers Overall transfer level: Needs assistance Equipment used: 1 person hand held assist;4-wheeled walker Transfers: Sit to/from Stand Sit to Stand: From elevated surface;Min guard         General transfer comment: pt able to safely use rollator at bedside  Ambulation/Gait Ambulation/Gait assistance: Min guard Gait Distance (Feet): 25 Feet Assistive device: 4-wheeled walker       General Gait Details: decreased velocity, SOB noted spO2 WFLs throughout  Stairs            Wheelchair Mobility    Modified Rankin (Stroke Patients Only)       Balance Overall balance assessment: Needs assistance Sitting-balance support: Feet unsupported;Single extremity supported Sitting balance-Leahy Scale: Fair     Standing balance support: Bilateral upper extremity supported;During functional activity Standing balance-Leahy Scale: Fair Standing balance comment: pt able to stand staticly without support                             Pertinent Vitals/Pain Pain Assessment: Faces Faces Pain Scale: Hurts a little bit Pain Location: L rib Pain Descriptors / Indicators: Discomfort;Dull Pain Intervention(s): Limited activity within patient's tolerance;Monitored during session;Repositioned    Home Living Family/patient expects to be discharged to:: Private residence Living Arrangements: Other (Comment) (live in caregiver ) Available Help at Discharge: Personal care attendant;Available 24 hours/day Type of Home: Mobile home Home Access: Ramped entrance     Home Layout: One level Home Equipment: [place] - 4 wheels;Toilet riser;Tub bench;Grab bars - toilet  Prior Function Level of Independence: Needs assistance   Gait / Transfers Assistance Needed: MOD I household mobility c rolator   ADL's / Homemaking Assistance Needed: live in caregiver for IADLs and MIN A bathing/LBD         Hand Dominance   Dominant Hand: Right    Extremity/Trunk Assessment   Upper Extremity Assessment Upper Extremity Assessment: Defer to OT evaluation    Lower Extremity Assessment Lower Extremity Assessment: Generalized weakness       Communication   Communication: No difficulties  Cognition Arousal/Alertness: Awake/alert Behavior During Therapy: WFL for tasks assessed/performed Overall Cognitive Status: Within Functional Limits for tasks assessed                                        General Comments General comments (skin integrity, edema, etc.): RA throughout, spO2 >90% throughout mobility    Exercises     Assessment/Plan    PT Assessment Patient needs continued PT services  PT Problem List Decreased strength;Decreased activity tolerance;Decreased balance;Decreased knowledge of precautions       PT Treatment Interventions DME instruction;Therapeutic exercise;Balance training;Gait training;Stair training;Neuromuscular re-education;Functional mobility training;Therapeutic activities;Patient/family education    PT Goals (Current goals can be found in the Care Plan section)  Acute Rehab PT Goals Patient Stated Goal: to go home PT Goal Formulation: With patient Time For Goal Achievement: 08/13/20 Potential to Achieve Goals: Good    Frequency Min 2X/week   Barriers to discharge        Co-evaluation               AM-PAC PT "6 Clicks" Mobility  Outcome Measure Help needed turning from your back to your side while in a flat bed without using bedrails?: A Little Help needed moving from lying on your back to sitting on the side of a flat bed without using bedrails?: A Little Help needed moving to and from a bed to a chair (including a wheelchair)?: A Little Help needed standing up from a chair using your arms (e.g., wheelchair or bedside chair)?: A Little Help needed to walk in hospital room?: A Little Help needed climbing 3-5 steps with  a railing? : A Lot 6 Click Score: 17    End of Session Equipment Utilized During Treatment: Gait belt Activity Tolerance: Patient tolerated treatment well Patient left: in bed;with call bell/phone within reach Nurse Communication: Mobility status PT Visit Diagnosis: Other abnormalities of gait and mobility (R26.89);Muscle weakness (generalized) (M62.81);History of falling (Z91.81);Repeated falls (R29.6)    Time: 8756-4332 PT Time Calculation (min) (ACUTE ONLY): 26 min   Charges:   PT Evaluation $PT Eval Low Complexity: 1 Low PT Treatments $Therapeutic Exercise: 8-22 mins       Lieutenant Diego PT, DPT 2:29 PM,07/30/20

## 2020-07-31 LAB — GLUCOSE, CAPILLARY
Glucose-Capillary: 136 mg/dL — ABNORMAL HIGH (ref 70–99)
Glucose-Capillary: 135 mg/dL — ABNORMAL HIGH (ref 70–99)

## 2020-07-31 MED ORDER — IPRATROPIUM-ALBUTEROL 0.5-2.5 (3) MG/3ML IN SOLN: 3.0000 mL | Freq: Four times a day (QID) | RESPIRATORY_TRACT | Status: DC

## 2020-07-31 MED ORDER — PREDNISONE 20 MG PO TABS: 40.0000 mg | ORAL_TABLET | Freq: Every day | ORAL | 0 refills | Status: AC

## 2020-07-31 MED ORDER — ENSURE ENLIVE PO LIQD: 237.0000 mL | ORAL | Status: DC

## 2020-07-31 NOTE — Progress Notes (Signed)
Initial Nutrition Assessment  RD working remotely.  DOCUMENTATION CODES:   Morbid obesity  INTERVENTION:  - will order Ensure Enlive once/day, each supplement provides 350 kcal and 20 grams of protein.  NUTRITION DIAGNOSIS:   Increased nutrient needs related to acute illness as evidenced by estimated needs.  GOAL:   Patient will meet greater than or equal to 90% of their needs  MONITOR:   PO intake, Supplement acceptance, Labs, Weight trends  REASON FOR ASSESSMENT:   Consult Assessment of nutrition requirement/status  ASSESSMENT:   74 y.o. female with medical history of COPD, HTN, hypothyroidism, GERD, DM, and DDD. She presented to the ED with L-sided sharp chest pain and worsening of chronic cough and shortness of breath. She denied N/V PTA. She stated she had a fall a few days PTA. She denied any pain or BLE swelling.  No intakes documented since admission. Unable to reach patient by phone. Weight on 9/30 was 208 lb and PTA the most recently documented weight was on 03/19/20 when she weighed 199 lb; this indicates weight gain.   Per notes: - COPD exacerbation - atypical chest pain with one L-sided rib fractured during recent fall   Labs reviewed; CBG: 136 mg/dl. Medications reviewed; sliding scale novolog, 40 mg solu-medrol x4 doses 10/1, 40 mg deltasone/day.     NUTRITION - FOCUSED PHYSICAL EXAM:  unable to complete at this time.   Diet Order:   Diet Order            Diet heart healthy/carb modified Room service appropriate? Yes; Fluid consistency: Thin  Diet effective now                 EDUCATION NEEDS:   No education needs have been identified at this time  Skin:  Skin Assessment: Reviewed RN Assessment  Last BM:  PTA/unknown  Height:   Ht Readings from Last 1 Encounters:  07/29/20 4\' 10"  (1.473 m)    Weight:   Wt Readings from Last 1 Encounters:  07/29/20 94.3 kg     Estimated Nutritional Needs:  Kcal:  1550-1750 kcal Protein:   80-90 grams Fluid:  >/= 1.6 L/day     Jarome Matin, MS, RD, LDN, CNSC Inpatient Clinical Dietitian RD pager # available in [facility]  After hours/weekend pager # available in [facility]

## 2020-07-31 NOTE — Plan of Care (Signed)
Continuing with plan of care. 

## 2020-07-31 NOTE — Discharge Summary (Signed)
Physician Discharge Summary  INARI SHIN TKP:[id number] DOB: Feb 19, 1946 DOA: 07/29/2020  PCP: Baxter Hire, MD  Admit date: 07/29/2020 Discharge date: 07/31/2020  Discharge disposition: Home   Recommendations for Outpatient Follow-Up:   Follow up with PCP in 1 week  Discharge Diagnosis:   Principal Problem:   Acute exacerbation of chronic obstructive pulmonary disease (COPD) ([place]) Active Problems:   HTN (hypertension)   Type 2 diabetes mellitus without complication (HCC)   Obesity, Class III, BMI 40-49.9 (morbid obesity) ([place])   History of recent fall   Fracture of one rib, left side, initial encounter for closed fracture   Hypoxia   Chest pain, atypical   COPD exacerbation ([place])    Discharge Condition: Stable.  Diet recommendation:  Diet Order            Diet - low sodium heart healthy           Diet heart healthy/carb modified Room service appropriate? Yes; Fluid consistency: Thin  Diet effective now                   Code Status: Full Code     Hospital Course:   Ms. [name] is a 74 y.o. female with medical history significant for COPD, HTN, hypothyroidism, GERD, DM, DDD , multiple falls at home, who presented to the hospital with left-sided chest pain, shortness of breath and acute worsening of chronic cough.  She said she had fallen at home a few days prior to admission.  She has been using her home nebulizer treatment without any improvement.  She was admitted to the hospital for COPD exacerbation.  She was placed on oxygen for comfort but there was no true oxygen desaturation.  She was treated with steroids and bronchodilators.  She was also found to have nondisplaced fracture deformity of the anterior third left rib but the acuity of the fracture was unknown.  Her symptoms have improved and she was able to ambulate without any oxygen desaturation or worsening symptoms.  She is deemed stable for discharge to home.  Discharge plan was discussed  with the patient and her daughter at the bedside.      Discharge Exam:    Vitals:   07/31/20 0751 07/31/20 1203 07/31/20 1249 07/31/20 1330  BP: 122/77 (!) 146/123 137/85   Pulse: 97 (!) 108 (!) 107   Resp: 18 16 20    Temp: 98.6 F (37 C) 98.8 F (37.1 C) 98.8 F (37.1 C)   TempSrc: Oral Oral Oral   SpO2: 95% 94% 95% 96%  Weight:      Height:         GEN: NAD SKIN: No rash EYES: EOMI ENT: MMM CV: RRR PULM: CTA B ABD: soft, obese, NT, +BS CNS: AAO x 3, non focal EXT: No edema or tenderness MSK: Left upper anterior chest wall tenderness   The results of significant diagnostics from this hospitalization (including imaging, microbiology, ancillary and laboratory) are listed below for reference.     Procedures and Diagnostic Studies:   DG Chest 2 View  Result Date: 07/29/2020 CLINICAL DATA:  Chest pain EXAM: CHEST - 2 VIEW COMPARISON:  Mar 19, 2020 chest radiograph; April 29, 2019 chest CT. FINDINGS: There is a sizable hiatal type hernia. Opacity on the left may reside within the hiatal hernia. A degree of overlying opacity in the left lower lobe cannot be excluded. Lungs elsewhere clear. Heart size and pulmonary vascular normal. No adenopathy. There is lower  thoracic and lumbar levoscoliosis. There is postoperative change in the lower cervical spine and left shoulder regions. There is arthropathy in the right shoulder. IMPRESSION: Large hiatal hernia. Question opacity within the hernia on the left versus left lower lobe infiltrate. Advise noncontrast chest CT to further evaluate the left lower lobe region given this circumstance. Lungs elsewhere clear. Heart size normal. No adenopathy evident. Electronically Signed   By: Lowella Grip III M.D.   On: 07/29/2020 12:54   CT Angio Chest PE W/Cm &/Or Wo Cm  Result Date: 07/30/2020 CLINICAL DATA:  Chest pain. EXAM: CT ANGIOGRAPHY CHEST WITH CONTRAST TECHNIQUE: Multidetector CT imaging of the chest was performed using the  standard protocol during bolus administration of intravenous contrast. Multiplanar CT image reconstructions and MIPs were obtained to evaluate the vascular anatomy. CONTRAST:  139mL OMNIPAQUE IOHEXOL 350 MG/ML SOLN COMPARISON:  April 29, 2019 FINDINGS: Cardiovascular: There is moderate severity calcification of the aortic arch. Satisfactory opacification of the pulmonary arteries to the segmental level. No evidence of pulmonary embolism. Normal heart size. No pericardial effusion. Mediastinum/Nodes: Lungs/Pleura: A 1.4 cm x 0.9 cm x 0.5 cm focal noncalcified area of low attenuation is seen within the posterior aspect of the left upper lobe. This is increased in size when compared to the prior study (seen as an area of focal inflammatory changes on axial CT image 35, CT series number 3 of the prior exam). Mild to moderate severity atelectasis is seen within the posteromedial aspect of the left lung base. This area is seen adjacent to the posterior and lateral aspects of a large gastric hernia and is mildly increased in severity when compared to the prior study. There is no evidence of a pleural effusion or pneumothorax. Upper Abdomen: There is a large gastric hernia. Multiple surgical clips are seen within the gallbladder fossa. Musculoskeletal: A nondisplaced fracture deformity of the anterior third left rib is seen. While this is of indeterminate age. This represents a new finding when compared to the prior study. A metallic density fusion plate and screws are seen along the anterior aspect of the lower cervical spine. An intact left shoulder replacement is seen. Associated streak artifact is noted with subsequently limited evaluation of the adjacent osseous and soft tissue structures. A chronic compression fracture deformity of the T12 vertebral body is seen. Multilevel degenerative changes seen throughout the thoracic spine. Review of the MIP images confirms the above findings. IMPRESSION: 1. No evidence of  pulmonary embolism. 2. Area of low attenuation within the posterior aspect of the left upper lobe, which is increased in size when compared to the prior study dated April 29, 2019 and is concerning for the presence of an underlying neoplastic process. Correlation with PET-CT is recommended. 3. Large gastric hernia. 4. Nondisplaced fracture deformity of the anterior third left rib of indeterminate age. This represents a new finding when compared to the prior study. Correlation with physical examination is recommended to determine the presence or absence of point tenderness. 5. Chronic compression fracture deformity of the T12 vertebral body. 6. Intact left shoulder replacement. 7. Aortic atherosclerosis. Aortic Atherosclerosis (ICD10-I70.0). Electronically Signed   By: Virgina Norfolk M.D.   On: 07/30/2020 00:29     Labs:   Basic Metabolic Panel: Recent Labs  Lab 07/29/20 1218 07/30/20 0140  NA 134*  --   K 4.0  --   CL 94*  --   CO2 28  --   GLUCOSE 110*  --   BUN 11  --  CREATININE 0.73 0.71  CALCIUM 9.0  --    GFR Estimated Creatinine Clearance: 62.5 mL/min (by C-G formula based on SCr of 0.71 mg/dL). Liver Function Tests: No results for input(s): AST, ALT, ALKPHOS, BILITOT, PROT, ALBUMIN in the last 168 hours. No results for input(s): LIPASE, AMYLASE in the last 168 hours. No results for input(s): AMMONIA in the last 168 hours. Coagulation profile No results for input(s): INR, PROTIME in the last 168 hours.  CBC: Recent Labs  Lab 07/29/20 1218 07/30/20 0140  WBC 9.1 13.0*  HGB 12.8 11.9*  HCT 40.2 38.9  MCV 82.9 84.7  PLT 331 286   Cardiac Enzymes: No results for input(s): CKTOTAL, CKMB, CKMBINDEX, TROPONINI in the last 168 hours. BNP: Invalid input(s): POCBNP CBG: Recent Labs  Lab 07/30/20 1438 07/30/20 2104 07/30/20 2354 07/31/20 0753 07/31/20 1130  GLUCAP 159* 166* 139* 136* 135*   D-Dimer No results for input(s): DDIMER in the last 72 hours. Hgb  A1c Recent Labs    07/30/20 0140  HGBA1C 5.6   Lipid Profile No results for input(s): CHOL, HDL, LDLCALC, TRIG, CHOLHDL, LDLDIRECT in the last 72 hours. Thyroid function studies No results for input(s): TSH, T4TOTAL, T3FREE, THYROIDAB in the last 72 hours.  Invalid input(s): FREET3 Anemia work up No results for input(s): VITAMINB12, FOLATE, FERRITIN, TIBC, IRON, RETICCTPCT in the last 72 hours. Microbiology Recent Results (from the past 240 hour(s))  Respiratory Panel by RT PCR (Flu A&B, Covid) - Nasopharyngeal Swab     Status: None   Collection Time: 07/30/20 12:47 AM   Specimen: Nasopharyngeal Swab  Result Value Ref Range Status   SARS Coronavirus 2 by RT PCR NEGATIVE NEGATIVE Final    Comment: (NOTE) SARS-CoV-2 target nucleic acids are NOT DETECTED.  The SARS-CoV-2 RNA is generally detectable in upper respiratoy specimens during the acute phase of infection. The lowest concentration of SARS-CoV-2 viral copies this assay can detect is 131 copies/mL. A negative result does not preclude SARS-Cov-2 infection and should not be used as the sole basis for treatment or other patient management decisions. A negative result may occur with  improper specimen collection/handling, submission of specimen other than nasopharyngeal swab, presence of viral mutation(s) within the areas targeted by this assay, and inadequate number of viral copies (<131 copies/mL). A negative result must be combined with clinical observations, patient history, and epidemiological information. The expected result is Negative.  Fact Sheet for Patients:  PinkCheek.be  Fact Sheet for Healthcare Providers:  GravelBags.it  This test is no t yet approved or cleared by the [place] FDA and  has been authorized for detection and/or diagnosis of SARS-CoV-2 by FDA under an Emergency Use Authorization (EUA). This EUA will remain  in effect (meaning this  test can be used) for the duration of the COVID-19 declaration under Section 564(b)(1) of the Act, 21 U.S.C. section 360bbb-3(b)(1), unless the authorization is terminated or revoked sooner.     Influenza A by PCR NEGATIVE NEGATIVE Final   Influenza B by PCR NEGATIVE NEGATIVE Final    Comment: (NOTE) The Xpert Xpress SARS-CoV-2/FLU/RSV assay is intended as an aid in  the diagnosis of influenza from Nasopharyngeal swab specimens and  should not be used as a sole basis for treatment. Nasal washings and  aspirates are unacceptable for Xpert Xpress SARS-CoV-2/FLU/RSV  testing.  Fact Sheet for Patients: PinkCheek.be  Fact Sheet for Healthcare Providers: GravelBags.it  This test is not yet approved or cleared by the [place] FDA and  has been  authorized for detection and/or diagnosis of SARS-CoV-2 by  FDA under an Emergency Use Authorization (EUA). This EUA will remain  in effect (meaning this test can be used) for the duration of the  Covid-19 declaration under Section 564(b)(1) of the Act, 21  U.S.C. section 360bbb-3(b)(1), unless the authorization is  terminated or revoked. Performed at [facility], [address], [place], [place] [address]      Discharge Instructions:   Discharge Instructions    ([place]) Call MD:  Anytime you have any of the following symptoms: 1) 3 pound weight gain in 24 hours or 5 pounds in 1 week 2) shortness of breath, with or without a dry hacking cough 3) swelling in the hands, feet or stomach 4) if you have to sleep on extra pillows at night in order to breathe.   Complete by: As directed    Call MD for:  difficulty breathing, headache or visual disturbances   Complete by: As directed    Call MD for:  extreme fatigue   Complete by: As directed    Call MD for:  persistant dizziness or light-headedness   Complete by: As directed    Call MD for:  persistant nausea  and vomiting   Complete by: As directed    Call MD for:  severe uncontrolled pain   Complete by: As directed    Call MD for:  temperature >100.4   Complete by: As directed    Diet - low sodium heart healthy   Complete by: As directed    Increase activity slowly   Complete by: As directed      Allergies as of 07/31/2020      Reactions   Adhesive [tape] Other (See Comments)   Other Rash   Plastic tape      Medication List    STOP taking these medications   traMADol 50 MG tablet Commonly known as: Ultram     TAKE these medications   albuterol 108 (90 Base) MCG/ACT inhaler Commonly known as: VENTOLIN HFA Inhale 1-2 puffs into the lungs every 6 (six) hours as needed for wheezing or shortness of breath.   amLODipine 5 MG tablet Commonly known as: NORVASC Take 5 mg by mouth daily.   citalopram 10 MG tablet Commonly known as: CELEXA Take 1 tablet (10 mg total) by mouth daily.   FeroSul 325 (65 FE) MG tablet Generic drug: ferrous sulfate Take 325 mg by mouth every morning.   Fluticasone-Salmeterol 250-50 MCG/DOSE Aepb Commonly known as: Advair Diskus Inhale 1 puff into the lungs 2 (two) times daily.   furosemide 20 MG tablet Commonly known as: LASIX Take 1 tablet (20 mg total) by mouth daily.   gabapentin 100 MG capsule Commonly known as: NEURONTIN Take 2 capsules (200 mg total) by mouth at bedtime.   levothyroxine 112 MCG tablet Commonly known as: SYNTHROID Take 112 mcg by mouth daily before breakfast.   meloxicam 15 MG tablet Commonly known as: MOBIC Take 15 mg by mouth daily.   metoprolol tartrate 100 MG tablet Commonly known as: LOPRESSOR Take 100 mg by mouth 2 (two) times daily.   multivitamin tablet Take 1 tablet by mouth daily. Reported on 04/05/2016   nystatin powder Generic drug: nystatin Apply 1 application topically 2 (two) times daily as needed.   omeprazole 20 MG capsule Commonly known as: PRILOSEC Take 20 mg by mouth daily.   potassium  chloride SA 20 MEQ tablet Commonly known as: KLOR-CON Take 20 mEq by  mouth daily.   predniSONE 20 MG tablet Commonly known as: DELTASONE Take 2 tablets (40 mg total) by mouth daily with breakfast for 2 days. Start taking on: August 01, 2020   senna-docusate 8.6-50 MG tablet Commonly known as: Senokot-S Take 1 tablet by mouth daily.   tiotropium 18 MCG inhalation capsule Commonly known as: Spiriva HandiHaler Place 1 capsule (18 mcg total) into inhaler and inhale daily.   Vitamin D 50 MCG (2000 UT) tablet Take 2,000 Units by mouth daily.         Time coordinating discharge: 28 mimutes  Signed:  Izamar Linden  [facility] 07/31/2020, 3:05 PM   Pager on [email or web address]. If 7PM-7AM, please contact night-coverage at [email or web address]

## 2020-07-31 NOTE — Plan of Care (Signed)
Discharge teaching completed with patient who is in stable condition. 

## 2021-01-20 ENCOUNTER — Other Ambulatory Visit: Payer: Self-pay

## 2021-01-20 ENCOUNTER — Encounter: Payer: Self-pay | Admitting: Emergency Medicine

## 2021-01-20 ENCOUNTER — Inpatient Hospital Stay
Admission: EM | Admit: 2021-01-20 | Discharge: 2021-01-25 | DRG: 377 | Disposition: A | Discharge status: Skilled Nursing Facility | Payer: Medicare Other | Attending: Internal Medicine | Admitting: Internal Medicine

## 2021-01-20 DIAGNOSIS — Z833 Family history of diabetes mellitus: Secondary | ICD-10-CM

## 2021-01-20 DIAGNOSIS — I10 Essential (primary) hypertension: Secondary | ICD-10-CM | POA: Diagnosis present

## 2021-01-20 DIAGNOSIS — I959 Hypotension, unspecified: Secondary | ICD-10-CM | POA: Diagnosis present

## 2021-01-20 DIAGNOSIS — K5731 Diverticulosis of large intestine without perforation or abscess with bleeding: Secondary | ICD-10-CM | POA: Diagnosis not present

## 2021-01-20 DIAGNOSIS — D500 Iron deficiency anemia secondary to blood loss (chronic): Secondary | ICD-10-CM

## 2021-01-20 DIAGNOSIS — K922 Gastrointestinal hemorrhage, unspecified: Secondary | ICD-10-CM

## 2021-01-20 DIAGNOSIS — D649 Anemia, unspecified: Secondary | ICD-10-CM

## 2021-01-20 DIAGNOSIS — E039 Hypothyroidism, unspecified: Secondary | ICD-10-CM | POA: Diagnosis present

## 2021-01-20 DIAGNOSIS — Z79890 Hormone replacement therapy (postmenopausal): Secondary | ICD-10-CM

## 2021-01-20 DIAGNOSIS — R2681 Unsteadiness on feet: Secondary | ICD-10-CM | POA: Diagnosis present

## 2021-01-20 DIAGNOSIS — J9601 Acute respiratory failure with hypoxia: Secondary | ICD-10-CM | POA: Diagnosis present

## 2021-01-20 DIAGNOSIS — D529 Folate deficiency anemia, unspecified: Secondary | ICD-10-CM

## 2021-01-20 DIAGNOSIS — K449 Diaphragmatic hernia without obstruction or gangrene: Secondary | ICD-10-CM

## 2021-01-20 DIAGNOSIS — Z20822 Contact with and (suspected) exposure to covid-19: Secondary | ICD-10-CM | POA: Diagnosis present

## 2021-01-20 DIAGNOSIS — Z888 Allergy status to other drugs, medicaments and biological substances status: Secondary | ICD-10-CM

## 2021-01-20 DIAGNOSIS — Z79899 Other long term (current) drug therapy: Secondary | ICD-10-CM

## 2021-01-20 DIAGNOSIS — Z841 Family history of disorders of kidney and ureter: Secondary | ICD-10-CM

## 2021-01-20 DIAGNOSIS — J9811 Atelectasis: Secondary | ICD-10-CM | POA: Diagnosis present

## 2021-01-20 DIAGNOSIS — K219 Gastro-esophageal reflux disease without esophagitis: Secondary | ICD-10-CM | POA: Diagnosis present

## 2021-01-20 DIAGNOSIS — Z6841 Body Mass Index (BMI) 40.0 and over, adult: Secondary | ICD-10-CM

## 2021-01-20 DIAGNOSIS — G2581 Restless legs syndrome: Secondary | ICD-10-CM | POA: Diagnosis present

## 2021-01-20 DIAGNOSIS — D509 Iron deficiency anemia, unspecified: Secondary | ICD-10-CM | POA: Diagnosis not present

## 2021-01-20 DIAGNOSIS — Z791 Long term (current) use of non-steroidal anti-inflammatories (NSAID): Secondary | ICD-10-CM

## 2021-01-20 DIAGNOSIS — J449 Chronic obstructive pulmonary disease, unspecified: Secondary | ICD-10-CM | POA: Diagnosis present

## 2021-01-20 DIAGNOSIS — E119 Type 2 diabetes mellitus without complications: Secondary | ICD-10-CM | POA: Diagnosis present

## 2021-01-20 DIAGNOSIS — Z8249 Family history of ischemic heart disease and other diseases of the circulatory system: Secondary | ICD-10-CM

## 2021-01-20 DIAGNOSIS — K648 Other hemorrhoids: Secondary | ICD-10-CM | POA: Diagnosis present

## 2021-01-20 DIAGNOSIS — R630 Anorexia: Secondary | ICD-10-CM | POA: Diagnosis present

## 2021-01-20 DIAGNOSIS — R911 Solitary pulmonary nodule: Secondary | ICD-10-CM | POA: Diagnosis present

## 2021-01-20 DIAGNOSIS — F32A Depression, unspecified: Secondary | ICD-10-CM | POA: Diagnosis present

## 2021-01-20 DIAGNOSIS — E538 Deficiency of other specified B group vitamins: Secondary | ICD-10-CM | POA: Diagnosis present

## 2021-01-20 DIAGNOSIS — Z87891 Personal history of nicotine dependence: Secondary | ICD-10-CM

## 2021-01-20 LAB — COMPREHENSIVE METABOLIC PANEL
ALT: 10 U/L (ref 0–44)
AST: 16 U/L (ref 15–41)
Albumin: 3.1 g/dL — ABNORMAL LOW (ref 3.5–5.0)
Alkaline Phosphatase: 71 U/L (ref 38–126)
Anion gap: 9 (ref 5–15)
BUN: 12 mg/dL (ref 8–23)
CO2: 29 mmol/L (ref 22–32)
Calcium: 8.9 mg/dL (ref 8.9–10.3)
Chloride: 99 mmol/L (ref 98–111)
Creatinine, Ser: 0.8 mg/dL (ref 0.44–1.00)
GFR, Estimated: >60 mL/min (ref >60)
Glucose, Bld: 115 mg/dL — ABNORMAL HIGH (ref 70–99)
Potassium: 4.1 mmol/L (ref 3.5–5.1)
Sodium: 137 mmol/L (ref 135–145)
Total Bilirubin: 0.5 mg/dL (ref 0.3–1.2)
Total Protein: 6.4 g/dL — ABNORMAL LOW (ref 6.5–8.1)

## 2021-01-20 LAB — RETICULOCYTES
Immature Retic Fract: 5.8 % (ref 2.3–15.9)
RBC.: 2.7 MIL/uL — ABNORMAL LOW (ref 3.87–5.11)
Retic Count, Absolute: 56.4 10*3/uL (ref 19.0–186.0)
Retic Ct Pct: 2.1 % (ref 0.4–3.1)

## 2021-01-20 LAB — RESP PANEL BY RT-PCR (FLU A&B, COVID) ARPGX2
Influenza A by PCR: NEGATIVE
Influenza B by PCR: NEGATIVE
SARS Coronavirus 2 by RT PCR: NEGATIVE

## 2021-01-20 LAB — IRON AND TIBC
Iron: 12 ug/dL — ABNORMAL LOW (ref 28–170)
Saturation Ratios: 2 % — ABNORMAL LOW (ref 10.4–31.8)
TIBC: 500 ug/dL — ABNORMAL HIGH (ref 250–450)
UIBC: 488 ug/dL

## 2021-01-20 LAB — CBC WITH DIFFERENTIAL/PLATELET
Abs Immature Granulocytes: 0.04 10*3/uL (ref 0.00–0.07)
Basophils Absolute: 0.1 10*3/uL (ref 0.0–0.1)
Basophils Relative: 1 %
Eosinophils Absolute: 0.1 10*3/uL (ref 0.0–0.5)
Eosinophils Relative: 2 %
HCT: 22.7 % — ABNORMAL LOW (ref 36.0–46.0)
Hemoglobin: 6.5 g/dL — ABNORMAL LOW (ref 12.0–15.0)
Immature Granulocytes: 1 %
Lymphocytes Relative: 16 %
Lymphs Abs: 1.1 10*3/uL (ref 0.7–4.0)
MCH: 23.6 pg — ABNORMAL LOW (ref 26.0–34.0)
MCHC: 28.6 g/dL — ABNORMAL LOW (ref 30.0–36.0)
MCV: 82.5 fL (ref 80.0–100.0)
Monocytes Absolute: 0.6 10*3/uL (ref 0.1–1.0)
Monocytes Relative: 9 %
Neutro Abs: 4.8 10*3/uL (ref 1.7–7.7)
Neutrophils Relative %: 71 %
Platelets: 322 10*3/uL (ref 150–400)
RBC: 2.75 MIL/uL — ABNORMAL LOW (ref 3.87–5.11)
RDW: 17.9 % — ABNORMAL HIGH (ref 11.5–15.5)
WBC: 6.8 10*3/uL (ref 4.0–10.5)
nRBC: 0.7 % — ABNORMAL HIGH (ref 0.0–0.2)

## 2021-01-20 LAB — CBC
HCT: 21.5 % — ABNORMAL LOW (ref 36.0–46.0)
Hemoglobin: 6.3 g/dL — ABNORMAL LOW (ref 12.0–15.0)
MCH: 24.1 pg — ABNORMAL LOW (ref 26.0–34.0)
MCHC: 29.3 g/dL — ABNORMAL LOW (ref 30.0–36.0)
MCV: 82.4 fL (ref 80.0–100.0)
Platelets: 306 10*3/uL (ref 150–400)
RBC: 2.61 MIL/uL — ABNORMAL LOW (ref 3.87–5.11)
RDW: 17.8 % — ABNORMAL HIGH (ref 11.5–15.5)
WBC: 6.2 10*3/uL (ref 4.0–10.5)
nRBC: 0.6 % — ABNORMAL HIGH (ref 0.0–0.2)

## 2021-01-20 LAB — FERRITIN: Ferritin: 4 ng/mL — ABNORMAL LOW (ref 11–307)

## 2021-01-20 LAB — FOLATE: Folate: 5.3 ng/mL — ABNORMAL LOW (ref >5.9)

## 2021-01-20 LAB — PROTIME-INR
INR: 1.1 (ref 0.8–1.2)
Prothrombin Time: 13.5 seconds (ref 11.4–15.2)

## 2021-01-20 LAB — PREPARE RBC (CROSSMATCH)

## 2021-01-20 LAB — VITAMIN B12: Vitamin B-12: 249 pg/mL (ref 180–914)

## 2021-01-20 LAB — BRAIN NATRIURETIC PEPTIDE: B Natriuretic Peptide: 79.7 pg/mL (ref 0.0–100.0)

## 2021-01-20 LAB — LACTATE DEHYDROGENASE: LDH: 137 U/L (ref 98–192)

## 2021-01-20 LAB — APTT: aPTT: 28 seconds (ref 24–36)

## 2021-01-20 MED ORDER — CITALOPRAM HYDROBROMIDE 20 MG PO TABS
40.0000 mg | ORAL_TABLET | Freq: Every day | ORAL | Status: DC
  Administered 2021-01-21 – 2021-01-25 (×5): 40 mg via ORAL
  Filled 2021-01-20 (×5): qty 2

## 2021-01-20 MED ORDER — GABAPENTIN 100 MG PO CAPS
200.0000 mg | ORAL_CAPSULE | Freq: Every day | ORAL | Status: DC
  Administered 2021-01-20 – 2021-01-24 (×5): 200 mg via ORAL
  Filled 2021-01-20 (×5): qty 2

## 2021-01-20 MED ORDER — TIOTROPIUM BROMIDE MONOHYDRATE 18 MCG IN CAPS
18.0000 ug | ORAL_CAPSULE | Freq: Every day | RESPIRATORY_TRACT | Status: DC
  Administered 2021-01-20 – 2021-01-25 (×6): 18 ug via RESPIRATORY_TRACT
  Filled 2021-01-20: qty 5

## 2021-01-20 MED ORDER — SENNOSIDES-DOCUSATE SODIUM 8.6-50 MG PO TABS
1.0000 | ORAL_TABLET | Freq: Every day | ORAL | Status: DC
  Administered 2021-01-21 – 2021-01-24 (×3): 1 via ORAL
  Filled 2021-01-20 (×5): qty 1

## 2021-01-20 MED ORDER — ALBUTEROL SULFATE HFA 108 (90 BASE) MCG/ACT IN AERS
2.0000 | INHALATION_SPRAY | RESPIRATORY_TRACT | Status: DC | PRN
  Administered 2021-01-21: 2 via RESPIRATORY_TRACT
  Filled 2021-01-20 (×2): qty 6.7

## 2021-01-20 MED ORDER — VITAMIN D3 25 MCG (1000 UNIT) PO TABS
2000.0000 [IU] | ORAL_TABLET | Freq: Every day | ORAL | Status: DC
  Administered 2021-01-21 – 2021-01-25 (×5): 2000 [IU] via ORAL
  Filled 2021-01-20 (×10): qty 2

## 2021-01-20 MED ORDER — SODIUM CHLORIDE 0.9% IV SOLUTION: Freq: Once | INTRAVENOUS | Status: AC

## 2021-01-20 MED ORDER — MAGNESIUM CITRATE PO SOLN
1.0000 | Freq: Once | ORAL | Status: AC
  Administered 2021-01-20: 1 via ORAL
  Filled 2021-01-20: qty 296

## 2021-01-20 MED ORDER — SODIUM CHLORIDE 0.9 % IV SOLN: INTRAVENOUS | Status: DC

## 2021-01-20 MED ORDER — ADULT MULTIVITAMIN W/MINERALS CH
1.0000 | ORAL_TABLET | Freq: Every day | ORAL | Status: DC
  Administered 2021-01-21 – 2021-01-25 (×5): 1 via ORAL
  Filled 2021-01-20 (×5): qty 1

## 2021-01-20 MED ORDER — VITAMIN B-12 1000 MCG PO TABS
1000.0000 ug | ORAL_TABLET | Freq: Every day | ORAL | Status: DC
  Administered 2021-01-20 – 2021-01-25 (×6): 1000 ug via ORAL
  Filled 2021-01-20 (×7): qty 1

## 2021-01-20 MED ORDER — PANTOPRAZOLE SODIUM 40 MG IV SOLR: 40.0000 mg | Freq: Once | INTRAVENOUS | Status: DC

## 2021-01-20 MED ORDER — SODIUM CHLORIDE 0.9 % IV SOLN
300.0000 mg | Freq: Once | INTRAVENOUS | Status: AC
  Administered 2021-01-21: 300 mg via INTRAVENOUS
  Filled 2021-01-20: qty 15

## 2021-01-20 MED ORDER — POLYETHYLENE GLYCOL 3350 17 GM/SCOOP PO POWD
1.0000 | Freq: Once | ORAL | Status: AC
  Administered 2021-01-20: 255 g via ORAL
  Filled 2021-01-20: qty 255

## 2021-01-20 MED ORDER — ONDANSETRON HCL 4 MG/2ML IJ SOLN
4.0000 mg | Freq: Three times a day (TID) | INTRAMUSCULAR | Status: DC | PRN
  Administered 2021-01-20: 4 mg via INTRAVENOUS
  Filled 2021-01-20: qty 2

## 2021-01-20 MED ORDER — MOMETASONE FURO-FORMOTEROL FUM 200-5 MCG/ACT IN AERO
2.0000 | INHALATION_SPRAY | Freq: Two times a day (BID) | RESPIRATORY_TRACT | Status: DC
  Administered 2021-01-20 – 2021-01-25 (×10): 2 via RESPIRATORY_TRACT
  Filled 2021-01-20: qty 8.8

## 2021-01-20 MED ORDER — SODIUM CHLORIDE 0.9 % IV SOLN
10.0000 mL/h | Freq: Once | INTRAVENOUS | Status: AC
  Administered 2021-01-20: 10 mL/h via INTRAVENOUS

## 2021-01-20 MED ORDER — PANTOPRAZOLE SODIUM 40 MG IV SOLR
40.0000 mg | Freq: Two times a day (BID) | INTRAVENOUS | Status: DC
  Administered 2021-01-20 – 2021-01-25 (×11): 40 mg via INTRAVENOUS
  Filled 2021-01-20 (×11): qty 40

## 2021-01-20 MED ORDER — HYDRALAZINE HCL 20 MG/ML IJ SOLN: 5.0000 mg | INTRAMUSCULAR | Status: DC | PRN

## 2021-01-20 MED ORDER — LEVOTHYROXINE SODIUM 112 MCG PO TABS
112.0000 ug | ORAL_TABLET | Freq: Every day | ORAL | Status: DC
  Administered 2021-01-21 – 2021-01-25 (×5): 112 ug via ORAL
  Filled 2021-01-20 (×6): qty 1

## 2021-01-20 MED ORDER — METOPROLOL TARTRATE 50 MG PO TABS: 100.0000 mg | ORAL_TABLET | Freq: Two times a day (BID) | ORAL | Status: DC

## 2021-01-20 MED ORDER — TRAZODONE HCL 100 MG PO TABS
100.0000 mg | ORAL_TABLET | Freq: Every day | ORAL | Status: DC
  Administered 2021-01-20 – 2021-01-24 (×5): 100 mg via ORAL
  Filled 2021-01-20 (×5): qty 1

## 2021-01-20 MED ORDER — DM-GUAIFENESIN ER 30-600 MG PO TB12: 1.0000 | ORAL_TABLET | Freq: Two times a day (BID) | ORAL | Status: DC | PRN

## 2021-01-20 MED ORDER — ACETAMINOPHEN 325 MG PO TABS
650.0000 mg | ORAL_TABLET | Freq: Four times a day (QID) | ORAL | Status: DC | PRN
  Administered 2021-01-23: 650 mg via ORAL
  Filled 2021-01-20: qty 2

## 2021-01-20 MED ORDER — FERROUS SULFATE 325 (65 FE) MG PO TABS
325.0000 mg | ORAL_TABLET | Freq: Every morning | ORAL | Status: DC
  Administered 2021-01-21 – 2021-01-25 (×5): 325 mg via ORAL
  Filled 2021-01-20 (×4): qty 1

## 2021-01-20 MED ORDER — FOLIC ACID 1 MG PO TABS
1.0000 mg | ORAL_TABLET | Freq: Every day | ORAL | Status: DC
  Administered 2021-01-20 – 2021-01-25 (×6): 1 mg via ORAL
  Filled 2021-01-20 (×6): qty 1

## 2021-01-20 NOTE — ED Provider Notes (Signed)
[facility] Emergency Department Provider Note  ____________________________________________   Event Date/Time   First MD Initiated Contact with Patient 01/20/21 1326     (approximate)  I have reviewed the triage vital signs and the nursing notes.   HISTORY  Chief Complaint Abnormal Lab    HPI [name] is a 75 y.o. female with COPD, hypertension who comes in for low hemoglobin.  Patient comes in for hemoglobin of 6.3. Patient states that she was just going in for routine labs and was noted that her hemoglobin was low so she was sent to the ER to try to figure out why it was low.  She denies any recent falls.  Denies any black tarry stools.  She thinks she might of had low blood levels in the past but from what I can tell this seems to be new.  She denies ever having a blood transfusion before.  She otherwise states that she is feeling well denies any chest pain, shortness of breath, fatigue, lightheadedness.  Unclear when the low blood level started, constant, unclear what brought it on, nothing made it better or worse  She is not on any blood thinners          Past Medical History:  Diagnosis Date  . Allergy   . Asthma   . Back pain   . Bronchitis   . COPD (chronic obstructive pulmonary disease) ([place])   . Degenerative disc disease, lumbar   . Depression   . FHx: cholecystectomy   . GERD (gastroesophageal reflux disease)   . H/O: hysterectomy   . Headache   . Hypertension   . Murmur   . Osteoporosis   . Pneumonia 03/31/16   being treated by PCP  . Restless legs syndrome   . Scoliosis   . Shingles   . Thyroid disease     Patient Active Problem List   Diagnosis Date Noted  . History of recent fall 07/30/2020  . Fracture of one rib, left side, initial encounter for closed fracture 07/30/2020  . Hypoxia 07/30/2020  . Chest pain, atypical 07/30/2020  . COPD exacerbation ([place]) 07/30/2020  . Community acquired pneumonia 03/20/2020   . Type 2 diabetes mellitus without complication ([place]) 01/60/1093  . Obesity, Class III, BMI 40-49.9 (morbid obesity) ([place]) 03/20/2020  . Acute respiratory failure with hypoxia ([place]) 03/19/2020  . Hyponatremia 04/27/2019  . Hallucinations, visual 04/27/2019  . Iron deficiency anemia 02/01/2018  . Acute exacerbation of chronic obstructive pulmonary disease (COPD) ([place]) 11/05/2016  . Spinal stenosis, lumbar region, with neurogenic claudication 07/08/2015  . Sacroiliac joint dysfunction 06/28/2015  . Greater trochanteric bursitis 04/10/2015  . Sacroiliac joint disease 03/14/2015  . Facet syndrome, lumbar 03/14/2015  . HTN (hypertension) 03/06/2015  . Thyroid disease 03/06/2015  . DDD (degenerative disc disease), cervical 03/04/2015  . DDD (degenerative disc disease), lumbar 03/04/2015  . DJD (degenerative joint disease) of knee 03/04/2015    Past Surgical History:  Procedure Laterality Date  . ABDOMINAL HYSTERECTOMY    . APPENDECTOMY    . BACK SURGERY    . CHOLECYSTECTOMY    . COLONOSCOPY WITH PROPOFOL N/A 01/16/2018   Procedure: COLONOSCOPY WITH PROPOFOL;  Surgeon: Toledo, Benay Pike, MD;  Location: [facility] ENDOSCOPY;  Service: Gastroenterology;  Laterality: N/A;  . ESOPHAGOGASTRODUODENOSCOPY (EGD) WITH PROPOFOL N/A 01/16/2018   Procedure: ESOPHAGOGASTRODUODENOSCOPY (EGD) WITH PROPOFOL;  Surgeon: Toledo, Benay Pike, MD;  Location: [facility] ENDOSCOPY;  Service: Gastroenterology;  Laterality: N/A;  . JOINT REPLACEMENT Left 1998  shoulder  . NECK SURGERY Bilateral   . ROTATOR CUFF REPAIR Right   . SHOULDER OPEN ROTATOR CUFF REPAIR Right   . SHOULDER SURGERY Left    replacement    Prior to Admission medications   Medication Sig Start Date End Date Taking? Authorizing Provider  albuterol (PROVENTIL HFA;VENTOLIN HFA) 108 (90 Base) MCG/ACT inhaler Inhale 1-2 puffs into the lungs every 6 (six) hours as needed for wheezing or shortness of breath.    [provider]  amLODipine (NORVASC)  5 MG tablet Take 5 mg by mouth daily.    [provider]  Cholecalciferol (VITAMIN D) 2000 UNITS tablet Take 2,000 Units by mouth daily.    [provider]  citalopram (CELEXA) 10 MG tablet Take 1 tablet (10 mg total) by mouth daily. 05/06/19   Vaughan Basta, MD  FEROSUL 325 (65 Fe) MG tablet Take 325 mg by mouth every morning. 07/16/20   [provider]  Fluticasone-Salmeterol (ADVAIR DISKUS) 250-50 MCG/DOSE AEPB Inhale 1 puff into the lungs 2 (two) times daily. 03/22/20 03/22/21  Sharen Hones, MD  furosemide (LASIX) 20 MG tablet Take 1 tablet (20 mg total) by mouth daily. 05/06/19   Vaughan Basta, MD  gabapentin (NEURONTIN) 100 MG capsule Take 2 capsules (200 mg total) by mouth at bedtime. 05/05/19   Vaughan Basta, MD  levothyroxine (SYNTHROID, LEVOTHROID) 112 MCG tablet Take 112 mcg by mouth daily before breakfast.    [provider]  meloxicam (MOBIC) 15 MG tablet Take 15 mg by mouth daily. 04/22/19   [provider]  metoprolol (LOPRESSOR) 100 MG tablet Take 100 mg by mouth 2 (two) times daily.    [provider]  Multiple Vitamin (MULTIVITAMIN) tablet Take 1 tablet by mouth daily. Reported on 04/05/2016    [provider]  NYSTATIN powder Apply 1 application topically 2 (two) times daily as needed. 02/26/20   [provider]  omeprazole (PRILOSEC) 20 MG capsule Take 20 mg by mouth daily.     [provider]  potassium chloride SA (K-DUR,KLOR-CON) 20 MEQ tablet Take 20 mEq by mouth daily.    [provider]  senna-docusate (SENOKOT-S) 8.6-50 MG tablet Take 1 tablet by mouth daily.    [provider]  tiotropium (SPIRIVA HANDIHALER) 18 MCG inhalation capsule Place 1 capsule (18 mcg total) into inhaler and inhale daily. 11/14/17   Laverle Hobby, MD    Allergies Adhesive [tape] and Other  Family History  Problem Relation Age of Onset  . Kidney disease Father   . Heart  disease Mother   . Diabetes Mother     Social History Social History   Tobacco Use  . Smoking status: Former Smoker    Packs/day: 1.00    Years: 30.00    Pack years: 30.00    Types: Cigarettes    Quit date: 11/04/2016    Years since quitting: 4.2  . Smokeless tobacco: Never Used  Vaping Use  . Vaping Use: Never used  Substance Use Topics  . Alcohol use: No    Alcohol/week: 0.0 standard drinks  . Drug use: No      Review of Systems Constitutional: No fever/chills Eyes: No visual changes. ENT: No sore throat. Cardiovascular: Denies chest pain. Respiratory: Denies shortness of breath. Gastrointestinal: No abdominal pain.  No nausea, no vomiting.  No diarrhea.  No constipation. Genitourinary: Negative for dysuria. Musculoskeletal: Negative for back pain. Skin: Negative for rash. Neurological: Negative for headaches, focal weakness or numbness. Low blood levels All  other ROS negative ____________________________________________   PHYSICAL EXAM:  VITAL SIGNS: ED Triage Vitals  Enc Vitals Group     BP 01/20/21 1311 90/72     Pulse Rate 01/20/21 1311 81     Resp 01/20/21 1311 20     Temp 01/20/21 1311 98.2 F (36.8 C)     Temp Source 01/20/21 1311 Oral     SpO2 --      Weight 01/20/21 1315 200 lb (90.7 kg)     Height 01/20/21 1315 4\' 10"  (1.473 m)     Head Circumference --      Peak Flow --      Pain Score 01/20/21 1314 7     Pain Loc --      Pain Edu? --      Excl. in [place]? --     Constitutional: Alert and oriented. Well appearing and in no acute distress. Eyes: Conjunctivae are normal. EOMI. Head: Atraumatic. Nose: No congestion/rhinnorhea. Mouth/Throat: Mucous membranes are moist.   Neck: No stridor. Trachea Midline. FROM Cardiovascular: Normal rate, regular rhythm. Grossly normal heart sounds.  Good peripheral circulation. Respiratory: Normal respiratory effort.  No retractions. Lungs CTAB. Gastrointestinal: Soft and nontender. No distention. No  abdominal bruits.  Musculoskeletal: No lower extremity tenderness nor edema.  No joint effusions. Neurologic:  Normal speech and language. No gross focal neurologic deficits are appreciated.  Skin:  Skin is warm, dry and intact. No rash noted. Psychiatric: Mood and affect are normal. Speech and behavior are normal. GU: No stool in the vault.  Some residue that I was able to test that was Hemoccult positive  ____________________________________________   LABS (all labs ordered are listed, but only abnormal results are displayed)  Labs Reviewed  CBC WITH DIFFERENTIAL/PLATELET - Abnormal; Notable for the following components:      Result Value   RBC 2.75 (*)    Hemoglobin 6.5 (*)    HCT 22.7 (*)    MCH 23.6 (*)    MCHC 28.6 (*)    RDW 17.9 (*)    nRBC 0.7 (*)    All other components within normal limits  COMPREHENSIVE METABOLIC PANEL - Abnormal; Notable for the following components:   Glucose, Bld 115 (*)    Total Protein 6.4 (*)    Albumin 3.1 (*)    All other components within normal limits  RETICULOCYTES - Abnormal; Notable for the following components:   RBC. 2.70 (*)    All other components within normal limits  RESP PANEL BY RT-PCR (FLU A&B, COVID) ARPGX2  VITAMIN B12  FOLATE  IRON AND TIBC  FERRITIN  LACTATE DEHYDROGENASE  PREPARE RBC (CROSSMATCH)  TYPE AND SCREEN   ____________________________________________   PROCEDURES  Procedure(s) performed (including Critical Care):  .Critical Care Performed by: Vanessa Nooksack, MD Authorized by: Vanessa Saxis, MD   Critical care provider statement:    Critical care time (minutes):  45   Critical care was necessary to treat or prevent imminent or life-threatening deterioration of the following conditions: Anemia requiring blood transfusion.   Critical care was time spent personally by me on the following activities:  Discussions with consultants, evaluation of patient's response to treatment, examination of patient,  ordering and performing treatments and interventions, ordering and review of laboratory studies, ordering and review of radiographic studies, pulse oximetry, re-evaluation of patient's condition, obtaining history from patient or surrogate and review of old charts .1-3 Lead EKG Interpretation Performed by: Vanessa [place], MD Authorized by: Vanessa [place],  MD     Interpretation: normal     ECG rate:  80s   ECG rate assessment: normal     Rhythm: sinus rhythm     Ectopy: none     Conduction: normal       ____________________________________________   INITIAL IMPRESSION / ASSESSMENT AND PLAN / ED COURSE  [name] was evaluated in Emergency Department on 01/20/2021 for the symptoms described in the history of present illness. She was evaluated in the context of the global COVID-19 pandemic, which necessitated consideration that the patient might be at risk for infection with the SARS-CoV-2 virus that causes COVID-19. Institutional protocols and algorithms that pertain to the evaluation of patients at risk for COVID-19 are in a state of rapid change based on information released by regulatory bodies including the CDC and federal and state organizations. These policies and algorithms were followed during the patient's care in the ED.    Patient is a 75 year old who comes in with low blood levels.  Denies any recent falls to suggest retroperitoneal hemorrhage.  Will get iron, B12, folate studies.  Patient is a rectal exam did not have a ton of stool to test.  There was a little residue that looked Hemoccult positive so we will give a dose of Protonix.  Is concerned for possibility of GI bleed.  Will discuss with the hospital team for admission due to the above and give 1 unit of blood.  We will keep in the cardiac monitor due to anemia.       ____________________________________________   FINAL CLINICAL IMPRESSION(S) / ED DIAGNOSES   Final diagnoses:  Symptomatic anemia       MEDICATIONS GIVEN DURING THIS VISIT:  Medications  0.9 %  sodium chloride infusion (has no administration in time range)  pantoprazole (PROTONIX) injection 40 mg (has no administration in time range)     ED Discharge Orders    None       Note:  This document was prepared using Dragon voice recognition software and may include unintentional dictation errors.   Vanessa [place], MD 01/20/21 [phone]

## 2021-01-20 NOTE — Consult Note (Signed)
Cephas Darby, MD [address]  [place]  [place], [place] [address]  Main: [phone]  Fax: [phone] Pager: [phone]   Consultation  Referring Provider:     No ref. provider found Primary Care Physician:  Baxter Hire, MD Primary Gastroenterologist:  Dr. Alice Reichert         Reason for Consultation:     Iron deficiency anemia  Date of Admission:  01/20/2021 Date of Consultation:  01/20/2021         HPI:   [name] is a 75 y.o. female with known history of obesity, COPD, hypertension is admitted with severe anemia.  Patient reports that she had routine labs done by her PCP today, found to have hemoglobin 6.3, MCV 85.7, normal platelets, normal BUN/creatinine.  Therefore, she was sent to ER.  Her last normal hemoglobin was 13.5 in 1/22.  She does have history of severe iron deficiency anemia back in April 2019, her ferritin was 10.  Her iron panel during this admission also showed persistent iron deficiency, ferritin 4, folate deficiency 5.3.  Patient denies any GI symptoms including abdominal pain, nausea, vomiting, melena, rectal bleeding.  Her granddaughter who is bedside reported that she has been less active within last few weeks.  Patient also reports that her stools have been dark but she thought that was normal.  She stopped taking iron supplements about 2 months ago.  She has history of severe chronic constipation, reports having bowel movements once a week or once every 2 weeks, this has resulted in loss of appetite and poor p.o. intake as well. GI is consulted for further evaluation of iron deficiency anemia  NSAIDs: None  Antiplts/Anticoagulants/Anti thrombotics: None  GI Procedures:  EGD and colonoscopy for iron deficiency anemia 01/16/2018  Non-occlusive lower esophageal "B" ring, A TTS dilator was passed through the scope. Dilation with an 18-19-20 mm balloon dilator was performed to 20 mm. The dilation site was examined and showed no change.  Estimated blood loss: none. - Large paraesophageal hernia. - No gross lesions in the stomach. - Normal examined duodenum. - The examination was otherwise normal. - No specimens collected.  - Diverticulosis in the sigmoid colon. - Melanosis in the colon. - Non-bleeding internal hemorrhoids. - The examination was otherwise normal. - No specimens collected.  Past Medical History:  Diagnosis Date  . Allergy   . Asthma   . Back pain   . Bronchitis   . COPD (chronic obstructive pulmonary disease) ([place])   . Degenerative disc disease, lumbar   . Depression   . FHx: cholecystectomy   . GERD (gastroesophageal reflux disease)   . H/O: hysterectomy   . Headache   . Hypertension   . Murmur   . Osteoporosis   . Pneumonia 03/31/16   being treated by PCP  . Restless legs syndrome   . Scoliosis   . Shingles   . Thyroid disease     Past Surgical History:  Procedure Laterality Date  . ABDOMINAL HYSTERECTOMY    . APPENDECTOMY    . BACK SURGERY    . CHOLECYSTECTOMY    . COLONOSCOPY WITH PROPOFOL N/A 01/16/2018   Procedure: COLONOSCOPY WITH PROPOFOL;  Surgeon: Toledo, Benay Pike, MD;  Location: [facility] ENDOSCOPY;  Service: Gastroenterology;  Laterality: N/A;  . ESOPHAGOGASTRODUODENOSCOPY (EGD) WITH PROPOFOL N/A 01/16/2018   Procedure: ESOPHAGOGASTRODUODENOSCOPY (EGD) WITH PROPOFOL;  Surgeon: Toledo, Benay Pike, MD;  Location: [facility] ENDOSCOPY;  Service: Gastroenterology;  Laterality: N/A;  . JOINT REPLACEMENT  Left 1998   shoulder  . NECK SURGERY Bilateral   . ROTATOR CUFF REPAIR Right   . SHOULDER OPEN ROTATOR CUFF REPAIR Right   . SHOULDER SURGERY Left    replacement    Prior to Admission medications   Medication Sig Start Date End Date Taking? Authorizing Provider  amLODipine (NORVASC) 5 MG tablet Take 5 mg by mouth daily.   Yes [provider]  Cholecalciferol (VITAMIN D) 2000 UNITS tablet Take 2,000 Units by mouth daily.   Yes [provider]  citalopram (CELEXA) 40  MG tablet Take 40 mg by mouth daily. 12/03/20  Yes [provider]  Fluticasone-Salmeterol (ADVAIR DISKUS) 250-50 MCG/DOSE AEPB Inhale 1 puff into the lungs 2 (two) times daily. 03/22/20 03/22/21 Yes Sharen Hones, MD  furosemide (LASIX) 20 MG tablet Take 1 tablet (20 mg total) by mouth daily. 05/06/19  Yes Vaughan Basta, MD  gabapentin (NEURONTIN) 100 MG capsule Take 2 capsules (200 mg total) by mouth at bedtime. 05/05/19  Yes Vaughan Basta, MD  levothyroxine (SYNTHROID, LEVOTHROID) 112 MCG tablet Take 112 mcg by mouth daily before breakfast.   Yes [provider]  metoprolol (LOPRESSOR) 100 MG tablet Take 100 mg by mouth 2 (two) times daily.   Yes [provider]  Multiple Vitamin (MULTIVITAMIN) tablet Take 1 tablet by mouth daily. Reported on 04/05/2016   Yes [provider]  omeprazole (PRILOSEC) 20 MG capsule Take 20 mg by mouth daily.    Yes [provider]  potassium chloride SA (K-DUR,KLOR-CON) 20 MEQ tablet Take 20 mEq by mouth daily.   Yes [provider]  traZODone (DESYREL) 100 MG tablet Take 100 mg by mouth at bedtime. 12/30/20  Yes [provider]  albuterol (PROVENTIL HFA;VENTOLIN HFA) 108 (90 Base) MCG/ACT inhaler Inhale 1-2 puffs into the lungs every 6 (six) hours as needed for wheezing or shortness of breath.    [provider]  FEROSUL 325 (65 Fe) MG tablet Take 325 mg by mouth every morning. Patient not taking: Reported on 01/20/2021 07/16/20   [provider]  senna-docusate (SENOKOT-S) 8.6-50 MG tablet Take 1 tablet by mouth daily.    [provider]  tiotropium (SPIRIVA HANDIHALER) 18 MCG inhalation capsule Place 1 capsule (18 mcg total) into inhaler and inhale daily. 11/14/17   Laverle Hobby, MD    Current Facility-Administered Medications:  .  0.9 %  sodium chloride infusion, 10 mL/hr, Intravenous, Once, Ivor Costa, MD .  0.9 %  sodium chloride infusion, , Intravenous,  Continuous, Ivor Costa, MD, Last Rate: 75 mL/hr at 01/20/21 1500, New Bag at 01/20/21 1500 .  0.9 %  sodium chloride infusion, , Intravenous, Continuous, Lorita Forinash, Tally Due, MD .  acetaminophen (TYLENOL) tablet 650 mg, 650 mg, Oral, Q6H PRN, Ivor Costa, MD .  albuterol (VENTOLIN HFA) 108 (90 Base) MCG/ACT inhaler 2 puff, 2 puff, Inhalation, Q4H PRN, Ivor Costa, MD .  dextromethorphan-guaiFENesin ([place] DM) 30-600 MG per 12 hr tablet 1 tablet, 1 tablet, Oral, BID PRN, Ivor Costa, MD .  folic acid (FOLVITE) tablet 1 mg, 1 mg, Oral, Daily, Nethaniel Mattie, Tally Due, MD .  hydrALAZINE (APRESOLINE) injection 5 mg, 5 mg, Intravenous, Q2H PRN, Ivor Costa, MD .  iron sucrose (VENOFER) 300 mg in sodium chloride 0.9 % 250 mL IVPB, 300 mg, Intravenous, Once, Zeplin Aleshire, Tally Due, MD .  magnesium citrate solution 1 Bottle, 1 Bottle, Oral, Once, Daffney Greenly, Tally Due, MD .  ondansetron (ZOFRAN) injection 4 mg, 4 mg, Intravenous, Q8H PRN,  Ivor Costa, MD .  pantoprazole (PROTONIX) injection 40 mg, 40 mg, Intravenous, Q12H, Ivor Costa, MD, 40 mg at 01/20/21 1457 .  polyethylene glycol powder (GLYCOLAX/MIRALAX) container 255 g, 1 Container, Oral, Once, Sanela Evola, Tally Due, MD .  vitamin B-12 (CYANOCOBALAMIN) tablet 1,000 mcg, 1,000 mcg, Oral, Daily, Reeva Davern, Tally Due, MD  Current Outpatient Medications:  .  amLODipine (NORVASC) 5 MG tablet, Take 5 mg by mouth daily., Disp: , Rfl:  .  Cholecalciferol (VITAMIN D) 2000 UNITS tablet, Take 2,000 Units by mouth daily., Disp: , Rfl:  .  citalopram (CELEXA) 40 MG tablet, Take 40 mg by mouth daily., Disp: , Rfl:  .  Fluticasone-Salmeterol (ADVAIR DISKUS) 250-50 MCG/DOSE AEPB, Inhale 1 puff into the lungs 2 (two) times daily., Disp: 60 each, Rfl: 0 .  furosemide (LASIX) 20 MG tablet, Take 1 tablet (20 mg total) by mouth daily., Disp: 30 tablet, Rfl: 0 .  gabapentin (NEURONTIN) 100 MG capsule, Take 2 capsules (200 mg total) by mouth at bedtime., Disp: 30 capsule, Rfl: 0 .   levothyroxine (SYNTHROID, LEVOTHROID) 112 MCG tablet, Take 112 mcg by mouth daily before breakfast., Disp: , Rfl:  .  metoprolol (LOPRESSOR) 100 MG tablet, Take 100 mg by mouth 2 (two) times daily., Disp: , Rfl:  .  Multiple Vitamin (MULTIVITAMIN) tablet, Take 1 tablet by mouth daily. Reported on 04/05/2016, Disp: , Rfl:  .  omeprazole (PRILOSEC) 20 MG capsule, Take 20 mg by mouth daily. , Disp: , Rfl:  .  potassium chloride SA (K-DUR,KLOR-CON) 20 MEQ tablet, Take 20 mEq by mouth daily., Disp: , Rfl:  .  traZODone (DESYREL) 100 MG tablet, Take 100 mg by mouth at bedtime., Disp: , Rfl:  .  albuterol (PROVENTIL HFA;VENTOLIN HFA) 108 (90 Base) MCG/ACT inhaler, Inhale 1-2 puffs into the lungs every 6 (six) hours as needed for wheezing or shortness of breath., Disp: , Rfl:  .  FEROSUL 325 (65 Fe) MG tablet, Take 325 mg by mouth every morning. (Patient not taking: Reported on 01/20/2021), Disp: , Rfl:  .  senna-docusate (SENOKOT-S) 8.6-50 MG tablet, Take 1 tablet by mouth daily., Disp: , Rfl:  .  tiotropium (SPIRIVA HANDIHALER) 18 MCG inhalation capsule, Place 1 capsule (18 mcg total) into inhaler and inhale daily., Disp: 30 capsule, Rfl: 12  Family History  Problem Relation Age of Onset  . Kidney disease Father   . Heart disease Mother   . Diabetes Mother      Social History   Tobacco Use  . Smoking status: Former Smoker    Packs/day: 1.00    Years: 30.00    Pack years: 30.00    Types: Cigarettes    Quit date: 11/04/2016    Years since quitting: 4.2  . Smokeless tobacco: Never Used  Vaping Use  . Vaping Use: Never used  Substance Use Topics  . Alcohol use: No    Alcohol/week: 0.0 standard drinks  . Drug use: No    Allergies as of 01/20/2021 - Review Complete 01/20/2021  Allergen Reaction Noted  . Levofloxacin Shortness Of Breath 10/01/2020  . Adhesive [tape] Other (See Comments) 02/18/2015  . Other Rash 03/04/2015    Review of Systems:    All systems reviewed and negative except  where noted in HPI.   Physical Exam:  Vital signs in last 24 hours: Temp:  [98.2 F (36.8 C)] 98.2 F (36.8 C) (03/24 1311) Pulse Rate:  [74-82] 74 (03/24 1430) Resp:  [18-23] 18 (03/24 1430) BP: (90-122)/(69-72) 104/72 (03/24  1430) SpO2:  [90 %-92 %] 90 % (03/24 1430) Weight:  [90.7 kg] 90.7 kg (03/24 1315)   General:   Pleasant, cooperative in NAD Head:  Normocephalic and atraumatic. Eyes:   No icterus.   Conjunctiva pale. PERRLA. Ears:  Normal auditory acuity. Neck:  Supple; no masses or thyroidomegaly Lungs: Respirations even and unlabored. Lungs clear to auscultation bilaterally.   No wheezes, crackles, or rhonchi.  Heart:  Regular rate and rhythm;  Without murmur, clicks, rubs or gallops Abdomen:  Soft, nondistended, nontender. Normal bowel sounds. No appreciable masses or hepatomegaly.  No rebound or guarding.  Rectal:  Not performed. Msk:  Symmetrical without gross deformities.  Strength generalized weakness Extremities:  Without edema, cyanosis or clubbing. Neurologic:  Alert and oriented x3;  grossly normal neurologically. Skin:  Intact without significant lesions or rashes. Psych:  Alert and cooperative. Normal affect.  LAB RESULTS: CBC Latest Ref Rng & Units 01/20/2021 01/20/2021 07/30/2020  WBC 4.0 - 10.5 K/uL 6.2 6.8 13.0(H)  Hemoglobin 12.0 - 15.0 g/dL 6.3(L) 6.5(L) 11.9(L)  Hematocrit 36.0 - 46.0 % 21.5(L) 22.7(L) 38.9  Platelets 150 - 400 K/uL 306 322 286    BMET BMP Latest Ref Rng & Units 01/20/2021 07/30/2020 07/29/2020  Glucose 70 - 99 mg/dL 115(H) - 110(H)  BUN 8 - 23 mg/dL 12 - 11  Creatinine 0.44 - 1.00 mg/dL 0.80 0.71 0.73  Sodium 135 - 145 mmol/L 137 - 134(L)  Potassium 3.5 - 5.1 mmol/L 4.1 - 4.0  Chloride 98 - 111 mmol/L 99 - 94(L)  CO2 22 - 32 mmol/L 29 - 28  Calcium 8.9 - 10.3 mg/dL 8.9 - 9.0    LFT Hepatic Function Latest Ref Rng & Units 01/20/2021 04/27/2019 04/25/2019  Total Protein 6.5 - 8.1 g/dL 6.4(L) 7.3 7.2  Albumin 3.5 - 5.0 g/dL 3.1(L)  4.0 3.8  AST 15 - 41 U/L _0 ALT 0 - 44 U/L _1 Alk Phosphatase 38 - 126 U/L 71 78 66  Total Bilirubin 0.3 - 1.2 mg/dL 0.5 0.6 0.5     STUDIES: No results found.    Impression / Plan:   [name] is a 75 y.o. female with history of COPD, hypertension, known history of iron deficiency anemia is admitted with severe iron deficiency anemia with no evidence of active GI bleed, reported history of dark stools within last 2 months.  Patient also has folate deficiency, B12 levels are pending.  Recommend upper endoscopy and colonoscopy for further evaluation due to recurrence of iron deficiency anemia.  She does have history of large hiatal hernia.  Differentials include Cameron erosions or ulcers, AVMs or peptic ulcer disease Agree with Protonix 40 mg IV twice daily Okay with clear liquid diet Recommend upper endoscopy and colonoscopy, will plan for tomorrow Bowel prep ordered N.p.o. effective 5 AM tomorrow Recommend parenteral iron therapy Start folic acid 1 mg p.o. daily Start B12 1076mg daily  Thank you for involving me in the care of this patient.      LOS: 0 days   RSherri Sear MD  01/20/2021, 4:08 PM   Note: This dictation was prepared with Dragon dictation along with smaller phrase technology. Any transcriptional errors that result from this process are unintentional.

## 2021-01-20 NOTE — ED Notes (Signed)
Called lab. They are aware that new lab orders were placed after they had come to redraw type and screen. They will come back to draw other labs.

## 2021-01-20 NOTE — ED Notes (Signed)
Lab called. Type and screen hemolyzed. Tried to redraw off IV and start no IV but not able to. Called lab back to come draw labs.

## 2021-01-20 NOTE — ED Notes (Addendum)
Pt to ED because was called by HCP office today and told to go to ER because of low hct and hgb on labs. Pt denies dizziness or weakness. Denies seeing blood in stool. Pt in bed, family at bedside. EDP at bedside.

## 2021-01-20 NOTE — H&P (Signed)
History and Physical    [name]:[id number] DOB: 1946-03-01 DOA: 01/20/2021  Referring MD/NP/PA:   PCP: Baxter Hire, MD   Patient coming from:  The patient is coming from home.  At baseline, pt is partially dependent for most of ADL.        Chief Complaint: Abnormal lab with low hemoglobin  HPI: [name] is a 75 y.o. female with medical history significant of HTN, COPD, GERD, hypothyroidism, depression, iron deficiency anemia, degenerative disc disease, RLS, who presents with abnormal lab with low hemoglobin.  Pt states that she had regular PCP follow-up visit today, and was found to have low hemoglobin.  She was sent to ED for further evaluation and treatment.  Patient denies nausea, vomiting, diarrhea.  She states that she had mild minimal right lower quadrant abdominal pain earlier, which has resolved.  Currently no abdominal pain.  Patient states that she has hx of  COPD, and has chronic mild cough and shortness of breath, which has not changed.  No chest pain, fever or chills.  Denies rectal bleeding or dark stool.  No lightheadedness or dizziness.   ED Course: pt was found to have hemoglobin 6.5 (11.9 on 07/30/2020), positive FOBT, pending COVID-19 PCR, electrolytes renal function okay, anemia panel consistent with iron deficiency, temperature normal, soft blood pressure, heart rate 81, RR 21, oxygen saturation 92% on room air.  Patient is placed on MedSurg bed for position, Dr. Marius Ditch of GI is consulted.  Review of Systems:   General: no fevers, chills, no body weight gain, fatigue HEENT: no blurry vision, hearing changes or sore throat Respiratory: has dyspnea, coughing, no wheezing CV: no chest pain, no palpitations GI: no nausea, vomiting, abdominal pain, diarrhea, constipation GU: no dysuria, burning on urination, increased urinary frequency, hematuria  Ext: has mild leg edema Neuro: no unilateral weakness, numbness, or tingling, no vision change or hearing  loss Skin: no rash, no skin tear. MSK: No muscle spasm, no deformity, no limitation of range of movement in spin Heme: No easy bruising.  Travel history: No recent long distant travel.  Allergy:  Allergies  Allergen Reactions  . Levofloxacin Shortness Of Breath  . Adhesive [Tape] Other (See Comments)  . Other Rash    Plastic tape    Past Medical History:  Diagnosis Date  . Allergy   . Asthma   . Back pain   . Bronchitis   . COPD (chronic obstructive pulmonary disease) ([place])   . Degenerative disc disease, lumbar   . Depression   . FHx: cholecystectomy   . GERD (gastroesophageal reflux disease)   . H/O: hysterectomy   . Headache   . Hypertension   . Murmur   . Osteoporosis   . Pneumonia 03/31/16   being treated by PCP  . Restless legs syndrome   . Scoliosis   . Shingles   . Thyroid disease     Past Surgical History:  Procedure Laterality Date  . ABDOMINAL HYSTERECTOMY    . APPENDECTOMY    . BACK SURGERY    . CHOLECYSTECTOMY    . COLONOSCOPY WITH PROPOFOL N/A 01/16/2018   Procedure: COLONOSCOPY WITH PROPOFOL;  Surgeon: Toledo, Benay Pike, MD;  Location: [facility] ENDOSCOPY;  Service: Gastroenterology;  Laterality: N/A;  . ESOPHAGOGASTRODUODENOSCOPY (EGD) WITH PROPOFOL N/A 01/16/2018   Procedure: ESOPHAGOGASTRODUODENOSCOPY (EGD) WITH PROPOFOL;  Surgeon: Toledo, Benay Pike, MD;  Location: [facility] ENDOSCOPY;  Service: Gastroenterology;  Laterality: N/A;  . JOINT REPLACEMENT Left 1998   shoulder  .  NECK SURGERY Bilateral   . ROTATOR CUFF REPAIR Right   . SHOULDER OPEN ROTATOR CUFF REPAIR Right   . SHOULDER SURGERY Left    replacement    Social History:  reports that she quit smoking about 4 years ago. Her smoking use included cigarettes. She has a 30.00 pack-year smoking history. She has never used smokeless tobacco. She reports that she does not drink alcohol and does not use drugs.  Family History:  Family History  Problem Relation Age of Onset  . Kidney disease Father    . Heart disease Mother   . Diabetes Mother      Prior to Admission medications   Medication Sig Start Date End Date Taking? Authorizing Provider  albuterol (PROVENTIL HFA;VENTOLIN HFA) 108 (90 Base) MCG/ACT inhaler Inhale 1-2 puffs into the lungs every 6 (six) hours as needed for wheezing or shortness of breath.    [provider]  amLODipine (NORVASC) 5 MG tablet Take 5 mg by mouth daily.    [provider]  Cholecalciferol (VITAMIN D) 2000 UNITS tablet Take 2,000 Units by mouth daily.    [provider]  citalopram (CELEXA) 10 MG tablet Take 1 tablet (10 mg total) by mouth daily. 05/06/19   Vaughan Basta, MD  FEROSUL 325 (65 Fe) MG tablet Take 325 mg by mouth every morning. 07/16/20   [provider]  Fluticasone-Salmeterol (ADVAIR DISKUS) 250-50 MCG/DOSE AEPB Inhale 1 puff into the lungs 2 (two) times daily. 03/22/20 03/22/21  Sharen Hones, MD  furosemide (LASIX) 20 MG tablet Take 1 tablet (20 mg total) by mouth daily. 05/06/19   Vaughan Basta, MD  gabapentin (NEURONTIN) 100 MG capsule Take 2 capsules (200 mg total) by mouth at bedtime. 05/05/19   Vaughan Basta, MD  levothyroxine (SYNTHROID, LEVOTHROID) 112 MCG tablet Take 112 mcg by mouth daily before breakfast.    [provider]  meloxicam (MOBIC) 15 MG tablet Take 15 mg by mouth daily. 04/22/19   [provider]  metoprolol (LOPRESSOR) 100 MG tablet Take 100 mg by mouth 2 (two) times daily.    [provider]  Multiple Vitamin (MULTIVITAMIN) tablet Take 1 tablet by mouth daily. Reported on 04/05/2016    [provider]  NYSTATIN powder Apply 1 application topically 2 (two) times daily as needed. 02/26/20   [provider]  omeprazole (PRILOSEC) 20 MG capsule Take 20 mg by mouth daily.     [provider]  potassium chloride SA (K-DUR,KLOR-CON) 20 MEQ tablet Take 20 mEq by mouth daily.    [provider]  senna-docusate  (SENOKOT-S) 8.6-50 MG tablet Take 1 tablet by mouth daily.    [provider]  tiotropium (SPIRIVA HANDIHALER) 18 MCG inhalation capsule Place 1 capsule (18 mcg total) into inhaler and inhale daily. 11/14/17   Laverle Hobby, MD    Physical Exam: Vitals:   01/20/21 1400 01/20/21 1430 01/20/21 1709 01/20/21 1724  BP: 99/69 104/72 (!) 89/58 125/68  Pulse: 82 74 75 96  Resp: (!) 23 18 16 17   Temp:   98.6 F (37 C) 98.6 F (37 C)  TempSrc:   Oral Oral  SpO2: 92% 90% 97% 93%  Weight:      Height:       General: Not in acute distress HEENT:       Eyes: PERRL, EOMI, no scleral icterus.       ENT: No discharge from the ears and nose, no pharynx injection, no tonsillar enlargement.  Neck: No JVD, no bruit, no mass felt. Heme: No neck lymph node enlargement. Cardiac: S1/S2, RRR, No gallops or rubs. Respiratory: No rales, wheezing, rhonchi or rubs. GI: Soft, nondistended, nontender, no rebound pain, no organomegaly, BS present. GU: No hematuria Ext: has trace leg edema bilaterally. 1+DP/PT pulse bilaterally. Musculoskeletal: No joint deformities, No joint redness or warmth, no limitation of ROM in spin. Skin: No rashes.  Neuro: Alert, oriented X3, cranial nerves II-XII grossly intact, moves all extremities normally. Psych: Patient is not psychotic, no suicidal or hemocidal ideation.  Labs on Admission: I have personally reviewed following labs and imaging studies  CBC: Recent Labs  Lab 01/20/21 1325 01/20/21 1444  WBC 6.8 6.2  NEUTROABS 4.8  --   HGB 6.5* 6.3*  HCT 22.7* 21.5*  MCV 82.5 82.4  PLT 322 998   Basic Metabolic Panel: Recent Labs  Lab 01/20/21 1325  NA 137  K 4.1  CL 99  CO2 29  GLUCOSE 115*  BUN 12  CREATININE 0.80  CALCIUM 8.9   GFR: Estimated Creatinine Clearance: 60.1 mL/min (by C-G formula based on SCr of 0.8 mg/dL). Liver Function Tests: Recent Labs  Lab 01/20/21 1325  AST 16  ALT 10  ALKPHOS 71  BILITOT 0.5  PROT 6.4*   ALBUMIN 3.1*   No results for input(s): LIPASE, AMYLASE in the last 168 hours. No results for input(s): AMMONIA in the last 168 hours. Coagulation Profile: Recent Labs  Lab 01/20/21 1444  INR 1.1   Cardiac Enzymes: No results for input(s): CKTOTAL, CKMB, CKMBINDEX, TROPONINI in the last 168 hours. BNP (last 3 results) No results for input(s): PROBNP in the last 8760 hours. HbA1C: No results for input(s): HGBA1C in the last 72 hours. CBG: No results for input(s): GLUCAP in the last 168 hours. Lipid Profile: No results for input(s): CHOL, HDL, LDLCALC, TRIG, CHOLHDL, LDLDIRECT in the last 72 hours. Thyroid Function Tests: No results for input(s): TSH, T4TOTAL, FREET4, T3FREE, THYROIDAB in the last 72 hours. Anemia Panel: Recent Labs    01/20/21 1325  FOLATE 5.3*  FERRITIN 4*  TIBC 500*  IRON 12*  RETICCTPCT 2.1   Urine analysis:    Component Value Date/Time   COLORURINE YELLOW (A) 03/19/2020 0918   APPEARANCEUR CLEAR (A) 03/19/2020 0918   APPEARANCEUR Clear 03/05/2014 1731   LABSPEC 1.015 03/19/2020 0918   LABSPEC 1.009 03/05/2014 1731   PHURINE 5.0 03/19/2020 0918   GLUCOSEU NEGATIVE 03/19/2020 0918   GLUCOSEU Negative 03/05/2014 1731   HGBUR NEGATIVE 03/19/2020 0918   BILIRUBINUR NEGATIVE 03/19/2020 0918   BILIRUBINUR Negative 03/05/2014 1731   KETONESUR NEGATIVE 03/19/2020 0918   PROTEINUR NEGATIVE 03/19/2020 0918   NITRITE NEGATIVE 03/19/2020 0918   LEUKOCYTESUR NEGATIVE 03/19/2020 0918   LEUKOCYTESUR Negative 03/05/2014 1731   Sepsis Labs: @LABRCNTIP (procalcitonin:4,lacticidven:4) ) Recent Results (from the past 240 hour(s))  Resp Panel by RT-PCR (Flu A&B, Covid) Nasopharyngeal Swab     Status: None   Collection Time: 01/20/21  2:40 PM   Specimen: Nasopharyngeal Swab; Nasopharyngeal(NP) swabs in vial transport medium  Result Value Ref Range Status   SARS Coronavirus 2 by RT PCR NEGATIVE NEGATIVE Final    Comment: (NOTE) SARS-CoV-2 target nucleic  acids are NOT DETECTED.  The SARS-CoV-2 RNA is generally detectable in upper respiratory specimens during the acute phase of infection. The lowest concentration of SARS-CoV-2 viral copies this assay can detect is 138 copies/mL. A negative result does not preclude SARS-Cov-2 infection and should not be used as the sole  basis for treatment or other patient management decisions. A negative result may occur with  improper specimen collection/handling, submission of specimen other than nasopharyngeal swab, presence of viral mutation(s) within the areas targeted by this assay, and inadequate number of viral copies(<138 copies/mL). A negative result must be combined with clinical observations, patient history, and epidemiological information. The expected result is Negative.  Fact Sheet for Patients:  EntrepreneurPulse.com.au  Fact Sheet for Healthcare Providers:  IncredibleEmployment.be  This test is no t yet approved or cleared by the [place] FDA and  has been authorized for detection and/or diagnosis of SARS-CoV-2 by FDA under an Emergency Use Authorization (EUA). This EUA will remain  in effect (meaning this test can be used) for the duration of the COVID-19 declaration under Section 564(b)(1) of the Act, 21 U.S.C.section 360bbb-3(b)(1), unless the authorization is terminated  or revoked sooner.       Influenza A by PCR NEGATIVE NEGATIVE Final   Influenza B by PCR NEGATIVE NEGATIVE Final    Comment: (NOTE) The Xpert Xpress SARS-CoV-2/FLU/RSV plus assay is intended as an aid in the diagnosis of influenza from Nasopharyngeal swab specimens and should not be used as a sole basis for treatment. Nasal washings and aspirates are unacceptable for Xpert Xpress SARS-CoV-2/FLU/RSV testing.  Fact Sheet for Patients: EntrepreneurPulse.com.au  Fact Sheet for Healthcare Providers: IncredibleEmployment.be  This  test is not yet approved or cleared by the [place] FDA and has been authorized for detection and/or diagnosis of SARS-CoV-2 by FDA under an Emergency Use Authorization (EUA). This EUA will remain in effect (meaning this test can be used) for the duration of the COVID-19 declaration under Section 564(b)(1) of the Act, 21 U.S.C. section 360bbb-3(b)(1), unless the authorization is terminated or revoked.  Performed at [facility], [address] [place], [place] [address]      Radiological Exams on Admission: No results found.   EKG: I have personally reviewed.  Sinus rhythm, QTC 455, borderline left axis deviation, poor R wave progression  Assessment/Plan Principal Problem:   Blood loss anemia Active Problems:   HTN (hypertension)   Iron deficiency anemia   GI bleeding   GERD (gastroesophageal reflux disease)   Hypothyroidism   Depression   COPD (chronic obstructive pulmonary disease) ([facility])   Blood loss anemia and GI bleeding: hgb 11.9 -->6.5. Dr. Marius Ditch of GI is consulted --> will do colonoscopy and EGD tomorrow morning, - will place in med-surg bed obs - transfuse 2 units of blood now - Start IV pantoprazole 40 mg bid - Zofran IV for nausea - Avoid NSAIDs and SQ heparin - Maintain IV access (2 large bore IVs if possible). - Monitor closely and follow q6h cbc, transfuse as necessary, if Hgb<7.0 - LaB: INR, PTT and type screen -Dr. Marius Ditch recommend parenteral iron therapy --> Venofer 300 mg IV - Start folic acid 1 mg p.o. daily - Start B12 1043mcg daily  HTN (hypertension): Blood pressure soft -Hold Lasix and amlodipine -hold metoprolol -IV hydralazine as needed  Iron deficiency anemia -IV iron as above -Continue oral iron supplement  GERD (gastroesophageal reflux disease) -On IV Protonix  Hypothyroidism -Synthroid  Depression -Continue home medications  COPD: Stable -Bronchodilators     DVT ppx: SCD Code Status: Full code Family  Communication:   Yes, patient's care giver at bed side Disposition Plan:  Anticipate discharge back to previous environment Consults called:  Dr. Marius Ditch of GI Admission status and Level of care: Med-Surg:    Med-surg bed for obs  Status is: Observation  The patient remains OBS appropriate and will d/c before 2 midnights.  Dispo: The patient is from: Home              Anticipated d/c is to: Home              Patient currently is not medically stable to d/c.   Difficult to place patient No           Date of Service 01/20/2021    [facility]   If 7PM-7AM, please contact night-coverage [email or web address] 01/20/2021, 6:20 PM

## 2021-01-20 NOTE — ED Triage Notes (Signed)
Pt sent here by md for hemoglobin 6.3. pt went to MD only to have labs drawn. Pt was asymptomatic. Pt denies active bleeding.

## 2021-01-20 NOTE — ED Notes (Signed)
Lab came and collected blood. It appears that new orders may have been placed since then. Will consult with Dr Blaine Hamper.

## 2021-01-20 NOTE — Plan of Care (Signed)
  Problem: Education: Goal: Knowledge of General Education information will improve Description: Including pain rating scale, medication(s)/side effects and non-pharmacologic comfort measures 01/20/2021 1728 by Cristela Blue, RN Outcome: Progressing 01/20/2021 1728 by Cristela Blue, RN Outcome: Progressing   Problem: Health Behavior/Discharge Planning: Goal: Ability to manage health-related needs will improve 01/20/2021 1728 by Cristela Blue, RN Outcome: Progressing 01/20/2021 1728 by Cristela Blue, RN Outcome: Progressing   Problem: Clinical Measurements: Goal: Ability to maintain clinical measurements within normal limits will improve 01/20/2021 1728 by Cristela Blue, RN Outcome: Progressing 01/20/2021 1728 by Cristela Blue, RN Outcome: Progressing Goal: Will remain free from infection 01/20/2021 1728 by Cristela Blue, RN Outcome: Progressing 01/20/2021 1728 by Cristela Blue, RN Outcome: Progressing Goal: Diagnostic test results will improve 01/20/2021 1728 by Cristela Blue, RN Outcome: Progressing 01/20/2021 1728 by Cristela Blue, RN Outcome: Progressing Goal: Respiratory complications will improve 01/20/2021 1728 by Cristela Blue, RN Outcome: Progressing 01/20/2021 1728 by Cristela Blue, RN Outcome: Progressing Goal: Cardiovascular complication will be avoided 01/20/2021 1728 by Cristela Blue, RN Outcome: Progressing 01/20/2021 1728 by Cristela Blue, RN Outcome: Progressing   Problem: Activity: Goal: Risk for activity intolerance will decrease 01/20/2021 1728 by Cristela Blue, RN Outcome: Progressing 01/20/2021 1728 by Cristela Blue, RN Outcome: Progressing   Problem: Nutrition: Goal: Adequate nutrition will be maintained 01/20/2021 1728 by Cristela Blue, RN Outcome: Progressing 01/20/2021 1728 by Cristela Blue, RN Outcome: Progressing   Problem: Coping: Goal: Level of anxiety will decrease 01/20/2021 1728 by Cristela Blue, RN Outcome:  Progressing 01/20/2021 1728 by Cristela Blue, RN Outcome: Progressing   Problem: Elimination: Goal: Will not experience complications related to bowel motility 01/20/2021 1728 by Cristela Blue, RN Outcome: Progressing 01/20/2021 1728 by Cristela Blue, RN Outcome: Progressing Goal: Will not experience complications related to urinary retention 01/20/2021 1728 by Cristela Blue, RN Outcome: Progressing 01/20/2021 1728 by Cristela Blue, RN Outcome: Progressing   Problem: Pain Managment: Goal: General experience of comfort will improve 01/20/2021 1728 by Cristela Blue, RN Outcome: Progressing 01/20/2021 1728 by Cristela Blue, RN Outcome: Progressing   Problem: Safety: Goal: Ability to remain free from injury will improve 01/20/2021 1728 by Cristela Blue, RN Outcome: Progressing 01/20/2021 1728 by Cristela Blue, RN Outcome: Progressing   Problem: Skin Integrity: Goal: Risk for impaired skin integrity will decrease 01/20/2021 1728 by Cristela Blue, RN Outcome: Progressing 01/20/2021 1728 by Cristela Blue, RN Outcome: Progressing

## 2021-01-21 ENCOUNTER — Encounter: Payer: Self-pay | Admitting: Internal Medicine

## 2021-01-21 ENCOUNTER — Observation Stay: Payer: Medicare Other | Admitting: Anesthesiology

## 2021-01-21 ENCOUNTER — Inpatient Hospital Stay: Payer: Medicare Other

## 2021-01-21 ENCOUNTER — Encounter
Admission: EM | Disposition: A | Discharge status: Skilled Nursing Facility | Payer: Self-pay | Source: Home / Self Care | Attending: Internal Medicine

## 2021-01-21 DIAGNOSIS — J9811 Atelectasis: Secondary | ICD-10-CM | POA: Diagnosis present

## 2021-01-21 DIAGNOSIS — Z833 Family history of diabetes mellitus: Secondary | ICD-10-CM | POA: Diagnosis not present

## 2021-01-21 DIAGNOSIS — Z79890 Hormone replacement therapy (postmenopausal): Secondary | ICD-10-CM | POA: Diagnosis not present

## 2021-01-21 DIAGNOSIS — K219 Gastro-esophageal reflux disease without esophagitis: Secondary | ICD-10-CM

## 2021-01-21 DIAGNOSIS — Z20822 Contact with and (suspected) exposure to covid-19: Secondary | ICD-10-CM | POA: Diagnosis present

## 2021-01-21 DIAGNOSIS — D500 Iron deficiency anemia secondary to blood loss (chronic): Secondary | ICD-10-CM | POA: Diagnosis present

## 2021-01-21 DIAGNOSIS — D649 Anemia, unspecified: Secondary | ICD-10-CM | POA: Diagnosis present

## 2021-01-21 DIAGNOSIS — Z87891 Personal history of nicotine dependence: Secondary | ICD-10-CM | POA: Diagnosis not present

## 2021-01-21 DIAGNOSIS — Z841 Family history of disorders of kidney and ureter: Secondary | ICD-10-CM | POA: Diagnosis not present

## 2021-01-21 DIAGNOSIS — J449 Chronic obstructive pulmonary disease, unspecified: Secondary | ICD-10-CM

## 2021-01-21 DIAGNOSIS — Z888 Allergy status to other drugs, medicaments and biological substances status: Secondary | ICD-10-CM | POA: Diagnosis not present

## 2021-01-21 DIAGNOSIS — K449 Diaphragmatic hernia without obstruction or gangrene: Secondary | ICD-10-CM | POA: Diagnosis present

## 2021-01-21 DIAGNOSIS — D509 Iron deficiency anemia, unspecified: Secondary | ICD-10-CM

## 2021-01-21 DIAGNOSIS — I10 Essential (primary) hypertension: Secondary | ICD-10-CM

## 2021-01-21 DIAGNOSIS — R911 Solitary pulmonary nodule: Secondary | ICD-10-CM | POA: Diagnosis not present

## 2021-01-21 DIAGNOSIS — Z79899 Other long term (current) drug therapy: Secondary | ICD-10-CM | POA: Diagnosis not present

## 2021-01-21 DIAGNOSIS — I959 Hypotension, unspecified: Secondary | ICD-10-CM | POA: Diagnosis present

## 2021-01-21 DIAGNOSIS — F32A Depression, unspecified: Secondary | ICD-10-CM | POA: Diagnosis present

## 2021-01-21 DIAGNOSIS — E119 Type 2 diabetes mellitus without complications: Secondary | ICD-10-CM | POA: Diagnosis present

## 2021-01-21 DIAGNOSIS — K5731 Diverticulosis of large intestine without perforation or abscess with bleeding: Secondary | ICD-10-CM | POA: Diagnosis present

## 2021-01-21 DIAGNOSIS — E538 Deficiency of other specified B group vitamins: Secondary | ICD-10-CM | POA: Diagnosis present

## 2021-01-21 DIAGNOSIS — Z6841 Body Mass Index (BMI) 40.0 and over, adult: Secondary | ICD-10-CM | POA: Diagnosis not present

## 2021-01-21 DIAGNOSIS — Z8249 Family history of ischemic heart disease and other diseases of the circulatory system: Secondary | ICD-10-CM | POA: Diagnosis not present

## 2021-01-21 DIAGNOSIS — G2581 Restless legs syndrome: Secondary | ICD-10-CM | POA: Diagnosis present

## 2021-01-21 DIAGNOSIS — E039 Hypothyroidism, unspecified: Secondary | ICD-10-CM | POA: Diagnosis present

## 2021-01-21 DIAGNOSIS — J9601 Acute respiratory failure with hypoxia: Secondary | ICD-10-CM | POA: Diagnosis present

## 2021-01-21 DIAGNOSIS — K648 Other hemorrhoids: Secondary | ICD-10-CM | POA: Diagnosis present

## 2021-01-21 HISTORY — PX: ESOPHAGOGASTRODUODENOSCOPY (EGD) WITH PROPOFOL: SHX5813

## 2021-01-21 HISTORY — PX: COLONOSCOPY WITH PROPOFOL: SHX5780

## 2021-01-21 LAB — TYPE AND SCREEN
ABO/RH(D): A POS
Antibody Screen: NEGATIVE
Unit division: 0
Unit division: 0

## 2021-01-21 LAB — BASIC METABOLIC PANEL
Anion gap: 8 (ref 5–15)
BUN: 8 mg/dL (ref 8–23)
CO2: 30 mmol/L (ref 22–32)
Calcium: 8.8 mg/dL — ABNORMAL LOW (ref 8.9–10.3)
Chloride: 100 mmol/L (ref 98–111)
Creatinine, Ser: 0.67 mg/dL (ref 0.44–1.00)
GFR, Estimated: >60 mL/min (ref >60)
Glucose, Bld: 104 mg/dL — ABNORMAL HIGH (ref 70–99)
Potassium: 3.6 mmol/L (ref 3.5–5.1)
Sodium: 138 mmol/L (ref 135–145)

## 2021-01-21 LAB — CBC
HCT: 30.2 % — ABNORMAL LOW (ref 36.0–46.0)
HCT: 31.2 % — ABNORMAL LOW (ref 36.0–46.0)
HCT: 31.9 % — ABNORMAL LOW (ref 36.0–46.0)
Hemoglobin: 9.4 g/dL — ABNORMAL LOW (ref 12.0–15.0)
Hemoglobin: 9.7 g/dL — ABNORMAL LOW (ref 12.0–15.0)
Hemoglobin: 9.4 g/dL — ABNORMAL LOW (ref 12.0–15.0)
MCH: 26.4 pg (ref 26.0–34.0)
MCH: 26.8 pg (ref 26.0–34.0)
MCH: 25.8 pg — ABNORMAL LOW (ref 26.0–34.0)
MCHC: 31.1 g/dL (ref 30.0–36.0)
MCHC: 31.1 g/dL (ref 30.0–36.0)
MCHC: 29.5 g/dL — ABNORMAL LOW (ref 30.0–36.0)
MCV: 84.8 fL (ref 80.0–100.0)
MCV: 86.2 fL (ref 80.0–100.0)
MCV: 87.4 fL (ref 80.0–100.0)
Platelets: 281 10*3/uL (ref 150–400)
Platelets: 261 10*3/uL (ref 150–400)
Platelets: 248 10*3/uL (ref 150–400)
RBC: 3.56 MIL/uL — ABNORMAL LOW (ref 3.87–5.11)
RBC: 3.62 MIL/uL — ABNORMAL LOW (ref 3.87–5.11)
RBC: 3.65 MIL/uL — ABNORMAL LOW (ref 3.87–5.11)
RDW: 17.1 % — ABNORMAL HIGH (ref 11.5–15.5)
RDW: 17.1 % — ABNORMAL HIGH (ref 11.5–15.5)
RDW: 17.8 % — ABNORMAL HIGH (ref 11.5–15.5)
WBC: 7.8 10*3/uL (ref 4.0–10.5)
WBC: 7.7 10*3/uL (ref 4.0–10.5)
WBC: 8.2 10*3/uL (ref 4.0–10.5)
nRBC: 0.6 % — ABNORMAL HIGH (ref 0.0–0.2)
nRBC: 0.8 % — ABNORMAL HIGH (ref 0.0–0.2)
nRBC: 1.1 % — ABNORMAL HIGH (ref 0.0–0.2)

## 2021-01-21 LAB — BPAM RBC
Blood Product Expiration Date: 202204202359
Blood Product Expiration Date: 202204202359
ISSUE DATE / TIME: 202203241704
ISSUE DATE / TIME: 202203242107
Unit Type and Rh: 6200
Unit Type and Rh: 6200

## 2021-01-21 SURGERY — ESOPHAGOGASTRODUODENOSCOPY (EGD) WITH PROPOFOL
Anesthesia: General

## 2021-01-21 MED ORDER — PROPOFOL 10 MG/ML IV BOLUS
INTRAVENOUS | Status: DC | PRN
  Administered 2021-01-21: 40 mg via INTRAVENOUS

## 2021-01-21 MED ORDER — PROPOFOL 500 MG/50ML IV EMUL
INTRAVENOUS | Status: DC | PRN
  Administered 2021-01-21: 140 ug/kg/min via INTRAVENOUS

## 2021-01-21 MED ORDER — LIDOCAINE HCL (PF) 2 % IJ SOLN
INTRAMUSCULAR | Status: DC | PRN
  Administered 2021-01-21: 60 mg via INTRADERMAL
  Administered 2021-01-21: 40 mg via INTRADERMAL

## 2021-01-21 MED ORDER — SODIUM CHLORIDE 0.9 % IV SOLN: INTRAVENOUS | Status: DC

## 2021-01-21 NOTE — Progress Notes (Signed)
Brief GI note  EGD revealed 8cm hiatal hernia, no active bleeding identified, no other bleeding source identified Colonoscopy was unremarkable except for sigmoid diverticulosis, copious amounts of liquid brown stool  Recs: Schedule video capsule endoscopy as outpatient Follow-up with [facility] Recommend referral to hematology for parenteral iron therapy Recommend antireflux lifestyle, indefinitely Recommend Prilosec 40 mg twice daily, indefinitely due to presence of large hiatal hernia Resume diet, patient can go home today from GI standpoint  Cephas Darby, MD [address]  [place], [place] [address]  Main: [phone]  Fax: [phone] Pager: [phone]

## 2021-01-21 NOTE — Hospital Course (Addendum)
[name] is a 75 y.o. female with medical history significant of HTN, COPD, GERD, hypothyroidism, depression, iron deficiency anemia, degenerative disc disease, RLS, who presented to the ED on 01/20/21, referred by her PCP due to finding of low hemoglobin on recent labs.  Patient was otherwise asymptomatic without melena, hemaochezia or other sign of blood loss.  She notably has history of iron deficiency anemia requiring iron transfusions.  Admitted and was transfused 2 units pRBC's, in addition to iron infusion.  GI was consulted and patient underwent EGD and colonoscopy which were unremarkable, no bleeding source was identified.    Patient is generally very weak.  PT recommended SNF for short term rehab prior to returning home.

## 2021-01-21 NOTE — Evaluation (Signed)
Physical Therapy Evaluation Patient Details Name: [name] MRN: [id number] DOB: 09-15-1946 Today's Date: 01/21/2021   History of Present Illness  Pt is a 75 y.o. female with medical history significant of HTN, COPD, GERD, hypothyroidism, depression, iron deficiency anemia, degenerative disc disease, RLS, who presents with abnormal lab with low hemoglobin.  MD assessment includes: 8cm hiatal hernia, sigmoid diverticulosis, blood loss anemia, and iron deficiency anemia.    Clinical Impression  Pt was pleasant but somewhat lethargic during the session although pt became more alert as the session progressed.  Pt found on 4LO2/min with SpO2 in the low 90s and remained in the low 90s throughout the session.  Pt required min-mod A with bed mobility tasks and min A to come to standing from an elevated surface.  Pt was able to amb a max of 5' before needing to return to sitting.  After a seated therapeutic rest break the pt ambulated a second time again a max of 5' but this time with more effortful cadence and with significant trunk flexion and lean on the RW for support.  Pt will benefit from PT services in a SNF setting upon discharge to safely address deficits listed in patient problem list for decreased caregiver assistance and eventual return to PLOF.     Follow Up Recommendations SNF;Supervision for mobility/OOB    Equipment Recommendations  None recommended by PT    Recommendations for Other Services       Precautions / Restrictions Precautions Precautions: Fall Restrictions Weight Bearing Restrictions: No      Mobility  Bed Mobility Overal bed mobility: Needs Assistance Bed Mobility: Supine to Sit     Supine to sit: Min assist;Mod assist     General bed mobility comments: Min to mod A for BLE and trunk control    Transfers Overall transfer level: Needs assistance Equipment used: Rolling walker (2 wheeled) Transfers: Sit to/from Stand Sit to Stand: Min assist;From  elevated surface         General transfer comment: Mod cues for hand placement and min A to come to standing from an elevated surface  Ambulation/Gait Ambulation/Gait assistance: Min guard Gait Distance (Feet): 5 Feet Assistive device: Rolling walker (2 wheeled) Gait Pattern/deviations: Step-through pattern;Decreased step length - right;Decreased step length - left;Wide base of support Gait velocity: decreased   General Gait Details: Pt able to amb a max of 5 feet x 2 with seated rest break between walks and with pt leaning heavily on the RW during the second walk with extensive trunk flexion; SpO2 in the low 90s during the session on 4LO2/min  Stairs            Wheelchair Mobility    Modified Rankin (Stroke Patients Only)       Balance Overall balance assessment: Needs assistance   Sitting balance-Leahy Scale: Fair     Standing balance support: Bilateral upper extremity supported;During functional activity Standing balance-Leahy Scale: Fair Standing balance comment: Heavy lean on the RW for support                             Pertinent Vitals/Pain Pain Assessment: No/denies pain    Home Living Family/patient expects to be discharged to:: Private residence Living Arrangements: Other (Comment) (Live-in caregiver) Available Help at Discharge: Personal care attendant;Available 24 hours/day Type of Home: Mobile home Home Access: Ramped entrance     Home Layout: One level Home Equipment: [place] - 4 wheels;Toilet riser;Tub bench;Grab  bars - toilet;Walker - 2 wheels      Prior Function Level of Independence: Needs assistance   Gait / Transfers Assistance Needed: HH ambulator with a rollator, at least one fall in the last 6 months secondary to tripping  ADL's / Homemaking Assistance Needed: Assist from PCA with ADLs/IADLs        Hand Dominance   Dominant Hand: Right    Extremity/Trunk Assessment   Upper Extremity Assessment Upper Extremity  Assessment: Generalized weakness    Lower Extremity Assessment Lower Extremity Assessment: Generalized weakness       Communication   Communication: No difficulties  Cognition Arousal/Alertness: Lethargic Behavior During Therapy: Flat affect Overall Cognitive Status: Within Functional Limits for tasks assessed                                        General Comments      Exercises Total Joint Exercises Ankle Circles/Pumps: AROM;Strengthening;Both;10 reps Quad Sets: 10 reps;Strengthening;Both Gluteal Sets: 10 reps;Both;Strengthening Heel Slides: AROM;Strengthening;Both;5 reps Hip ABduction/ADduction: Strengthening;Both;5 reps;AROM Long Arc Quad: AROM;Strengthening;Both;10 reps Knee Flexion: 10 reps;Both;Strengthening;AROM Marching in Standing: AROM;Strengthening;Both;5 reps;Standing Other Exercises Other Exercises: HEP education for BLE APs, QS, GS, and LAQs x 10 each every 1-2 hours daily   Assessment/Plan    PT Assessment Patient needs continued PT services  PT Problem List Decreased strength;Decreased activity tolerance;Decreased balance;Decreased mobility;Decreased knowledge of use of DME       PT Treatment Interventions DME instruction;Gait training;Functional mobility training;Therapeutic activities;Balance training;Therapeutic exercise;Patient/family education    PT Goals (Current goals can be found in the Care Plan section)  Acute Rehab PT Goals Patient Stated Goal: To be able to walk better PT Goal Formulation: With patient Time For Goal Achievement: 02/03/21 Potential to Achieve Goals: Fair    Frequency Min 2X/week   Barriers to discharge Inaccessible home environment      Co-evaluation               AM-PAC PT "6 Clicks" Mobility  Outcome Measure Help needed turning from your back to your side while in a flat bed without using bedrails?: A Little Help needed moving from lying on your back to sitting on the side of a flat bed  without using bedrails?: A Lot Help needed moving to and from a bed to a chair (including a wheelchair)?: A Lot Help needed standing up from a chair using your arms (e.g., wheelchair or bedside chair)?: A Little Help needed to walk in hospital room?: A Lot Help needed climbing 3-5 steps with a railing? : Total 6 Click Score: 13    End of Session Equipment Utilized During Treatment: Gait belt Activity Tolerance: Patient tolerated treatment well Patient left: in chair;with call bell/phone within reach;with chair alarm set Nurse Communication: Mobility status PT Visit Diagnosis: Unsteadiness on feet (R26.81);History of falling (Z91.81);Muscle weakness (generalized) (M62.81);Difficulty in walking, not elsewhere classified (R26.2)    Time: 7867-6720 PT Time Calculation (min) (ACUTE ONLY): 42 min   Charges:   PT Evaluation $PT Eval Moderate Complexity: 1 Mod PT Treatments $Therapeutic Exercise: 8-22 mins        D. Royetta Asal PT, DPT 01/21/21, 3:19 PM

## 2021-01-21 NOTE — Plan of Care (Signed)
  Problem: Education: Goal: Knowledge of General Education information will improve Description: Including pain rating scale, medication(s)/side effects and non-pharmacologic comfort measures 01/21/2021 1600 by Cristela Blue, RN Outcome: Progressing 01/21/2021 1600 by Cristela Blue, RN Outcome: Progressing   Problem: Health Behavior/Discharge Planning: Goal: Ability to manage health-related needs will improve 01/21/2021 1600 by Cristela Blue, RN Outcome: Progressing 01/21/2021 1600 by Cristela Blue, RN Outcome: Progressing   Problem: Clinical Measurements: Goal: Ability to maintain clinical measurements within normal limits will improve 01/21/2021 1600 by Cristela Blue, RN Outcome: Progressing 01/21/2021 1600 by Cristela Blue, RN Outcome: Progressing Goal: Will remain free from infection 01/21/2021 1600 by Cristela Blue, RN Outcome: Progressing 01/21/2021 1600 by Cristela Blue, RN Outcome: Progressing Goal: Diagnostic test results will improve 01/21/2021 1600 by Cristela Blue, RN Outcome: Progressing 01/21/2021 1600 by Cristela Blue, RN Outcome: Progressing Goal: Respiratory complications will improve 01/21/2021 1600 by Cristela Blue, RN Outcome: Progressing 01/21/2021 1600 by Cristela Blue, RN Outcome: Progressing Goal: Cardiovascular complication will be avoided 01/21/2021 1600 by Cristela Blue, RN Outcome: Progressing 01/21/2021 1600 by Cristela Blue, RN Outcome: Progressing   Problem: Activity: Goal: Risk for activity intolerance will decrease 01/21/2021 1600 by Cristela Blue, RN Outcome: Progressing 01/21/2021 1600 by Cristela Blue, RN Outcome: Progressing   Problem: Nutrition: Goal: Adequate nutrition will be maintained 01/21/2021 1600 by Cristela Blue, RN Outcome: Progressing 01/21/2021 1600 by Cristela Blue, RN Outcome: Progressing   Problem: Coping: Goal: Level of anxiety will decrease 01/21/2021 1600 by Cristela Blue, RN Outcome:  Progressing 01/21/2021 1600 by Cristela Blue, RN Outcome: Progressing   Problem: Elimination: Goal: Will not experience complications related to bowel motility 01/21/2021 1600 by Cristela Blue, RN Outcome: Progressing 01/21/2021 1600 by Cristela Blue, RN Outcome: Progressing Goal: Will not experience complications related to urinary retention 01/21/2021 1600 by Cristela Blue, RN Outcome: Progressing 01/21/2021 1600 by Cristela Blue, RN Outcome: Progressing   Problem: Pain Managment: Goal: General experience of comfort will improve 01/21/2021 1600 by Cristela Blue, RN Outcome: Progressing 01/21/2021 1600 by Cristela Blue, RN Outcome: Progressing   Problem: Safety: Goal: Ability to remain free from injury will improve 01/21/2021 1600 by Cristela Blue, RN Outcome: Progressing 01/21/2021 1600 by Cristela Blue, RN Outcome: Progressing   Problem: Skin Integrity: Goal: Risk for impaired skin integrity will decrease 01/21/2021 1600 by Cristela Blue, RN Outcome: Progressing 01/21/2021 1600 by Cristela Blue, RN Outcome: Progressing

## 2021-01-21 NOTE — TOC Initial Note (Signed)
Transition of Care [facility]) - Initial/Assessment Note    Patient Details  Name: [name] MRN: [id number] Date of Birth: [birth year]  Transition of Care [facility]) CM/SW Contact:    Beverly Sessions, RN Phone Number: 01/21/2021, 4:08 PM  Clinical Narrative:                  Patient admitted from home with anemia Patient lives at home.  She does have a roommate that is there 24/7 who helps cook for her and take her to the bathroom.   Daughter at bedside who lives locally  Patient has rollator, toilet riser, tub bench and RW in the home  PCP Edwina Barth - family takes to appointments Denies issues obtaining medications  PT has assessed patient and recommends SNF.  My self and daughter discussed options in detail with the patient. She is not interested in SNF, but agreeable to home health.  Both  Daughter and patient states they do not have a preference of home health agency.  Referral made to [facility] with [facility].  Per Tommi Rumps start of care will be Monday or Tues.  Patient and MD notified   Expected Discharge Plan: [place] Barriers to Discharge: Continued Medical Work up   Patient Goals and CMS Choice     Choice offered to / list presented to : [name]  Expected Discharge Plan and Services Expected Discharge Plan: [place]   Discharge Planning Services: CM Consult Post Acute Care Choice: [place] arrangements for the past 2 months: Single Family Home                           HH Arranged: PT,OT,Nurse's Aide          Prior Living Arrangements/Services Living arrangements for the past 2 months: Single Family Home Lives with:: Roommate Patient language and need for interpreter reviewed:: Yes Do you feel safe going back to the place where you live?: Yes      Need for Family Participation in Patient Care: Yes (Comment) Care giver support system in place?: Yes (comment) Current home services: DME Criminal  Activity/Legal Involvement Pertinent to Current Situation/Hospitalization: No - Comment as needed  Activities of Daily Living Home Assistive Devices/Equipment: Cane (specify quad or straight) ADL Screening (condition at time of admission) Patient's cognitive ability adequate to safely complete daily activities?: Yes Is the patient deaf or have difficulty hearing?: No Does the patient have difficulty seeing, even when wearing glasses/contacts?: No Does the patient have difficulty concentrating, remembering, or making decisions?: No Patient able to express need for assistance with ADLs?: Yes Does the patient have difficulty dressing or bathing?: No Independently performs ADLs?: Yes (appropriate for developmental age) Does the patient have difficulty walking or climbing stairs?: No Weakness of Legs: Both Weakness of Arms/Hands: None  Permission Sought/Granted                  Emotional Assessment       Orientation: : Oriented to Self,Oriented to Place,Oriented to  Time,Oriented to Situation Alcohol / Substance Use: Not Applicable Psych Involvement: No (comment)  Admission diagnosis:  Blood loss anemia [D50.0] Symptomatic anemia [D64.9] Patient Active Problem List   Diagnosis Date Noted  . Symptomatic anemia 01/21/2021  . Blood loss anemia 01/20/2021  . GI bleeding 01/20/2021  . GERD (gastroesophageal reflux disease) 01/20/2021  . Hypothyroidism 01/20/2021  . Depression 01/20/2021  . COPD (chronic obstructive pulmonary  disease) ([place]) 01/20/2021  . History of recent fall 07/30/2020  . Fracture of one rib, left side, initial encounter for closed fracture 07/30/2020  . Hypoxia 07/30/2020  . Chest pain, atypical 07/30/2020  . COPD exacerbation ([place]) 07/30/2020  . Community acquired pneumonia 03/20/2020  . Type 2 diabetes mellitus without complication ([place]) 38/25/0539  . Obesity, Class III, BMI 40-49.9 (morbid obesity) ([place]) 03/20/2020  . Acute respiratory failure with  hypoxia ([place]) 03/19/2020  . Hyponatremia 04/27/2019  . Hallucinations, visual 04/27/2019  . Iron deficiency anemia 02/01/2018  . Acute exacerbation of chronic obstructive pulmonary disease (COPD) ([place]) 11/05/2016  . Spinal stenosis, lumbar region, with neurogenic claudication 07/08/2015  . Sacroiliac joint dysfunction 06/28/2015  . Greater trochanteric bursitis 04/10/2015  . Sacroiliac joint disease 03/14/2015  . Facet syndrome, lumbar 03/14/2015  . HTN (hypertension) 03/06/2015  . Thyroid disease 03/06/2015  . DDD (degenerative disc disease), cervical 03/04/2015  . DDD (degenerative disc disease), lumbar 03/04/2015  . DJD (degenerative joint disease) of knee 03/04/2015   PCP:  Baxter Hire, MD Pharmacy:   [facility] [address]), [place] [address] [place]) [place] [address] Phone: [phone] Fax: [phone]     Social Determinants of Health (SDOH) Interventions    Readmission Risk Interventions No flowsheet data found.

## 2021-01-21 NOTE — Progress Notes (Signed)
Patient drank about 2/3 of polyethylene glycol powder G2 and all of magesium citrate. Has had multiple bowel movements during the night. LBM at 4:30 was still brown but liquidly. Encouraged patient to drink more bowel prep but she said she had enough and was tired.

## 2021-01-21 NOTE — Op Note (Signed)
[facility] Patient Name: [name] Procedure Date: 01/21/2021 12:19 PM MRN: [id number] Account #: [id number] Date of Birth: 1946-06-05 Admit Type: Inpatient Age: 75 Room: [facility] ENDO ROOM 1 Gender: Female Note Status: Finalized Procedure:             Upper GI endoscopy Indications:           Unexplained iron deficiency anemia Providers:             Lin Landsman MD, MD Referring MD:          Baxter Hire, MD (Referring MD) Medicines:             General Anesthesia Complications:         No immediate complications. Estimated blood loss: None. Procedure:             Pre-Anesthesia Assessment:                        - Prior to the procedure, a History and Physical was                         performed, and patient medications and allergies were                         reviewed. The patient is competent. The risks and                         benefits of the procedure and the sedation options and                         risks were discussed with the patient. All questions                         were answered and informed consent was obtained.                         Patient identification and proposed procedure were                         verified by the physician, the nurse, the                         anesthesiologist, the anesthetist and the technician                         in the pre-procedure area in the procedure room in the                         endoscopy suite. Mental Status Examination: alert and                         oriented. Airway Examination: normal oropharyngeal                         airway and neck mobility. Respiratory Examination:                         clear to auscultation. CV Examination: normal.  Prophylactic Antibiotics: The patient does not require                         prophylactic antibiotics. Prior Anticoagulants: The                         patient has taken no previous  anticoagulant or                         antiplatelet agents. ASA Grade Assessment: III - A                         patient with severe systemic disease. After reviewing                         the risks and benefits, the patient was deemed in                         satisfactory condition to undergo the procedure. The                         anesthesia plan was to use general anesthesia.                         Immediately prior to administration of medications,                         the patient was re-assessed for adequacy to receive                         sedatives. The heart rate, respiratory rate, oxygen                         saturations, blood pressure, adequacy of pulmonary                         ventilation, and response to care were monitored                         throughout the procedure. The physical status of the                         patient was re-assessed after the procedure.                        After obtaining informed consent, the endoscope was                         passed under direct vision. Throughout the procedure,                         the patient's blood pressure, pulse, and oxygen                         saturations were monitored continuously. The Endoscope                         was introduced through the mouth, and advanced to the  second part of duodenum. The upper GI endoscopy was                         accomplished without difficulty. The patient tolerated                         the procedure well. Findings:      The duodenal bulb and second portion of the duodenum were normal.      The entire examined stomach was normal.      An 8 cm hiatal hernia was found. The proximal extent of the gastric       folds (end of tubular esophagus) was 30 cm from the incisors. The hiatal       narrowing was 38 cm from the incisors. The Z-line was 30 cm from the       incisors. Estimated blood loss: none.      The Z-line was regular  and was found 30 cm from the incisors.      The gastroesophageal junction and examined esophagus were normal. Impression:            - Normal duodenal bulb and second portion of the                         duodenum.                        - Normal stomach.                        - 8 cm hiatal hernia.                        - Z-line regular, 30 cm from the incisors.                        - Normal gastroesophageal junction and esophagus.                        - No specimens collected. Recommendation:        - Follow an antireflux regimen indefinitely.                        - Use Prilosec (omeprazole) 40 mg PO BID indefinitely.                        - Proceed with colonoscopy as scheduled                        See colonoscopy report Procedure Code(s):     --- Professional ---                        [phone], Esophagogastroduodenoscopy, flexible,                         transoral; diagnostic, including collection of                         specimen(s) by brushing or washing, when performed                         (  separate procedure) Diagnosis Code(s):     --- Professional ---                        K44.9, Diaphragmatic hernia without obstruction or                         gangrene                        D50.9, Iron deficiency anemia, unspecified CPT copyright 2019 American Medical Association. All rights reserved. The codes documented in this report are preliminary and upon coder review may  be revised to meet current compliance requirements. Dr. Ulyess Mort Lin Landsman MD, MD 01/21/2021 12:44:50 PM This report has been signed electronically. Number of Addenda: 0 Note Initiated On: 01/21/2021 12:19 PM Estimated Blood Loss:  Estimated blood loss: none.      [facility]

## 2021-01-21 NOTE — Anesthesia Preprocedure Evaluation (Signed)
Anesthesia Evaluation  Patient identified by MRN, date of birth, ID band Patient awake    Reviewed: Allergy & Precautions, H&P , NPO status , Patient's Chart, lab work & pertinent test results, reviewed documented beta blocker date and time   History of Anesthesia Complications Negative for: history of anesthetic complications  Airway Mallampati: III  TM Distance: >3 FB Neck ROM: full    Dental  (+) Edentulous Upper, Dental Advidsory Given, Upper Dentures   Pulmonary shortness of breath and with exertion, asthma , neg sleep apnea, COPD,  COPD inhaler, neg recent URI, former smoker,           Cardiovascular Exercise Tolerance: Good hypertension, (-) angina(-) CAD, (-) Past MI, (-) Cardiac Stents and (-) CABG (-) dysrhythmias + Valvular Problems/Murmurs      Neuro/Psych PSYCHIATRIC DISORDERS Depression negative neurological ROS     GI/Hepatic Neg liver ROS, GERD  ,  Endo/Other  neg diabetesHypothyroidism Morbid obesity  Renal/GU negative Renal ROS  negative genitourinary   Musculoskeletal   Abdominal   Peds  Hematology negative hematology ROS (+)   Anesthesia Other Findings Past Medical History: No date: Allergy No date: Asthma No date: Back pain No date: Bronchitis No date: COPD (chronic obstructive pulmonary disease) (HCC) No date: Degenerative disc disease, lumbar No date: Depression No date: FHx: cholecystectomy No date: H/O: hysterectomy No date: Headache No date: Hypertension No date: Murmur 03/31/16: Pneumonia     Comment:  being treated by PCP No date: Restless legs syndrome No date: Scoliosis No date: Shingles No date: Thyroid disease   Reproductive/Obstetrics negative OB ROS                             Anesthesia Physical  Anesthesia Plan  ASA: III  Anesthesia Plan: General   Post-op Pain Management:    Induction: Intravenous  PONV Risk Score and Plan: 3 and  Propofol infusion and TIVA  Airway Management Planned: Natural Airway and Nasal Cannula  Additional Equipment:   Intra-op Plan:   Post-operative Plan:   Informed Consent: I have reviewed the patients History and Physical, chart, labs and discussed the procedure including the risks, benefits and alternatives for the proposed anesthesia with the patient or authorized representative who has indicated his/her understanding and acceptance.     Dental Advisory Given  Plan Discussed with: Anesthesiologist, CRNA and Surgeon  Anesthesia Plan Comments:         Anesthesia Quick Evaluation

## 2021-01-21 NOTE — Progress Notes (Signed)
PROGRESS NOTE    [name]   ELF:[id number]  DOB: 1946-10-13  PCP: Baxter Hire, MD    DOA: 01/20/2021 LOS: 0   Brief Narrative   [name] is a 75 y.o. female with medical history significant of HTN, COPD, GERD, hypothyroidism, depression, iron deficiency anemia, degenerative disc disease, RLS, who presented to the ED on 01/20/21, referred by her PCP due to finding of low hemoglobin on recent labs.  Patient was otherwise asymptomatic without melena, hemaochezia or other sign of blood loss.  She notably has history of iron deficiency anemia requiring iron transfusions.  Admitted and was transfused 2 units pRBC's, in addition to iron infusion.  GI was consulted and patient underwent EGD and colonoscopy which were unremarkable, no bleeding source was identified.    Patient is generally very weak.  PT to evaluate.   Assessment & Plan   Principal Problem:   Blood loss anemia Active Problems:   HTN (hypertension)   Iron deficiency anemia   GI bleeding   GERD (gastroesophageal reflux disease)   Hypothyroidism   Depression   COPD (chronic obstructive pulmonary disease) (HCC)   Symptomatic anemia   Symptomatic Anemia / History of Iron Deficiency / ?GI Bleeding -  GI consulted appreciate recommendations: --Follow antireflux diet indefinitely --Prilosec 40 mg p.o. twice daily --Outpatient GI follow-up for capsule endoscopy --Continue monitor hemoglobin, transfuse if less than 7.0  Hypotension -etiology not clear.  Seems unlikely related to sedation from procedures as BP was low overnight as well.  Afebrile and no leukocytosis or symptoms of infection.  Antihypertensives are held and BPs have been soft today.  Continue holding antihypertensives. --Maintain MAP at or above 65 with IV fluids if needed  Altered mental status -possibly from sedation due to procedures earlier today, which should improve with time.  Daughter this afternoon reporting patient's speech is abnormal  with words in the wrong order and slow word finding.  Says this is very atypical. --Neurochecks --CT head is pending --MRI brain if CT is negative and symptoms persist  Generalized weakness -nursing note patient to be very unsteady on her feet this morning.  PT evaluated and recommended SNF.  Patient declines but is excepting of home health.  TOC following. --Fall precautions  History of hypertension -currently hypotensive as above.  Continue holding Lasix, amlodipine, metoprolol.  GERD -on IV PPI.  Will discharge on omeprazole 40 mg p.o. twice daily per GI  Hypothyroidism -continue Synthroid  Depression -continue home meds  COPD -seems overall stable.  Patient endorses chronic wheezing on and off.  Will monitor.  Continue bronchodilators.  Obesity: Body mass index is 41.79 kg/m.   Complicates overall care and prognosis.  Recommend lifestyle modifications including physical activity and diet for weight loss and overall long-term health.   DVT prophylaxis: SCDs Start: 01/20/21 1439   Diet:  Diet Orders (From admission, onward)    Start     Ordered   01/21/21 1308  Diet heart healthy/carb modified Room service appropriate? Yes; Fluid consistency: Thin  Diet effective now       Question Answer Comment  Diet-HS Snack? Nothing   Room service appropriate? Yes   Fluid consistency: Thin      01/21/21 1307            Code Status: Full Code    Subjective 01/21/21    Patient seen at bedside this morning before going to endoscopy.  She says she has chronic mild abdominal pain that comes and  goes.  Has not been any worse than recently.  Never noticed any blood in her stool.  Reports she frequently has wheezing with her COPD but not worse than usual recently.  No other acute complaints.   Disposition Plan & Communication   Status is: Inpatient  Inpatient status appropriate due to severity of illness.  Requires further monitoring given transfusion required in the past 24 hours.   Also hypotensive with BP medications held warranting further monitoring evaluation.  Dispo: The patient is from: Home              Anticipated d/c is to: Home              Patient currently is not medically stable for discharge   Difficult to place patient no        Family Communication: Spoke with daughter, Angela Nevin [phone]), by phone this afternoon.  She was updated on findings of endoscopy, blood pressure, PT evaluation recommendations.   Consults, Procedures, Significant Events   Consultants:   GI  Procedures:  EGD - Impression:      - Normal duodenal bulb and second portion of the duodenum. - Normal stomach. - 8 cm hiatal hernia. - Z-line regular, 30 cm from the incisors. - Normal gastroesophageal junction and esophagus. - No specimens collected.  Colonoscopy - Impression:    - Preparation of the colon was poor. - Normal mucosa in the entire examined colon. - Stool in the entire examined colon. - The distal rectum and anal verge are normal on retroflexion view. - Diverticulosis in the sigmoid colon. - No specimens collected.  Antimicrobials:  Anti-infectives (From admission, onward)   None        Micro    Objective   Vitals:   01/21/21 1300 01/21/21 1310 01/21/21 1320 01/21/21 1337  BP: 113/79 104/76 (!) 95/49 97/66  Pulse: 92 93 89 93  Resp: 16 20 20 17   Temp: 98.5 F (36.9 C)   98.6 F (37 C)  TempSrc: Temporal   Oral  SpO2: 95% 98% 95% 92%  Weight:      Height:        Intake/Output Summary (Last 24 hours) at 01/21/2021 1706 Last data filed at 01/21/2021 1258 Gross per 24 hour  Intake 2152.05 ml  Output 1 ml  Net 2151.05 ml   Filed Weights   01/20/21 1315 01/21/21 1130  Weight: 90.7 kg 90.7 kg    Physical Exam:  General exam: awake, alert, no acute distress HEENT: moist mucus membranes, hearing grossly normal  Respiratory system: Overall diminished but clear, no wheezes, rales or rhonchi, normal respiratory effort, on 2 L/min  oxygen. Cardiovascular system: normal S1/S2, RRR, no pedal edema.   Gastrointestinal system: soft, NT, ND, no HSM felt, +bowel sounds. Central nervous system: no gross focal neurologic deficits, normal speech Extremities: moves all, no edema, normal tone Skin: dry, intact, normal temperature, normal color Psychiatry: normal mood, congruent affect, judgement and insight appear normal  Labs   Data Reviewed: I have personally reviewed following labs and imaging studies  CBC: Recent Labs  Lab 01/20/21 1325 01/20/21 1444 01/21/21 0526 01/21/21 1006 01/21/21 1416  WBC 6.8 6.2 7.8 7.7 8.2  NEUTROABS 4.8  --   --   --   --   HGB 6.5* 6.3* 9.4* 9.7* 9.4*  HCT 22.7* 21.5* 30.2* 31.2* 31.9*  MCV 82.5 82.4 84.8 86.2 87.4  PLT 322 306 281 261 338   Basic Metabolic Panel: Recent Labs  Lab 01/20/21 1325  01/21/21 0526  NA 137 138  K 4.1 3.6  CL 99 100  CO2 29 30  GLUCOSE 115* 104*  BUN 12 8  CREATININE 0.80 0.67  CALCIUM 8.9 8.8*   GFR: Estimated Creatinine Clearance: 60.1 mL/min (by C-G formula based on SCr of 0.67 mg/dL). Liver Function Tests: Recent Labs  Lab 01/20/21 1325  AST 16  ALT 10  ALKPHOS 71  BILITOT 0.5  PROT 6.4*  ALBUMIN 3.1*   No results for input(s): LIPASE, AMYLASE in the last 168 hours. No results for input(s): AMMONIA in the last 168 hours. Coagulation Profile: Recent Labs  Lab 01/20/21 1444  INR 1.1   Cardiac Enzymes: No results for input(s): CKTOTAL, CKMB, CKMBINDEX, TROPONINI in the last 168 hours. BNP (last 3 results) No results for input(s): PROBNP in the last 8760 hours. HbA1C: No results for input(s): HGBA1C in the last 72 hours. CBG: No results for input(s): GLUCAP in the last 168 hours. Lipid Profile: No results for input(s): CHOL, HDL, LDLCALC, TRIG, CHOLHDL, LDLDIRECT in the last 72 hours. Thyroid Function Tests: No results for input(s): TSH, T4TOTAL, FREET4, T3FREE, THYROIDAB in the last 72 hours. Anemia Panel: Recent Labs     01/20/21 1325 01/20/21 1425  VITAMINB12  --  249  FOLATE 5.3*  --   FERRITIN 4*  --   TIBC 500*  --   IRON 12*  --   RETICCTPCT 2.1  --    Sepsis Labs: No results for input(s): PROCALCITON, LATICACIDVEN in the last 168 hours.  Recent Results (from the past 240 hour(s))  Resp Panel by RT-PCR (Flu A&B, Covid) Nasopharyngeal Swab     Status: None   Collection Time: 01/20/21  2:40 PM   Specimen: Nasopharyngeal Swab; Nasopharyngeal(NP) swabs in vial transport medium  Result Value Ref Range Status   SARS Coronavirus 2 by RT PCR NEGATIVE NEGATIVE Final    Comment: (NOTE) SARS-CoV-2 target nucleic acids are NOT DETECTED.  The SARS-CoV-2 RNA is generally detectable in upper respiratory specimens during the acute phase of infection. The lowest concentration of SARS-CoV-2 viral copies this assay can detect is 138 copies/mL. A negative result does not preclude SARS-Cov-2 infection and should not be used as the sole basis for treatment or other patient management decisions. A negative result may occur with  improper specimen collection/handling, submission of specimen other than nasopharyngeal swab, presence of viral mutation(s) within the areas targeted by this assay, and inadequate number of viral copies(<138 copies/mL). A negative result must be combined with clinical observations, patient history, and epidemiological information. The expected result is Negative.  Fact Sheet for Patients:  EntrepreneurPulse.com.au  Fact Sheet for Healthcare Providers:  IncredibleEmployment.be  This test is no t yet approved or cleared by the [place] FDA and  has been authorized for detection and/or diagnosis of SARS-CoV-2 by FDA under an Emergency Use Authorization (EUA). This EUA will remain  in effect (meaning this test can be used) for the duration of the COVID-19 declaration under Section 564(b)(1) of the Act, 21 U.S.C.section 360bbb-3(b)(1), unless the  authorization is terminated  or revoked sooner.       Influenza A by PCR NEGATIVE NEGATIVE Final   Influenza B by PCR NEGATIVE NEGATIVE Final    Comment: (NOTE) The Xpert Xpress SARS-CoV-2/FLU/RSV plus assay is intended as an aid in the diagnosis of influenza from Nasopharyngeal swab specimens and should not be used as a sole basis for treatment. Nasal washings and aspirates are unacceptable for Xpert Xpress SARS-CoV-2/FLU/RSV testing.  Fact Sheet for Patients: EntrepreneurPulse.com.au  Fact Sheet for Healthcare Providers: IncredibleEmployment.be  This test is not yet approved or cleared by the [place] FDA and has been authorized for detection and/or diagnosis of SARS-CoV-2 by FDA under an Emergency Use Authorization (EUA). This EUA will remain in effect (meaning this test can be used) for the duration of the COVID-19 declaration under Section 564(b)(1) of the Act, 21 U.S.C. section 360bbb-3(b)(1), unless the authorization is terminated or revoked.  Performed at [facility], [address] [place], [place] [address]       Imaging Studies   No results found.   Medications   Scheduled Meds: . cholecalciferol  2,000 Units Oral Daily  . citalopram  40 mg Oral Daily  . ferrous sulfate  325 mg Oral q morning  . folic acid  1 mg Oral Daily  . gabapentin  200 mg Oral QHS  . levothyroxine  112 mcg Oral QAC breakfast  . mometasone-formoterol  2 puff Inhalation BID  . multivitamin with minerals  1 tablet Oral Daily  . pantoprazole (PROTONIX) IV  40 mg Intravenous Q12H  . senna-docusate  1 tablet Oral QHS  . tiotropium  18 mcg Inhalation Daily  . traZODone  100 mg Oral QHS  . vitamin B-12  1,000 mcg Oral Daily   Continuous Infusions: . sodium chloride 10 mL/hr at 01/20/21 1731       LOS: 0 days    Time spent: 30 minutes    Ezekiel Slocumb, DO [facility]  01/21/2021, 5:06 PM      If 7PM-7AM,  please contact night-coverage. How to contact the [facility] Attending or Consulting provider [place] or covering provider during after hours [place], for this patient?    1. Check the care team in [facility] and look for a) attending/consulting [facility] provider listed and b) the [facility] team listed 2. Log into [email or web address] and use [facility]'s universal password to access. If you do not have the password, please contact the hospital operator. 3. Locate the [facility] provider you are looking for under [facility] and page to a number that you can be directly reached. 4. If you still have difficulty reaching the provider, please page the [facility] (Director on Call) for the Hospitalists listed on amion for assistance.

## 2021-01-21 NOTE — Transfer of Care (Signed)
Immediate Anesthesia Transfer of Care Note  Patient: [name]  Procedure(s) Performed: ESOPHAGOGASTRODUODENOSCOPY (EGD) WITH PROPOFOL (N/A ) COLONOSCOPY WITH PROPOFOL (N/A )  Patient Location: PACU  Anesthesia Type:General  Level of Consciousness: awake, alert  and oriented  Airway & Oxygen Therapy: Patient Spontanous Breathing and Patient connected to nasal cannula oxygen  Post-op Assessment: Report given to RN and Post -op Vital signs reviewed and stable  Post vital signs: Reviewed and stable  Last Vitals:  Vitals Value Taken Time  BP    Temp    Pulse    Resp    SpO2      Last Pain:  Vitals:   01/21/21 1130  TempSrc: Temporal  PainSc: 0-No pain         Complications: No complications documented.

## 2021-01-21 NOTE — Op Note (Signed)
[facility] Gastroenterology Patient Name: [name] Procedure Date: 01/21/2021 12:18 PM MRN: [id number] Account #: [id number] Date of Birth: Jul 15, 1946 Admit Type: Inpatient Age: 75 Room: [facility] ENDO ROOM 1 Gender: Female Note Status: Finalized Procedure:             Colonoscopy Indications:           Last colonoscopy: March 2019, Unexplained iron                         deficiency anemia Providers:             Lin Landsman MD, MD Referring MD:          Baxter Hire, MD (Referring MD) Medicines:             General Anesthesia Complications:         No immediate complications. Estimated blood loss: None. Procedure:             Pre-Anesthesia Assessment:                        - Prior to the procedure, a History and Physical was                         performed, and patient medications and allergies were                         reviewed. The patient is competent. The risks and                         benefits of the procedure and the sedation options and                         risks were discussed with the patient. All questions                         were answered and informed consent was obtained.                         Patient identification and proposed procedure were                         verified by the physician, the nurse, the                         anesthesiologist, the anesthetist and the technician                         in the pre-procedure area in the procedure room in the                         endoscopy suite. Mental Status Examination: alert and                         oriented. Airway Examination: normal oropharyngeal                         airway and neck mobility. Respiratory Examination:  clear to auscultation. CV Examination: normal.                         Prophylactic Antibiotics: The patient does not require                         prophylactic antibiotics. Prior Anticoagulants: The                          patient has taken no previous anticoagulant or                         antiplatelet agents. ASA Grade Assessment: III - A                         patient with severe systemic disease. After reviewing                         the risks and benefits, the patient was deemed in                         satisfactory condition to undergo the procedure. The                         anesthesia plan was to use general anesthesia.                         Immediately prior to administration of medications,                         the patient was re-assessed for adequacy to receive                         sedatives. The heart rate, respiratory rate, oxygen                         saturations, blood pressure, adequacy of pulmonary                         ventilation, and response to care were monitored                         throughout the procedure. The physical status of the                         patient was re-assessed after the procedure.                        After obtaining informed consent, the colonoscope was                         passed under direct vision. Throughout the procedure,                         the patient's blood pressure, pulse, and oxygen                         saturations were monitored continuously. The  Colonoscope was introduced through the anus and                         advanced to the the cecum, identified by appendiceal                         orifice and ileocecal valve. The colonoscopy was                         performed with moderate difficulty due to inadequate                         bowel prep, significant looping and the patient's body                         habitus. Successful completion of the procedure was                         aided by applying abdominal pressure. The patient                         tolerated the procedure well. The quality of the bowel                         preparation was poor. Findings:      The  perianal and digital rectal examinations were normal. Pertinent       negatives include normal sphincter tone and no palpable rectal lesions.      Normal mucosa was found in the entire colon.      Copious quantities of semi-liquid stool was found in the entire colon,       precluding visualization.      The retroflexed view of the distal rectum and anal verge was normal and       showed no anal or rectal abnormalities.      A few diverticula were found in the sigmoid colon. Impression:            - Preparation of the colon was poor.                        - Normal mucosa in the entire examined colon.                        - Stool in the entire examined colon.                        - The distal rectum and anal verge are normal on                         retroflexion view.                        - Diverticulosis in the sigmoid colon.                        - No specimens collected. Recommendation:        - Return patient to hospital ward for possible  discharge same day.                        - Resume previous diet today.                        - Continue present medications.                        - To visualize the small bowel, perform video capsule                         endoscopy at appointment to be scheduled. Procedure Code(s):     --- Professional ---                        [phone], Colonoscopy, flexible; diagnostic, including                         collection of specimen(s) by brushing or washing, when                         performed (separate procedure) Diagnosis Code(s):     --- Professional ---                        D50.9, Iron deficiency anemia, unspecified                        K57.30, Diverticulosis of large intestine without                         perforation or abscess without bleeding CPT copyright 2019 American Medical Association. All rights reserved. The codes documented in this report are preliminary and upon coder review may  be  revised to meet current compliance requirements. Dr. Ulyess Mort Lin Landsman MD, MD 01/21/2021 1:00:53 PM This report has been signed electronically. Number of Addenda: 0 Note Initiated On: 01/21/2021 12:18 PM Scope Withdrawal Time: 0 hours 4 minutes 22 seconds  Total Procedure Duration: 0 hours 11 minutes 13 seconds  Estimated Blood Loss:  Estimated blood loss: none.      [facility]

## 2021-01-21 NOTE — Anesthesia Procedure Notes (Signed)
Date/Time: 01/21/2021 12:32 PM Performed by: Nelda Marseille, CRNA Pre-anesthesia Checklist: Patient identified, Emergency Drugs available, Suction available, Patient being monitored and Timeout performed Oxygen Delivery Method: Nasal cannula

## 2021-01-22 ENCOUNTER — Encounter: Payer: Self-pay | Admitting: Internal Medicine

## 2021-01-22 ENCOUNTER — Inpatient Hospital Stay: Payer: Medicare Other

## 2021-01-22 DIAGNOSIS — J449 Chronic obstructive pulmonary disease, unspecified: Secondary | ICD-10-CM | POA: Diagnosis not present

## 2021-01-22 DIAGNOSIS — D649 Anemia, unspecified: Secondary | ICD-10-CM

## 2021-01-22 DIAGNOSIS — D509 Iron deficiency anemia, unspecified: Secondary | ICD-10-CM | POA: Diagnosis not present

## 2021-01-22 DIAGNOSIS — I10 Essential (primary) hypertension: Secondary | ICD-10-CM | POA: Diagnosis not present

## 2021-01-22 LAB — BASIC METABOLIC PANEL
Anion gap: 5 (ref 5–15)
BUN: 7 mg/dL — ABNORMAL LOW (ref 8–23)
CO2: 32 mmol/L (ref 22–32)
Calcium: 8.4 mg/dL — ABNORMAL LOW (ref 8.9–10.3)
Chloride: 101 mmol/L (ref 98–111)
Creatinine, Ser: 0.57 mg/dL (ref 0.44–1.00)
GFR, Estimated: >60 mL/min (ref >60)
Glucose, Bld: 94 mg/dL (ref 70–99)
Potassium: 3.7 mmol/L (ref 3.5–5.1)
Sodium: 138 mmol/L (ref 135–145)

## 2021-01-22 LAB — CBC
HCT: 27.6 % — ABNORMAL LOW (ref 36.0–46.0)
Hemoglobin: 8.3 g/dL — ABNORMAL LOW (ref 12.0–15.0)
MCH: 26 pg (ref 26.0–34.0)
MCHC: 30.1 g/dL (ref 30.0–36.0)
MCV: 86.5 fL (ref 80.0–100.0)
Platelets: 254 10*3/uL (ref 150–400)
RBC: 3.19 MIL/uL — ABNORMAL LOW (ref 3.87–5.11)
RDW: 18.1 % — ABNORMAL HIGH (ref 11.5–15.5)
WBC: 8.4 10*3/uL (ref 4.0–10.5)
nRBC: 0.8 % — ABNORMAL HIGH (ref 0.0–0.2)

## 2021-01-22 LAB — HEMOGLOBIN AND HEMATOCRIT, BLOOD
HCT: 28.4 % — ABNORMAL LOW (ref 36.0–46.0)
Hemoglobin: 8.8 g/dL — ABNORMAL LOW (ref 12.0–15.0)

## 2021-01-22 LAB — D-DIMER, QUANTITATIVE: D-Dimer, Quant: 2.17 ug/mL-FEU — ABNORMAL HIGH (ref 0.00–0.50)

## 2021-01-22 LAB — MAGNESIUM: Magnesium: 2.4 mg/dL (ref 1.7–2.4)

## 2021-01-22 MED ORDER — METOPROLOL TARTRATE 25 MG PO TABS
25.0000 mg | ORAL_TABLET | Freq: Two times a day (BID) | ORAL | Status: DC
  Administered 2021-01-22 – 2021-01-25 (×7): 25 mg via ORAL
  Filled 2021-01-22 (×7): qty 1

## 2021-01-22 MED ORDER — IOHEXOL 350 MG/ML SOLN
75.0000 mL | Freq: Once | INTRAVENOUS | Status: AC | PRN
  Administered 2021-01-22: 75 mL via INTRAVENOUS

## 2021-01-22 NOTE — Progress Notes (Signed)
SATURATION QUALIFICATIONS: (This note is used to comply with regulatory documentation for home oxygen)  Patient Saturations on Room Air at Rest = 78%  Patient Saturations on Room Air while Ambulating = %  Patient Saturations on  Liters of oxygen while Ambulating = %  Please briefly explain why patient needs home oxygen: Replaced on 4L and back up to 92%

## 2021-01-22 NOTE — Progress Notes (Signed)
PROGRESS NOTE    [name]   JEH:[id number]  DOB: 1946/05/29  PCP: Baxter Hire, MD    DOA: 01/20/2021 LOS: 1   Brief Narrative   [name] is a 75 y.o. female with medical history significant of HTN, COPD, GERD, hypothyroidism, depression, iron deficiency anemia, degenerative disc disease, RLS, who presented to the ED on 01/20/21, referred by her PCP due to finding of low hemoglobin on recent labs.  Patient was otherwise asymptomatic without melena, hemaochezia or other sign of blood loss.  She notably has history of iron deficiency anemia requiring iron transfusions.  Admitted and was transfused 2 units pRBC's, in addition to iron infusion.  GI was consulted and patient underwent EGD and colonoscopy which were unremarkable, no bleeding source was identified.    Patient is generally very weak.  PT to evaluate.   Assessment & Plan   Principal Problem:   Blood loss anemia Active Problems:   HTN (hypertension)   Iron deficiency anemia   GI bleeding   GERD (gastroesophageal reflux disease)   Hypothyroidism   Depression   COPD (chronic obstructive pulmonary disease) (HCC)   Symptomatic anemia   Symptomatic Anemia / History of Iron Deficiency / ?GI Bleeding -  GI consulted appreciate recommendations: --Follow antireflux diet indefinitely --Prilosec 40 mg p.o. twice daily --Outpatient GI follow-up for capsule endoscopy --Continue monitor hemoglobin, transfuse if less than 7.0  Hypotension -etiology not clear.  Seems unlikely related to sedation from procedures as BP was low overnight as well.  Afebrile and no leukocytosis or symptoms of infection.  Antihypertensives are held and BPs have been soft today.  Continue holding antihypertensives. --Maintain MAP at or above 65 with IV fluids if needed  Acute respiratory failure with hypoxia - O2 requirement has been 2 L/min, not on home O2.  Today up to 4 L/min, dropped to 78% on room air.   --o2 to keep sats > 88%, wean  as tolerated --given hypotension as well, PE evaluation underway --D-dimer elevated --CTA chest is pending  Altered mental status - resolved.  Daughter reported 3/25 afternoon reporting patient's speech is abnormal with words in the wrong order and slow word finding.  Says this is very atypical. Mostly likely was from sedation for endoscopy, resolved on its own.   --ok to stop neurochecks --CT head negative  Generalized weakness -nursing note patient to be very unsteady on her feet this morning.  PT evaluated and recommended SNF.  Patient declines but is excepting of home health.  TOC following. --Fall precautions  History of hypertension -currently hypotensive as above.  Continue holding Lasix, amlodipine, metoprolol.  GERD -on IV PPI.  Will discharge on omeprazole 40 mg p.o. twice daily per GI  Hypothyroidism -continue Synthroid  Depression -continue home meds  COPD -seems overall stable.  Patient endorses chronic wheezing on and off.  Will monitor.  Continue bronchodilators.  Obesity: Body mass index is 41.79 kg/m.   Complicates overall care and prognosis.  Recommend lifestyle modifications including physical activity and diet for weight loss and overall long-term health.   DVT prophylaxis: SCDs Start: 01/20/21 1439   Diet:  Diet Orders (From admission, onward)    Start     Ordered   01/21/21 1308  Diet heart healthy/carb modified Room service appropriate? Yes; Fluid consistency: Thin  Diet effective now       Question Answer Comment  Diet-HS Snack? Nothing   Room service appropriate? Yes   Fluid consistency: Thin  01/21/21 1307            Code Status: Full Code    Subjective 01/22/21    Patient seen at bedside this morning.  Says she wants to go home.  Her speech is clear, and she recalls it being a little off yesterday afternoon, says it is much better today.  Denies CP, palpitations, worsened SOB or other acute complaints.    Disposition Plan &  Communication   Status is: Inpatient  Inpatient status appropriate due to severity of illness.  Ongoing hypotensive episodes and now increased hypoxia.  Evaluation ongoing.   Dispo: The patient is from: Home              Anticipated d/c is to: Home              Patient currently is not medically stable for discharge   Difficult to place patient no     Family Communication: Spoke with daughter, [name] [phone]), by phone again today 3/26, updated on pt status and plan.     Consults, Procedures, Significant Events   Consultants:   GI  Procedures:  EGD - Impression:      - Normal duodenal bulb and second portion of the duodenum. - Normal stomach. - 8 cm hiatal hernia. - Z-line regular, 30 cm from the incisors. - Normal gastroesophageal junction and esophagus. - No specimens collected.  Colonoscopy - Impression:    - Preparation of the colon was poor. - Normal mucosa in the entire examined colon. - Stool in the entire examined colon. - The distal rectum and anal verge are normal on retroflexion view. - Diverticulosis in the sigmoid colon. - No specimens collected.  Antimicrobials:  Anti-infectives (From admission, onward)   None        Micro    Objective   Vitals:   01/22/21 0811 01/22/21 0824 01/22/21 1150 01/22/21 1247  BP: (!) 119/95  (!) 89/49 113/70  Pulse: (!) 118 (!) 103 92 95  Resp: 18  18   Temp: 98.5 F (36.9 C)  99.1 F (37.3 C)   TempSrc: Oral  Oral   SpO2: 94%  94% 95%  Weight:      Height:        Intake/Output Summary (Last 24 hours) at 01/22/2021 1451 Last data filed at 01/22/2021 1300 Gross per 24 hour  Intake 600 ml  Output 1 ml  Net 599 ml   Filed Weights   01/20/21 1315 01/21/21 1130  Weight: 90.7 kg 90.7 kg    Physical Exam:  General exam: awake, alert, no acute distress, obese Respiratory system: CTAB diminished, intermittent faint wheezes, normal respiratory effort, on 4 L/min oxygen. Cardiovascular system: normal  S1/S2, RRR, no pedal edema.   Central nervous system: no gross focal neurologic deficits, normal speech Extremities: moves all, no cyanosis, normal tone Psychiatry: normal mood, congruent affect, judgement and insight appear normal  Labs   Data Reviewed: I have personally reviewed following labs and imaging studies  CBC: Recent Labs  Lab 01/20/21 1325 01/20/21 1444 01/21/21 0526 01/21/21 1006 01/21/21 1416 01/22/21 0453 01/22/21 1143  WBC 6.8 6.2 7.8 7.7 8.2 8.4  --   NEUTROABS 4.8  --   --   --   --   --   --   HGB 6.5* 6.3* 9.4* 9.7* 9.4* 8.3* 8.8*  HCT 22.7* 21.5* 30.2* 31.2* 31.9* 27.6* 28.4*  MCV 82.5 82.4 84.8 86.2 87.4 86.5  --   PLT 322 306 281 261  248 254  --    Basic Metabolic Panel: Recent Labs  Lab 01/20/21 1325 01/21/21 0526 01/22/21 0453  NA 137 138 138  K 4.1 3.6 3.7  CL 99 100 101  CO2 29 30 32  GLUCOSE 115* 104* 94  BUN 12 8 7*  CREATININE 0.80 0.67 0.57  CALCIUM 8.9 8.8* 8.4*  MG  --   --  2.4   GFR: Estimated Creatinine Clearance: 60.1 mL/min (by C-G formula based on SCr of 0.57 mg/dL). Liver Function Tests: Recent Labs  Lab 01/20/21 1325  AST 16  ALT 10  ALKPHOS 71  BILITOT 0.5  PROT 6.4*  ALBUMIN 3.1*   No results for input(s): LIPASE, AMYLASE in the last 168 hours. No results for input(s): AMMONIA in the last 168 hours. Coagulation Profile: Recent Labs  Lab 01/20/21 1444  INR 1.1   Cardiac Enzymes: No results for input(s): CKTOTAL, CKMB, CKMBINDEX, TROPONINI in the last 168 hours. BNP (last 3 results) No results for input(s): PROBNP in the last 8760 hours. HbA1C: No results for input(s): HGBA1C in the last 72 hours. CBG: No results for input(s): GLUCAP in the last 168 hours. Lipid Profile: No results for input(s): CHOL, HDL, LDLCALC, TRIG, CHOLHDL, LDLDIRECT in the last 72 hours. Thyroid Function Tests: No results for input(s): TSH, T4TOTAL, FREET4, T3FREE, THYROIDAB in the last 72 hours. Anemia Panel: Recent Labs     01/20/21 1325 01/20/21 1425  VITAMINB12  --  249  FOLATE 5.3*  --   FERRITIN 4*  --   TIBC 500*  --   IRON 12*  --   RETICCTPCT 2.1  --    Sepsis Labs: No results for input(s): PROCALCITON, LATICACIDVEN in the last 168 hours.  Recent Results (from the past 240 hour(s))  Resp Panel by RT-PCR (Flu A&B, Covid) Nasopharyngeal Swab     Status: None   Collection Time: 01/20/21  2:40 PM   Specimen: Nasopharyngeal Swab; Nasopharyngeal(NP) swabs in vial transport medium  Result Value Ref Range Status   SARS Coronavirus 2 by RT PCR NEGATIVE NEGATIVE Final    Comment: (NOTE) SARS-CoV-2 target nucleic acids are NOT DETECTED.  The SARS-CoV-2 RNA is generally detectable in upper respiratory specimens during the acute phase of infection. The lowest concentration of SARS-CoV-2 viral copies this assay can detect is 138 copies/mL. A negative result does not preclude SARS-Cov-2 infection and should not be used as the sole basis for treatment or other patient management decisions. A negative result may occur with  improper specimen collection/handling, submission of specimen other than nasopharyngeal swab, presence of viral mutation(s) within the areas targeted by this assay, and inadequate number of viral copies(<138 copies/mL). A negative result must be combined with clinical observations, patient history, and epidemiological information. The expected result is Negative.  Fact Sheet for Patients:  EntrepreneurPulse.com.au  Fact Sheet for Healthcare Providers:  IncredibleEmployment.be  This test is no t yet approved or cleared by the [place] FDA and  has been authorized for detection and/or diagnosis of SARS-CoV-2 by FDA under an Emergency Use Authorization (EUA). This EUA will remain  in effect (meaning this test can be used) for the duration of the COVID-19 declaration under Section 564(b)(1) of the Act, 21 U.S.C.section 360bbb-3(b)(1), unless the  authorization is terminated  or revoked sooner.       Influenza A by PCR NEGATIVE NEGATIVE Final   Influenza B by PCR NEGATIVE NEGATIVE Final    Comment: (NOTE) The Xpert Xpress SARS-CoV-2/FLU/RSV plus assay is  intended as an aid in the diagnosis of influenza from Nasopharyngeal swab specimens and should not be used as a sole basis for treatment. Nasal washings and aspirates are unacceptable for Xpert Xpress SARS-CoV-2/FLU/RSV testing.  Fact Sheet for Patients: EntrepreneurPulse.com.au  Fact Sheet for Healthcare Providers: IncredibleEmployment.be  This test is not yet approved or cleared by the [place] FDA and has been authorized for detection and/or diagnosis of SARS-CoV-2 by FDA under an Emergency Use Authorization (EUA). This EUA will remain in effect (meaning this test can be used) for the duration of the COVID-19 declaration under Section 564(b)(1) of the Act, 21 U.S.C. section 360bbb-3(b)(1), unless the authorization is terminated or revoked.  Performed at [facility], [address] [place], [place] [address]       Imaging Studies   CT HEAD WO CONTRAST  Result Date: 01/21/2021 CLINICAL DATA:  Stroke symptoms EXAM: CT HEAD WITHOUT CONTRAST TECHNIQUE: Contiguous axial images were obtained from the base of the skull through the vertex without intravenous contrast. COMPARISON:  04/27/2019, 04/21/2019 FINDINGS: Brain: Stable atrophy and chronic white matter microvascular ischemic changes throughout both cerebral hemispheres. No definite acute intracranial hemorrhage, mass lesion, new infarction, midline shift, herniation, hydrocephalus, or extra-axial fluid collection. Cisterns are patent. Cerebellar atrophy as well. Vascular: Intracranial atherosclerosis at the skull base. No hyperdense vessel. Skull: Normal. Negative for fracture or focal lesion. Sinuses/Orbits: Orbits are symmetric. Mild chronic sinus mucosal thickening.  Postop changes of the left maxillary sinus. Mastoids are clear. Other: None. IMPRESSION: Stable atrophy and chronic white matter microvascular ischemic changes. No acute intracranial abnormality by noncontrast CT. Chronic sinus changes again noted. Electronically Signed   By: Jerilynn Mages.  Shick M.D.   On: 01/21/2021 17:29     Medications   Scheduled Meds: . cholecalciferol  2,000 Units Oral Daily  . citalopram  40 mg Oral Daily  . ferrous sulfate  325 mg Oral q morning  . folic acid  1 mg Oral Daily  . gabapentin  200 mg Oral QHS  . levothyroxine  112 mcg Oral QAC breakfast  . metoprolol tartrate  25 mg Oral BID  . mometasone-formoterol  2 puff Inhalation BID  . multivitamin with minerals  1 tablet Oral Daily  . pantoprazole (PROTONIX) IV  40 mg Intravenous Q12H  . senna-docusate  1 tablet Oral QHS  . tiotropium  18 mcg Inhalation Daily  . traZODone  100 mg Oral QHS  . vitamin B-12  1,000 mcg Oral Daily   Continuous Infusions: . sodium chloride 10 mL/hr at 01/20/21 1731       LOS: 1 day    Time spent: 30 minutes with > 50% spent at bedside at in coordination of care.    Ezekiel Slocumb, DO [facility]  01/22/2021, 2:51 PM      If 7PM-7AM, please contact night-coverage. How to contact the [facility] Attending or Consulting provider [place] or covering provider during after hours [place], for this patient?    1. Check the care team in [facility] and look for a) attending/consulting [facility] provider listed and b) the [facility] team listed 2. Log into [email or web address] and use [place]'s universal password to access. If you do not have the password, please contact the hospital operator. 3. Locate the [facility] provider you are looking for under [facility] and page to a number that you can be directly reached. 4. If you still have difficulty reaching the provider, please page the [facility] (Director on Call) for the Hospitalists listed  on amion for assistance.

## 2021-01-22 NOTE — Plan of Care (Signed)

## 2021-01-22 NOTE — Anesthesia Postprocedure Evaluation (Signed)
Anesthesia Post Note  Patient: [name]  Procedure(s) Performed: ESOPHAGOGASTRODUODENOSCOPY (EGD) WITH PROPOFOL (N/A ) COLONOSCOPY WITH PROPOFOL (N/A )  Patient location during evaluation: Endoscopy Anesthesia Type: General Level of consciousness: awake and alert Pain management: pain level controlled Vital Signs Assessment: post-procedure vital signs reviewed and stable Respiratory status: spontaneous breathing, nonlabored ventilation, respiratory function stable and patient connected to nasal cannula oxygen Cardiovascular status: blood pressure returned to baseline and stable Postop Assessment: no apparent nausea or vomiting Anesthetic complications: no   No complications documented.   Last Vitals:  Vitals:   01/22/21 1150 01/22/21 1247  BP: (!) 89/49 113/70  Pulse: 92 95  Resp: 18   Temp: 37.3 C   SpO2: 94% 95%    Last Pain:  Vitals:   01/22/21 1200  TempSrc:   PainSc: 0-No pain                 Martha Clan

## 2021-01-23 ENCOUNTER — Encounter: Payer: Self-pay | Admitting: Gastroenterology

## 2021-01-23 DIAGNOSIS — R911 Solitary pulmonary nodule: Secondary | ICD-10-CM | POA: Diagnosis not present

## 2021-01-23 DIAGNOSIS — D509 Iron deficiency anemia, unspecified: Secondary | ICD-10-CM | POA: Diagnosis not present

## 2021-01-23 DIAGNOSIS — J9601 Acute respiratory failure with hypoxia: Secondary | ICD-10-CM | POA: Diagnosis not present

## 2021-01-23 DIAGNOSIS — K449 Diaphragmatic hernia without obstruction or gangrene: Secondary | ICD-10-CM

## 2021-01-23 DIAGNOSIS — D649 Anemia, unspecified: Secondary | ICD-10-CM | POA: Diagnosis not present

## 2021-01-23 LAB — CBC
HCT: 28.6 % — ABNORMAL LOW (ref 36.0–46.0)
Hemoglobin: 8.6 g/dL — ABNORMAL LOW (ref 12.0–15.0)
MCH: 25.9 pg — ABNORMAL LOW (ref 26.0–34.0)
MCHC: 30.1 g/dL (ref 30.0–36.0)
MCV: 86.1 fL (ref 80.0–100.0)
Platelets: 258 10*3/uL (ref 150–400)
RBC: 3.32 MIL/uL — ABNORMAL LOW (ref 3.87–5.11)
RDW: 18.8 % — ABNORMAL HIGH (ref 11.5–15.5)
WBC: 9.2 10*3/uL (ref 4.0–10.5)
nRBC: 0.2 % (ref 0.0–0.2)

## 2021-01-23 LAB — BASIC METABOLIC PANEL
Anion gap: 6 (ref 5–15)
BUN: 6 mg/dL — ABNORMAL LOW (ref 8–23)
CO2: 32 mmol/L (ref 22–32)
Calcium: 8.4 mg/dL — ABNORMAL LOW (ref 8.9–10.3)
Chloride: 96 mmol/L — ABNORMAL LOW (ref 98–111)
Creatinine, Ser: 0.59 mg/dL (ref 0.44–1.00)
GFR, Estimated: >60 mL/min (ref >60)
Glucose, Bld: 99 mg/dL (ref 70–99)
Potassium: 3.8 mmol/L (ref 3.5–5.1)
Sodium: 134 mmol/L — ABNORMAL LOW (ref 135–145)

## 2021-01-23 NOTE — Plan of Care (Signed)
Continuing with plan of care. 

## 2021-01-23 NOTE — Progress Notes (Signed)
PROGRESS NOTE    GILIANA VANTIL   GUY:[id number]  DOB: 05-04-1946  PCP: Baxter Hire, MD    DOA: 01/20/2021 LOS: 2   Brief Narrative   [name] is a 75 y.o. female with medical history significant of HTN, COPD, GERD, hypothyroidism, depression, iron deficiency anemia, degenerative disc disease, RLS, who presented to the ED on 01/20/21, referred by her PCP due to finding of low hemoglobin on recent labs.  Patient was otherwise asymptomatic without melena, hemaochezia or other sign of blood loss.  She notably has history of iron deficiency anemia requiring iron transfusions.  Admitted and was transfused 2 units pRBC's, in addition to iron infusion.  GI was consulted and patient underwent EGD and colonoscopy which were unremarkable, no bleeding source was identified.    Patient is generally very weak.  PT to evaluate.   Assessment & Plan   Principal Problem:   Symptomatic anemia Active Problems:   HTN (hypertension)   Iron deficiency anemia   Acute respiratory failure with hypoxia (HCC)   GI bleeding   GERD (gastroesophageal reflux disease)   Hypothyroidism   Depression   COPD (chronic obstructive pulmonary disease) (HCC)   Hiatal hernia   Left upper lobe pulmonary nodule   Symptomatic Anemia / History of Iron Deficiency / ?GI Bleeding -  GI consulted appreciate recommendations: --Follow antireflux diet indefinitely --Prilosec 40 mg p.o. twice daily --Outpatient GI follow-up for capsule endoscopy --Hematology referral for IV iron infusions --Continue monitor hemoglobin, transfuse if less than 7.0  Hypotension -etiology not clear.  Seems unlikely related to sedation from procedures as BP was low overnight as well.  Afebrile and no leukocytosis or symptoms of infection.  Antihypertensives are held and BPs have been soft today.  Continue holding antihypertensives. --Maintain MAP at or above 65 with IV fluids if needed  Acute respiratory failure with hypoxia - O2  requirement has been 2 L/min, not on home O2.  Today up to 4 L/min, dropped to 78% on room air.   --o2 to keep sats > 88%, wean as tolerated --given hypotension as well, PE evaluation underway --D-dimer elevated --CTA chest negative for PE, large hiatal hernia with compressive atelectasis, LUL spiculated nodule --Qualifies for home o2 - ordered (per toc, could not be delivered today to home), barrier to d/c.  Pulmonary Nodule - with smoking hx and spiculated appearing nodule on CTA chest, concern for malignancy.  PET/CT was recommended for further evaluation. --Close outpatient follow up --Referring to hematology/oncology for IDA as well  Altered mental status - resolved.  Daughter reported 3/25 afternoon reporting patient's speech is abnormal with words in the wrong order and slow word finding.  Says this is very atypical. Mostly likely was from sedation for endoscopy, resolved on its own.   --ok to stop neurochecks --CT head negative  Generalized weakness -nursing note patient to be very unsteady on her feet this morning.  PT evaluated and recommended SNF.  Patient declines but is excepting of home health.  TOC following. --Fall precautions  History of hypertension -currently hypotensive as above.  Continue holding Lasix, amlodipine, metoprolol.  GERD -on IV PPI.  Will discharge on omeprazole 40 mg p.o. twice daily per GI  Hypothyroidism -continue Synthroid  Depression -continue home meds  COPD -seems overall stable.  Patient endorses chronic wheezing on and off.  Will monitor.  Continue bronchodilators.  Obesity: Body mass index is 41.79 kg/m.   Complicates overall care and prognosis.  Recommend lifestyle modifications including physical  activity and diet for weight loss and overall long-term health.   DVT prophylaxis: SCDs Start: 01/20/21 1439   Diet:  Diet Orders (From admission, onward)    Start     Ordered   01/21/21 1308  Diet heart healthy/carb modified Room service  appropriate? Yes; Fluid consistency: Thin  Diet effective now       Question Answer Comment  Diet-HS Snack? Nothing   Room service appropriate? Yes   Fluid consistency: Thin      01/21/21 1307            Code Status: Full Code    Subjective 01/23/21    Patient seen at bedside this morning.  Reports feeling fair.  No acute complaints except wanting to go home. Says her caregiver "fell in love and up and moved out" while she's been admitted.  Pt tells me several family members all live close by, will have plenty of help.  Daughter, however, reports there would be extended times with patient alone without help, although they live very close.   Disposition Plan & Communication   Status is: Inpatient  Inpatient status appropriate due to: unsafe d/c.  Pt requires home oxygen which was not able to be obtained at her home today.  In addition, inadequate home support/caregivers.  May require SNF placement if pt will agree.  Dispo: The patient is from: Home              Anticipated d/c is to: Home              Patient currently is medically stable for discharge   Difficult to place patient no     Family Communication: Spoke with daughter, Angela Nevin [phone]), by phone today 3/27, updated on pt status and plan.  Family trying to secure enough help at home for pt, as her roommate/caregiver has moved out.  She said would also encourage she reconsider SNF.   Consults, Procedures, Significant Events   Consultants:   GI  Procedures:  EGD - Impression:      - Normal duodenal bulb and second portion of the duodenum. - Normal stomach. - 8 cm hiatal hernia. - Z-line regular, 30 cm from the incisors. - Normal gastroesophageal junction and esophagus. - No specimens collected.  Colonoscopy - Impression:    - Preparation of the colon was poor. - Normal mucosa in the entire examined colon. - Stool in the entire examined colon. - The distal rectum and anal verge are normal on  retroflexion view. - Diverticulosis in the sigmoid colon. - No specimens collected.  Antimicrobials:  Anti-infectives (From admission, onward)   None        Micro    Objective   Vitals:   01/22/21 2015 01/22/21 2325 01/23/21 0424 01/23/21 1410  BP:  (!) 101/55 116/65 (!) 92/57  Pulse:  82 93 80  Resp:  20 20 19   Temp:  98.6 F (37 C) 98.5 F (36.9 C) 98.6 F (37 C)  TempSrc:  Oral Oral Oral  SpO2: 99% 98% 91% 94%  Weight:      Height:        Intake/Output Summary (Last 24 hours) at 01/23/2021 1612 Last data filed at 01/23/2021 1411 Gross per 24 hour  Intake 720 ml  Output 800 ml  Net -80 ml   Filed Weights   01/20/21 1315 01/21/21 1130  Weight: 90.7 kg 90.7 kg    Physical Exam:  General exam: awake, alert, no acute distress, obese Respiratory system: CTAB  diminished most at L base, no wheezes or rhonchi, normal respiratory effort, on 4 L/min oxygen. Cardiovascular system: normal S1/S2, RRR, no peripheral edema.   Central nervous system: no gross focal neurologic deficits, normal speech Psychiatry: normal mood, congruent affect, judgement and insight appear normal  Labs   Data Reviewed: I have personally reviewed following labs and imaging studies  CBC: Recent Labs  Lab 01/20/21 1325 01/20/21 1444 01/21/21 0526 01/21/21 1006 01/21/21 1416 01/22/21 0453 01/22/21 1143 01/23/21 0510  WBC 6.8   < > 7.8 7.7 8.2 8.4  --  9.2  NEUTROABS 4.8  --   --   --   --   --   --   --   HGB 6.5*   < > 9.4* 9.7* 9.4* 8.3* 8.8* 8.6*  HCT 22.7*   < > 30.2* 31.2* 31.9* 27.6* 28.4* 28.6*  MCV 82.5   < > 84.8 86.2 87.4 86.5  --  86.1  PLT 322   < > 281 261 248 254  --  258   < > = values in this interval not displayed.   Basic Metabolic Panel: Recent Labs  Lab 01/20/21 1325 01/21/21 0526 01/22/21 0453 01/23/21 0510  NA 137 138 138 134*  K 4.1 3.6 3.7 3.8  CL 99 100 101 96*  CO2 29 30 32 32  GLUCOSE 115* 104* 94 99  BUN 12 8 7* 6*  CREATININE 0.80 0.67 0.57  0.59  CALCIUM 8.9 8.8* 8.4* 8.4*  MG  --   --  2.4  --    GFR: Estimated Creatinine Clearance: 60.1 mL/min (by C-G formula based on SCr of 0.59 mg/dL). Liver Function Tests: Recent Labs  Lab 01/20/21 1325  AST 16  ALT 10  ALKPHOS 71  BILITOT 0.5  PROT 6.4*  ALBUMIN 3.1*   No results for input(s): LIPASE, AMYLASE in the last 168 hours. No results for input(s): AMMONIA in the last 168 hours. Coagulation Profile: Recent Labs  Lab 01/20/21 1444  INR 1.1   Cardiac Enzymes: No results for input(s): CKTOTAL, CKMB, CKMBINDEX, TROPONINI in the last 168 hours. BNP (last 3 results) No results for input(s): PROBNP in the last 8760 hours. HbA1C: No results for input(s): HGBA1C in the last 72 hours. CBG: No results for input(s): GLUCAP in the last 168 hours. Lipid Profile: No results for input(s): CHOL, HDL, LDLCALC, TRIG, CHOLHDL, LDLDIRECT in the last 72 hours. Thyroid Function Tests: No results for input(s): TSH, T4TOTAL, FREET4, T3FREE, THYROIDAB in the last 72 hours. Anemia Panel: No results for input(s): VITAMINB12, FOLATE, FERRITIN, TIBC, IRON, RETICCTPCT in the last 72 hours. Sepsis Labs: No results for input(s): PROCALCITON, LATICACIDVEN in the last 168 hours.  Recent Results (from the past 240 hour(s))  Resp Panel by RT-PCR (Flu A&B, Covid) Nasopharyngeal Swab     Status: None   Collection Time: 01/20/21  2:40 PM   Specimen: Nasopharyngeal Swab; Nasopharyngeal(NP) swabs in vial transport medium  Result Value Ref Range Status   SARS Coronavirus 2 by RT PCR NEGATIVE NEGATIVE Final    Comment: (NOTE) SARS-CoV-2 target nucleic acids are NOT DETECTED.  The SARS-CoV-2 RNA is generally detectable in upper respiratory specimens during the acute phase of infection. The lowest concentration of SARS-CoV-2 viral copies this assay can detect is 138 copies/mL. A negative result does not preclude SARS-Cov-2 infection and should not be used as the sole basis for treatment or other  patient management decisions. A negative result may occur with  improper specimen collection/handling,  submission of specimen other than nasopharyngeal swab, presence of viral mutation(s) within the areas targeted by this assay, and inadequate number of viral copies(<138 copies/mL). A negative result must be combined with clinical observations, patient history, and epidemiological information. The expected result is Negative.  Fact Sheet for Patients:  EntrepreneurPulse.com.au  Fact Sheet for Healthcare Providers:  IncredibleEmployment.be  This test is no t yet approved or cleared by the [place] FDA and  has been authorized for detection and/or diagnosis of SARS-CoV-2 by FDA under an Emergency Use Authorization (EUA). This EUA will remain  in effect (meaning this test can be used) for the duration of the COVID-19 declaration under Section 564(b)(1) of the Act, 21 U.S.C.section 360bbb-3(b)(1), unless the authorization is terminated  or revoked sooner.       Influenza A by PCR NEGATIVE NEGATIVE Final   Influenza B by PCR NEGATIVE NEGATIVE Final    Comment: (NOTE) The Xpert Xpress SARS-CoV-2/FLU/RSV plus assay is intended as an aid in the diagnosis of influenza from Nasopharyngeal swab specimens and should not be used as a sole basis for treatment. Nasal washings and aspirates are unacceptable for Xpert Xpress SARS-CoV-2/FLU/RSV testing.  Fact Sheet for Patients: EntrepreneurPulse.com.au  Fact Sheet for Healthcare Providers: IncredibleEmployment.be  This test is not yet approved or cleared by the [place] FDA and has been authorized for detection and/or diagnosis of SARS-CoV-2 by FDA under an Emergency Use Authorization (EUA). This EUA will remain in effect (meaning this test can be used) for the duration of the COVID-19 declaration under Section 564(b)(1) of the Act, 21 U.S.C. section  360bbb-3(b)(1), unless the authorization is terminated or revoked.  Performed at [facility], [address] [place], [place] [address]       Imaging Studies   CT HEAD WO CONTRAST  Result Date: 01/21/2021 CLINICAL DATA:  Stroke symptoms EXAM: CT HEAD WITHOUT CONTRAST TECHNIQUE: Contiguous axial images were obtained from the base of the skull through the vertex without intravenous contrast. COMPARISON:  04/27/2019, 04/21/2019 FINDINGS: Brain: Stable atrophy and chronic white matter microvascular ischemic changes throughout both cerebral hemispheres. No definite acute intracranial hemorrhage, mass lesion, new infarction, midline shift, herniation, hydrocephalus, or extra-axial fluid collection. Cisterns are patent. Cerebellar atrophy as well. Vascular: Intracranial atherosclerosis at the skull base. No hyperdense vessel. Skull: Normal. Negative for fracture or focal lesion. Sinuses/Orbits: Orbits are symmetric. Mild chronic sinus mucosal thickening. Postop changes of the left maxillary sinus. Mastoids are clear. Other: None. IMPRESSION: Stable atrophy and chronic white matter microvascular ischemic changes. No acute intracranial abnormality by noncontrast CT. Chronic sinus changes again noted. Electronically Signed   By: Jerilynn Mages.  Shick M.D.   On: 01/21/2021 17:29   CT ANGIO CHEST PE W OR WO CONTRAST  Result Date: 01/22/2021 CLINICAL DATA:  PE suspected, high prob Acute respiratory failure with hypoxia. EXAM: CT ANGIOGRAPHY CHEST WITH CONTRAST TECHNIQUE: Multidetector CT imaging of the chest was performed using the standard protocol during bolus administration of intravenous contrast. Multiplanar CT image reconstructions and MIPs were obtained to evaluate the vascular anatomy. CONTRAST:  24mL OMNIPAQUE IOHEXOL 350 MG/ML SOLN COMPARISON:  Chest CTA 07/29/2020.  No interval chest imaging. FINDINGS: Cardiovascular: Basilar assessment is limited due to motion artifact, additionally left basilar  assessment is further limited due to chronic compressive atelectasis related to large hiatal hernia. Allowing for these limitations, no evidence of pulmonary embolus. Aortic atherosclerosis and tortuosity. Left vertebral artery arises directly from the thoracic aorta, variant anatomy. No acute aortic findings. Heart size upper  normal. No pericardial effusion. Mediastinum/Nodes: Large hiatal hernia with greater than 50% of the stomach being intrathoracic. Thyroid gland is obscured by streak artifact from spinal hardware. There is a prominent anterior paratracheal node measuring 8 mm, series 4, image 25. No right hilar adenopathy. No convincing left hilar adenopathy, hilar evaluation partially obscured by adjacent opacity. Lungs/Pleura: Spiculated left upper lobe pulmonary nodule measures 1.7 x 1.3 cm, series 6, image 20, previously 1.4 x 0.9 cm, suspicious for primary bronchogenic malignancy. Increased left basilar atelectasis, much of which is compressive related to large hiatal hernia. There is a trace left pleural effusion. No pendant and linear atelectasis in the right lower lobe with minimal right pleural thickening, but no discrete effusion. Breathing motion artifact limits detailed assessment. Heterogeneous pulmonary parenchyma may represent small airways disease. Upper Abdomen: Greater than 50% of the stomach is intrathoracic with large hiatal hernia. Cholecystectomy. Motion artifact through the upper abdomen limits detailed assessment. Probable ventral abdominal wall hernia repair with mesh. Musculoskeletal: Surgical hardware in the lower cervical spine. Chronic T11 compression fracture. Callus formation about left anterior third rib, chronic. No evidence of focal bone lesion. Left shoulder arthroplasty. Advanced right glenohumeral osteoarthritis. Review of the MIP images confirms the above findings. IMPRESSION: 1. No evidence of pulmonary embolus allowing for motion artifact. 2. Spiculated left upper lobe  pulmonary nodule measuring 1.7 x 1.3 cm, previously 1.4 x 0.9 cm, suspicious for primary bronchogenic malignancy. Recommend further characterization with PET CT. 3. Increased left basilar atelectasis, much of which is compressive related to large hiatal hernia. Trace left pleural effusion. 4. Heterogeneous pulmonary parenchyma may represent small airways disease. 5. Prominent anterior paratracheal node is nonspecific. Aortic Atherosclerosis (ICD10-I70.0). Electronically Signed   By: Keith Rake M.D.   On: 01/22/2021 18:55     Medications   Scheduled Meds: . cholecalciferol  2,000 Units Oral Daily  . citalopram  40 mg Oral Daily  . ferrous sulfate  325 mg Oral q morning  . folic acid  1 mg Oral Daily  . gabapentin  200 mg Oral QHS  . levothyroxine  112 mcg Oral QAC breakfast  . metoprolol tartrate  25 mg Oral BID  . mometasone-formoterol  2 puff Inhalation BID  . multivitamin with minerals  1 tablet Oral Daily  . pantoprazole (PROTONIX) IV  40 mg Intravenous Q12H  . senna-docusate  1 tablet Oral QHS  . tiotropium  18 mcg Inhalation Daily  . traZODone  100 mg Oral QHS  . vitamin B-12  1,000 mcg Oral Daily   Continuous Infusions: . sodium chloride 10 mL/hr at 01/20/21 1731       LOS: 2 days    Time spent: 30 minutes with > 50% spent at bedside at in coordination of care.    Ezekiel Slocumb, DO [facility]  01/23/2021, 4:12 PM      If 7PM-7AM, please contact night-coverage. How to contact the [facility] Attending or Consulting provider [place] or covering provider during after hours [place], for this patient?    1. Check the care team in [facility] and look for a) attending/consulting [facility] provider listed and b) the [facility] team listed 2. Log into [email or web address] and use [place]'s universal password to access. If you do not have the password, please contact the hospital operator. 3. Locate the [facility] provider you are looking for under [facility] and page to a number that  you can be directly reached. 4. If you still have difficulty reaching the provider, please  page the [facility] (Director on Call) for the Hospitalists listed on amion for assistance.

## 2021-01-24 ENCOUNTER — Other Ambulatory Visit: Payer: Self-pay

## 2021-01-24 DIAGNOSIS — J9601 Acute respiratory failure with hypoxia: Secondary | ICD-10-CM | POA: Diagnosis not present

## 2021-01-24 DIAGNOSIS — D509 Iron deficiency anemia, unspecified: Secondary | ICD-10-CM | POA: Diagnosis not present

## 2021-01-24 DIAGNOSIS — D649 Anemia, unspecified: Secondary | ICD-10-CM | POA: Diagnosis not present

## 2021-01-24 DIAGNOSIS — J449 Chronic obstructive pulmonary disease, unspecified: Secondary | ICD-10-CM | POA: Diagnosis not present

## 2021-01-24 LAB — RESP PANEL BY RT-PCR (FLU A&B, COVID) ARPGX2
Influenza A by PCR: NEGATIVE
Influenza B by PCR: NEGATIVE
SARS Coronavirus 2 by RT PCR: NEGATIVE

## 2021-01-24 MED ORDER — SODIUM CHLORIDE 0.9 % IV SOLN
300.0000 mg | Freq: Once | INTRAVENOUS | Status: AC
  Administered 2021-01-24: 300 mg via INTRAVENOUS
  Filled 2021-01-24: qty 15

## 2021-01-24 MED ORDER — BISACODYL 10 MG RE SUPP
10.0000 mg | Freq: Every day | RECTAL | Status: DC | PRN
  Administered 2021-01-24: 10 mg via RECTAL
  Filled 2021-01-24: qty 1

## 2021-01-24 MED ORDER — BISACODYL 5 MG PO TBEC
5.0000 mg | DELAYED_RELEASE_TABLET | Freq: Every day | ORAL | Status: DC | PRN
  Administered 2021-01-24 – 2021-01-25 (×2): 5 mg via ORAL
  Filled 2021-01-24 (×2): qty 1

## 2021-01-24 NOTE — Progress Notes (Addendum)
VAST consulted to obtain IV access. Upon arrival at bedside, patient sitting in chair and eating lunch; pt requested VAST RN return at a later time. Pt's nurse, [place] notified; to place new consult once patient back in bed.

## 2021-01-24 NOTE — Progress Notes (Signed)
PROGRESS NOTE    MELYNDA KRZYWICKI   SHF:[id number]  DOB: 09-18-1946  PCP: Baxter Hire, MD    DOA: 01/20/2021 LOS: 3   Brief Narrative   [name] is a 75 y.o. female with medical history significant of HTN, COPD, GERD, hypothyroidism, depression, iron deficiency anemia, degenerative disc disease, RLS, who presented to the ED on 01/20/21, referred by her PCP due to finding of low hemoglobin on recent labs.  Patient was otherwise asymptomatic without melena, hemaochezia or other sign of blood loss.  She notably has history of iron deficiency anemia requiring iron transfusions.  Admitted and was transfused 2 units pRBC's, in addition to iron infusion.  GI was consulted and patient underwent EGD and colonoscopy which were unremarkable, no bleeding source was identified.    Patient is generally very weak.  PT to evaluate.   Assessment & Plan   Principal Problem:   Symptomatic anemia Active Problems:   HTN (hypertension)   Iron deficiency anemia   Acute respiratory failure with hypoxia (HCC)   GI bleeding   GERD (gastroesophageal reflux disease)   Hypothyroidism   Depression   COPD (chronic obstructive pulmonary disease) (HCC)   Hiatal hernia   Left upper lobe pulmonary nodule   Symptomatic Anemia / History of Iron Deficiency / ?GI Bleeding -  GI consulted appreciate recommendations: --Follow antireflux diet indefinitely --Prilosec 40 mg p.o. twice daily --Video capsule endoscopy - planned for tomorrow since pt still here and will miss outpatient appointment while at [facility] --Hematology referral for outpatient  IV iron infusions --Continue monitor hemoglobin, transfuse if less than 7.0 --Repeat Venofer infusion today  Hypotension -etiology not clear.  Resolved. BP's now normal and sometimes soft. Continue holding antihypertensives. --Maintain MAP at or above 65 with IV fluids if needed  Acute respiratory failure with hypoxia - O2 requirement has been 2 L/min, not on  home O2.  Today up to 4 L/min, dropped to 78% on room air.   --o2 to keep sats > 88%, wean as tolerated --given hypotension as well, PE evaluation underway --D-dimer elevated --CTA chest negative for PE, large hiatal hernia with compressive atelectasis, LUL spiculated nodule --Qualifies for home o2 - ordered   Pulmonary Nodule - with smoking hx and spiculated appearing nodule on CTA chest, concern for malignancy.  PET/CT was recommended for further evaluation. --Close outpatient follow up --Referring to hematology/oncology for IDA as well  Altered mental status - resolved.  Daughter reported 3/25 afternoon reporting patient's speech is abnormal with words in the wrong order and slow word finding.  Says this is very atypical. Mostly likely was from sedation for endoscopy, resolved on its own.   --ok to stop neurochecks --CT head negative  Generalized weakness -nursing note patient to be very unsteady on her feet this morning.  PT evaluated and recommended SNF.  Patient declines but is excepting of home health.  TOC following. --Fall precautions --SNF placement underway  History of hypertension -currently hypotensive as above.  Continue holding Lasix, amlodipine, metoprolol.  GERD -on IV PPI.  Will discharge on omeprazole 40 mg p.o. twice daily per GI  Hypothyroidism -continue Synthroid  Depression -continue home meds  COPD -seems overall stable.  Patient endorses chronic wheezing on and off.  Will monitor.  Continue bronchodilators.  Obesity: Body mass index is 41.79 kg/m.   Complicates overall care and prognosis.  Recommend lifestyle modifications including physical activity and diet for weight loss and overall long-term health.   DVT prophylaxis: SCDs Start:  01/20/21 1439   Diet:  Diet Orders (From admission, onward)    Start     Ordered   01/21/21 1308  Diet heart healthy/carb modified Room service appropriate? Yes; Fluid consistency: Thin  Diet effective now        Question Answer Comment  Diet-HS Snack? Nothing   Room service appropriate? Yes   Fluid consistency: Thin      01/21/21 1307            Code Status: Full Code    Subjective 01/24/21    Patient seen at bedside this morning.  Reports feeling fair.  Son at bedside.  Patient now agreeable to SNF for short term rehab prior to returning home.  She has not acute complaints.     Disposition Plan & Communication   Status is: Inpatient  Inpatient status appropriate due to: unsafe d/c.  Pt requires SNF placement due to loss of home caregiver, placement underway.  Dispo: The patient is from: Home              Anticipated d/c is to: Home              Patient currently is medically stable for discharge   Difficult to place patient no     Family Communication: Spoke with son at bedside on rounds today.      Consults, Procedures, Significant Events   Consultants:   GI  Procedures:  EGD - Impression:      - Normal duodenal bulb and second portion of the duodenum. - Normal stomach. - 8 cm hiatal hernia. - Z-line regular, 30 cm from the incisors. - Normal gastroesophageal junction and esophagus. - No specimens collected.  Colonoscopy - Impression:    - Preparation of the colon was poor. - Normal mucosa in the entire examined colon. - Stool in the entire examined colon. - The distal rectum and anal verge are normal on retroflexion view. - Diverticulosis in the sigmoid colon. - No specimens collected.  Antimicrobials:  Anti-infectives (From admission, onward)   None        Micro    Objective   Vitals:   01/24/21 0437 01/24/21 0800 01/24/21 1200 01/24/21 1546  BP: (!) 121/99 114/73 (!) 135/105 114/68  Pulse: 79 82 79 85  Resp: 16 16 16 20   Temp: 98.1 F (36.7 C) 98.2 F (36.8 C) 98.3 F (36.8 C) 98.6 F (37 C)  TempSrc:  Oral Oral Oral  SpO2: 93% 92% 96% 93%  Weight:      Height:        Intake/Output Summary (Last 24 hours) at 01/24/2021 1718 Last  data filed at 01/24/2021 1648 Gross per 24 hour  Intake 748.62 ml  Output 900 ml  Net -151.38 ml   Filed Weights   01/20/21 1315 01/21/21 1130  Weight: 90.7 kg 90.7 kg    Physical Exam:  General exam: awake, alert, no acute distress, obese Respiratory system: CTAB diminished most at L base, no wheezes or rhonchi, normal respiratory effort, on 2 L/min oxygen. Cardiovascular system: normal S1/S2, RRR, no peripheral edema.   Central nervous system: no gross focal neurologic deficits, normal speech Psychiatry: normal mood, congruent affect, judgement and insight appear normal  Labs   Data Reviewed: I have personally reviewed following labs and imaging studies  CBC: Recent Labs  Lab 01/20/21 1325 01/20/21 1444 01/21/21 0526 01/21/21 1006 01/21/21 1416 01/22/21 0453 01/22/21 1143 01/23/21 0510  WBC 6.8   < > 7.8 7.7 8.2 8.4  --  9.2  NEUTROABS 4.8  --   --   --   --   --   --   --   HGB 6.5*   < > 9.4* 9.7* 9.4* 8.3* 8.8* 8.6*  HCT 22.7*   < > 30.2* 31.2* 31.9* 27.6* 28.4* 28.6*  MCV 82.5   < > 84.8 86.2 87.4 86.5  --  86.1  PLT 322   < > 281 261 248 254  --  258   < > = values in this interval not displayed.   Basic Metabolic Panel: Recent Labs  Lab 01/20/21 1325 01/21/21 0526 01/22/21 0453 01/23/21 0510  NA 137 138 138 134*  K 4.1 3.6 3.7 3.8  CL 99 100 101 96*  CO2 29 30 32 32  GLUCOSE 115* 104* 94 99  BUN 12 8 7* 6*  CREATININE 0.80 0.67 0.57 0.59  CALCIUM 8.9 8.8* 8.4* 8.4*  MG  --   --  2.4  --    GFR: Estimated Creatinine Clearance: 60.1 mL/min (by C-G formula based on SCr of 0.59 mg/dL). Liver Function Tests: Recent Labs  Lab 01/20/21 1325  AST 16  ALT 10  ALKPHOS 71  BILITOT 0.5  PROT 6.4*  ALBUMIN 3.1*   No results for input(s): LIPASE, AMYLASE in the last 168 hours. No results for input(s): AMMONIA in the last 168 hours. Coagulation Profile: Recent Labs  Lab 01/20/21 1444  INR 1.1   Cardiac Enzymes: No results for input(s): CKTOTAL,  CKMB, CKMBINDEX, TROPONINI in the last 168 hours. BNP (last 3 results) No results for input(s): PROBNP in the last 8760 hours. HbA1C: No results for input(s): HGBA1C in the last 72 hours. CBG: No results for input(s): GLUCAP in the last 168 hours. Lipid Profile: No results for input(s): CHOL, HDL, LDLCALC, TRIG, CHOLHDL, LDLDIRECT in the last 72 hours. Thyroid Function Tests: No results for input(s): TSH, T4TOTAL, FREET4, T3FREE, THYROIDAB in the last 72 hours. Anemia Panel: No results for input(s): VITAMINB12, FOLATE, FERRITIN, TIBC, IRON, RETICCTPCT in the last 72 hours. Sepsis Labs: No results for input(s): PROCALCITON, LATICACIDVEN in the last 168 hours.  Recent Results (from the past 240 hour(s))  Resp Panel by RT-PCR (Flu A&B, Covid) Nasopharyngeal Swab     Status: None   Collection Time: 01/20/21  2:40 PM   Specimen: Nasopharyngeal Swab; Nasopharyngeal(NP) swabs in vial transport medium  Result Value Ref Range Status   SARS Coronavirus 2 by RT PCR NEGATIVE NEGATIVE Final    Comment: (NOTE) SARS-CoV-2 target nucleic acids are NOT DETECTED.  The SARS-CoV-2 RNA is generally detectable in upper respiratory specimens during the acute phase of infection. The lowest concentration of SARS-CoV-2 viral copies this assay can detect is 138 copies/mL. A negative result does not preclude SARS-Cov-2 infection and should not be used as the sole basis for treatment or other patient management decisions. A negative result may occur with  improper specimen collection/handling, submission of specimen other than nasopharyngeal swab, presence of viral mutation(s) within the areas targeted by this assay, and inadequate number of viral copies(<138 copies/mL). A negative result must be combined with clinical observations, patient history, and epidemiological information. The expected result is Negative.  Fact Sheet for Patients:  EntrepreneurPulse.com.au  Fact Sheet for  Healthcare Providers:  IncredibleEmployment.be  This test is no t yet approved or cleared by the [place] FDA and  has been authorized for detection and/or diagnosis of SARS-CoV-2 by FDA under an Emergency Use Authorization (EUA). This EUA will remain  in effect (meaning this test can be used) for the duration of the COVID-19 declaration under Section 564(b)(1) of the Act, 21 U.S.C.section 360bbb-3(b)(1), unless the authorization is terminated  or revoked sooner.       Influenza A by PCR NEGATIVE NEGATIVE Final   Influenza B by PCR NEGATIVE NEGATIVE Final    Comment: (NOTE) The Xpert Xpress SARS-CoV-2/FLU/RSV plus assay is intended as an aid in the diagnosis of influenza from Nasopharyngeal swab specimens and should not be used as a sole basis for treatment. Nasal washings and aspirates are unacceptable for Xpert Xpress SARS-CoV-2/FLU/RSV testing.  Fact Sheet for Patients: EntrepreneurPulse.com.au  Fact Sheet for Healthcare Providers: IncredibleEmployment.be  This test is not yet approved or cleared by the [place] FDA and has been authorized for detection and/or diagnosis of SARS-CoV-2 by FDA under an Emergency Use Authorization (EUA). This EUA will remain in effect (meaning this test can be used) for the duration of the COVID-19 declaration under Section 564(b)(1) of the Act, 21 U.S.C. section 360bbb-3(b)(1), unless the authorization is terminated or revoked.  Performed at [facility], [address] [place], [place] [address]   Resp Panel by RT-PCR (Flu A&B, Covid) Nasopharyngeal Swab     Status: None   Collection Time: 01/24/21  3:11 PM   Specimen: Nasopharyngeal Swab; Nasopharyngeal(NP) swabs in vial transport medium  Result Value Ref Range Status   SARS Coronavirus 2 by RT PCR NEGATIVE NEGATIVE Final    Comment: (NOTE) SARS-CoV-2 target nucleic acids are NOT DETECTED.  The SARS-CoV-2 RNA is  generally detectable in upper respiratory specimens during the acute phase of infection. The lowest concentration of SARS-CoV-2 viral copies this assay can detect is 138 copies/mL. A negative result does not preclude SARS-Cov-2 infection and should not be used as the sole basis for treatment or other patient management decisions. A negative result may occur with  improper specimen collection/handling, submission of specimen other than nasopharyngeal swab, presence of viral mutation(s) within the areas targeted by this assay, and inadequate number of viral copies(<138 copies/mL). A negative result must be combined with clinical observations, patient history, and epidemiological information. The expected result is Negative.  Fact Sheet for Patients:  EntrepreneurPulse.com.au  Fact Sheet for Healthcare Providers:  IncredibleEmployment.be  This test is no t yet approved or cleared by the [place] FDA and  has been authorized for detection and/or diagnosis of SARS-CoV-2 by FDA under an Emergency Use Authorization (EUA). This EUA will remain  in effect (meaning this test can be used) for the duration of the COVID-19 declaration under Section 564(b)(1) of the Act, 21 U.S.C.section 360bbb-3(b)(1), unless the authorization is terminated  or revoked sooner.       Influenza A by PCR NEGATIVE NEGATIVE Final   Influenza B by PCR NEGATIVE NEGATIVE Final    Comment: (NOTE) The Xpert Xpress SARS-CoV-2/FLU/RSV plus assay is intended as an aid in the diagnosis of influenza from Nasopharyngeal swab specimens and should not be used as a sole basis for treatment. Nasal washings and aspirates are unacceptable for Xpert Xpress SARS-CoV-2/FLU/RSV testing.  Fact Sheet for Patients: EntrepreneurPulse.com.au  Fact Sheet for Healthcare Providers: IncredibleEmployment.be  This test is not yet approved or cleared by the [place] FDA and has been authorized for detection and/or diagnosis of SARS-CoV-2 by FDA under an Emergency Use Authorization (EUA). This EUA will remain in effect (meaning this test can be used) for the duration of the COVID-19 declaration under Section 564(b)(1) of the Act, 21 U.S.C.  section 360bbb-3(b)(1), unless the authorization is terminated or revoked.  Performed at [facility], [address] [place], [place] [address]       Imaging Studies   CT ANGIO CHEST PE W OR WO CONTRAST  Result Date: 01/22/2021 CLINICAL DATA:  PE suspected, high prob Acute respiratory failure with hypoxia. EXAM: CT ANGIOGRAPHY CHEST WITH CONTRAST TECHNIQUE: Multidetector CT imaging of the chest was performed using the standard protocol during bolus administration of intravenous contrast. Multiplanar CT image reconstructions and MIPs were obtained to evaluate the vascular anatomy. CONTRAST:  91mL OMNIPAQUE IOHEXOL 350 MG/ML SOLN COMPARISON:  Chest CTA 07/29/2020.  No interval chest imaging. FINDINGS: Cardiovascular: Basilar assessment is limited due to motion artifact, additionally left basilar assessment is further limited due to chronic compressive atelectasis related to large hiatal hernia. Allowing for these limitations, no evidence of pulmonary embolus. Aortic atherosclerosis and tortuosity. Left vertebral artery arises directly from the thoracic aorta, variant anatomy. No acute aortic findings. Heart size upper normal. No pericardial effusion. Mediastinum/Nodes: Large hiatal hernia with greater than 50% of the stomach being intrathoracic. Thyroid gland is obscured by streak artifact from spinal hardware. There is a prominent anterior paratracheal node measuring 8 mm, series 4, image 25. No right hilar adenopathy. No convincing left hilar adenopathy, hilar evaluation partially obscured by adjacent opacity. Lungs/Pleura: Spiculated left upper lobe pulmonary nodule measures 1.7 x 1.3 cm, series 6, image  20, previously 1.4 x 0.9 cm, suspicious for primary bronchogenic malignancy. Increased left basilar atelectasis, much of which is compressive related to large hiatal hernia. There is a trace left pleural effusion. No pendant and linear atelectasis in the right lower lobe with minimal right pleural thickening, but no discrete effusion. Breathing motion artifact limits detailed assessment. Heterogeneous pulmonary parenchyma may represent small airways disease. Upper Abdomen: Greater than 50% of the stomach is intrathoracic with large hiatal hernia. Cholecystectomy. Motion artifact through the upper abdomen limits detailed assessment. Probable ventral abdominal wall hernia repair with mesh. Musculoskeletal: Surgical hardware in the lower cervical spine. Chronic T11 compression fracture. Callus formation about left anterior third rib, chronic. No evidence of focal bone lesion. Left shoulder arthroplasty. Advanced right glenohumeral osteoarthritis. Review of the MIP images confirms the above findings. IMPRESSION: 1. No evidence of pulmonary embolus allowing for motion artifact. 2. Spiculated left upper lobe pulmonary nodule measuring 1.7 x 1.3 cm, previously 1.4 x 0.9 cm, suspicious for primary bronchogenic malignancy. Recommend further characterization with PET CT. 3. Increased left basilar atelectasis, much of which is compressive related to large hiatal hernia. Trace left pleural effusion. 4. Heterogeneous pulmonary parenchyma may represent small airways disease. 5. Prominent anterior paratracheal node is nonspecific. Aortic Atherosclerosis (ICD10-I70.0). Electronically Signed   By: Keith Rake M.D.   On: 01/22/2021 18:55     Medications   Scheduled Meds: . cholecalciferol  2,000 Units Oral Daily  . citalopram  40 mg Oral Daily  . ferrous sulfate  325 mg Oral q morning  . folic acid  1 mg Oral Daily  . gabapentin  200 mg Oral QHS  . levothyroxine  112 mcg Oral QAC breakfast  . metoprolol tartrate  25  mg Oral BID  . mometasone-formoterol  2 puff Inhalation BID  . multivitamin with minerals  1 tablet Oral Daily  . pantoprazole (PROTONIX) IV  40 mg Intravenous Q12H  . senna-docusate  1 tablet Oral QHS  . tiotropium  18 mcg Inhalation Daily  . traZODone  100 mg Oral QHS  . vitamin B-12  1,000 mcg Oral Daily   Continuous Infusions: . sodium chloride 10 mL/hr at 01/20/21 1731       LOS: 3 days    Time spent: 30 minutes with > 50% spent at bedside at in coordination of care.    Ezekiel Slocumb, DO [facility]  01/24/2021, 5:18 PM      If 7PM-7AM, please contact night-coverage. How to contact the [facility] Attending or Consulting provider [place] or covering provider during after hours [place], for this patient?    1. Check the care team in [facility] and look for a) attending/consulting [facility] provider listed and b) the [facility] team listed 2. Log into [email or web address] and use [place] universal password to access. If you do not have the password, please contact the hospital operator. 3. Locate the [facility] provider you are looking for under [facility] and page to a number that you can be directly reached. 4. If you still have difficulty reaching the provider, please page the [facility] (Director on Call) for the Hospitalists listed on amion for assistance.

## 2021-01-24 NOTE — Care Management Important Message (Signed)
Important Message  Patient Details  Name: [name] MRN: [id number] Date of Birth: 1945-11-18   Medicare Important Message Given:  Yes     Dannette Barbara 01/24/2021, 11:33 AM

## 2021-01-24 NOTE — TOC Progression Note (Signed)
Transition of Care [facility]) - Progression Note    Patient Details  Name: [name] MRN: [id number] Date of Birth: [birth year]  Transition of [facility] CM/SW Contact  Beverly Sessions, RN Phone Number: 01/24/2021, 1:52 PM  Clinical Narrative:     Insurance auth approved.  Plan auth ID # pending   Expected Discharge Plan: [place] Barriers to Discharge: Continued Medical Work up  Expected Discharge Plan and Services Expected Discharge Plan: [place]   Discharge Planning Services: CM Consult Post Acute Care Choice: [place] arrangements for the past 2 months: Single Family Home                           HH Arranged: PT,OT,Nurse's Aide           Social Determinants of Health (SDOH) Interventions    Readmission Risk Interventions No flowsheet data found.

## 2021-01-24 NOTE — NC FL2 (Signed)
[place] LEVEL OF CARE SCREENING TOOL     IDENTIFICATION  Patient Name: [name] Birthdate: 17-Oct-1946 Sex: female Admission Date (Current Location): 01/20/2021  [facility] and [place] Number:  Engineering geologist and Address:         Provider Number: [id number]  Attending Physician Name and Address:  Ezekiel Slocumb, DO  Relative Name and Phone Number:       Current Level of Care: Hospital Recommended Level of Care: [place] Prior Approval Number:    Date Approved/Denied:   PASRR Number: [id number]  Discharge Plan: SNF    Current Diagnoses: Patient Active Problem List   Diagnosis Date Noted  . Hiatal hernia 01/23/2021  . Left upper lobe pulmonary nodule 01/23/2021  . Symptomatic anemia 01/21/2021  . GI bleeding 01/20/2021  . GERD (gastroesophageal reflux disease) 01/20/2021  . Hypothyroidism 01/20/2021  . Depression 01/20/2021  . COPD (chronic obstructive pulmonary disease) ([place]) 01/20/2021  . History of recent fall 07/30/2020  . Fracture of one rib, left side, initial encounter for closed fracture 07/30/2020  . Hypoxia 07/30/2020  . Chest pain, atypical 07/30/2020  . COPD exacerbation ([place]) 07/30/2020  . Community acquired pneumonia 03/20/2020  . Type 2 diabetes mellitus without complication ([place]) 18/29/9371  . Obesity, Class III, BMI 40-49.9 (morbid obesity) ([place]) 03/20/2020  . Acute respiratory failure with hypoxia ([place]) 03/19/2020  . Hyponatremia 04/27/2019  . Hallucinations, visual 04/27/2019  . Iron deficiency anemia 02/01/2018  . Acute exacerbation of chronic obstructive pulmonary disease (COPD) ([place]) 11/05/2016  . Spinal stenosis, lumbar region, with neurogenic claudication 07/08/2015  . Sacroiliac joint dysfunction 06/28/2015  . Greater trochanteric bursitis 04/10/2015  . Sacroiliac joint disease 03/14/2015  . Facet syndrome, lumbar 03/14/2015  . HTN (hypertension) 03/06/2015  . Thyroid disease 03/06/2015  .  DDD (degenerative disc disease), cervical 03/04/2015  . DDD (degenerative disc disease), lumbar 03/04/2015  . DJD (degenerative joint disease) of knee 03/04/2015    Orientation RESPIRATION BLADDER Height & Weight     Self,Time,Situation,Place  O2 (2L [place]) Incontinent,External catheter Weight: 90.7 kg Height:  4\' 10"  (147.3 cm)  BEHAVIORAL SYMPTOMS/MOOD NEUROLOGICAL BOWEL NUTRITION STATUS      Incontinent Diet (Heart Healthy Carb modified)  AMBULATORY STATUS COMMUNICATION OF NEEDS Skin   Extensive Assist Verbally Bruising                       Personal Care Assistance Level of Assistance              Functional Limitations Info             SPECIAL CARE FACTORS FREQUENCY  PT (By licensed PT),OT (By licensed OT)                    Contractures Contractures Info: Not present    Additional Factors Info  Code Status,Allergies Code Status Info: Full Allergies Info: Levofloxacin, Adhesive (Tape), plastic tape           Current Medications (01/24/2021):  This is the current hospital active medication list Current Facility-Administered Medications  Medication Dose Route Frequency Provider Last Rate Last Admin  . 0.9 %  sodium chloride infusion   Intravenous Continuous Lin Landsman, MD 10 mL/hr at 01/20/21 1731 Restarted at 01/21/21 1222  . acetaminophen (TYLENOL) tablet 650 mg  650 mg Oral Q6H PRN Ivor Costa, MD   650 mg at 01/23/21 1041  . albuterol (VENTOLIN HFA) 108 (90 Base) MCG/ACT inhaler  2 puff  2 puff Inhalation Q4H PRN Ivor Costa, MD   2 puff at 01/21/21 0231  . cholecalciferol (VITAMIN D) tablet 2,000 Units  2,000 Units Oral Daily Ivor Costa, MD   2,000 Units at 01/24/21 1002  . citalopram (CELEXA) tablet 40 mg  40 mg Oral Daily Ivor Costa, MD   40 mg at 01/24/21 1002  . dextromethorphan-guaiFENesin (MUCINEX DM) 30-600 MG per 12 hr tablet 1 tablet  1 tablet Oral BID PRN Ivor Costa, MD      . ferrous sulfate tablet 325 mg  325 mg Oral q morning  Ivor Costa, MD   325 mg at 01/24/21 1003  . folic acid (FOLVITE) tablet 1 mg  1 mg Oral Daily Vanga, Tally Due, MD   1 mg at 01/24/21 1002  . gabapentin (NEURONTIN) capsule 200 mg  200 mg Oral QHS Ivor Costa, MD   200 mg at 01/23/21 2243  . hydrALAZINE (APRESOLINE) injection 5 mg  5 mg Intravenous Q2H PRN Ivor Costa, MD      . iron sucrose (VENOFER) 300 mg in sodium chloride 0.9 % 250 mL IVPB  300 mg Intravenous Once Nicole Kindred A, DO      . levothyroxine (SYNTHROID) tablet 112 mcg  112 mcg Oral QAC breakfast Ivor Costa, MD   112 mcg at 01/24/21 0710  . metoprolol tartrate (LOPRESSOR) tablet 25 mg  25 mg Oral BID Nicole Kindred A, DO   25 mg at 01/24/21 1002  . mometasone-formoterol (DULERA) 200-5 MCG/ACT inhaler 2 puff  2 puff Inhalation BID Ivor Costa, MD   2 puff at 01/24/21 1003  . multivitamin with minerals tablet 1 tablet  1 tablet Oral Daily Ivor Costa, MD   1 tablet at 01/24/21 1002  . ondansetron (ZOFRAN) injection 4 mg  4 mg Intravenous Q8H PRN Ivor Costa, MD   4 mg at 01/20/21 2110  . pantoprazole (PROTONIX) injection 40 mg  40 mg Intravenous Q12H Ivor Costa, MD   40 mg at 01/24/21 1002  . senna-docusate (Senokot-S) tablet 1 tablet  1 tablet Oral QHS Ivor Costa, MD   1 tablet at 01/23/21 2243  . tiotropium (SPIRIVA) inhalation capsule ([facility] use ONLY) 18 mcg  18 mcg Inhalation Daily Ivor Costa, MD   18 mcg at 01/24/21 1002  . traZODone (DESYREL) tablet 100 mg  100 mg Oral QHS Ivor Costa, MD   100 mg at 01/23/21 2243  . vitamin B-12 (CYANOCOBALAMIN) tablet 1,000 mcg  1,000 mcg Oral Daily Lin Landsman, MD   1,000 mcg at 01/24/21 1002     Discharge Medications: Please see discharge summary for a list of discharge medications.  Relevant Imaging Results:  Relevant Lab Results:   Additional Information [id number]  Beverly Sessions, RN

## 2021-01-24 NOTE — Progress Notes (Signed)
Patient needs a capsule study per Dr. Marius Ditch on 01/26/2021. Put the referral in the chart and printed instructions for patient. Called patient and left a message for call back. Called patient son and went over instructions. He verbalized understanding and will get one of the hospital nurses to print the instructions for them

## 2021-01-24 NOTE — TOC Progression Note (Addendum)
Transition of Care [facility]) - Progression Note    Patient Details  Name: [name] MRN: [id number] Date of Birth: Aug 30, 1946  [facility] CM/SW Contact  Beverly Sessions, RN Phone Number: 01/24/2021, 10:30 AM  Clinical Narrative:    Son at bedside Patient states that she is now agreeable to bed search for SNF  Existing PASRR fl2 sent for signature Bed Search started.  Does not want [facility]  Update:  Bed offers presented.  Patient accepted bed at [facility] and rehab  Accepted in [place].  Notified Ricky from [employer] started in portal Patient will need repeat covid test.  MD aware     Expected Discharge Plan: [place] Barriers to Discharge: Continued Medical Work up  Expected Discharge Plan and Services Expected Discharge Plan: [place]   Discharge Planning Services: CM Consult Post Acute Care Choice: [place] arrangements for the past 2 months: Single Family Home                           HH Arranged: PT,OT,Nurse's Aide           Social Determinants of Health (SDOH) Interventions    Readmission Risk Interventions No flowsheet data found.

## 2021-01-24 NOTE — Progress Notes (Signed)
Physical Therapy Treatment Patient Details Name: [name] MRN: [id number] DOB: [birth year] Today's Date: 01/24/2021    History of Present Illness Pt is a 75 y.o. female with medical history significant of HTN, COPD, GERD, hypothyroidism, depression, iron deficiency anemia, degenerative disc disease, RLS, who presents with abnormal lab with low hemoglobin.  MD assessment includes: 8cm hiatal hernia, sigmoid diverticulosis, blood loss anemia, and iron deficiency anemia.    PT Comments    Pt resting in bed upon PT arrival; pt's son present but left during PT session (returned end of session).  Pt appearing with flat affect and did not talk much during session although pt did participate with sessions activities.  Min assist to stand from bed (x3 trials) with UE support on RW (pt tending to lean on RW for support).  CGA to ambulate 4 feet with RW bed to recliner (pt leaning onto RW for support); limited distance ambulating d/t pt fatigue and SOB.  O2 sats 89% or greater on 2 L O2 via nasal cannula during sessions activities (92% end of session at rest).  Will continue to focus on strengthening, endurance, activity tolerance, and progressive functional mobility per pt tolerance.    Follow Up Recommendations  SNF     Equipment Recommendations  Rolling walker with 5" wheels    Recommendations for Other Services       Precautions / Restrictions Precautions Precautions: Fall Restrictions Weight Bearing Restrictions: No    Mobility  Bed Mobility Overal bed mobility: Needs Assistance Bed Mobility: Supine to Sit     Supine to sit: Min assist;Mod assist     General bed mobility comments: assist for trunk; vc's for technique    Transfers Overall transfer level: Needs assistance Equipment used: Rolling walker (2 wheeled) Transfers: Sit to/from Stand Sit to Stand: Min assist         General transfer comment: x3 trials from bed; vc's for UE placement; increased effort and time  to stand; pt tending to lean onto walker for support to stand  Ambulation/Gait Ambulation/Gait assistance: Min guard Gait Distance (Feet): 4 Feet Assistive device: Rolling walker (2 wheeled)   Gait velocity: decreased   General Gait Details: pt with flexed posture leaning onto walker for support; increased effort and time to take steps   Stairs             Wheelchair Mobility    Modified Rankin (Stroke Patients Only)       Balance Overall balance assessment: Needs assistance Sitting-balance support: No upper extremity supported;Feet supported Sitting balance-Leahy Scale: Good Sitting balance - Comments: steady sitting reaching within BOS   Standing balance support: Bilateral upper extremity supported Standing balance-Leahy Scale: Fair Standing balance comment: pt leaning onto RW for support                            Cognition Arousal/Alertness: Awake/alert Behavior During Therapy: Flat affect Overall Cognitive Status: Within Functional Limits for tasks assessed                                        Exercises      General Comments   Nursing cleared pt for participation in physical therapy.  Pt agreeable to PT session.  Pt reporting no need for purewick in chair currently (NT notified).      Pertinent Vitals/Pain Pain Assessment: Faces Faces  Pain Scale: Hurts little more Pain Location: chronic low back pain Pain Descriptors / Indicators: Discomfort;Sore Pain Intervention(s): Limited activity within patient's tolerance;Monitored during session;Repositioned  HR WFL during sessions activities.    Home Living                      Prior Function            PT Goals (current goals can now be found in the care plan section) Acute Rehab PT Goals Patient Stated Goal: To be able to walk better PT Goal Formulation: With patient Time For Goal Achievement: 02/03/21 Potential to Achieve Goals: Fair Progress towards PT  goals: Progressing toward goals    Frequency    Min 2X/week      PT Plan Current plan remains appropriate    Co-evaluation              AM-PAC PT "6 Clicks" Mobility   Outcome Measure  Help needed turning from your back to your side while in a flat bed without using bedrails?: A Little Help needed moving from lying on your back to sitting on the side of a flat bed without using bedrails?: A Lot Help needed moving to and from a bed to a chair (including a wheelchair)?: A Little Help needed standing up from a chair using your arms (e.g., wheelchair or bedside chair)?: A Little Help needed to walk in hospital room?: A Lot Help needed climbing 3-5 steps with a railing? : Total 6 Click Score: 14    End of Session Equipment Utilized During Treatment: Gait belt;Oxygen (2 L via nasal cannula) Activity Tolerance: Patient limited by fatigue Patient left: in chair;with call bell/phone within reach;with chair alarm set;with family/visitor present Nurse Communication: Mobility status;Precautions (via white board) PT Visit Diagnosis: Unsteadiness on feet (R26.81);History of falling (Z91.81);Muscle weakness (generalized) (M62.81);Difficulty in walking, not elsewhere classified (R26.2)     Time: 7948-0165 PT Time Calculation (min) (ACUTE ONLY): 26 min  Charges:  $Therapeutic Activity: 23-37 mins                    Leitha Bleak, PT 01/24/21, 11:32 AM

## 2021-01-25 ENCOUNTER — Ambulatory Visit: Admission: RE | Admit: 2021-01-25 | Payer: Medicare Other | Source: Home / Self Care | Admitting: Gastroenterology

## 2021-01-25 ENCOUNTER — Encounter: Payer: Self-pay | Admitting: Certified Registered Nurse Anesthetist

## 2021-01-25 DIAGNOSIS — J449 Chronic obstructive pulmonary disease, unspecified: Secondary | ICD-10-CM | POA: Diagnosis not present

## 2021-01-25 DIAGNOSIS — D649 Anemia, unspecified: Secondary | ICD-10-CM | POA: Diagnosis not present

## 2021-01-25 DIAGNOSIS — I10 Essential (primary) hypertension: Secondary | ICD-10-CM | POA: Diagnosis not present

## 2021-01-25 DIAGNOSIS — D509 Iron deficiency anemia, unspecified: Secondary | ICD-10-CM | POA: Diagnosis not present

## 2021-01-25 LAB — COPPER, SERUM: Copper: 120 ug/dL (ref 80–158)

## 2021-01-25 LAB — HEMOGLOBIN AND HEMATOCRIT, BLOOD
HCT: 32.1 % — ABNORMAL LOW (ref 36.0–46.0)
Hemoglobin: 9.7 g/dL — ABNORMAL LOW (ref 12.0–15.0)

## 2021-01-25 MED ORDER — BISACODYL 10 MG RE SUPP: 10.0000 mg | Freq: Every day | RECTAL | 0 refills | Status: DC | PRN

## 2021-01-25 MED ORDER — METOPROLOL TARTRATE 25 MG PO TABS: 25.0000 mg | ORAL_TABLET | Freq: Two times a day (BID) | ORAL | Status: DC

## 2021-01-25 MED ORDER — BISACODYL 5 MG PO TBEC: 5.0000 mg | DELAYED_RELEASE_TABLET | Freq: Every day | ORAL | 0 refills | Status: DC | PRN

## 2021-01-25 MED ORDER — FOLIC ACID 1 MG PO TABS: 1.0000 mg | ORAL_TABLET | Freq: Every day | ORAL | 1 refills | Status: DC

## 2021-01-25 MED ORDER — CYANOCOBALAMIN 1000 MCG PO TABS: 1000.0000 ug | ORAL_TABLET | Freq: Every day | ORAL | 1 refills | Status: DC

## 2021-01-25 NOTE — Plan of Care (Signed)
  Problem: Education: Goal: Knowledge of General Education information will improve Description: Including pain rating scale, medication(s)/side effects and non-pharmacologic comfort measures 01/25/2021 1323 by Vivien Rota, RN Outcome: Adequate for Discharge 01/25/2021 1259 by Vivien Rota, RN Outcome: Progressing   Problem: Health Behavior/Discharge Planning: Goal: Ability to manage health-related needs will improve 01/25/2021 1323 by Vivien Rota, RN Outcome: Adequate for Discharge 01/25/2021 1259 by Vivien Rota, RN Outcome: Progressing   Problem: Clinical Measurements: Goal: Ability to maintain clinical measurements within normal limits will improve 01/25/2021 1323 by Vivien Rota, RN Outcome: Adequate for Discharge 01/25/2021 1259 by Vivien Rota, RN Outcome: Progressing Goal: Will remain free from infection 01/25/2021 1323 by Vivien Rota, RN Outcome: Adequate for Discharge 01/25/2021 1259 by Vivien Rota, RN Outcome: Progressing Goal: Diagnostic test results will improve 01/25/2021 1323 by Vivien Rota, RN Outcome: Adequate for Discharge 01/25/2021 1259 by Vivien Rota, RN Outcome: Progressing Goal: Respiratory complications will improve 01/25/2021 1323 by Vivien Rota, RN Outcome: Adequate for Discharge 01/25/2021 1259 by Vivien Rota, RN Outcome: Progressing Goal: Cardiovascular complication will be avoided 01/25/2021 1323 by Vivien Rota, RN Outcome: Adequate for Discharge 01/25/2021 1259 by Vivien Rota, RN Outcome: Progressing   Problem: Activity: Goal: Risk for activity intolerance will decrease 01/25/2021 1323 by Vivien Rota, RN Outcome: Adequate for Discharge 01/25/2021 1259 by Vivien Rota, RN Outcome: Progressing   Problem: Nutrition: Goal: Adequate nutrition will be maintained 01/25/2021 1323 by Vivien Rota, RN Outcome:  Adequate for Discharge 01/25/2021 1259 by Vivien Rota, RN Outcome: Progressing   Problem: Coping: Goal: Level of anxiety will decrease 01/25/2021 1323 by Vivien Rota, RN Outcome: Adequate for Discharge 01/25/2021 1259 by Vivien Rota, RN Outcome: Progressing   Problem: Elimination: Goal: Will not experience complications related to bowel motility 01/25/2021 1323 by Vivien Rota, RN Outcome: Adequate for Discharge 01/25/2021 1259 by Vivien Rota, RN Outcome: Progressing Goal: Will not experience complications related to urinary retention 01/25/2021 1323 by Vivien Rota, RN Outcome: Adequate for Discharge 01/25/2021 1259 by Vivien Rota, RN Outcome: Progressing   Problem: Pain Managment: Goal: General experience of comfort will improve 01/25/2021 1323 by Vivien Rota, RN Outcome: Adequate for Discharge 01/25/2021 1259 by Vivien Rota, RN Outcome: Progressing   Problem: Safety: Goal: Ability to remain free from injury will improve 01/25/2021 1323 by Vivien Rota, RN Outcome: Adequate for Discharge 01/25/2021 1259 by Vivien Rota, RN Outcome: Progressing   Problem: Skin Integrity: Goal: Risk for impaired skin integrity will decrease 01/25/2021 1323 by Vivien Rota, RN Outcome: Adequate for Discharge 01/25/2021 1259 by Vivien Rota, RN Outcome: Progressing

## 2021-01-25 NOTE — Discharge Summary (Signed)
Physician Discharge Summary  [name] QJF:[id number] DOB: Feb 23, 1946 DOA: 01/20/2021  PCP: Baxter Hire, MD  Admit date: 01/20/2021 Discharge date: 01/25/2021  Admitted From: home Disposition:  SNF  Recommendations for Outpatient Follow-up:  1. Follow up with PCP in 1-2 weeks 2. Please obtain BMP/CBC in one week 3. Please follow up with GI for outpatient video capsule study, this is not urgent as long as hemoglobin stable 4. Follow up with hematology for iron deficiency anemia and IV iron transfusions if needed  Home Health: No  Equipment/Devices: None   Discharge Condition: Stable  CODE STATUS: Full  Diet recommendation: Heart Healthy / Carb modified  Discharge Diagnoses: Principal Problem:   Symptomatic anemia Active Problems:   HTN (hypertension)   Iron deficiency anemia   Acute respiratory failure with hypoxia (HCC)   GI bleeding   GERD (gastroesophageal reflux disease)   Hypothyroidism   Depression   COPD (chronic obstructive pulmonary disease) (HCC)   Hiatal hernia   Left upper lobe pulmonary nodule    Summary of HPI and Hospital Course:  [name] is a 75 y.o. female with medical history significant of HTN, COPD, GERD, hypothyroidism, depression, iron deficiency anemia, degenerative disc disease, RLS, who presented to the ED on 01/20/21, referred by her PCP due to finding of low hemoglobin on recent labs.  Patient was otherwise asymptomatic without melena, hemaochezia or other sign of blood loss.  She notably has history of iron deficiency anemia requiring iron transfusions.  Admitted and was transfused 2 units pRBC's, in addition to iron infusion.  GI was consulted and patient underwent EGD and colonoscopy which were unremarkable, no bleeding source was identified.    Patient is generally very weak.  PT to evaluate.   Patient is clinical improved and stable for d/c to SNF today.     Symptomatic Anemia / History of Iron Deficiency / ?GI Bleeding -   GI consulted appreciate recommendations: --Follow antireflux diet indefinitely --Prilosec 40 mg p.o. twice daily --Video capsule endoscopy - as outpatient, non-urgent if Hbg remains stable --Hematology referral for outpatient  IV iron infusions --Repeat CBC in 1 week --Transfuse Hbg if less than 7.0 --Patient was given Venofer infusion x 2 during admission Hemoglobin is increasing and up to 9.7 today  Hypotension -etiology not clear.  Resolved but remains with normal and at times soft BP's with reduced dose of metoprolol and holding amlodipine. --Stopped amlodipine at d/c --Continue reduced dose of metoprolol 25 mg (was 100 mg)  Acute respiratory failure with hypoxia - O2 requirement has been 2 L/min, not on home O2.  --Supplement O2 as needed to keep sats > 88%, wean as tolerated --given hypotension as well, PE evaluation was performed --D-dimer elevated --CTA chest negative for PE, large hiatal hernia with compressive atelectasis, LUL spiculated nodule  Pulmonary Nodule - with smoking hx and spiculated appearing nodule on CTA chest, concern for malignancy.  PET/CT was recommended for further evaluation. --Close outpatient follow up --Referring to hematology/oncology for IDA as well  Altered mental status - resolved. Mostly likely was from sedation for endoscopy, resolved on its own.  Daughter reported 3/25 afternoon patient's speech is abnormal with words in the wrong order and slow word finding.  Says this is very atypical.  Monitored with neurochecks and no changes were noted. --CT head negative  Generalized weakness -nursing note patient to be very unsteady on her feet this morning.  PT evaluated and recommended SNF.  Patient declines but is excepting of home  health.  TOC following. --Fall precautions --SNF for short term rehab  History of hypertension -currently hypotensive as above.  Continue holding Lasix, amlodipine, metoprolol.  GERD - omeprazole 40 mg p.o. twice  daily per GI  Hypothyroidism -continue Synthroid  Depression -continue home meds  COPD -seems overall stable.  Patient endorses chronic wheezing on and off.  Will monitor.  Continue bronchodilators.  Obesity: Body mass index is 41.79 kg/m.   Complicates overall care and prognosis.  Recommend lifestyle modifications including physical activity and diet for weight loss and overall long-term health.    Discharge Instructions   Discharge Instructions    Call MD for:  extreme fatigue   Complete by: As directed    Call MD for:  persistant dizziness or light-headedness   Complete by: As directed    Call MD for:  persistant nausea and vomiting   Complete by: As directed    Call MD for:  severe uncontrolled pain   Complete by: As directed    Call MD for:  temperature >100.4   Complete by: As directed    Increase activity slowly   Complete by: As directed    Increase activity slowly   Complete by: As directed      Allergies as of 01/25/2021      Reactions   Levofloxacin Shortness Of Breath   Adhesive [tape] Other (See Comments)   Other Rash   Plastic tape      Medication List    STOP taking these medications   amLODipine 5 MG tablet Commonly known as: NORVASC   furosemide 20 MG tablet Commonly known as: LASIX     TAKE these medications   albuterol 108 (90 Base) MCG/ACT inhaler Commonly known as: VENTOLIN HFA Inhale 1-2 puffs into the lungs every 6 (six) hours as needed for wheezing or shortness of breath.   bisacodyl 5 MG EC tablet Commonly known as: DULCOLAX Take 1 tablet (5 mg total) by mouth daily as needed for moderate constipation.   bisacodyl 10 MG suppository Commonly known as: DULCOLAX Place 1 suppository (10 mg total) rectally daily as needed for severe constipation.   citalopram 40 MG tablet Commonly known as: CELEXA Take 40 mg by mouth daily.   cyanocobalamin 1000 MCG tablet Take 1 tablet (1,000 mcg total) by mouth daily.   FeroSul 325 (65  FE) MG tablet Generic drug: ferrous sulfate Take 325 mg by mouth every morning.   Fluticasone-Salmeterol 250-50 MCG/DOSE Aepb Commonly known as: Advair Diskus Inhale 1 puff into the lungs 2 (two) times daily.   folic acid 1 MG tablet Commonly known as: FOLVITE Take 1 tablet (1 mg total) by mouth daily.   gabapentin 100 MG capsule Commonly known as: NEURONTIN Take 2 capsules (200 mg total) by mouth at bedtime.   levothyroxine 112 MCG tablet Commonly known as: SYNTHROID Take 112 mcg by mouth daily before breakfast.   metoprolol tartrate 25 MG tablet Commonly known as: LOPRESSOR Take 1 tablet (25 mg total) by mouth 2 (two) times daily. What changed:   medication strength  how much to take   multivitamin tablet Take 1 tablet by mouth daily. Reported on 04/05/2016   omeprazole 20 MG capsule Commonly known as: PRILOSEC Take 20 mg by mouth daily.   potassium chloride SA 20 MEQ tablet Commonly known as: KLOR-CON Take 20 mEq by mouth daily.   senna-docusate 8.6-50 MG tablet Commonly known as: Senokot-S Take 1 tablet by mouth daily.   tiotropium 18 MCG inhalation capsule Commonly  known as: Spiriva HandiHaler Place 1 capsule (18 mcg total) into inhaler and inhale daily.   traZODone 100 MG tablet Commonly known as: DESYREL Take 100 mg by mouth at bedtime.   Vitamin D 50 MCG (2000 UT) tablet Take 2,000 Units by mouth daily.            Durable Medical Equipment  (From admission, onward)         Start     Ordered   01/23/21 0826  For home use only DME oxygen  Once       Question Answer Comment  Length of Need Lifetime   Mode or (Route) Nasal cannula   Liters per Minute 4   Frequency Continuous (stationary and portable oxygen unit needed)   Oxygen delivery system Gas      01/23/21 0825          Follow-up Information    Lequita Asal, MD. Call.   Specialty: Hematology and Oncology Why: Referral for iron infusions and follow for severe iron  deficiency. Contact information: [place] [address] [phone]        Baxter Hire, MD. Schedule an appointment as soon as possible for a visit in 1 week(s).   Specialty: Internal Medicine Why: Hospital Follow up Contact information: [place] [address] [phone]        Lin Landsman, MD. Schedule an appointment as soon as possible for a visit in 1 week(s).   Specialty: Gastroenterology Why: Follow up for video capsule endoscopy study. Contact information: [place] [address] [phone]              Allergies  Allergen Reactions  . Levofloxacin Shortness Of Breath  . Adhesive [Tape] Other (See Comments)  . Other Rash    Plastic tape     If you experience worsening of your admission symptoms, develop shortness of breath, life threatening emergency, suicidal or homicidal thoughts you must seek medical attention immediately by calling 911 or calling your MD immediately  if symptoms less severe.    Please note   You were cared for by a hospitalist during your hospital stay. If you have any questions about your discharge medications or the care you received while you were in the hospital after you are discharged, you can call the unit and asked to speak with the hospitalist on call if the hospitalist that took care of you is not available. Once you are discharged, your primary care physician will handle any further medical issues. Please note that NO REFILLS for any discharge medications will be authorized once you are discharged, as it is imperative that you return to your primary care physician (or establish a relationship with a primary care physician if you do not have one) for your aftercare needs so that they can reassess your need for medications and monitor your lab values.   Consultations:  GI    Procedures/Studies: CT HEAD WO CONTRAST  Result Date: 01/21/2021 CLINICAL  DATA:  Stroke symptoms EXAM: CT HEAD WITHOUT CONTRAST TECHNIQUE: Contiguous axial images were obtained from the base of the skull through the vertex without intravenous contrast. COMPARISON:  04/27/2019, 04/21/2019 FINDINGS: Brain: Stable atrophy and chronic white matter microvascular ischemic changes throughout both cerebral hemispheres. No definite acute intracranial hemorrhage, mass lesion, new infarction, midline shift, herniation, hydrocephalus, or extra-axial fluid collection. Cisterns are patent. Cerebellar atrophy as well. Vascular: Intracranial atherosclerosis at the skull base. No hyperdense vessel.  Skull: Normal. Negative for fracture or focal lesion. Sinuses/Orbits: Orbits are symmetric. Mild chronic sinus mucosal thickening. Postop changes of the left maxillary sinus. Mastoids are clear. Other: None. IMPRESSION: Stable atrophy and chronic white matter microvascular ischemic changes. No acute intracranial abnormality by noncontrast CT. Chronic sinus changes again noted. Electronically Signed   By: Jerilynn Mages.  Shick M.D.   On: 01/21/2021 17:29   CT ANGIO CHEST PE W OR WO CONTRAST  Result Date: 01/22/2021 CLINICAL DATA:  PE suspected, high prob Acute respiratory failure with hypoxia. EXAM: CT ANGIOGRAPHY CHEST WITH CONTRAST TECHNIQUE: Multidetector CT imaging of the chest was performed using the standard protocol during bolus administration of intravenous contrast. Multiplanar CT image reconstructions and MIPs were obtained to evaluate the vascular anatomy. CONTRAST:  76mL OMNIPAQUE IOHEXOL 350 MG/ML SOLN COMPARISON:  Chest CTA 07/29/2020.  No interval chest imaging. FINDINGS: Cardiovascular: Basilar assessment is limited due to motion artifact, additionally left basilar assessment is further limited due to chronic compressive atelectasis related to large hiatal hernia. Allowing for these limitations, no evidence of pulmonary embolus. Aortic atherosclerosis and tortuosity. Left vertebral artery arises directly  from the thoracic aorta, variant anatomy. No acute aortic findings. Heart size upper normal. No pericardial effusion. Mediastinum/Nodes: Large hiatal hernia with greater than 50% of the stomach being intrathoracic. Thyroid gland is obscured by streak artifact from spinal hardware. There is a prominent anterior paratracheal node measuring 8 mm, series 4, image 25. No right hilar adenopathy. No convincing left hilar adenopathy, hilar evaluation partially obscured by adjacent opacity. Lungs/Pleura: Spiculated left upper lobe pulmonary nodule measures 1.7 x 1.3 cm, series 6, image 20, previously 1.4 x 0.9 cm, suspicious for primary bronchogenic malignancy. Increased left basilar atelectasis, much of which is compressive related to large hiatal hernia. There is a trace left pleural effusion. No pendant and linear atelectasis in the right lower lobe with minimal right pleural thickening, but no discrete effusion. Breathing motion artifact limits detailed assessment. Heterogeneous pulmonary parenchyma may represent small airways disease. Upper Abdomen: Greater than 50% of the stomach is intrathoracic with large hiatal hernia. Cholecystectomy. Motion artifact through the upper abdomen limits detailed assessment. Probable ventral abdominal wall hernia repair with mesh. Musculoskeletal: Surgical hardware in the lower cervical spine. Chronic T11 compression fracture. Callus formation about left anterior third rib, chronic. No evidence of focal bone lesion. Left shoulder arthroplasty. Advanced right glenohumeral osteoarthritis. Review of the MIP images confirms the above findings. IMPRESSION: 1. No evidence of pulmonary embolus allowing for motion artifact. 2. Spiculated left upper lobe pulmonary nodule measuring 1.7 x 1.3 cm, previously 1.4 x 0.9 cm, suspicious for primary bronchogenic malignancy. Recommend further characterization with PET CT. 3. Increased left basilar atelectasis, much of which is compressive related to  large hiatal hernia. Trace left pleural effusion. 4. Heterogeneous pulmonary parenchyma may represent small airways disease. 5. Prominent anterior paratracheal node is nonspecific. Aortic Atherosclerosis (ICD10-I70.0). Electronically Signed   By: Keith Rake M.D.   On: 01/22/2021 18:55     EGD Colonoscopy  Subjective: Pt seen this AM reports feeling well, no acute complaints.  Eager to be out of hospital.  Had already been given breakfast early this AM, so video study unable to be done.  Denies F/C, N/V/D, cough, CP, SOB or other complaints.    Discharge Exam: Vitals:   01/25/21 0816 01/25/21 1143  BP: 123/62 120/72  Pulse: 79 73  Resp: 18   Temp: 98.1 F (36.7 C)   SpO2: 92% 99%   Vitals:  01/25/21 0010 01/25/21 0457 01/25/21 0816 01/25/21 1143  BP: 102/67 100/67 123/62 120/72  Pulse: 80 77 79 73  Resp: 20 16 18    Temp: 98.4 F (36.9 C) 98.4 F (36.9 C) 98.1 F (36.7 C)   TempSrc: Oral  Oral   SpO2: 94% 92% 92% 99%  Weight:      Height:        General: Pt is alert, awake, not in acute distress, obese Cardiovascular: RRR, S1/S2 +, no rubs, no gallops Respiratory: CTA bilaterally, no wheezing, no rhonchi, on 2 L/min O2 via [place] Abdominal: Soft, NT, ND, bowel sounds + Extremities: no edema, no cyanosis    The results of significant diagnostics from this hospitalization (including imaging, microbiology, ancillary and laboratory) are listed below for reference.     Microbiology: Recent Results (from the past 240 hour(s))  Resp Panel by RT-PCR (Flu A&B, Covid) Nasopharyngeal Swab     Status: None   Collection Time: 01/20/21  2:40 PM   Specimen: Nasopharyngeal Swab; Nasopharyngeal(NP) swabs in vial transport medium  Result Value Ref Range Status   SARS Coronavirus 2 by RT PCR NEGATIVE NEGATIVE Final    Comment: (NOTE) SARS-CoV-2 target nucleic acids are NOT DETECTED.  The SARS-CoV-2 RNA is generally detectable in upper respiratory specimens during the acute  phase of infection. The lowest concentration of SARS-CoV-2 viral copies this assay can detect is 138 copies/mL. A negative result does not preclude SARS-Cov-2 infection and should not be used as the sole basis for treatment or other patient management decisions. A negative result may occur with  improper specimen collection/handling, submission of specimen other than nasopharyngeal swab, presence of viral mutation(s) within the areas targeted by this assay, and inadequate number of viral copies(<138 copies/mL). A negative result must be combined with clinical observations, patient history, and epidemiological information. The expected result is Negative.  Fact Sheet for Patients:  EntrepreneurPulse.com.au  Fact Sheet for Healthcare Providers:  IncredibleEmployment.be  This test is no t yet approved or cleared by the [place] FDA and  has been authorized for detection and/or diagnosis of SARS-CoV-2 by FDA under an Emergency Use Authorization (EUA). This EUA will remain  in effect (meaning this test can be used) for the duration of the COVID-19 declaration under Section 564(b)(1) of the Act, 21 U.S.C.section 360bbb-3(b)(1), unless the authorization is terminated  or revoked sooner.       Influenza A by PCR NEGATIVE NEGATIVE Final   Influenza B by PCR NEGATIVE NEGATIVE Final    Comment: (NOTE) The Xpert Xpress SARS-CoV-2/FLU/RSV plus assay is intended as an aid in the diagnosis of influenza from Nasopharyngeal swab specimens and should not be used as a sole basis for treatment. Nasal washings and aspirates are unacceptable for Xpert Xpress SARS-CoV-2/FLU/RSV testing.  Fact Sheet for Patients: EntrepreneurPulse.com.au  Fact Sheet for Healthcare Providers: IncredibleEmployment.be  This test is not yet approved or cleared by the [place] FDA and has been authorized for detection and/or diagnosis of  SARS-CoV-2 by FDA under an Emergency Use Authorization (EUA). This EUA will remain in effect (meaning this test can be used) for the duration of the COVID-19 declaration under Section 564(b)(1) of the Act, 21 U.S.C. section 360bbb-3(b)(1), unless the authorization is terminated or revoked.  Performed at [facility], [address] [place], [place] [address]   Resp Panel by RT-PCR (Flu A&B, Covid) Nasopharyngeal Swab     Status: None   Collection Time: 01/24/21  3:11 PM   Specimen: Nasopharyngeal Swab;  Nasopharyngeal(NP) swabs in vial transport medium  Result Value Ref Range Status   SARS Coronavirus 2 by RT PCR NEGATIVE NEGATIVE Final    Comment: (NOTE) SARS-CoV-2 target nucleic acids are NOT DETECTED.  The SARS-CoV-2 RNA is generally detectable in upper respiratory specimens during the acute phase of infection. The lowest concentration of SARS-CoV-2 viral copies this assay can detect is 138 copies/mL. A negative result does not preclude SARS-Cov-2 infection and should not be used as the sole basis for treatment or other patient management decisions. A negative result may occur with  improper specimen collection/handling, submission of specimen other than nasopharyngeal swab, presence of viral mutation(s) within the areas targeted by this assay, and inadequate number of viral copies(<138 copies/mL). A negative result must be combined with clinical observations, patient history, and epidemiological information. The expected result is Negative.  Fact Sheet for Patients:  EntrepreneurPulse.com.au  Fact Sheet for Healthcare Providers:  IncredibleEmployment.be  This test is no t yet approved or cleared by the [place] FDA and  has been authorized for detection and/or diagnosis of SARS-CoV-2 by FDA under an Emergency Use Authorization (EUA). This EUA will remain  in effect (meaning this test can be used) for the duration of  the COVID-19 declaration under Section 564(b)(1) of the Act, 21 U.S.C.section 360bbb-3(b)(1), unless the authorization is terminated  or revoked sooner.       Influenza A by PCR NEGATIVE NEGATIVE Final   Influenza B by PCR NEGATIVE NEGATIVE Final    Comment: (NOTE) The Xpert Xpress SARS-CoV-2/FLU/RSV plus assay is intended as an aid in the diagnosis of influenza from Nasopharyngeal swab specimens and should not be used as a sole basis for treatment. Nasal washings and aspirates are unacceptable for Xpert Xpress SARS-CoV-2/FLU/RSV testing.  Fact Sheet for Patients: EntrepreneurPulse.com.au  Fact Sheet for Healthcare Providers: IncredibleEmployment.be  This test is not yet approved or cleared by the [place] FDA and has been authorized for detection and/or diagnosis of SARS-CoV-2 by FDA under an Emergency Use Authorization (EUA). This EUA will remain in effect (meaning this test can be used) for the duration of the COVID-19 declaration under Section 564(b)(1) of the Act, 21 U.S.C. section 360bbb-3(b)(1), unless the authorization is terminated or revoked.  Performed at [facility], [address] [place], [place] [address]      Labs: BNP (last 3 results) Recent Labs    03/19/20 2230 03/20/20 0723 01/20/21 1323  BNP 87.8 174.4* 51.0   Basic Metabolic Panel: Recent Labs  Lab 01/20/21 1325 01/21/21 0526 01/22/21 0453 01/23/21 0510  NA 137 138 138 134*  K 4.1 3.6 3.7 3.8  CL 99 100 101 96*  CO2 29 30 32 32  GLUCOSE 115* 104* 94 99  BUN 12 8 7* 6*  CREATININE 0.80 0.67 0.57 0.59  CALCIUM 8.9 8.8* 8.4* 8.4*  MG  --   --  2.4  --    Liver Function Tests: Recent Labs  Lab 01/20/21 1325  AST 16  ALT 10  ALKPHOS 71  BILITOT 0.5  PROT 6.4*  ALBUMIN 3.1*   No results for input(s): LIPASE, AMYLASE in the last 168 hours. No results for input(s): AMMONIA in the last 168 hours. CBC: Recent Labs  Lab  01/20/21 1325 01/20/21 1444 01/21/21 0526 01/21/21 1006 01/21/21 1416 01/22/21 0453 01/22/21 1143 01/23/21 0510 01/25/21 0448  WBC 6.8   < > 7.8 7.7 8.2 8.4  --  9.2  --   NEUTROABS 4.8  --   --   --   --   --   --   --   --  HGB 6.5*   < > 9.4* 9.7* 9.4* 8.3* 8.8* 8.6* 9.7*  HCT 22.7*   < > 30.2* 31.2* 31.9* 27.6* 28.4* 28.6* 32.1*  MCV 82.5   < > 84.8 86.2 87.4 86.5  --  86.1  --   PLT 322   < > 281 261 248 254  --  258  --    < > = values in this interval not displayed.   Cardiac Enzymes: No results for input(s): CKTOTAL, CKMB, CKMBINDEX, TROPONINI in the last 168 hours. BNP: Invalid input(s): POCBNP CBG: No results for input(s): GLUCAP in the last 168 hours. D-Dimer Recent Labs    01/22/21 1253  DDIMER 2.17*   Hgb A1c No results for input(s): HGBA1C in the last 72 hours. Lipid Profile No results for input(s): CHOL, HDL, LDLCALC, TRIG, CHOLHDL, LDLDIRECT in the last 72 hours. Thyroid function studies No results for input(s): TSH, T4TOTAL, T3FREE, THYROIDAB in the last 72 hours.  Invalid input(s): FREET3 Anemia work up No results for input(s): VITAMINB12, FOLATE, FERRITIN, TIBC, IRON, RETICCTPCT in the last 72 hours. Urinalysis    Component Value Date/Time   COLORURINE YELLOW (A) 03/19/2020 0918   APPEARANCEUR CLEAR (A) 03/19/2020 0918   APPEARANCEUR Clear 03/05/2014 1731   LABSPEC 1.015 03/19/2020 0918   LABSPEC 1.009 03/05/2014 1731   PHURINE 5.0 03/19/2020 0918   GLUCOSEU NEGATIVE 03/19/2020 0918   GLUCOSEU Negative 03/05/2014 1731   HGBUR NEGATIVE 03/19/2020 0918   BILIRUBINUR NEGATIVE 03/19/2020 0918   BILIRUBINUR Negative 03/05/2014 1731   KETONESUR NEGATIVE 03/19/2020 0918   PROTEINUR NEGATIVE 03/19/2020 0918   NITRITE NEGATIVE 03/19/2020 0918   LEUKOCYTESUR NEGATIVE 03/19/2020 0918   LEUKOCYTESUR Negative 03/05/2014 1731   Sepsis Labs Invalid input(s): PROCALCITONIN,  WBC,  LACTICIDVEN Microbiology Recent Results (from the past 240 hour(s))   Resp Panel by RT-PCR (Flu A&B, Covid) Nasopharyngeal Swab     Status: None   Collection Time: 01/20/21  2:40 PM   Specimen: Nasopharyngeal Swab; Nasopharyngeal(NP) swabs in vial transport medium  Result Value Ref Range Status   SARS Coronavirus 2 by RT PCR NEGATIVE NEGATIVE Final    Comment: (NOTE) SARS-CoV-2 target nucleic acids are NOT DETECTED.  The SARS-CoV-2 RNA is generally detectable in upper respiratory specimens during the acute phase of infection. The lowest concentration of SARS-CoV-2 viral copies this assay can detect is 138 copies/mL. A negative result does not preclude SARS-Cov-2 infection and should not be used as the sole basis for treatment or other patient management decisions. A negative result may occur with  improper specimen collection/handling, submission of specimen other than nasopharyngeal swab, presence of viral mutation(s) within the areas targeted by this assay, and inadequate number of viral copies(<138 copies/mL). A negative result must be combined with clinical observations, patient history, and epidemiological information. The expected result is Negative.  Fact Sheet for Patients:  EntrepreneurPulse.com.au  Fact Sheet for Healthcare Providers:  IncredibleEmployment.be  This test is no t yet approved or cleared by the [place] FDA and  has been authorized for detection and/or diagnosis of SARS-CoV-2 by FDA under an Emergency Use Authorization (EUA). This EUA will remain  in effect (meaning this test can be used) for the duration of the COVID-19 declaration under Section 564(b)(1) of the Act, 21 U.S.C.section 360bbb-3(b)(1), unless the authorization is terminated  or revoked sooner.       Influenza A by PCR NEGATIVE NEGATIVE Final   Influenza B by PCR NEGATIVE NEGATIVE Final    Comment: (NOTE) The Xpert  Xpress SARS-CoV-2/FLU/RSV plus assay is intended as an aid in the diagnosis of influenza from  Nasopharyngeal swab specimens and should not be used as a sole basis for treatment. Nasal washings and aspirates are unacceptable for Xpert Xpress SARS-CoV-2/FLU/RSV testing.  Fact Sheet for Patients: EntrepreneurPulse.com.au  Fact Sheet for Healthcare Providers: IncredibleEmployment.be  This test is not yet approved or cleared by the [place] FDA and has been authorized for detection and/or diagnosis of SARS-CoV-2 by FDA under an Emergency Use Authorization (EUA). This EUA will remain in effect (meaning this test can be used) for the duration of the COVID-19 declaration under Section 564(b)(1) of the Act, 21 U.S.C. section 360bbb-3(b)(1), unless the authorization is terminated or revoked.  Performed at [facility], [address] [place], [place] [address]   Resp Panel by RT-PCR (Flu A&B, Covid) Nasopharyngeal Swab     Status: None   Collection Time: 01/24/21  3:11 PM   Specimen: Nasopharyngeal Swab; Nasopharyngeal(NP) swabs in vial transport medium  Result Value Ref Range Status   SARS Coronavirus 2 by RT PCR NEGATIVE NEGATIVE Final    Comment: (NOTE) SARS-CoV-2 target nucleic acids are NOT DETECTED.  The SARS-CoV-2 RNA is generally detectable in upper respiratory specimens during the acute phase of infection. The lowest concentration of SARS-CoV-2 viral copies this assay can detect is 138 copies/mL. A negative result does not preclude SARS-Cov-2 infection and should not be used as the sole basis for treatment or other patient management decisions. A negative result may occur with  improper specimen collection/handling, submission of specimen other than nasopharyngeal swab, presence of viral mutation(s) within the areas targeted by this assay, and inadequate number of viral copies(<138 copies/mL). A negative result must be combined with clinical observations, patient history, and epidemiological information. The expected  result is Negative.  Fact Sheet for Patients:  EntrepreneurPulse.com.au  Fact Sheet for Healthcare Providers:  IncredibleEmployment.be  This test is no t yet approved or cleared by the [place] FDA and  has been authorized for detection and/or diagnosis of SARS-CoV-2 by FDA under an Emergency Use Authorization (EUA). This EUA will remain  in effect (meaning this test can be used) for the duration of the COVID-19 declaration under Section 564(b)(1) of the Act, 21 U.S.C.section 360bbb-3(b)(1), unless the authorization is terminated  or revoked sooner.       Influenza A by PCR NEGATIVE NEGATIVE Final   Influenza B by PCR NEGATIVE NEGATIVE Final    Comment: (NOTE) The Xpert Xpress SARS-CoV-2/FLU/RSV plus assay is intended as an aid in the diagnosis of influenza from Nasopharyngeal swab specimens and should not be used as a sole basis for treatment. Nasal washings and aspirates are unacceptable for Xpert Xpress SARS-CoV-2/FLU/RSV testing.  Fact Sheet for Patients: EntrepreneurPulse.com.au  Fact Sheet for Healthcare Providers: IncredibleEmployment.be  This test is not yet approved or cleared by the [place] FDA and has been authorized for detection and/or diagnosis of SARS-CoV-2 by FDA under an Emergency Use Authorization (EUA). This EUA will remain in effect (meaning this test can be used) for the duration of the COVID-19 declaration under Section 564(b)(1) of the Act, 21 U.S.C. section 360bbb-3(b)(1), unless the authorization is terminated or revoked.  Performed at [facility], [address] [place], [place] [address]      Time coordinating discharge: Over 30 minutes  SIGNED:   Ezekiel Slocumb, DO [facility] 01/25/2021, 12:21 PM   If 7PM-7AM, please contact night-coverage [email or web address]

## 2021-01-25 NOTE — Plan of Care (Signed)

## 2021-01-25 NOTE — TOC Progression Note (Signed)
Transition of Care [facility]) - Progression Note    Patient Details  Name: [name] MRN: [id number] Date of Birth: January 16, 1946  [facility]) CM/SW Contact  Beverly Sessions, RN Phone Number: 01/25/2021, 9:36 AM  Clinical Narrative:    Plan auth ID # [id number]   Expected Discharge Plan: [place] Barriers to Discharge: Continued Medical Work up  Expected Discharge Plan and Services Expected Discharge Plan: [place]   Discharge Planning Services: CM Consult Post Acute Care Choice: [place] arrangements for the past 2 months: Single Family Home                           HH Arranged: PT,OT,Nurse's Aide           Social Determinants of Health (SDOH) Interventions    Readmission Risk Interventions No flowsheet data found.

## 2021-01-25 NOTE — TOC Transition Note (Signed)
Transition of Care [facility]) - CM/SW Discharge Note   Patient Details  Name: [name] MRN: [id number] Date of Birth: 1946/09/11  Transition of Care [facility]) CM/SW Contact:  Beverly Sessions, RN Phone Number: 01/25/2021, 1:33 PM   Clinical Narrative:     Patient to discharge to [place] [facility] today [place] sent in hub Son updated EMS packet on chart Bedside RN to call report EMS transport called  Final next level of care: Skilled Nursing Facility Barriers to Discharge: No Barriers Identified   Patient Goals and CMS Choice     Choice offered to / list presented to : [place]  Discharge Placement              Patient chooses bed at: [facility] Patient to be transferred to facility by: EMS Name of family member notified: son Patient and family notified of of transfer: 01/25/21  Discharge Plan and Services   Discharge Planning Services: CM Consult Post Acute Care Choice: Home Health                    [facility] Arranged: PT,OT,Nurse's Aide          Social Determinants of Health (SDOH) Interventions     Readmission Risk Interventions No flowsheet data found.

## 2021-01-25 NOTE — Discharge Summary (Signed)
Physician Discharge Summary  [name]:[id number] DOB: 1946-07-30 DOA: 01/20/2021  PCP: Baxter Hire, MD  Admit date: 01/20/2021 Discharge date: 02/10/2021  Admitted From: home Disposition:  SNF  Recommendations for Outpatient Follow-up:  1. Follow up with PCP in 1-2 weeks 2. Please obtain BMP/CBC in one week 3. Please follow up with GI for outpatient video capsule study, this is not urgent as long as hemoglobin stable 4. Follow up with hematology for iron deficiency anemia and IV iron transfusions if needed  Home Health: No  Equipment/Devices: None   Discharge Condition: Stable  CODE STATUS: Full  Diet recommendation: Heart Healthy / Carb modified  Discharge Diagnoses: Principal Problem:   Symptomatic anemia Active Problems:   HTN (hypertension)   Iron deficiency anemia   Acute respiratory failure with hypoxia (HCC)   GI bleeding   GERD (gastroesophageal reflux disease)   Hypothyroidism   Depression   COPD (chronic obstructive pulmonary disease) (HCC)   Hiatal hernia   Left upper lobe pulmonary nodule    Summary of HPI and Hospital Course:  [name] is a 75 y.o. female with medical history significant of HTN, COPD, GERD, hypothyroidism, depression, iron deficiency anemia, degenerative disc disease, RLS, who presented to the ED on 01/20/21, referred by her PCP due to finding of low hemoglobin on recent labs.  Patient was otherwise asymptomatic without melena, hemaochezia or other sign of blood loss.  She notably has history of iron deficiency anemia requiring iron transfusions.  Admitted and was transfused 2 units pRBC's, in addition to iron infusion.  GI was consulted and patient underwent EGD and colonoscopy which were unremarkable, no bleeding source was identified.    Patient is generally very weak.  PT recommended SNF for short term rehab prior to returning home.   Patient is clinical improved and stable for d/c to SNF today.    Symptomatic Anemia  / History of Iron Deficiency / ?GI Bleeding -  GI consulted appreciate recommendations: --Follow antireflux diet indefinitely --Prilosec 40 mg p.o. twice daily --Video capsule endoscopy - as outpatient, non-urgent if Hbg remains stable --Hematology referral for outpatient  IV iron infusions --Repeat CBC in 1 week --Transfuse Hbg if less than 7.0 --Patient was given Venofer infusion x 2 during admission Hemoglobin is increasing and up to 9.7 today  Hypotension -etiology not clear.  Resolved but remains with normal and at times soft BP's with reduced dose of metoprolol and holding amlodipine. --Stopped amlodipine at d/c --Continue reduced dose of metoprolol 25 mg (was 100 mg)  Acute respiratory failure with hypoxia - O2 requirement has been 2 L/min, not on home O2.  --Supplement O2 as needed to keep sats > 88%, wean as tolerated --D-dimer elevated --CTA chest negative for PE, large hiatal hernia with compressive atelectasis, LUL spiculated nodule  Pulmonary Nodule - with smoking hx and spiculated appearing nodule on CTA chest, concern for malignancy.  PET/CT was recommended for further evaluation. --Close outpatient follow up --Referring to hematology/oncology for IDA as well  Altered mental status - resolved. Mostly likely was from sedation for endoscopy, resolved on its own.  Daughter reported 3/25 afternoon patient's speech is abnormal with words in the wrong order and slow word finding.  Says this is very atypical.  Monitored with neurochecks and no changes were noted. --CT head negative  Generalized weakness -nursing note patient to be very unsteady on her feet this morning.  PT evaluated and recommended SNF.  Patient declines but is excepting of home health.  TOC following. --Fall precautions --SNF for short term rehab  History of hypertension -currently hypotensive as above.  Continue holding Lasix, amlodipine. Metoprolol dose was reduced. --Close PCP follow up  GERD -  omeprazole 40 mg p.o. twice daily per GI  Hypothyroidism -continue Synthroid  Depression -continue home meds  COPD -seems overall stable.  Patient endorses chronic wheezing on and off.  Will monitor.  Continue bronchodilators.  Obesity: Body mass index is 41.79 kg/m.   Complicates overall care and prognosis.  Recommend lifestyle modifications including physical activity and diet for weight loss and overall long-term health.    Discharge Instructions   Discharge Instructions    Call MD for:  extreme fatigue   Complete by: As directed    Call MD for:  persistant dizziness or light-headedness   Complete by: As directed    Call MD for:  persistant nausea and vomiting   Complete by: As directed    Call MD for:  severe uncontrolled pain   Complete by: As directed    Call MD for:  temperature >100.4   Complete by: As directed    Increase activity slowly   Complete by: As directed    Increase activity slowly   Complete by: As directed      Allergies as of 01/25/2021      Reactions   Levofloxacin Shortness Of Breath   Adhesive [tape] Other (See Comments)   Other Rash   Plastic tape      Medication List    STOP taking these medications   amLODipine 5 MG tablet Commonly known as: NORVASC   furosemide 20 MG tablet Commonly known as: LASIX     TAKE these medications   albuterol 108 (90 Base) MCG/ACT inhaler Commonly known as: VENTOLIN HFA Inhale 1-2 puffs into the lungs every 6 (six) hours as needed for wheezing or shortness of breath.   bisacodyl 5 MG EC tablet Commonly known as: DULCOLAX Take 1 tablet (5 mg total) by mouth daily as needed for moderate constipation.   bisacodyl 10 MG suppository Commonly known as: DULCOLAX Place 1 suppository (10 mg total) rectally daily as needed for severe constipation.   citalopram 40 MG tablet Commonly known as: CELEXA Take 40 mg by mouth daily.   cyanocobalamin 1000 MCG tablet Take 1 tablet (1,000 mcg total) by mouth  daily.   FeroSul 325 (65 FE) MG tablet Generic drug: ferrous sulfate Take 325 mg by mouth every morning.   Fluticasone-Salmeterol 250-50 MCG/DOSE Aepb Commonly known as: Advair Diskus Inhale 1 puff into the lungs 2 (two) times daily.   folic acid 1 MG tablet Commonly known as: FOLVITE Take 1 tablet (1 mg total) by mouth daily.   gabapentin 100 MG capsule Commonly known as: NEURONTIN Take 2 capsules (200 mg total) by mouth at bedtime.   levothyroxine 112 MCG tablet Commonly known as: SYNTHROID Take 112 mcg by mouth daily before breakfast.   metoprolol tartrate 25 MG tablet Commonly known as: LOPRESSOR Take 1 tablet (25 mg total) by mouth 2 (two) times daily. What changed:   medication strength  how much to take   multivitamin tablet Take 1 tablet by mouth daily. Reported on 04/05/2016   omeprazole 20 MG capsule Commonly known as: PRILOSEC Take 20 mg by mouth daily.   potassium chloride SA 20 MEQ tablet Commonly known as: KLOR-CON Take 20 mEq by mouth daily.   senna-docusate 8.6-50 MG tablet Commonly known as: Senokot-S Take 1 tablet by mouth daily.   tiotropium  18 MCG inhalation capsule Commonly known as: Spiriva HandiHaler Place 1 capsule (18 mcg total) into inhaler and inhale daily.   traZODone 100 MG tablet Commonly known as: DESYREL Take 100 mg by mouth at bedtime.   Vitamin D 50 MCG (2000 UT) tablet Take 2,000 Units by mouth daily.       Follow-up Information    Baxter Hire, MD. Schedule an appointment as soon as possible for a visit in 1 week(s).   Specialty: Internal Medicine Why: Hospital Follow up Contact information: [place] [address] [phone]        Lin Landsman, MD. Schedule an appointment as soon as possible for a visit in 1 week(s).   Specialty: Gastroenterology Why: Follow up for video capsule endoscopy study. Contact information: [place]  [address] [phone]        Sindy Guadeloupe, MD. Schedule an appointment as soon as possible for a visit in 2 week(s).   Specialty: Oncology Why: Outpatient follow for iron deficiency anemia after hospital admission requiring RBC transfusion. Contact information: [address] [place] [address] [phone]              Allergies  Allergen Reactions  . Levofloxacin Shortness Of Breath  . Adhesive [Tape] Other (See Comments)  . Other Rash    Plastic tape     If you experience worsening of your admission symptoms, develop shortness of breath, life threatening emergency, suicidal or homicidal thoughts you must seek medical attention immediately by calling 911 or calling your MD immediately  if symptoms less severe.    Please note   You were cared for by a hospitalist during your hospital stay. If you have any questions about your discharge medications or the care you received while you were in the hospital after you are discharged, you can call the unit and asked to speak with the hospitalist on call if the hospitalist that took care of you is not available. Once you are discharged, your primary care physician will handle any further medical issues. Please note that NO REFILLS for any discharge medications will be authorized once you are discharged, as it is imperative that you return to your primary care physician (or establish a relationship with a primary care physician if you do not have one) for your aftercare needs so that they can reassess your need for medications and monitor your lab values.   Consultations:  GI    Procedures/Studies: CT HEAD WO CONTRAST  Result Date: 01/21/2021 CLINICAL DATA:  Stroke symptoms EXAM: CT HEAD WITHOUT CONTRAST TECHNIQUE: Contiguous axial images were obtained from the base of the skull through the vertex without intravenous contrast. COMPARISON:  04/27/2019, 04/21/2019 FINDINGS: Brain: Stable atrophy and chronic white matter  microvascular ischemic changes throughout both cerebral hemispheres. No definite acute intracranial hemorrhage, mass lesion, new infarction, midline shift, herniation, hydrocephalus, or extra-axial fluid collection. Cisterns are patent. Cerebellar atrophy as well. Vascular: Intracranial atherosclerosis at the skull base. No hyperdense vessel. Skull: Normal. Negative for fracture or focal lesion. Sinuses/Orbits: Orbits are symmetric. Mild chronic sinus mucosal thickening. Postop changes of the left maxillary sinus. Mastoids are clear. Other: None. IMPRESSION: Stable atrophy and chronic white matter microvascular ischemic changes. No acute intracranial abnormality by noncontrast CT. Chronic sinus changes again noted. Electronically Signed   By: Jerilynn Mages.  Shick M.D.   On: 01/21/2021 17:29   CT ANGIO CHEST PE W OR WO CONTRAST  Result Date: 01/22/2021 CLINICAL DATA:  PE suspected, high prob Acute respiratory failure with hypoxia. EXAM: CT ANGIOGRAPHY CHEST WITH CONTRAST TECHNIQUE: Multidetector CT imaging of the chest was performed using the standard protocol during bolus administration of intravenous contrast. Multiplanar CT image reconstructions and MIPs were obtained to evaluate the vascular anatomy. CONTRAST:  12mL OMNIPAQUE IOHEXOL 350 MG/ML SOLN COMPARISON:  Chest CTA 07/29/2020.  No interval chest imaging. FINDINGS: Cardiovascular: Basilar assessment is limited due to motion artifact, additionally left basilar assessment is further limited due to chronic compressive atelectasis related to large hiatal hernia. Allowing for these limitations, no evidence of pulmonary embolus. Aortic atherosclerosis and tortuosity. Left vertebral artery arises directly from the thoracic aorta, variant anatomy. No acute aortic findings. Heart size upper normal. No pericardial effusion. Mediastinum/Nodes: Large hiatal hernia with greater than 50% of the stomach being intrathoracic. Thyroid gland is obscured by streak artifact from spinal  hardware. There is a prominent anterior paratracheal node measuring 8 mm, series 4, image 25. No right hilar adenopathy. No convincing left hilar adenopathy, hilar evaluation partially obscured by adjacent opacity. Lungs/Pleura: Spiculated left upper lobe pulmonary nodule measures 1.7 x 1.3 cm, series 6, image 20, previously 1.4 x 0.9 cm, suspicious for primary bronchogenic malignancy. Increased left basilar atelectasis, much of which is compressive related to large hiatal hernia. There is a trace left pleural effusion. No pendant and linear atelectasis in the right lower lobe with minimal right pleural thickening, but no discrete effusion. Breathing motion artifact limits detailed assessment. Heterogeneous pulmonary parenchyma may represent small airways disease. Upper Abdomen: Greater than 50% of the stomach is intrathoracic with large hiatal hernia. Cholecystectomy. Motion artifact through the upper abdomen limits detailed assessment. Probable ventral abdominal wall hernia repair with mesh. Musculoskeletal: Surgical hardware in the lower cervical spine. Chronic T11 compression fracture. Callus formation about left anterior third rib, chronic. No evidence of focal bone lesion. Left shoulder arthroplasty. Advanced right glenohumeral osteoarthritis. Review of the MIP images confirms the above findings. IMPRESSION: 1. No evidence of pulmonary embolus allowing for motion artifact. 2. Spiculated left upper lobe pulmonary nodule measuring 1.7 x 1.3 cm, previously 1.4 x 0.9 cm, suspicious for primary bronchogenic malignancy. Recommend further characterization with PET CT. 3. Increased left basilar atelectasis, much of which is compressive related to large hiatal hernia. Trace left pleural effusion. 4. Heterogeneous pulmonary parenchyma may represent small airways disease. 5. Prominent anterior paratracheal node is nonspecific. Aortic Atherosclerosis (ICD10-I70.0). Electronically Signed   By: Keith Rake M.D.   On:  01/22/2021 18:55     EGD Colonoscopy  Subjective: Pt seen this AM reports feeling well, no acute complaints.  Eager to be out of hospital.  Had already been given breakfast early this AM, so video study unable to be done.  Denies F/C, N/V/D, cough, CP, SOB or other complaints.    Discharge Exam: Vitals:   01/25/21 0816 01/25/21 1143  BP: 123/62 120/72  Pulse: 79 73  Resp: 18   Temp: 98.1 F (36.7 C)   SpO2: 92% 99%   Vitals:   01/25/21 0010 01/25/21 0457 01/25/21 0816 01/25/21 1143  BP: 102/67 100/67 123/62 120/72  Pulse: 80 77 79 73  Resp: 20 16 18    Temp: 98.4 F (36.9 C) 98.4 F (36.9 C) 98.1 F (36.7 C)   TempSrc: Oral  Oral   SpO2: 94% 92% 92% 99%  Weight:      Height:        General: Pt is alert, awake, not in acute distress, obese Cardiovascular: RRR, S1/S2 +,  no rubs, no gallops Respiratory: CTA bilaterally, no wheezing, no rhonchi, on 2 L/min O2 via [place] Abdominal: Soft, NT, ND, bowel sounds + Extremities: no edema, no cyanosis    The results of significant diagnostics from this hospitalization (including imaging, microbiology, ancillary and laboratory) are listed below for reference.     Microbiology: No results found for this or any previous visit (from the past 240 hour(s)).   Labs: BNP (last 3 results) Recent Labs    03/19/20 2230 03/20/20 0723 01/20/21 1323  BNP 87.8 174.4* 62.7   Basic Metabolic Panel: No results for input(s): NA, K, CL, CO2, GLUCOSE, BUN, CREATININE, CALCIUM, MG, PHOS in the last 168 hours. Liver Function Tests: No results for input(s): AST, ALT, ALKPHOS, BILITOT, PROT, ALBUMIN in the last 168 hours. No results for input(s): LIPASE, AMYLASE in the last 168 hours. No results for input(s): AMMONIA in the last 168 hours. CBC: No results for input(s): WBC, NEUTROABS, HGB, HCT, MCV, PLT in the last 168 hours. Cardiac Enzymes: No results for input(s): CKTOTAL, CKMB, CKMBINDEX, TROPONINI in the last 168 hours. BNP: Invalid  input(s): POCBNP CBG: No results for input(s): GLUCAP in the last 168 hours. D-Dimer No results for input(s): DDIMER in the last 72 hours. Hgb A1c No results for input(s): HGBA1C in the last 72 hours. Lipid Profile No results for input(s): CHOL, HDL, LDLCALC, TRIG, CHOLHDL, LDLDIRECT in the last 72 hours. Thyroid function studies No results for input(s): TSH, T4TOTAL, T3FREE, THYROIDAB in the last 72 hours.  Invalid input(s): FREET3 Anemia work up No results for input(s): VITAMINB12, FOLATE, FERRITIN, TIBC, IRON, RETICCTPCT in the last 72 hours. Urinalysis    Component Value Date/Time   COLORURINE YELLOW (A) 03/19/2020 0918   APPEARANCEUR CLEAR (A) 03/19/2020 0918   APPEARANCEUR Clear 03/05/2014 1731   LABSPEC 1.015 03/19/2020 0918   LABSPEC 1.009 03/05/2014 1731   PHURINE 5.0 03/19/2020 0918   GLUCOSEU NEGATIVE 03/19/2020 0918   GLUCOSEU Negative 03/05/2014 1731   HGBUR NEGATIVE 03/19/2020 0918   BILIRUBINUR NEGATIVE 03/19/2020 0918   BILIRUBINUR Negative 03/05/2014 1731   KETONESUR NEGATIVE 03/19/2020 0918   PROTEINUR NEGATIVE 03/19/2020 0918   NITRITE NEGATIVE 03/19/2020 0918   LEUKOCYTESUR NEGATIVE 03/19/2020 0918   LEUKOCYTESUR Negative 03/05/2014 1731   Sepsis Labs Invalid input(s): PROCALCITONIN,  WBC,  LACTICIDVEN Microbiology No results found for this or any previous visit (from the past 240 hour(s)).   Time coordinating discharge: Over 30 minutes  SIGNED:   Ezekiel Slocumb, DO [facility] 02/10/2021, 3:46 PM   If 7PM-7AM, please contact night-coverage [email or web address]

## 2021-01-27 ENCOUNTER — Ambulatory Visit: Admission: RE | Admit: 2021-01-27 | Payer: Medicare Other | Source: Home / Self Care | Admitting: Gastroenterology

## 2021-01-27 ENCOUNTER — Encounter: Admission: RE | Payer: Self-pay | Source: Home / Self Care

## 2021-01-27 SURGERY — IMAGING PROCEDURE, GI TRACT, INTRALUMINAL, VIA CAPSULE

## 2021-02-14 DIAGNOSIS — J9611 Chronic respiratory failure with hypoxia: Secondary | ICD-10-CM | POA: Insufficient documentation

## 2021-03-15 ENCOUNTER — Inpatient Hospital Stay: Payer: Medicare Other

## 2021-03-15 ENCOUNTER — Inpatient Hospital Stay: Payer: Medicare Other | Attending: Oncology | Admitting: Oncology

## 2021-03-15 ENCOUNTER — Encounter (INDEPENDENT_AMBULATORY_CARE_PROVIDER_SITE_OTHER): Payer: Self-pay

## 2021-03-15 VITALS — BP 111/76 | HR 66 | Temp 97.2°F | Ht <= 58 in | Wt 200.0 lb

## 2021-03-15 DIAGNOSIS — E669 Obesity, unspecified: Secondary | ICD-10-CM | POA: Diagnosis not present

## 2021-03-15 DIAGNOSIS — Z87891 Personal history of nicotine dependence: Secondary | ICD-10-CM | POA: Diagnosis not present

## 2021-03-15 DIAGNOSIS — E119 Type 2 diabetes mellitus without complications: Secondary | ICD-10-CM | POA: Diagnosis not present

## 2021-03-15 DIAGNOSIS — K219 Gastro-esophageal reflux disease without esophagitis: Secondary | ICD-10-CM | POA: Diagnosis not present

## 2021-03-15 DIAGNOSIS — I10 Essential (primary) hypertension: Secondary | ICD-10-CM | POA: Diagnosis not present

## 2021-03-15 DIAGNOSIS — Z79899 Other long term (current) drug therapy: Secondary | ICD-10-CM | POA: Insufficient documentation

## 2021-03-15 DIAGNOSIS — F329 Major depressive disorder, single episode, unspecified: Secondary | ICD-10-CM | POA: Insufficient documentation

## 2021-03-15 DIAGNOSIS — R531 Weakness: Secondary | ICD-10-CM | POA: Diagnosis not present

## 2021-03-15 DIAGNOSIS — K449 Diaphragmatic hernia without obstruction or gangrene: Secondary | ICD-10-CM | POA: Diagnosis not present

## 2021-03-15 DIAGNOSIS — E039 Hypothyroidism, unspecified: Secondary | ICD-10-CM | POA: Diagnosis not present

## 2021-03-15 DIAGNOSIS — D649 Anemia, unspecified: Secondary | ICD-10-CM

## 2021-03-15 DIAGNOSIS — R5383 Other fatigue: Secondary | ICD-10-CM | POA: Insufficient documentation

## 2021-03-15 DIAGNOSIS — D509 Iron deficiency anemia, unspecified: Secondary | ICD-10-CM | POA: Insufficient documentation

## 2021-03-15 DIAGNOSIS — E871 Hypo-osmolality and hyponatremia: Secondary | ICD-10-CM | POA: Diagnosis not present

## 2021-03-15 DIAGNOSIS — M5136 Other intervertebral disc degeneration, lumbar region: Secondary | ICD-10-CM | POA: Insufficient documentation

## 2021-03-15 DIAGNOSIS — R911 Solitary pulmonary nodule: Secondary | ICD-10-CM | POA: Insufficient documentation

## 2021-03-15 DIAGNOSIS — M5030 Other cervical disc degeneration, unspecified cervical region: Secondary | ICD-10-CM | POA: Diagnosis not present

## 2021-03-15 DIAGNOSIS — J449 Chronic obstructive pulmonary disease, unspecified: Secondary | ICD-10-CM | POA: Insufficient documentation

## 2021-03-15 LAB — CBC WITH DIFFERENTIAL/PLATELET
Abs Immature Granulocytes: 0.01 10*3/uL (ref 0.00–0.07)
Basophils Absolute: 0 10*3/uL (ref 0.0–0.1)
Basophils Relative: 1 %
Eosinophils Absolute: 0.1 10*3/uL (ref 0.0–0.5)
Eosinophils Relative: 1 %
HCT: 39.1 % (ref 36.0–46.0)
Hemoglobin: 11.9 g/dL — ABNORMAL LOW (ref 12.0–15.0)
Immature Granulocytes: 0 %
Lymphocytes Relative: 18 %
Lymphs Abs: 1.2 10*3/uL (ref 0.7–4.0)
MCH: 26.7 pg (ref 26.0–34.0)
MCHC: 30.4 g/dL (ref 30.0–36.0)
MCV: 87.9 fL (ref 80.0–100.0)
Monocytes Absolute: 0.7 10*3/uL (ref 0.1–1.0)
Monocytes Relative: 11 %
Neutro Abs: 4.3 10*3/uL (ref 1.7–7.7)
Neutrophils Relative %: 69 %
Platelets: 221 10*3/uL (ref 150–400)
RBC: 4.45 MIL/uL (ref 3.87–5.11)
RDW: 17.3 % — ABNORMAL HIGH (ref 11.5–15.5)
WBC: 6.3 10*3/uL (ref 4.0–10.5)
nRBC: 0 % (ref 0.0–0.2)

## 2021-03-15 LAB — IRON AND TIBC
Iron: 51 ug/dL (ref 28–170)
Saturation Ratios: 12 % (ref 10.4–31.8)
TIBC: 412 ug/dL (ref 250–450)
UIBC: 361 ug/dL

## 2021-03-15 LAB — RETICULOCYTES
Immature Retic Fract: 10.4 % (ref 2.3–15.9)
RBC.: 4.46 MIL/uL (ref 3.87–5.11)
Retic Count, Absolute: 36.6 10*3/uL (ref 19.0–186.0)
Retic Ct Pct: 0.8 % (ref 0.4–3.1)

## 2021-03-15 LAB — FOLATE: Folate: 32 ng/mL (ref >5.9)

## 2021-03-15 LAB — FERRITIN: Ferritin: 38 ng/mL (ref 11–307)

## 2021-03-15 LAB — VITAMIN B12: Vitamin B-12: 551 pg/mL (ref 180–914)

## 2021-03-15 NOTE — Progress Notes (Signed)
Hematology/Oncology Consult note [facility] [phone] [phone]  Patient Care Team: Baxter Hire, MD as PCP - General (Internal Medicine)   Name of the patient: [name]  [id number]  [birth year]    Reason for referral-lung nodule and iron deficiency anemia   Referring physician-Dr. Edwina Barth  Date of visit: 03/15/21   History of presenting illness- Patient is a 75 year old female with a past medical history significant for hypertension hypothyroidism COPD among other medical problems.  She was admitted to the hospital in March 2022 for worsening anemia and required 2 units of PRBC transfusion.  EGD and colonoscopy was otherwise unremarkable.  She was also started on home oxygen at that time and is currently on 4 L.  She had a CT angio chest PE done during that hospitalization which showed no evidence of PE but a spiculated left upper lobe lung nodule measuring 1.7 x 1.3 cm which was larger as compared to prior size of 1.4 x 0.9 cm 6 months ago.  During her hospitalization her ferritin levels were low at 4 folate and B12 were low as well.  H&H was 8.3/27.6 after blood transfusion.  She has subsequently had her CBC checked on 02/14/2021 when H&H was improved to 11/37.3.  Patient lives alone and needs some assistance with her ADLs.  Son lives close by.  She is with her granddaughter today.  ECOG PS- 2  Pain scale- 0   Review of systems- Review of Systems  Constitutional: Positive for malaise/fatigue. Negative for chills, fever and weight loss.  HENT: Negative for congestion, ear discharge and nosebleeds.   Eyes: Negative for blurred vision.  Respiratory: Positive for shortness of breath. Negative for cough, hemoptysis, sputum production and wheezing.   Cardiovascular: Negative for chest pain, palpitations, orthopnea and claudication.  Gastrointestinal: Negative for abdominal pain, blood in stool, constipation, diarrhea, heartburn,  melena, nausea and vomiting.  Genitourinary: Negative for dysuria, flank pain, frequency, hematuria and urgency.  Musculoskeletal: Negative for back pain, joint pain and myalgias.  Skin: Negative for rash.  Neurological: Negative for dizziness, tingling, focal weakness, seizures, weakness and headaches.  Endo/Heme/Allergies: Does not bruise/bleed easily.  Psychiatric/Behavioral: Negative for depression and suicidal ideas. The patient does not have insomnia.     Allergies  Allergen Reactions  . Levofloxacin Shortness Of Breath  . Adhesive [Tape] Other (See Comments)  . Other Rash    Plastic tape    Patient Active Problem List   Diagnosis Date Noted  . Hiatal hernia 01/23/2021  . Left upper lobe pulmonary nodule 01/23/2021  . Symptomatic anemia 01/21/2021  . GI bleeding 01/20/2021  . GERD (gastroesophageal reflux disease) 01/20/2021  . Hypothyroidism 01/20/2021  . Depression 01/20/2021  . COPD (chronic obstructive pulmonary disease) ([place]) 01/20/2021  . History of recent fall 07/30/2020  . Fracture of one rib, left side, initial encounter for closed fracture 07/30/2020  . Hypoxia 07/30/2020  . Chest pain, atypical 07/30/2020  . COPD exacerbation ([place]) 07/30/2020  . Community acquired pneumonia 03/20/2020  . Type 2 diabetes mellitus without complication ([place]) 19/62/2297  . Obesity, Class III, BMI 40-49.9 (morbid obesity) ([place]) 03/20/2020  . Acute respiratory failure with hypoxia ([place]) 03/19/2020  . Hyponatremia 04/27/2019  . Hallucinations, visual 04/27/2019  . Iron deficiency anemia 02/01/2018  . Acute exacerbation of chronic obstructive pulmonary disease (COPD) ([place]) 11/05/2016  . Spinal stenosis, lumbar region, with neurogenic claudication 07/08/2015  . Sacroiliac joint dysfunction 06/28/2015  . Greater trochanteric bursitis 04/10/2015  . Sacroiliac joint disease  03/14/2015  . Facet syndrome, lumbar 03/14/2015  . HTN (hypertension) 03/06/2015  . Thyroid disease 03/06/2015   . DDD (degenerative disc disease), cervical 03/04/2015  . DDD (degenerative disc disease), lumbar 03/04/2015  . DJD (degenerative joint disease) of knee 03/04/2015     Past Medical History:  Diagnosis Date  . Allergy   . Asthma   . Back pain   . Bronchitis   . COPD (chronic obstructive pulmonary disease) ([place])   . Degenerative disc disease, lumbar   . Depression   . FHx: cholecystectomy   . GERD (gastroesophageal reflux disease)   . H/O: hysterectomy   . Headache   . Hypertension   . Murmur   . Osteoporosis   . Pneumonia 03/31/16   being treated by PCP  . Restless legs syndrome   . Scoliosis   . Shingles   . Thyroid disease      Past Surgical History:  Procedure Laterality Date  . ABDOMINAL HYSTERECTOMY    . APPENDECTOMY    . BACK SURGERY    . CHOLECYSTECTOMY    . COLONOSCOPY WITH PROPOFOL N/A 01/16/2018   Procedure: COLONOSCOPY WITH PROPOFOL;  Surgeon: Toledo, Benay Pike, MD;  Location: [facility] ENDOSCOPY;  Service: Gastroenterology;  Laterality: N/A;  . COLONOSCOPY WITH PROPOFOL N/A 01/21/2021   Procedure: COLONOSCOPY WITH PROPOFOL;  Surgeon: Lin Landsman, MD;  Location: [facility] ENDOSCOPY;  Service: Gastroenterology;  Laterality: N/A;  . ESOPHAGOGASTRODUODENOSCOPY (EGD) WITH PROPOFOL N/A 01/16/2018   Procedure: ESOPHAGOGASTRODUODENOSCOPY (EGD) WITH PROPOFOL;  Surgeon: Toledo, Benay Pike, MD;  Location: [facility] ENDOSCOPY;  Service: Gastroenterology;  Laterality: N/A;  . ESOPHAGOGASTRODUODENOSCOPY (EGD) WITH PROPOFOL N/A 01/21/2021   Procedure: ESOPHAGOGASTRODUODENOSCOPY (EGD) WITH PROPOFOL;  Surgeon: Lin Landsman, MD;  Location: [facility] ENDOSCOPY;  Service: Gastroenterology;  Laterality: N/A;  . JOINT REPLACEMENT Left 1998   shoulder  . NECK SURGERY Bilateral   . ROTATOR CUFF REPAIR Right   . SHOULDER OPEN ROTATOR CUFF REPAIR Right   . SHOULDER SURGERY Left    replacement    Social History   Socioeconomic History  . Marital status: Widowed    Spouse name: Not  on file  . Number of children: Not on file  . Years of education: Not on file  . Highest education level: Not on file  Occupational History  . Not on file  Tobacco Use  . Smoking status: Former Smoker    Packs/day: 1.00    Years: 30.00    Pack years: 30.00    Types: Cigarettes    Quit date: 11/04/2016    Years since quitting: 4.3  . Smokeless tobacco: Never Used  Vaping Use  . Vaping Use: Never used  Substance and Sexual Activity  . Alcohol use: No    Alcohol/week: 0.0 standard drinks  . Drug use: No  . Sexual activity: Not on file  Other Topics Concern  . Not on file  Social History Narrative  . Not on file   Social Determinants of Health   Financial Resource Strain: Not on file  Food Insecurity: Not on file  Transportation Needs: Not on file  Physical Activity: Not on file  Stress: Not on file  Social Connections: Not on file  Intimate Partner Violence: Not on file     Family History  Problem Relation Age of Onset  . Kidney disease Father   . Heart disease Mother   . Diabetes Mother      Current Outpatient Medications:  .  albuterol (PROVENTIL HFA;VENTOLIN HFA) 108 (90  Base) MCG/ACT inhaler, Inhale 1-2 puffs into the lungs every 6 (six) hours as needed for wheezing or shortness of breath., Disp: , Rfl:  .  bisacodyl (DULCOLAX) 10 MG suppository, Place 1 suppository (10 mg total) rectally daily as needed for severe constipation., Disp: 12 suppository, Rfl: 0 .  bisacodyl (DULCOLAX) 5 MG EC tablet, Take 1 tablet (5 mg total) by mouth daily as needed for moderate constipation., Disp: 30 tablet, Rfl: 0 .  Cholecalciferol (VITAMIN D) 2000 UNITS tablet, Take 2,000 Units by mouth daily., Disp: , Rfl:  .  citalopram (CELEXA) 40 MG tablet, Take 40 mg by mouth daily., Disp: , Rfl:  .  gabapentin (NEURONTIN) 100 MG capsule, Take 2 capsules (200 mg total) by mouth at bedtime., Disp: 30 capsule, Rfl: 0 .  levothyroxine (SYNTHROID, LEVOTHROID) 112 MCG tablet, Take 112 mcg by  mouth daily before breakfast., Disp: , Rfl:  .  metoprolol tartrate (LOPRESSOR) 25 MG tablet, Take 1 tablet (25 mg total) by mouth 2 (two) times daily., Disp: , Rfl:  .  Multiple Vitamin (MULTIVITAMIN) tablet, Take 1 tablet by mouth daily. Reported on 04/05/2016, Disp: , Rfl:  .  omeprazole (PRILOSEC) 20 MG capsule, Take 20 mg by mouth daily. , Disp: , Rfl:  .  potassium chloride SA (K-DUR,KLOR-CON) 20 MEQ tablet, Take 20 mEq by mouth daily., Disp: , Rfl:  .  tiotropium (SPIRIVA HANDIHALER) 18 MCG inhalation capsule, Place 1 capsule (18 mcg total) into inhaler and inhale daily., Disp: 30 capsule, Rfl: 12 .  traZODone (DESYREL) 100 MG tablet, Take 100 mg by mouth at bedtime., Disp: , Rfl:  .  vitamin B-12 1000 MCG tablet, Take 1 tablet (1,000 mcg total) by mouth daily., Disp: 30 tablet, Rfl: 1 .  Fluticasone-Salmeterol (ADVAIR DISKUS) 250-50 MCG/DOSE AEPB, Inhale 1 puff into the lungs 2 (two) times daily. (Patient not taking: Reported on 03/15/2021), Disp: 60 each, Rfl: 0 .  senna-docusate (SENOKOT-S) 8.6-50 MG tablet, Take 1 tablet by mouth daily. (Patient not taking: Reported on 03/15/2021), Disp: , Rfl:    Physical exam:  Vitals:   03/15/21 1106  BP: 111/76  Pulse: 66  Temp: (!) 97.2 F (36.2 C)  TempSrc: Tympanic  SpO2: 99%  Weight: 200 lb (90.7 kg)  Height: 4\' 10"  (1.473 m)   Physical Exam Constitutional:      Comments: She is sitting in a wheelchair and is on home oxygen  Cardiovascular:     Rate and Rhythm: Normal rate and regular rhythm.     Heart sounds: Normal heart sounds.  Pulmonary:     Effort: Pulmonary effort is normal.     Breath sounds: Normal breath sounds.  Abdominal:     General: Bowel sounds are normal.     Palpations: Abdomen is soft.  Musculoskeletal:     Right lower leg: No edema.     Left lower leg: No edema.  Skin:    General: Skin is warm and dry.  Neurological:     Mental Status: She is alert and oriented to person, place, and time.        CMP  Latest Ref Rng & Units 01/23/2021  Glucose 70 - 99 mg/dL 99  BUN 8 - 23 mg/dL 6(L)  Creatinine 0.44 - 1.00 mg/dL 0.59  Sodium 135 - 145 mmol/L 134(L)  Potassium 3.5 - 5.1 mmol/L 3.8  Chloride 98 - 111 mmol/L 96(L)  CO2 22 - 32 mmol/L 32  Calcium 8.9 - 10.3 mg/dL 8.4(L)  Total Protein 6.5 -  8.1 g/dL -  Total Bilirubin 0.3 - 1.2 mg/dL -  Alkaline Phos 38 - 126 U/L -  AST 15 - 41 U/L -  ALT 0 - 44 U/L -   CBC Latest Ref Rng & Units 03/15/2021  WBC 4.0 - 10.5 K/uL 6.3  Hemoglobin 12.0 - 15.0 g/dL 11.9(L)  Hematocrit 36.0 - 46.0 % 39.1  Platelets 150 - 400 K/uL 221     Assessment and plan- Patient is a 75 y.o. female who has been referred for following issues:  1.  Spiculated left upper lobe lung nodule: This is concerning for lung cancer.  She would need a PET CT scan for completing her staging work-up.  Based on PET CT scan we will decide if her lung nodule can potentially be biopsied either by bronchoscopy or CT-guided approach.  She is not a candidate for an definitive surgery.  She has been on home oxygen 4 L over the last 2 months.  If the lung nodule cannot be biopsied safely she will need to see radiation oncology to discuss SBRT to the lesion.  No role for systemic chemotherapy for presumed stage I lung cancer  2.  Normocytic anemia: Patient had evidence of iron deficiency anemia when she was in the hospital in March 2022 when her hemoglobin had gone down to 6.5 needing blood transfusions.  Most recent hemoglobin a month ago was 11.  Today I will do an anemia work-up including a CBC B12 ferritin and iron studies and folate reticulocyte count and haptoglobin as well as myeloma panel.  I will see her next week to discuss the results of her blood work and PET CT scan by video visit   Thank you for this kind referral and the opportunity to participate in the care of this patient   Visit Diagnosis 1. Lung nodule   2. Normocytic anemia     Dr. Randa Evens, MD, MPH [facility] at [facility] [phone] 03/15/2021

## 2021-03-16 ENCOUNTER — Telehealth: Payer: Self-pay | Admitting: Oncology

## 2021-03-16 LAB — HAPTOGLOBIN: Haptoglobin: 210 mg/dL (ref 42–346)

## 2021-03-16 NOTE — Telephone Encounter (Signed)
Spoke with patient to make her aware of PET scan scheduled. Reviewed day/time/instructions and patient is agreeable. She requested that I call her son Jeneen Rinks also.   Called Patient's son, Jeneen Rinks to notify him of PET scan scheduled and Virtual Visit with Dr. Janese Banks on 5/26. He was agreeable.

## 2021-03-19 LAB — MULTIPLE MYELOMA PANEL, SERUM
Albumin SerPl Elph-Mcnc: 3.5 g/dL (ref 2.9–4.4)
Albumin/Glob SerPl: 1.1 (ref 0.7–1.7)
Alpha 1: 0.3 g/dL (ref 0.0–0.4)
Alpha2 Glob SerPl Elph-Mcnc: 0.9 g/dL (ref 0.4–1.0)
B-Globulin SerPl Elph-Mcnc: 1.1 g/dL (ref 0.7–1.3)
Gamma Glob SerPl Elph-Mcnc: 1.1 g/dL (ref 0.4–1.8)
Globulin, Total: 3.4 g/dL (ref 2.2–3.9)
IgA: 259 mg/dL (ref 64–422)
IgG (Immunoglobin G), Serum: 1148 mg/dL (ref 586–1602)
IgM (Immunoglobulin M), Srm: 169 mg/dL (ref 26–217)
Total Protein ELP: 6.9 g/dL (ref 6.0–8.5)

## 2021-03-23 ENCOUNTER — Other Ambulatory Visit: Payer: Self-pay

## 2021-03-23 ENCOUNTER — Ambulatory Visit
Admission: RE | Admit: 2021-03-23 | Discharge: 2021-03-23 | Disposition: A | Discharge status: Home / Self Care | Payer: Medicare Other | Source: Ambulatory Visit | Attending: Oncology | Admitting: Oncology

## 2021-03-23 DIAGNOSIS — I700 Atherosclerosis of aorta: Secondary | ICD-10-CM | POA: Insufficient documentation

## 2021-03-23 DIAGNOSIS — I2510 Atherosclerotic heart disease of native coronary artery without angina pectoris: Secondary | ICD-10-CM | POA: Diagnosis not present

## 2021-03-23 DIAGNOSIS — R911 Solitary pulmonary nodule: Secondary | ICD-10-CM | POA: Diagnosis present

## 2021-03-23 LAB — GLUCOSE, CAPILLARY: Glucose-Capillary: 101 mg/dL — ABNORMAL HIGH (ref 70–99)

## 2021-03-23 MED ORDER — FLUDEOXYGLUCOSE F - 18 (FDG) INJECTION
10.4000 | Freq: Once | INTRAVENOUS | Status: AC | PRN
  Administered 2021-03-23: 10.2 via INTRAVENOUS

## 2021-03-24 ENCOUNTER — Inpatient Hospital Stay (HOSPITAL_BASED_OUTPATIENT_CLINIC_OR_DEPARTMENT_OTHER): Payer: Medicare Other | Admitting: Oncology

## 2021-03-24 ENCOUNTER — Telehealth: Payer: Self-pay | Admitting: Oncology

## 2021-03-24 DIAGNOSIS — D649 Anemia, unspecified: Secondary | ICD-10-CM | POA: Diagnosis not present

## 2021-03-24 DIAGNOSIS — R911 Solitary pulmonary nodule: Secondary | ICD-10-CM

## 2021-03-24 NOTE — Telephone Encounter (Signed)
Left VM with patient to notify her of change in appt time this afternoon from 3:30 to 4:30. Left direct # for her to return call if this time change does not work for her.

## 2021-03-26 ENCOUNTER — Encounter: Payer: Self-pay | Admitting: Oncology

## 2021-03-26 NOTE — Progress Notes (Signed)
I connected with [name] on 03/26/21 at  4:30 PM EDT by video enabled telemedicine visit and verified that I am speaking with the correct person using two identifiers.   I discussed the limitations, risks, security and privacy concerns of performing an evaluation and management service by telemedicine and the availability of in-person appointments. I also discussed with the patient that there may be a patient responsible charge related to this service. The patient expressed understanding and agreed to proceed.  Other persons participating in the visit and their role in the encounter: Patient's son  Patient's location:  home Provider's location:  home  Chief Complaint: Discuss PET CT scan results and further management  History of present illness: Patient is a 75 year old female with a past medical history significant for hypertension hypothyroidism COPD among other medical problems.  She was admitted to the hospital in March 2022 for worsening anemia and required 2 units of PRBC transfusion.  EGD and colonoscopy was otherwise unremarkable.  She was also started on home oxygen at that time and is currently on 4 L.  She had a CT angio chest PE done during that hospitalization which showed no evidence of PE but a spiculated left upper lobe lung nodule measuring 1.7 x 1.3 cm which was larger as compared to prior size of 1.4 x 0.9 cm 6 months ago.  During her hospitalization her ferritin levels were low at 4 folate and B12 were low as well.  H&H was 8.3/27.6 after blood transfusion.  She has subsequently had her CBC checked on 02/14/2021 when H&H was improved to 11/37.3.   Results of anemia work-up including ferritin and iron studies B12 folate myeloma panel haptoglobin and reticulocyte count was unremarkable and patient's hemoglobin had improved to 11.9 from a prior value of 9.7.  PET scan showed a solitary posterior left upper lobe lung nodule 2 x 1.7 cm in size with an SUV of 7.4 but no other  evidence of locoregional adenopathy or distant metastatic disease  Interval history: Patient has baseline fatigue and exertional shortness of breath.  She denies other complaints at this time   Review of Systems  Constitutional: Negative for chills, fever, malaise/fatigue and weight loss.  HENT: Negative for congestion, ear discharge and nosebleeds.   Eyes: Negative for blurred vision.  Respiratory: Negative for cough, hemoptysis, sputum production, shortness of breath and wheezing.   Cardiovascular: Negative for chest pain, palpitations, orthopnea and claudication.  Gastrointestinal: Negative for abdominal pain, blood in stool, constipation, diarrhea, heartburn, melena, nausea and vomiting.  Genitourinary: Negative for dysuria, flank pain, frequency, hematuria and urgency.  Musculoskeletal: Negative for back pain, joint pain and myalgias.  Skin: Negative for rash.  Neurological: Negative for dizziness, tingling, focal weakness, seizures, weakness and headaches.  Endo/Heme/Allergies: Does not bruise/bleed easily.  Psychiatric/Behavioral: Negative for depression and suicidal ideas. The patient does not have insomnia.     Allergies  Allergen Reactions  . Levofloxacin Shortness Of Breath  . Adhesive [Tape] Other (See Comments)  . Other Rash    Plastic tape    Past Medical History:  Diagnosis Date  . Allergy   . Asthma   . Back pain   . Bronchitis   . COPD (chronic obstructive pulmonary disease) ([place])   . Degenerative disc disease, lumbar   . Depression   . FHx: cholecystectomy   . GERD (gastroesophageal reflux disease)   . H/O: hysterectomy   . Headache   . Hypertension   . Murmur   . Osteoporosis   .  Pneumonia 03/31/16   being treated by PCP  . Restless legs syndrome   . Scoliosis   . Shingles   . Thyroid disease     Past Surgical History:  Procedure Laterality Date  . ABDOMINAL HYSTERECTOMY    . APPENDECTOMY    . BACK SURGERY    . CHOLECYSTECTOMY    .  COLONOSCOPY WITH PROPOFOL N/A 01/16/2018   Procedure: COLONOSCOPY WITH PROPOFOL;  Surgeon: Toledo, Benay Pike, MD;  Location: [facility] ENDOSCOPY;  Service: Gastroenterology;  Laterality: N/A;  . COLONOSCOPY WITH PROPOFOL N/A 01/21/2021   Procedure: COLONOSCOPY WITH PROPOFOL;  Surgeon: Lin Landsman, MD;  Location: [facility] ENDOSCOPY;  Service: Gastroenterology;  Laterality: N/A;  . ESOPHAGOGASTRODUODENOSCOPY (EGD) WITH PROPOFOL N/A 01/16/2018   Procedure: ESOPHAGOGASTRODUODENOSCOPY (EGD) WITH PROPOFOL;  Surgeon: Toledo, Benay Pike, MD;  Location: [facility] ENDOSCOPY;  Service: Gastroenterology;  Laterality: N/A;  . ESOPHAGOGASTRODUODENOSCOPY (EGD) WITH PROPOFOL N/A 01/21/2021   Procedure: ESOPHAGOGASTRODUODENOSCOPY (EGD) WITH PROPOFOL;  Surgeon: Lin Landsman, MD;  Location: [facility] ENDOSCOPY;  Service: Gastroenterology;  Laterality: N/A;  . JOINT REPLACEMENT Left 1998   shoulder  . NECK SURGERY Bilateral   . ROTATOR CUFF REPAIR Right   . SHOULDER OPEN ROTATOR CUFF REPAIR Right   . SHOULDER SURGERY Left    replacement    Social History   Socioeconomic History  . Marital status: Widowed    Spouse name: Not on file  . Number of children: Not on file  . Years of education: Not on file  . Highest education level: Not on file  Occupational History  . Not on file  Tobacco Use  . Smoking status: Former Smoker    Packs/day: 1.00    Years: 30.00    Pack years: 30.00    Types: Cigarettes    Quit date: 11/04/2016    Years since quitting: 4.3  . Smokeless tobacco: Never Used  Vaping Use  . Vaping Use: Never used  Substance and Sexual Activity  . Alcohol use: No    Alcohol/week: 0.0 standard drinks  . Drug use: No  . Sexual activity: Not on file  Other Topics Concern  . Not on file  Social History Narrative  . Not on file   Social Determinants of Health   Financial Resource Strain: Not on file  Food Insecurity: Not on file  Transportation Needs: Not on file  Physical Activity: Not on file   Stress: Not on file  Social Connections: Not on file  Intimate Partner Violence: Not on file    Family History  Problem Relation Age of Onset  . Kidney disease Father   . Heart disease Mother   . Diabetes Mother      Current Outpatient Medications:  .  albuterol (PROVENTIL HFA;VENTOLIN HFA) 108 (90 Base) MCG/ACT inhaler, Inhale 1-2 puffs into the lungs every 6 (six) hours as needed for wheezing or shortness of breath., Disp: , Rfl:  .  bisacodyl (DULCOLAX) 5 MG EC tablet, Take 1 tablet (5 mg total) by mouth daily as needed for moderate constipation., Disp: 30 tablet, Rfl: 0 .  Cholecalciferol (VITAMIN D) 2000 UNITS tablet, Take 2,000 Units by mouth daily., Disp: , Rfl:  .  citalopram (CELEXA) 40 MG tablet, Take 40 mg by mouth daily., Disp: , Rfl:  .  gabapentin (NEURONTIN) 100 MG capsule, Take 2 capsules (200 mg total) by mouth at bedtime., Disp: 30 capsule, Rfl: 0 .  levothyroxine (SYNTHROID, LEVOTHROID) 112 MCG tablet, Take 112 mcg by mouth daily before breakfast., Disp: ,  Rfl:  .  metoprolol tartrate (LOPRESSOR) 25 MG tablet, Take 1 tablet (25 mg total) by mouth 2 (two) times daily., Disp: , Rfl:  .  Multiple Vitamin (MULTIVITAMIN) tablet, Take 1 tablet by mouth daily. Reported on 04/05/2016, Disp: , Rfl:  .  omeprazole (PRILOSEC) 20 MG capsule, Take 20 mg by mouth daily. , Disp: , Rfl:  .  potassium chloride SA (K-DUR,KLOR-CON) 20 MEQ tablet, Take 20 mEq by mouth daily., Disp: , Rfl:  .  traZODone (DESYREL) 100 MG tablet, Take 100 mg by mouth at bedtime., Disp: , Rfl:  .  bisacodyl (DULCOLAX) 10 MG suppository, Place 1 suppository (10 mg total) rectally daily as needed for severe constipation. (Patient not taking: Reported on 03/24/2021), Disp: 12 suppository, Rfl: 0 .  Fluticasone-Salmeterol (ADVAIR DISKUS) 250-50 MCG/DOSE AEPB, Inhale 1 puff into the lungs 2 (two) times daily. (Patient not taking: No sig reported), Disp: 60 each, Rfl: 0 .  senna-docusate (SENOKOT-S) 8.6-50 MG tablet,  Take 1 tablet by mouth daily. (Patient not taking: No sig reported), Disp: , Rfl:  .  tiotropium (SPIRIVA HANDIHALER) 18 MCG inhalation capsule, Place 1 capsule (18 mcg total) into inhaler and inhale daily. (Patient not taking: Reported on 03/24/2021), Disp: 30 capsule, Rfl: 12 .  vitamin B-12 1000 MCG tablet, Take 1 tablet (1,000 mcg total) by mouth daily. (Patient not taking: Reported on 03/24/2021), Disp: 30 tablet, Rfl: 1  NM PET Image Initial (PI) Skull Base To Thigh  Result Date: 03/24/2021 CLINICAL DATA:  Initial treatment strategy for pulmonary nodule. EXAM: NUCLEAR MEDICINE PET SKULL BASE TO THIGH TECHNIQUE: 10.2 mCi F-18 FDG was injected intravenously. Full-ring PET imaging was performed from the skull base to thigh after the radiotracer. CT data was obtained and used for attenuation correction and anatomic localization. Fasting blood glucose: 101 mg/dl COMPARISON:  No prior PET-CT.  Chest CT 01/22/2021. FINDINGS: Mediastinal blood pool activity: SUV max 2.8 Liver activity: SUV max NA NECK: No hypermetabolic lymph nodes in the neck. Incidental CT findings: none CHEST: The nodule of concern in the posterior aspect of the left upper lobe (axial image 63 of series 3) measures 2.0 x 1.7 cm and is hypermetabolic (SUVmax = 7.4). No hypermetabolic mediastinal or hilar lymphadenopathy. Incidental CT findings: Atherosclerotic calcifications are noted in the thoracic aorta as well as the left anterior descending, left circumflex and right coronary arteries. Elevation of the left hemidiaphragm with some passive subsegmental atelectasis or scarring in the left lung base. Dependent subsegmental atelectasis or scarring also noted in the right lower lobe. ABDOMEN/PELVIS: No abnormal hypermetabolic activity within the liver, pancreas, adrenal glands, or spleen. No hypermetabolic lymph nodes in the abdomen or pelvis. Incidental CT findings: Status post cholecystectomy. Aortic atherosclerosis. SKELETON: No focal  hypermetabolic activity to suggest skeletal metastasis. Status post left shoulder arthroplasty. Incidental CT findings: none IMPRESSION: 1. 2.0 x 1.7 cm hypermetabolic left upper lobe pulmonary nodule concerning for primary bronchogenic neoplasm. No mediastinal or hilar lymphadenopathy, and no definite signs of distal metastatic disease noted on today's examination. Findings suggest T1c, N0, Mx (likely stage 1A disease). 2. Aortic atherosclerosis, as well as 3 vessel coronary artery disease. 3. Additional incidental findings, as above. Electronically Signed   By: Vinnie Langton M.D.   On: 03/24/2021 14:33    No images are attached to the encounter.   CMP Latest Ref Rng & Units 01/23/2021  Glucose 70 - 99 mg/dL 99  BUN 8 - 23 mg/dL 6(L)  Creatinine 0.44 - 1.00 mg/dL 0.59  Sodium 135 - 145 mmol/L 134(L)  Potassium 3.5 - 5.1 mmol/L 3.8  Chloride 98 - 111 mmol/L 96(L)  CO2 22 - 32 mmol/L 32  Calcium 8.9 - 10.3 mg/dL 8.4(L)  Total Protein 6.5 - 8.1 g/dL -  Total Bilirubin 0.3 - 1.2 mg/dL -  Alkaline Phos 38 - 126 U/L -  AST 15 - 41 U/L -  ALT 0 - 44 U/L -   CBC Latest Ref Rng & Units 03/15/2021  WBC 4.0 - 10.5 K/uL 6.3  Hemoglobin 12.0 - 15.0 g/dL 11.9(L)  Hematocrit 36.0 - 46.0 % 39.1  Platelets 150 - 400 K/uL 221     Observation/objective: Appears in no acute distress over video visit today.  She is on home oxygen  Assessment and plan: Patient is a 75 year old female and this is a video visit for following issues:  1.  Spiculated left upper lobe lung nodule: This was hypermetabolic on PET scan concerning for lung cancer.  I will have our lung cancer navigator [address] touch base with radiology to see if they can do a CT-guided biopsy safely and if they cannot do that would bronchoscopy be an option.  If this lesion cannot be targeted by biopsy safely we will reach out to Dr. Donella Stade to see if he could empirically radiated.  Follow-up with me would be determined after radiation  oncology consult  2.  Normocytic anemia: Presently her hemoglobin is improved to 11.7 and anemia work-up was otherwise unremarkable.  We will repeat labs at next visit  Follow-up instructions: To be decided I discussed the assessment and treatment plan with the patient. The patient was provided an opportunity to ask questions and all were answered. The patient agreed with the plan and demonstrated an understanding of the instructions.   The patient was advised to call back or seek an in-person evaluation if the symptoms worsen or if the condition fails to improve as anticipated.  Visit Diagnosis: 1. Nodule of upper lobe of left lung   2. Normocytic anemia     Dr. Randa Evens, MD, MPH [facility] at [facility] Tel- [phone] 03/26/2021 8:19 AM

## 2021-03-29 ENCOUNTER — Encounter: Payer: Self-pay | Admitting: *Deleted

## 2021-03-29 NOTE — Progress Notes (Signed)
Per Dr. Janese Banks, pt needs to be set up for biopsy of the left upper lobe nodule seen on recent PET scan. Spoke with Dr. Lanney Gins who agreed to see pt in the clinic to evaluate for bronchoscopy. Referral placed and pt made aware to expect call for [facility] pulmonary with appt. Pt verbalized understanding.

## 2021-04-11 ENCOUNTER — Telehealth: Payer: Self-pay | Admitting: Oncology

## 2021-04-11 ENCOUNTER — Encounter: Payer: Self-pay | Admitting: *Deleted

## 2021-04-11 DIAGNOSIS — R911 Solitary pulmonary nodule: Secondary | ICD-10-CM

## 2021-04-11 NOTE — Progress Notes (Signed)
At this time, Pt declined going through any procedure for tissue diagnosis and did not want to see Dr. Baruch Gouty for empiric radiation. Per Dr. Janese Banks, pt needs follow up CT scan in August and follow up visit with her 1-2 days after scan. Orders entered and message sent to schedule pt for appts. Nothing further needed at this time.

## 2021-04-11 NOTE — Telephone Encounter (Signed)
Spoke with patient to notify her of CT scan and MD f/u scheduled in Aug 2022. Patient agreeable. Sending updated AVS in the mail.

## 2021-06-01 ENCOUNTER — Ambulatory Visit
Admission: RE | Admit: 2021-06-01 | Discharge: 2021-06-01 | Disposition: A | Discharge status: Home / Self Care | Payer: Medicare Other | Source: Ambulatory Visit | Attending: Oncology | Admitting: Oncology

## 2021-06-01 ENCOUNTER — Other Ambulatory Visit: Payer: Self-pay

## 2021-06-01 DIAGNOSIS — R911 Solitary pulmonary nodule: Secondary | ICD-10-CM | POA: Insufficient documentation

## 2021-06-03 ENCOUNTER — Inpatient Hospital Stay: Payer: Medicare Other | Attending: Oncology | Admitting: Oncology

## 2021-06-03 ENCOUNTER — Other Ambulatory Visit: Payer: Self-pay

## 2021-06-03 ENCOUNTER — Encounter: Payer: Self-pay | Admitting: Oncology

## 2021-06-03 VITALS — BP 116/71 | HR 73 | Temp 97.2°F | Resp 20 | Wt 200.0 lb

## 2021-06-03 DIAGNOSIS — I10 Essential (primary) hypertension: Secondary | ICD-10-CM | POA: Diagnosis not present

## 2021-06-03 DIAGNOSIS — R531 Weakness: Secondary | ICD-10-CM | POA: Insufficient documentation

## 2021-06-03 DIAGNOSIS — I700 Atherosclerosis of aorta: Secondary | ICD-10-CM | POA: Insufficient documentation

## 2021-06-03 DIAGNOSIS — R5383 Other fatigue: Secondary | ICD-10-CM | POA: Insufficient documentation

## 2021-06-03 DIAGNOSIS — Z9884 Bariatric surgery status: Secondary | ICD-10-CM | POA: Diagnosis not present

## 2021-06-03 DIAGNOSIS — D649 Anemia, unspecified: Secondary | ICD-10-CM | POA: Insufficient documentation

## 2021-06-03 DIAGNOSIS — J449 Chronic obstructive pulmonary disease, unspecified: Secondary | ICD-10-CM | POA: Diagnosis not present

## 2021-06-03 DIAGNOSIS — I2510 Atherosclerotic heart disease of native coronary artery without angina pectoris: Secondary | ICD-10-CM | POA: Insufficient documentation

## 2021-06-03 DIAGNOSIS — M810 Age-related osteoporosis without current pathological fracture: Secondary | ICD-10-CM | POA: Diagnosis not present

## 2021-06-03 DIAGNOSIS — K449 Diaphragmatic hernia without obstruction or gangrene: Secondary | ICD-10-CM | POA: Diagnosis not present

## 2021-06-03 DIAGNOSIS — Z79899 Other long term (current) drug therapy: Secondary | ICD-10-CM | POA: Insufficient documentation

## 2021-06-03 DIAGNOSIS — K219 Gastro-esophageal reflux disease without esophagitis: Secondary | ICD-10-CM | POA: Diagnosis not present

## 2021-06-03 DIAGNOSIS — E039 Hypothyroidism, unspecified: Secondary | ICD-10-CM | POA: Insufficient documentation

## 2021-06-03 DIAGNOSIS — R911 Solitary pulmonary nodule: Secondary | ICD-10-CM

## 2021-06-05 ENCOUNTER — Encounter: Payer: Self-pay | Admitting: Oncology

## 2021-06-05 NOTE — Progress Notes (Signed)
Hematology/Oncology Consult note [facility]  Telephone:([phone] [phone]  Patient Care Team: Baxter Hire, MD as PCP - General (Internal Medicine) Telford Nab, RN as Oncology Nurse Navigator Ottie Glazier, MD as Consulting Physician (Pulmonary Disease)   Name of the patient: [name]  [id number]  Feb 28, 1946   Date of visit: 06/05/21  Diagnosis-left upper lobe lung mass concerning for lung cancer  Chief complaint/ Reason for visit-discuss CT scan results and further management  Heme/Onc history: Patient is a 75 year old female with a past medical history significant for hypertension hypothyroidism COPD among other medical problems.  She was admitted to the hospital in March 2022 for worsening anemia and required 2 units of PRBC transfusion.  EGD and colonoscopy was otherwise unremarkable.  She was also started on home oxygen at that time and is currently on 4 L.  She had a CT angio chest PE done during that hospitalization which showed no evidence of PE but a spiculated left upper lobe lung nodule measuring 1.7 x 1.3 cm which was larger as compared to prior size of 1.4 x 0.9 cm 6 months ago.  During her hospitalization her ferritin levels were low at 4 folate and B12 were low as well.  H&H was 8.3/27.6 after blood transfusion.  She has subsequently had her CBC checked on 02/14/2021 when H&H was improved to 11/37.3.    Results of anemia work-up including ferritin and iron studies B12 folate myeloma panel haptoglobin and reticulocyte count was unremarkable and patient's hemoglobin had improved to 11.9 from a prior value of 9.7.   PET scan showed a solitary posterior left upper lobe lung nodule 2 x 1.7 cm in size with an SUV of 7.4 but no other evidence of locoregional adenopathy or distant metastatic disease  Interval history-patient is at her baseline state of health and slowly making progress after her discharge from the hospital in March  2022 When she was admitted for GI bleeding.  She is slowly able to get out of the wheelchair and walk a few steps.  She has baseline fatigue and exertional shortness of breath  ECOG PS- 2-3 Pain scale- 0  Review of systems- Review of Systems  Constitutional:  Positive for malaise/fatigue. Negative for chills, fever and weight loss.  HENT:  Negative for congestion, ear discharge and nosebleeds.   Eyes:  Negative for blurred vision.  Respiratory:  Positive for shortness of breath. Negative for cough, hemoptysis, sputum production and wheezing.   Cardiovascular:  Negative for chest pain, palpitations, orthopnea and claudication.  Gastrointestinal:  Negative for abdominal pain, blood in stool, constipation, diarrhea, heartburn, melena, nausea and vomiting.  Genitourinary:  Negative for dysuria, flank pain, frequency, hematuria and urgency.  Musculoskeletal:  Negative for back pain, joint pain and myalgias.  Skin:  Negative for rash.  Neurological:  Negative for dizziness, tingling, focal weakness, seizures, weakness and headaches.  Endo/Heme/Allergies:  Does not bruise/bleed easily.  Psychiatric/Behavioral:  Negative for depression and suicidal ideas. The patient does not have insomnia.     Allergies  Allergen Reactions   Levofloxacin Shortness Of Breath   Adhesive [Tape] Other (See Comments)   Other Rash    Plastic tape     Past Medical History:  Diagnosis Date   Allergy    Asthma    Back pain    Bronchitis    COPD (chronic obstructive pulmonary disease) (HCC)    Degenerative disc disease, lumbar    Depression    FHx: cholecystectomy  GERD (gastroesophageal reflux disease)    H/O: hysterectomy    Headache    Hypertension    Murmur    Osteoporosis    Pneumonia 03/31/16   being treated by PCP   Restless legs syndrome    Scoliosis    Shingles    Thyroid disease      Past Surgical History:  Procedure Laterality Date   ABDOMINAL HYSTERECTOMY     APPENDECTOMY      BACK SURGERY     CHOLECYSTECTOMY     COLONOSCOPY WITH PROPOFOL N/A 01/16/2018   Procedure: COLONOSCOPY WITH PROPOFOL;  Surgeon: Toledo, Benay Pike, MD;  Location: [facility] ENDOSCOPY;  Service: Gastroenterology;  Laterality: N/A;   COLONOSCOPY WITH PROPOFOL N/A 01/21/2021   Procedure: COLONOSCOPY WITH PROPOFOL;  Surgeon: Lin Landsman, MD;  Location: [facility] ENDOSCOPY;  Service: Gastroenterology;  Laterality: N/A;   ESOPHAGOGASTRODUODENOSCOPY (EGD) WITH PROPOFOL N/A 01/16/2018   Procedure: ESOPHAGOGASTRODUODENOSCOPY (EGD) WITH PROPOFOL;  Surgeon: Toledo, Benay Pike, MD;  Location: [facility] ENDOSCOPY;  Service: Gastroenterology;  Laterality: N/A;   ESOPHAGOGASTRODUODENOSCOPY (EGD) WITH PROPOFOL N/A 01/21/2021   Procedure: ESOPHAGOGASTRODUODENOSCOPY (EGD) WITH PROPOFOL;  Surgeon: Lin Landsman, MD;  Location: [facility] ENDOSCOPY;  Service: Gastroenterology;  Laterality: N/A;   JOINT REPLACEMENT Left 1998   shoulder   NECK SURGERY Bilateral    ROTATOR CUFF REPAIR Right    SHOULDER OPEN ROTATOR CUFF REPAIR Right    SHOULDER SURGERY Left    replacement    Social History   Socioeconomic History   Marital status: Widowed    Spouse name: Not on file   Number of children: Not on file   Years of education: Not on file   Highest education level: Not on file  Occupational History   Not on file  Tobacco Use   Smoking status: Former    Packs/day: 1.00    Years: 30.00    Pack years: 30.00    Types: Cigarettes    Quit date: 11/04/2016    Years since quitting: 4.5   Smokeless tobacco: Never  Vaping Use   Vaping Use: Never used  Substance and Sexual Activity   Alcohol use: No    Alcohol/week: 0.0 standard drinks   Drug use: No   Sexual activity: Not on file  Other Topics Concern   Not on file  Social History Narrative   Not on file   Social Determinants of Health   Financial Resource Strain: Not on file  Food Insecurity: Not on file  Transportation Needs: Not on file  Physical Activity: Not  on file  Stress: Not on file  Social Connections: Not on file  Intimate Partner Violence: Not on file    Family History  Problem Relation Age of Onset   Kidney disease Father    Heart disease Mother    Diabetes Mother      Current Outpatient Medications:    albuterol (PROVENTIL HFA;VENTOLIN HFA) 108 (90 Base) MCG/ACT inhaler, Inhale 1-2 puffs into the lungs every 6 (six) hours as needed for wheezing or shortness of breath., Disp: , Rfl:    bisacodyl (DULCOLAX) 5 MG EC tablet, Take 1 tablet (5 mg total) by mouth daily as needed for moderate constipation., Disp: 30 tablet, Rfl: 0   Cholecalciferol (VITAMIN D) 2000 UNITS tablet, Take 2,000 Units by mouth daily., Disp: , Rfl:    citalopram (CELEXA) 40 MG tablet, Take 40 mg by mouth daily., Disp: , Rfl:    gabapentin (NEURONTIN) 100 MG capsule, Take 2 capsules (200 mg  total) by mouth at bedtime., Disp: 30 capsule, Rfl: 0   levothyroxine (SYNTHROID, LEVOTHROID) 112 MCG tablet, Take 112 mcg by mouth daily before breakfast., Disp: , Rfl:    metoprolol tartrate (LOPRESSOR) 25 MG tablet, Take 1 tablet (25 mg total) by mouth 2 (two) times daily., Disp: , Rfl:    Multiple Vitamin (MULTIVITAMIN) tablet, Take 1 tablet by mouth daily. Reported on 04/05/2016, Disp: , Rfl:    omeprazole (PRILOSEC) 20 MG capsule, Take 20 mg by mouth daily. , Disp: , Rfl:    potassium chloride SA (K-DUR,KLOR-CON) 20 MEQ tablet, Take 20 mEq by mouth daily., Disp: , Rfl:    traZODone (DESYREL) 100 MG tablet, Take 100 mg by mouth at bedtime., Disp: , Rfl:    bisacodyl (DULCOLAX) 10 MG suppository, Place 1 suppository (10 mg total) rectally daily as needed for severe constipation. (Patient not taking: No sig reported), Disp: 12 suppository, Rfl: 0   Fluticasone-Salmeterol (ADVAIR DISKUS) 250-50 MCG/DOSE AEPB, Inhale 1 puff into the lungs 2 (two) times daily. (Patient not taking: No sig reported), Disp: 60 each, Rfl: 0   senna-docusate (SENOKOT-S) 8.6-50 MG tablet, Take 1 tablet  by mouth daily. (Patient not taking: No sig reported), Disp: , Rfl:    tiotropium (SPIRIVA HANDIHALER) 18 MCG inhalation capsule, Place 1 capsule (18 mcg total) into inhaler and inhale daily. (Patient not taking: No sig reported), Disp: 30 capsule, Rfl: 12   vitamin B-12 1000 MCG tablet, Take 1 tablet (1,000 mcg total) by mouth daily. (Patient not taking: No sig reported), Disp: 30 tablet, Rfl: 1  Physical exam:  Vitals:   06/03/21 1115  BP: 116/71  Pulse: 73  Resp: 20  Temp: (!) 97.2 F (36.2 C)  TempSrc: Tympanic  SpO2: 100%  Weight: 200 lb (90.7 kg)   Physical Exam Constitutional:      Comments: Sitting in a wheelchair.  She is on home oxygen  Cardiovascular:     Rate and Rhythm: Normal rate and regular rhythm.     Heart sounds: Normal heart sounds.  Pulmonary:     Effort: Pulmonary effort is normal.     Breath sounds: Normal breath sounds.  Skin:    General: Skin is warm and dry.  Neurological:     Mental Status: She is alert and oriented to person, place, and time.     CMP Latest Ref Rng & Units 01/23/2021  Glucose 70 - 99 mg/dL 99  BUN 8 - 23 mg/dL 6(L)  Creatinine 0.44 - 1.00 mg/dL 0.59  Sodium 135 - 145 mmol/L 134(L)  Potassium 3.5 - 5.1 mmol/L 3.8  Chloride 98 - 111 mmol/L 96(L)  CO2 22 - 32 mmol/L 32  Calcium 8.9 - 10.3 mg/dL 8.4(L)  Total Protein 6.5 - 8.1 g/dL -  Total Bilirubin 0.3 - 1.2 mg/dL -  Alkaline Phos 38 - 126 U/L -  AST 15 - 41 U/L -  ALT 0 - 44 U/L -   CBC Latest Ref Rng & Units 03/15/2021  WBC 4.0 - 10.5 K/uL 6.3  Hemoglobin 12.0 - 15.0 g/dL 11.9(L)  Hematocrit 36.0 - 46.0 % 39.1  Platelets 150 - 400 K/uL 221    No images are attached to the encounter.  CT Chest Wo Contrast  Result Date: 06/02/2021 CLINICAL DATA:  Left upper lobe lung nodule, COPD, oxygen dependent EXAM: CT CHEST WITHOUT CONTRAST TECHNIQUE: Multidetector CT imaging of the chest was performed following the standard protocol without IV contrast. COMPARISON:  PET-CT,  03/23/2021, CT chest,  01/21/2021 FINDINGS: Cardiovascular: Aortic atherosclerosis. Normal heart size. Three-vessel coronary artery calcifications. No pericardial effusion. Mediastinum/Nodes: No enlarged mediastinal, hilar, or axillary lymph nodes. Large paraesophageal hernia containing the gastric body and fundus. Thyroid gland, trachea, and esophagus demonstrate no significant findings. Lungs/Pleura: Spiculated pulmonary nodule of the posterior left upper lobe abutting the fissure is not significantly changed compared to prior PET-CT examination, measuring 2.0 x 1.4 cm, previously 2.0 x 1.7 cm (series 4, image 25). Background of fine centrilobular nodules, most concentrated in the lung apices. Compressive scarring and atelectasis of the left lung base secondary to [place] off a GIA hernia. Bland appearing dependent scarring of the right lung base. No pleural effusion or pneumothorax. Upper Abdomen: No acute abnormality. Musculoskeletal: No chest wall mass or suspicious bone lesions identified. Unchanged wedge deformity of T11. IMPRESSION: 1. Spiculated pulmonary nodule of the posterior left upper lobe abutting the fissure is not significantly changed compared to prior PET-CT examination, measuring 2.0 x 1.4 cm. This remains highly concerning for primary lung malignancy. 2. No noncontrast evidence of lymphadenopathy or metastatic disease in the chest. 3. Background of fine centrilobular nodules, most concentrated in the lung apices, most commonly seen in smoking-related respiratory bronchiolitis. 4. Large paraesophageal hernia containing the gastric body and fundus. 5. Coronary artery disease. Aortic Atherosclerosis (ICD10-I70.0). Electronically Signed   By: Eddie Candle M.D.   On: 06/02/2021 08:20     Assessment and plan- Patient is a 75 y.o. female with spiculated left upper lobe lung nodule concerning for lung cancer  Lung nodule: We repeated another CT chest without contrast which was compared to the prior  PET scan 3 months ago.Left upper lobe nodule is more or less stable at around 2 x 1.4 cm and remains concerning for lung cancer given the hypermetabolism noted on prior PET.  No evidence of hilar or mediastinal adenopathy.  She is not a candidate for surgical management.  Previously she was hesitant to pursue radiation as she was just discharged from the hospital and making a slow recovery.  She is amenable to discussing with radiation oncology and pursuing SBRT if need be.  I will therefore refer her to Dr. Donella Stade for the same. 2.  Normocytic anemia: It had improved to11.9 in May 2022 and iron studies B12 and folate at that time were unremarkable.  This can be continued to be followed by Dr. Edwina Barth and can be referred to [name] in the future if questions or concerns arise  We will plan to do a repeat CT chest in 4 months time and see her thereafter   Visit Diagnosis 1. Lung nodule      Dr. Randa Evens, MD, MPH [facility] [phone] 06/05/2021 2:45 PM

## 2021-06-06 ENCOUNTER — Encounter: Payer: Self-pay | Admitting: *Deleted

## 2021-06-06 ENCOUNTER — Ambulatory Visit: Payer: Medicare Other | Admitting: Radiation Oncology

## 2021-06-15 ENCOUNTER — Ambulatory Visit
Admission: RE | Admit: 2021-06-15 | Discharge: 2021-06-15 | Disposition: A | Discharge status: Home / Self Care | Payer: Medicare Other | Source: Ambulatory Visit | Attending: Radiation Oncology | Admitting: Radiation Oncology

## 2021-06-15 ENCOUNTER — Other Ambulatory Visit: Payer: Self-pay

## 2021-06-15 ENCOUNTER — Encounter: Payer: Self-pay | Admitting: Radiation Oncology

## 2021-06-15 DIAGNOSIS — Z79899 Other long term (current) drug therapy: Secondary | ICD-10-CM | POA: Diagnosis not present

## 2021-06-15 DIAGNOSIS — R918 Other nonspecific abnormal finding of lung field: Secondary | ICD-10-CM | POA: Diagnosis present

## 2021-06-15 DIAGNOSIS — M810 Age-related osteoporosis without current pathological fracture: Secondary | ICD-10-CM | POA: Insufficient documentation

## 2021-06-15 DIAGNOSIS — E079 Disorder of thyroid, unspecified: Secondary | ICD-10-CM | POA: Diagnosis not present

## 2021-06-15 DIAGNOSIS — Z87891 Personal history of nicotine dependence: Secondary | ICD-10-CM | POA: Insufficient documentation

## 2021-06-15 DIAGNOSIS — D649 Anemia, unspecified: Secondary | ICD-10-CM | POA: Diagnosis not present

## 2021-06-15 DIAGNOSIS — R911 Solitary pulmonary nodule: Secondary | ICD-10-CM

## 2021-06-15 DIAGNOSIS — K219 Gastro-esophageal reflux disease without esophagitis: Secondary | ICD-10-CM | POA: Diagnosis not present

## 2021-06-15 DIAGNOSIS — M5136 Other intervertebral disc degeneration, lumbar region: Secondary | ICD-10-CM | POA: Diagnosis not present

## 2021-06-15 DIAGNOSIS — J449 Chronic obstructive pulmonary disease, unspecified: Secondary | ICD-10-CM | POA: Insufficient documentation

## 2021-06-15 DIAGNOSIS — M549 Dorsalgia, unspecified: Secondary | ICD-10-CM | POA: Diagnosis not present

## 2021-06-15 DIAGNOSIS — I10 Essential (primary) hypertension: Secondary | ICD-10-CM | POA: Diagnosis not present

## 2021-06-15 DIAGNOSIS — G2581 Restless legs syndrome: Secondary | ICD-10-CM | POA: Diagnosis not present

## 2021-06-15 DIAGNOSIS — R011 Cardiac murmur, unspecified: Secondary | ICD-10-CM | POA: Insufficient documentation

## 2021-06-15 NOTE — Consult Note (Signed)
NEW PATIENT EVALUATION  Name: [name]  MRN: [id number]  Date:   06/15/2021     DOB: [birth year]   This 75 y.o. female patient presents to the clinic for initial evaluation of presumed stage I non-small cell lung cancer of the left lower lobe.  REFERRING PHYSICIAN: Baxter Hire, MD  CHIEF COMPLAINT:  Chief Complaint  Patient presents with   Lung Cancer    Initial consult    DIAGNOSIS: The encounter diagnosis was Lung nodule.   PREVIOUS INVESTIGATIONS:  CT scans PET CT scans reviewed Clinical notes reviewed Labs reviewed  HPI: Patient is a 75 year old obese female with multiple medical comorbidities including COPD.  As part of a work-up for anemia she had a CT scan of her chest back in March 2022 showing a spiculated left upper lobe lung nodule measuring 1.7 x 1.3 cm.  This is grown slightly from a prior CT scan back 6 months prior.  She had a PET CT scan in May which showed hypermetabolic activity in the left upper lobe nodule.  Most recent CT scan showed lesion is up to 2.0 x 1.4 cm remaining highly concerning for primary lung malignancy.  She has no evidence of mediastinal or hilar adenopathy.  She does have COPD but specifically [place] cough hemoptysis or chest tightness.  She is seen today accompanied by her daughter for evaluation.  PLANNED TREATMENT REGIMEN: SBRT  PAST MEDICAL HISTORY:  has a past medical history of Allergy, Asthma, Back pain, Bronchitis, COPD (chronic obstructive pulmonary disease) ([place]), Degenerative disc disease, lumbar, Depression, FHx: cholecystectomy, GERD (gastroesophageal reflux disease), H/O: hysterectomy, Headache, Hypertension, Murmur, Osteoporosis, Pneumonia (03/31/16), Restless legs syndrome, Scoliosis, Shingles, and Thyroid disease.    PAST SURGICAL HISTORY:  Past Surgical History:  Procedure Laterality Date   ABDOMINAL HYSTERECTOMY     APPENDECTOMY     BACK SURGERY     CHOLECYSTECTOMY     COLONOSCOPY WITH PROPOFOL N/A 01/16/2018    Procedure: COLONOSCOPY WITH PROPOFOL;  Surgeon: Toledo, Benay Pike, MD;  Location: [facility] ENDOSCOPY;  Service: Gastroenterology;  Laterality: N/A;   COLONOSCOPY WITH PROPOFOL N/A 01/21/2021   Procedure: COLONOSCOPY WITH PROPOFOL;  Surgeon: Lin Landsman, MD;  Location: [facility] ENDOSCOPY;  Service: Gastroenterology;  Laterality: N/A;   ESOPHAGOGASTRODUODENOSCOPY (EGD) WITH PROPOFOL N/A 01/16/2018   Procedure: ESOPHAGOGASTRODUODENOSCOPY (EGD) WITH PROPOFOL;  Surgeon: Toledo, Benay Pike, MD;  Location: [facility] ENDOSCOPY;  Service: Gastroenterology;  Laterality: N/A;   ESOPHAGOGASTRODUODENOSCOPY (EGD) WITH PROPOFOL N/A 01/21/2021   Procedure: ESOPHAGOGASTRODUODENOSCOPY (EGD) WITH PROPOFOL;  Surgeon: Lin Landsman, MD;  Location: [facility] ENDOSCOPY;  Service: Gastroenterology;  Laterality: N/A;   JOINT REPLACEMENT Left 1998   shoulder   NECK SURGERY Bilateral    ROTATOR CUFF REPAIR Right    SHOULDER OPEN ROTATOR CUFF REPAIR Right    SHOULDER SURGERY Left    replacement    FAMILY HISTORY: family history includes Diabetes in her mother; Heart disease in her mother; Kidney disease in her father.  SOCIAL HISTORY:  reports that she quit smoking about 4 years ago. Her smoking use included cigarettes. She has a 30.00 pack-year smoking history. She has never used smokeless tobacco. She reports that she does not drink alcohol and does not use drugs.  ALLERGIES: Levofloxacin, Adhesive [tape], and Other  MEDICATIONS:  Current Outpatient Medications  Medication Sig Dispense Refill   albuterol (PROVENTIL HFA;VENTOLIN HFA) 108 (90 Base) MCG/ACT inhaler Inhale 1-2 puffs into the lungs every 6 (six) hours as needed for wheezing or shortness of breath.  amLODipine (NORVASC) 5 MG tablet Take 5 mg by mouth daily.     bisacodyl (DULCOLAX) 10 MG suppository Place 1 suppository (10 mg total) rectally daily as needed for severe constipation. (Patient not taking: No sig reported) 12 suppository 0   bisacodyl (DULCOLAX)  5 MG EC tablet Take 1 tablet (5 mg total) by mouth daily as needed for moderate constipation. 30 tablet 0   Cholecalciferol (VITAMIN D) 2000 UNITS tablet Take 2,000 Units by mouth daily.     citalopram (CELEXA) 40 MG tablet Take 40 mg by mouth daily.     Fluticasone-Salmeterol (ADVAIR DISKUS) 250-50 MCG/DOSE AEPB Inhale 1 puff into the lungs 2 (two) times daily. (Patient not taking: No sig reported) 60 each 0   furosemide (LASIX) 20 MG tablet Take 20 mg by mouth daily.     gabapentin (NEURONTIN) 100 MG capsule Take 2 capsules (200 mg total) by mouth at bedtime. 30 capsule 0   INCRUSE ELLIPTA 62.5 MCG/INH AEPB Inhale 1 puff into the lungs daily.     levothyroxine (SYNTHROID, LEVOTHROID) 112 MCG tablet Take 112 mcg by mouth daily before breakfast.     metoprolol tartrate (LOPRESSOR) 100 MG tablet Take 100 mg by mouth 2 (two) times daily.     Multiple Vitamin (MULTIVITAMIN) tablet Take 1 tablet by mouth daily. Reported on 04/05/2016     omeprazole (PRILOSEC) 20 MG capsule Take 20 mg by mouth daily.      potassium chloride SA (K-DUR,KLOR-CON) 20 MEQ tablet Take 20 mEq by mouth daily.     senna-docusate (SENOKOT-S) 8.6-50 MG tablet Take 1 tablet by mouth daily. (Patient not taking: No sig reported)     tiotropium (SPIRIVA HANDIHALER) 18 MCG inhalation capsule Place 1 capsule (18 mcg total) into inhaler and inhale daily. (Patient not taking: No sig reported) 30 capsule 12   traZODone (DESYREL) 100 MG tablet Take 100 mg by mouth at bedtime.     vitamin B-12 1000 MCG tablet Take 1 tablet (1,000 mcg total) by mouth daily. (Patient not taking: No sig reported) 30 tablet 1   No current facility-administered medications for this encounter.    ECOG PERFORMANCE STATUS:  0 - Asymptomatic  REVIEW OF SYSTEMS: Patient does have history of hypertension hypothyroidism COPD and recent diagnosis of anemia. Patient denies any weight loss, fatigue, weakness, fever, chills or night sweats. Patient denies any loss of  vision, blurred vision. Patient denies any ringing  of the ears or hearing loss. No irregular heartbeat. Patient denies heart murmur or history of fainting. Patient denies any chest pain or pain radiating to her upper extremities. Patient denies any shortness of breath, difficulty breathing at night, cough or hemoptysis. Patient denies any swelling in the lower legs. Patient denies any nausea vomiting, vomiting of blood, or coffee ground material in the vomitus. Patient denies any stomach pain. Patient states has had normal bowel movements no significant constipation or diarrhea. Patient denies any dysuria, hematuria or significant nocturia. Patient denies any problems walking, swelling in the joints or loss of balance. Patient denies any skin changes, loss of hair or loss of weight. Patient denies any excessive worrying or anxiety or significant depression. Patient denies any problems with insomnia. Patient denies excessive thirst, polyuria, polydipsia. Patient denies any swollen glands, patient denies easy bruising or easy bleeding. Patient denies any recent infections, allergies or URI. Patient "s visual fields have not changed significantly in recent time.   PHYSICAL EXAM: BP (!) (P) 129/51 (BP Location: Right Arm, Patient Position: Sitting)  Pulse (P) 74   Resp (!) (P) 22   Wt (P) 205 lb 14.4 oz (93.4 kg)   BMI (P) 43.03 kg/m  Obese female in NAD on nasal oxygen.  Well-developed well-nourished patient in NAD. HEENT reveals PERLA, EOMI, discs not visualized.  Oral cavity is clear. No oral mucosal lesions are identified. Neck is clear without evidence of cervical or supraclavicular adenopathy. Lungs are clear to A&P. Cardiac examination is essentially unremarkable with regular rate and rhythm without murmur rub or thrill. Abdomen is benign with no organomegaly or masses noted. Motor sensory and DTR levels are equal and symmetric in the upper and lower extremities. Cranial nerves II through XII are  grossly intact. Proprioception is intact. No peripheral adenopathy or edema is identified. No motor or sensory levels are noted. Crude visual fields are within normal range. LABORATORY DATA: Labs reviewed    RADIOLOGY RESULTS: Serial CT scans and PET CT scans reviewed compatible with above-stated findings   IMPRESSION: Stage I non-small cell lung cancer of the left upper lobe in 75 year old female with multiple medical comorbidities  PLAN: At this time I agree this is a stage I non-small cell lung cancer.  I would offer SBRT to 60 Gray in 5 fractions.  Use motion restriction 4-dimensional treatment planning.  Risks and benefits of treatment including slight chance of cough after completion of treatment slight fatigue were reviewed with the patient in detail.  Patient and daughter both comprehend my treatment plan well.  I personally set up and ordered CT simulation tomorrow.  I would like to take this opportunity to thank you for allowing me to participate in the care of your patient.Noreene Filbert, MD

## 2021-06-16 ENCOUNTER — Ambulatory Visit
Admission: RE | Admit: 2021-06-16 | Discharge: 2021-06-16 | Disposition: A | Discharge status: Home / Self Care | Payer: Medicare Other | Source: Ambulatory Visit | Attending: Radiation Oncology | Admitting: Radiation Oncology

## 2021-06-16 DIAGNOSIS — R911 Solitary pulmonary nodule: Secondary | ICD-10-CM | POA: Insufficient documentation

## 2021-06-16 DIAGNOSIS — Z510 Encounter for antineoplastic radiation therapy: Secondary | ICD-10-CM | POA: Diagnosis not present

## 2021-06-21 DIAGNOSIS — R911 Solitary pulmonary nodule: Secondary | ICD-10-CM | POA: Diagnosis not present

## 2021-06-27 ENCOUNTER — Ambulatory Visit
Admission: RE | Admit: 2021-06-27 | Discharge: 2021-06-27 | Disposition: A | Discharge status: Home / Self Care | Payer: Medicare Other | Source: Ambulatory Visit | Attending: Radiation Oncology | Admitting: Radiation Oncology

## 2021-06-27 DIAGNOSIS — R911 Solitary pulmonary nodule: Secondary | ICD-10-CM | POA: Diagnosis not present

## 2021-06-29 ENCOUNTER — Ambulatory Visit: Payer: Medicare Other

## 2021-06-29 ENCOUNTER — Ambulatory Visit: Admission: RE | Admit: 2021-06-29 | Payer: Medicare Other | Source: Ambulatory Visit

## 2021-06-30 ENCOUNTER — Ambulatory Visit: Payer: Medicare Other

## 2021-07-05 ENCOUNTER — Ambulatory Visit
Admission: RE | Admit: 2021-07-05 | Discharge: 2021-07-05 | Disposition: A | Discharge status: Home / Self Care | Payer: Medicare Other | Source: Ambulatory Visit | Attending: Radiation Oncology | Admitting: Radiation Oncology

## 2021-07-05 DIAGNOSIS — R911 Solitary pulmonary nodule: Secondary | ICD-10-CM | POA: Diagnosis not present

## 2021-07-05 DIAGNOSIS — Z510 Encounter for antineoplastic radiation therapy: Secondary | ICD-10-CM | POA: Diagnosis not present

## 2021-07-06 ENCOUNTER — Ambulatory Visit: Payer: Medicare Other

## 2021-07-07 ENCOUNTER — Ambulatory Visit
Admission: RE | Admit: 2021-07-07 | Discharge: 2021-07-07 | Disposition: A | Discharge status: Home / Self Care | Payer: Medicare Other | Source: Ambulatory Visit | Attending: Radiation Oncology | Admitting: Radiation Oncology

## 2021-07-07 DIAGNOSIS — R911 Solitary pulmonary nodule: Secondary | ICD-10-CM | POA: Diagnosis not present

## 2021-07-11 ENCOUNTER — Ambulatory Visit: Payer: Medicare Other

## 2021-07-11 ENCOUNTER — Ambulatory Visit
Admission: RE | Admit: 2021-07-11 | Discharge: 2021-07-11 | Disposition: A | Discharge status: Home / Self Care | Payer: Medicare Other | Source: Ambulatory Visit | Attending: Radiation Oncology | Admitting: Radiation Oncology

## 2021-07-11 DIAGNOSIS — R911 Solitary pulmonary nodule: Secondary | ICD-10-CM | POA: Diagnosis not present

## 2021-07-13 ENCOUNTER — Ambulatory Visit
Admission: RE | Admit: 2021-07-13 | Discharge: 2021-07-13 | Disposition: A | Discharge status: Home / Self Care | Payer: Medicare Other | Source: Ambulatory Visit | Attending: Radiation Oncology | Admitting: Radiation Oncology

## 2021-07-13 ENCOUNTER — Ambulatory Visit: Payer: Medicare Other

## 2021-07-13 DIAGNOSIS — R911 Solitary pulmonary nodule: Secondary | ICD-10-CM | POA: Diagnosis not present

## 2021-08-17 ENCOUNTER — Other Ambulatory Visit: Payer: Self-pay

## 2021-08-17 ENCOUNTER — Encounter: Payer: Self-pay | Admitting: Radiation Oncology

## 2021-08-17 ENCOUNTER — Ambulatory Visit
Admission: RE | Admit: 2021-08-17 | Discharge: 2021-08-17 | Disposition: A | Discharge status: Home / Self Care | Payer: Medicare Other | Source: Ambulatory Visit | Attending: Radiation Oncology | Admitting: Radiation Oncology

## 2021-08-17 VITALS — BP 96/84 | HR 75 | Temp 96.6°F | Resp 20

## 2021-08-17 DIAGNOSIS — R911 Solitary pulmonary nodule: Secondary | ICD-10-CM | POA: Insufficient documentation

## 2021-08-17 DIAGNOSIS — Z923 Personal history of irradiation: Secondary | ICD-10-CM | POA: Insufficient documentation

## 2021-08-17 NOTE — Progress Notes (Signed)
Radiation Oncology Follow up Note  Name: [name]   Date:   08/17/2021 MRN:  [id number] DOB: 11/24/1945    This 75 y.o. female presents to the clinic today for 1 month follow-up status post SBRT to an stage I non-small cell lung cancer of the left lower lobe.  REFERRING PROVIDER: Baxter Hire, MD  HPI: Patient is a 75 year old female now out 1 month having completed SBRT to her left lower lobe for a stage I non-small cell lung cancer seen today in routine follow-up she is doing fair.  She is on nasal oxygen.  She has had no change in her pulmonary status according to the patient no cough hemoptysis or chest tightness..  COMPLICATIONS OF TREATMENT: none  FOLLOW UP COMPLIANCE: keeps appointments   PHYSICAL EXAM:  BP 96/84 (BP Location: Left Arm, Patient Position: Sitting)   Pulse 75   Temp (!) 96.6 F (35.9 C) (Tympanic)   Resp 6  Wheelchair-bound female on nasal oxygen in NAD.  Well-developed well-nourished patient in NAD. HEENT reveals PERLA, EOMI, discs not visualized.  Oral cavity is clear. No oral mucosal lesions are identified. Neck is clear without evidence of cervical or supraclavicular adenopathy. Lungs are clear to A&P. Cardiac examination is essentially unremarkable with regular rate and rhythm without murmur rub or thrill. Abdomen is benign with no organomegaly or masses noted. Motor sensory and DTR levels are equal and symmetric in the upper and lower extremities. Cranial nerves II through XII are grossly intact. Proprioception is intact. No peripheral adenopathy or edema is identified. No motor or sensory levels are noted. Crude visual fields are within normal range.  RADIOLOGY RESULTS: No current films for review.  CT scan has been ordered in November  PLAN: Present time patient is doing well very low side effect profile from her SBRT.  I will review her scans in November when they become available.  I have asked to see her back in 3 months for follow-up.  Patient  knows to call with any concerns.  I would like to take this opportunity to thank you for allowing me to participate in the care of your patient.Noreene Filbert, MD

## 2021-09-14 ENCOUNTER — Other Ambulatory Visit: Payer: Self-pay

## 2021-09-14 ENCOUNTER — Ambulatory Visit
Admission: RE | Admit: 2021-09-14 | Discharge: 2021-09-14 | Disposition: A | Discharge status: Home / Self Care | Payer: Medicare Other | Source: Ambulatory Visit | Attending: Oncology | Admitting: Oncology

## 2021-09-14 DIAGNOSIS — R911 Solitary pulmonary nodule: Secondary | ICD-10-CM | POA: Insufficient documentation

## 2021-09-19 ENCOUNTER — Encounter: Payer: Self-pay | Admitting: Oncology

## 2021-09-19 ENCOUNTER — Other Ambulatory Visit: Payer: Self-pay

## 2021-09-19 ENCOUNTER — Inpatient Hospital Stay: Payer: Medicare Other | Attending: Oncology | Admitting: Oncology

## 2021-09-19 VITALS — BP 117/75 | HR 67 | Temp 97.2°F | Resp 20 | Wt 200.1 lb

## 2021-09-19 DIAGNOSIS — Z87891 Personal history of nicotine dependence: Secondary | ICD-10-CM | POA: Insufficient documentation

## 2021-09-19 DIAGNOSIS — M5136 Other intervertebral disc degeneration, lumbar region: Secondary | ICD-10-CM | POA: Diagnosis not present

## 2021-09-19 DIAGNOSIS — M810 Age-related osteoporosis without current pathological fracture: Secondary | ICD-10-CM | POA: Insufficient documentation

## 2021-09-19 DIAGNOSIS — R5383 Other fatigue: Secondary | ICD-10-CM | POA: Diagnosis not present

## 2021-09-19 DIAGNOSIS — C3412 Malignant neoplasm of upper lobe, left bronchus or lung: Secondary | ICD-10-CM | POA: Diagnosis present

## 2021-09-19 DIAGNOSIS — E039 Hypothyroidism, unspecified: Secondary | ICD-10-CM | POA: Insufficient documentation

## 2021-09-19 DIAGNOSIS — I700 Atherosclerosis of aorta: Secondary | ICD-10-CM | POA: Diagnosis not present

## 2021-09-19 DIAGNOSIS — R0602 Shortness of breath: Secondary | ICD-10-CM | POA: Diagnosis not present

## 2021-09-19 DIAGNOSIS — I10 Essential (primary) hypertension: Secondary | ICD-10-CM | POA: Insufficient documentation

## 2021-09-19 DIAGNOSIS — Z79899 Other long term (current) drug therapy: Secondary | ICD-10-CM | POA: Insufficient documentation

## 2021-09-19 DIAGNOSIS — G2581 Restless legs syndrome: Secondary | ICD-10-CM | POA: Insufficient documentation

## 2021-09-19 DIAGNOSIS — J449 Chronic obstructive pulmonary disease, unspecified: Secondary | ICD-10-CM | POA: Diagnosis not present

## 2021-09-19 DIAGNOSIS — K449 Diaphragmatic hernia without obstruction or gangrene: Secondary | ICD-10-CM | POA: Diagnosis not present

## 2021-09-19 DIAGNOSIS — Z7952 Long term (current) use of systemic steroids: Secondary | ICD-10-CM | POA: Diagnosis not present

## 2021-09-19 DIAGNOSIS — D649 Anemia, unspecified: Secondary | ICD-10-CM | POA: Diagnosis not present

## 2021-09-19 DIAGNOSIS — E079 Disorder of thyroid, unspecified: Secondary | ICD-10-CM | POA: Insufficient documentation

## 2021-09-19 DIAGNOSIS — K219 Gastro-esophageal reflux disease without esophagitis: Secondary | ICD-10-CM | POA: Diagnosis not present

## 2021-09-19 NOTE — Progress Notes (Signed)
Hematology/Oncology Consult note [facility]  Telephone:([phone] [phone]  Patient Care Team: Baxter Hire, MD as PCP - General (Internal Medicine) Telford Nab, RN as Oncology Nurse Navigator Ottie Glazier, MD as Consulting Physician (Pulmonary Disease)   Name of the patient: [name]  [id number]  1946/06/07   Date of visit: 09/19/21  Diagnosis-presumed diagnosis of lung cancer s/p SBRT  Chief complaint/ Reason for visit- discuss ct scan results and further management  Heme/Onc history: Patient is a 75 year old female with a past medical history significant for hypertension hypothyroidism COPD among other medical problems.  She was admitted to the hospital in March 2022 for worsening anemia and required 2 units of PRBC transfusion.  EGD and colonoscopy was otherwise unremarkable.  She was also started on home oxygen at that time and is currently on 4 L.  She had a CT angio chest PE done during that hospitalization which showed no evidence of PE but a spiculated left upper lobe lung nodule measuring 1.7 x 1.3 cm which was larger as compared to prior size of 1.4 x 0.9 cm 6 months ago.  During her hospitalization her ferritin levels were low at 4 folate and B12 were low as well.  H&H was 8.3/27.6 after blood transfusion.  She has subsequently had her CBC checked on 02/14/2021 when H&H was improved to 11/37.3.    Results of anemia work-up including ferritin and iron studies B12 folate myeloma panel haptoglobin and reticulocyte count was unremarkable and patient's hemoglobin had improved to 11.9 from a prior value of 9.7.   PET scan showed a solitary posterior left upper lobe lung nodule 2 x 1.7 cm in size with an SUV of 7.4 but no other evidence of locoregional adenopathy or distant metastatic disease  Interval history-patient has baseline fatigue and exertional shortness of breath.  ECOG PS- 2 Pain scale- 0   Review of systems- Review  of Systems  Constitutional:  Positive for malaise/fatigue. Negative for chills, fever and weight loss.  HENT:  Negative for congestion, ear discharge and nosebleeds.   Eyes:  Negative for blurred vision.  Respiratory:  Positive for shortness of breath. Negative for cough, hemoptysis, sputum production and wheezing.   Cardiovascular:  Negative for chest pain, palpitations, orthopnea and claudication.  Gastrointestinal:  Negative for abdominal pain, blood in stool, constipation, diarrhea, heartburn, melena, nausea and vomiting.  Genitourinary:  Negative for dysuria, flank pain, frequency, hematuria and urgency.  Musculoskeletal:  Negative for back pain, joint pain and myalgias.  Skin:  Negative for rash.  Neurological:  Negative for dizziness, tingling, focal weakness, seizures, weakness and headaches.  Endo/Heme/Allergies:  Does not bruise/bleed easily.  Psychiatric/Behavioral:  Negative for depression and suicidal ideas. The patient does not have insomnia.      Allergies  Allergen Reactions   Levofloxacin Shortness Of Breath   Adhesive [Tape] Other (See Comments)   Other Rash    Plastic tape     Past Medical History:  Diagnosis Date   Allergy    Asthma    Back pain    Bronchitis    COPD (chronic obstructive pulmonary disease) (HCC)    Degenerative disc disease, lumbar    Depression    FHx: cholecystectomy    GERD (gastroesophageal reflux disease)    H/O: hysterectomy    Headache    Hypertension    Murmur    Osteoporosis    Pneumonia 03/31/16   being treated by PCP   Restless legs syndrome  Scoliosis    Shingles    Thyroid disease      Past Surgical History:  Procedure Laterality Date   ABDOMINAL HYSTERECTOMY     APPENDECTOMY     BACK SURGERY     CHOLECYSTECTOMY     COLONOSCOPY WITH PROPOFOL N/A 01/16/2018   Procedure: COLONOSCOPY WITH PROPOFOL;  Surgeon: Toledo, Benay Pike, MD;  Location: [facility] ENDOSCOPY;  Service: Gastroenterology;  Laterality: N/A;    COLONOSCOPY WITH PROPOFOL N/A 01/21/2021   Procedure: COLONOSCOPY WITH PROPOFOL;  Surgeon: Lin Landsman, MD;  Location: [facility] ENDOSCOPY;  Service: Gastroenterology;  Laterality: N/A;   ESOPHAGOGASTRODUODENOSCOPY (EGD) WITH PROPOFOL N/A 01/16/2018   Procedure: ESOPHAGOGASTRODUODENOSCOPY (EGD) WITH PROPOFOL;  Surgeon: Toledo, Benay Pike, MD;  Location: [facility] ENDOSCOPY;  Service: Gastroenterology;  Laterality: N/A;   ESOPHAGOGASTRODUODENOSCOPY (EGD) WITH PROPOFOL N/A 01/21/2021   Procedure: ESOPHAGOGASTRODUODENOSCOPY (EGD) WITH PROPOFOL;  Surgeon: Lin Landsman, MD;  Location: [facility] ENDOSCOPY;  Service: Gastroenterology;  Laterality: N/A;   JOINT REPLACEMENT Left 1998   shoulder   NECK SURGERY Bilateral    ROTATOR CUFF REPAIR Right    SHOULDER OPEN ROTATOR CUFF REPAIR Right    SHOULDER SURGERY Left    replacement    Social History   Socioeconomic History   Marital status: Widowed    Spouse name: Not on file   Number of children: Not on file   Years of education: Not on file   Highest education level: Not on file  Occupational History   Not on file  Tobacco Use   Smoking status: Former    Packs/day: 1.00    Years: 30.00    Pack years: 30.00    Types: Cigarettes    Quit date: 11/04/2016    Years since quitting: 4.8   Smokeless tobacco: Never  Vaping Use   Vaping Use: Never used  Substance and Sexual Activity   Alcohol use: No    Alcohol/week: 0.0 standard drinks   Drug use: No   Sexual activity: Not on file  Other Topics Concern   Not on file  Social History Narrative   Not on file   Social Determinants of Health   Financial Resource Strain: Not on file  Food Insecurity: Not on file  Transportation Needs: Not on file  Physical Activity: Not on file  Stress: Not on file  Social Connections: Not on file  Intimate Partner Violence: Not on file    Family History  Problem Relation Age of Onset   Kidney disease Father    Heart disease Mother    Diabetes Mother       Current Outpatient Medications:    albuterol (PROVENTIL HFA;VENTOLIN HFA) 108 (90 Base) MCG/ACT inhaler, Inhale 1-2 puffs into the lungs every 6 (six) hours as needed for wheezing or shortness of breath., Disp: , Rfl:    amLODipine (NORVASC) 5 MG tablet, Take 5 mg by mouth daily., Disp: , Rfl:    bisacodyl (DULCOLAX) 5 MG EC tablet, Take 1 tablet (5 mg total) by mouth daily as needed for moderate constipation., Disp: 30 tablet, Rfl: 0   Cholecalciferol (VITAMIN D) 2000 UNITS tablet, Take 2,000 Units by mouth daily., Disp: , Rfl:    citalopram (CELEXA) 40 MG tablet, Take 40 mg by mouth daily., Disp: , Rfl:    furosemide (LASIX) 20 MG tablet, Take 20 mg by mouth daily., Disp: , Rfl:    gabapentin (NEURONTIN) 100 MG capsule, Take 2 capsules (200 mg total) by mouth at bedtime., Disp: 30 capsule, Rfl: 0  levothyroxine (SYNTHROID, LEVOTHROID) 112 MCG tablet, Take 112 mcg by mouth daily before breakfast., Disp: , Rfl:    metoprolol tartrate (LOPRESSOR) 100 MG tablet, Take 100 mg by mouth 2 (two) times daily., Disp: , Rfl:    Multiple Vitamin (MULTIVITAMIN) tablet, Take 1 tablet by mouth daily. Reported on 04/05/2016, Disp: , Rfl:    nystatin cream (MYCOSTATIN), Apply topically 3 (three) times daily., Disp: , Rfl:    omeprazole (PRILOSEC) 20 MG capsule, Take 20 mg by mouth daily. , Disp: , Rfl:    potassium chloride SA (K-DUR,KLOR-CON) 20 MEQ tablet, Take 20 mEq by mouth daily., Disp: , Rfl:    predniSONE (DELTASONE) 20 MG tablet, Take by mouth., Disp: , Rfl:    sulfamethoxazole-trimethoprim (BACTRIM) 400-80 MG tablet, Take by mouth., Disp: , Rfl:    traZODone (DESYREL) 100 MG tablet, Take 100 mg by mouth at bedtime., Disp: , Rfl:    bisacodyl (DULCOLAX) 10 MG suppository, Place 1 suppository (10 mg total) rectally daily as needed for severe constipation. (Patient not taking: No sig reported), Disp: 12 suppository, Rfl: 0   Fluticasone-Salmeterol (ADVAIR DISKUS) 250-50 MCG/DOSE AEPB, Inhale 1 puff  into the lungs 2 (two) times daily. (Patient not taking: Reported on 03/15/2021), Disp: 60 each, Rfl: 0   INCRUSE ELLIPTA 62.5 MCG/INH AEPB, Inhale 1 puff into the lungs daily. (Patient not taking: Reported on 09/19/2021), Disp: , Rfl:    senna-docusate (SENOKOT-S) 8.6-50 MG tablet, Take 1 tablet by mouth daily. (Patient not taking: Reported on 03/15/2021), Disp: , Rfl:    tiotropium (SPIRIVA HANDIHALER) 18 MCG inhalation capsule, Place 1 capsule (18 mcg total) into inhaler and inhale daily. (Patient not taking: Reported on 03/24/2021), Disp: 30 capsule, Rfl: 12   vitamin B-12 1000 MCG tablet, Take 1 tablet (1,000 mcg total) by mouth daily. (Patient not taking: Reported on 03/24/2021), Disp: 30 tablet, Rfl: 1  Physical exam:  Vitals:   09/19/21 1137  BP: 117/75  Pulse: 67  Resp: 20  Temp: (!) 97.2 F (36.2 C)  SpO2: 97%  Weight: 200 lb 1.6 oz (90.8 kg)   Physical Exam Constitutional:      General: She is not in acute distress. Cardiovascular:     Rate and Rhythm: Normal rate and regular rhythm.     Heart sounds: Normal heart sounds.  Pulmonary:     Comments: Patient is on oxygen.  Effort of breathing increased.  Breath sounds decreased bilaterally diffusely. Skin:    General: Skin is warm and dry.  Neurological:     Mental Status: She is alert and oriented to person, place, and time.     CMP Latest Ref Rng & Units 01/23/2021  Glucose 70 - 99 mg/dL 99  BUN 8 - 23 mg/dL 6(L)  Creatinine 0.44 - 1.00 mg/dL 0.59  Sodium 135 - 145 mmol/L 134(L)  Potassium 3.5 - 5.1 mmol/L 3.8  Chloride 98 - 111 mmol/L 96(L)  CO2 22 - 32 mmol/L 32  Calcium 8.9 - 10.3 mg/dL 8.4(L)  Total Protein 6.5 - 8.1 g/dL -  Total Bilirubin 0.3 - 1.2 mg/dL -  Alkaline Phos 38 - 126 U/L -  AST 15 - 41 U/L -  ALT 0 - 44 U/L -   CBC Latest Ref Rng & Units 03/15/2021  WBC 4.0 - 10.5 K/uL 6.3  Hemoglobin 12.0 - 15.0 g/dL 11.9(L)  Hematocrit 36.0 - 46.0 % 39.1  Platelets 150 - 400 K/uL 221    No images are  attached to the encounter.  CT Chest Wo Contrast  Result Date: 09/14/2021 CLINICAL DATA:  Recently diagnosed with lung cancer post radiation therapy. Lung nodule. EXAM: CT CHEST WITHOUT CONTRAST TECHNIQUE: Multidetector CT imaging of the chest was performed following the standard protocol without IV contrast. COMPARISON:  Chest CT 06/01/2021 and 01/22/2021.  PET-CT 03/23/2021. FINDINGS: Cardiovascular: Atherosclerosis of the aorta, great vessels and coronary arteries. No acute vascular findings on noncontrast imaging. The heart size is normal. There is no pericardial effusion. Mediastinum/Nodes: There are no enlarged mediastinal, hilar or axillary lymph nodes.Small mediastinal lymph nodes are unchanged. Hilar assessment is limited by the lack of intravenous contrast, although the hilar contours appear unchanged. Stable large paraesophageal hernia. The thyroid gland and trachea appear unremarkable. Lungs/Pleura: No pleural effusion or pneumothorax. There is mild centrilobular emphysema and diffuse central airway thickening. The spiculated nodule posteriorly in the left upper lobe has decreased in size and is less well-defined, measuring approximately 1.7 x 1.2 cm on image 31/3 (previously 2.0 x 1.4 cm). No new or enlarging pulmonary nodules. Upper abdomen: The visualized upper abdomen appears stable without acute findings. There is chronic extrahepatic biliary dilatation post cholecystectomy. There is mild contour irregularity of the liver and colonic interposition between the liver and anterior abdominal wall. Musculoskeletal/Chest wall: There is no chest wall mass or suspicious osseous finding. Chronic T11 compression deformity with focal kyphosis in the upper lumbar spine and multilevel spondylosis. Previous lower cervical fusion and left total shoulder arthroplasty. IMPRESSION: 1. The spiculated nodule left upper lobe has decreased in size, consistent with response to therapy. 2. No evidence of metastatic  disease or acute findings. 3. Large paraesophageal hernia. 4. Stable chronic compression deformity at T11. No acute osseous findings. 5. Coronary and Aortic Atherosclerosis (ICD10-I70.0). Electronically Signed   By: Richardean Sale M.D.   On: 09/14/2021 16:48     Assessment and plan- Patient is a 75 y.o. female with spiculated left upper lobe lung nodule presumed lung cancer s/p SBRT  Repeat CT scan after SBRT shows mild increase in the size of the lung nodule previously 2 x 1.4 cm now down to 1.7 x 1.2 cm.  No other progressive findings seen.  Plan is to continue monitoring this without any additional treatment at this time.  I will repeat CT chest without contrast in 4 months and see her thereafter   Visit Diagnosis 1. Primary cancer of left upper lobe of lung ([place])      Dr. Randa Evens, MD, MPH [facility] at [facility] [phone] 09/19/2021 4:52 PM

## 2021-10-17 ENCOUNTER — Encounter: Payer: Medicare Other | Attending: Physician Assistant | Admitting: Physician Assistant

## 2021-10-17 ENCOUNTER — Other Ambulatory Visit: Payer: Self-pay

## 2021-10-17 DIAGNOSIS — G629 Polyneuropathy, unspecified: Secondary | ICD-10-CM | POA: Diagnosis not present

## 2021-10-17 DIAGNOSIS — L89302 Pressure ulcer of unspecified buttock, stage 2: Secondary | ICD-10-CM | POA: Insufficient documentation

## 2021-10-17 NOTE — Progress Notes (Signed)
LEISEL, PINETTE ([id number]) Visit Report for 10/17/2021 Abuse/Suicide Risk Screen Details Patient Name: [name], [name]. Date of Service: 10/17/2021 10:00 AM Medical Record Number: [id number] Patient Account Number: [id number] Date of Birth/Sex: [birth year] (75 y.o. F) Treating RN: Donnamarie Poag Primary Care Nabil Bubolz: Harrel Lemon Other Clinician: Referring Ceaser Ebeling: Harrel Lemon Treating Milaya Hora/Extender: Skipper Cliche in Treatment: 0 Abuse/Suicide Risk Screen Items Answer ABUSE RISK SCREEN: Has anyone close to you tried to hurt or harm you recentlyo No Do you feel uncomfortable with anyone in your familyo No Has anyone forced you do things that you didnot want to doo No Electronic Signature(s) Signed: 10/17/2021 1:32:14 PM By: Donnamarie Poag Entered By: Donnamarie Poag on 10/17/2021 10:31:56 Sturges, [name] ([id number]) -------------------------------------------------------------------------------- Activities of Daily Living Details Patient Name: [name], [name]. Date of Service: 10/17/2021 10:00 AM Medical Record Number: [id number] Patient Account Number: [id number] Date of Birth/Sex: [birth year] (75 y.o. F) Treating RN: Donnamarie Poag Primary Care Lataria Courser: Harrel Lemon Other Clinician: Referring Dezeray Puccio: Harrel Lemon Treating Tiajuana Leppanen/Extender: Skipper Cliche in Treatment: 0 Activities of Daily Living Items Answer Activities of Daily Living (Please select one for each item) Drive Automobile Not Able Take Medications Need Assistance Use Telephone Completely Able Care for Appearance Completely Able Use Toilet Completely Able Bath / Shower Need Assistance Dress Self Completely Able Feed Self Completely Able Walk Need Assistance Get In / Out Bed Not Able Housework Not Able Prepare Meals Not Able Handle Money Need Assistance Shop for Self Not Able Electronic Signature(s) Signed: 10/17/2021 1:32:14 PM By: Donnamarie Poag Entered By: Donnamarie Poag on 10/17/2021  10:32:47 Knoble, [name] ([id number]) -------------------------------------------------------------------------------- Education Screening Details Patient Name: [name]. Date of Service: 10/17/2021 10:00 AM Medical Record Number: [id number] Patient Account Number: [id number] Date of Birth/Sex: 04-17-1946 (75 y.o. F) Treating RN: Donnamarie Poag Primary Care Tynell Winchell: Harrel Lemon Other Clinician: Referring Asta Corbridge: Harrel Lemon Treating Vici Novick/Extender: Skipper Cliche in Treatment: 0 Primary Learner Assessed: Patient Learning Preferences/Education Level/Primary Language Learning Preference: Explanation Highest Education Level: High School Preferred Language: English Cognitive Barrier Language Barrier: No Translator Needed: No Memory Deficit: No Emotional Barrier: No Cultural/Religious Beliefs Affecting Medical Care: No Physical Barrier Impaired Vision: No Impaired Hearing: Yes Decreased Hand dexterity: No Knowledge/Comprehension Knowledge Level: High Comprehension Level: High Ability to understand verbal instructions: High Motivation Anxiety Level: Calm Cooperation: Cooperative Education Importance: Acknowledges Need Interest in Health Problems: Asks Questions Perception: Coherent Willingness to Engage in Self-Management High Activities: Readiness to Engage in Self-Management High Activities: Electronic Signature(s) Signed: 10/17/2021 1:32:14 PM By: Donnamarie Poag Entered By: Donnamarie Poag on 10/17/2021 10:33:20 Kimbley, [name] ([id number]) -------------------------------------------------------------------------------- Fall Risk Assessment Details Patient Name: [name]. Date of Service: 10/17/2021 10:00 AM Medical Record Number: [id number] Patient Account Number: [id number] Date of Birth/Sex: 31-Oct-1945 (75 y.o. F) Treating RN: Donnamarie Poag Primary Care Nasreen Goedecke: Harrel Lemon Other Clinician: Referring Reyan Helle: Harrel Lemon Treating  Hazyl Marseille/Extender: Skipper Cliche in Treatment: 0 Fall Risk Assessment Items Have you had 2 or more falls in the last 12 monthso 0 Yes Have you had any fall that resulted in injury in the last 12 monthso 0 No FALLS RISK SCREEN History of falling - immediate or within 3 months 0 No Secondary diagnosis (Do you have 2 or more medical diagnoseso) 15 Yes Ambulatory aid None/bed rest/wheelchair/nurse 0 No Crutches/cane/walker 15 Yes Furniture 0 No Intravenous therapy Access/Saline/Heparin Lock 0 No Gait/Transferring Normal/ bed rest/ wheelchair 0 No Weak (short steps with or without shuffle, stooped but able to lift head while  walking, may 10 Yes seek support from furniture) Impaired (short steps with shuffle, may have difficulty arising from chair, head down, impaired 0 No balance) Mental Status Oriented to own ability 0 Yes Electronic Signature(s) Signed: 10/17/2021 1:32:14 PM By: Donnamarie Poag Entered By: Donnamarie Poag on 10/17/2021 10:33:40 Goldwire, [name] ([id number]) -------------------------------------------------------------------------------- Foot Assessment Details Patient Name: Breanna Cleverly H. Date of Service: 10/17/2021 10:00 AM Medical Record Number: [id number] Patient Account Number: [id number] Date of Birth/Sex: 05-25-1946 (75 y.o. F) Treating RN: Donnamarie Poag Primary Care Tyrea Froberg: Harrel Lemon Other Clinician: Referring Buell Parcel: Harrel Lemon Treating Naser Schuld/Extender: Jeri Cos Weeks in Treatment: 0 Foot Assessment Items Site Locations + = Sensation present, - = Sensation absent, C = Callus, U = Ulcer R = Redness, W = Warmth, M = Maceration, PU = Pre-ulcerative lesion F = Fissure, S = Swelling, D = Dryness Assessment Right: Left: Other Deformity: No No Prior Foot Ulcer: No No Prior Amputation: No No Charcot Joint: No No Ambulatory Status: Ambulatory With Help Assistance Device: Walker Gait: Electronic Signature(s) Signed: 10/17/2021 1:32:14 PM By:  Donnamarie Poag Entered By: Donnamarie Poag on 10/17/2021 10:34:15 Whipkey, [name] ([id number]) -------------------------------------------------------------------------------- Nutrition Risk Screening Details Patient Name: [name], [name] Date of Service: 10/17/2021 10:00 AM Medical Record Number: [id number] Patient Account Number: [id number] Date of Birth/Sex: [birth year] (75 y.o. F) Treating RN: Donnamarie Poag Primary Care Briseis Aguilera: Harrel Lemon Other Clinician: Referring Avanti Jetter: Harrel Lemon Treating Daris Harkins/Extender: Jeri Cos Weeks in Treatment: 0 Height (in): 58 Weight (lbs): 201 Body Mass Index (BMI): 42 Nutrition Risk Screening Items Score Screening NUTRITION RISK SCREEN: I have an illness or condition that made me change the kind and/or amount of food I eat 0 No I eat fewer than two meals per day 0 No I eat few fruits and vegetables, or milk products 0 No I have three or more drinks of beer, liquor or wine almost every day 0 No I have tooth or mouth problems that make it hard for me to eat 0 No I don't always have enough money to buy the food I need 0 No I eat alone most of the time 1 Yes I take three or more different prescribed or over-the-counter drugs a day 1 Yes Without wanting to, I have lost or gained 10 pounds in the last six months 0 No I am not always physically able to shop, cook and/or feed myself 2 Yes Nutrition Protocols Good Risk Protocol Moderate Risk Protocol 0 Provide education on nutrition High Risk Proctocol Risk Level: Moderate Risk Score: 4 Electronic Signature(s) Signed: 10/17/2021 1:32:14 PM By: Donnamarie Poag Entered ByDonnamarie Poag on 10/17/2021 10:33:58

## 2021-10-17 NOTE — Progress Notes (Signed)
JERRYE, SEEBECK ([id number]) Visit Report for 10/17/2021 Allergy List Details Patient Name: [name], [name]. Date of Service: 10/17/2021 10:00 AM Medical Record Number: [id number] Patient Account Number: [id number] Date of Birth/Sex: [birth year] (75 y.o. F) Treating RN: Donnamarie Poag Primary Care Ferlin Fairhurst: Harrel Lemon Other Clinician: Referring Monti Jilek: Harrel Lemon Treating Dahlton Hinde/Extender: Jeri Cos Weeks in Treatment: 0 Allergies Active Allergies Levaquin Reaction: unsure she stated Severity: Moderate Allergy Notes Electronic Signature(s) Signed: 10/17/2021 1:32:14 PM By: Donnamarie Poag Entered By: Donnamarie Poag on 10/17/2021 10:29:11 Canal, [name] ([id number]) -------------------------------------------------------------------------------- Arrival Information Details Patient Name: [name]. Date of Service: 10/17/2021 10:00 AM Medical Record Number: [id number] Patient Account Number: [id number] Date of Birth/Sex: 09-02-1946 (75 y.o. F) Treating RN: Donnamarie Poag Primary Care Kaylee Trivett: Harrel Lemon Other Clinician: Referring Iria Jamerson: Harrel Lemon Treating Juliany Daughety/Extender: Skipper Cliche in Treatment: 0 Visit Information Patient Arrived: Wheel Chair Arrival Time: 10:16 Accompanied By: self Transfer Assistance: None Patient Identification Verified: Yes Secondary Verification Process Completed: Yes Patient Requires Transmission-Based Precautions: No Patient Has Alerts: Yes Patient Alerts: NOT diabetic History Since Last Visit Added or deleted any medications: No Had a fall or experienced change in activities of daily living that may affect risk of falls: No Hospitalized since last visit: No Electronic Signature(s) Signed: 10/17/2021 1:32:14 PM By: Donnamarie Poag Entered By: Donnamarie Poag on 10/17/2021 10:27:23 [name], [name] ([id number]) -------------------------------------------------------------------------------- Clinic Level of Care Assessment  Details Patient Name: [name]. Date of Service: 10/17/2021 10:00 AM Medical Record Number: [id number] Patient Account Number: [id number] Date of Birth/Sex: 03/13/1946 (75 y.o. F) Treating RN: Donnamarie Poag Primary Care Aina Rossbach: Harrel Lemon Other Clinician: Referring Maclaine Ahola: Harrel Lemon Treating Kirrah Mustin/Extender: Skipper Cliche in Treatment: 0 Clinic Level of Care Assessment Items TOOL 2 Quantity Score _0  - Use when only an EandM is performed on the INITIAL visit 0 ASSESSMENTS - Nursing Assessment / Reassessment X - General Physical Exam (combine w/ comprehensive assessment (listed just below) when performed on new 1 20 pt. evals) X- 1 25 Comprehensive Assessment (HX, ROS, Risk Assessments, Wounds Hx, etc.) ASSESSMENTS - Wound and Skin Assessment / Reassessment X - Simple Wound Assessment / Reassessment - one wound 1 5 _1  - 0 Complex Wound Assessment / Reassessment - multiple wounds _2  - 0 Dermatologic / Skin Assessment (not related to wound area) ASSESSMENTS - Ostomy and/or Continence Assessment and Care _3  - Incontinence Assessment and Management 0 _4  - 0 Ostomy Care Assessment and Management (repouching, etc.) PROCESS - Coordination of Care X - Simple Patient / Family Education for ongoing care 1 15 _5  - 0 Complex (extensive) Patient / Family Education for ongoing care _6  - 0 Staff obtains Programmer, systems, Records, Test Results / Process Orders _7  - 0 Staff telephones HHA, Nursing Homes / Clarify orders / etc _8  - 0 Routine Transfer to another Facility (non-emergent condition) _9  - 0 Routine Hospital Admission (non-emergent condition) _10  - 0 New Admissions / Biomedical engineer / Ordering NPWT, Apligraf, etc. _11  - 0 Emergency Hospital Admission (emergent condition) X- 1 10 Simple Discharge Coordination _12  - 0 Complex (extensive) Discharge Coordination PROCESS - Special Needs _13  - Pediatric / Minor Patient Management 0 _14  - 0 Isolation Patient  Management _15  - 0 Hearing / Language / Visual special needs _16  - 0 Assessment of Community assistance (transportation, D/C planning, etc.) _17  - 0 Additional assistance / Altered mentation _18  - 0 Support Surface(s) Assessment (bed, cushion, seat, etc.) INTERVENTIONS - Wound Cleansing / Measurement X - Wound Imaging (photographs - any number  of wounds) 1 5 _0  - 0 Wound Tracing (instead of photographs) X- 1 5 Simple Wound Measurement - one wound _1  - 0 Complex Wound Measurement - multiple wounds [name], [name]. ([id number]) X- 1 5 Simple Wound Cleansing - one wound _2  - 0 Complex Wound Cleansing - multiple wounds INTERVENTIONS - Wound Dressings X - Small Wound Dressing one or multiple wounds 1 10 _3  - 0 Medium Wound Dressing one or multiple wounds _4  - 0 Large Wound Dressing one or multiple wounds <[id number]>_4<\/[id number]>_4  - 0 Application of Medications - injection INTERVENTIONS - Miscellaneous _6  - External ear exam 0 _7  - 0 Specimen Collection (cultures, biopsies, blood, body fluids, etc.) _8  - 0 Specimen(s) / Culture(s) sent or taken to Lab for analysis _9  - 0 Patient Transfer (multiple staff / Civil Service fast streamer / Similar devices) _10  - 0 Simple Staple / Suture removal (25 or less) _11  - 0 Complex Staple / Suture removal (26 or more) _12  - 0 Hypo / Hyperglycemic Management (close monitor of Blood Glucose) _13  - 0 Ankle / Brachial Index (ABI) - do not check if billed separately Has the patient been seen at the hospital within the last three years: Yes Total Score: 100 Level Of Care: New/Established - Level 3 Electronic Signature(s) Signed: 10/17/2021 1:32:14 PM By: Donnamarie Poag Entered By: Donnamarie Poag on 10/17/2021 11:09:48 [name], [name] ([id number]) -------------------------------------------------------------------------------- Encounter Discharge Information Details Patient Name: Breanna Cleverly [name]. Date of Service: 10/17/2021 10:00 AM Medical Record Number: [id number] Patient Account Number:  [id number] Date of Birth/Sex: Mar 15, 1946 (75 y.o. F) Treating RN: Donnamarie Poag Primary Care Margerite Impastato: Harrel Lemon Other Clinician: Referring Makia Bossi: Harrel Lemon Treating Demara Lover/Extender: Skipper Cliche in Treatment: 0 Encounter Discharge Information Items Post Procedure Vitals Discharge Condition: Stable Temperature (F): 98.1 Ambulatory Status: Wheelchair Pulse (bpm): 74 Discharge Destination: Home Respiratory Rate (breaths/min): 18 Transportation: Private Auto Blood Pressure (mmHg): 109/74 Accompanied By: self Schedule Follow-up Appointment: Yes Clinical Summary of Care: Electronic Signature(s) Signed: 10/17/2021 1:32:14 PM By: Donnamarie Poag Entered By: Donnamarie Poag on 10/17/2021 11:12:43 Messner, [name] ([id number]) -------------------------------------------------------------------------------- Lower Extremity Assessment Details Patient Name: ADONNA, HORSLEY [name]. Date of Service: 10/17/2021 10:00 AM Medical Record Number: [id number] Patient Account Number: [id number] Date of Birth/Sex: [birth year] (75 y.o. F) Treating RN: Donnamarie Poag Primary Care Maciej Schweitzer: Harrel Lemon Other Clinician: Referring Shavar Gorka: Harrel Lemon Treating Joli Koob/Extender: Jeri Cos Weeks in Treatment: 0 Edema Assessment Assessed: [Left: Yes] [Right: Yes] Edema: [Left: Yes] [Right: No] Electronic Signature(s) Signed: 10/17/2021 1:32:14 PM By: Donnamarie Poag Entered By: Donnamarie Poag on 10/17/2021 10:36:07 Sides, [name] ([id number]) -------------------------------------------------------------------------------- Multi Wound Chart Details Patient Name: Breanna Cleverly [name]. Date of Service: 10/17/2021 10:00 AM Medical Record Number: [id number] Patient Account Number: [id number] Date of Birth/Sex: [birth year] (75 y.o. F) Treating RN: Donnamarie Poag Primary Care Kayti Poss: Harrel Lemon Other Clinician: Referring Javarri Segal: Harrel Lemon Treating Pate Aylward/Extender: Skipper Cliche in Treatment:  0 Vital Signs Height(in): 67 Pulse(bpm): 55 Weight(lbs): 11 Blood Pressure(mmHg): 109/74 Body Mass Index(BMI): 42 Temperature(F): 98.1 Respiratory Rate(breaths/min): 20 Photos: [N/A:N/A] Wound Location: Left Gluteus N/A N/A Wounding Event: Gradually Appeared N/A N/A Primary Etiology: Pressure Ulcer N/A N/A Comorbid History: Anemia, Asthma, Chronic N/A N/A Obstructive Pulmonary Disease (COPD), Hypertension, Osteoarthritis, Neuropathy, Received Radiation Date Acquired: 09/26/2021 N/A N/A Weeks of Treatment: 0 N/A N/A Wound Status: Open N/A N/A Measurements L x W x D (cm) 0.5x0.5x0.1 N/A N/A Area (cm) : 0.196 N/A N/A Volume (cm) : 0.02 N/A N/A Classification: Category/Stage II N/A N/A Exudate Amount: Medium N/A N/A Exudate Type:  Serosanguineous N/A N/A Exudate Color: red, brown N/A N/A Granulation Amount: Large (67-100%) N/A N/A Granulation Quality: Red N/A N/A Necrotic Amount: None Present (0%) N/A N/A Exposed Structures: Fat Layer (Subcutaneous Tissue): N/A N/A Yes Fascia: No Tendon: No Muscle: No Joint: No Bone: No Treatment Notes Electronic Signature(s) Signed: 10/17/2021 1:32:14 PM By: Donnamarie Poag Entered By: Donnamarie Poag on 10/17/2021 10:47:27 [name], [name] ([id number]) -------------------------------------------------------------------------------- [place] Details Patient Name: [name]. Date of Service: 10/17/2021 10:00 AM Medical Record Number: [id number] Patient Account Number: [id number] Date of Birth/Sex: 11-13-1945 (75 y.o. F) Treating RN: Donnamarie Poag Primary Care Leman Martinek: Harrel Lemon Other Clinician: Referring Remon Quinto: Harrel Lemon Treating Riley Hallum/Extender: Skipper Cliche in Treatment: 0 Active Inactive Pressure Nursing Diagnoses: Knowledge deficit related to causes and risk factors for pressure ulcer development Knowledge deficit related to management of pressures ulcers Potential for impaired tissue  integrity related to pressure, friction, moisture, and shear Goals: Patient will remain free from development of additional pressure ulcers Date Initiated: 10/17/2021 Target Resolution Date: 11/04/2021 Goal Status: Active Patient/caregiver will verbalize risk factors for pressure ulcer development Date Initiated: 10/17/2021 Target Resolution Date: 11/11/2021 Goal Status: Active Interventions: Assess: immobility, friction, shearing, incontinence upon admission and as needed Notes: Wound/Skin Impairment Nursing Diagnoses: Impaired tissue integrity Knowledge deficit related to smoking impact on wound healing Knowledge deficit related to ulceration/compromised skin integrity Goals: Patient/caregiver will verbalize understanding of skin care regimen Date Initiated: 10/17/2021 Target Resolution Date: 11/04/2021 Goal Status: Active Ulcer/skin breakdown will have a volume reduction of 30% by week 4 Date Initiated: 10/17/2021 Target Resolution Date: 11/14/2021 Goal Status: Active Ulcer/skin breakdown will have a volume reduction of 50% by week 8 Date Initiated: 10/17/2021 Target Resolution Date: 12/12/2021 Goal Status: Active Ulcer/skin breakdown will have a volume reduction of 80% by week 12 Date Initiated: 10/17/2021 Target Resolution Date: 01/09/2022 Goal Status: Active Ulcer/skin breakdown will heal within 14 weeks Date Initiated: 10/17/2021 Target Resolution Date: 01/23/2022 Goal Status: Active Interventions: Assess patient/caregiver ability to obtain necessary supplies Assess patient/caregiver ability to perform ulcer/skin care regimen upon admission and as needed Assess ulceration(s) every visit Notes: Electronic Signature(s) [name], [name] ([id number]) Signed: 10/17/2021 1:32:14 PM By: Donnamarie Poag Entered By: Donnamarie Poag on 10/17/2021 10:47:08 [name], [name] ([id number]) -------------------------------------------------------------------------------- Non-Wound Condition  Assessment Details Patient Name: [name]. Date of Service: 10/17/2021 10:00 AM Medical Record Number: [id number] Patient Account Number: [id number] Date of Birth/Sex: 1946/07/14 (75 y.o. F) Treating RN: Donnamarie Poag Primary Care Gale Hulse: Harrel Lemon Other Clinician: Referring Kolden Dupee: Harrel Lemon Treating Kia Stavros/Extender: Jeri Cos Weeks in Treatment: 0 Non-Wound Condition: Condition: Lymphedema Location: Leg Side: Left Photos Electronic Signature(s) Signed: 10/17/2021 1:32:14 PM By: Donnamarie Poag Entered By: Donnamarie Poag on 10/17/2021 10:45:24 [name], [name] ([id number]) -------------------------------------------------------------------------------- Pain Assessment Details Patient Name: Breanna Cleverly [name]. Date of Service: 10/17/2021 10:00 AM Medical Record Number: [id number] Patient Account Number: [id number] Date of Birth/Sex: 1945-12-28 (75 y.o. F) Treating RN: Donnamarie Poag Primary Care Isadore Palecek: Harrel Lemon Other Clinician: Referring Anzel Kearse: Harrel Lemon Treating Jakalyn Kratky/Extender: Skipper Cliche in Treatment: 0 Active Problems Location of Pain Severity and Description of Pain Patient Has Paino Yes Site Locations Pain Location: Generalized Pain, Pain in Ulcers Rate the pain. Current Pain Level: 5 Pain Management and Medication Current Pain Management: Electronic Signature(s) Signed: 10/17/2021 1:32:14 PM By: Donnamarie Poag Entered By: Donnamarie Poag on 10/17/2021 10:27:38 [name], [name] ([id number]) -------------------------------------------------------------------------------- Patient/Caregiver Education Details Patient Name: Breanna Cleverly [name]. Date of Service: 10/17/2021 10:00 AM Medical Record Number: [id number]  Patient Account Number: [id number] Date of Birth/Gender: [birth year] (75 y.o. F) Treating RN: Donnamarie Poag Primary Care Physician: Harrel Lemon Other Clinician: Referring Physician: Harrel Lemon Treating Physician/Extender: Skipper Cliche in Treatment: 0 Education Assessment Education Provided To: Patient Education Topics Provided Basic Hygiene: Nutrition: Pressure: Wound/Skin Impairment: Electronic Signature(s) Signed: 10/17/2021 1:32:14 PM By: Donnamarie Poag Entered By: Donnamarie Poag on 10/17/2021 11:11:27 [name], [name] ([id number]) -------------------------------------------------------------------------------- Wound Assessment Details Patient Name: [name], Breanna Look [name]. Date of Service: 10/17/2021 10:00 AM Medical Record Number: [id number] Patient Account Number: [id number] Date of Birth/Sex: 19-Oct-1946 (75 y.o. F) Treating RN: Donnamarie Poag Primary Care Vicktoria Muckey: Harrel Lemon Other Clinician: Referring Latif Nazareno: Harrel Lemon Treating Blayze Haen/Extender: Jeri Cos Weeks in Treatment: 0 Wound Status Wound Number: 2 Primary Pressure Ulcer Etiology: Wound Location: Left Gluteus Wound Open Wounding Event: Gradually Appeared Status: Date Acquired: 09/26/2021 Comorbid Anemia, Asthma, Chronic Obstructive Pulmonary Disease Weeks Of Treatment: 0 History: (COPD), Hypertension, Osteoarthritis, Neuropathy, Clustered Wound: No Received Radiation Photos Wound Measurements Length: (cm) 0.5 Width: (cm) 0.5 Depth: (cm) 0.1 Area: (cm) 0.196 Volume: (cm) 0.02 % Reduction in Area: % Reduction in Volume: Tunneling: No Undermining: No Wound Description Classification: Category/Stage II Exudate Amount: Medium Exudate Type: Serosanguineous Exudate Color: red, brown Foul Odor After Cleansing: No Slough/Fibrino No Wound Bed Granulation Amount: Large (67-100%) Exposed Structure Granulation Quality: Red Fascia Exposed: No Necrotic Amount: None Present (0%) Fat Layer (Subcutaneous Tissue) Exposed: Yes Tendon Exposed: No Muscle Exposed: No Joint Exposed: No Bone Exposed: No Treatment Notes Wound #2 (Gluteus) Wound Laterality: Left Cleanser [place] Ancillary Kit - 15 Day Supply Discharge Instruction: Use  supplies as instructed; Kit contains: (15) Saline Bullets; (15) 3x3 Gauze; 15 pr Gloves Normal Saline Alkhatib, [name]. ([id number]) Discharge Instruction: Wash your hands with soap and water. Remove old dressing, discard into plastic bag and place into trash. Cleanse the wound with Normal Saline prior to applying a clean dressing using gauze sponges, not tissues or cotton balls. Do not scrub or use excessive force. Pat dry using gauze sponges, not tissue or cotton balls. Soap and Water Discharge Instruction: Gently cleanse wound with antibacterial soap, rinse and pat dry prior to dressing wounds Peri-Wound Care Topical Primary Dressing Xeroform-HBD 2x2 (in/in) Discharge Instruction: Apply Xeroform-HBD 2x2 (in/in) as directed Secondary Dressing Zetuvit Plus Silicone Border Dressing 4x4 (in/in) Secured With Compression Wrap Compression Stockings Add-Ons Electronic Signature(s) Signed: 10/17/2021 1:32:14 PM By: Donnamarie Poag Entered By: Donnamarie Poag on 10/17/2021 10:43:25 Casstevens, [name] -------------------------------------------------------------------------------- [place] Details Patient Name: Breanna Cleverly [name]. Date of Service: 10/17/2021 10:00 AM Medical Record Number: [id number] Patient Account Number: [id number] Date of Birth/Sex: 05-05-1946 (75 y.o. F) Treating RN: Donnamarie Poag Primary Care Dontaye Hur: Harrel Lemon Other Clinician: Referring Anuj Summons: Harrel Lemon Treating Everlena Mackley/Extender: Skipper Cliche in Treatment: 0 Vital Signs Time Taken: 09:15 Temperature (F): 98.1 Height (in): 58 Pulse (bpm): 74 Source: Stated Respiratory Rate (breaths/min): 20 Weight (lbs): 201 Blood Pressure (mmHg): 109/74 Source: Stated Reference Range: 80 - 120 mg / dl Body Mass Index (BMI): 42 Airway Inhaled Oxygen Concentration (%): 2 Notes UNABLE TO STAND ON SCALES;stated last recorded measurements Electronic Signature(s) Signed: 10/17/2021 1:32:14 PM By: Donnamarie Poag Entered ByDonnamarie Poag on 10/17/2021 10:28:37

## 2021-10-18 NOTE — Progress Notes (Signed)
[name], [name] ([id number]) Visit Report for 10/17/2021 Chief Complaint Document Details Patient Name: [name], [name]. Date of Service: 10/17/2021 10:00 AM Medical Record Number: [id number] Patient Account Number: [id number] Date of Birth/Sex: August 12, 1946 (75 y.o. F) Treating RN: Donnamarie Poag Primary Care Provider: Harrel Lemon Other Clinician: Referring Provider: Harrel Lemon Treating Provider/Extender: Jeri Cos Weeks in Treatment: 0 Information Obtained from: Patient Chief Complaint Left gluteal pressure ulcer Electronic Signature(s) Signed: 10/17/2021 10:54:51 AM By: [name] PA-C Entered By: [name] on 10/17/2021 10:54:51 [name], [name] ([id number]) -------------------------------------------------------------------------------- Debridement Details Patient Name: [name]. Date of Service: 10/17/2021 10:00 AM Medical Record Number: [id number] Patient Account Number: [id number] Date of Birth/Sex: October 12, 1946 (75 y.o. F) Treating RN: Donnamarie Poag Primary Care Provider: Harrel Lemon Other Clinician: Referring Provider: Harrel Lemon Treating Provider/Extender: Skipper Cliche in Treatment: 0 Debridement Performed for Wound #2 Left Gluteus Assessment: Performed By: Physician Tommie Sams., PA-C Debridement Type: Chemical/Enzymatic/Mechanical Agent Used: Gauze and saline Level of Consciousness (Pre- Awake and Alert procedure): Pre-procedure Verification/Time Out Yes - 11:05 Taken: Start Time: 11:06 Pain Control: Lidocaine Instrument: Other : saline gauze Bleeding: Minimum Hemostasis Achieved: Pressure Response to Treatment: Procedure was tolerated well Level of Consciousness (Post- Awake and Alert procedure): Post Debridement Measurements of Total Wound Length: (cm) 0.5 Stage: Category/Stage II Width: (cm) 0.5 Depth: (cm) 0.1 Volume: (cm) 0.02 Character of Wound/Ulcer Post Debridement: Improved Post Procedure Diagnosis Same as  Pre-procedure Electronic Signature(s) Signed: 10/17/2021 1:32:14 PM By: Donnamarie Poag Signed: 10/17/2021 5:42:10 PM By: [name] PA-C Entered By: Donnamarie Poag on 10/17/2021 11:08:41 [name], [name] ([id number]) -------------------------------------------------------------------------------- HPI Details Patient Name: Breanna Cleverly H. Date of Service: 10/17/2021 10:00 AM Medical Record Number: [id number] Patient Account Number: [id number] Date of Birth/Sex: 1946/02/10 (75 y.o. F) Treating RN: Donnamarie Poag Primary Care Provider: Harrel Lemon Other Clinician: Referring Provider: Harrel Lemon Treating Provider/Extender: Skipper Cliche in Treatment: 0 History of Present Illness HPI Description: 06/20/2019 on evaluation today patient presents for initial inspection here in our office concerning issue that has been going on for the past several weeks with her moisture associated breakdown in the right lower quadrant of the abdominal region underneath her pannus. Subsequently she is not having any pain which is at least good news. Her primary care provider did give her a prescription for nystatin powder as well as doxycycline this ends on the 28th as far as doxycycline is concerned that is just 1 week away. Nonetheless I do not see any evidence of active infection at this time which is good news. With that being said the wound itself does appear to be in a very precarious location right at the crease which will make it more difficult to heal secondary to moisture issues. Nonetheless I think that potentially an alginate dressing could be of benefit here. No fevers, chills, nausea, vomiting, or diarrhea. She does have a history of COPD, hypertension, obesity, and more recently in this area a tinea corporis infection. 06/27/2019 on evaluation today patient actually appears to be doing I believe okay with regard to her wound. There is much less irritation today compared to the last visit I feel  like the nystatin powder has helped in that regard also feel like she is much less macerated than previously noted. With that being said there is a slight hint of new skin growing around the edges of the wound which is good news obviously this is just can take some time. 07/04/19 on evaluation today patient  appears to be doing well with regard to her right lower quadrant abdominal ulcer. She is making some progress albeit very slow. There does not appear to be any fungal/yeast infection at this time which is good news. With that being said there does appear to be slow epithelialization around the edge of the wound. The wound bed itself is not quite as healthy as I would like to see though no slough there just really is not a lot of good granulation tissue there is a lot of what appears to be scar tissue which obviously is much harder to get new skin to grow over. I think she may benefit from switching to [facility] dressing to see if this could be helpful in speeding up the healing process. 07/11/2019 on evaluation today patient actually appears to be making excellent progress in my opinion with regard to her abdominal wall ulcer. She has been tolerating the dressing changes without complication. Fortunately there is no signs of active infection at this time. No fevers, chills, nausea, vomiting, or diarrhea. 07/21/2019 on evaluation today patient actually appears to be doing quite well with regard to her abdominal ulcer. In fact I did have a close look at this today and it appears to be completely healed. Fortunately there is no signs of active infection at this time and overall she has been doing quite well. The patient is very glad to hear this and really has not had any significant drainage at this time. Readmission: 10/17/2021 upon evaluation today patient presents for readmission here in the clinic concerning issues that she has been having currently with a wound in the left gluteal region.  This appears to be fairly superficial and in fact there is some scar tissue here but nonetheless this just appears to be a crease where it is cracked at the base. Honestly I think this is something that could heal quite readily. I have actually seen her previously and the last time was July 21, 2019 at that time we were treating her legs and issues she was having lymphedema and swelling in that regard. Nonetheless I think at this point that the patient is actually doing well in regard to her legs especially considering she does not have any compression on really it would be great if she could have compression in place that would be ideal. I discussed that with her today she states that her daughter-in-law could potentially help her with that that would be good. Electronic Signature(s) Signed: 10/17/2021 5:38:31 PM By: [name] PA-C Entered By: [name] on 10/17/2021 17:38:31 [name], [name] ([id number]) -------------------------------------------------------------------------------- Physical Exam Details Patient Name: Breanna Cleverly H. Date of Service: 10/17/2021 10:00 AM Medical Record Number: [id number] Patient Account Number: [id number] Date of Birth/Sex: 11-08-1945 (75 y.o. F) Treating RN: Donnamarie Poag Primary Care Provider: Harrel Lemon Other Clinician: Referring Provider: Harrel Lemon Treating Provider/Extender: Jeri Cos Weeks in Treatment: 0 Constitutional sitting or standing blood pressure is within target range for patient.. pulse regular and within target range for patient.Marland Kitchen respirations regular, non- labored and within target range for patient.Marland Kitchen temperature within target range for patient.. Well-nourished and well-hydrated in no acute distress. Eyes conjunctiva clear no eyelid edema noted. pupils equal round and reactive to light and accommodation. Ears, Nose, Mouth, and Throat no gross abnormality of ear auricles or external auditory canals. normal hearing  noted during conversation. mucus membranes moist. Respiratory normal breathing without difficulty. Cardiovascular 2+ dorsalis pedis/posterior tibialis pulses. 1+ pitting edema of the bilateral lower  extremities. Musculoskeletal Patient unable to walk without assistance. no significant deformity or arthritic changes, no loss or range of motion, no clubbing. Psychiatric this patient is able to make decisions and demonstrates good insight into disease process. Alert and Oriented x 3. pleasant and cooperative. Notes Upon inspection patient's wound again showed signs at this point being fairly superficial I do not see any signs of active infection at this time which is great news and overall very pleased in that regard. I do not think that this is going to take too long to get healed and the biggest thing is just to be making sure that nothing really dries out too much allowing the new skin to grow but we also do not want her to let this be too moist either it is a fine balance. Electronic Signature(s) Signed: 10/17/2021 5:39:08 PM By: [name] PA-C Entered By: [name] on 10/17/2021 17:39:08 Rash, [name] ([id number]) -------------------------------------------------------------------------------- Physician Orders Details Patient Name: [name]. Date of Service: 10/17/2021 10:00 AM Medical Record Number: [id number] Patient Account Number: [id number] Date of Birth/Sex: 06-Nov-1945 (75 y.o. F) Treating RN: Donnamarie Poag Primary Care Provider: Harrel Lemon Other Clinician: Referring Provider: Harrel Lemon Treating Provider/Extender: Skipper Cliche in Treatment: 0 Verbal / Phone Orders: No Diagnosis Coding ICD-10 Coding Code Description L89.302 Pressure ulcer of unspecified buttock, stage 2 I10 Essential (primary) hypertension E66.01 Morbid (severe) obesity due to excess calories J44.9 Chronic obstructive pulmonary disease, unspecified Follow-up Appointments o  Return Appointment in 2 weeks. Anesthetic (Use 'Patient Medications' Section for Anesthetic Order Entry) o Lidocaine applied to wound bed Edema Control - Lymphedema / Segmental Compressive Device / Other o Tubigrip single layer applied. - Size D applied until use own compression o Patient to wear own compression stockings. Remove compression stockings every night before going to bed and put on every morning when getting up. o Elevate legs to the level of the heart and pump ankles as often as possible o Elevate leg(s) parallel to the floor when sitting. o DO YOUR BEST to sleep in the bed at night. DO NOT sleep in your recliner. Long hours of sitting in a recliner leads to swelling of the legs and/or potential wounds on your backside. Wound Treatment Wound #2 - Gluteus Wound Laterality: Left Cleanser: [employer] Ancillary Kit - 15 Day Supply (DME) (Generic) Every Other Day/15 Days Discharge Instructions: Use supplies as instructed; Kit contains: (15) Saline Bullets; (15) 3x3 Gauze; 15 pr Gloves Cleanser: Normal Saline Every Other Day/15 Days Discharge Instructions: Wash your hands with soap and water. Remove old dressing, discard into plastic bag and place into trash. Cleanse the wound with Normal Saline prior to applying a clean dressing using gauze sponges, not tissues or cotton balls. Do not scrub or use excessive force. Pat dry using gauze sponges, not tissue or cotton balls. Cleanser: Soap and Water Every Other Day/15 Days Discharge Instructions: Gently cleanse wound with antibacterial soap, rinse and pat dry prior to dressing wounds Primary Dressing: Xeroform-HBD 2x2 (in/in) Every Other Day/15 Days Discharge Instructions: Apply Xeroform-HBD 2x2 (in/in) as directed Secondary Dressing: Zetuvit Plus Silicone Border Dressing 4x4 (in/in) (DME) (Generic) Every Other Day/15 Days Electronic Signature(s) Signed: 10/17/2021 1:32:14 PM By: Donnamarie Poag Signed: 10/17/2021 5:42:10 PM By: [name] PA-C Entered By: Donnamarie Poag on 10/17/2021 11:09:16 [name], [name] ([id number]) -------------------------------------------------------------------------------- Problem List Details Patient Name: Breanna Cleverly H. Date of Service: 10/17/2021 10:00 AM Medical Record Number: [id number] Patient Account Number: [id number] Date of Birth/Sex:  05-03-1946 (75 y.o. F) Treating RN: Donnamarie Poag Primary Care Provider: Harrel Lemon Other Clinician: Referring Provider: Harrel Lemon Treating Provider/Extender: Jeri Cos Weeks in Treatment: 0 Active Problems ICD-10 Encounter Code Description Active Date MDM Diagnosis L89.302 Pressure ulcer of unspecified buttock, stage 2 10/17/2021 No Yes I10 Essential (primary) hypertension 10/17/2021 No Yes E66.01 Morbid (severe) obesity due to excess calories 10/17/2021 No Yes J44.9 Chronic obstructive pulmonary disease, unspecified 10/17/2021 No Yes Inactive Problems Resolved Problems Electronic Signature(s) Signed: 10/17/2021 10:54:23 AM By: [name] PA-C Entered By: [name] on 10/17/2021 10:54:23 [name], [name] ([id number]) -------------------------------------------------------------------------------- Progress Note Details Patient Name: [name]. Date of Service: 10/17/2021 10:00 AM Medical Record Number: [id number] Patient Account Number: [id number] Date of Birth/Sex: 08/15/1946 (75 y.o. F) Treating RN: Donnamarie Poag Primary Care Provider: Harrel Lemon Other Clinician: Referring Provider: Harrel Lemon Treating Provider/Extender: Skipper Cliche in Treatment: 0 Subjective Chief Complaint Information obtained from Patient Left gluteal pressure ulcer History of Present Illness (HPI) 06/20/2019 on evaluation today patient presents for initial inspection here in our office concerning issue that has been going on for the past several weeks with her moisture associated breakdown in the right lower quadrant of the  abdominal region underneath her pannus. Subsequently she is not having any pain which is at least good news. Her primary care provider did give her a prescription for nystatin powder as well as doxycycline this ends on the 28th as far as doxycycline is concerned that is just 1 week away. Nonetheless I do not see any evidence of active infection at this time which is good news. With that being said the wound itself does appear to be in a very precarious location right at the crease which will make it more difficult to heal secondary to moisture issues. Nonetheless I think that potentially an alginate dressing could be of benefit here. No fevers, chills, nausea, vomiting, or diarrhea. She does have a history of COPD, hypertension, obesity, and more recently in this area a tinea corporis infection. 06/27/2019 on evaluation today patient actually appears to be doing I believe okay with regard to her wound. There is much less irritation today compared to the last visit I feel like the nystatin powder has helped in that regard also feel like she is much less macerated than previously noted. With that being said there is a slight hint of new skin growing around the edges of the wound which is good news obviously this is just can take some time. 07/04/19 on evaluation today patient appears to be doing well with regard to her right lower quadrant abdominal ulcer. She is making some progress albeit very slow. There does not appear to be any fungal/yeast infection at this time which is good news. With that being said there does appear to be slow epithelialization around the edge of the wound. The wound bed itself is not quite as healthy as I would like to see though no slough there just really is not a lot of good granulation tissue there is a lot of what appears to be scar tissue which obviously is much harder to get new skin to grow over. I think she may benefit from switching to [facility] dressing to see if  this could be helpful in speeding up the healing process. 07/11/2019 on evaluation today patient actually appears to be making excellent progress in my opinion with regard to her abdominal wall ulcer. She has been tolerating the dressing changes without complication. Fortunately there  is no signs of active infection at this time. No fevers, chills, nausea, vomiting, or diarrhea. 07/21/2019 on evaluation today patient actually appears to be doing quite well with regard to her abdominal ulcer. In fact I did have a close look at this today and it appears to be completely healed. Fortunately there is no signs of active infection at this time and overall she has been doing quite well. The patient is very glad to hear this and really has not had any significant drainage at this time. Readmission: 10/17/2021 upon evaluation today patient presents for readmission here in the clinic concerning issues that she has been having currently with a wound in the left gluteal region. This appears to be fairly superficial and in fact there is some scar tissue here but nonetheless this just appears to be a crease where it is cracked at the base. Honestly I think this is something that could heal quite readily. I have actually seen her previously and the last time was July 21, 2019 at that time we were treating her legs and issues she was having lymphedema and swelling in that regard. Nonetheless I think at this point that the patient is actually doing well in regard to her legs especially considering she does not have any compression on really it would be great if she could have compression in place that would be ideal. I discussed that with her today she states that her daughter-in-law could potentially help her with that that would be good. Patient History Information obtained from Patient. Allergies Levaquin (Severity: Moderate, Reaction: unsure she stated) Family History Cancer - Child, Diabetes - Mother,  Heart Disease - Child, Hypertension - Child, Thyroid Problems - Child, No family history of Hereditary Spherocytosis, Kidney Disease, Lung Disease, Seizures, Stroke, Tuberculosis. Social History Former smoker - 40 years - ended on 05/30/2016, Marital Status - Married, Alcohol Use - Never, Drug Use - No History, Caffeine Use - Daily - soda. Medical History Eyes Denies history of Cataracts, Glaucoma, Optic Neuritis Hematologic/Lymphatic Patient has history of Anemia Denies history of Hemophilia, Human Immunodeficiency Virus, Lymphedema, Sickle Cell Disease [name], [name] ([id number]) Respiratory Patient has history of Asthma, Chronic Obstructive Pulmonary Disease (COPD) Denies history of Pneumothorax, Sleep Apnea, Tuberculosis Cardiovascular Patient has history of Hypertension Denies history of Angina, Arrhythmia, Congestive Heart Failure, Coronary Artery Disease, Deep Vein Thrombosis, Hypotension, Myocardial Infarction, Peripheral Arterial Disease, Peripheral Venous Disease, Phlebitis, Vasculitis Gastrointestinal Denies history of Cirrhosis , Colitis, Crohn s, Hepatitis A, Hepatitis B, Hepatitis C Endocrine Denies history of Type I Diabetes, Type II Diabetes Genitourinary Denies history of End Stage Renal Disease Immunological Denies history of Lupus Erythematosus, Raynaud s, Scleroderma Integumentary (Skin) Denies history of History of Burn, History of pressure wounds Musculoskeletal Patient has history of Osteoarthritis Denies history of Gout, Rheumatoid Arthritis, Osteomyelitis Neurologic Patient has history of Neuropathy Denies history of Dementia, Quadriplegia, Paraplegia, Seizure Disorder Oncologic Patient has history of Received Radiation - lungs Psychiatric Denies history of Anorexia/bulimia, Confinement Anxiety Review of Systems (ROS) Eyes Complains or has symptoms of Glasses / Contacts. Ear/Nose/Mouth/Throat Denies complaints or symptoms of Difficult clearing ears,  Sinusitis. Respiratory Complains or has symptoms of Shortness of Breath. Cardiovascular Complains or has symptoms of LE edema. Gastrointestinal Denies complaints or symptoms of Frequent diarrhea, Nausea, Vomiting. Endocrine Complains or has symptoms of Thyroid disease. Genitourinary Complains or has symptoms of Incontinence/dribbling. Immunological Denies complaints or symptoms of Hives, Itching. Integumentary (Skin) Complains or has symptoms of Wounds - hx LE, Breakdown, Swelling. Psychiatric  Complains or has symptoms of Anxiety. Objective Constitutional sitting or standing blood pressure is within target range for patient.. pulse regular and within target range for patient.Marland Kitchen respirations regular, non- labored and within target range for patient.Marland Kitchen temperature within target range for patient.. Well-nourished and well-hydrated in no acute distress. Vitals Time Taken: 9:15 AM, Height: 58 in, Source: Stated, Weight: 201 lbs, Source: Stated, BMI: 42, Temperature: 98.1 F, Pulse: 74 bpm, Respiratory Rate: 20 breaths/min, Blood Pressure: 109/74 mmHg. General Notes: UNABLE TO STAND ON SCALES;stated last recorded measurements Eyes conjunctiva clear no eyelid edema noted. pupils equal round and reactive to light and accommodation. Ears, Nose, Mouth, and Throat no gross abnormality of ear auricles or external auditory canals. normal hearing noted during conversation. mucus membranes moist. Respiratory normal breathing without difficulty. Finger, Chai H. ([id number]) Cardiovascular 2+ dorsalis pedis/posterior tibialis pulses. 1+ pitting edema of the bilateral lower extremities. Musculoskeletal Patient unable to walk without assistance. no significant deformity or arthritic changes, no loss or range of motion, no clubbing. Psychiatric this patient is able to make decisions and demonstrates good insight into disease process. Alert and Oriented x 3. pleasant and cooperative. General Notes:  Upon inspection patient's wound again showed signs at this point being fairly superficial I do not see any signs of active infection at this time which is great news and overall very pleased in that regard. I do not think that this is going to take too long to get healed and the biggest thing is just to be making sure that nothing really dries out too much allowing the new skin to grow but we also do not want her to let this be too moist either it is a fine balance. Integumentary (Hair, Skin) Wound #2 status is Open. Original cause of wound was Gradually Appeared. The date acquired was: 09/26/2021. The wound is located on the Left Gluteus. The wound measures 0.5cm length x 0.5cm width x 0.1cm depth; 0.196cm^2 area and 0.02cm^3 volume. There is Fat Layer (Subcutaneous Tissue) exposed. There is no tunneling or undermining noted. There is a medium amount of serosanguineous drainage noted. There is large (67-100%) red granulation within the wound bed. There is no necrotic tissue within the wound bed. Other Condition(s) Patient presents with Lymphedema located on the Left Leg. Assessment Active Problems ICD-10 Pressure ulcer of unspecified buttock, stage 2 Essential (primary) hypertension Morbid (severe) obesity due to excess calories Chronic obstructive pulmonary disease, unspecified Procedures Wound #2 Pre-procedure diagnosis of Wound #2 is a Pressure Ulcer located on the Left Gluteus . There was a Chemical/Enzymatic/Mechanical debridement performed by Tommie Sams., PA-C. With the following instrument(s): saline gauze after achieving pain control using Lidocaine. Other agent used was Gauze and saline. A time out was conducted at 11:05, prior to the start of the procedure. A Minimum amount of bleeding was controlled with Pressure. The procedure was tolerated well. Post Debridement Measurements: 0.5cm length x 0.5cm width x 0.1cm depth; 0.02cm^3 volume. Post debridement Stage noted as  Category/Stage II. Character of Wound/Ulcer Post Debridement is improved. Post procedure Diagnosis Wound #2: Same as Pre-Procedure Plan Follow-up Appointments: Return Appointment in 2 weeks. Anesthetic (Use 'Patient Medications' Section for Anesthetic Order Entry): Lidocaine applied to wound bed Edema Control - Lymphedema / Segmental Compressive Device / Other: Tubigrip single layer applied. - Size D applied until use own compression Patient to wear own compression stockings. Remove compression stockings every night before going to bed and put on every morning when getting up. Elevate legs to the level  of the heart and pump ankles as often as possible Elevate leg(s) parallel to the floor when sitting. DO YOUR BEST to sleep in the bed at night. DO NOT sleep in your recliner. Long hours of sitting in a recliner leads to swelling of the legs and/or potential wounds on your backside. WOUND #2: - Gluteus Wound Laterality: Left Cleanser: [place] Ancillary Kit - 15 Day Supply (DME) (Generic) Every Other Day/15 Days Discharge Instructions: Use supplies as instructed; Kit contains: (15) Saline Bullets; (15) 3x3 Gauze; 15 pr Gloves [name], [name] ([id number]) Cleanser: Normal Saline Every Other Day/15 Days Discharge Instructions: Wash your hands with soap and water. Remove old dressing, discard into plastic bag and place into trash. Cleanse the wound with Normal Saline prior to applying a clean dressing using gauze sponges, not tissues or cotton balls. Do not scrub or use excessive force. Pat dry using gauze sponges, not tissue or cotton balls. Cleanser: Soap and Water Every Other Day/15 Days Discharge Instructions: Gently cleanse wound with antibacterial soap, rinse and pat dry prior to dressing wounds Primary Dressing: Xeroform-HBD 2x2 (in/in) Every Other Day/15 Days Discharge Instructions: Apply Xeroform-HBD 2x2 (in/in) as directed Secondary Dressing: Zetuvit Plus Silicone Border Dressing 4x4  (in/in) (DME) (Generic) Every Other Day/15 Days 1. Based on what I am seeing currently I Georgina Peer go ahead and recommend that we use Xeroform over the wound which I think will be good. 2. Also can recommend that we use a Zetuvit border foam dressing for protection. 3. With regard to her leg we did put Tubigrip on today though I think compression socks would be better and she does have them she does need somebody to help her get them on. We will see patient back for reevaluation in 2 weeks here in the clinic. If anything worsens or changes patient will contact our office for additional recommendations. Electronic Signature(s) Signed: 10/17/2021 5:41:27 PM By: [name] PA-C Entered By: [name] on 10/17/2021 17:41:27 [name], [name] ([id number]) -------------------------------------------------------------------------------- ROS/PFSH Details Patient Name: [name]. Date of Service: 10/17/2021 10:00 AM Medical Record Number: [id number] Patient Account Number: [id number] Date of Birth/Sex: 22-Mar-1946 (75 y.o. F) Treating RN: Donnamarie Poag Primary Care Provider: Harrel Lemon Other Clinician: Referring Provider: Harrel Lemon Treating Provider/Extender: Skipper Cliche in Treatment: 0 Information Obtained From Patient Eyes Complaints and Symptoms: Positive for: Glasses / Contacts Medical History: Negative for: Cataracts; Glaucoma; Optic Neuritis Ear/Nose/Mouth/Throat Complaints and Symptoms: Negative for: Difficult clearing ears; Sinusitis Respiratory Complaints and Symptoms: Positive for: Shortness of Breath Medical History: Positive for: Asthma; Chronic Obstructive Pulmonary Disease (COPD) Negative for: Pneumothorax; Sleep Apnea; Tuberculosis Cardiovascular Complaints and Symptoms: Positive for: LE edema Medical History: Positive for: Hypertension Negative for: Angina; Arrhythmia; Congestive Heart Failure; Coronary Artery Disease; Deep Vein Thrombosis;  Hypotension; Myocardial Infarction; Peripheral Arterial Disease; Peripheral Venous Disease; Phlebitis; Vasculitis Gastrointestinal Complaints and Symptoms: Negative for: Frequent diarrhea; Nausea; Vomiting Medical History: Negative for: Cirrhosis ; Colitis; Crohnos; Hepatitis A; Hepatitis B; Hepatitis C Endocrine Complaints and Symptoms: Positive for: Thyroid disease Medical History: Negative for: Type I Diabetes; Type II Diabetes Genitourinary Complaints and Symptoms: Positive for: Incontinence/dribbling Medical History: Negative for: End Stage Renal Disease Immunological [name], [name] ([id number]) Complaints and Symptoms: Negative for: Hives; Itching Medical History: Negative for: Lupus Erythematosus; Raynaudos; Scleroderma Integumentary (Skin) Complaints and Symptoms: Positive for: Wounds - hx LE; Breakdown; Swelling Medical History: Negative for: History of Burn; History of pressure wounds Psychiatric Complaints and Symptoms: Positive for: Anxiety Medical History: Negative for:  Anorexia/bulimia; Confinement Anxiety Hematologic/Lymphatic Medical History: Positive for: Anemia Negative for: Hemophilia; Human Immunodeficiency Virus; Lymphedema; Sickle Cell Disease Musculoskeletal Medical History: Positive for: Osteoarthritis Negative for: Gout; Rheumatoid Arthritis; Osteomyelitis Neurologic Medical History: Positive for: Neuropathy Negative for: Dementia; Quadriplegia; Paraplegia; Seizure Disorder Oncologic Medical History: Positive for: Received Radiation - lungs Immunizations Pneumococcal Vaccine: Received Pneumococcal Vaccination: No Tetanus Vaccine: Last tetanus shot: 02/27/2018 Implantable Devices None Family and Social History Cancer: Yes - Child; Diabetes: Yes - Mother; Heart Disease: Yes - Child; Hereditary Spherocytosis: No; Hypertension: Yes - Child; Kidney Disease: No; Lung Disease: No; Seizures: No; Stroke: No; Thyroid Problems: Yes - Child;  Tuberculosis: No; Former smoker - 40 years - ended on 05/30/2016; Marital Status - Married; Alcohol Use: Never; Drug Use: No History; Caffeine Use: Daily - soda; Financial Concerns: No; Food, Clothing or Shelter Needs: No; Support System Lacking: No; Transportation Concerns: No Electronic Signature(s) Signed: 10/17/2021 1:32:14 PM By: Donnamarie Poag Signed: 10/17/2021 5:42:10 PM By: [name] PA-C Entered By: Donnamarie Poag on 10/17/2021 10:31:44 Demas, [name] ([id number]) -------------------------------------------------------------------------------- SuperBill Details Patient Name: [name]. Date of Service: 10/17/2021 Medical Record Number: [id number] Patient Account Number: [id number] Date of Birth/Sex: [birth year] (75 y.o. F) Treating RN: Donnamarie Poag Primary Care Provider: Harrel Lemon Other Clinician: Referring Provider: Harrel Lemon Treating Provider/Extender: Skipper Cliche in Treatment: 0 Diagnosis Coding ICD-10 Codes Code Description L89.302 Pressure ulcer of unspecified buttock, stage 2 I10 Essential (primary) hypertension E66.01 Morbid (severe) obesity due to excess calories J44.9 Chronic obstructive pulmonary disease, unspecified Facility Procedures CPT4 Code: [id number] Description: 99213 - WOUND CARE VISIT-LEV 3 EST PT Modifier: Quantity: 1 Physician Procedures CPT4 Code: 3875643 Description: 32951 - WC PHYS LEVEL 4 - EST PT Modifier: Quantity: 1 CPT4 Code: Description: ICD-10 Diagnosis Description L89.302 Pressure ulcer of unspecified buttock, stage 2 I10 Essential (primary) hypertension E66.01 Morbid (severe) obesity due to excess calories J44.9 Chronic obstructive pulmonary disease, unspecified Modifier: Quantity: Electronic Signature(s) Signed: 10/17/2021 5:41:53 PM By: [name] PA-C Previous Signature: 10/17/2021 1:32:14 PM Version By: Donnamarie Poag Entered By: [name] on 10/17/2021 17:41:53

## 2021-10-26 ENCOUNTER — Emergency Department: Payer: Medicare Other

## 2021-10-26 ENCOUNTER — Observation Stay: Payer: Medicare Other

## 2021-10-26 ENCOUNTER — Other Ambulatory Visit: Payer: Self-pay

## 2021-10-26 ENCOUNTER — Inpatient Hospital Stay
Admission: EM | Admit: 2021-10-26 | Discharge: 2021-10-29 | DRG: 190 | Disposition: A | Discharge status: Home / Self Care | Payer: Medicare Other | Attending: Internal Medicine | Admitting: Internal Medicine

## 2021-10-26 DIAGNOSIS — I471 Supraventricular tachycardia: Secondary | ICD-10-CM | POA: Diagnosis present

## 2021-10-26 DIAGNOSIS — Z90710 Acquired absence of both cervix and uterus: Secondary | ICD-10-CM

## 2021-10-26 DIAGNOSIS — E119 Type 2 diabetes mellitus without complications: Secondary | ICD-10-CM | POA: Diagnosis present

## 2021-10-26 DIAGNOSIS — Z79899 Other long term (current) drug therapy: Secondary | ICD-10-CM

## 2021-10-26 DIAGNOSIS — J441 Chronic obstructive pulmonary disease with (acute) exacerbation: Principal | ICD-10-CM | POA: Diagnosis present

## 2021-10-26 DIAGNOSIS — R0602 Shortness of breath: Secondary | ICD-10-CM

## 2021-10-26 DIAGNOSIS — F419 Anxiety disorder, unspecified: Secondary | ICD-10-CM | POA: Diagnosis present

## 2021-10-26 DIAGNOSIS — B370 Candidal stomatitis: Secondary | ICD-10-CM | POA: Diagnosis present

## 2021-10-26 DIAGNOSIS — Z87891 Personal history of nicotine dependence: Secondary | ICD-10-CM

## 2021-10-26 DIAGNOSIS — I10 Essential (primary) hypertension: Secondary | ICD-10-CM | POA: Diagnosis present

## 2021-10-26 DIAGNOSIS — J9602 Acute respiratory failure with hypercapnia: Secondary | ICD-10-CM

## 2021-10-26 DIAGNOSIS — J383 Other diseases of vocal cords: Secondary | ICD-10-CM | POA: Diagnosis present

## 2021-10-26 DIAGNOSIS — A419 Sepsis, unspecified organism: Secondary | ICD-10-CM | POA: Diagnosis not present

## 2021-10-26 DIAGNOSIS — M533 Sacrococcygeal disorders, not elsewhere classified: Secondary | ICD-10-CM | POA: Diagnosis present

## 2021-10-26 DIAGNOSIS — Z8249 Family history of ischemic heart disease and other diseases of the circulatory system: Secondary | ICD-10-CM

## 2021-10-26 DIAGNOSIS — J9611 Chronic respiratory failure with hypoxia: Secondary | ICD-10-CM | POA: Diagnosis not present

## 2021-10-26 DIAGNOSIS — Z79890 Hormone replacement therapy (postmenopausal): Secondary | ICD-10-CM

## 2021-10-26 DIAGNOSIS — J189 Pneumonia, unspecified organism: Secondary | ICD-10-CM

## 2021-10-26 DIAGNOSIS — R1310 Dysphagia, unspecified: Secondary | ICD-10-CM | POA: Diagnosis present

## 2021-10-26 DIAGNOSIS — J9601 Acute respiratory failure with hypoxia: Principal | ICD-10-CM

## 2021-10-26 DIAGNOSIS — Z7952 Long term (current) use of systemic steroids: Secondary | ICD-10-CM

## 2021-10-26 DIAGNOSIS — J9621 Acute and chronic respiratory failure with hypoxia: Secondary | ICD-10-CM | POA: Diagnosis present

## 2021-10-26 DIAGNOSIS — Z923 Personal history of irradiation: Secondary | ICD-10-CM

## 2021-10-26 DIAGNOSIS — F32A Depression, unspecified: Secondary | ICD-10-CM | POA: Diagnosis present

## 2021-10-26 DIAGNOSIS — Z20822 Contact with and (suspected) exposure to covid-19: Secondary | ICD-10-CM | POA: Diagnosis present

## 2021-10-26 DIAGNOSIS — Z9114 Patient's other noncompliance with medication regimen: Secondary | ICD-10-CM

## 2021-10-26 DIAGNOSIS — C3412 Malignant neoplasm of upper lobe, left bronchus or lung: Secondary | ICD-10-CM | POA: Diagnosis present

## 2021-10-26 DIAGNOSIS — E079 Disorder of thyroid, unspecified: Secondary | ICD-10-CM | POA: Diagnosis present

## 2021-10-26 DIAGNOSIS — J9622 Acute and chronic respiratory failure with hypercapnia: Secondary | ICD-10-CM | POA: Diagnosis present

## 2021-10-26 DIAGNOSIS — M5030 Other cervical disc degeneration, unspecified cervical region: Secondary | ICD-10-CM | POA: Diagnosis present

## 2021-10-26 DIAGNOSIS — K219 Gastro-esophageal reflux disease without esophagitis: Secondary | ICD-10-CM | POA: Diagnosis present

## 2021-10-26 DIAGNOSIS — G2581 Restless legs syndrome: Secondary | ICD-10-CM | POA: Diagnosis present

## 2021-10-26 DIAGNOSIS — Z833 Family history of diabetes mellitus: Secondary | ICD-10-CM

## 2021-10-26 DIAGNOSIS — E039 Hypothyroidism, unspecified: Secondary | ICD-10-CM | POA: Diagnosis present

## 2021-10-26 LAB — BLOOD GAS, VENOUS
Acid-Base Excess: 11.9 mmol/L — ABNORMAL HIGH (ref 0.0–2.0)
Bicarbonate: 40.5 mmol/L — ABNORMAL HIGH (ref 20.0–28.0)
O2 Saturation: 68.3 %
Patient temperature: 37
pCO2, Ven: 75 mmHg (ref 44.0–60.0)
pH, Ven: 7.34 (ref 7.250–7.430)
pO2, Ven: 38 mmHg (ref 32.0–45.0)

## 2021-10-26 LAB — CBC
HCT: 36.8 % (ref 36.0–46.0)
Hemoglobin: 11.7 g/dL — ABNORMAL LOW (ref 12.0–15.0)
MCH: 29.6 pg (ref 26.0–34.0)
MCHC: 31.8 g/dL (ref 30.0–36.0)
MCV: 93.2 fL (ref 80.0–100.0)
Platelets: 243 10*3/uL (ref 150–400)
RBC: 3.95 MIL/uL (ref 3.87–5.11)
RDW: 14.2 % (ref 11.5–15.5)
WBC: 14.4 10*3/uL — ABNORMAL HIGH (ref 4.0–10.5)
nRBC: 0 % (ref 0.0–0.2)

## 2021-10-26 LAB — BASIC METABOLIC PANEL
Anion gap: 10 (ref 5–15)
BUN: 13 mg/dL (ref 8–23)
CO2: 33 mmol/L — ABNORMAL HIGH (ref 22–32)
Calcium: 9.7 mg/dL (ref 8.9–10.3)
Chloride: 93 mmol/L — ABNORMAL LOW (ref 98–111)
Creatinine, Ser: 0.55 mg/dL (ref 0.44–1.00)
GFR, Estimated: >60 mL/min (ref >60)
Glucose, Bld: 127 mg/dL — ABNORMAL HIGH (ref 70–99)
Potassium: 3.9 mmol/L (ref 3.5–5.1)
Sodium: 136 mmol/L (ref 135–145)

## 2021-10-26 LAB — RESP PANEL BY RT-PCR (FLU A&B, COVID) ARPGX2
Influenza A by PCR: NEGATIVE
Influenza B by PCR: NEGATIVE
SARS Coronavirus 2 by RT PCR: NEGATIVE

## 2021-10-26 LAB — PROCALCITONIN: Procalcitonin: 0.25 ng/mL

## 2021-10-26 LAB — TROPONIN I (HIGH SENSITIVITY)
Troponin I (High Sensitivity): 8 ng/L (ref <18)
Troponin I (High Sensitivity): 8 ng/L (ref <18)

## 2021-10-26 LAB — LACTIC ACID, PLASMA: Lactic Acid, Venous: 2.5 mmol/L (ref 0.5–1.9)

## 2021-10-26 MED ORDER — LABETALOL HCL 5 MG/ML IV SOLN
INTRAVENOUS | Status: AC
  Filled 2021-10-26: qty 4

## 2021-10-26 MED ORDER — SODIUM CHLORIDE 0.9 % IV SOLN: 500.0000 mg | INTRAVENOUS | Status: DC

## 2021-10-26 MED ORDER — METOPROLOL TARTRATE 5 MG/5ML IV SOLN
INTRAVENOUS | Status: AC
  Filled 2021-10-26: qty 5

## 2021-10-26 MED ORDER — TIOTROPIUM BROMIDE MONOHYDRATE 18 MCG IN CAPS: 18.0000 ug | ORAL_CAPSULE | Freq: Every day | RESPIRATORY_TRACT | Status: DC

## 2021-10-26 MED ORDER — NYSTATIN 100000 UNIT/ML MT SUSP
5.0000 mL | Freq: Four times a day (QID) | OROMUCOSAL | Status: DC
  Administered 2021-10-27 – 2021-10-29 (×8): 500000 [IU] via ORAL
  Filled 2021-10-26 (×12): qty 5

## 2021-10-26 MED ORDER — METHYLPREDNISOLONE SODIUM SUCC 125 MG IJ SOLR
80.0000 mg | Freq: Every day | INTRAMUSCULAR | Status: AC
  Administered 2021-10-27 – 2021-10-28 (×2): 80 mg via INTRAVENOUS
  Filled 2021-10-26 (×2): qty 2

## 2021-10-26 MED ORDER — SODIUM CHLORIDE 0.9 % IV SOLN
2.0000 g | INTRAVENOUS | Status: DC
  Administered 2021-10-27 – 2021-10-28 (×2): 2 g via INTRAVENOUS
  Filled 2021-10-26: qty 20
  Filled 2021-10-26 (×2): qty 2

## 2021-10-26 MED ORDER — BISACODYL 5 MG PO TBEC
5.0000 mg | DELAYED_RELEASE_TABLET | Freq: Every day | ORAL | Status: DC | PRN
  Administered 2021-10-27 – 2021-10-28 (×2): 5 mg via ORAL
  Filled 2021-10-26 (×2): qty 1

## 2021-10-26 MED ORDER — ONDANSETRON HCL 4 MG PO TABS: 4.0000 mg | ORAL_TABLET | Freq: Four times a day (QID) | ORAL | Status: DC | PRN

## 2021-10-26 MED ORDER — SODIUM CHLORIDE 0.9 % IV BOLUS
1000.0000 mL | Freq: Once | INTRAVENOUS | Status: AC
  Administered 2021-10-26: 23:00:00 1000 mL via INTRAVENOUS

## 2021-10-26 MED ORDER — ENOXAPARIN SODIUM 60 MG/0.6ML IJ SOSY
0.5000 mg/kg | PREFILLED_SYRINGE | Freq: Every day | INTRAMUSCULAR | Status: DC
  Administered 2021-10-27 – 2021-10-28 (×2): 45 mg via SUBCUTANEOUS
  Filled 2021-10-26 (×2): qty 0.6

## 2021-10-26 MED ORDER — SODIUM CHLORIDE 0.9 % IV SOLN
2.0000 g | INTRAVENOUS | Status: DC
  Administered 2021-10-26: 22:00:00 2 g via INTRAVENOUS
  Filled 2021-10-26: qty 20

## 2021-10-26 MED ORDER — IPRATROPIUM-ALBUTEROL 0.5-2.5 (3) MG/3ML IN SOLN
3.0000 mL | Freq: Once | RESPIRATORY_TRACT | Status: AC
  Administered 2021-10-26: 21:00:00 3 mL via RESPIRATORY_TRACT

## 2021-10-26 MED ORDER — IPRATROPIUM-ALBUTEROL 0.5-2.5 (3) MG/3ML IN SOLN
3.0000 mL | Freq: Once | RESPIRATORY_TRACT | Status: AC
  Administered 2021-10-26: 19:00:00 3 mL via RESPIRATORY_TRACT
  Filled 2021-10-26: qty 3

## 2021-10-26 MED ORDER — ONDANSETRON HCL 4 MG/2ML IJ SOLN
4.0000 mg | Freq: Four times a day (QID) | INTRAMUSCULAR | Status: DC | PRN
  Administered 2021-10-27: 09:00:00 4 mg via INTRAVENOUS
  Filled 2021-10-26: qty 2

## 2021-10-26 MED ORDER — CITALOPRAM HYDROBROMIDE 20 MG PO TABS
40.0000 mg | ORAL_TABLET | Freq: Every day | ORAL | Status: DC
  Administered 2021-10-27 – 2021-10-29 (×3): 40 mg via ORAL
  Filled 2021-10-26 (×3): qty 2

## 2021-10-26 MED ORDER — IPRATROPIUM-ALBUTEROL 0.5-2.5 (3) MG/3ML IN SOLN
3.0000 mL | Freq: Three times a day (TID) | RESPIRATORY_TRACT | Status: AC
  Administered 2021-10-27 – 2021-10-28 (×6): 3 mL via RESPIRATORY_TRACT
  Filled 2021-10-26 (×6): qty 3

## 2021-10-26 MED ORDER — IOHEXOL 350 MG/ML SOLN
75.0000 mL | Freq: Once | INTRAVENOUS | Status: AC | PRN
  Administered 2021-10-27: 100 mL via INTRAVENOUS

## 2021-10-26 MED ORDER — LEVOTHYROXINE SODIUM 112 MCG PO TABS
112.0000 ug | ORAL_TABLET | Freq: Every day | ORAL | Status: DC
  Administered 2021-10-27 – 2021-10-29 (×3): 112 ug via ORAL
  Filled 2021-10-26 (×6): qty 1

## 2021-10-26 MED ORDER — METOPROLOL TARTRATE 5 MG/5ML IV SOLN: 5.0000 mg | INTRAVENOUS | Status: DC | PRN

## 2021-10-26 MED ORDER — METOPROLOL TARTRATE 5 MG/5ML IV SOLN
5.0000 mg | INTRAVENOUS | Status: DC | PRN
  Administered 2021-10-26 – 2021-10-27 (×2): 5 mg via INTRAVENOUS
  Filled 2021-10-26: qty 5

## 2021-10-26 MED ORDER — METHYLPREDNISOLONE SODIUM SUCC 125 MG IJ SOLR
125.0000 mg | Freq: Once | INTRAMUSCULAR | Status: AC
  Administered 2021-10-26: 19:00:00 125 mg via INTRAVENOUS
  Filled 2021-10-26: qty 2

## 2021-10-26 MED ORDER — PANTOPRAZOLE SODIUM 40 MG PO TBEC
40.0000 mg | DELAYED_RELEASE_TABLET | Freq: Every day | ORAL | Status: DC
  Administered 2021-10-27 – 2021-10-29 (×3): 40 mg via ORAL
  Filled 2021-10-26 (×3): qty 1

## 2021-10-26 MED ORDER — ALBUTEROL SULFATE HFA 108 (90 BASE) MCG/ACT IN AERS
1.0000 | INHALATION_SPRAY | Freq: Four times a day (QID) | RESPIRATORY_TRACT | Status: DC | PRN
Start: 2021-10-26 — End: 2021-10-27

## 2021-10-26 MED ORDER — ACETAMINOPHEN 650 MG RE SUPP: 650.0000 mg | Freq: Four times a day (QID) | RECTAL | Status: DC | PRN

## 2021-10-26 MED ORDER — SODIUM CHLORIDE 0.9 % IV SOLN
500.0000 mg | INTRAVENOUS | Status: DC
  Administered 2021-10-26 – 2021-10-27 (×2): 500 mg via INTRAVENOUS
  Filled 2021-10-26: qty 500
  Filled 2021-10-26 (×2): qty 5

## 2021-10-26 MED ORDER — ACETAMINOPHEN 325 MG PO TABS
650.0000 mg | ORAL_TABLET | Freq: Four times a day (QID) | ORAL | Status: DC | PRN
  Administered 2021-10-28 (×3): 650 mg via ORAL
  Filled 2021-10-26 (×3): qty 2

## 2021-10-26 MED ORDER — IPRATROPIUM-ALBUTEROL 0.5-2.5 (3) MG/3ML IN SOLN
9.0000 mL | Freq: Once | RESPIRATORY_TRACT | Status: AC
  Administered 2021-10-26: 18:00:00 9 mL via RESPIRATORY_TRACT
  Filled 2021-10-26: qty 9

## 2021-10-26 NOTE — ED Notes (Signed)
Critical result Lactic acid 2.5, given to Quentin Cornwall, MD.

## 2021-10-26 NOTE — ED Provider Notes (Signed)
[facility] Emergency Department Provider Note    Event Date/Time   First MD Initiated Contact with Patient 10/26/21 1805     (approximate)  I have reviewed the triage vital signs and the nursing notes.   HISTORY  Chief Complaint Wheezing and Shortness of Breath    HPI [name] is a 75 y.o. female with a history of COPD, pneumonia and bronchitis presents the ER with worsening shortness of breath and generalized malaise.  Feels like she is wheezing quite a bit has had some chills and productive cough.  Has not had any steroids from a month.  Denies any pain when taking deep inspiration.  Is been compliant with her medications.  Has had some improvement with her nebulizer treatment but feels that short-lived.  Denies any nausea or vomiting.  No lower extremity swelling.  Recently diagnosed with lung cancer status post radiation treatment. Past Medical History:  Diagnosis Date   Allergy    Asthma    Back pain    Bronchitis    COPD (chronic obstructive pulmonary disease) (HCC)    Degenerative disc disease, lumbar    Depression    FHx: cholecystectomy    GERD (gastroesophageal reflux disease)    H/O: hysterectomy    Headache    Hypertension    Murmur    Osteoporosis    Pneumonia 03/31/16   being treated by PCP   Restless legs syndrome    Scoliosis    Shingles    Thyroid disease    Family History  Problem Relation Age of Onset   Kidney disease Father    Heart disease Mother    Diabetes Mother    Past Surgical History:  Procedure Laterality Date   ABDOMINAL HYSTERECTOMY     APPENDECTOMY     BACK SURGERY     CHOLECYSTECTOMY     COLONOSCOPY WITH PROPOFOL N/A 01/16/2018   Procedure: COLONOSCOPY WITH PROPOFOL;  Surgeon: Toledo, Benay Pike, MD;  Location: [facility] ENDOSCOPY;  Service: Gastroenterology;  Laterality: N/A;   COLONOSCOPY WITH PROPOFOL N/A 01/21/2021   Procedure: COLONOSCOPY WITH PROPOFOL;  Surgeon: Lin Landsman, MD;  Location:  [facility] ENDOSCOPY;  Service: Gastroenterology;  Laterality: N/A;   ESOPHAGOGASTRODUODENOSCOPY (EGD) WITH PROPOFOL N/A 01/16/2018   Procedure: ESOPHAGOGASTRODUODENOSCOPY (EGD) WITH PROPOFOL;  Surgeon: Toledo, Benay Pike, MD;  Location: [facility] ENDOSCOPY;  Service: Gastroenterology;  Laterality: N/A;   ESOPHAGOGASTRODUODENOSCOPY (EGD) WITH PROPOFOL N/A 01/21/2021   Procedure: ESOPHAGOGASTRODUODENOSCOPY (EGD) WITH PROPOFOL;  Surgeon: Lin Landsman, MD;  Location: [facility] ENDOSCOPY;  Service: Gastroenterology;  Laterality: N/A;   JOINT REPLACEMENT Left 1998   shoulder   NECK SURGERY Bilateral    ROTATOR CUFF REPAIR Right    SHOULDER OPEN ROTATOR CUFF REPAIR Right    SHOULDER SURGERY Left    replacement   Patient Active Problem List   Diagnosis Date Noted   PNA (pneumonia) 10/26/2021   Chronic respiratory failure with hypoxia ([place]) 02/14/2021   Hiatal hernia 01/23/2021   Left upper lobe pulmonary nodule 01/23/2021   Symptomatic anemia 01/21/2021   GI bleeding 01/20/2021   GERD (gastroesophageal reflux disease) 01/20/2021   Hypothyroidism 01/20/2021   Depression 01/20/2021   COPD (chronic obstructive pulmonary disease) ([place]) 01/20/2021   History of recent fall 07/30/2020   Fracture of one rib, left side, initial encounter for closed fracture 07/30/2020   Hypoxia 07/30/2020   Chest pain, atypical 07/30/2020   COPD exacerbation ([place]) 07/30/2020   Community acquired pneumonia 03/20/2020   Type 2  diabetes mellitus without complication ([place]) 81/07/3158   Obesity, Class III, BMI 40-49.9 (morbid obesity) ([place]) 03/20/2020   Acute respiratory failure with hypoxia (HCC) 03/19/2020   Hyponatremia 04/27/2019   Hallucinations, visual 04/27/2019   Iron deficiency anemia 02/01/2018   Acute exacerbation of chronic obstructive pulmonary disease (COPD) ([place]) 11/05/2016   Spinal stenosis, lumbar region, with neurogenic claudication 07/08/2015   Sacroiliac joint dysfunction 06/28/2015   Chronic midline low  back pain without sciatica 05/25/2015   Greater trochanteric bursitis 04/10/2015   Sacroiliac joint disease 03/14/2015   Facet syndrome, lumbar 03/14/2015   HTN (hypertension) 03/06/2015   Thyroid disease 03/06/2015   DDD (degenerative disc disease), cervical 03/04/2015   DDD (degenerative disc disease), lumbar 03/04/2015   DJD (degenerative joint disease) of knee 03/04/2015   Arthritis 08/26/2014   Asthma without status asthmaticus 08/26/2014   Hypothyroidism due to acquired atrophy of thyroid 08/26/2014   Neuralgia, neuritis, and radiculitis, unspecified 08/26/2014   Osteoporosis, post-menopausal 08/26/2014      Prior to Admission medications   Medication Sig Start Date End Date Taking? Authorizing Provider  albuterol (PROVENTIL HFA;VENTOLIN HFA) 108 (90 Base) MCG/ACT inhaler Inhale 1-2 puffs into the lungs every 6 (six) hours as needed for wheezing or shortness of breath.    [provider]  amLODipine (NORVASC) 5 MG tablet Take 5 mg by mouth daily. 05/06/21   [provider]  bisacodyl (DULCOLAX) 10 MG suppository Place 1 suppository (10 mg total) rectally daily as needed for severe constipation. Patient not taking: No sig reported 01/25/21   Nicole Kindred A, DO  bisacodyl (DULCOLAX) 5 MG EC tablet Take 1 tablet (5 mg total) by mouth daily as needed for moderate constipation. 01/25/21   Ezekiel Slocumb, DO  Cholecalciferol (VITAMIN D) 2000 UNITS tablet Take 2,000 Units by mouth daily.    [provider]  citalopram (CELEXA) 40 MG tablet Take 40 mg by mouth daily. 12/03/20   [provider]  Fluticasone-Salmeterol (ADVAIR DISKUS) 250-50 MCG/DOSE AEPB Inhale 1 puff into the lungs 2 (two) times daily. Patient not taking: Reported on 03/15/2021 03/22/20 03/22/21  Sharen Hones, MD  furosemide (LASIX) 20 MG tablet Take 20 mg by mouth daily. 05/24/21   [provider]  gabapentin (NEURONTIN) 100 MG capsule Take 2 capsules (200 mg total) by mouth at  bedtime. 05/05/19   Vaughan Basta, MD  INCRUSE ELLIPTA 62.5 MCG/INH AEPB Inhale 1 puff into the lungs daily. Patient not taking: Reported on 09/19/2021 02/08/21   [provider]  levothyroxine (SYNTHROID, LEVOTHROID) 112 MCG tablet Take 112 mcg by mouth daily before breakfast.    [provider]  metoprolol tartrate (LOPRESSOR) 100 MG tablet Take 100 mg by mouth 2 (two) times daily. 04/08/21   [provider]  Multiple Vitamin (MULTIVITAMIN) tablet Take 1 tablet by mouth daily. Reported on 04/05/2016    [provider]  nystatin cream (MYCOSTATIN) Apply topically 3 (three) times daily. 08/01/21   [provider]  omeprazole (PRILOSEC) 20 MG capsule Take 20 mg by mouth daily.     [provider]  potassium chloride SA (K-DUR,KLOR-CON) 20 MEQ tablet Take 20 mEq by mouth daily.    [provider]  predniSONE (DELTASONE) 20 MG tablet Take by mouth. 08/31/21   [provider]  senna-docusate (SENOKOT-S) 8.6-50 MG tablet Take 1 tablet by mouth daily. Patient not taking: Reported on 03/15/2021    [provider]  tiotropium (SPIRIVA HANDIHALER) 18 MCG inhalation capsule Place 1 capsule (18  mcg total) into inhaler and inhale daily. Patient not taking: Reported on 03/24/2021 11/14/17   Laverle Hobby, MD  traZODone (DESYREL) 100 MG tablet Take 100 mg by mouth at bedtime. 12/30/20   [provider]  vitamin B-12 1000 MCG tablet Take 1 tablet (1,000 mcg total) by mouth daily. Patient not taking: Reported on 03/24/2021 01/25/21   Nicole Kindred A, DO    Allergies Levofloxacin, Adhesive [tape], and Other    Social History Social History   Tobacco Use   Smoking status: Former    Packs/day: 1.00    Years: 30.00    Pack years: 30.00    Types: Cigarettes    Quit date: 11/04/2016    Years since quitting: 4.9   Smokeless tobacco: Never  Vaping Use   Vaping Use: Never used  Substance Use Topics   Alcohol  use: No    Alcohol/week: 0.0 standard drinks   Drug use: No    Review of Systems Patient denies headaches, rhinorrhea, blurry vision, numbness, shortness of breath, chest pain, edema, cough, abdominal pain, nausea, vomiting, diarrhea, dysuria, fevers, rashes or hallucinations unless otherwise stated above in HPI. ____________________________________________   PHYSICAL EXAM:  VITAL SIGNS: Vitals:   10/26/21 2000 10/26/21 2030  BP: 114/79 109/65  Pulse: (!) 102 (!) 102  Resp: (!) 23 (!) 25  Temp:    SpO2: 94% 93%    Constitutional: Alert and oriented.  Eyes: Conjunctivae are normal.  Head: Atraumatic. Nose: No congestion/rhinnorhea. Mouth/Throat: Mucous membranes are moist.   Neck: No stridor. Painless ROM.  Cardiovascular: Normal rate, regular rhythm. Grossly normal heart sounds.  Good peripheral circulation. Respiratory: mild tachypnea with use of accessory muscles and diffuse wheeze throughout Gastrointestinal: Soft and nontender. No distention. No abdominal bruits. No CVA tenderness. Genitourinary:  Musculoskeletal: No lower extremity tenderness nor edema.  No joint effusions. Neurologic:  Normal speech and language. No gross focal neurologic deficits are appreciated. No facial droop Skin:  Skin is warm, dry and intact. No rash noted. Psychiatric: Mood and affect are normal. Speech and behavior are normal.  ____________________________________________   LABS (all labs ordered are listed, but only abnormal results are displayed)  Results for orders placed or performed during the hospital encounter of 10/26/21 (from the past 24 hour(s))  Basic metabolic panel     Status: Abnormal   Collection Time: 10/26/21  5:33 PM  Result Value Ref Range   Sodium 136 135 - 145 mmol/L   Potassium 3.9 3.5 - 5.1 mmol/L   Chloride 93 (L) 98 - 111 mmol/L   CO2 33 (H) 22 - 32 mmol/L   Glucose, Bld 127 (H) 70 - 99 mg/dL   BUN 13 8 - 23 mg/dL   Creatinine, Ser 0.55 0.44 - 1.00 mg/dL    Calcium 9.7 8.9 - 10.3 mg/dL   GFR, Estimated >60 >60 mL/min   Anion gap 10 5 - 15  CBC     Status: Abnormal   Collection Time: 10/26/21  5:33 PM  Result Value Ref Range   WBC 14.4 (H) 4.0 - 10.5 K/uL   RBC 3.95 3.87 - 5.11 MIL/uL   Hemoglobin 11.7 (L) 12.0 - 15.0 g/dL   HCT 36.8 36.0 - 46.0 %   MCV 93.2 80.0 - 100.0 fL   MCH 29.6 26.0 - 34.0 pg   MCHC 31.8 30.0 - 36.0 g/dL   RDW 14.2 11.5 - 15.5 %   Platelets 243 150 - 400 K/uL   nRBC 0.0 0.0 - 0.2 %  Troponin I (High Sensitivity)     Status: None   Collection Time: 10/26/21  5:33 PM  Result Value Ref Range   Troponin I (High Sensitivity) 8 <18 ng/L  Resp Panel by RT-PCR (Flu A&B, Covid) Nasopharyngeal Swab     Status: None   Collection Time: 10/26/21  6:30 PM   Specimen: Nasopharyngeal Swab; Nasopharyngeal(NP) swabs in vial transport medium  Result Value Ref Range   SARS Coronavirus 2 by RT PCR NEGATIVE NEGATIVE   Influenza A by PCR NEGATIVE NEGATIVE   Influenza B by PCR NEGATIVE NEGATIVE  Troponin I (High Sensitivity)     Status: None   Collection Time: 10/26/21  7:50 PM  Result Value Ref Range   Troponin I (High Sensitivity) 8 <18 ng/L  Blood gas, venous     Status: Abnormal   Collection Time: 10/26/21  7:50 PM  Result Value Ref Range   pH, Ven 7.34 7.250 - 7.430   pCO2, Ven 75 (HH) 44.0 - 60.0 mmHg   pO2, Ven 38.0 32.0 - 45.0 mmHg   Bicarbonate 40.5 (H) 20.0 - 28.0 mmol/L   Acid-Base Excess 11.9 (H) 0.0 - 2.0 mmol/L   O2 Saturation 68.3 %   Patient temperature 37.0    Collection site VEIN    Sample type VENOUS   Procalcitonin - Baseline     Status: None   Collection Time: 10/26/21  8:27 PM  Result Value Ref Range   Procalcitonin 0.25 ng/mL  Lactic acid, plasma     Status: Abnormal   Collection Time: 10/26/21  8:30 PM  Result Value Ref Range   Lactic Acid, Venous 2.5 (HH) 0.5 - 1.9 mmol/L   ____________________________________________  EKG My review and personal interpretation at Time: 17:39   Indication:  sob  Rate: 90  Rhythm: sinus Axis: left Other: poor r wave progression, nonspecific st abn ____________________________________________  RADIOLOGY  I personally reviewed all radiographic images ordered to evaluate for the above acute complaints and reviewed radiology reports and findings.  These findings were personally discussed with the patient.  Please see medical record for radiology report.  ____________________________________________   PROCEDURES  Procedure(s) performed:  .Critical Care Performed by: Merlyn Lot, MD Authorized by: Merlyn Lot, MD   Critical care provider statement:    Critical care time (minutes):  15   Critical care was necessary to treat or prevent imminent or life-threatening deterioration of the following conditions:  Respiratory failure   Critical care was time spent personally by me on the following activities:  Ordering and performing treatments and interventions, ordering and review of laboratory studies, ordering and review of radiographic studies, pulse oximetry, re-evaluation of patient's condition, review of old charts, obtaining history from patient or surrogate, examination of patient, evaluation of patient's response to treatment, discussions with primary provider, discussions with consultants and development of treatment plan with patient or surrogate    Critical Care performed: yes ____________________________________________   INITIAL IMPRESSION / [place] / ED COURSE  Pertinent labs & imaging results that were available during my care of the patient were reviewed by me and considered in my medical decision making (see chart for details).   DDX: Asthma, copd, CHF, pna, ptx, malignancy, Pe, anemia   [name] is a 75 y.o. who presents to the ED with presentation as described above.  She is afebrile mildly tachypneic tachycardic using accessory muscles but protecting her airway with diffuse wheeze on exam.  Patient  given nebulizer as well as IV Solu-Medrol.  Chest x-ray without evidence of infiltrates.  The patient will be placed on continuous pulse oximetry and telemetry for monitoring.  Laboratory evaluation will be sent to evaluate for the above complaints.     Clinical Course as of 10/26/21 2211  Wed Oct 26, 2021  2025 VBG with hypercapnic respiratory failure in addition to her chronic hypoxic respiratory failure there is some compensation.  She is feeling improved after duo nebs but still having significant wheezing will give additional nebulizer treatments.  She not showing signs of infiltrate does have leukocytosis but is COVID-negative.  Have a lower suspicion for pneumonia that seems more consistent with acute bronchitis.  Have added on procalcitonin. [PR]  2109 Procalcitonin is elevated.  Lactate also elevated suspect this more likely secondary to respiratory effort and hypoxia and sepsis will obtain blood cultures will give IV antibiotics to cover for pneumonia will order CTA to further evaluate for pneumonia but also evaluate for PE.  Does appear stable and appropriate for admission to the hospital. [PR]    Clinical Course User Index [PR] Merlyn Lot, MD    The patient was evaluated in Emergency Department today for the symptoms described in the history of present illness. He/she was evaluated in the context of the global COVID-19 pandemic, which necessitated consideration that the patient might be at risk for infection with the SARS-CoV-2 virus that causes COVID-19. Institutional protocols and algorithms that pertain to the evaluation of patients at risk for COVID-19 are in a state of rapid change based on information released by regulatory bodies including the CDC and federal and state organizations. These policies and algorithms were followed during the patient's care in the ED.  As part of my medical decision making, I reviewed the following data within the [place] notes reviewed and incorporated, Labs reviewed, notes from prior ED visits and [place] Controlled Substance Database   ____________________________________________   FINAL CLINICAL IMPRESSION(S) / ED DIAGNOSES  Final diagnoses:  Acute respiratory failure with hypoxia and hypercapnia (HCC)  COPD exacerbation ([place])      NEW MEDICATIONS STARTED DURING THIS VISIT:  New Prescriptions   No medications on file     Note:  This document was prepared using Dragon voice recognition software and may include unintentional dictation errors.    Merlyn Lot, MD 10/26/21 2213

## 2021-10-26 NOTE — ED Triage Notes (Signed)
Pt to ED POV for wheezing and shob that started this am. Wears 4 L Alfalfa chronic. Reports minor chest pain.  Reports lung cancer.  Does not get chemo  Pt unable to speak in complete sentences. Wheezes noted. Labored breathing.

## 2021-10-26 NOTE — ED Notes (Signed)
Lab to come draw blood.  RN unsuccessful.

## 2021-10-26 NOTE — H&P (Addendum)
History and Physical   [name] PIR:[id number] DOB: 1946-01-10 DOA: 10/26/2021  PCP: Baxter Hire, MD Outpatient Specialists: Dr. Janese Banks, medical oncology Patient coming from: home   I have personally briefly reviewed patient's old medical records in [place].  Chief Concern: Wheezing and dysphagia  HPI: [name] is a 75 y.o. female with medical history significant for COPD, hypertension, obesity, constipation, GERD, depression, anxiety, history of primary left upper lobe lung cancer, who presents emergency department for chief concerns of wheezing and worsening shortness of breath.  She reports that wheezing or shortness of breath started on a.m. during day of presentation.  She reports that she knows to come to the hospital because her voice has changed and she is having worsening shortness of breath.  She denies known sick contacts.  She denies any chest pain, fever, productive cough.  She does not take her prescribed inhalers due to inability to afford medication.  She denies any dysuria, diarrhea, syncope, new swelling in her legs.  Social history: She lives at home by herself.  She is a former tobacco user and quit 5 years ago.  At her peak she was smoking 2 packs/day.  She denies recreational drug use.  ROS: Constitutional: no weight change, no fever ENT/Mouth: no sore throat, no rhinorrhea Eyes: no eye pain, no vision changes Cardiovascular: no chest pain, no dyspnea,  no edema, no palpitations Respiratory: no cough, no sputum, no wheezing Gastrointestinal: no nausea, no vomiting, no diarrhea, no constipation Genitourinary: no urinary incontinence, no dysuria, no hematuria Musculoskeletal: no arthralgias, no myalgias Skin: no skin lesions, no pruritus, Neuro: + weakness, no loss of consciousness, no syncope Psych: no anxiety, no depression, + decrease appetite Heme/Lymph: no bruising, no bleeding  ED Course: Discussed with emergency medicine provider,  patient requiring hospitalization for chief concerns of shortness of breath, meeting sepsis criteria.  Vitals in the emergency department showed temperature 98.4, respiration rate increased at 24, heart rate of 92, and increased to 109, initial blood pressure 130/92, SPO2 was 95% on baseline 4 L nasal cannula.  Labs in the emergency department showed serum sodium 136, potassium 3.9, chloride 93, bicarb 33, BUN of 13, serum creatinine of 0.55, nonfasting blood glucose 127, WBC 14.4, hemoglobin 11.7, platelets of 243.  GFR greater than 60.  Procalcitonin was elevated at 0.25, lactic acid 2.5.  COVID/influenza A/influenza B PCR were negative.  In the emergency department patient was started on azithromycin, ceftriaxone 2 g IV.  Patient was given 3 doses of duo nebs, Solu-Medrol 125 mg IV.  Assessment/Plan  Principal Problem:   PNA (pneumonia) Active Problems:   DDD (degenerative disc disease), cervical   HTN (hypertension)   Thyroid disease   Sacroiliac joint disease   GERD (gastroesophageal reflux disease)   Chronic respiratory failure with hypoxia (HCC)   Thrush   # Pneumonia - CAP # Sepsis - Patient met sepsis criteria with increased heart rate, increased respiration rate, leukocytosis, source of pneumonia - Continue azithromycin and ceftriaxone IV - Sodium chloride 1 L bolus, maintain MAP greater than 60 - Blood cultures x2, UA, urine culture ordered  # History of COPD-patient is not taking her long-acting maintenance inhalers due to inability to afford medication -In acute exacerbation secondary to pneumonia complicated by medication noncompliance - Resumed home albuterol 1 to 2 puff inhalation every 6 hours as needed for wheezing and shortness of breath - DuoNebs 3 times daily scheduled ordered  - Solu-Medrol 80 mg IV daily  #  Thrush-present on admission - Nystatin suspension 4 times daily ordered  # Sinus tachycardia/SVT - Lopressor 5 mg IV every hours as needed for  heart rate greater than 120, 5 doses ordered  # Hypothyroid-levothyroxine 112 mcg daily before breakfast resumed  # History of primary left upper lobe lung cancer-outpatient follow-up with oncologist  Chart reviewed.   DVT prophylaxis: Enoxaparin Code Status: Full code Diet: Heart healthy Family Communication: No Disposition Plan: Pending clinical course Consults called: None at this time Admission status: Telemetry medical, observation  Past Medical History:  Diagnosis Date   Allergy    Asthma    Back pain    Bronchitis    COPD (chronic obstructive pulmonary disease) (HCC)    Degenerative disc disease, lumbar    Depression    FHx: cholecystectomy    GERD (gastroesophageal reflux disease)    H/O: hysterectomy    Headache    Hypertension    Murmur    Osteoporosis    Pneumonia 03/31/16   being treated by PCP   Restless legs syndrome    Scoliosis    Shingles    Thyroid disease    Past Surgical History:  Procedure Laterality Date   ABDOMINAL HYSTERECTOMY     APPENDECTOMY     BACK SURGERY     CHOLECYSTECTOMY     COLONOSCOPY WITH PROPOFOL N/A 01/16/2018   Procedure: COLONOSCOPY WITH PROPOFOL;  Surgeon: Toledo, Benay Pike, MD;  Location: [facility] ENDOSCOPY;  Service: Gastroenterology;  Laterality: N/A;   COLONOSCOPY WITH PROPOFOL N/A 01/21/2021   Procedure: COLONOSCOPY WITH PROPOFOL;  Surgeon: Lin Landsman, MD;  Location: [facility] ENDOSCOPY;  Service: Gastroenterology;  Laterality: N/A;   ESOPHAGOGASTRODUODENOSCOPY (EGD) WITH PROPOFOL N/A 01/16/2018   Procedure: ESOPHAGOGASTRODUODENOSCOPY (EGD) WITH PROPOFOL;  Surgeon: Toledo, Benay Pike, MD;  Location: [facility] ENDOSCOPY;  Service: Gastroenterology;  Laterality: N/A;   ESOPHAGOGASTRODUODENOSCOPY (EGD) WITH PROPOFOL N/A 01/21/2021   Procedure: ESOPHAGOGASTRODUODENOSCOPY (EGD) WITH PROPOFOL;  Surgeon: Lin Landsman, MD;  Location: [facility] ENDOSCOPY;  Service: Gastroenterology;  Laterality: N/A;   JOINT REPLACEMENT Left 1998    shoulder   NECK SURGERY Bilateral    ROTATOR CUFF REPAIR Right    SHOULDER OPEN ROTATOR CUFF REPAIR Right    SHOULDER SURGERY Left    replacement   Social History:  reports that she quit smoking about 4 years ago. Her smoking use included cigarettes. She has a 30.00 pack-year smoking history. She has never used smokeless tobacco. She reports that she does not drink alcohol and does not use drugs.  Allergies  Allergen Reactions   Levofloxacin Shortness Of Breath   Adhesive [Tape] Other (See Comments)   Other Rash    Plastic tape   Family History  Problem Relation Age of Onset   Kidney disease Father    Heart disease Mother    Diabetes Mother    Family history: Family history reviewed and not pertinent.  Prior to Admission medications   Medication Sig Start Date End Date Taking? Authorizing Provider  albuterol (PROVENTIL HFA;VENTOLIN HFA) 108 (90 Base) MCG/ACT inhaler Inhale 1-2 puffs into the lungs every 6 (six) hours as needed for wheezing or shortness of breath.    [provider]  amLODipine (NORVASC) 5 MG tablet Take 5 mg by mouth daily. 05/06/21   [provider]  bisacodyl (DULCOLAX) 10 MG suppository Place 1 suppository (10 mg total) rectally daily as needed for severe constipation. Patient not taking: No sig reported 01/25/21   Nicole Kindred A, DO  bisacodyl (DULCOLAX) 5  MG EC tablet Take 1 tablet (5 mg total) by mouth daily as needed for moderate constipation. 01/25/21   Ezekiel Slocumb, DO  Cholecalciferol (VITAMIN D) 2000 UNITS tablet Take 2,000 Units by mouth daily.    [provider]  citalopram (CELEXA) 40 MG tablet Take 40 mg by mouth daily. 12/03/20   [provider]  Fluticasone-Salmeterol (ADVAIR DISKUS) 250-50 MCG/DOSE AEPB Inhale 1 puff into the lungs 2 (two) times daily. Patient not taking: Reported on 03/15/2021 03/22/20 03/22/21  Sharen Hones, MD  furosemide (LASIX) 20 MG tablet Take 20 mg by mouth daily. 05/24/21   [provider]  gabapentin (NEURONTIN) 100 MG capsule Take 2 capsules (200 mg total) by mouth at bedtime. 05/05/19   Vaughan Basta, MD  INCRUSE ELLIPTA 62.5 MCG/INH AEPB Inhale 1 puff into the lungs daily. Patient not taking: Reported on 09/19/2021 02/08/21   [provider]  levothyroxine (SYNTHROID, LEVOTHROID) 112 MCG tablet Take 112 mcg by mouth daily before breakfast.    [provider]  metoprolol tartrate (LOPRESSOR) 100 MG tablet Take 100 mg by mouth 2 (two) times daily. 04/08/21   [provider]  Multiple Vitamin (MULTIVITAMIN) tablet Take 1 tablet by mouth daily. Reported on 04/05/2016    [provider]  nystatin cream (MYCOSTATIN) Apply topically 3 (three) times daily. 08/01/21   [provider]  omeprazole (PRILOSEC) 20 MG capsule Take 20 mg by mouth daily.     [provider]  potassium chloride SA (K-DUR,KLOR-CON) 20 MEQ tablet Take 20 mEq by mouth daily.    [provider]  predniSONE (DELTASONE) 20 MG tablet Take by mouth. 08/31/21   [provider]  senna-docusate (SENOKOT-S) 8.6-50 MG tablet Take 1 tablet by mouth daily. Patient not taking: Reported on 03/15/2021    [provider]  tiotropium (SPIRIVA HANDIHALER) 18 MCG inhalation capsule Place 1 capsule (18 mcg total) into inhaler and inhale daily. Patient not taking: Reported on 03/24/2021 11/14/17   Laverle Hobby, MD  traZODone (DESYREL) 100 MG tablet Take 100 mg by mouth at bedtime. 12/30/20   [provider]  vitamin B-12 1000 MCG tablet Take 1 tablet (1,000 mcg total) by mouth daily. Patient not taking: Reported on 03/24/2021 01/25/21   Ezekiel Slocumb, DO   Physical Exam: Vitals:   10/26/21 1930 10/26/21 2000 10/26/21 2030 10/26/21 2230  BP: 108/79 114/79 109/65 107/68  Pulse: (!) 107 (!) 102 (!) 102 (!) 109  Resp: (!) 25 (!) 23 (!) 25 (!) 21  Temp:      TempSrc:      SpO2: 95% 94% 93% 91%  Weight:      Height:        Constitutional: appears age-appropriate, NAD, calm, comfortable Eyes: PERRL, lids and conjunctivae normal ENMT: Mucous membranes are moist. Posterior pharynx clear of any exudate or lesions. Age-appropriate dentition.  Mild hearing loss. Neck: normal, supple, no masses, no thyromegaly Respiratory: clear to auscultation bilaterally, + wheezing, no crackles.  Increased respiratory effort.  Increased accessory muscle use.  Cardiovascular: Regular rate and rhythm, no murmurs / rubs / gallops. No extremity edema. 2+ pedal pulses. No carotid bruits.  Abdomen: Morbidly obese abdomen, no tenderness, no masses palpated, no hepatosplenomegaly. Bowel sounds positive.  Musculoskeletal: no clubbing / cyanosis. No joint deformity upper and lower extremities. Good ROM, no contractures, no atrophy. Normal muscle tone.  Skin: no rashes, lesions, ulcers. No induration Neurologic: Sensation intact. Strength 5/5 in all 4.  Psychiatric: Normal judgment and  insight. Alert and oriented x 3. Normal mood.   EKG: independently reviewed, showing sinus rhythm with rate of 90, QTc 433  Chest x-ray on Admission: I personally reviewed and I agree with radiologist reading as below.  DG Chest 1 View  Result Date: 10/26/2021 CLINICAL DATA:  Wheezing short of breath EXAM: CHEST  1 VIEW COMPARISON:  07/29/2020, CT 09/14/2021 FINDINGS: Surgical hardware in the cervical spine. Chronic appearing deformity of the proximal right humerus. Left shoulder replacement. Large hiatal hernia. Mild chronic bronchitic changes. No focal airspace disease, pleural effusion or pneumothorax. Stable cardiomediastinal silhouette with aortic atherosclerosis. Faintly visible left upper lobe lung nodule. IMPRESSION: 1. No acute airspace disease. Known pulmonary nodule in the left upper lobe is better appreciated on CT. 2. Mild chronic bronchitic changes 3. Hiatal hernia Electronically Signed   By: Donavan Foil M.D.   On: 10/26/2021 18:49    Labs on  Admission: I have personally reviewed following labs CBC: Recent Labs  Lab 10/26/21 1733  WBC 14.4*  HGB 11.7*  HCT 36.8  MCV 93.2  PLT 761   Basic Metabolic Panel: Recent Labs  Lab 10/26/21 1733  NA 136  K 3.9  CL 93*  CO2 33*  GLUCOSE 127*  BUN 13  CREATININE 0.55  CALCIUM 9.7   GFR: Estimated Creatinine Clearance: 59.4 mL/min (by C-G formula based on SCr of 0.55 mg/dL).  Urine analysis:    Component Value Date/Time   COLORURINE YELLOW (A) 03/19/2020 0918   APPEARANCEUR CLEAR (A) 03/19/2020 0918   APPEARANCEUR Clear 03/05/2014 1731   LABSPEC 1.015 03/19/2020 0918   LABSPEC 1.009 03/05/2014 1731   PHURINE 5.0 03/19/2020 0918   GLUCOSEU NEGATIVE 03/19/2020 0918   GLUCOSEU Negative 03/05/2014 1731   HGBUR NEGATIVE 03/19/2020 0918   BILIRUBINUR NEGATIVE 03/19/2020 0918   BILIRUBINUR Negative 03/05/2014 1731   [place] 03/19/2020 0918   PROTEINUR NEGATIVE 03/19/2020 0918   NITRITE NEGATIVE 03/19/2020 0918   LEUKOCYTESUR NEGATIVE 03/19/2020 0918   LEUKOCYTESUR Negative 03/05/2014 1731   CRITICAL CARE Performed by: Briant Cedar Amiee Wiley   Total critical care time: 35 minutes  Critical care time was exclusive of separately billable procedures and treating other patients.  Critical care was necessary to treat or prevent imminent or life-threatening deterioration. Circulatory failure  Critical care was time spent personally by me on the following activities: development of treatment plan with patient and/or surrogate as well as nursing, discussions with consultants, evaluation of patient's response to treatment, examination of patient, obtaining history from patient or surrogate, ordering and performing treatments and interventions, ordering and review of laboratory studies, ordering and review of radiographic studies, pulse oximetry and re-evaluation of patient's condition.  Dr. Tobie Poet [facility]  If 7PM-7AM, please contact overnight-coverage provider If  7AM-7PM, please contact day coverage provider [email or web address]  10/26/2021, 11:24 PM

## 2021-10-26 NOTE — Progress Notes (Signed)
PHARMACIST - PHYSICIAN COMMUNICATION  CONCERNING:  Enoxaparin (Lovenox) for DVT Prophylaxis    RECOMMENDATION: Patient was prescribed enoxaprin 40mg  q24 hours for VTE prophylaxis.   Filed Weights   10/26/21 1732  Weight: 91.2 kg (201 lb)    Body mass index is 42.01 kg/m.  Estimated Creatinine Clearance: 59.4 mL/min (by C-G formula based on SCr of 0.55 mg/dL).   Based on [place] patient is candidate for enoxaparin 0.5mg /kg TBW SQ every 24 hours based on BMI being >30.  DESCRIPTION: Pharmacy has adjusted enoxaparin dose per [facility] policy.  Patient is now receiving enoxaparin 0.5 mg/kg every 24 hours   Renda Rolls, PharmD, [facility] 10/26/2021 11:13 PM

## 2021-10-27 ENCOUNTER — Encounter: Payer: Self-pay | Admitting: Internal Medicine

## 2021-10-27 DIAGNOSIS — J9621 Acute and chronic respiratory failure with hypoxia: Secondary | ICD-10-CM | POA: Diagnosis present

## 2021-10-27 DIAGNOSIS — Z79899 Other long term (current) drug therapy: Secondary | ICD-10-CM | POA: Diagnosis not present

## 2021-10-27 DIAGNOSIS — Z9114 Patient's other noncompliance with medication regimen: Secondary | ICD-10-CM | POA: Diagnosis not present

## 2021-10-27 DIAGNOSIS — Z8249 Family history of ischemic heart disease and other diseases of the circulatory system: Secondary | ICD-10-CM | POA: Diagnosis not present

## 2021-10-27 DIAGNOSIS — Z87891 Personal history of nicotine dependence: Secondary | ICD-10-CM | POA: Diagnosis not present

## 2021-10-27 DIAGNOSIS — Z833 Family history of diabetes mellitus: Secondary | ICD-10-CM | POA: Diagnosis not present

## 2021-10-27 DIAGNOSIS — J9611 Chronic respiratory failure with hypoxia: Secondary | ICD-10-CM | POA: Diagnosis not present

## 2021-10-27 DIAGNOSIS — Z923 Personal history of irradiation: Secondary | ICD-10-CM | POA: Diagnosis not present

## 2021-10-27 DIAGNOSIS — J9622 Acute and chronic respiratory failure with hypercapnia: Secondary | ICD-10-CM | POA: Diagnosis present

## 2021-10-27 DIAGNOSIS — G2581 Restless legs syndrome: Secondary | ICD-10-CM | POA: Diagnosis present

## 2021-10-27 DIAGNOSIS — C3412 Malignant neoplasm of upper lobe, left bronchus or lung: Secondary | ICD-10-CM | POA: Diagnosis present

## 2021-10-27 DIAGNOSIS — Z20822 Contact with and (suspected) exposure to covid-19: Secondary | ICD-10-CM | POA: Diagnosis present

## 2021-10-27 DIAGNOSIS — Z79890 Hormone replacement therapy (postmenopausal): Secondary | ICD-10-CM | POA: Diagnosis not present

## 2021-10-27 DIAGNOSIS — R1310 Dysphagia, unspecified: Secondary | ICD-10-CM | POA: Diagnosis present

## 2021-10-27 DIAGNOSIS — E119 Type 2 diabetes mellitus without complications: Secondary | ICD-10-CM | POA: Diagnosis present

## 2021-10-27 DIAGNOSIS — I471 Supraventricular tachycardia: Secondary | ICD-10-CM | POA: Diagnosis present

## 2021-10-27 DIAGNOSIS — Z90710 Acquired absence of both cervix and uterus: Secondary | ICD-10-CM | POA: Diagnosis not present

## 2021-10-27 DIAGNOSIS — F419 Anxiety disorder, unspecified: Secondary | ICD-10-CM | POA: Diagnosis present

## 2021-10-27 DIAGNOSIS — J441 Chronic obstructive pulmonary disease with (acute) exacerbation: Secondary | ICD-10-CM | POA: Diagnosis present

## 2021-10-27 DIAGNOSIS — F32A Depression, unspecified: Secondary | ICD-10-CM | POA: Diagnosis present

## 2021-10-27 DIAGNOSIS — I10 Essential (primary) hypertension: Secondary | ICD-10-CM | POA: Diagnosis present

## 2021-10-27 DIAGNOSIS — K219 Gastro-esophageal reflux disease without esophagitis: Secondary | ICD-10-CM | POA: Diagnosis present

## 2021-10-27 DIAGNOSIS — J383 Other diseases of vocal cords: Secondary | ICD-10-CM | POA: Diagnosis present

## 2021-10-27 DIAGNOSIS — R0602 Shortness of breath: Secondary | ICD-10-CM | POA: Diagnosis present

## 2021-10-27 DIAGNOSIS — E039 Hypothyroidism, unspecified: Secondary | ICD-10-CM | POA: Diagnosis present

## 2021-10-27 DIAGNOSIS — J189 Pneumonia, unspecified organism: Secondary | ICD-10-CM | POA: Diagnosis not present

## 2021-10-27 DIAGNOSIS — B370 Candidal stomatitis: Secondary | ICD-10-CM | POA: Diagnosis present

## 2021-10-27 LAB — CBC WITH DIFFERENTIAL/PLATELET
Abs Immature Granulocytes: 0.04 10*3/uL (ref 0.00–0.07)
Basophils Absolute: 0 10*3/uL (ref 0.0–0.1)
Basophils Relative: 0 %
Eosinophils Absolute: 0 10*3/uL (ref 0.0–0.5)
Eosinophils Relative: 0 %
HCT: 33.2 % — ABNORMAL LOW (ref 36.0–46.0)
Hemoglobin: 10.8 g/dL — ABNORMAL LOW (ref 12.0–15.0)
Immature Granulocytes: 0 %
Lymphocytes Relative: 4 %
Lymphs Abs: 0.5 10*3/uL — ABNORMAL LOW (ref 0.7–4.0)
MCH: 29.9 pg (ref 26.0–34.0)
MCHC: 32.5 g/dL (ref 30.0–36.0)
MCV: 92 fL (ref 80.0–100.0)
Monocytes Absolute: 0.4 10*3/uL (ref 0.1–1.0)
Monocytes Relative: 3 %
Neutro Abs: 12.5 10*3/uL — ABNORMAL HIGH (ref 1.7–7.7)
Neutrophils Relative %: 93 %
Platelets: 206 10*3/uL (ref 150–400)
RBC: 3.61 MIL/uL — ABNORMAL LOW (ref 3.87–5.11)
RDW: 14.6 % (ref 11.5–15.5)
WBC: 13.5 10*3/uL — ABNORMAL HIGH (ref 4.0–10.5)
nRBC: 0 % (ref 0.0–0.2)

## 2021-10-27 LAB — URINALYSIS, COMPLETE (UACMP) WITH MICROSCOPIC
Bacteria, UA: NONE SEEN
Bilirubin Urine: NEGATIVE
Glucose, UA: NEGATIVE mg/dL
Hgb urine dipstick: NEGATIVE
Ketones, ur: 5 mg/dL — AB
Leukocytes,Ua: NEGATIVE
Nitrite: NEGATIVE
Protein, ur: 30 mg/dL — AB
Specific Gravity, Urine: >1.046 — ABNORMAL HIGH (ref 1.005–1.030)
pH: 6 (ref 5.0–8.0)

## 2021-10-27 LAB — BASIC METABOLIC PANEL
Anion gap: 7 (ref 5–15)
BUN: 11 mg/dL (ref 8–23)
CO2: 31 mmol/L (ref 22–32)
Calcium: 9.2 mg/dL (ref 8.9–10.3)
Chloride: 95 mmol/L — ABNORMAL LOW (ref 98–111)
Creatinine, Ser: 0.56 mg/dL (ref 0.44–1.00)
GFR, Estimated: >60 mL/min (ref >60)
Glucose, Bld: 172 mg/dL — ABNORMAL HIGH (ref 70–99)
Potassium: 4 mmol/L (ref 3.5–5.1)
Sodium: 133 mmol/L — ABNORMAL LOW (ref 135–145)

## 2021-10-27 LAB — PROCALCITONIN: Procalcitonin: <0.1 ng/mL

## 2021-10-27 LAB — PROTIME-INR
INR: 1.1 (ref 0.8–1.2)
Prothrombin Time: 13.9 seconds (ref 11.4–15.2)

## 2021-10-27 LAB — MAGNESIUM: Magnesium: 1.5 mg/dL — ABNORMAL LOW (ref 1.7–2.4)

## 2021-10-27 LAB — CORTISOL-AM, BLOOD: Cortisol - AM: 5.9 ug/dL — ABNORMAL LOW (ref 6.7–22.6)

## 2021-10-27 LAB — PHOSPHORUS: Phosphorus: 2.4 mg/dL — ABNORMAL LOW (ref 2.5–4.6)

## 2021-10-27 LAB — LACTIC ACID, PLASMA: Lactic Acid, Venous: 1.6 mmol/L (ref 0.5–1.9)

## 2021-10-27 LAB — BRAIN NATRIURETIC PEPTIDE: B Natriuretic Peptide: 313.8 pg/mL — ABNORMAL HIGH (ref 0.0–100.0)

## 2021-10-27 MED ORDER — ALBUTEROL SULFATE (2.5 MG/3ML) 0.083% IN NEBU: 2.5000 mg | INHALATION_SOLUTION | Freq: Four times a day (QID) | RESPIRATORY_TRACT | Status: DC | PRN

## 2021-10-27 MED ORDER — GUAIFENESIN-DM 100-10 MG/5ML PO SYRP
5.0000 mL | ORAL_SOLUTION | ORAL | Status: DC | PRN
  Administered 2021-10-28 (×3): 5 mL via ORAL
  Filled 2021-10-27 (×3): qty 5

## 2021-10-27 NOTE — Progress Notes (Signed)
Patient arrived to unit from ER in stable condition via hospital bed.

## 2021-10-27 NOTE — Progress Notes (Signed)
Patient given PRN IV lopressor for heart rate sustaining in 130s following brief ambulation from bed to [facility] with walker and assist of NT.

## 2021-10-27 NOTE — Progress Notes (Addendum)
PROGRESS NOTE   [name]            DGU:[id number] DOB: December 14, 1945 DOA: 10/26/2021 PCP: Baxter Hire, MD     Brief Narrative:  [name] is a 75 y.o. female with medical history significant for COPD on 4 L nasal cannula, hypertension, obesity, constipation, GERD, depression, anxiety, history of primary left upper lobe lung cancer, who presents emergency department for chief concerns of wheezing and worsening shortness of breath.  Patient indicates somewhat insidious onset of worsening respiratory status likely over weeks and more noticeably over the past few days.  She indicates poor compliance with her home inhalers due to prohibitive cost.  She has not seen her pulmonologist for quite some time, she was evaluated 2 weeks ago by PCP with no medication changes per herself at that time.  She denies sick contacts recent travel, does not leave the house most days to her baseline respiratory status on 4 L nasal cannula.   Assessment & Plan:   Acute on chronic hypoxic respiratory failure Likely COPD exacerbation Rule out community-acquired pneumonia Concern for worsening primary lung cancer -Continue steroids, nebs for COPD exacerbation: continue antibiotics for possible pneumonia coverage -Chest mass does not appear to be remarkably changed from previous imaging, unlikely obstructive process; especially given somewhat acute onset -Patient indicates poor compliance with LABA/LAMA/ICS due to cost, TOC able to evaluate patient, Trelegy appears to be $47 per month which would be the cheapest route for LABA/LAMA/ICS -Continue Breo/Spiriva due to formulary here, will discharge on Trelegy if patient can afford it -she may benefit from close follow-up with [facility] pulmonology for samples/coupons if she continues to have difficulty affording her inhaled medications -Follow cultures   Vocal cord dysfunction, POA -Patient has notable hoarseness she states is worse over the past few weeks -She  complains of occasional difficulty swallowing, speech to formally evaluate -Discussed case with ENT who recommends ENT follow-up locally here as outpatient for further diagnostic/imaging and evaluation   Oral thrush, POA - Nystatin suspension ongoing   Hypothyroid-levothyroxine 112 mcg daily  History of primary left upper lobe lung cancer-outpatient follow-up with oncologist as scheduled   DVT prophylaxis: Lovenox Code Status: Full Family Communication: None present   Status is: Inpatient   Dispo: The patient is from: Home              Anticipated d/c is to: Home              Anticipated d/c date is: 24 to 48 hours              Patient currently not medically stable for discharge   Consultants:  None   Procedures:  None   Antimicrobials:  Azithromycin, ceftriaxone   Subjective: No acute issues or events overnight.  Respiratory status slightly improving over the past 24 hours.   Objective:       Vitals:    10/27/21 0130 10/27/21 0200 10/27/21 0330 10/27/21 0400  BP: 119/69 119/70 112/88 108/74  Pulse: (!) 101 97   88  Resp: (!) 22 (!) 23 (!) 21 (!) 21  Temp:          TempSrc:          SpO2: 93% 95%   95%  Weight:          Height:            No intake or output data in the 24 hours ending 10/27/21 0809    [employer]  10/26/21 1732  Weight: 91.2 kg      Examination:   General:  Pleasantly resting in bed, No acute distress. HEENT:  Normocephalic atraumatic.  Sclerae nonicteric, noninjected.  Extraocular movements intact bilaterally. Neck: Profound wheeze noted. Lungs: Diminished bilaterally with biphasic wheeze emanating from the neck without overt rales or rhonchi. Heart:  Regular rate and rhythm.  Without murmurs, rubs, or gallops. Abdomen:  Soft, nontender, nondistended.  Without guarding or rebound. Extremities: Without cyanosis, clubbing, edema, or obvious deformity. Vascular:  Dorsalis pedis and posterior tibial pulses palpable bilaterally. Skin:   Warm and dry, no erythema, no ulcerations.   Data Reviewed: I have personally reviewed following labs and imaging studies   CBC: Last Labs      Recent Labs  Lab 10/26/21 1733  WBC 14.4*  HGB 11.7*  HCT 36.8  MCV 93.2  PLT 243      Basic Metabolic Panel: Last Labs      Recent Labs  Lab 10/26/21 1733  NA 136  K 3.9  CL 93*  CO2 33*  GLUCOSE 127*  BUN 13  CREATININE 0.55  CALCIUM 9.7      GFR: Estimated Creatinine Clearance: 59.4 mL/min (by C-G formula based on SCr of 0.55 mg/dL). Liver Function Tests: Last Labs   No results for input(s): AST, ALT, ALKPHOS, BILITOT, PROT, ALBUMIN in the last 168 hours.   Last Labs   No results for input(s): LIPASE, AMYLASE in the last 168 hours.   Last Labs   No results for input(s): AMMONIA in the last 168 hours.   Coagulation Profile: Last Labs   No results for input(s): INR, PROTIME in the last 168 hours.   Cardiac Enzymes: Last Labs   No results for input(s): CKTOTAL, CKMB, CKMBINDEX, TROPONINI in the last 168 hours.   BNP (last 3 results) Recent Labs (within last 365 days)  No results for input(s): PROBNP in the last 8760 hours.   HbA1C: Recent Labs (last 2 labs)   No results for input(s): HGBA1C in the last 72 hours.   CBG: Last Labs   No results for input(s): GLUCAP in the last 168 hours.   Lipid Profile: Recent Labs (last 2 labs)   No results for input(s): CHOL, HDL, LDLCALC, TRIG, CHOLHDL, LDLDIRECT in the last 72 hours.   Thyroid Function Tests: Recent Labs (last 2 labs)   No results for input(s): TSH, T4TOTAL, FREET4, T3FREE, THYROIDAB in the last 72 hours.   Anemia Panel: Recent Labs (last 2 labs)   No results for input(s): VITAMINB12, FOLATE, FERRITIN, TIBC, IRON, RETICCTPCT in the last 72 hours.   Sepsis Labs: Last Labs       Recent Labs  Lab 10/26/21 2027 10/26/21 2030  PROCALCITON 0.25  --   LATICACIDVEN  --  2.5*               Recent Results (from the past 240 hour(s))  Resp  Panel by RT-PCR (Flu A&B, Covid) Nasopharyngeal Swab     Status: None    Collection Time: 10/26/21  6:30 PM    Specimen: Nasopharyngeal Swab; Nasopharyngeal(NP) swabs in vial transport medium  Result Value Ref Range Status    SARS Coronavirus 2 by RT PCR NEGATIVE NEGATIVE Final      Comment: (NOTE) SARS-CoV-2 target nucleic acids are NOT DETECTED.   The SARS-CoV-2 RNA is generally detectable in upper respiratory specimens during the acute phase of infection. The lowest concentration of SARS-CoV-2 viral copies this assay can detect is  138 copies/mL. A negative result does not preclude SARS-Cov-2 infection and should not be used as the sole basis for treatment or other patient management decisions. A negative result may occur with  improper specimen collection/handling, submission of specimen other than nasopharyngeal swab, presence of viral mutation(s) within the areas targeted by this assay, and inadequate number of viral copies(<138 copies/mL). A negative result must be combined with clinical observations, patient history, and epidemiological information. The expected result is Negative.   Fact Sheet for Patients:  EntrepreneurPulse.com.au   Fact Sheet for Healthcare Providers:  IncredibleEmployment.be   This test is no t yet approved or cleared by the [place] FDA and  has been authorized for detection and/or diagnosis of SARS-CoV-2 by FDA under an Emergency Use Authorization (EUA). This EUA will remain  in effect (meaning this test can be used) for the duration of the COVID-19 declaration under Section 564(b)(1) of the Act, 21 U.S.C.section 360bbb-3(b)(1), unless the authorization is terminated  or revoked sooner.           Influenza A by PCR NEGATIVE NEGATIVE Final    Influenza B by PCR NEGATIVE NEGATIVE Final      Comment: (NOTE) The Xpert Xpress SARS-CoV-2/FLU/RSV plus assay is intended as an aid in the diagnosis of influenza from  Nasopharyngeal swab specimens and should not be used as a sole basis for treatment. Nasal washings and aspirates are unacceptable for Xpert Xpress SARS-CoV-2/FLU/RSV testing.   Fact Sheet for Patients: EntrepreneurPulse.com.au   Fact Sheet for Healthcare Providers: IncredibleEmployment.be   This test is not yet approved or cleared by the [place] FDA and has been authorized for detection and/or diagnosis of SARS-CoV-2 by FDA under an Emergency Use Authorization (EUA). This EUA will remain in effect (meaning this test can be used) for the duration of the COVID-19 declaration under Section 564(b)(1) of the Act, 21 U.S.C. section 360bbb-3(b)(1), unless the authorization is terminated or revoked.   Performed at [facility], [address] [place], [place] [address]    Blood culture (routine x 2)     Status: None (Preliminary result)    Collection Time: 10/26/21  8:00 PM    Specimen: BLOOD  Result Value Ref Range Status    Specimen Description BLOOD LEFT ASSIST CONTROL   Final    Special Requests     Final      BOTTLES DRAWN AEROBIC AND ANAEROBIC Blood Culture results may not be optimal due to an inadequate volume of blood received in culture bottles    Culture     Final      NO GROWTH < 12 HOURS Performed at [facility], [address], [place], [place] [address]      Report Status PENDING   Incomplete              Radiology Studies:  Imaging Results (Last 48 hours)  DG Chest 1 View   Result Date: 10/26/2021 CLINICAL DATA:  Wheezing short of breath EXAM: CHEST  1 VIEW COMPARISON:  07/29/2020, CT 09/14/2021 FINDINGS: Surgical hardware in the cervical spine. Chronic appearing deformity of the proximal right humerus. Left shoulder replacement. Large hiatal hernia. Mild chronic bronchitic changes. No focal airspace disease, pleural effusion or pneumothorax. Stable cardiomediastinal silhouette with aortic  atherosclerosis. Faintly visible left upper lobe lung nodule. IMPRESSION: 1. No acute airspace disease. Known pulmonary nodule in the left upper lobe is better appreciated on CT. 2. Mild chronic bronchitic changes 3. Hiatal hernia Electronically Signed   By:  Donavan Foil M.D.   On: 10/26/2021 18:49    CT Angio Chest PE W and/or Wo Contrast   Result Date: 10/27/2021 CLINICAL DATA:  Wheezing and shortness of breath. EXAM: CT ANGIOGRAPHY CHEST WITH CONTRAST TECHNIQUE: Multidetector CT imaging of the chest was performed using the standard protocol during bolus administration of intravenous contrast. Multiplanar CT image reconstructions and MIPs were obtained to evaluate the vascular anatomy. CONTRAST:  134mL OMNIPAQUE IOHEXOL 350 MG/ML SOLN COMPARISON:  September 14, 2021 FINDINGS: Cardiovascular: There is mild to moderate severity calcification of the aortic arch, without evidence of aortic aneurysm or dissection. Satisfactory opacification of the pulmonary arteries to the segmental level. No evidence of pulmonary embolism. Normal heart size with mild coronary artery calcification. No pericardial effusion. Mediastinum/Nodes: Small, stable AP window and pretracheal lymph nodes are seen. Mild peribronchial soft tissue thickening is seen involving the right and left mainstem bronchi, as well as the right upper lobe bronchus (axial CT images 29 through 36, CT series 5). Subsequent narrowing of the upper lobe branch of the right mainstem bronchus is seen. This represents a new finding. The thyroid gland and esophagus demonstrate no significant findings. Lungs/Pleura: Mild, stable posterior right apical atelectasis and pleural thickening is seen. Mild atelectasis is also seen within the posterior aspect of the bilateral lung bases, left greater than right. This is increased in severity when compared to the prior study. A 1.8 cm x 1.4 cm ill-defined lung nodule is again seen within the posterior aspect of the left upper  lobe (axial CT images 16 through 19, CT series 7). This measured approximately 1.7 cm x 1.2 cm on the prior exam. An ill-defined 6 mm pleural based noncalcified lung nodule is seen within the anterior lateral aspect of the left upper lobe (axial CT image 17, CT series 7. This represents a new finding when compared to the prior study. There is no evidence of a pleural effusion or pneumothorax. Upper Abdomen: A large, stable paraesophageal hernia is seen along the posteromedial aspect of the left lower lobe. Musculoskeletal: A chronic compression fracture deformity is seen at the level of T11. Postoperative changes are noted along the anterior aspect of the lower cervical spine. Multilevel degenerative changes are seen throughout the thoracic spine. Review of the MIP images confirms the above findings. IMPRESSION: 1. No evidence of pulmonary embolism. 2. Mild posterior right apical and posterior bibasilar atelectatic changes. 3. Ill-defined left upper lobe lung nodule which is very mildly increased in size when compared to the prior study, with a new, ill-defined 6 mm pleural based left upper lobe noncalcified lung nodule. Non-contrast chest CT at 6-12 months is recommended. If the nodule is stable at time of repeat CT, then future CT at 18-24 months (from today's scan) is considered optional for low-risk patients, but is recommended for high-risk patients. This recommendation follows the consensus statement: Guidelines for Management of Incidental Pulmonary Nodules Detected on CT Images: From the Fleischner Society 2017; Radiology 2017; 284:228-243. 4. Large, stable paraesophageal hernia. 5. Chronic compression fracture deformity of the T11 vertebral body. 6. Aortic atherosclerosis. Aortic Atherosclerosis (ICD10-I70.0). Electronically Signed   By: Virgina Norfolk M.D.   On: 10/27/2021 00:55               Scheduled Meds:  citalopram  40 mg Oral Daily   enoxaparin (LOVENOX) injection  0.5 mg/kg Subcutaneous  QHS   ipratropium-albuterol  3 mL Nebulization TID   labetalol         levothyroxine  112 mcg Oral Q0600   methylPREDNISolone (SOLU-MEDROL) injection  80 mg Intravenous Daily   nystatin  5 mL Oral QID   pantoprazole  40 mg Oral Daily    Continuous Infusions:  azithromycin Stopped (10/26/21 2346)   cefTRIAXone (ROCEPHIN)  IV         LOS: 0 days    Time spent: 52min   Sudais Banghart C Armonte Tortorella, DO [facility]   If 7PM-7AM, please contact night-coverage [email or web address]   10/27/2021, 8:09 AM

## 2021-10-28 ENCOUNTER — Encounter: Payer: Self-pay | Admitting: Oncology

## 2021-10-28 ENCOUNTER — Other Ambulatory Visit (HOSPITAL_COMMUNITY): Payer: Self-pay

## 2021-10-28 LAB — URINE CULTURE: Culture: <10000 — AB

## 2021-10-28 LAB — CBC
HCT: 32.6 % — ABNORMAL LOW (ref 36.0–46.0)
Hemoglobin: 10.3 g/dL — ABNORMAL LOW (ref 12.0–15.0)
MCH: 29.3 pg (ref 26.0–34.0)
MCHC: 31.6 g/dL (ref 30.0–36.0)
MCV: 92.6 fL (ref 80.0–100.0)
Platelets: 231 10*3/uL (ref 150–400)
RBC: 3.52 MIL/uL — ABNORMAL LOW (ref 3.87–5.11)
RDW: 14.6 % (ref 11.5–15.5)
WBC: 13.8 10*3/uL — ABNORMAL HIGH (ref 4.0–10.5)
nRBC: 0 % (ref 0.0–0.2)

## 2021-10-28 LAB — BASIC METABOLIC PANEL
Anion gap: 8 (ref 5–15)
BUN: 14 mg/dL (ref 8–23)
CO2: 32 mmol/L (ref 22–32)
Calcium: 8.9 mg/dL (ref 8.9–10.3)
Chloride: 94 mmol/L — ABNORMAL LOW (ref 98–111)
Creatinine, Ser: 0.53 mg/dL (ref 0.44–1.00)
GFR, Estimated: >60 mL/min (ref >60)
Glucose, Bld: 115 mg/dL — ABNORMAL HIGH (ref 70–99)
Potassium: 4.2 mmol/L (ref 3.5–5.1)
Sodium: 134 mmol/L — ABNORMAL LOW (ref 135–145)

## 2021-10-28 MED ORDER — MELATONIN 5 MG PO TABS
2.5000 mg | ORAL_TABLET | Freq: Every day | ORAL | Status: DC
  Filled 2021-10-28: qty 0.5

## 2021-10-28 MED ORDER — AZITHROMYCIN 500 MG PO TABS
500.0000 mg | ORAL_TABLET | Freq: Every day | ORAL | Status: DC
  Administered 2021-10-28 – 2021-10-29 (×2): 500 mg via ORAL
  Filled 2021-10-28 (×2): qty 1

## 2021-10-28 MED ORDER — FLUTICASONE FUROATE-VILANTEROL 200-25 MCG/ACT IN AEPB
1.0000 | INHALATION_SPRAY | Freq: Every day | RESPIRATORY_TRACT | Status: DC
  Administered 2021-10-28 – 2021-10-29 (×2): 1 via RESPIRATORY_TRACT
  Filled 2021-10-28: qty 28

## 2021-10-28 MED ORDER — TIOTROPIUM BROMIDE MONOHYDRATE 18 MCG IN CAPS
18.0000 ug | ORAL_CAPSULE | Freq: Every day | RESPIRATORY_TRACT | Status: DC
  Administered 2021-10-28 – 2021-10-29 (×2): 18 ug via RESPIRATORY_TRACT
  Filled 2021-10-28: qty 5

## 2021-10-28 MED ORDER — MELATONIN 5 MG PO TABS
2.5000 mg | ORAL_TABLET | Freq: Every day | ORAL | Status: DC
  Administered 2021-10-28 (×2): 2.5 mg via ORAL
  Filled 2021-10-28 (×3): qty 0.5

## 2021-10-28 NOTE — TOC Benefit Eligibility Note (Addendum)
Patient Teacher, English as a foreign language completed.    The patient is currently admitted and upon discharge could be taking Breo Ellipta 200 mcg.  The current 30 day co-pay is, $47.00.   The patient is currently admitted and upon discharge could be taking Incruse Ellipta 200 mcg.  The current 30 day co-pay is, $47.00.   The patient is currently admitted and upon discharge could be taking Trelegy Ellipta 200 mcg.  The current 30 day co-pay is, $47.00.   The patient is currently admitted and upon discharge could be taking Dulera 200 mcg.  The current 30 day co-pay is, $100.00.  The patient is currently admitted and upon discharge could be taking Symbicort 160 mcg.  The current 30 day co-pay is, $100.00.  The patient is insured through [place] [place] Patient Advocate Specialist [place] Patient Advocate Team Direct Number: [phone]  Fax: [phone]

## 2021-10-28 NOTE — Evaluation (Signed)
Clinical/Bedside Swallow Evaluation Patient Details  Name: [name] MRN: [id number] Date of Birth: 01-27-1946  Today's Date: 10/28/2021 Time: SLP Start Time (ACUTE ONLY): 65 SLP Stop Time (ACUTE ONLY): 1310 SLP Time Calculation (min) (ACUTE ONLY): 50 min  Past Medical History:  Past Medical History:  Diagnosis Date   Allergy    Asthma    Back pain    Bronchitis    COPD (chronic obstructive pulmonary disease) (HCC)    Degenerative disc disease, lumbar    Depression    FHx: cholecystectomy    GERD (gastroesophageal reflux disease)    H/O: hysterectomy    Headache    Hypertension    Murmur    Osteoporosis    Pneumonia 03/31/16   being treated by PCP   Restless legs syndrome    Scoliosis    Shingles    Thyroid disease    Past Surgical History:  Past Surgical History:  Procedure Laterality Date   ABDOMINAL HYSTERECTOMY     APPENDECTOMY     BACK SURGERY     CHOLECYSTECTOMY     COLONOSCOPY WITH PROPOFOL N/A 01/16/2018   Procedure: COLONOSCOPY WITH PROPOFOL;  Surgeon: Toledo, Benay Pike, MD;  Location: [facility] ENDOSCOPY;  Service: Gastroenterology;  Laterality: N/A;   COLONOSCOPY WITH PROPOFOL N/A 01/21/2021   Procedure: COLONOSCOPY WITH PROPOFOL;  Surgeon: Lin Landsman, MD;  Location: [facility] ENDOSCOPY;  Service: Gastroenterology;  Laterality: N/A;   ESOPHAGOGASTRODUODENOSCOPY (EGD) WITH PROPOFOL N/A 01/16/2018   Procedure: ESOPHAGOGASTRODUODENOSCOPY (EGD) WITH PROPOFOL;  Surgeon: Toledo, Benay Pike, MD;  Location: [facility] ENDOSCOPY;  Service: Gastroenterology;  Laterality: N/A;   ESOPHAGOGASTRODUODENOSCOPY (EGD) WITH PROPOFOL N/A 01/21/2021   Procedure: ESOPHAGOGASTRODUODENOSCOPY (EGD) WITH PROPOFOL;  Surgeon: Lin Landsman, MD;  Location: [facility] ENDOSCOPY;  Service: Gastroenterology;  Laterality: N/A;   JOINT REPLACEMENT Left 1998   shoulder   NECK SURGERY Bilateral    ROTATOR CUFF REPAIR Right    SHOULDER OPEN ROTATOR CUFF REPAIR Right    SHOULDER SURGERY Left     replacement   HPI:  Pt is a 75 y.o. female with medical history significant for COPD on 4 L nasal cannula, hypertension, Obesity, constipation, GERD, depression, anxiety, history of primary left upper lobe lung cancer, who presents emergency department for chief concerns of wheezing and worsening shortness of breath.  Patient indicates somewhat insidious onset of worsening respiratory status likely over weeks and more noticeably over the past few days.  She indicates poor compliance with her home inhalers due to prohibitive cost. She has not seen her pulmonologist for quite some time, she was evaluated 2 weeks ago by PCP with no medication changes per herself at that time.  She denies sick contacts recent travel, does not leave the house most days to her baseline respiratory status on 4 L nasal cannula.   Pt has a "Large, stable paraesophageal hernia.". This can increase Esophageal phase dysmotility which is what pt described when asked more specifically.  CT Angio of Chest: No evidence of pulmonary embolism.  2. Mild posterior right apical and posterior bibasilar atelectatic  changes. Bronchitic changes per CXR.  3. Ill-defined left upper lobe lung nodule which is very mildly  increased in size when compared to the prior study, with a new,  ill-defined 6 mm pleural based left upper lobe noncalcified lung  nodule. Non-contrast chest CT at 6-12 months is recommended. Large, stable paraesophageal hernia.    Assessment / Plan / Recommendation  Clinical Impression  Pt seen for BSE today. She has  h/o BSE in 2021 for similar complaints of Globus during meals. Pt has a "Large, stable paraesophageal hernia.". Per DG Esophagus study in 2021 also, Esophageal dysmotility was noted: "dysmotility within the esophagus with tertiary contractions". Per pt report, she experiences a globus sensation when swallowing solids she feels started ~5 years after cervial spine surgery. She has developed compensatory strategies such  as liquid wash and eliminating bread in her diet.  Also of note, is pt's Baseline WOB w/ ANY exertion including talking. Vocal quality slightly strained, and breath support inadequate for conversation at sentence level. Noted Incentive Spirometer present on table and encouraged use of it daily.  ANY Esophageal phase dysmotility as pt has per objective assessments per chart notes (and what pt described when asked more specifically), can increase risk for Retrograde Backflow of REFLUX material thus risk for aspiration of the REFLUX material and impact on Pulmonary status/vocal cords/voicing.    Pt appears to present w/ adequate oropharyngeal phase swallowing function w/ No overt oropharyngeal phase dysphagia appreciated; No neuromuscular swallowing deficits appreciated. Pt is at reduced risk for aspiration from an oropharyngeal phase standpoint following general aspiration precautions. D/t pt's Baseline Esophageal phase dysmotility, ANY Dysmotility or Regurgitation of Reflux material can increase risk for aspiration of the Reflux material during Retrograde flow thus impact Voicing and Pulmonary status. Pt described ongoing issues w/ Globus feelings during meals; "something is stuck right here in my chest" pointing to sternal notch to upper sternum area. She feels this impacts her ability to take full meals. Pt sat in chair for this evaluation and consumed trials of thin liquids Via Cup/Straw, then tsps of puree and softened solids using a puree w/ no immediate, overt clinical s/s of aspiration noted; clear vocal quality b/t trials, no decline in pulmonary status, no multiple swallows noted post initial pharyngeal swallow. However, pt exhibited increased WOB w/ the exertion of the tasks, so Rest Breaks were given b/t bites/sips to maintain a calmer respiratory presentation.  Oral phase appeared grossly [facility] for bolus management and timely A-P transfer/clearing of material. Min increased Time taken w/ increased  textured trials, then Rest Breaks followed. OM exam was [facility] for oral clearing; lingual/labial movements. No unilateral weakness. Speech clear.   Recommend continue Regular/Mech Soft diet for ease of choice of manageable foods as tolerates(w/ less meats/breads in diet, moistened foods -- more Mech Soft in nature) w/ thin liquids via Cup/less straw use d/t air swallowing; general aspiration precautions. Rest Breaks during meals/oral intake to allow for Esophageal clearing and for managing her respiratory status d/t quick Fatigue and SOB w/ tasks. REFLUX precautions strongly recommended to lessen chance for Regurgitation.  Recommend pt f/u w/ GI for management of Reflux and txs as indicated. Possible f/u w/ ENT for direct viewing of vocal cords and laryngo-pharynx d/t her Baseline pulmonary status -- Reflux irritation such as edema and s/s of LPR can impact voicing. Disucssion and handouts given on Reflux, general aspiration precautions. MD to reconsult ST services if any new needs while admitted. SLP Visit Diagnosis: Dysphagia, pharyngoesophageal phase (R13.14);Dysphagia, unspecified (R13.10) (Esophageal phase dysmotility)    Aspiration Risk  Mild aspiration risk;Risk for inadequate nutrition/hydration (d/t REFLUX backflow from the Esophagus)    Diet Recommendation   Regular/Mech Soft diet for ease of choice of manageable foods as tolerates(w/ less meats/breads in diet, moistened foods -- more Mech Soft in nature) w/ thin liquids via Cup/less straw use d/t air swallowing; general aspiration precautions. Rest Breaks during meals/oral intake to allow for Esophageal  clearing and for managing her respiratory status d/t quick Fatigue and SOB w/ tasks. REFLUX precautions strongly recommended to lessen chance for Regurgitation.   Medication Administration: Whole meds with puree (for safer swallowing and clearing of the Esophagus)    Other  Recommendations Recommended Consults: Consider GI evaluation;Consider  esophageal assessment (Dietician f/u) Oral Care Recommendations: Oral care BID;Oral care before and after PO;Patient independent with oral care (Denture care) Other Recommendations:  (n/a)    Recommendations for follow up therapy are one component of a multi-disciplinary discharge planning process, led by the attending physician.  Recommendations may be updated based on patient status, additional functional criteria and insurance authorization.  Follow up Recommendations No SLP follow up      Assistance Recommended at Discharge Set up Supervision/Assistance  Functional Status Assessment Patient has had a recent decline in their functional status and/or demonstrates limited ability to make significant improvements in function in a reasonable and predictable amount of time  Frequency and Duration  (n/a)   (n/a)       Prognosis Prognosis for Safe Diet Advancement: Fair Barriers to Reach Goals: Time post onset;Severity of deficits Barriers/Prognosis Comment: Esophageal phase deficits      Swallow Study   General Date of Onset: 10/26/21 HPI: Pt is a 75 y.o. female with medical history significant for COPD on 4 L nasal cannula, hypertension, obesity, constipation, GERD, depression, anxiety, history of primary left upper lobe lung cancer, who presents emergency department for chief concerns of wheezing and worsening shortness of breath.  Patient indicates somewhat insidious onset of worsening respiratory status likely over weeks and more noticeably over the past few days.  She indicates poor compliance with her home inhalers due to prohibitive cost. She has not seen her pulmonologist for quite some time, she was evaluated 2 weeks ago by PCP with no medication changes per herself at that time.  She denies sick contacts recent travel, does not leave the house most days to her baseline respiratory status on 4 L nasal cannula. Pt has a "Large, stable paraesophageal hernia.". This can increase  Esophageal phase dysmotility which is what pt described when asked more specifically.  CT Angio of Chest: No evidence of pulmonary embolism.  2. Mild posterior right apical and posterior bibasilar atelectatic  changes. Bronchitic changes per CXR.  3. Ill-defined left upper lobe lung nodule which is very mildly  increased in size when compared to the prior study, with a new,  ill-defined 6 mm pleural based left upper lobe noncalcified lung  nodule. Non-contrast chest CT at 6-12 months is recommended. Large, stable paraesophageal hernia. Type of Study: Bedside Swallow Evaluation Previous Swallow Assessment: 2021 Diet Prior to this Study: Regular;Thin liquids Temperature Spikes Noted:  (wbc 13.8) Respiratory Status: Nasal cannula (4L) History of Recent Intubation: No Behavior/Cognition: Alert;Cooperative;Pleasant mood Oral Cavity Assessment: Within Functional Limits Oral Care Completed by SLP: Recent completion by staff Oral Cavity - Dentition: Dentures, top (native bottom dentition; missing few) Vision: Functional for self-feeding Self-Feeding Abilities: Able to feed self;Needs assist;Needs set up Patient Positioning: Upright in chair (supported head forward) Baseline Vocal Quality: Breathy (SOB w/ any exertion including talking; reduced breath support for sentences) Volitional Cough: Strong Volitional Swallow: Able to elicit    Oral/Motor/Sensory Function Overall Oral Motor/Sensory Function: Within functional limits   Ice Chips Ice chips: Not tested   Thin Liquid Thin Liquid: Within functional limits Presentation: Self Fed;Straw (10 trials)    Nectar Thick Nectar Thick Liquid: Not tested   Honey Thick Honey  Thick Liquid: Not tested   Puree Puree: Within functional limits Presentation: Self Fed;Spoon (5 trials)   Solid     Solid: Impaired (mi increased WOB w/ exertion) Presentation: Self Fed;Spoon (4 trials) Oral Phase Impairments:  (WOB w/ exertion of mastication) Pharyngeal Phase  Impairments:  (none) Other Comments: rest breaks b/t trials        Orinda Kenner, MS, CCC-SLP Speech Language Pathologist Rehab Services [phone] Nandita Mathenia 10/28/2021,3:12 PM

## 2021-10-28 NOTE — TOC CM/SW Note (Signed)
CSW acknowledges consult for medication assistance for advair and spiriva inhalers. Pharmacist is looking into cheaper options.   Dayton Scrape, [place]

## 2021-10-28 NOTE — Progress Notes (Signed)
PROGRESS NOTE    [name]  ONG:[id number] DOB: May 20, 1946 DOA: 10/26/2021 PCP: Baxter Hire, MD   Brief Narrative:  [name] is a 75 y.o. female with medical history significant for COPD on 4 L nasal cannula, hypertension, obesity, constipation, GERD, depression, anxiety, history of primary left upper lobe lung cancer, who presents emergency department for chief concerns of wheezing and worsening shortness of breath.  Patient indicates somewhat insidious onset of worsening respiratory status likely over weeks and more noticeably over the past few days.  She indicates poor compliance with her home inhalers due to prohibitive cost.  She has not seen her pulmonologist for quite some time, she was evaluated 2 weeks ago by PCP with no medication changes per herself at that time.  She denies sick contacts recent travel, does not leave the house most days to her baseline respiratory status on 4 L nasal cannula.   Assessment & Plan:   Acute on chronic hypoxic respiratory failure Likely COPD exacerbation Rule out community-acquired pneumonia Concern for worsening primary lung cancer -Continue steroids, nebs for COPD exacerbation: continue antibiotics for possible pneumonia coverage -Chest mass does not appear to be remarkably changed from previous imaging, unlikely obstructive process; especially given somewhat acute onset -Patient indicates poor compliance with LABA/LAMA/ICS due to cost, TOC able to evaluate patient, Trelegy appears to be $47 per month which would be the cheapest route for LABA/LAMA/ICS -Continue Breo/Spiriva due to formulary here, will discharge on Trelegy if patient can afford it -she may benefit from close follow-up with [facility] pulmonology for samples/coupons if she continues to have difficulty affording her inhaled medications -Follow cultures   Vocal cord dysfunction, POA -Patient has notable hoarseness she states is worse over the past few weeks -She complains  of occasional difficulty swallowing, speech to formally evaluate -Discussed case with ENT who recommends ENT follow-up locally here as outpatient for further diagnostic/imaging and evaluation  Oral thrush, POA - Nystatin suspension ongoing  Hypothyroid-levothyroxine 112 mcg daily  History of primary left upper lobe lung cancer-outpatient follow-up with oncologist as scheduled  DVT prophylaxis: Lovenox Code Status: Full Family Communication: None present  Status is: Inpatient  Dispo: The patient is from: Home              Anticipated d/c is to: Home              Anticipated d/c date is: 24 to 48 hours              Patient currently not medically stable for discharge  Consultants:  None  Procedures:  None  Antimicrobials:  Azithromycin, ceftriaxone  Subjective: No acute issues or events overnight.  Respiratory status slightly improving over the past 24 hours.  Objective: Vitals:   10/27/21 2044 10/28/21 0728 10/28/21 0800 10/28/21 1136  BP: 115/64  122/67 134/75  Pulse: (!) 101  (!) 106 (!) 110  Resp: 18  19 19   Temp: 98.8 F (37.1 C)  98.7 F (37.1 C) 98.2 F (36.8 C)  TempSrc: Oral  Oral Oral  SpO2: 100% 95% 91% 94%  Weight:      Height:        Intake/Output Summary (Last 24 hours) at 10/28/2021 1345 Last data filed at 10/28/2021 0023 Gross per 24 hour  Intake 787.77 ml  Output --  Net 787.77 ml   Filed Weights   10/26/21 1732  Weight: 91.2 kg    Examination:  General:  Pleasantly resting in bed, No acute distress. HEENT:  Normocephalic atraumatic.  Sclerae nonicteric, noninjected.  Extraocular movements intact bilaterally. Neck: Profound wheeze noted. Lungs: Diminished bilaterally with biphasic wheeze emanating from the neck without overt rales or rhonchi. Heart:  Regular rate and rhythm.  Without murmurs, rubs, or gallops. Abdomen:  Soft, nontender, nondistended.  Without guarding or rebound. Extremities: Without cyanosis, clubbing, edema, or  obvious deformity. Vascular:  Dorsalis pedis and posterior tibial pulses palpable bilaterally. Skin:  Warm and dry, no erythema, no ulcerations.  Data Reviewed: I have personally reviewed following labs and imaging studies  CBC: Recent Labs  Lab 10/26/21 1733 10/27/21 0840 10/28/21 0431  WBC 14.4* 13.5* 13.8*  NEUTROABS  --  12.5*  --   HGB 11.7* 10.8* 10.3*  HCT 36.8 33.2* 32.6*  MCV 93.2 92.0 92.6  PLT 243 206 952    Basic Metabolic Panel: Recent Labs  Lab 10/26/21 1733 10/27/21 0840 10/28/21 0431  NA 136 133* 134*  K 3.9 4.0 4.2  CL 93* 95* 94*  CO2 33* 31 32  GLUCOSE 127* 172* 115*  BUN 13 11 14   CREATININE 0.55 0.56 0.53  CALCIUM 9.7 9.2 8.9  MG  --  1.5*  --   PHOS  --  2.4*  --     GFR: Estimated Creatinine Clearance: 59.4 mL/min (by C-G formula based on SCr of 0.53 mg/dL). Liver Function Tests: No results for input(s): AST, ALT, ALKPHOS, BILITOT, PROT, ALBUMIN in the last 168 hours. No results for input(s): LIPASE, AMYLASE in the last 168 hours. No results for input(s): AMMONIA in the last 168 hours. Coagulation Profile: Recent Labs  Lab 10/27/21 0840  INR 1.1   Cardiac Enzymes: No results for input(s): CKTOTAL, CKMB, CKMBINDEX, TROPONINI in the last 168 hours. BNP (last 3 results) No results for input(s): PROBNP in the last 8760 hours. HbA1C: No results for input(s): HGBA1C in the last 72 hours. CBG: No results for input(s): GLUCAP in the last 168 hours. Lipid Profile: No results for input(s): CHOL, HDL, LDLCALC, TRIG, CHOLHDL, LDLDIRECT in the last 72 hours. Thyroid Function Tests: No results for input(s): TSH, T4TOTAL, FREET4, T3FREE, THYROIDAB in the last 72 hours. Anemia Panel: No results for input(s): VITAMINB12, FOLATE, FERRITIN, TIBC, IRON, RETICCTPCT in the last 72 hours. Sepsis Labs: Recent Labs  Lab 10/26/21 2027 10/26/21 2030 10/27/21 0840  PROCALCITON 0.25  --  <0.10  LATICACIDVEN  --  2.5* 1.6     Recent Results (from  the past 240 hour(s))  Resp Panel by RT-PCR (Flu A&B, Covid) Nasopharyngeal Swab     Status: None   Collection Time: 10/26/21  6:30 PM   Specimen: Nasopharyngeal Swab; Nasopharyngeal(NP) swabs in vial transport medium  Result Value Ref Range Status   SARS Coronavirus 2 by RT PCR NEGATIVE NEGATIVE Final    Comment: (NOTE) SARS-CoV-2 target nucleic acids are NOT DETECTED.  The SARS-CoV-2 RNA is generally detectable in upper respiratory specimens during the acute phase of infection. The lowest concentration of SARS-CoV-2 viral copies this assay can detect is 138 copies/mL. A negative result does not preclude SARS-Cov-2 infection and should not be used as the sole basis for treatment or other patient management decisions. A negative result may occur with  improper specimen collection/handling, submission of specimen other than nasopharyngeal swab, presence of viral mutation(s) within the areas targeted by this assay, and inadequate number of viral copies(<138 copies/mL). A negative result must be combined with clinical observations, patient history, and epidemiological information. The expected result is Negative.  Fact Sheet for Patients:  EntrepreneurPulse.com.au  Fact Sheet for Healthcare Providers:  IncredibleEmployment.be  This test is no t yet approved or cleared by the [place] FDA and  has been authorized for detection and/or diagnosis of SARS-CoV-2 by FDA under an Emergency Use Authorization (EUA). This EUA will remain  in effect (meaning this test can be used) for the duration of the COVID-19 declaration under Section 564(b)(1) of the Act, 21 U.S.C.section 360bbb-3(b)(1), unless the authorization is terminated  or revoked sooner.       Influenza A by PCR NEGATIVE NEGATIVE Final   Influenza B by PCR NEGATIVE NEGATIVE Final    Comment: (NOTE) The Xpert Xpress SARS-CoV-2/FLU/RSV plus assay is intended as an aid in the diagnosis of  influenza from Nasopharyngeal swab specimens and should not be used as a sole basis for treatment. Nasal washings and aspirates are unacceptable for Xpert Xpress SARS-CoV-2/FLU/RSV testing.  Fact Sheet for Patients: EntrepreneurPulse.com.au  Fact Sheet for Healthcare Providers: IncredibleEmployment.be  This test is not yet approved or cleared by the [place] FDA and has been authorized for detection and/or diagnosis of SARS-CoV-2 by FDA under an Emergency Use Authorization (EUA). This EUA will remain in effect (meaning this test can be used) for the duration of the COVID-19 declaration under Section 564(b)(1) of the Act, 21 U.S.C. section 360bbb-3(b)(1), unless the authorization is terminated or revoked.  Performed at [facility], [address] [place], [place] [address]   Blood culture (routine x 2)     Status: None (Preliminary result)   Collection Time: 10/26/21  8:00 PM   Specimen: BLOOD  Result Value Ref Range Status   Specimen Description BLOOD LEFT ASSIST CONTROL  Final   Special Requests   Final    BOTTLES DRAWN AEROBIC AND ANAEROBIC Blood Culture results may not be optimal due to an inadequate volume of blood received in culture bottles   Culture   Final    NO GROWTH 1 DAY Performed at [facility], [address], [place], [place] [address]    Report Status PENDING  Incomplete  Culture, blood (Routine X 2) w Reflex to ID Panel     Status: None (Preliminary result)   Collection Time: 10/27/21  8:40 AM   Specimen: BLOOD  Result Value Ref Range Status   Specimen Description BLOOD BLOOD RIGHT HAND  Final   Special Requests   Final    BOTTLES DRAWN AEROBIC AND ANAEROBIC Blood Culture results may not be optimal due to an inadequate volume of blood received in culture bottles   Culture   Final    NO GROWTH < 24 HOURS Performed at [facility], [address] [place], [place] [address]    Report  Status PENDING  Incomplete  Urine Culture     Status: Abnormal   Collection Time: 10/27/21 10:59 AM   Specimen: Urine, Random  Result Value Ref Range Status   Specimen Description   Final    URINE, RANDOM Performed at [facility], [address] [place], [place] [address]    Special Requests   Final    NONE Performed at [facility], [address] [place], [place] [address]    Culture (A)  Final    <10,000 COLONIES/mL INSIGNIFICANT GROWTH Performed at [facility], [place] [address], [place],  [address]    Report Status 10/28/2021 FINAL  Final          Radiology Studies: DG Chest 1 View  Result Date: 10/26/2021 CLINICAL  DATA:  Wheezing short of breath EXAM: CHEST  1 VIEW COMPARISON:  07/29/2020, CT 09/14/2021 FINDINGS: Surgical hardware in the cervical spine. Chronic appearing deformity of the proximal right humerus. Left shoulder replacement. Large hiatal hernia. Mild chronic bronchitic changes. No focal airspace disease, pleural effusion or pneumothorax. Stable cardiomediastinal silhouette with aortic atherosclerosis. Faintly visible left upper lobe lung nodule. IMPRESSION: 1. No acute airspace disease. Known pulmonary nodule in the left upper lobe is better appreciated on CT. 2. Mild chronic bronchitic changes 3. Hiatal hernia Electronically Signed   By: Donavan Foil M.D.   On: 10/26/2021 18:49   CT Angio Chest PE W and/or Wo Contrast  Result Date: 10/27/2021 CLINICAL DATA:  Wheezing and shortness of breath. EXAM: CT ANGIOGRAPHY CHEST WITH CONTRAST TECHNIQUE: Multidetector CT imaging of the chest was performed using the standard protocol during bolus administration of intravenous contrast. Multiplanar CT image reconstructions and MIPs were obtained to evaluate the vascular anatomy. CONTRAST:  126mL OMNIPAQUE IOHEXOL 350 MG/ML SOLN COMPARISON:  September 14, 2021 FINDINGS: Cardiovascular: There is mild to moderate severity calcification of the  aortic arch, without evidence of aortic aneurysm or dissection. Satisfactory opacification of the pulmonary arteries to the segmental level. No evidence of pulmonary embolism. Normal heart size with mild coronary artery calcification. No pericardial effusion. Mediastinum/Nodes: Small, stable AP window and pretracheal lymph nodes are seen. Mild peribronchial soft tissue thickening is seen involving the right and left mainstem bronchi, as well as the right upper lobe bronchus (axial CT images 29 through 36, CT series 5). Subsequent narrowing of the upper lobe branch of the right mainstem bronchus is seen. This represents a new finding. The thyroid gland and esophagus demonstrate no significant findings. Lungs/Pleura: Mild, stable posterior right apical atelectasis and pleural thickening is seen. Mild atelectasis is also seen within the posterior aspect of the bilateral lung bases, left greater than right. This is increased in severity when compared to the prior study. A 1.8 cm x 1.4 cm ill-defined lung nodule is again seen within the posterior aspect of the left upper lobe (axial CT images 16 through 19, CT series 7). This measured approximately 1.7 cm x 1.2 cm on the prior exam. An ill-defined 6 mm pleural based noncalcified lung nodule is seen within the anterior lateral aspect of the left upper lobe (axial CT image 17, CT series 7. This represents a new finding when compared to the prior study. There is no evidence of a pleural effusion or pneumothorax. Upper Abdomen: A large, stable paraesophageal hernia is seen along the posteromedial aspect of the left lower lobe. Musculoskeletal: A chronic compression fracture deformity is seen at the level of T11. Postoperative changes are noted along the anterior aspect of the lower cervical spine. Multilevel degenerative changes are seen throughout the thoracic spine. Review of the MIP images confirms the above findings. IMPRESSION: 1. No evidence of pulmonary embolism. 2.  Mild posterior right apical and posterior bibasilar atelectatic changes. 3. Ill-defined left upper lobe lung nodule which is very mildly increased in size when compared to the prior study, with a new, ill-defined 6 mm pleural based left upper lobe noncalcified lung nodule. Non-contrast chest CT at 6-12 months is recommended. If the nodule is stable at time of repeat CT, then future CT at 18-24 months (from today's scan) is considered optional for low-risk patients, but is recommended for high-risk patients. This recommendation follows the consensus statement: Guidelines for Management of Incidental Pulmonary Nodules Detected on CT Images: From the Fleischner Society 2017;  Radiology 2017; 284:228-243. 4. Large, stable paraesophageal hernia. 5. Chronic compression fracture deformity of the T11 vertebral body. 6. Aortic atherosclerosis. Aortic Atherosclerosis (ICD10-I70.0). Electronically Signed   By: Virgina Norfolk M.D.   On: 10/27/2021 00:55        Scheduled Meds:  citalopram  40 mg Oral Daily   enoxaparin (LOVENOX) injection  0.5 mg/kg Subcutaneous QHS   fluticasone furoate-vilanterol  1 puff Inhalation Daily   ipratropium-albuterol  3 mL Nebulization TID   levothyroxine  112 mcg Oral Q0600   melatonin  2.5 mg Oral QHS   nystatin  5 mL Oral QID   pantoprazole  40 mg Oral Daily   tiotropium  18 mcg Inhalation Daily   Continuous Infusions:  azithromycin 250 mL/hr at 10/28/21 0023   cefTRIAXone (ROCEPHIN)  IV Stopped (10/27/21 2257)     LOS: 1 day   Time spent: 34min  Bayden Gil C Elizardo Chilson, DO [facility]  If 7PM-7AM, please contact night-coverage [email or web address]  10/28/2021, 1:45 PM

## 2021-10-28 NOTE — Progress Notes (Signed)
PHARMACIST - PHYSICIAN COMMUNICATION DR:   Avon Gully CONCERNING: Antibiotic IV to Oral Route Change Policy  RECOMMENDATION: This patient is receiving Azithromycin by the intravenous route.  Based on criteria approved by the Pharmacy and Therapeutics Committee, the antibiotic(s) is/are being converted to the equivalent oral dose form(s).   DESCRIPTION: These criteria include: Patient being treated for a respiratory tract infection, urinary tract infection, cellulitis or clostridium difficile associated diarrhea if on metronidazole The patient is not neutropenic and does not exhibit a GI malabsorption state The patient is eating (either orally or via tube) and/or has been taking other orally administered medications for a least 24 hours The patient is improving clinically and has a Tmax < 100.5  If you have questions about this conversion, please contact the Faith, PharmD, BCPS Clinical Pharmacist

## 2021-10-29 DIAGNOSIS — K219 Gastro-esophageal reflux disease without esophagitis: Secondary | ICD-10-CM

## 2021-10-29 DIAGNOSIS — B370 Candidal stomatitis: Secondary | ICD-10-CM

## 2021-10-29 DIAGNOSIS — J441 Chronic obstructive pulmonary disease with (acute) exacerbation: Principal | ICD-10-CM

## 2021-10-29 DIAGNOSIS — E079 Disorder of thyroid, unspecified: Secondary | ICD-10-CM

## 2021-10-29 MED ORDER — AZITHROMYCIN 500 MG PO TABS: 500.0000 mg | ORAL_TABLET | Freq: Every day | ORAL | 0 refills | Status: AC

## 2021-10-29 MED ORDER — TRELEGY ELLIPTA 100-62.5-25 MCG/ACT IN AEPB: 1.0000 | INHALATION_SPRAY | Freq: Every day | RESPIRATORY_TRACT | 0 refills | Status: DC

## 2021-10-29 MED ORDER — NYSTATIN 100000 UNIT/ML MT SUSP
5.0000 mL | Freq: Four times a day (QID) | OROMUCOSAL | 0 refills | Status: DC
Start: 2021-10-29 — End: 2023-03-29

## 2021-10-29 MED ORDER — NYSTATIN 100000 UNIT/GM EX CREA
TOPICAL_CREAM | Freq: Two times a day (BID) | CUTANEOUS | Status: DC
  Filled 2021-10-29: qty 15

## 2021-10-29 MED ORDER — PREDNISONE 10 MG PO TABS: ORAL_TABLET | ORAL | 0 refills | Status: AC

## 2021-10-29 NOTE — Discharge Summary (Signed)
Physician Discharge Summary  [name] KNL:[id number] DOB: 06-Oct-1946 DOA: 10/26/2021  PCP: Baxter Hire, MD  Admit date: 10/26/2021 Discharge date: 10/29/2021  Admitted From: Home Disposition: Home  Recommendations for Outpatient Follow-up:  Follow up with PCP in 1-2 weeks Please obtain BMP/CBC in one week Please follow up with pulmonology as scheduled:  Home Health: None Equipment/Devices: Oxygen increased to 5 L nasal cannula with exertion  Discharge Condition: Stable CODE STATUS: Full Diet recommendation: Low-salt low-fat diet  Brief/Interim Summary: [name] is a 75 y.o. female with medical history significant for COPD on 4 L nasal cannula, hypertension, obesity, constipation, GERD, depression, anxiety, history of primary left upper lobe lung cancer, who presents emergency department for chief concerns of wheezing and worsening shortness of breath/dyspnea with exertion.  Patient has multifactorial cause for her current symptoms, appears to have vocal cord dysfunction, plan to see ENT in the next 1 to 2 weeks as per their schedule for vocal cord evaluation which is likely the cause of her recent somewhat acute onset of hoarseness sensation of dysphagia as well as audible wheeze, complicated by thrush on nystatin. From respiratory standpoint patient likely having mild COPD exacerbation improving with steroids, patient has been having difficulty affording home inhaler regiment, transition to Trelegy here which combines all of her inhalers into 1 daily inhaler for $42 per month which she agreed was much more reasonable than 3 inhalers at $42 each.  Unlikely community-acquired pneumonia given lack of fevers, leukocytosis likely in the setting of steroids and reactive rather than infectious in nature. Complicating this patient does have known primary lung cancer which may be causing some restrictive and obstructive issues but given her somewhat acute onset and respiratory status  this is less likely to be involved.  Outpatient follow-up with pulmonology oncology ENT, and PCP in the next 2 weeks as discussed.  Patient be discharged on new Trelegy inhaler as well as steroid taper and nystatin oral swish and swallow but otherwise home medications remain the same.    Discharge Instructions   Allergies as of 10/29/2021       Reactions   Levofloxacin Shortness Of Breath   Adhesive [tape] Other (See Comments)   Other Rash   Plastic tape        Medication List     STOP taking these medications    cyanocobalamin 1000 MCG tablet   Fluticasone-Salmeterol 250-50 MCG/DOSE Aepb Commonly known as: Advair Diskus   Incruse Ellipta 62.5 MCG/ACT Aepb Generic drug: umeclidinium bromide   senna-docusate 8.6-50 MG tablet Commonly known as: Senokot-S   tiotropium 18 MCG inhalation capsule Commonly known as: Spiriva HandiHaler   traZODone 100 MG tablet Commonly known as: DESYREL       TAKE these medications    albuterol 108 (90 Base) MCG/ACT inhaler Commonly known as: VENTOLIN HFA Inhale 1-2 puffs into the lungs every 6 (six) hours as needed for wheezing or shortness of breath.   amLODipine 5 MG tablet Commonly known as: NORVASC Take 5 mg by mouth daily.   azithromycin 500 MG tablet Commonly known as: ZITHROMAX Take 1 tablet (500 mg total) by mouth daily for 2 days. Start taking on: October 30, 2021   bisacodyl 5 MG EC tablet Commonly known as: DULCOLAX Take 1 tablet (5 mg total) by mouth daily as needed for moderate constipation. What changed: Another medication with the same name was removed. Continue taking this medication, and follow the directions you see here.   citalopram 40 MG tablet  Commonly known as: CELEXA Take 40 mg by mouth daily.   furosemide 20 MG tablet Commonly known as: LASIX Take 20 mg by mouth daily.   gabapentin 100 MG capsule Commonly known as: NEURONTIN Take 2 capsules (200 mg total) by mouth at bedtime.   levothyroxine  112 MCG tablet Commonly known as: SYNTHROID Take 112 mcg by mouth daily before breakfast.   metoprolol tartrate 100 MG tablet Commonly known as: LOPRESSOR Take 100 mg by mouth 2 (two) times daily.   multivitamin tablet Take 1 tablet by mouth daily. Reported on 04/05/2016   nystatin 100000 UNIT/ML suspension Commonly known as: MYCOSTATIN Take 5 mLs (500,000 Units total) by mouth 4 (four) times daily.   nystatin cream Commonly known as: MYCOSTATIN Apply topically 3 (three) times daily.   omeprazole 20 MG capsule Commonly known as: PRILOSEC Take 20 mg by mouth 2 (two) times daily.   potassium chloride SA 20 MEQ tablet Commonly known as: KLOR-CON M Take 20 mEq by mouth daily.   predniSONE 10 MG tablet Commonly known as: DELTASONE Take 4 tablets (40 mg total) by mouth daily for 3 days, THEN 3 tablets (30 mg total) daily for 3 days, THEN 2 tablets (20 mg total) daily for 3 days, THEN 1 tablet (10 mg total) daily for 3 days. Start taking on: October 29, 2021 What changed:  medication strength See the new instructions.   Trelegy Ellipta 100-62.5-25 MCG/ACT Aepb Generic drug: Fluticasone-Umeclidin-Vilant Inhale 1 puff into the lungs daily.   Vitamin D 50 MCG (2000 UT) tablet Take 2,000 Units by mouth daily.        Allergies  Allergen Reactions   Levofloxacin Shortness Of Breath   Adhesive [Tape] Other (See Comments)   Other Rash    Plastic tape    Consultations: None  Procedures/Studies: DG Chest 1 View  Result Date: 10/26/2021 CLINICAL DATA:  Wheezing short of breath EXAM: CHEST  1 VIEW COMPARISON:  07/29/2020, CT 09/14/2021 FINDINGS: Surgical hardware in the cervical spine. Chronic appearing deformity of the proximal right humerus. Left shoulder replacement. Large hiatal hernia. Mild chronic bronchitic changes. No focal airspace disease, pleural effusion or pneumothorax. Stable cardiomediastinal silhouette with aortic atherosclerosis. Faintly visible left upper  lobe lung nodule. IMPRESSION: 1. No acute airspace disease. Known pulmonary nodule in the left upper lobe is better appreciated on CT. 2. Mild chronic bronchitic changes 3. Hiatal hernia Electronically Signed   By: Donavan Foil M.D.   On: 10/26/2021 18:49   CT Angio Chest PE W and/or Wo Contrast  Result Date: 10/27/2021 CLINICAL DATA:  Wheezing and shortness of breath. EXAM: CT ANGIOGRAPHY CHEST WITH CONTRAST TECHNIQUE: Multidetector CT imaging of the chest was performed using the standard protocol during bolus administration of intravenous contrast. Multiplanar CT image reconstructions and MIPs were obtained to evaluate the vascular anatomy. CONTRAST:  126mL OMNIPAQUE IOHEXOL 350 MG/ML SOLN COMPARISON:  September 14, 2021 FINDINGS: Cardiovascular: There is mild to moderate severity calcification of the aortic arch, without evidence of aortic aneurysm or dissection. Satisfactory opacification of the pulmonary arteries to the segmental level. No evidence of pulmonary embolism. Normal heart size with mild coronary artery calcification. No pericardial effusion. Mediastinum/Nodes: Small, stable AP window and pretracheal lymph nodes are seen. Mild peribronchial soft tissue thickening is seen involving the right and left mainstem bronchi, as well as the right upper lobe bronchus (axial CT images 29 through 36, CT series 5). Subsequent narrowing of the upper lobe branch of the right mainstem bronchus is seen. This  represents a new finding. The thyroid gland and esophagus demonstrate no significant findings. Lungs/Pleura: Mild, stable posterior right apical atelectasis and pleural thickening is seen. Mild atelectasis is also seen within the posterior aspect of the bilateral lung bases, left greater than right. This is increased in severity when compared to the prior study. A 1.8 cm x 1.4 cm ill-defined lung nodule is again seen within the posterior aspect of the left upper lobe (axial CT images 16 through 19, CT series  7). This measured approximately 1.7 cm x 1.2 cm on the prior exam. An ill-defined 6 mm pleural based noncalcified lung nodule is seen within the anterior lateral aspect of the left upper lobe (axial CT image 17, CT series 7. This represents a new finding when compared to the prior study. There is no evidence of a pleural effusion or pneumothorax. Upper Abdomen: A large, stable paraesophageal hernia is seen along the posteromedial aspect of the left lower lobe. Musculoskeletal: A chronic compression fracture deformity is seen at the level of T11. Postoperative changes are noted along the anterior aspect of the lower cervical spine. Multilevel degenerative changes are seen throughout the thoracic spine. Review of the MIP images confirms the above findings. IMPRESSION: 1. No evidence of pulmonary embolism. 2. Mild posterior right apical and posterior bibasilar atelectatic changes. 3. Ill-defined left upper lobe lung nodule which is very mildly increased in size when compared to the prior study, with a new, ill-defined 6 mm pleural based left upper lobe noncalcified lung nodule. Non-contrast chest CT at 6-12 months is recommended. If the nodule is stable at time of repeat CT, then future CT at 18-24 months (from today's scan) is considered optional for low-risk patients, but is recommended for high-risk patients. This recommendation follows the consensus statement: Guidelines for Management of Incidental Pulmonary Nodules Detected on CT Images: From the Fleischner Society 2017; Radiology 2017; 284:228-243. 4. Large, stable paraesophageal hernia. 5. Chronic compression fracture deformity of the T11 vertebral body. 6. Aortic atherosclerosis. Aortic Atherosclerosis (ICD10-I70.0). Electronically Signed   By: Virgina Norfolk M.D.   On: 10/27/2021 00:55     Subjective: No acute issues or events overnight denies nausea vomiting diarrhea constipation any fevers chills or chest pain   Discharge Exam: Vitals:   10/29/21  0819 10/29/21 1207  BP: 127/79 130/79  Pulse: 97 (!) 105  Resp: 20 19  Temp: 98.6 F (37 C) 98.8 F (37.1 C)  SpO2: 94% 98%   Vitals:   10/28/21 2058 10/28/21 2102 10/29/21 0819 10/29/21 1207  BP: 131/74  127/79 130/79  Pulse: (!) 103  97 (!) 105  Resp: 20  20 19   Temp: 98 F (36.7 C)  98.6 F (37 C) 98.8 F (37.1 C)  TempSrc: Oral  Oral Oral  SpO2: 95% 96% 94% 98%  Weight:      Height:        General:  Pleasantly resting in bed, No acute distress. HEENT:  Normocephalic atraumatic.  Sclerae nonicteric, noninjected.  Extraocular movements intact bilaterally. Neck: Audible wheeze at the anterior portion of the neck, audible across room with notable voice changes Lungs: Referred wheeze from above neck but otherwise clear somewhat diminished without rales or rhonchi. Heart:  Regular rate and rhythm.  Without murmurs, rubs, or gallops. Abdomen:  Soft, obese, nontender, nondistended.  Without guarding or rebound. Extremities: Without cyanosis, clubbing, edema, or obvious deformity. Vascular:  Dorsalis pedis and posterior tibial pulses palpable bilaterally. Skin:  Warm and dry, no erythema, no ulcerations.  The results of significant diagnostics from this hospitalization (including imaging, microbiology, ancillary and laboratory) are listed below for reference.     Microbiology: Recent Results (from the past 240 hour(s))  Resp Panel by RT-PCR (Flu A&B, Covid) Nasopharyngeal Swab     Status: None   Collection Time: 10/26/21  6:30 PM   Specimen: Nasopharyngeal Swab; Nasopharyngeal(NP) swabs in vial transport medium  Result Value Ref Range Status   SARS Coronavirus 2 by RT PCR NEGATIVE NEGATIVE Final    Comment: (NOTE) SARS-CoV-2 target nucleic acids are NOT DETECTED.  The SARS-CoV-2 RNA is generally detectable in upper respiratory specimens during the acute phase of infection. The lowest concentration of SARS-CoV-2 viral copies this assay can detect is 138 copies/mL. A  negative result does not preclude SARS-Cov-2 infection and should not be used as the sole basis for treatment or other patient management decisions. A negative result may occur with  improper specimen collection/handling, submission of specimen other than nasopharyngeal swab, presence of viral mutation(s) within the areas targeted by this assay, and inadequate number of viral copies(<138 copies/mL). A negative result must be combined with clinical observations, patient history, and epidemiological information. The expected result is Negative.  Fact Sheet for Patients:  EntrepreneurPulse.com.au  Fact Sheet for Healthcare Providers:  IncredibleEmployment.be  This test is no t yet approved or cleared by the [place] FDA and  has been authorized for detection and/or diagnosis of SARS-CoV-2 by FDA under an Emergency Use Authorization (EUA). This EUA will remain  in effect (meaning this test can be used) for the duration of the COVID-19 declaration under Section 564(b)(1) of the Act, 21 U.S.C.section 360bbb-3(b)(1), unless the authorization is terminated  or revoked sooner.       Influenza A by PCR NEGATIVE NEGATIVE Final   Influenza B by PCR NEGATIVE NEGATIVE Final    Comment: (NOTE) The Xpert Xpress SARS-CoV-2/FLU/RSV plus assay is intended as an aid in the diagnosis of influenza from Nasopharyngeal swab specimens and should not be used as a sole basis for treatment. Nasal washings and aspirates are unacceptable for Xpert Xpress SARS-CoV-2/FLU/RSV testing.  Fact Sheet for Patients: EntrepreneurPulse.com.au  Fact Sheet for Healthcare Providers: IncredibleEmployment.be  This test is not yet approved or cleared by the [place] FDA and has been authorized for detection and/or diagnosis of SARS-CoV-2 by FDA under an Emergency Use Authorization (EUA). This EUA will remain in effect (meaning this test can  be used) for the duration of the COVID-19 declaration under Section 564(b)(1) of the Act, 21 U.S.C. section 360bbb-3(b)(1), unless the authorization is terminated or revoked.  Performed at [facility], [address] [place], [place] [address]   Blood culture (routine x 2)     Status: None (Preliminary result)   Collection Time: 10/26/21  8:00 PM   Specimen: BLOOD  Result Value Ref Range Status   Specimen Description BLOOD LEFT ASSIST CONTROL  Final   Special Requests   Final    BOTTLES DRAWN AEROBIC AND ANAEROBIC Blood Culture results may not be optimal due to an inadequate volume of blood received in culture bottles   Culture   Final    NO GROWTH 2 DAYS Performed at [facility], [address], [place], [place] [address]    Report Status PENDING  Incomplete  Culture, blood (Routine X 2) w Reflex to ID Panel     Status: None (Preliminary result)   Collection Time: 10/27/21  8:40 AM   Specimen: BLOOD  Result Value Ref  Range Status   Specimen Description BLOOD BLOOD RIGHT HAND  Final   Special Requests   Final    BOTTLES DRAWN AEROBIC AND ANAEROBIC Blood Culture results may not be optimal due to an inadequate volume of blood received in culture bottles   Culture   Final    NO GROWTH 2 DAYS Performed at [facility], [address], [place], [place] [address]    Report Status PENDING  Incomplete  Urine Culture     Status: Abnormal   Collection Time: 10/27/21 10:59 AM   Specimen: Urine, Random  Result Value Ref Range Status   Specimen Description   Final    URINE, RANDOM Performed at [facility], [address] [place], [place] [address]    Special Requests   Final    NONE Performed at [facility], [address], [place], [place] [address]    Culture (A)  Final    <10,000 COLONIES/mL INSIGNIFICANT GROWTH Performed at [facility], [place] [address] [place],  [address]    Report Status 10/28/2021 FINAL   Final     Labs: BNP (last 3 results) Recent Labs    01/20/21 1323 10/27/21 0840  BNP 79.7 175.1*   Basic Metabolic Panel: Recent Labs  Lab 10/26/21 1733 10/27/21 0840 10/28/21 0431  NA 136 133* 134*  K 3.9 4.0 4.2  CL 93* 95* 94*  CO2 33* 31 32  GLUCOSE 127* 172* 115*  BUN 13 11 14   CREATININE 0.55 0.56 0.53  CALCIUM 9.7 9.2 8.9  MG  --  1.5*  --   PHOS  --  2.4*  --    Liver Function Tests: No results for input(s): AST, ALT, ALKPHOS, BILITOT, PROT, ALBUMIN in the last 168 hours. No results for input(s): LIPASE, AMYLASE in the last 168 hours. No results for input(s): AMMONIA in the last 168 hours. CBC: Recent Labs  Lab 10/26/21 1733 10/27/21 0840 10/28/21 0431  WBC 14.4* 13.5* 13.8*  NEUTROABS  --  12.5*  --   HGB 11.7* 10.8* 10.3*  HCT 36.8 33.2* 32.6*  MCV 93.2 92.0 92.6  PLT 243 206 231   Cardiac Enzymes: No results for input(s): CKTOTAL, CKMB, CKMBINDEX, TROPONINI in the last 168 hours. BNP: Invalid input(s): POCBNP CBG: No results for input(s): GLUCAP in the last 168 hours. D-Dimer No results for input(s): DDIMER in the last 72 hours. Hgb A1c No results for input(s): HGBA1C in the last 72 hours. Lipid Profile No results for input(s): CHOL, HDL, LDLCALC, TRIG, CHOLHDL, LDLDIRECT in the last 72 hours. Thyroid function studies No results for input(s): TSH, T4TOTAL, T3FREE, THYROIDAB in the last 72 hours.  Invalid input(s): FREET3 Anemia work up No results for input(s): VITAMINB12, FOLATE, FERRITIN, TIBC, IRON, RETICCTPCT in the last 72 hours. Urinalysis    Component Value Date/Time   COLORURINE AMBER (A) 10/27/2021 1059   APPEARANCEUR CLOUDY (A) 10/27/2021 1059   APPEARANCEUR Clear 03/05/2014 1731   LABSPEC >1.046 (H) 10/27/2021 1059   LABSPEC 1.009 03/05/2014 1731   PHURINE 6.0 10/27/2021 1059   GLUCOSEU NEGATIVE 10/27/2021 1059   GLUCOSEU Negative 03/05/2014 1731   HGBUR NEGATIVE 10/27/2021 1059   BILIRUBINUR NEGATIVE 10/27/2021 1059    BILIRUBINUR Negative 03/05/2014 1731   KETONESUR 5 (A) 10/27/2021 1059   PROTEINUR 30 (A) 10/27/2021 1059   NITRITE NEGATIVE 10/27/2021 1059   LEUKOCYTESUR NEGATIVE 10/27/2021 1059   LEUKOCYTESUR Negative 03/05/2014 1731   Sepsis Labs Invalid input(s): PROCALCITONIN,  WBC,  [facility] Recent Results (from the past 240 hour(s))  Resp Panel by RT-PCR (Flu A&B, Covid) Nasopharyngeal Swab     Status: None   Collection Time: 10/26/21  6:30 PM   Specimen: Nasopharyngeal Swab; Nasopharyngeal(NP) swabs in vial transport medium  Result Value Ref Range Status   SARS Coronavirus 2 by RT PCR NEGATIVE NEGATIVE Final    Comment: (NOTE) SARS-CoV-2 target nucleic acids are NOT DETECTED.  The SARS-CoV-2 RNA is generally detectable in upper respiratory specimens during the acute phase of infection. The lowest concentration of SARS-CoV-2 viral copies this assay can detect is 138 copies/mL. A negative result does not preclude SARS-Cov-2 infection and should not be used as the sole basis for treatment or other patient management decisions. A negative result may occur with  improper specimen collection/handling, submission of specimen other than nasopharyngeal swab, presence of viral mutation(s) within the areas targeted by this assay, and inadequate number of viral copies(<138 copies/mL). A negative result must be combined with clinical observations, patient history, and epidemiological information. The expected result is Negative.  Fact Sheet for Patients:  EntrepreneurPulse.com.au  Fact Sheet for Healthcare Providers:  IncredibleEmployment.be  This test is no t yet approved or cleared by the [place] FDA and  has been authorized for detection and/or diagnosis of SARS-CoV-2 by FDA under an Emergency Use Authorization (EUA). This EUA will remain  in effect (meaning this test can be used) for the duration of the COVID-19 declaration under  Section 564(b)(1) of the Act, 21 U.S.C.section 360bbb-3(b)(1), unless the authorization is terminated  or revoked sooner.       Influenza A by PCR NEGATIVE NEGATIVE Final   Influenza B by PCR NEGATIVE NEGATIVE Final    Comment: (NOTE) The Xpert Xpress SARS-CoV-2/FLU/RSV plus assay is intended as an aid in the diagnosis of influenza from Nasopharyngeal swab specimens and should not be used as a sole basis for treatment. Nasal washings and aspirates are unacceptable for Xpert Xpress SARS-CoV-2/FLU/RSV testing.  Fact Sheet for Patients: EntrepreneurPulse.com.au  Fact Sheet for Healthcare Providers: IncredibleEmployment.be  This test is not yet approved or cleared by the [place] FDA and has been authorized for detection and/or diagnosis of SARS-CoV-2 by FDA under an Emergency Use Authorization (EUA). This EUA will remain in effect (meaning this test can be used) for the duration of the COVID-19 declaration under Section 564(b)(1) of the Act, 21 U.S.C. section 360bbb-3(b)(1), unless the authorization is terminated or revoked.  Performed at [facility], [address] [place], [place] [address]   Blood culture (routine x 2)     Status: None (Preliminary result)   Collection Time: 10/26/21  8:00 PM   Specimen: BLOOD  Result Value Ref Range Status   Specimen Description BLOOD LEFT ASSIST CONTROL  Final   Special Requests   Final    BOTTLES DRAWN AEROBIC AND ANAEROBIC Blood Culture results may not be optimal due to an inadequate volume of blood received in culture bottles   Culture   Final    NO GROWTH 2 DAYS Performed at [facility], [address], [place], [place] [address]    Report Status PENDING  Incomplete  Culture, blood (Routine X 2) w Reflex to ID Panel     Status: None (Preliminary result)   Collection Time: 10/27/21  8:40 AM   Specimen: BLOOD  Result Value Ref Range Status   Specimen Description BLOOD  BLOOD RIGHT HAND  Final   Special Requests   Final  BOTTLES DRAWN AEROBIC AND ANAEROBIC Blood Culture results may not be optimal due to an inadequate volume of blood received in culture bottles   Culture   Final    NO GROWTH 2 DAYS Performed at [facility], [address] [place], [place] [address]    Report Status PENDING  Incomplete  Urine Culture     Status: Abnormal   Collection Time: 10/27/21 10:59 AM   Specimen: Urine, Random  Result Value Ref Range Status   Specimen Description   Final    URINE, RANDOM Performed at [facility], [address] [place], [place] [address]    Special Requests   Final    NONE Performed at [facility], [address] [place], [place] [address]    Culture (A)  Final    <10,000 COLONIES/mL INSIGNIFICANT GROWTH Performed at [facility], [place] [address], [place],  [address]    Report Status 10/28/2021 FINAL  Final     Time coordinating discharge: Over 30 minutes  SIGNED:   Little Ishikawa, DO [facility] 10/29/2021, 2:38 PM Pager   If 7PM-7AM, please contact night-coverage [email or web address]

## 2021-10-29 NOTE — Progress Notes (Signed)
SATURATION QUALIFICATIONS: (This note is used to comply with regulatory documentation for home oxygen)  Patient Saturations on 4L  at Rest = 96%  Patient Saturations on 5 Liters of oxygen while Ambulating = 90%

## 2021-11-01 ENCOUNTER — Encounter: Payer: Medicare Other | Attending: Physician Assistant | Admitting: Physician Assistant

## 2021-11-01 ENCOUNTER — Other Ambulatory Visit: Payer: Self-pay

## 2021-11-01 DIAGNOSIS — L89302 Pressure ulcer of unspecified buttock, stage 2: Secondary | ICD-10-CM | POA: Diagnosis present

## 2021-11-01 DIAGNOSIS — G629 Polyneuropathy, unspecified: Secondary | ICD-10-CM | POA: Diagnosis not present

## 2021-11-01 DIAGNOSIS — J449 Chronic obstructive pulmonary disease, unspecified: Secondary | ICD-10-CM | POA: Insufficient documentation

## 2021-11-01 DIAGNOSIS — I10 Essential (primary) hypertension: Secondary | ICD-10-CM | POA: Insufficient documentation

## 2021-11-01 DIAGNOSIS — M1990 Unspecified osteoarthritis, unspecified site: Secondary | ICD-10-CM | POA: Diagnosis not present

## 2021-11-01 LAB — CULTURE, BLOOD (ROUTINE X 2)
Culture: NO GROWTH
Culture: NO GROWTH

## 2021-11-01 NOTE — Progress Notes (Addendum)
NELLINE, LIO ([id number]) Visit Report for 11/01/2021 Chief Complaint Document Details Patient Name: [name], [name]. Date of Service: 11/01/2021 2:30 PM Medical Record Number: [id number] Patient Account Number: [id number] Date of Birth/Sex: Feb 21, 1946 (76 y.o. F) Treating RN: Donnamarie Poag Primary Care Provider: Harrel Lemon Other Clinician: Referring Provider: Harrel Lemon Treating Provider/Extender: Skipper Cliche in Treatment: 2 Information Obtained from: Patient Chief Complaint Left gluteal pressure ulcer Electronic Signature(s) Signed: 11/01/2021 2:31:29 PM By: [name] PA-C Entered By: [name] on 11/01/2021 14:31:28 Pettry, [name] ([id number]) -------------------------------------------------------------------------------- HPI Details Patient Name: [name]. Date of Service: 11/01/2021 2:30 PM Medical Record Number: [id number] Patient Account Number: [id number] Date of Birth/Sex: 09-10-1946 (76 y.o. F) Treating RN: Donnamarie Poag Primary Care Provider: Harrel Lemon Other Clinician: Referring Provider: Harrel Lemon Treating Provider/Extender: Skipper Cliche in Treatment: 2 History of Present Illness HPI Description: 06/20/2019 on evaluation today patient presents for initial inspection here in our office concerning issue that has been going on for the past several weeks with her moisture associated breakdown in the right lower quadrant of the abdominal region underneath her pannus. Subsequently she is not having any pain which is at least good news. Her primary care provider did give her a prescription for nystatin powder as well as doxycycline this ends on the 28th as far as doxycycline is concerned that is just 1 week away. Nonetheless I do not see any evidence of active infection at this time which is good news. With that being said the wound itself does appear to be in a very precarious location right at the crease which will make it more difficult to  heal secondary to moisture issues. Nonetheless I think that potentially an alginate dressing could be of benefit here. No fevers, chills, nausea, vomiting, or diarrhea. She does have a history of COPD, hypertension, obesity, and more recently in this area a tinea corporis infection. 06/27/2019 on evaluation today patient actually appears to be doing I believe okay with regard to her wound. There is much less irritation today compared to the last visit I feel like the nystatin powder has helped in that regard also feel like she is much less macerated than previously noted. With that being said there is a slight hint of new skin growing around the edges of the wound which is good news obviously this is just can take some time. 07/04/19 on evaluation today patient appears to be doing well with regard to her right lower quadrant abdominal ulcer. She is making some progress albeit very slow. There does not appear to be any fungal/yeast infection at this time which is good news. With that being said there does appear to be slow epithelialization around the edge of the wound. The wound bed itself is not quite as healthy as I would like to see though no slough there just really is not a lot of good granulation tissue there is a lot of what appears to be scar tissue which obviously is much harder to get new skin to grow over. I think she may benefit from switching to [facility] dressing to see if this could be helpful in speeding up the healing process. 07/11/2019 on evaluation today patient actually appears to be making excellent progress in my opinion with regard to her abdominal wall ulcer. She has been tolerating the dressing changes without complication. Fortunately there is no signs of active infection at this time. No fevers, chills, nausea, vomiting, or diarrhea. 07/21/2019 on evaluation today  patient actually appears to be doing quite well with regard to her abdominal ulcer. In fact I did have a close  look at this today and it appears to be completely healed. Fortunately there is no signs of active infection at this time and overall she has been doing quite well. The patient is very glad to hear this and really has not had any significant drainage at this time. Readmission: 10/17/2021 upon evaluation today patient presents for readmission here in the clinic concerning issues that she has been having currently with a wound in the left gluteal region. This appears to be fairly superficial and in fact there is some scar tissue here but nonetheless this just appears to be a crease where it is cracked at the base. Honestly I think this is something that could heal quite readily. I have actually seen her previously and the last time was July 21, 2019 at that time we were treating her legs and issues she was having lymphedema and swelling in that regard. Nonetheless I think at this point that the patient is actually doing well in regard to her legs especially considering she does not have any compression on really it would be great if she could have compression in place that would be ideal. I discussed that with her today she states that her daughter-in-law could potentially help her with that that would be good. 11/01/2021 upon evaluation today patient appears to be doing well with regard to her gluteal region. In fact everything appears to be completely healed which is great news. Electronic Signature(s) Signed: 11/01/2021 3:01:49 PM By: [name] PA-C Entered By: [name] on 11/01/2021 15:01:48 Berrocal, [name] ([id number]) -------------------------------------------------------------------------------- Physical Exam Details Patient Name: Breanna Cleverly H. Date of Service: 11/01/2021 2:30 PM Medical Record Number: [id number] Patient Account Number: [id number] Date of Birth/Sex: 08-Dec-1945 (76 y.o. F) Treating RN: Donnamarie Poag Primary Care Provider: Harrel Lemon Other  Clinician: Referring Provider: Harrel Lemon Treating Provider/Extender: Jeri Cos Weeks in Treatment: 2 Constitutional Well-nourished and well-hydrated in no acute distress. Respiratory normal breathing without difficulty. Psychiatric this patient is able to make decisions and demonstrates good insight into disease process. Alert and Oriented x 3. pleasant and cooperative. Notes Upon inspection patient's wound again on the gluteal region showed signs of complete epithelization there is no signs of active infection and overall I think she is doing well she tells me she is getting up at least every hour to stand and move around so that she is not sitting for too long this is perfect I think is why she has feeling so quickly. Electronic Signature(s) Signed: 11/01/2021 3:02:08 PM By: [name] PA-C Entered By: [name] on 11/01/2021 15:02:08 Gallien, [name] ([id number]) -------------------------------------------------------------------------------- Physician Orders Details Patient Name: [name]. Date of Service: 11/01/2021 2:30 PM Medical Record Number: [id number] Patient Account Number: [id number] Date of Birth/Sex: July 11, 1946 (76 y.o. F) Treating RN: Donnamarie Poag Primary Care Provider: Harrel Lemon Other Clinician: Referring Provider: Harrel Lemon Treating Provider/Extender: Skipper Cliche in Treatment: 2 Verbal / Phone Orders: No Diagnosis Coding ICD-10 Coding Code Description L89.302 Pressure ulcer of unspecified buttock, stage 2 I10 Essential (primary) hypertension E66.01 Morbid (severe) obesity due to excess calories J44.9 Chronic obstructive pulmonary disease, unspecified Discharge From [facility] Services o Discharge from [place] to sleep in the bed at night. DO NOT sleep in your recliner. Long hours of sitting in a recliner leads to swelling of  the legs and/or potential wounds on your backside. - keep  pressure off of gluts to prevent more wounds Electronic Signature(s) Signed: 11/01/2021 3:32:18 PM By: Donnamarie Poag Signed: 11/01/2021 4:50:46 PM By: [name] PA-C Entered By: Donnamarie Poag on 11/01/2021 15:01:14 Stitt, [name] ([id number]) -------------------------------------------------------------------------------- Problem List Details Patient Name: Breanna Cleverly H. Date of Service: 11/01/2021 2:30 PM Medical Record Number: [id number] Patient Account Number: [id number] Date of Birth/Sex: 08/24/1946 (76 y.o. F) Treating RN: Donnamarie Poag Primary Care Provider: Harrel Lemon Other Clinician: Referring Provider: Harrel Lemon Treating Provider/Extender: Jeri Cos Weeks in Treatment: 2 Active Problems ICD-10 Encounter Code Description Active Date MDM Diagnosis L89.302 Pressure ulcer of unspecified buttock, stage 2 10/17/2021 No Yes I10 Essential (primary) hypertension 10/17/2021 No Yes E66.01 Morbid (severe) obesity due to excess calories 10/17/2021 No Yes J44.9 Chronic obstructive pulmonary disease, unspecified 10/17/2021 No Yes Inactive Problems Resolved Problems Electronic Signature(s) Signed: 11/01/2021 2:31:07 PM By: [name] PA-C Entered By: [name] on 11/01/2021 14:31:06 Griesinger, [name] ([id number]) -------------------------------------------------------------------------------- Progress Note Details Patient Name: [name]. Date of Service: 11/01/2021 2:30 PM Medical Record Number: [id number] Patient Account Number: [id number] Date of Birth/Sex: 23-Oct-1946 (76 y.o. F) Treating RN: Donnamarie Poag Primary Care Provider: Harrel Lemon Other Clinician: Referring Provider: Harrel Lemon Treating Provider/Extender: Skipper Cliche in Treatment: 2 Subjective Chief Complaint Information obtained from Patient Left gluteal pressure ulcer History of Present Illness (HPI) 06/20/2019 on evaluation today patient presents for initial inspection here in our office  concerning issue that has been going on for the past several weeks with her moisture associated breakdown in the right lower quadrant of the abdominal region underneath her pannus. Subsequently she is not having any pain which is at least good news. Her primary care provider did give her a prescription for nystatin powder as well as doxycycline this ends on the 28th as far as doxycycline is concerned that is just 1 week away. Nonetheless I do not see any evidence of active infection at this time which is good news. With that being said the wound itself does appear to be in a very precarious location right at the crease which will make it more difficult to heal secondary to moisture issues. Nonetheless I think that potentially an alginate dressing could be of benefit here. No fevers, chills, nausea, vomiting, or diarrhea. She does have a history of COPD, hypertension, obesity, and more recently in this area a tinea corporis infection. 06/27/2019 on evaluation today patient actually appears to be doing I believe okay with regard to her wound. There is much less irritation today compared to the last visit I feel like the nystatin powder has helped in that regard also feel like she is much less macerated than previously noted. With that being said there is a slight hint of new skin growing around the edges of the wound which is good news obviously this is just can take some time. 07/04/19 on evaluation today patient appears to be doing well with regard to her right lower quadrant abdominal ulcer. She is making some progress albeit very slow. There does not appear to be any fungal/yeast infection at this time which is good news. With that being said there does appear to be slow epithelialization around the edge of the wound. The wound bed itself is not quite as healthy as I would like to see though no slough there just really is not a lot of good granulation tissue there is a lot of what appears  to be scar  tissue which obviously is much harder to get new skin to grow over. I think she may benefit from switching to [facility] dressing to see if this could be helpful in speeding up the healing process. 07/11/2019 on evaluation today patient actually appears to be making excellent progress in my opinion with regard to her abdominal wall ulcer. She has been tolerating the dressing changes without complication. Fortunately there is no signs of active infection at this time. No fevers, chills, nausea, vomiting, or diarrhea. 07/21/2019 on evaluation today patient actually appears to be doing quite well with regard to her abdominal ulcer. In fact I did have a close look at this today and it appears to be completely healed. Fortunately there is no signs of active infection at this time and overall she has been doing quite well. The patient is very glad to hear this and really has not had any significant drainage at this time. Readmission: 10/17/2021 upon evaluation today patient presents for readmission here in the clinic concerning issues that she has been having currently with a wound in the left gluteal region. This appears to be fairly superficial and in fact there is some scar tissue here but nonetheless this just appears to be a crease where it is cracked at the base. Honestly I think this is something that could heal quite readily. I have actually seen her previously and the last time was July 21, 2019 at that time we were treating her legs and issues she was having lymphedema and swelling in that regard. Nonetheless I think at this point that the patient is actually doing well in regard to her legs especially considering she does not have any compression on really it would be great if she could have compression in place that would be ideal. I discussed that with her today she states that her daughter-in-law could potentially help her with that that would be good. 11/01/2021 upon evaluation today  patient appears to be doing well with regard to her gluteal region. In fact everything appears to be completely healed which is great news. Objective Constitutional Well-nourished and well-hydrated in no acute distress. Vitals Time Taken: 2:38 PM, Height: 58 in, Weight: 201 lbs, BMI: 42, Temperature: 98.3 F, Pulse: 68 bpm, Respiratory Rate: 20 breaths/min, Blood Pressure: 137/81 mmHg. Astorga, Kemonie H. ([id number]) Respiratory normal breathing without difficulty. Psychiatric this patient is able to make decisions and demonstrates good insight into disease process. Alert and Oriented x 3. pleasant and cooperative. General Notes: Upon inspection patient's wound again on the gluteal region showed signs of complete epithelization there is no signs of active infection and overall I think she is doing well she tells me she is getting up at least every hour to stand and move around so that she is not sitting for too long this is perfect I think is why she has feeling so quickly. Integumentary (Hair, Skin) Wound #2 status is Healed - Epithelialized. Original cause of wound was Gradually Appeared. The date acquired was: 09/26/2021. The wound has been in treatment 2 weeks. The wound is located on the Left Gluteus. The wound measures 0cm length x 0cm width x 0cm depth; 0cm^2 area and 0cm^3 volume. There is a none present amount of drainage noted. There is no granulation within the wound bed. There is no necrotic tissue within the wound bed. Assessment Active Problems ICD-10 Pressure ulcer of unspecified buttock, stage 2 Essential (primary) hypertension Morbid (severe) obesity due to excess calories Chronic obstructive  pulmonary disease, unspecified Plan Discharge From [facility]: Discharge from [facility] to sleep in the bed at night. DO NOT sleep in your recliner. Long hours of sitting in a recliner leads to swelling of the legs and/or potential wounds on  your backside. - keep pressure off of gluts to prevent more wounds 1. Would recommend currently that going continue with the wound care measures as before specifically with regard to the offloading I think that skin to be of utmost importance for her to continue doing staying off of this is much as possible for long periods of time. Getting up every hour is perfect. We will see her back for follow-up visit as needed. Electronic Signature(s) Signed: 11/01/2021 3:02:42 PM By: [name] PA-C Entered By: [name] on 11/01/2021 15:02:41 Chalker, [name] ([id number]) -------------------------------------------------------------------------------- SuperBill Details Patient Name: [name]. Date of Service: 11/01/2021 Medical Record Number: [id number] Patient Account Number: [id number] Date of Birth/Sex: [birth year] (77 y.o. F) Treating RN: Donnamarie Poag Primary Care Provider: Harrel Lemon Other Clinician: Referring Provider: Harrel Lemon Treating Provider/Extender: Skipper Cliche in Treatment: 2 Diagnosis Coding ICD-10 Codes Code Description L89.302 Pressure ulcer of unspecified buttock, stage 2 I10 Essential (primary) hypertension E66.01 Morbid (severe) obesity due to excess calories J44.9 Chronic obstructive pulmonary disease, unspecified Facility Procedures CPT4 Code: [id number] Description: [phone] - WOUND CARE VISIT-LEV 2 EST PT Modifier: Quantity: 1 Physician Procedures CPT4 Code: [id number] Description: [id number] - WC PHYS LEVEL 3 - EST PT Modifier: Quantity: 1 CPT4 Code: Description: ICD-10 Diagnosis Description L89.302 Pressure ulcer of unspecified buttock, stage 2 I10 Essential (primary) hypertension E66.01 Morbid (severe) obesity due to excess calories J44.9 Chronic obstructive pulmonary disease, unspecified Modifier: Quantity: Electronic Signature(s) Signed: 11/01/2021 3:32:18 PM By: Donnamarie Poag Signed: 11/01/2021 4:50:46 PM By: [name] PA-C Previous  Signature: 11/01/2021 3:03:02 PM Version By: [name] PA-C Entered By: Donnamarie Poag on 11/01/2021 15:04:08

## 2021-11-01 NOTE — Progress Notes (Addendum)
Breanna, [name] ([id number]) Visit Report for 11/01/2021 Arrival Information Details Patient Name: [name], [name]. Date of Service: 11/01/2021 2:30 PM Medical Record Number: [id number] Patient Account Number: [id number] Date of Birth/Sex: [birth year] (76 y.o. F) Treating RN: Donnamarie Poag Primary Care Teyonna Plaisted: Harrel Lemon Other Clinician: Referring Ameia Morency: Harrel Lemon Treating Brandell Maready/Extender: Skipper Cliche in Treatment: 2 Visit Information History Since Last Visit Added or deleted any medications: Yes Patient Arrived: Wheel Chair Had a fall or experienced change in No Arrival Time: 14:29 activities of daily living that may affect Accompanied By: daughter in law risk of falls: Transfer Assistance: EasyPivot Patient Hospitalized since last visit: Yes Lift Has Dressing in Place as Prescribed: Yes Patient Identification Verified: Yes Pain Present Now: Yes Secondary Verification Process Completed: Yes Patient Requires Transmission-Based No Precautions: Patient Has Alerts: Yes Patient Alerts: NOT diabetic Electronic Signature(s) Signed: 11/01/2021 3:32:18 PM By: Donnamarie Poag Entered By: Donnamarie Poag on 11/01/2021 14:38:07 Bensman, [name] ([id number]) -------------------------------------------------------------------------------- Clinic Level of Care Assessment Details Patient Name: Breanna, [name] Date of Service: 11/01/2021 2:30 PM Medical Record Number: [id number] Patient Account Number: [id number] Date of Birth/Sex: April 09, 1946 (76 y.o. F) Treating RN: Donnamarie Poag Primary Care Alizza Sacra: Harrel Lemon Other Clinician: Referring Celia Gibbons: Harrel Lemon Treating Gabor Lusk/Extender: Skipper Cliche in Treatment: 2 Clinic Level of Care Assessment Items TOOL 4 Quantity Score []  - Use when only an EandM is performed on FOLLOW-UP visit 0 ASSESSMENTS - Nursing Assessment / Reassessment []  - Reassessment of Co-morbidities (includes updates in patient status) 0 []  -  0 Reassessment of Adherence to Treatment Plan ASSESSMENTS - Wound and Skin Assessment / Reassessment X - Simple Wound Assessment / Reassessment - one wound 1 5 []  - 0 Complex Wound Assessment / Reassessment - multiple wounds []  - 0 Dermatologic / Skin Assessment (not related to wound area) ASSESSMENTS - Focused Assessment []  - Circumferential Edema Measurements - multi extremities 0 []  - 0 Nutritional Assessment / Counseling / Intervention []  - 0 Lower Extremity Assessment (monofilament, tuning fork, pulses) []  - 0 Peripheral Arterial Disease Assessment (using hand held doppler) ASSESSMENTS - Ostomy and/or Continence Assessment and Care []  - Incontinence Assessment and Management 0 []  - 0 Ostomy Care Assessment and Management (repouching, etc.) PROCESS - Coordination of Care X - Simple Patient / Family Education for ongoing care 1 15 []  - 0 Complex (extensive) Patient / Family Education for ongoing care X- 1 10 Staff obtains Programmer, systems, Records, Test Results / Process Orders []  - 0 Staff telephones HHA, Nursing Homes / Clarify orders / etc []  - 0 Routine Transfer to another Facility (non-emergent condition) []  - 0 Routine Hospital Admission (non-emergent condition) []  - 0 New Admissions / Biomedical engineer / Ordering NPWT, Apligraf, etc. []  - 0 Emergency Hospital Admission (emergent condition) X- 1 10 Simple Discharge Coordination []  - 0 Complex (extensive) Discharge Coordination PROCESS - Special Needs []  - Pediatric / Minor Patient Management 0 []  - 0 Isolation Patient Management []  - 0 Hearing / Language / Visual special needs []  - 0 Assessment of Community assistance (transportation, D/C planning, etc.) []  - 0 Additional assistance / Altered mentation []  - 0 Support Surface(s) Assessment (bed, cushion, seat, etc.) INTERVENTIONS - Wound Cleansing / Measurement Selmer, Rosi H. ([id number]) X- 1 5 Simple Wound Cleansing - one wound []  - 0 Complex Wound  Cleansing - multiple wounds X- 1 5 Wound Imaging (photographs - any number of wounds) []  - 0 Wound Tracing (instead of photographs) X- 1 5 Simple Wound Measurement -  one wound []  - 0 Complex Wound Measurement - multiple wounds INTERVENTIONS - Wound Dressings []  - Small Wound Dressing one or multiple wounds 0 []  - 0 Medium Wound Dressing one or multiple wounds []  - 0 Large Wound Dressing one or multiple wounds []  - 0 Application of Medications - topical []  - 0 Application of Medications - injection INTERVENTIONS - Miscellaneous []  - External ear exam 0 []  - 0 Specimen Collection (cultures, biopsies, blood, body fluids, etc.) []  - 0 Specimen(s) / Culture(s) sent or taken to Lab for analysis []  - 0 Patient Transfer (multiple staff / Civil Service fast streamer / Similar devices) []  - 0 Simple Staple / Suture removal (25 or less) []  - 0 Complex Staple / Suture removal (26 or more) []  - 0 Hypo / Hyperglycemic Management (close monitor of Blood Glucose) []  - 0 Ankle / Brachial Index (ABI) - do not check if billed separately X- 1 5 Vital Signs Has the patient been seen at the hospital within the last three years: Yes Total Score: 60 Level Of Care: New/Established - Level 2 Electronic Signature(s) Signed: 11/01/2021 3:32:18 PM By: Donnamarie Poag Entered By: Donnamarie Poag on 11/01/2021 15:03:47 Jozwiak, [name] ([id number]) -------------------------------------------------------------------------------- Encounter Discharge Information Details Patient Name: Breanna Cleverly H. Date of Service: 11/01/2021 2:30 PM Medical Record Number: [id number] Patient Account Number: [id number] Date of Birth/Sex: [birth year] (76 y.o. F) Treating RN: Donnamarie Poag Primary Care Cataleia Gade: Harrel Lemon Other Clinician: Referring [employer]: Harrel Lemon Treating Margi Edmundson/Extender: Skipper Cliche in Treatment: 2 Encounter Discharge Information Items Discharge Condition: Stable Ambulatory Status: Wheelchair Discharge  Destination: Home Transportation: Private Auto Accompanied By: caregiver Schedule Follow-up Appointment: No Clinical Summary of Care: Electronic Signature(s) Signed: 11/01/2021 3:32:18 PM By: Donnamarie Poag Entered By: Donnamarie Poag on 11/01/2021 15:05:14 Tarr, [name] ([id number]) -------------------------------------------------------------------------------- Lower Extremity Assessment Details Patient Name: Lucien, Katharine Look H. Date of Service: 11/01/2021 2:30 PM Medical Record Number: [id number] Patient Account Number: [id number] Date of Birth/Sex: 10-Apr-1946 (76 y.o. F) Treating RN: Donnamarie Poag Primary Care Teniola Tseng: Harrel Lemon Other Clinician: Referring Nolton Denis: Harrel Lemon Treating Treson Laura/Extender: Jeri Cos Weeks in Treatment: 2 Electronic Signature(s) Signed: 11/01/2021 3:32:18 PM By: Donnamarie Poag Entered By: Donnamarie Poag on 11/01/2021 14:45:58 Landers, [name] ([id number]) -------------------------------------------------------------------------------- Multi Wound Chart Details Patient Name: [name]. Date of Service: 11/01/2021 2:30 PM Medical Record Number: [id number] Patient Account Number: [id number] Date of Birth/Sex: [birth year] (76 y.o. F) Treating RN: Donnamarie Poag Primary Care Ahron Hulbert: Harrel Lemon Other Clinician: Referring Nickalas Mccarrick: Harrel Lemon Treating Tyray Proch/Extender: Skipper Cliche in Treatment: 2 Vital Signs Height(in): 49 Pulse(bpm): 13 Weight(lbs): 47 Blood Pressure(mmHg): 137/81 Body Mass Index(BMI): 42 Temperature(F): 98.3 Respiratory Rate(breaths/min): 20 Photos: [N/A:N/A] Wound Location: Left Gluteus N/A N/A Wounding Event: Gradually Appeared N/A N/A Primary Etiology: Pressure Ulcer N/A N/A Comorbid History: Anemia, Asthma, Chronic N/A N/A Obstructive Pulmonary Disease (COPD), Hypertension, Osteoarthritis, Neuropathy, Received Radiation Date Acquired: 09/26/2021 N/A N/A Weeks of Treatment: 2 N/A N/A Wound Status: Healed -  Epithelialized N/A N/A Measurements L x W x D (cm) 0x0x0 N/A N/A Area (cm) : 0 N/A N/A Volume (cm) : 0 N/A N/A % Reduction in Area: 100.00% N/A N/A % Reduction in Volume: 100.00% N/A N/A Classification: Category/Stage II N/A N/A Exudate Amount: None Present N/A N/A Granulation Amount: None Present (0%) N/A N/A Necrotic Amount: None Present (0%) N/A N/A Exposed Structures: Fascia: No N/A N/A Fat Layer (Subcutaneous Tissue): No Tendon: No Muscle: No Joint: No Bone: No Treatment Notes Electronic Signature(s) Signed: 11/01/2021 3:32:18 PM By: Lyndel Safe,  Joy Entered By: Donnamarie Poag on 11/01/2021 14:47:09 Shahin, [name] ([id number]) -------------------------------------------------------------------------------- Multi-Disciplinary Care Plan Details Patient Name: YOSHI, VICENCIO. Date of Service: 11/01/2021 2:30 PM Medical Record Number: [id number] Patient Account Number: [id number] Date of Birth/Sex: January 27, 1946 (76 y.o. F) Treating RN: Donnamarie Poag Primary Care Teela Narducci: Harrel Lemon Other Clinician: Referring Prudy Candy: Harrel Lemon Treating Damiana Berrian/Extender: Jeri Cos Weeks in Treatment: 2 Active Inactive Electronic Signature(s) Signed: 11/01/2021 3:32:18 PM By: Donnamarie Poag Entered By: Donnamarie Poag on 11/01/2021 14:46:53 Najjar, [name] ([id number]) -------------------------------------------------------------------------------- Pain Assessment Details Patient Name: Breanna Cleverly H. Date of Service: 11/01/2021 2:30 PM Medical Record Number: [id number] Patient Account Number: [id number] Date of Birth/Sex: 27-Oct-1946 (76 y.o. F) Treating RN: Donnamarie Poag Primary Care Jarrel Knoke: Harrel Lemon Other Clinician: Referring Tanaia Hawkey: Harrel Lemon Treating Theodoros Stjames/Extender: Skipper Cliche in Treatment: 2 Active Problems Location of Pain Severity and Description of Pain Patient Has Paino Yes Site Locations Pain Location: Generalized Pain, Pain in Ulcers Rate the pain. Current  Pain Level: 3 Pain Management and Medication Current Pain Management: Electronic Signature(s) Signed: 11/01/2021 3:32:18 PM By: Donnamarie Poag Entered By: Donnamarie Poag on 11/01/2021 14:40:23 Guidone, [name] ([id number]) -------------------------------------------------------------------------------- Patient/Caregiver Education Details Patient Name: [name]. Date of Service: 11/01/2021 2:30 PM Medical Record Number: [id number] Patient Account Number: [id number] Date of Birth/Gender: 06/04/1946 (76 y.o. F) Treating RN: Donnamarie Poag Primary Care Physician: Harrel Lemon Other Clinician: Referring Physician: Harrel Lemon Treating Physician/Extender: Skipper Cliche in Treatment: 2 Education Assessment Education Provided To: Patient Education Topics Provided Basic Hygiene: Pressure: Wound/Skin Impairment: Electronic Signature(s) Signed: 11/01/2021 3:32:18 PM By: Donnamarie Poag Entered By: Donnamarie Poag on 11/01/2021 15:04:22 Nelles, [name] ([id number]) -------------------------------------------------------------------------------- Wound Assessment Details Patient Name: Breanna Cleverly H. Date of Service: 11/01/2021 2:30 PM Medical Record Number: [id number] Patient Account Number: [id number] Date of Birth/Sex: 02-02-1946 (76 y.o. F) Treating RN: Donnamarie Poag Primary Care Brylin Stanislawski: Harrel Lemon Other Clinician: Referring Roi Jafari: Harrel Lemon Treating Hosie Sharman/Extender: Jeri Cos Weeks in Treatment: 2 Wound Status Wound Number: 2 Primary Pressure Ulcer Etiology: Wound Location: Left Gluteus Wound Healed - Epithelialized Wounding Event: Gradually Appeared Status: Date Acquired: 09/26/2021 Comorbid Anemia, Asthma, Chronic Obstructive Pulmonary Disease Weeks Of Treatment: 2 History: (COPD), Hypertension, Osteoarthritis, Neuropathy, Clustered Wound: No Received Radiation Photos Wound Measurements Length: (cm) 0 Width: (cm) 0 Depth: (cm) 0 Area: (cm) 0 Volume: (cm) 0 %  Reduction in Area: 100% % Reduction in Volume: 100% Wound Description Classification: Category/Stage II Exudate Amount: None Present Foul Odor After Cleansing: No Slough/Fibrino No Wound Bed Granulation Amount: None Present (0%) Exposed Structure Necrotic Amount: None Present (0%) Fascia Exposed: No Fat Layer (Subcutaneous Tissue) Exposed: No Tendon Exposed: No Muscle Exposed: No Joint Exposed: No Bone Exposed: No Electronic Signature(s) Signed: 11/01/2021 3:32:18 PM By: Donnamarie Poag Entered By: Donnamarie Poag on 11/01/2021 14:45:27 Campillo, [name] ([id number]) -------------------------------------------------------------------------------- [place] Details Patient Name: Breanna Cleverly H. Date of Service: 11/01/2021 2:30 PM Medical Record Number: [id number] Patient Account Number: [id number] Date of Birth/Sex: 13-Oct-1946 (76 y.o. F) Treating RN: Donnamarie Poag Primary Care Tyrhonda Georgiades: Harrel Lemon Other Clinician: Referring Tejasvi Brissett: Harrel Lemon Treating Deronte Solis/Extender: Skipper Cliche in Treatment: 2 Vital Signs Time Taken: 14:38 Temperature (F): 98.3 Height (in): 58 Pulse (bpm): 68 Weight (lbs): 201 Respiratory Rate (breaths/min): 20 Body Mass Index (BMI): 42 Blood Pressure (mmHg): 137/81 Reference Range: 80 - 120 mg / dl Electronic Signature(s) Signed: 11/01/2021 3:32:18 PM By: Donnamarie Poag Entered ByDonnamarie Poag on 11/01/2021 14:40:13

## 2021-11-30 ENCOUNTER — Ambulatory Visit: Payer: Medicare Other | Admitting: Radiation Oncology

## 2022-01-02 DIAGNOSIS — C3412 Malignant neoplasm of upper lobe, left bronchus or lung: Secondary | ICD-10-CM | POA: Insufficient documentation

## 2022-01-02 DIAGNOSIS — I700 Atherosclerosis of aorta: Secondary | ICD-10-CM | POA: Insufficient documentation

## 2022-01-16 ENCOUNTER — Ambulatory Visit
Admission: RE | Admit: 2022-01-16 | Discharge: 2022-01-16 | Disposition: A | Discharge status: Home / Self Care | Payer: Medicare Other | Source: Ambulatory Visit | Attending: Oncology | Admitting: Oncology

## 2022-01-16 ENCOUNTER — Other Ambulatory Visit: Payer: Self-pay

## 2022-01-16 DIAGNOSIS — C3412 Malignant neoplasm of upper lobe, left bronchus or lung: Secondary | ICD-10-CM | POA: Diagnosis present

## 2022-01-18 ENCOUNTER — Encounter: Payer: Self-pay | Admitting: Oncology

## 2022-01-18 ENCOUNTER — Inpatient Hospital Stay: Payer: Medicare Other | Attending: Oncology | Admitting: Oncology

## 2022-01-18 ENCOUNTER — Other Ambulatory Visit: Payer: Self-pay

## 2022-01-18 VITALS — BP 106/65 | HR 72 | Temp 97.9°F | Resp 18

## 2022-01-18 DIAGNOSIS — I10 Essential (primary) hypertension: Secondary | ICD-10-CM | POA: Diagnosis not present

## 2022-01-18 DIAGNOSIS — Z08 Encounter for follow-up examination after completed treatment for malignant neoplasm: Secondary | ICD-10-CM

## 2022-01-18 DIAGNOSIS — Z79899 Other long term (current) drug therapy: Secondary | ICD-10-CM | POA: Diagnosis not present

## 2022-01-18 DIAGNOSIS — R918 Other nonspecific abnormal finding of lung field: Secondary | ICD-10-CM | POA: Insufficient documentation

## 2022-01-18 DIAGNOSIS — E079 Disorder of thyroid, unspecified: Secondary | ICD-10-CM | POA: Insufficient documentation

## 2022-01-18 DIAGNOSIS — C3412 Malignant neoplasm of upper lobe, left bronchus or lung: Secondary | ICD-10-CM | POA: Insufficient documentation

## 2022-01-18 DIAGNOSIS — Z85118 Personal history of other malignant neoplasm of bronchus and lung: Secondary | ICD-10-CM

## 2022-01-18 DIAGNOSIS — M810 Age-related osteoporosis without current pathological fracture: Secondary | ICD-10-CM | POA: Diagnosis not present

## 2022-01-18 DIAGNOSIS — J449 Chronic obstructive pulmonary disease, unspecified: Secondary | ICD-10-CM | POA: Diagnosis not present

## 2022-01-18 DIAGNOSIS — K219 Gastro-esophageal reflux disease without esophagitis: Secondary | ICD-10-CM | POA: Diagnosis not present

## 2022-01-18 DIAGNOSIS — Z923 Personal history of irradiation: Secondary | ICD-10-CM | POA: Diagnosis present

## 2022-01-18 DIAGNOSIS — E039 Hypothyroidism, unspecified: Secondary | ICD-10-CM | POA: Diagnosis not present

## 2022-01-18 NOTE — Progress Notes (Signed)
? ? ? ?Hematology/Oncology Consult note ?[place]  ?[phone] [phone] ? ?Patient Care Team: ?Baxter Hire, MD as PCP - General (Internal Medicine) ?Telford Nab, RN as Sales executive ?Ottie Glazier, MD as Consulting Physician (Pulmonary Disease)  ? ?Name of the patient: [name]  ?[id number]  ?[birth year]  ? ?Date of visit: 01/18/22 ? ?Diagnosis- presumed diagnosis of lung cancer s/p SBRT ? ?Chief complaint/ Reason for visit-routine follow-up of lung cancer ? ?Heme/Onc history: Patient is a 76 year old female with a past medical history significant for hypertension hypothyroidism COPD among other medical problems.  She was admitted to the hospital in March 2022 for worsening anemia and required 2 units of PRBC transfusion.  EGD and colonoscopy was otherwise unremarkable.  She was also started on home oxygen at that time and is currently on 4 L.  She had a CT angio chest PE done during that hospitalization which showed no evidence of PE but a spiculated left upper lobe lung nodule measuring 1.7 x 1.3 cm which was larger as compared to prior size of 1.4 x 0.9 cm 6 months ago.  During her hospitalization her ferritin levels were low at 4 folate and B12 were low as well.  H&H was 8.3/27.6 after blood transfusion.  She has subsequently had her CBC checked on 02/14/2021 when H&H was improved to 11/37.3.  ?  ?Results of anemia work-up including ferritin and iron studies B12 folate myeloma panel haptoglobin and reticulocyte count was unremarkable and patient's hemoglobin had improved to 11.9 from a prior value of 9.7. ?  ?PET scan showed a solitary posterior left upper lobe lung nodule 2 x 1.7 cm in size with an SUV of 7.4 but no other evidence of locoregional adenopathy or distant metastatic disease ? ?Interval history-patient has baseline shortness of breath at rest as well as exertion and on home oxygen which has remained overall stable.  She ambulates  with a walker at home.  She has not had any recent falls. ? ?ECOG PS- 2 ?Pain scale- 0 ? ? ?Review of systems- Review of Systems  ?Constitutional:  Positive for malaise/fatigue. Negative for chills, fever and weight loss.  ?HENT:  Negative for congestion, ear discharge and nosebleeds.   ?Eyes:  Negative for blurred vision.  ?Respiratory:  Positive for shortness of breath. Negative for cough, hemoptysis, sputum production and wheezing.   ?Cardiovascular:  Negative for chest pain, palpitations, orthopnea and claudication.  ?Gastrointestinal:  Negative for abdominal pain, blood in stool, constipation, diarrhea, heartburn, melena, nausea and vomiting.  ?Genitourinary:  Negative for dysuria, flank pain, frequency, hematuria and urgency.  ?Musculoskeletal:  Negative for back pain, joint pain and myalgias.  ?Skin:  Negative for rash.  ?Neurological:  Negative for dizziness, tingling, focal weakness, seizures, weakness and headaches.  ?Endo/Heme/Allergies:  Does not bruise/bleed easily.  ?Psychiatric/Behavioral:  Negative for depression and suicidal ideas. The patient does not have insomnia.    ? ? ? ?Allergies  ?Allergen Reactions  ? Levofloxacin Shortness Of Breath  ? Adhesive [Tape] Other (See Comments)  ? Other Rash  ?  Plastic tape  ? ? ? ?Past Medical History:  ?Diagnosis Date  ? Allergy   ? Asthma   ? Back pain   ? Bronchitis   ? COPD (chronic obstructive pulmonary disease) ([place])   ? Degenerative disc disease, lumbar   ? Depression   ? FHx: cholecystectomy   ? GERD (gastroesophageal reflux disease)   ? H/O: hysterectomy   ? Headache   ?  Hypertension   ? Murmur   ? Osteoporosis   ? Pneumonia 03/31/16  ? being treated by PCP  ? Restless legs syndrome   ? Scoliosis   ? Shingles   ? Thyroid disease   ? ? ? ?Past Surgical History:  ?Procedure Laterality Date  ? ABDOMINAL HYSTERECTOMY    ? APPENDECTOMY    ? BACK SURGERY    ? CHOLECYSTECTOMY    ? COLONOSCOPY WITH PROPOFOL N/A 01/16/2018  ? Procedure: COLONOSCOPY WITH  PROPOFOL;  Surgeon: Toledo, Benay Pike, MD;  Location: [facility] ENDOSCOPY;  Service: Gastroenterology;  Laterality: N/A;  ? COLONOSCOPY WITH PROPOFOL N/A 01/21/2021  ? Procedure: COLONOSCOPY WITH PROPOFOL;  Surgeon: Lin Landsman, MD;  Location: [facility] ENDOSCOPY;  Service: Gastroenterology;  Laterality: N/A;  ? ESOPHAGOGASTRODUODENOSCOPY (EGD) WITH PROPOFOL N/A 01/16/2018  ? Procedure: ESOPHAGOGASTRODUODENOSCOPY (EGD) WITH PROPOFOL;  Surgeon: Toledo, Benay Pike, MD;  Location: [facility] ENDOSCOPY;  Service: Gastroenterology;  Laterality: N/A;  ? ESOPHAGOGASTRODUODENOSCOPY (EGD) WITH PROPOFOL N/A 01/21/2021  ? Procedure: ESOPHAGOGASTRODUODENOSCOPY (EGD) WITH PROPOFOL;  Surgeon: Lin Landsman, MD;  Location: [facility] ENDOSCOPY;  Service: Gastroenterology;  Laterality: N/A;  ? JOINT REPLACEMENT Left 1998  ? shoulder  ? NECK SURGERY Bilateral   ? ROTATOR CUFF REPAIR Right   ? SHOULDER OPEN ROTATOR CUFF REPAIR Right   ? SHOULDER SURGERY Left   ? replacement  ? ? ?Social History  ? ?Socioeconomic History  ? Marital status: Widowed  ?  Spouse name: Not on file  ? Number of children: Not on file  ? Years of education: Not on file  ? Highest education level: Not on file  ?Occupational History  ? Not on file  ?Tobacco Use  ? Smoking status: Former  ?  Packs/day: 1.00  ?  Years: 30.00  ?  Pack years: 30.00  ?  Types: Cigarettes  ?  Quit date: 11/04/2016  ?  Years since quitting: 5.2  ? Smokeless tobacco: Never  ?Vaping Use  ? Vaping Use: Never used  ?Substance and Sexual Activity  ? Alcohol use: No  ?  Alcohol/week: 0.0 standard drinks  ? Drug use: No  ? Sexual activity: Not on file  ?Other Topics Concern  ? Not on file  ?Social History Narrative  ? Not on file  ? ?Social Determinants of Health  ? ?Financial Resource Strain: Not on file  ?Food Insecurity: Not on file  ?Transportation Needs: Not on file  ?Physical Activity: Not on file  ?Stress: Not on file  ?Social Connections: Not on file  ?Intimate Partner Violence: Not on file   ? ? ?Family History  ?Problem Relation Age of Onset  ? Kidney disease Father   ? Heart disease Mother   ? Diabetes Mother   ? ? ? ?Current Outpatient Medications:  ?  albuterol (PROVENTIL HFA;VENTOLIN HFA) 108 (90 Base) MCG/ACT inhaler, Inhale 1-2 puffs into the lungs every 6 (six) hours as needed for wheezing or shortness of breath., Disp: , Rfl:  ?  amLODipine (NORVASC) 5 MG tablet, Take 1 tablet by mouth daily., Disp: , Rfl:  ?  bisacodyl (DULCOLAX) 5 MG EC tablet, Take 1 tablet (5 mg total) by mouth daily as needed for moderate constipation., Disp: 30 tablet, Rfl: 0 ?  Cholecalciferol (VITAMIN D) 2000 UNITS tablet, Take 2,000 Units by mouth daily., Disp: , Rfl:  ?  citalopram (CELEXA) 40 MG tablet, Take 40 mg by mouth daily., Disp: , Rfl:  ?  doxycycline (VIBRAMYCIN) 100 MG capsule, Take 100 mg by  mouth 2 (two) times daily., Disp: , Rfl:  ?  furosemide (LASIX) 20 MG tablet, Take 20 mg by mouth daily., Disp: , Rfl:  ?  gabapentin (NEURONTIN) 100 MG capsule, Take 2 capsules (200 mg total) by mouth at bedtime., Disp: 30 capsule, Rfl: 0 ?  levothyroxine (SYNTHROID, LEVOTHROID) 112 MCG tablet, Take 112 mcg by mouth daily before breakfast., Disp: , Rfl:  ?  metoprolol tartrate (LOPRESSOR) 100 MG tablet, Take 100 mg by mouth 2 (two) times daily., Disp: , Rfl:  ?  Multiple Vitamin (MULTIVITAMIN) tablet, Take 1 tablet by mouth daily. Reported on 04/05/2016, Disp: , Rfl:  ?  nystatin cream (MYCOSTATIN), Apply topically 3 (three) times daily., Disp: , Rfl:  ?  omeprazole (PRILOSEC) 20 MG capsule, Take 20 mg by mouth 2 (two) times daily., Disp: , Rfl:  ?  potassium chloride SA (K-DUR,KLOR-CON) 20 MEQ tablet, Take 20 mEq by mouth daily., Disp: , Rfl:  ?  Fluticasone-Umeclidin-Vilant (TRELEGY ELLIPTA) 100-62.5-25 MCG/ACT AEPB, Inhale 1 puff into the lungs daily. (Patient not taking: Reported on 01/18/2022), Disp: 1 each, Rfl: 0 ?  nystatin (MYCOSTATIN) 100000 UNIT/ML suspension, Take 5 mLs (500,000 Units total) by mouth 4  (four) times daily. (Patient not taking: Reported on 01/18/2022), Disp: 60 mL, Rfl: 0 ?  predniSONE (DELTASONE) 20 MG tablet, Take by mouth. (Patient not taking: Reported on 01/18/2022), Disp: , Rfl:  ? ?Physical exam:  ?V

## 2022-01-18 NOTE — Progress Notes (Signed)
Pt states she has been experiencing headaches and takes tylenol which makes it tolerable, tends to get worse before she goes to bed at night.  ?

## 2022-03-28 DIAGNOSIS — R0602 Shortness of breath: Secondary | ICD-10-CM | POA: Insufficient documentation

## 2022-04-21 ENCOUNTER — Ambulatory Visit
Admission: RE | Admit: 2022-04-21 | Discharge: 2022-04-21 | Disposition: A | Discharge status: Home / Self Care | Payer: Medicare Other | Source: Ambulatory Visit | Attending: Oncology | Admitting: Oncology

## 2022-04-21 DIAGNOSIS — Z08 Encounter for follow-up examination after completed treatment for malignant neoplasm: Secondary | ICD-10-CM | POA: Diagnosis present

## 2022-04-21 DIAGNOSIS — Z85118 Personal history of other malignant neoplasm of bronchus and lung: Secondary | ICD-10-CM | POA: Insufficient documentation

## 2022-04-24 ENCOUNTER — Inpatient Hospital Stay: Payer: Medicare Other | Admitting: Oncology

## 2022-05-18 IMAGING — CR DG CHEST 2V
1 series · 2 of 2 positions shown · non-contrast
Comparison: March 19, 2020 chest radiograph; April 29, 2019 chest CT.

CLINICAL DATA: Chest pain

EXAM:
CHEST - 2 VIEW

[Series 1: dg chest 2 view · 0.14mm/px · 2 of 2 slices shown]
[im 1/2]
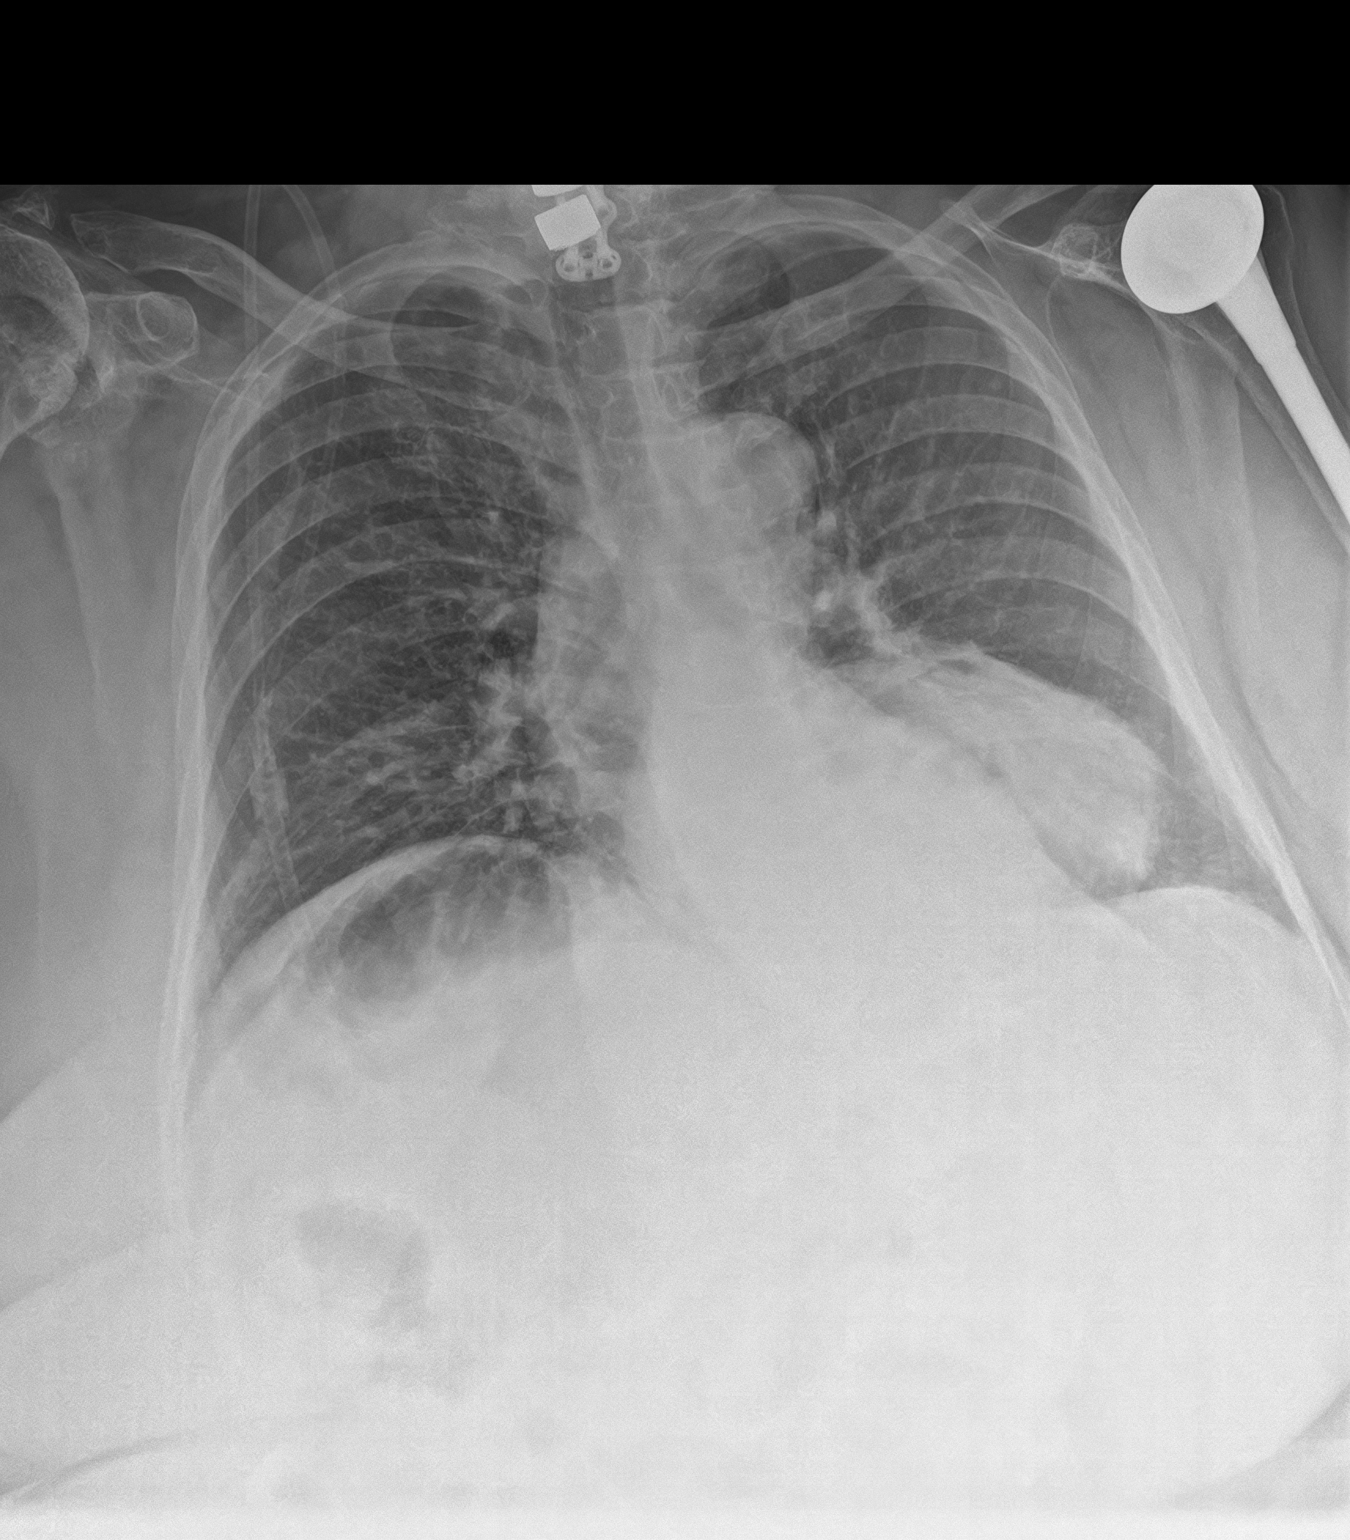
[im 2/2]
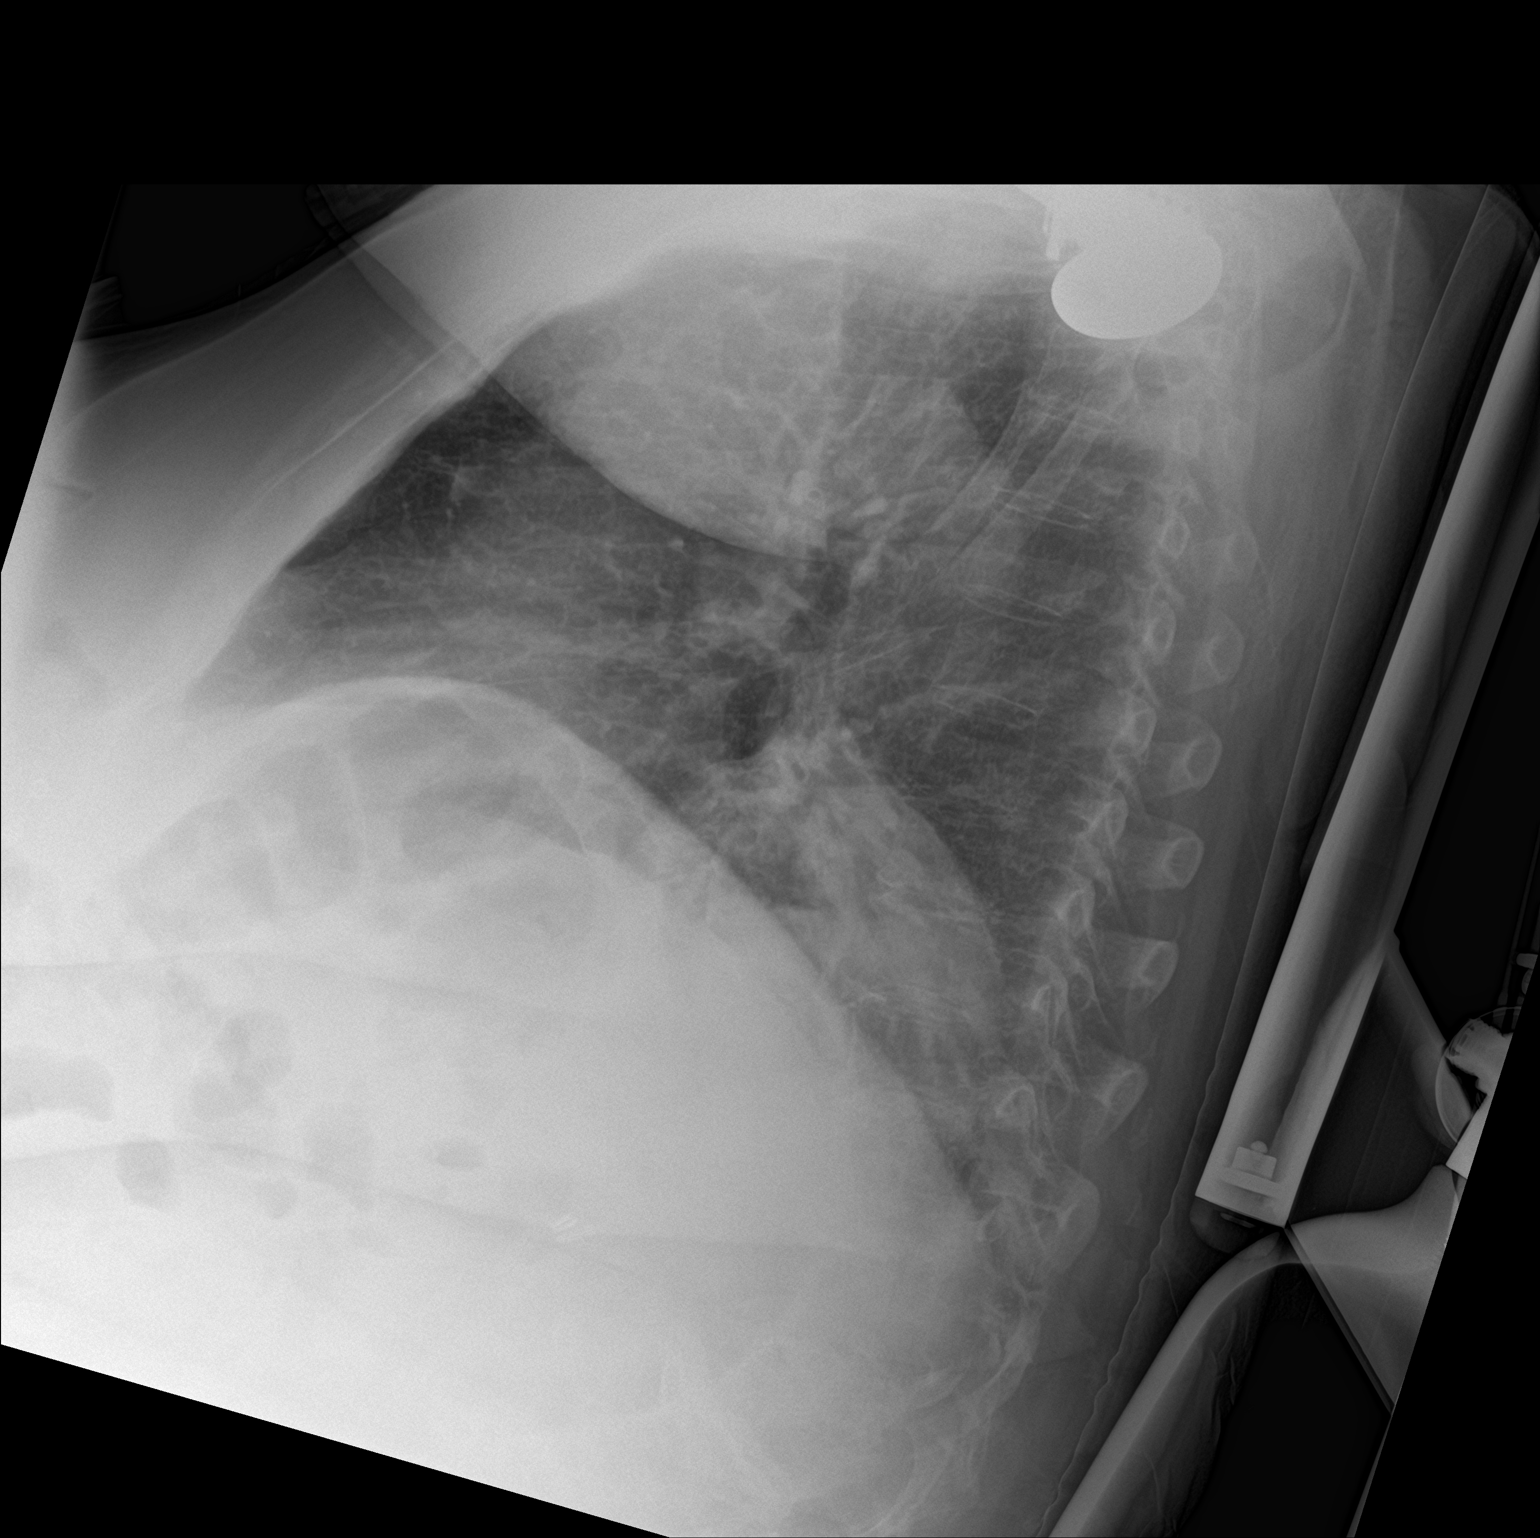

[2 of 2 positions shown; findings below may reference images not displayed]

FINDINGS: There is a sizable hiatal type hernia. Opacity on the left may
reside within the hiatal hernia. A degree of overlying opacity in
the left lower lobe cannot be excluded. Lungs elsewhere clear. Heart
size and pulmonary vascular normal. No adenopathy. There is lower
thoracic and lumbar levoscoliosis. There is postoperative change in
the lower cervical spine and left shoulder regions. There is
arthropathy in the right shoulder.
IMPRESSION: Large hiatal hernia. Question opacity within the hernia on the left
versus left lower lobe infiltrate. Advise noncontrast chest CT to
further evaluate the left lower lobe region given this circumstance.
Lungs elsewhere clear. Heart size normal. No adenopathy evident.

## 2022-05-19 ENCOUNTER — Inpatient Hospital Stay: Payer: Medicare Other | Attending: Oncology | Admitting: Oncology

## 2022-05-19 DIAGNOSIS — G2581 Restless legs syndrome: Secondary | ICD-10-CM | POA: Diagnosis not present

## 2022-05-19 DIAGNOSIS — E079 Disorder of thyroid, unspecified: Secondary | ICD-10-CM | POA: Diagnosis not present

## 2022-05-19 DIAGNOSIS — J449 Chronic obstructive pulmonary disease, unspecified: Secondary | ICD-10-CM | POA: Insufficient documentation

## 2022-05-19 DIAGNOSIS — K219 Gastro-esophageal reflux disease without esophagitis: Secondary | ICD-10-CM | POA: Diagnosis not present

## 2022-05-19 DIAGNOSIS — D509 Iron deficiency anemia, unspecified: Secondary | ICD-10-CM | POA: Diagnosis present

## 2022-05-19 DIAGNOSIS — Z87891 Personal history of nicotine dependence: Secondary | ICD-10-CM | POA: Diagnosis not present

## 2022-05-19 DIAGNOSIS — R911 Solitary pulmonary nodule: Secondary | ICD-10-CM | POA: Insufficient documentation

## 2022-05-19 DIAGNOSIS — Z08 Encounter for follow-up examination after completed treatment for malignant neoplasm: Secondary | ICD-10-CM | POA: Diagnosis not present

## 2022-05-19 DIAGNOSIS — Z85118 Personal history of other malignant neoplasm of bronchus and lung: Secondary | ICD-10-CM

## 2022-05-19 DIAGNOSIS — Z7952 Long term (current) use of systemic steroids: Secondary | ICD-10-CM | POA: Insufficient documentation

## 2022-05-19 DIAGNOSIS — Z923 Personal history of irradiation: Secondary | ICD-10-CM | POA: Diagnosis not present

## 2022-05-19 DIAGNOSIS — Z79899 Other long term (current) drug therapy: Secondary | ICD-10-CM | POA: Insufficient documentation

## 2022-05-19 DIAGNOSIS — M810 Age-related osteoporosis without current pathological fracture: Secondary | ICD-10-CM | POA: Diagnosis not present

## 2022-05-19 DIAGNOSIS — Z79890 Hormone replacement therapy (postmenopausal): Secondary | ICD-10-CM | POA: Diagnosis not present

## 2022-05-19 IMAGING — CT CT ANGIO CHEST
2 of 6 series · 16 of 46 positions shown · IV contrast (APPLIED)
Comparison: April 29, 2019

CLINICAL DATA: Chest pain.

EXAM:
CT ANGIOGRAPHY CHEST WITH CONTRAST
TECHNIQUE: Multidetector CT imaging of the chest was performed using the
standard protocol during bolus administration of intravenous
contrast. Multiplanar CT image reconstructions and MIPs were
obtained to evaluate the vascular anatomy.
CONTRAST:  100mL OMNIPAQUE IOHEXOL 350 MG/ML SOLN

[Series 5: thins · axial · 0.72mm/px · z∈[-820,-600]mm · 13 of 242 slices shown]
[im 11/242  lung]
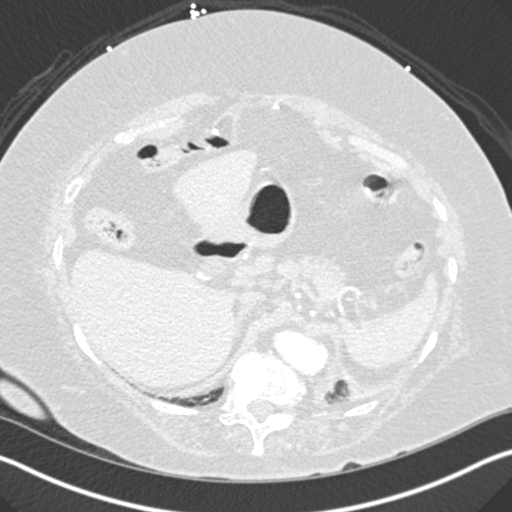
[im 32/242  soft-tissue]
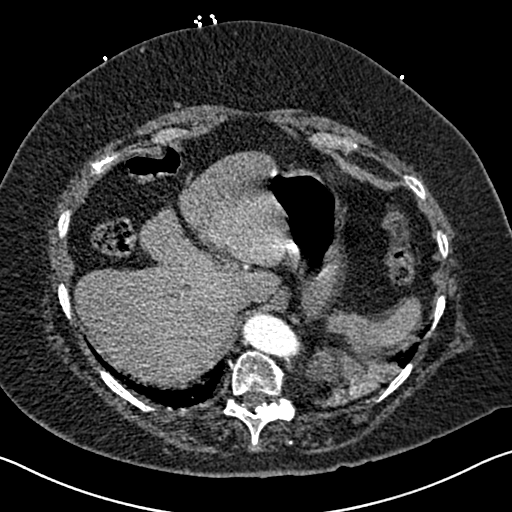
[im 53/242  lung]
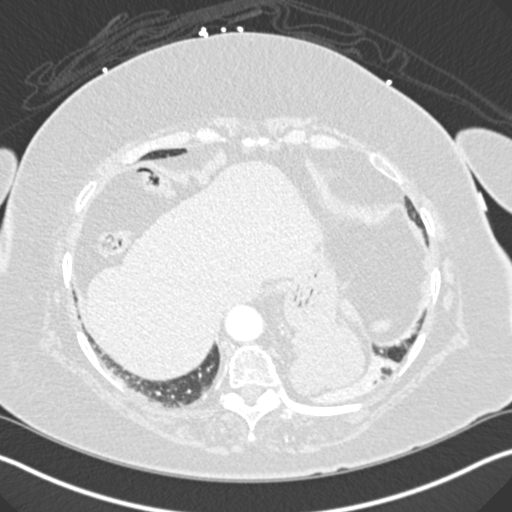
[im 63/242  soft-tissue]
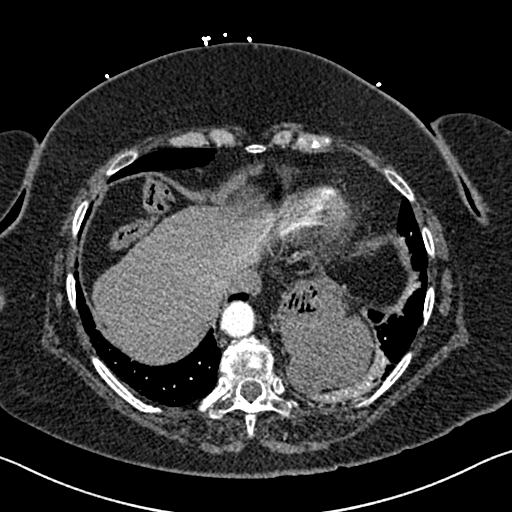
[im 84/242  lung]
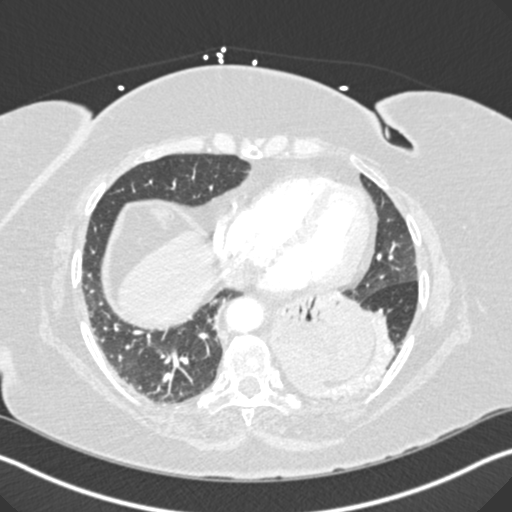
[im 105/242  soft-tissue]
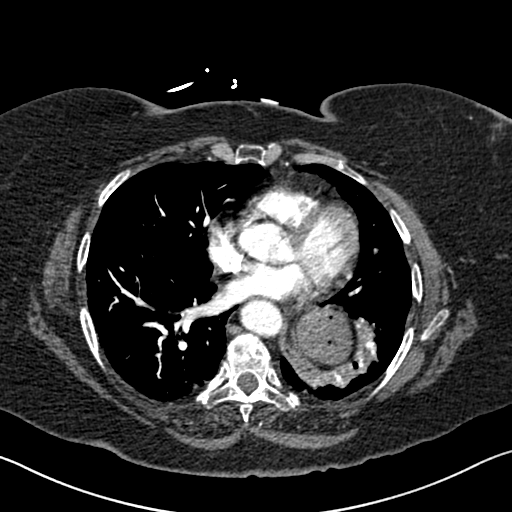
[im 126/242  lung]
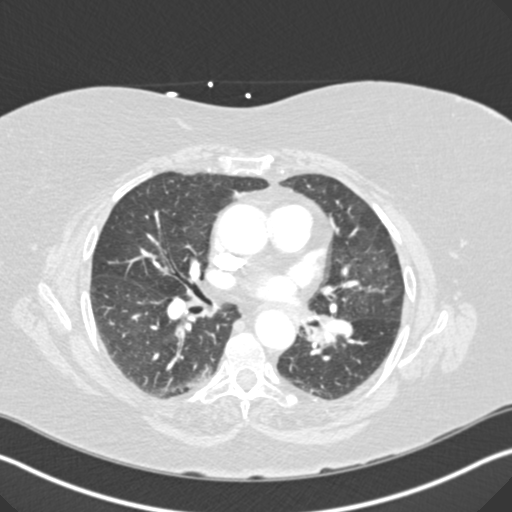
[im 137/242  soft-tissue]
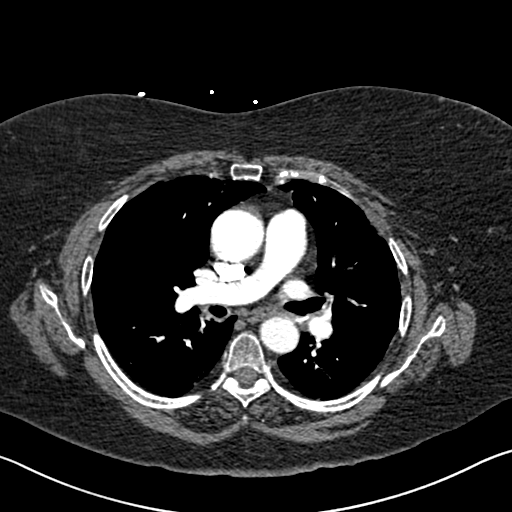
[im 158/242  lung]
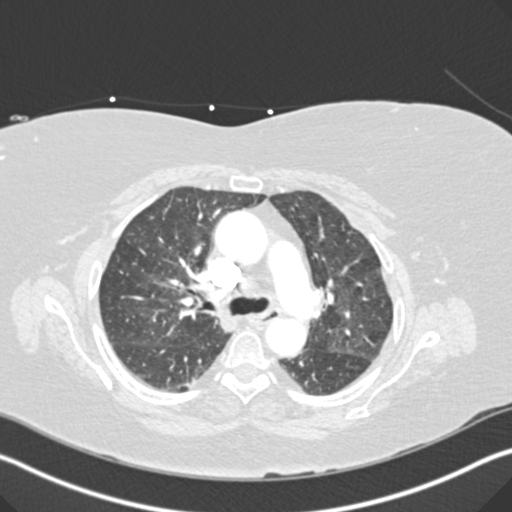
[im 179/242  soft-tissue]
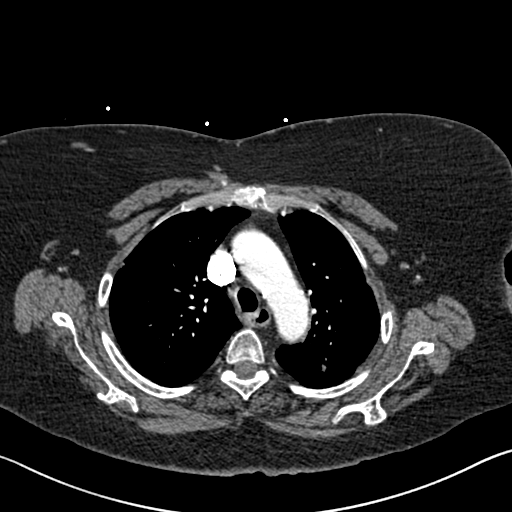
[im 189/242  lung]
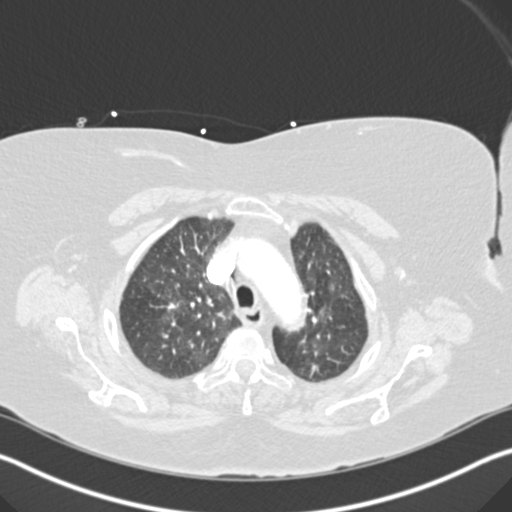
[im 210/242  soft-tissue]
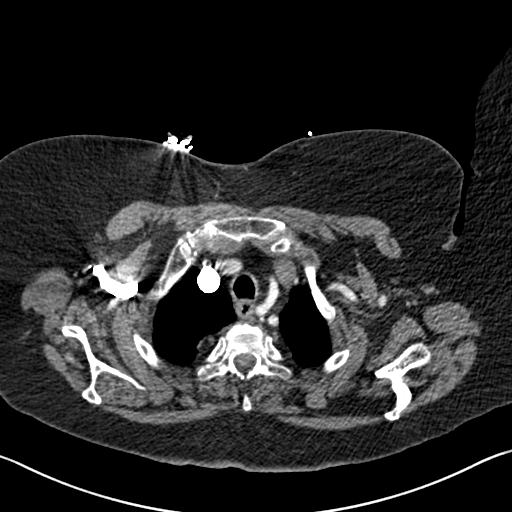
[im 231/242  lung]
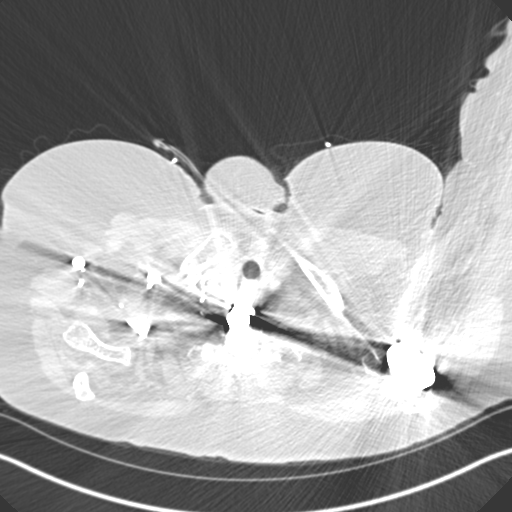

[Series 7: coronal mpr · coronal · 0.48mm/px · 3 of 102 slices shown]
[im 26/102  soft-tissue]
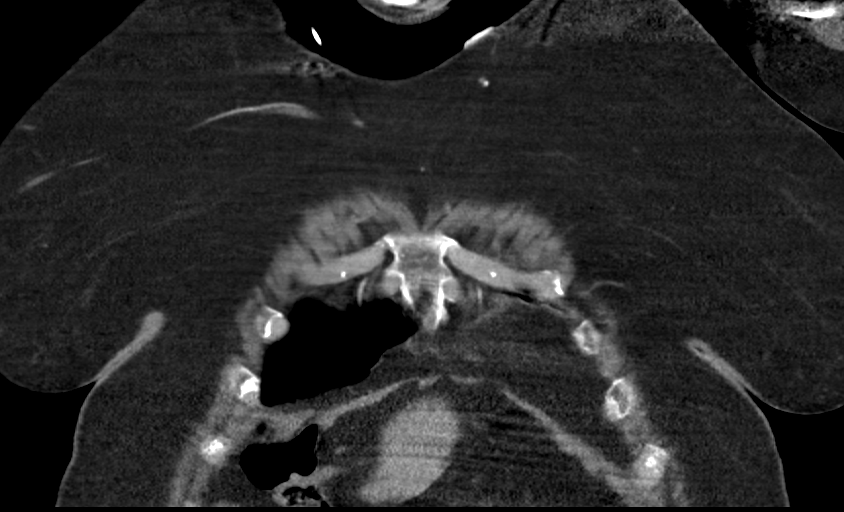
[im 51/102  soft-tissue]
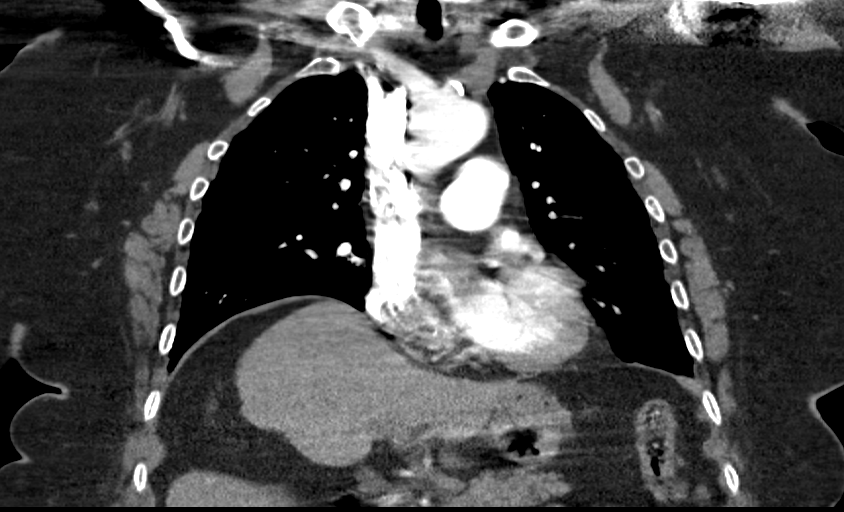
[im 76/102  soft-tissue]
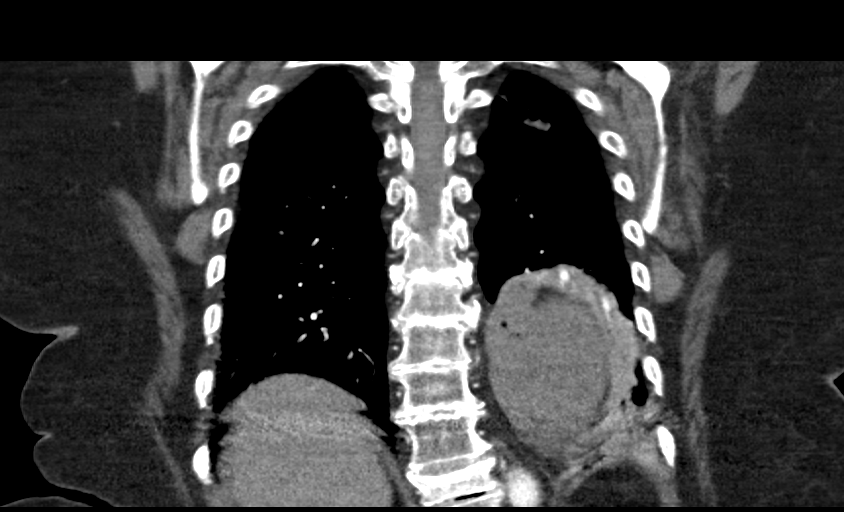

[16 of 46 positions shown; findings below may reference images not displayed]

FINDINGS: Cardiovascular: There is moderate severity calcification of the
aortic arch. Satisfactory opacification of the pulmonary arteries to
the segmental level. No evidence of pulmonary embolism. Normal heart
size. No pericardial effusion.

Mediastinum/Nodes:

Lungs/Pleura: A 1.4 cm x 0.9 cm x 0.5 cm focal noncalcified area of
low attenuation is seen within the posterior aspect of the left
upper lobe. This is increased in size when compared to the prior
study (seen as an area of focal inflammatory changes on axial CT
image 35, CT series number 3 of the prior exam).

Mild to moderate severity atelectasis is seen within the
posteromedial aspect of the left lung base. This area is seen
adjacent to the posterior and lateral aspects of a large gastric
hernia and is mildly increased in severity when compared to the
prior study.

There is no evidence of a pleural effusion or pneumothorax.

Upper Abdomen: There is a large gastric hernia.

Multiple surgical clips are seen within the gallbladder fossa.

Musculoskeletal:

A nondisplaced fracture deformity of the anterior third left rib is
seen. While this is of indeterminate age. This represents a new
finding when compared to the prior study.

A metallic density fusion plate and screws are seen along the
anterior aspect of the lower cervical spine.

An intact left shoulder replacement is seen. Associated streak
artifact is noted with subsequently limited evaluation of the
adjacent osseous and soft tissue structures.

A chronic compression fracture deformity of the T12 vertebral body
is seen.

Multilevel degenerative changes seen throughout the thoracic spine.

Review of the MIP images confirms the above findings.
IMPRESSION: 1. No evidence of pulmonary embolism.
2. Area of low attenuation within the posterior aspect of the left
upper lobe, which is increased in size when compared to the prior
study dated April 29, 2019 and is concerning for the presence of an
underlying neoplastic process. Correlation with PET-CT is
recommended.
3. Large gastric hernia.
4. Nondisplaced fracture deformity of the anterior third left rib of
indeterminate age. This represents a new finding when compared to
the prior study. Correlation with physical examination is
recommended to determine the presence or absence of point
tenderness.
5. Chronic compression fracture deformity of the T12 vertebral body.
6. Intact left shoulder replacement.
7. Aortic atherosclerosis.

Aortic Atherosclerosis ([id number]).

## 2022-05-19 NOTE — Progress Notes (Signed)
Pt states she experiences numbness on the rt side of her jaw and neck here and there. Pt son passed away on 13-May-2023.

## 2022-05-19 NOTE — Progress Notes (Signed)
Hematology/Oncology Consult note [facility]  [phone] [phone]  Patient Care Team: Baxter Hire, MD as PCP - General (Internal Medicine) Telford Nab, RN as Oncology Nurse Navigator Ottie Glazier, MD as Consulting Physician (Pulmonary Disease)   Name of the patient: [name]  [id number]  1945-12-18   Date of visit: 05/19/22  Diagnosis- presumed diagnosis of lung cancer s/p SBRT  Chief complaint/ Reason for visit-discuss CT scan results and further management  Heme/Onc history: Patient is a 76 year old female who was seen previously for iron deficiency anemia.  She was then found to have a solitary left upper lobe lung nodule 2 x 1.7 cm with an SUV of 7.4 but no other evidence of locoregional or metastatic disease.Patient was seen by radiation oncology and received empiric SBRT to the nodule given her baseline oxygen dependent COPD in September 2022.  She has been in remission since then and being followed up with surveillance scans.  Interval history-patient is grieving the death of her son who passed away from cancer.  He now only has 1 living child out of 4.  She lives alone and her daughter lives about 2 miles away.  ECOG PS- 2 Pain scale- 0   Review of systems- Review of Systems  Constitutional:  Positive for malaise/fatigue. Negative for chills, fever and weight loss.  HENT:  Negative for congestion, ear discharge and nosebleeds.   Eyes:  Negative for blurred vision.  Respiratory:  Positive for shortness of breath. Negative for cough, hemoptysis, sputum production and wheezing.   Cardiovascular:  Negative for chest pain, palpitations, orthopnea and claudication.  Gastrointestinal:  Negative for abdominal pain, blood in stool, constipation, diarrhea, heartburn, melena, nausea and vomiting.  Genitourinary:  Negative for dysuria, flank pain, frequency, hematuria and urgency.  Musculoskeletal:  Negative for back pain,  joint pain and myalgias.  Skin:  Negative for rash.  Neurological:  Negative for dizziness, tingling, focal weakness, seizures, weakness and headaches.  Endo/Heme/Allergies:  Does not bruise/bleed easily.  Psychiatric/Behavioral:  Negative for depression and suicidal ideas. The patient does not have insomnia.       Allergies  Allergen Reactions   Levofloxacin Shortness Of Breath   Adhesive [Tape] Other (See Comments)   Other Rash    Plastic tape     Past Medical History:  Diagnosis Date   Allergy    Asthma    Back pain    Bronchitis    COPD (chronic obstructive pulmonary disease) (HCC)    Degenerative disc disease, lumbar    Depression    FHx: cholecystectomy    GERD (gastroesophageal reflux disease)    H/O: hysterectomy    Headache    Hypertension    Murmur    Osteoporosis    Pneumonia 03/31/16   being treated by PCP   Restless legs syndrome    Scoliosis    Shingles    Thyroid disease      Past Surgical History:  Procedure Laterality Date   ABDOMINAL HYSTERECTOMY     APPENDECTOMY     BACK SURGERY     CHOLECYSTECTOMY     COLONOSCOPY WITH PROPOFOL N/A 01/16/2018   Procedure: COLONOSCOPY WITH PROPOFOL;  Surgeon: Toledo, Benay Pike, MD;  Location: [facility] ENDOSCOPY;  Service: Gastroenterology;  Laterality: N/A;   COLONOSCOPY WITH PROPOFOL N/A 01/21/2021   Procedure: COLONOSCOPY WITH PROPOFOL;  Surgeon: Lin Landsman, MD;  Location: [facility] ENDOSCOPY;  Service: Gastroenterology;  Laterality: N/A;   ESOPHAGOGASTRODUODENOSCOPY (EGD) WITH PROPOFOL N/A  01/16/2018   Procedure: ESOPHAGOGASTRODUODENOSCOPY (EGD) WITH PROPOFOL;  Surgeon: Toledo, Benay Pike, MD;  Location: [facility] ENDOSCOPY;  Service: Gastroenterology;  Laterality: N/A;   ESOPHAGOGASTRODUODENOSCOPY (EGD) WITH PROPOFOL N/A 01/21/2021   Procedure: ESOPHAGOGASTRODUODENOSCOPY (EGD) WITH PROPOFOL;  Surgeon: Lin Landsman, MD;  Location: [facility] END[facility]COPY;  Service: Gastroenterology;  Laterality: N/A;   JOINT  REPLACEMENT Left 1998   shoulder   NECK SURGERY Bilateral    ROTATOR CUFF REPAIR Right    SHOULDER OPEN ROTATOR CUFF REPAIR Right    SHOULDER SURGERY Left    replacement    Social History   Socioeconomic History   Marital status: Widowed    Spouse name: Not on file   Number of children: Not on file   Years of education: Not on file   Highest education level: Not on file  Occupational History   Not on file  Tobacco Use   Smoking status: Former    Packs/day: 1.00    Years: 30.00    Total pack years: 30.00    Types: Cigarettes    Quit date: 11/04/2016    Years since quitting: 5.5   Smokeless tobacco: Never  Vaping Use   Vaping Use: Never used  Substance and Sexual Activity   Alcohol use: No    Alcohol/week: 0.0 standard drinks of alcohol   Drug use: No   Sexual activity: Not on file  Other Topics Concern   Not on file  Social History Narrative   Not on file   Social Determinants of Health   Financial Resource Strain: Not on file  Food Insecurity: Not on file  Transportation Needs: Not on file  Physical Activity: Not on file  Stress: Not on file  Social Connections: Not on file  Intimate Partner Violence: Not on file    Family History  Problem Relation Age of Onset   Kidney disease Father    Heart disease Mother    Diabetes Mother      Current Outpatient Medications:    albuterol (PROVENTIL HFA;VENTOLIN HFA) 108 (90 Base) MCG/ACT inhaler, Inhale 1-2 puffs into the lungs every 6 (six) hours as needed for wheezing or shortness of breath., Disp: , Rfl:    amLODipine (NORVASC) 5 MG tablet, Take 1 tablet by mouth daily., Disp: , Rfl:    azithromycin (ZITHROMAX) 250 MG tablet, Take by mouth., Disp: , Rfl:    bisacodyl (DULCOLAX) 5 MG EC tablet, Take 1 tablet (5 mg total) by mouth daily as needed for moderate constipation., Disp: 30 tablet, Rfl: 0   Cholecalciferol (VITAMIN D) 2000 UNITS tablet, Take 2,000 Units by mouth daily., Disp: , Rfl:    citalopram (CELEXA)  40 MG tablet, Take 40 mg by mouth daily., Disp: , Rfl:    doxycycline (VIBRAMYCIN) 100 MG capsule, Take 100 mg by mouth 2 (two) times daily., Disp: , Rfl:    fluconazole (DIFLUCAN) 150 MG tablet, SMARTSIG:1 Tablet(s) By Mouth 1-2 Times Daily, Disp: , Rfl:    furosemide (LASIX) 20 MG tablet, Take 20 mg by mouth daily., Disp: , Rfl:    gabapentin (NEURONTIN) 100 MG capsule, Take 2 capsules (200 mg total) by mouth at bedtime., Disp: 30 capsule, Rfl: 0   levothyroxine (SYNTHROID, LEVOTHROID) 112 MCG tablet, Take 112 mcg by mouth daily before breakfast., Disp: , Rfl:    metoprolol tartrate (LOPRESSOR) 100 MG tablet, Take 100 mg by mouth 2 (two) times daily., Disp: , Rfl:    Multiple Vitamin (MULTIVITAMIN) tablet, Take 1 tablet by mouth daily. Reported  on 04/05/2016, Disp: , Rfl:    nystatin cream (MYCOSTATIN), Apply topically 3 (three) times daily., Disp: , Rfl:    omeprazole (PRILOSEC) 20 MG capsule, Take 20 mg by mouth 2 (two) times daily., Disp: , Rfl:    potassium chloride SA (K-DUR,KLOR-CON) 20 MEQ tablet, Take 20 mEq by mouth daily., Disp: , Rfl:    terconazole (TERAZOL 3) 0.8 % vaginal cream, SMARTSIG:1 Application Vaginal Every Night, Disp: , Rfl:    ALPRAZolam (XANAX) 0.25 MG tablet, Take by mouth. (Patient not taking: Reported on 05/19/2022), Disp: , Rfl:    Fluticasone-Umeclidin-Vilant (TRELEGY ELLIPTA) 100-62.5-25 MCG/ACT AEPB, Inhale 1 puff into the lungs daily. (Patient not taking: Reported on 01/18/2022), Disp: 1 each, Rfl: 0   nystatin (MYCOSTATIN) 100000 UNIT/ML suspension, Take 5 mLs (500,000 Units total) by mouth 4 (four) times daily. (Patient not taking: Reported on 01/18/2022), Disp: 60 mL, Rfl: 0   predniSONE (DELTASONE) 20 MG tablet, Take by mouth. (Patient not taking: Reported on 01/18/2022), Disp: , Rfl:   Physical exam:  Physical Exam Constitutional:      General: She is not in acute distress. Cardiovascular:     Rate and Rhythm: Normal rate and regular rhythm.     Heart sounds:  Normal heart sounds.  Pulmonary:     Effort: Pulmonary effort is normal.     Breath sounds: Normal breath sounds.  Abdominal:     General: Bowel sounds are normal.     Palpations: Abdomen is soft.  Skin:    General: Skin is warm and dry.  Neurological:     Mental Status: She is alert and oriented to person, place, and time.         Latest Ref Rng & Units 10/28/2021    4:31 AM  CMP  Glucose 70 - 99 mg/dL 115   BUN 8 - 23 mg/dL 14   Creatinine 0.44 - 1.00 mg/dL 0.53   Sodium 135 - 145 mmol/L 134   Potassium 3.5 - 5.1 mmol/L 4.2   Chloride 98 - 111 mmol/L 94   CO2 22 - 32 mmol/L 32   Calcium 8.9 - 10.3 mg/dL 8.9       Latest Ref Rng & Units 10/28/2021    4:31 AM  CBC  WBC 4.0 - 10.5 K/uL 13.8   Hemoglobin 12.0 - 15.0 g/dL 10.3   Hematocrit 36.0 - 46.0 % 32.6   Platelets 150 - 400 K/uL 231     No images are attached to the encounter.  CT Chest Wo Contrast  Result Date: 04/22/2022 CLINICAL DATA:  Left upper lobe lung nodule. History of left upper lobe lung cancer. * Tracking Code: BO * EXAM: CT CHEST WITHOUT CONTRAST TECHNIQUE: Multidetector CT imaging of the chest was performed following the standard protocol without IV contrast. RADIATION DOSE REDUCTION: This exam was performed according to the departmental dose-optimization program which includes automated exposure control, adjustment of the mA and/or kV according to patient size and/or use of iterative reconstruction technique. COMPARISON:  01/16/2022 FINDINGS: Cardiovascular: The heart size is normal. No substantial pericardial effusion. Coronary artery calcification is evident. Mild atherosclerotic calcification is noted in the wall of the thoracic aorta. Mediastinum/Nodes: No mediastinal lymphadenopathy. No evidence for gross hilar lymphadenopathy although assessment is limited by the lack of intravenous contrast on the current study. The esophagus has normal imaging features. Large hiatal/paraesophageal hernia again noted.  Lungs/Pleura: Similar appearance of the 1.6 x 0.9 cm posterior left upper lobe pulmonary nodule measured in a similar fashion  as on the prior study when it measured 1.6 x 1.2 cm. The tiny 4 mm left upper lobe pulmonary nodule identified previously is somewhat motion obscured today but similar on image 21/3. 2 mm left upper lobe pulmonary nodule seen previously is not readily evident today. Chronic atelectasis/scarring in the left base is similar to prior. Stable 3 mm right apical nodule on 15/3. No focal airspace consolidation. There is no evidence of pleural effusion. Upper Abdomen: Unremarkable. Musculoskeletal: No worrisome lytic or sclerotic osseous abnormality. Degenerative changes noted right shoulder. Cervical fusion hardware evident. Patient is status post left shoulder replacement. T11 compression deformity is similar to prior. IMPRESSION: 1. 1.6 x 0.9 cm posterior left upper lobe pulmonary nodule measures slightly smaller today. 2. 4 mm left upper lobe nodule identified as new on the previous study is stable in the interval. An additional 2 mm left upper lobe nodule seen previously is not apparent today. No new suspicious pulmonary nodule or mass on today's exam. 3. Large hiatal/paraesophageal hernia. 4. Aortic Atherosclerosis (ICD10-I70.0). Electronically Signed   By: Misty Stanley M.D.   On: 04/22/2022 13:56     Assessment and plan- Patient is a 76 y.o. female history of stage I lung cancer presumed without biopsy given underlying COPD s/p SBRT to left upper lobeHere for routine follow-up  CT chest without contrast shows stable size of the left upper lobe lung nodule postradiation.  She was noted to have 2 other large nodules in the upper lobe as well 1 of which is stable and the other one is no longer visualized.  Plan is to continue surveillance with a repeat CT scan in 6 months and see her thereafter.  History of iron deficiency anemia most recent hemoglobin from May 2023 was overall stable at  11.3.  Continue to monitor   Visit Diagnosis 1. Encounter for follow-up surveillance of lung cancer      Dr. Randa Evens, MD, MPH [facility] [id number] 05/19/2022 10:52 AM

## 2022-05-22 NOTE — Progress Notes (Signed)
 Established Patient Visit   Chief Complaint: Chief Complaint  Patient presents with  . Shortness of Breath  . Hypertension    FU Myoview    Date of Service: 05/22/2022 Date of Birth: 10-25-1946 PCP: Rudolpho Norleen Lenis, MD  History of Present Illness: [name] is a 76 y.o.female patient who returns for evaluation of chest pain and shortness of breath.  The patient has a history of essential hypertension, COPD, and type 2 diabetes.  She was diagnosed with upper lobe lung cancer 06/2021, and completed radiation therapy.  Recent surveillance CT 01/16/2022 revealed decrease in size of original left upper lobe nodule, new 2 and 4 cm left upper lobe nodules, and resolution of anterior left upper lobe nodule, with plan for repeat CT scan in 3 months.  The patient was initially seen 03/28/2022 at which time she reported new onset chest pain x 3 weeks.  She describes left-sided chest pain, sharp-like in sensation, which comes and goes with and without exertion.  Episodes typically last 10 to 15 minutes.  She has chronic exertional dyspnea due to underlying COPD on supplemental oxygen.  She reports occasional palpitations.  Has chronic pedal edema left greater than right.  The patient's not active and does not exercise regularly.  ECG revealed sinus rhythm, left axis deviation, with pulmonary disease pattern.  The patient returns today, reports doing okay.  She reports very little chest pain.  She has chronic exertional dyspnea due to underlying COPD.  She reports occasional palpitations.  She has mild pedal edema.  Lexiscan Myoview  was performed 04/06/2022.  Gated scintigraphy revealed LV ejection fraction at 33%.  SPECT analysis revealed stress apical lateral wall defect however, we were unable to obtain rest images due to an inability to raise her arm above her head.  Of note, repeat chest CT 04/21/2022 revealed 1.6 cm left upper lobe nodule which appeared smaller, a 2.4 cm left upper node nodule which appeared  unchanged, and a 2.0 left upper lobe nodule which appears to have resolved.   The patient has essential hypertension, blood pressure well controlled, on amlodipine , metoprolol  tartrate and furosemide , which are well-tolerated without apparent side effects.  The patient follows a low-sodium, no added salt diet.   The patient has diet-controlled type 2 diabetes.  The patient follows a low carbohydrate, ADA diet.    Past Medical and Surgical History  Past Medical History Past Medical History:  Diagnosis Date  . Arthritis   . Asthma without status asthmaticus   . Carpal tunnel syndrome   . COPD (chronic obstructive pulmonary disease) (CMS-HCC)   . Current tobacco use 11/24/2014  . Enthesopathy of hip region   . Headache(784.0)   . Hypertension   . Lumbago   . Neuralgia, neuritis, and radiculitis, unspecified   . Osteoporosis, post-menopausal   . Other and unspecified angina pectoris (CMS-HCC)   . Unspecified hypothyroidism     Past Surgical History She has a past surgical history that includes Hysterectomy (Bilateral, 1972); Cholecystectomy; Joint replacement (Right); Joint replacement (Right, 08/2009); Anterior fusion cervical spine; Appendectomy; Hernia repair; Colonoscopy (01/16/2018); and egd (01/16/2018).   Medications and Allergies  Current Medications  Current Outpatient Medications  Medication Sig Dispense Refill  . albuterol  (VENTOLIN  HFA) 90 mcg/actuation inhaler Inhale 2 inhalations into the lungs every 6 (six) hours as needed for Wheezing 3 each 3  . ALPRAZolam (XANAX) 0.25 MG tablet Take 1 tablet (0.25 mg total) by mouth 2 (two) times daily as needed for Anxiety for up to 30 days  30 tablet 0  . amLODIPine  (NORVASC ) 5 MG tablet Take 1 tablet by mouth once daily 90 tablet 0  . cholecalciferol  (VITAMIN D3) 2,000 unit tablet Take 2,000 Units by mouth once daily.    . citalopram  (CELEXA ) 40 MG tablet Take 1 tablet by mouth once daily 90 tablet 0  .  fluticasone -umeclidinium-vilanterol (TRELEGY ELLIPTA ) 100-62.5-25 mcg inhaler Inhale 1 inhalation into the lungs once daily 60 each 6  . FUROsemide  (LASIX ) 20 MG tablet Take 1 tablet by mouth once daily 90 tablet 1  . gabapentin  (NEURONTIN ) 100 MG capsule TAKE 2 CAPSULES BY MOUTH NIGHTLY 60 capsule 0  . KLOR-CON  M20 20 mEq ER tablet Take 1 tablet by mouth once daily 90 tablet 0  . levothyroxine  (SYNTHROID ) 112 MCG tablet TAKE 1 TABLET BY MOUTH IN THE MORNING 30 TO 60 MINUTES BEFORE BREAKFAST ON AN EMPTY STOMACH WITH A GLASS OF WATER 90 tablet 0  . meloxicam (MOBIC) 15 MG tablet Take 1 tablet (15 mg total) by mouth once daily 30 tablet 2  . metoprolol  tartrate (LOPRESSOR ) 100 MG tablet Take 1 tablet by mouth twice daily 180 tablet 0  . multivitamin tablet Take 1 tablet by mouth once daily.    . nystatin  (MYCOSTATIN ) 100,000 unit/gram cream APPLY  CREAM TOPICALLY THREE TIMES DAILY 30 g 0  . omeprazole (PRILOSEC) 20 MG DR capsule TAKE 1 CAPSULE BY MOUTH TWICE DAILY 30 MINUTES PRIOR TO MEALS *MUST HAVE OFFICE VISIT FOR FURTHER REFILLS* 180 capsule 1  . predniSONE  (DELTASONE ) 10 MG tablet Taper [id number] 21 tablet 0  . sennosides-docusate (SENOKOT-S) 8.6-50 mg tablet Take 1 tablet by mouth once daily       . fluticasone  propion-salmeteroL (ADVAIR DISKUS) 250-50 mcg/dose diskus inhaler Inhale 1 inhalation into the lungs every 12 (twelve) hours 60 each 12  . traZODone  (DESYREL ) 100 MG tablet Take 1 tablet (100 mg total) by mouth nightly for 30 days 30 tablet 11   No current facility-administered medications for this visit.    Allergies: Levaquin  [levofloxacin ] and Other  Social and Family History  Social History  reports that she quit smoking about 5 years ago. Her smoking use included cigarettes. She has a 30.00 pack-year smoking history. She has never used smokeless tobacco. She reports that she does not drink alcohol and does not use drugs.  Family History Family History  Problem Relation Age of  Onset  . Asthma Mother   . High blood pressure (Hypertension) Mother   . Osteoporosis (Thinning of bones) Mother   . Diabetes type II Mother   . Myocardial Infarction (Heart attack) Mother   . Arthritis Mother        rheumatoid  . Stroke Mother   . High blood pressure (Hypertension) Father   . Myocardial Infarction (Heart attack) Father   . Stroke Father   . Arthritis Sister        rheumatoid  . High blood pressure (Hypertension) Brother     Review of Systems   Review of Systems: The patient reports infrequent chest pain, with chronic exertional shortness of breath, without orthopnea, paroxysmal nocturnal dyspnea, mild pedal edema,  occasional palpitations, heart racing, without presyncope, syncope. Review of 12 Systems is negative except as described above.  Physical Examination   Vitals:BP 116/70   Pulse 86   Ht 147.3 cm (4' 10)   Wt 87.1 kg (192 lb)   SpO2 96%   BMI 40.13 kg/m  Ht:147.3 cm (4' 10) Wt:87.1 kg (192 lb) ADJ:Anib surface area is  1.89 meters squared. Body mass index is 40.13 kg/m.  HEENT: Pupils equally reactive to light and accomodation  Neck: Supple without thyromegaly, carotid pulses 2+ Lungs: clear to auscultation bilaterally; no wheezes, rales, rhonchi Heart: Regular rate and rhythm.  No gallops, murmurs or rub Abdomen: soft nontender, nondistended, with normal bowel sounds Extremities: no cyanosis, clubbing, or edema Peripheral Pulses: 2+ in all extremities, 2+ femoral pulses bilaterally  Assessment   76 y.o. female with  1. Primary hypertension   2. Chest pain with high risk for cardiac etiology   3. Simple chronic bronchitis (CMS-HCC)   4. SOB (shortness of breath) on exertion   5. Type 2 diabetes mellitus without complication, without long-term current use of insulin  (CMS-HCC)    76 year old female with underlying COPD, recent diagnosis of a left upper lobe lung cancer status post radiation therapy, with 3-week history of new onset  left-sided chest pain described as pressure. Patient has chronic exertional dyspnea, occasional palpitations and mild chronic pedal edema. ECG nondiagnostic.  Scan Myoview  04/06/2022 revealed LVEF 33%, with stress induced apical lateral wall defects without completing the rest images.  Patient has essential hypertension, blood pressure well controlled on current BP medications.     Plan   1.  Continue current medications 2.  Counseled patient about low-sodium diet 3.  DASH diet printed instructions given to patient 4.  Counseled patient about low-cholesterol diet 5.  Low-fat and cholesterol diet print instructions given to the patient 6.  Diabetes diet printed instructions given to the patient 7.  Defer cardiac catheterization since chest pain has improved 8.  2D echocardiogram to evaluate left ventricular function 9.  Return to clinic after 2D echocardiogram  Orders Placed This Encounter  Procedures  . Echo complete    Return in about 1 week (around 05/29/2022), or after echo.  Breanna DOOMS, MD PhD [facility]

## 2022-11-09 ENCOUNTER — Encounter: Payer: Self-pay | Admitting: Oncology

## 2022-11-16 ENCOUNTER — Ambulatory Visit
Admission: RE | Admit: 2022-11-16 | Discharge: 2022-11-16 | Disposition: A | Discharge status: Home / Self Care | Payer: Medicare PPO | Source: Ambulatory Visit | Attending: Oncology | Admitting: Oncology

## 2022-11-16 ENCOUNTER — Other Ambulatory Visit: Payer: Medicare Other

## 2022-11-16 DIAGNOSIS — Z08 Encounter for follow-up examination after completed treatment for malignant neoplasm: Secondary | ICD-10-CM | POA: Insufficient documentation

## 2022-11-16 DIAGNOSIS — Z85118 Personal history of other malignant neoplasm of bronchus and lung: Secondary | ICD-10-CM | POA: Insufficient documentation

## 2022-11-20 ENCOUNTER — Ambulatory Visit: Payer: Medicare Other | Admitting: Oncology

## 2022-11-20 ENCOUNTER — Inpatient Hospital Stay: Payer: Medicare PPO | Attending: Oncology | Admitting: Oncology

## 2022-11-20 ENCOUNTER — Encounter: Payer: Self-pay | Admitting: Oncology

## 2022-11-20 VITALS — BP 125/69 | HR 65 | Temp 99.0°F | Resp 17 | Wt 194.0 lb

## 2022-11-20 DIAGNOSIS — Z87891 Personal history of nicotine dependence: Secondary | ICD-10-CM | POA: Insufficient documentation

## 2022-11-20 DIAGNOSIS — J4489 Other specified chronic obstructive pulmonary disease: Secondary | ICD-10-CM | POA: Diagnosis not present

## 2022-11-20 DIAGNOSIS — I10 Essential (primary) hypertension: Secondary | ICD-10-CM | POA: Insufficient documentation

## 2022-11-20 DIAGNOSIS — C3412 Malignant neoplasm of upper lobe, left bronchus or lung: Secondary | ICD-10-CM | POA: Diagnosis present

## 2022-11-20 DIAGNOSIS — Z90710 Acquired absence of both cervix and uterus: Secondary | ICD-10-CM | POA: Diagnosis not present

## 2022-11-20 DIAGNOSIS — Z9981 Dependence on supplemental oxygen: Secondary | ICD-10-CM | POA: Diagnosis not present

## 2022-11-20 NOTE — Progress Notes (Signed)
Patient here for oncology follow-up appointment, concerns of incontinence and headaches

## 2022-11-20 NOTE — Progress Notes (Signed)
Hematology/Oncology Consult note [facility]  [phone] [phone]  Patient Care Team: Gracelyn Nurse, MD as PCP - General (Internal Medicine) Glory Buff, RN as Oncology Nurse Navigator Vida Rigger, MD as Consulting Physician (Pulmonary Disease)   Name of the patient: [name]  [id number]  [birth year]   Date of visit: 11/20/22  Diagnosis- presumed diagnosis of lung cancer s/p SBRT   Chief complaint/ Reason for visit-routine follow-up of lung cancer  Heme/Onc history: Patient is a 77 year old female who was seen previously for iron deficiency anemia.  She was then found to have a solitary left upper lobe lung nodule 2 x 1.7 cm with an SUV of 7.4 but no other evidence of locoregional or metastatic disease.Patient was seen by radiation oncology and received empiric SBRT to the nodule given her baseline oxygen dependent COPD in September 2022.  She has been in remission since then and being followed up with surveillance scans.   Interval history-patient has baseline exertional shortness of breath from her COPD.  She has remained stable on inhalers.  No recent hospitalizations.  ECOG PS- 3 Pain scale- 0   Review of systems- Review of Systems  Constitutional:  Positive for malaise/fatigue. Negative for chills, fever and weight loss.  HENT:  Negative for congestion, ear discharge and nosebleeds.   Eyes:  Negative for blurred vision.  Respiratory:  Positive for shortness of breath. Negative for cough, hemoptysis, sputum production and wheezing.   Cardiovascular:  Negative for chest pain, palpitations, orthopnea and claudication.  Gastrointestinal:  Negative for abdominal pain, blood in stool, constipation, diarrhea, heartburn, melena, nausea and vomiting.  Genitourinary:  Negative for dysuria, flank pain, frequency, hematuria and urgency.  Musculoskeletal:  Negative for back pain, joint pain and myalgias.  Skin:  Negative for  rash.  Neurological:  Negative for dizziness, tingling, focal weakness, seizures, weakness and headaches.  Endo/Heme/Allergies:  Does not bruise/bleed easily.  Psychiatric/Behavioral:  Negative for depression and suicidal ideas. The patient does not have insomnia.       Allergies  Allergen Reactions   Levofloxacin Shortness Of Breath   Adhesive [Tape] Other (See Comments)   Other Rash    Plastic tape     Past Medical History:  Diagnosis Date   Allergy    Asthma    Back pain    Bronchitis    COPD (chronic obstructive pulmonary disease) (HCC)    Degenerative disc disease, lumbar    Depression    FHx: cholecystectomy    GERD (gastroesophageal reflux disease)    H/O: hysterectomy    Headache    Hypertension    Murmur    Osteoporosis    Pneumonia 03/31/16   being treated by PCP   Restless legs syndrome    Scoliosis    Shingles    Thyroid disease      Past Surgical History:  Procedure Laterality Date   ABDOMINAL HYSTERECTOMY     APPENDECTOMY     BACK SURGERY     CHOLECYSTECTOMY     COLONOSCOPY WITH PROPOFOL N/A 01/16/2018   Procedure: COLONOSCOPY WITH PROPOFOL;  Surgeon: Toledo, Boykin Nearing, MD;  Location: [facility] ENDOSCOPY;  Service: Gastroenterology;  Laterality: N/A;   COLONOSCOPY WITH PROPOFOL N/A 01/21/2021   Procedure: COLONOSCOPY WITH PROPOFOL;  Surgeon: Toney Reil, MD;  Location: [facility] ENDOSCOPY;  Service: Gastroenterology;  Laterality: N/A;   ESOPHAGOGASTRODUODENOSCOPY (EGD) WITH PROPOFOL N/A 01/16/2018   Procedure: ESOPHAGOGASTRODUODENOSCOPY (EGD) WITH PROPOFOL;  Surgeon: Norma Fredrickson, Boykin Nearing, MD;  Location: [facility] ENDOSCOPY;  Service: Gastroenterology;  Laterality: N/A;   ESOPHAGOGASTRODUODENOSCOPY (EGD) WITH PROPOFOL N/A 01/21/2021   Procedure: ESOPHAGOGASTRODUODENOSCOPY (EGD) WITH PROPOFOL;  Surgeon: Toney Reil, MD;  Location: [facility] ENDOSCOPY;  Service: Gastroenterology;  Laterality: N/A;   JOINT REPLACEMENT Left 1998   shoulder   NECK SURGERY  Bilateral    ROTATOR CUFF REPAIR Right    SHOULDER OPEN ROTATOR CUFF REPAIR Right    SHOULDER SURGERY Left    replacement    Social History   Socioeconomic History   Marital status: Widowed    Spouse name: Not on file   Number of children: Not on file   Years of education: Not on file   Highest education level: Not on file  Occupational History   Not on file  Tobacco Use   Smoking status: Former    Packs/day: 1.00    Years: 30.00    Total pack years: 30.00    Types: Cigarettes    Quit date: 11/04/2016    Years since quitting: 6.0   Smokeless tobacco: Never  Vaping Use   Vaping Use: Never used  Substance and Sexual Activity   Alcohol use: No    Alcohol/week: 0.0 standard drinks of alcohol   Drug use: No   Sexual activity: Not on file  Other Topics Concern   Not on file  Social History Narrative   Not on file   Social Determinants of Health   Financial Resource Strain: Not on file  Food Insecurity: Not on file  Transportation Needs: Not on file  Physical Activity: Not on file  Stress: Not on file  Social Connections: Not on file  Intimate Partner Violence: Not on file    Family History  Problem Relation Age of Onset   Kidney disease Father    Heart disease Mother    Diabetes Mother      Current Outpatient Medications:    albuterol (PROVENTIL HFA;VENTOLIN HFA) 108 (90 Base) MCG/ACT inhaler, Inhale 1-2 puffs into the lungs every 6 (six) hours as needed for wheezing or shortness of breath., Disp: , Rfl:    amLODipine (NORVASC) 5 MG tablet, Take 1 tablet by mouth daily., Disp: , Rfl:    bisacodyl (DULCOLAX) 5 MG EC tablet, Take 1 tablet (5 mg total) by mouth daily as needed for moderate constipation., Disp: 30 tablet, Rfl: 0   Cholecalciferol (VITAMIN D) 2000 UNITS tablet, Take 2,000 Units by mouth daily., Disp: , Rfl:    Fluticasone-Umeclidin-Vilant (TRELEGY ELLIPTA) 100-62.5-25 MCG/ACT AEPB, Inhale 1 puff into the lungs daily., Disp: 1 each, Rfl: 0    gabapentin (NEURONTIN) 100 MG capsule, Take 2 capsules (200 mg total) by mouth at bedtime., Disp: 30 capsule, Rfl: 0   levothyroxine (SYNTHROID, LEVOTHROID) 112 MCG tablet, Take 112 mcg by mouth daily before breakfast., Disp: , Rfl:    metoprolol tartrate (LOPRESSOR) 100 MG tablet, Take 100 mg by mouth 2 (two) times daily., Disp: , Rfl:    Multiple Vitamin (MULTIVITAMIN) tablet, Take 1 tablet by mouth daily. Reported on 04/05/2016, Disp: , Rfl:    omeprazole (PRILOSEC) 20 MG capsule, Take 20 mg by mouth 2 (two) times daily., Disp: , Rfl:    potassium chloride SA (K-DUR,KLOR-CON) 20 MEQ tablet, Take 20 mEq by mouth daily., Disp: , Rfl:    azithromycin (ZITHROMAX) 250 MG tablet, Take by mouth., Disp: , Rfl:    citalopram (CELEXA) 40 MG tablet, Take 40 mg by mouth daily. (Patient not taking: Reported on 11/20/2022), Disp: , Rfl:  doxycycline (VIBRAMYCIN) 100 MG capsule, Take 100 mg by mouth 2 (two) times daily. (Patient not taking: Reported on 11/20/2022), Disp: , Rfl:    fluconazole (DIFLUCAN) 150 MG tablet, SMARTSIG:1 Tablet(s) By Mouth 1-2 Times Daily (Patient not taking: Reported on 11/20/2022), Disp: , Rfl:    furosemide (LASIX) 20 MG tablet, Take 20 mg by mouth daily. (Patient not taking: Reported on 11/20/2022), Disp: , Rfl:    nystatin (MYCOSTATIN) 100000 UNIT/ML suspension, Take 5 mLs (500,000 Units total) by mouth 4 (four) times daily., Disp: 60 mL, Rfl: 0   nystatin cream (MYCOSTATIN), Apply topically 3 (three) times daily. (Patient not taking: Reported on 11/20/2022), Disp: , Rfl:    oxybutynin (DITROPAN-XL) 5 MG 24 hr tablet, Take 5 mg by mouth daily., Disp: , Rfl:    predniSONE (DELTASONE) 20 MG tablet, Take by mouth. (Patient not taking: Reported on 01/18/2022), Disp: , Rfl:    terconazole (TERAZOL 3) 0.8 % vaginal cream, SMARTSIG:1 Application Vaginal Every Night, Disp: , Rfl:   Physical exam:  Vitals:   11/20/22 1458  BP: 125/69  Pulse: 65  Resp: 17  Temp: 99 F (37.2 C)  SpO2: 100%   Weight: 194 lb (88 kg)   Physical Exam Constitutional:      Comments: She is on home oxygen.  Appears in no acute distress.  She is sitting in a wheelchair  Cardiovascular:     Rate and Rhythm: Normal rate and regular rhythm.     Heart sounds: Normal heart sounds.  Pulmonary:     Effort: Pulmonary effort is normal.     Comments: Breath sounds decreased bilaterally diffusely Skin:    General: Skin is warm and dry.  Neurological:     Mental Status: She is alert and oriented to person, place, and time.         Latest Ref Rng & Units 10/28/2021    4:31 AM  CMP  Glucose 70 - 99 mg/dL 790   BUN 8 - 23 mg/dL 14   Creatinine 3.83 - 1.00 mg/dL 3.38   Sodium 329 - 191 mmol/L 134   Potassium 3.5 - 5.1 mmol/L 4.2   Chloride 98 - 111 mmol/L 94   CO2 22 - 32 mmol/L 32   Calcium 8.9 - 10.3 mg/dL 8.9       Latest Ref Rng & Units 10/28/2021    4:31 AM  CBC  WBC 4.0 - 10.5 K/uL 13.8   Hemoglobin 12.0 - 15.0 g/dL 66.0   Hematocrit 60.0 - 46.0 % 32.6   Platelets 150 - 400 K/uL 231     No images are attached to the encounter.  CT Chest Wo Contrast  Result Date: 11/16/2022 CLINICAL DATA:  Lung cancer restaging, left upper lobe nodule status post radiation therapy * Tracking Code: BO * EXAM: CT CHEST WITHOUT CONTRAST TECHNIQUE: Multidetector CT imaging of the chest was performed following the standard protocol without IV contrast. RADIATION DOSE REDUCTION: This exam was performed according to the departmental dose-optimization program which includes automated exposure control, adjustment of the mA and/or kV according to patient size and/or use of iterative reconstruction technique. COMPARISON:  04/21/2022 FINDINGS: Cardiovascular: Aortic atherosclerosis. Normal heart size. Three-vessel coronary artery calcifications. No pericardial effusion. Mediastinum/Nodes: No enlarged mediastinal, hilar, or axillary lymph nodes. Large paraesophageal hernia with intrathoracic position of the gastric body  and fundus. Thyroid gland, trachea, and esophagus demonstrate no significant findings. Lungs/Pleura: Slight interval decrease in size of a treated nodule of the posterior left upper lobe  abutting the major fissure measuring 1.2 x 0.9 cm (series 3, image 25), previously 1.4 x 1.2 cm when measured with similar technique (series 3, image 25). Irregular ground-glass airspace opacity in this vicinity similar in appearance. Compressive atelectasis of the left lower lobe about a large paraesophageal hernia (series 3, image 84). No pleural effusion or pneumothorax. Upper Abdomen: No acute abnormality. Musculoskeletal: No chest wall abnormality. Unchanged superior endplate wedge deformity of T11 (series 7, image 86). IMPRESSION: 1. Slight interval decrease in size of a treated nodule of the posterior left upper lobe abutting the major fissure. Irregular ground-glass airspace opacity in this vicinity similar in appearance. Findings are consistent with continued treatment response and development of radiation pneumonitis/fibrosis. 2. Large paraesophageal hernia with intrathoracic position of the gastric body and fundus. 3. Coronary artery disease. Aortic Atherosclerosis (ICD10-I70.0). Electronically Signed   By: Jearld Lesch M.D.   On: 11/16/2022 13:49     Assessment and plan- Patient is a 77 y.o. female history of stage I lung cancer presumed without biopsy given underlying COPD s/p SBRT to left upper lobe.  This is a routine surveillance visit for lung cancer  I have reviewed CT chest images independently and discussed CT chest findings with the patient and her daughter.  Overall left upper lobe lung nodule appears stable since her prior radiation.  No evidence of hilar or mediastinal adenopathy.  Postradiation changes noted in the left upper lobe.  No evidence of progressive disease.  Ideally I would like to get a CT scan every 6 months but given patient's age and comorbidities patient and daughter prefer to get  yearly scans.  I will therefore see her back in 1 year with a CT chest prior   Visit Diagnosis 1. Primary cancer of left upper lobe of lung (HCC)      Dr. Owens Shark, MD, MPH [facility] [phone] 11/20/2022 4:40 PM

## 2023-01-24 ENCOUNTER — Other Ambulatory Visit: Payer: Self-pay

## 2023-01-24 ENCOUNTER — Emergency Department
Admission: EM | Admit: 2023-01-24 | Discharge: 2023-01-25 | Disposition: A | Discharge status: Home / Self Care | Payer: Medicare PPO | Attending: Emergency Medicine | Admitting: Emergency Medicine

## 2023-01-24 DIAGNOSIS — R1030 Lower abdominal pain, unspecified: Secondary | ICD-10-CM | POA: Insufficient documentation

## 2023-01-24 DIAGNOSIS — R39198 Other difficulties with micturition: Secondary | ICD-10-CM

## 2023-01-24 DIAGNOSIS — R339 Retention of urine, unspecified: Secondary | ICD-10-CM | POA: Diagnosis not present

## 2023-01-24 LAB — URINALYSIS, ROUTINE W REFLEX MICROSCOPIC
Bilirubin Urine: NEGATIVE
Glucose, UA: NEGATIVE mg/dL
Hgb urine dipstick: NEGATIVE
Ketones, ur: NEGATIVE mg/dL
Leukocytes,Ua: NEGATIVE
Nitrite: NEGATIVE
Protein, ur: NEGATIVE mg/dL
Specific Gravity, Urine: 1.02 (ref 1.005–1.030)
pH: 6 (ref 5.0–8.0)

## 2023-01-24 LAB — CBC WITH DIFFERENTIAL/PLATELET
Abs Immature Granulocytes: 0.02 10*3/uL (ref 0.00–0.07)
Basophils Absolute: 0.1 10*3/uL (ref 0.0–0.1)
Basophils Relative: 1 %
Eosinophils Absolute: 0.1 10*3/uL (ref 0.0–0.5)
Eosinophils Relative: 1 %
HCT: 35.2 % — ABNORMAL LOW (ref 36.0–46.0)
Hemoglobin: 11.3 g/dL — ABNORMAL LOW (ref 12.0–15.0)
Immature Granulocytes: 0 %
Lymphocytes Relative: 15 %
Lymphs Abs: 1.3 10*3/uL (ref 0.7–4.0)
MCH: 29.4 pg (ref 26.0–34.0)
MCHC: 32.1 g/dL (ref 30.0–36.0)
MCV: 91.4 fL (ref 80.0–100.0)
Monocytes Absolute: 1 10*3/uL (ref 0.1–1.0)
Monocytes Relative: 12 %
Neutro Abs: 5.9 10*3/uL (ref 1.7–7.7)
Neutrophils Relative %: 71 %
Platelets: 245 10*3/uL (ref 150–400)
RBC: 3.85 MIL/uL — ABNORMAL LOW (ref 3.87–5.11)
RDW: 13.2 % (ref 11.5–15.5)
WBC: 8.3 10*3/uL (ref 4.0–10.5)
nRBC: 0 % (ref 0.0–0.2)

## 2023-01-24 LAB — BASIC METABOLIC PANEL
Anion gap: 7 (ref 5–15)
BUN: 14 mg/dL (ref 8–23)
CO2: 31 mmol/L (ref 22–32)
Calcium: 9.1 mg/dL (ref 8.9–10.3)
Chloride: 95 mmol/L — ABNORMAL LOW (ref 98–111)
Creatinine, Ser: 0.44 mg/dL (ref 0.44–1.00)
GFR, Estimated: >60 mL/min (ref >60)
Glucose, Bld: 100 mg/dL — ABNORMAL HIGH (ref 70–99)
Potassium: 4.3 mmol/L (ref 3.5–5.1)
Sodium: 133 mmol/L — ABNORMAL LOW (ref 135–145)

## 2023-01-24 MED ORDER — SODIUM CHLORIDE 0.9 % IV BOLUS
500.0000 mL | Freq: Once | INTRAVENOUS | Status: AC
  Administered 2023-01-24: 500 mL via INTRAVENOUS

## 2023-01-24 NOTE — ED Notes (Addendum)
Bladder scan attempted multiple times with max ML achieved 15ML.  MD Archie Balboa made aware at this time

## 2023-01-24 NOTE — ED Triage Notes (Signed)
Pt BIBA from home where pt reports urinary retention since last night. Pt reports lower abdominal pain. Pt denies fevers or diarrhea. Pt A&Ox4

## 2023-01-24 NOTE — ED Provider Notes (Incomplete)
-----------------------------------------   11:36 PM on 01/24/2023 -----------------------------------------  Assuming care from Dr. Archie Balboa.  In short, [name] is a 77 y.o. female with a chief complaint of urinary retention.  Refer to the original H&P for additional details.  The current plan of care is to trial voiding, and if unsuccessful, likely discharge with Foley and leg back.       Medications  sodium chloride 0.9 % bolus 500 mL (500 mLs Intravenous New Bag/Given 01/24/23 2327)     ED Discharge Orders     None      Final diagnoses:  None

## 2023-01-24 NOTE — ED Provider Notes (Signed)
[facility] Provider Note    Event Date/Time   First MD Initiated Contact with Patient 01/24/23 1759     (approximate)   History   Urinary Retention   HPI  [name] is a 77 y.o. female who presents to the emergency department today because of concerns for urinary retention.  The patient has had 2 episodes today where she has got a very small amount of urine out.  She does feel the need to urinate.  This is accompanied by some lower abdominal discomfort.  Last had good urination yesterday.  She did notice some discomfort with urination yesterday.  She denies any fevers or chills.  Denies urinary retention in the past.  States she has had bladder surgeries in the past.     Physical Exam   Triage Vital Signs: ED Triage Vitals  Enc Vitals Group     BP 01/24/23 1811 (!) 139/90     Pulse Rate 01/24/23 1811 76     Resp 01/24/23 1811 14     Temp 01/24/23 1811 98.9 F (37.2 C)     Temp Source 01/24/23 1811 Oral     SpO2 01/24/23 1808 100 %     Weight 01/24/23 1813 193 lb (87.5 kg)     Height 01/24/23 1813 4\' 10"  (1.473 m)     Head Circumference --      Peak Flow --      Pain Score 01/24/23 1812 3     Pain Loc --      Pain Edu? --      Excl. in [place]? --     Most recent vital signs: Vitals:   01/24/23 1808 01/24/23 1811  BP:  (!) 139/90  Pulse:  76  Resp:  14  Temp:  98.9 F (37.2 C)  SpO2: 100% 100%   General: Awake, alert, oriented. CV:  Good peripheral perfusion. Regular rate and rhythm. Resp:  Normal effort. Lungs clear. Abd:  No distention. Minimally tender in the suprapubic region.    ED Results / Procedures / Treatments   Labs (all labs ordered are listed, but only abnormal results are displayed) Labs Reviewed  CBC WITH DIFFERENTIAL/PLATELET - Abnormal; Notable for the following components:      Result Value   RBC 3.85 (*)    Hemoglobin 11.3 (*)    HCT 35.2 (*)    All other components within normal limits  BASIC METABOLIC  PANEL - Abnormal; Notable for the following components:   Sodium 133 (*)    Chloride 95 (*)    Glucose, Bld 100 (*)    All other components within normal limits  URINALYSIS, ROUTINE W REFLEX MICROSCOPIC - Abnormal; Notable for the following components:   Color, Urine YELLOW (*)    APPearance CLEAR (*)    All other components within normal limits     EKG  None   RADIOLOGY None    PROCEDURES:  Critical Care performed: No    MEDICATIONS ORDERED IN ED: Medications - No data to display   IMPRESSION / MDM / [place] / ED COURSE  I reviewed the triage vital signs and the nursing notes.                              Differential diagnosis includes, but is not limited to, UTI, cauda equina, dehydration, renal failure  Patient's presentation is most consistent with acute presentation with potential  threat to life or bodily function.  Patient presented to the emergency department today because of concerns for urinary retention.  Did attempt to BladderScan patient however unable to get good reading.  Given patient's concern for lower abdominal pain did have a catheter placed.  Initially only roughly 200 cc came out.  Patient states she did feel somewhat better.  Urine without any obvious signs of infection.  Did then remove the Foley.  The patient was then hydrated here.  She was watched and eventually was able to urinate on her own.  This time do think is reasonable for patient to be discharged home without Foley catheter.  Did encourage patient to follow-up with urology.  No concerns for cauda equina at this time.   FINAL CLINICAL IMPRESSION(S) / ED DIAGNOSES   Final diagnoses:  Abnormal urination     Note:  This document was prepared using Dragon voice recognition software and may include unintentional dictation errors.    Nance Pear, MD 01/25/23 [phone]

## 2023-01-25 NOTE — ED Notes (Signed)
Pt dc'd to home. Pt did not have home O2. I attempted to get the pt to stay and have daughter go retrieve it. Both pt and dazughter declined. Pt states, "I'll be fine." Daughter stated this is a common practice for them, she would check O2 sats as soon as she gets home and "she'll be 98% on RA."  I told them both how concerning this was to [name], but they both insisted on leaving with no O2.

## 2023-01-25 NOTE — Discharge Instructions (Signed)
Please seek medical attention for any high fevers, chest pain, shortness of breath, change in behavior, persistent vomiting, bloody stool or any other new or concerning symptoms.  

## 2023-01-25 NOTE — ED Notes (Signed)
Pt had incontinent episode of a large amount of urine. Pt was cleaned, dressed and transferred to wheelchair.

## 2023-03-22 ENCOUNTER — Encounter: Payer: Self-pay | Admitting: Anesthesiology

## 2023-03-29 ENCOUNTER — Encounter: Payer: Self-pay | Admitting: Ophthalmology

## 2023-03-29 NOTE — Anesthesia Preprocedure Evaluation (Deleted)
Anesthesia Evaluation    Airway        Dental  (+) Lower Dentures, Upper Dentures   Pulmonary COPD,  oxygen dependent, former smoker Oxygen dependent, can lie flat with oxygen          Cardiovascular hypertension,      Neuro/Psych    GI/Hepatic   Endo/Other    Renal/GU      Musculoskeletal   Abdominal   Peds  Hematology   Anesthesia Other Findings COPD  Scoliosis  Hypertension Bronchitis  Asthma Degenerative disc disease, lumbar  Thyroid disease Back pain H/O: hysterectomy  Allergy Shingles Pneumonia Depression  Restless legs syndrome Headache  GERD (gastroesophageal reflux disease) Osteoporosis  Family history of adverse reaction to anesthesia Neck stiffness  Wears hearing aid in left ear Wears dentures  Uses wheelchair    Reproductive/Obstetrics                              Anesthesia Physical Anesthesia Plan  ASA: 4  Anesthesia Plan:    Post-op Pain Management:    Induction:   PONV Risk Score and Plan:   Airway Management Planned:   Additional Equipment:   Intra-op Plan:   Post-operative Plan:   Informed Consent:   Plan Discussed with:   Anesthesia Plan Comments:         Anesthesia Quick Evaluation

## 2023-04-02 NOTE — Discharge Instructions (Signed)

## 2023-04-04 ENCOUNTER — Ambulatory Visit: Admission: RE | Admit: 2023-04-04 | Payer: Medicare PPO | Source: Home / Self Care | Admitting: Ophthalmology

## 2023-04-04 HISTORY — DX: Torticollis: M43.6

## 2023-04-04 HISTORY — DX: Dependence on wheelchair: Z99.3

## 2023-04-04 HISTORY — DX: Presence of external hearing-aid: Z97.4

## 2023-04-04 HISTORY — DX: Presence of dental prosthetic device (complete) (partial): Z97.2

## 2023-04-04 HISTORY — DX: Family history of other specified conditions: Z84.89

## 2023-04-04 SURGERY — PHACOEMULSIFICATION, CATARACT, WITH IOL INSERTION
Anesthesia: Topical | Laterality: Right

## 2023-05-22 ENCOUNTER — Ambulatory Visit: Payer: Medicare PPO | Admitting: Oncology

## 2023-05-30 ENCOUNTER — Encounter: Payer: Self-pay | Admitting: Ophthalmology

## 2023-05-31 ENCOUNTER — Encounter: Payer: Self-pay | Admitting: Ophthalmology

## 2023-05-31 NOTE — Anesthesia Preprocedure Evaluation (Addendum)
Anesthesia Evaluation  Patient identified by MRN, date of birth, ID band Patient awake    Reviewed: Allergy & Precautions, NPO status , Patient's Chart, lab work & pertinent test results  History of Anesthesia Complications Negative for: history of anesthetic complications  Airway Mallampati: III  TM Distance: >3 FB Neck ROM: Limited    Dental  (+) Poor Dentition, Upper Dentures   Pulmonary asthma , COPD,  COPD inhaler and oxygen dependent, former smoker On 4L [place]   Pulmonary exam normal        Cardiovascular hypertension, Normal cardiovascular exam+ Valvular Problems/Murmurs      Neuro/Psych  Headaches PSYCHIATRIC DISORDERS Anxiety Depression     Neuromuscular disease    GI/Hepatic Neg liver ROS, hiatal hernia,GERD  ,,  Endo/Other  diabetesHypothyroidism    Renal/GU      Musculoskeletal   Abdominal   Peds  Hematology  (+) Blood dyscrasia, anemia   Anesthesia Other Findings Past Medical History: No date: Allergy No date: Anxiety No date: Asthma No date: Back pain No date: Bronchitis No date: COPD (chronic obstructive pulmonary disease) (HCC) No date: Degenerative disc disease, lumbar No date: Depression No date: Family history of adverse reaction to anesthesia     Comment:  Son "flat lined" during appendectomy during 1980s No date: FHx: cholecystectomy No date: GERD (gastroesophageal reflux disease) No date: H/O: hysterectomy No date: Headache No date: Hypertension No date: Major depressive disorder No date: Murmur No date: Neck stiffness     Comment:  titanium rod.  limited up and down movement No date: Osteoporosis 03/31/2016: Pneumonia     Comment:  being treated by PCP No date: Restless legs syndrome No date: Scoliosis No date: Shingles No date: Thyroid disease No date: Uses wheelchair     Comment:  or walker.  Able to self transfer No date: Wears dentures     Comment:  full upper, partial  lower No date: Wears hearing aid in left ear     Comment:  has, doesn't wear  Past Surgical History: No date: ABDOMINAL HYSTERECTOMY No date: APPENDECTOMY No date: BACK SURGERY No date: CHOLECYSTECTOMY 01/16/2018: COLONOSCOPY WITH PROPOFOL; N/A     Comment:  Procedure: COLONOSCOPY WITH PROPOFOL;  Surgeon: Toledo,               Boykin Nearing, MD;  Location: [facility];  Service:               Gastroenterology;  Laterality: N/A; 01/21/2021: COLONOSCOPY WITH PROPOFOL; N/A     Comment:  Procedure: COLONOSCOPY WITH PROPOFOL;  Surgeon: Toney Reil, MD;  Location: [facility];  Service:               Gastroenterology;  Laterality: N/A; 01/16/2018: ESOPHAGOGASTRODUODENOSCOPY (EGD) WITH PROPOFOL; N/A     Comment:  Procedure: ESOPHAGOGASTRODUODENOSCOPY (EGD) WITH               PROPOFOL;  Surgeon: Toledo, Boykin Nearing, MD;  Location:               [facility];  Service: Gastroenterology;  Laterality:               N/A; 01/21/2021: ESOPHAGOGASTRODUODENOSCOPY (EGD) WITH PROPOFOL; N/A     Comment:  Procedure: ESOPHAGOGASTRODUODENOSCOPY (EGD) WITH               PROPOFOL;  Surgeon: Toney Reil, MD;  Location:  [facility];  Service: Gastroenterology;  Laterality:               N/A; 1998: JOINT REPLACEMENT; Left     Comment:  shoulder No date: NECK SURGERY; Bilateral No date: ROTATOR CUFF REPAIR; Right No date: SHOULDER OPEN ROTATOR CUFF REPAIR; Right No date: SHOULDER SURGERY; Left     Comment:  replacement  BMI    Body Mass Index: 39.29 kg/m      Reproductive/Obstetrics negative OB ROS                             Anesthesia Physical Anesthesia Plan  ASA: 3  Anesthesia Plan: MAC   Post-op Pain Management: Minimal or no pain anticipated   Induction: Intravenous  PONV Risk Score and Plan:   Airway Management Planned: Natural Airway and Nasal Cannula  Additional Equipment:   Intra-op Plan:   Post-operative  Plan:   Informed Consent: I have reviewed the patients History and Physical, chart, labs and discussed the procedure including the risks, benefits and alternatives for the proposed anesthesia with the patient or authorized representative who has indicated his/her understanding and acceptance.     Dental Advisory Given  Plan Discussed with: Anesthesiologist, CRNA and Surgeon  Anesthesia Plan Comments: (Patient consented for risks of anesthesia including but not limited to:  - adverse reactions to medications - damage to eyes, teeth, lips or other oral mucosa - nerve damage due to positioning  - sore throat or hoarseness - Damage to heart, brain, nerves, lungs, other parts of body or loss of life  Patient voiced understanding.)       Anesthesia Quick Evaluation

## 2023-06-04 NOTE — Discharge Instructions (Signed)

## 2023-06-06 ENCOUNTER — Encounter
Admission: RE | Disposition: A | Discharge status: Home / Self Care | Payer: Self-pay | Source: Home / Self Care | Attending: Ophthalmology

## 2023-06-06 ENCOUNTER — Other Ambulatory Visit: Payer: Self-pay

## 2023-06-06 ENCOUNTER — Ambulatory Visit
Admission: RE | Admit: 2023-06-06 | Discharge: 2023-06-06 | Disposition: A | Discharge status: Home / Self Care | Payer: Medicare PPO | Attending: Ophthalmology | Admitting: Ophthalmology

## 2023-06-06 ENCOUNTER — Ambulatory Visit: Payer: Medicare PPO | Admitting: Anesthesiology

## 2023-06-06 ENCOUNTER — Encounter: Payer: Self-pay | Admitting: Ophthalmology

## 2023-06-06 DIAGNOSIS — H2511 Age-related nuclear cataract, right eye: Secondary | ICD-10-CM | POA: Diagnosis not present

## 2023-06-06 DIAGNOSIS — Z87891 Personal history of nicotine dependence: Secondary | ICD-10-CM | POA: Diagnosis not present

## 2023-06-06 DIAGNOSIS — E1136 Type 2 diabetes mellitus with diabetic cataract: Secondary | ICD-10-CM | POA: Insufficient documentation

## 2023-06-06 DIAGNOSIS — Z833 Family history of diabetes mellitus: Secondary | ICD-10-CM | POA: Insufficient documentation

## 2023-06-06 HISTORY — DX: Major depressive disorder, single episode, unspecified: F32.9

## 2023-06-06 HISTORY — DX: Anxiety disorder, unspecified: F41.9

## 2023-06-06 HISTORY — PX: CATARACT EXTRACTION W/PHACO: SHX586

## 2023-06-06 SURGERY — PHACOEMULSIFICATION, CATARACT, WITH IOL INSERTION
Anesthesia: Monitor Anesthesia Care | Laterality: Right

## 2023-06-06 MED ORDER — SIGHTPATH DOSE#1 BSS IO SOLN
INTRAOCULAR | Status: DC | PRN
  Administered 2023-06-06: 52 mL via OPHTHALMIC

## 2023-06-06 MED ORDER — BRIMONIDINE TARTRATE-TIMOLOL 0.2-0.5 % OP SOLN
OPHTHALMIC | Status: DC | PRN
  Administered 2023-06-06: 1 [drp] via OPHTHALMIC

## 2023-06-06 MED ORDER — SIGHTPATH DOSE#1 BSS IO SOLN
INTRAOCULAR | Status: DC | PRN
  Administered 2023-06-06: 2 mL

## 2023-06-06 MED ORDER — SIGHTPATH DOSE#1 BSS IO SOLN
INTRAOCULAR | Status: DC | PRN
  Administered 2023-06-06: 15 mL via INTRAOCULAR

## 2023-06-06 MED ORDER — TETRACAINE HCL 0.5 % OP SOLN
1.0000 [drp] | OPHTHALMIC | Status: DC | PRN
  Administered 2023-06-06 (×3): 1 [drp] via OPHTHALMIC

## 2023-06-06 MED ORDER — SIGHTPATH DOSE#1 NA HYALUR & NA CHOND-NA HYALUR IO KIT
PACK | INTRAOCULAR | Status: DC | PRN
  Administered 2023-06-06: 1 via OPHTHALMIC

## 2023-06-06 MED ORDER — ARMC OPHTHALMIC DILATING DROPS
1.0000 | OPHTHALMIC | Status: DC | PRN
  Administered 2023-06-06 (×3): 1 via OPHTHALMIC

## 2023-06-06 MED ORDER — CEFUROXIME OPHTHALMIC INJECTION 1 MG/0.1 ML
INJECTION | OPHTHALMIC | Status: DC | PRN
  Administered 2023-06-06: 1 mg via INTRACAMERAL

## 2023-06-06 MED ORDER — FENTANYL CITRATE (PF) 100 MCG/2ML IJ SOLN
INTRAMUSCULAR | Status: DC | PRN
  Administered 2023-06-06: 50 ug via INTRAVENOUS

## 2023-06-06 SURGICAL SUPPLY — 9 items
CATARACT SUITE SIGHTPATH (MISCELLANEOUS) ×1 IMPLANT
FEE CATARACT SUITE SIGHTPATH (MISCELLANEOUS) ×1 IMPLANT
GLOVE SRG 8 PF TXTR STRL LF DI (GLOVE) ×1 IMPLANT
GLOVE SURG ENC TEXT LTX SZ7.5 (GLOVE) ×1 IMPLANT
GLOVE SURG UNDER POLY LF SZ8 (GLOVE) ×1
LENS IOL TECNIS EYHANCE 23.5 (Intraocular Lens) IMPLANT
NDL FILTER BLUNT 18X1 1/2 (NEEDLE) ×1 IMPLANT
NEEDLE FILTER BLUNT 18X1 1/2 (NEEDLE) ×1 IMPLANT
SYR 3ML LL SCALE MARK (SYRINGE) ×1 IMPLANT

## 2023-06-06 NOTE — Transfer of Care (Signed)
Immediate Anesthesia Transfer of Care Note  Patient: [name]  Procedure(s) Performed: CATARACT EXTRACTION PHACO AND INTRAOCULAR LENS PLACEMENT (IOC) RIGHT DIABETIC 9.96 00:51.0 (Right)  Patient Location: PACU  Anesthesia Type: MAC  Level of Consciousness: awake, alert  and patient cooperative  Airway and Oxygen Therapy: Patient Spontanous Breathing and Patient connected to supplemental oxygen  Post-op Assessment: Post-op Vital signs reviewed, Patient's Cardiovascular Status Stable, Respiratory Function Stable, Patent Airway and No signs of Nausea or vomiting  Post-op Vital Signs: Reviewed and stable  Complications: No notable events documented.

## 2023-06-06 NOTE — Anesthesia Postprocedure Evaluation (Signed)
Anesthesia Post Note  Patient: ROKIA QUANCE  Procedure(s) Performed: CATARACT EXTRACTION PHACO AND INTRAOCULAR LENS PLACEMENT (IOC) RIGHT DIABETIC 9.96 00:51.0 (Right)  Patient location during evaluation: PACU Anesthesia Type: MAC Level of consciousness: awake and alert Pain management: pain level controlled Vital Signs Assessment: post-procedure vital signs reviewed and stable Respiratory status: spontaneous breathing, nonlabored ventilation, respiratory function stable and patient connected to nasal cannula oxygen Cardiovascular status: stable and blood pressure returned to baseline Postop Assessment: no apparent nausea or vomiting Anesthetic complications: no   No notable events documented.   Last Vitals:  Vitals:   06/06/23 1112 06/06/23 1124  BP: 123/73 115/70  Pulse: 66 70  Resp: 18 15  Temp: (!) 36.2 C   SpO2: 100% 100%    Last Pain:  Vitals:   06/06/23 1124  TempSrc:   PainSc: 0-No pain                 Louie Boston

## 2023-06-06 NOTE — H&P (Signed)
[facility]   Primary Care Physician:  Gracelyn Nurse, MD Ophthalmologist: Dr. Lockie Mola  Pre-Procedure History & Physical: HPI:  [name] is a 77 y.o. female here for ophthalmic surgery.   Past Medical History:  Diagnosis Date   Allergy    Anxiety    Asthma    Back pain    Bronchitis    COPD (chronic obstructive pulmonary disease) (HCC)    Degenerative disc disease, lumbar    Depression    Family history of adverse reaction to anesthesia    Son "flat lined" during appendectomy during 1980s   FHx: cholecystectomy    GERD (gastroesophageal reflux disease)    H/O: hysterectomy    Headache    Hypertension    Major depressive disorder    Murmur    Neck stiffness    titanium rod.  limited up and down movement   Osteoporosis    Pneumonia 03/31/2016   being treated by PCP   Restless legs syndrome    Scoliosis    Shingles    Thyroid disease    Uses wheelchair    or walker.  Able to self transfer   Wears dentures    full upper, partial lower   Wears hearing aid in left ear    has, doesn't wear    Past Surgical History:  Procedure Laterality Date   ABDOMINAL HYSTERECTOMY     APPENDECTOMY     BACK SURGERY     CHOLECYSTECTOMY     COLONOSCOPY WITH PROPOFOL N/A 01/16/2018   Procedure: COLONOSCOPY WITH PROPOFOL;  Surgeon: Toledo, Boykin Nearing, MD;  Location: [facility];  Service: Gastroenterology;  Laterality: N/A;   COLONOSCOPY WITH PROPOFOL N/A 01/21/2021   Procedure: COLONOSCOPY WITH PROPOFOL;  Surgeon: Toney Reil, MD;  Location: [facility] ENDOSCOPY;  Service: Gastroenterology;  Laterality: N/A;   ESOPHAGOGASTRODUODENOSCOPY (EGD) WITH PROPOFOL N/A 01/16/2018   Procedure: ESOPHAGOGASTRODUODENOSCOPY (EGD) WITH PROPOFOL;  Surgeon: Toledo, Boykin Nearing, MD;  Location: [facility];  Service: Gastroenterology;  Laterality: N/A;   ESOPHAGOGASTRODUODENOSCOPY (EGD) WITH PROPOFOL N/A 01/21/2021   Procedure: ESOPHAGOGASTRODUODENOSCOPY (EGD) WITH PROPOFOL;   Surgeon: Toney Reil, MD;  Location: [facility] ENDOSCOPY;  Service: Gastroenterology;  Laterality: N/A;   JOINT REPLACEMENT Left 1998   shoulder   NECK SURGERY Bilateral    ROTATOR CUFF REPAIR Right    SHOULDER OPEN ROTATOR CUFF REPAIR Right    SHOULDER SURGERY Left    replacement    Prior to Admission medications   Medication Sig Start Date End Date Taking? Authorizing Provider  amLODipine (NORVASC) 5 MG tablet Take 1 tablet by mouth daily. 11/08/21  Yes [provider]  Cholecalciferol (VITAMIN D) 2000 UNITS tablet Take 2,000 Units by mouth daily.   Yes [provider]  Fluticasone-Umeclidin-Vilant (TRELEGY ELLIPTA) 200-62.5-25 MCG/ACT AEPB Inhale 2 1e11 Vector Genomes into the lungs 1 day or 1 dose.   Yes [provider]  furosemide (LASIX) 20 MG tablet Take 20 mg by mouth daily as needed. 05/24/21  Yes [provider]  gabapentin (NEURONTIN) 100 MG capsule Take 2 capsules (200 mg total) by mouth at bedtime. 05/05/19  Yes Altamese Dilling, MD  levothyroxine (SYNTHROID, LEVOTHROID) 112 MCG tablet Take 112 mcg by mouth daily before breakfast.   Yes [provider]  metoprolol tartrate (LOPRESSOR) 100 MG tablet Take 100 mg by mouth 2 (two) times daily. 04/08/21  Yes [provider]  Multiple Vitamin (MULTIVITAMIN) tablet Take 1 tablet by mouth daily. Reported on 04/05/2016  Yes [provider]  omeprazole (PRILOSEC) 20 MG capsule Take 20 mg by mouth 2 (two) times daily.   Yes [provider]  oxybutynin (DITROPAN-XL) 5 MG 24 hr tablet Take 5 mg by mouth daily.   Yes [provider]  OXYGEN Inhale 4 L into the lungs continuous.   Yes [provider]  potassium chloride SA (K-DUR,KLOR-CON) 20 MEQ tablet Take 20 mEq by mouth daily.   Yes [provider]  sertraline (ZOLOFT) 50 MG tablet Take 50 mg by mouth daily.   Yes [provider]  albuterol (PROVENTIL HFA;VENTOLIN HFA) 108 (90  Base) MCG/ACT inhaler Inhale 1-2 puffs into the lungs every 6 (six) hours as needed for wheezing or shortness of breath. Patient not taking: Reported on 03/29/2023    [provider]  fluticasone-salmeterol (ADVAIR) 250-50 MCG/ACT AEPB Inhale 1 puff into the lungs daily. Patient not taking: Reported on 05/30/2023    [provider]    Allergies as of 05/08/2023 - Review Complete 03/29/2023  Allergen Reaction Noted   Levofloxacin Shortness Of Breath 10/01/2020   Adhesive [tape] Other (See Comments) 02/18/2015   Other Rash 03/04/2015    Family History  Problem Relation Age of Onset   Kidney disease Father    Heart disease Mother    Diabetes Mother     Social History   Socioeconomic History   Marital status: Widowed    Spouse name: Not on file   Number of children: Not on file   Years of education: Not on file   Highest education level: Not on file  Occupational History   Not on file  Tobacco Use   Smoking status: Former    Current packs/day: 0.00    Average packs/day: 1 pack/day for 30.0 years (30.0 ttl pk-yrs)    Types: Cigarettes    Start date: 11/04/1986    Quit date: 11/04/2016    Years since quitting: 6.5   Smokeless tobacco: Never  Vaping Use   Vaping status: Never Used  Substance and Sexual Activity   Alcohol use: No    Alcohol/week: 0.0 standard drinks of alcohol   Drug use: No   Sexual activity: Not on file  Other Topics Concern   Not on file  Social History Narrative   Not on file   Social Determinants of Health   Financial Resource Strain: Not on file  Food Insecurity: Not on file  Transportation Needs: Not on file  Physical Activity: Not on file  Stress: Not on file  Social Connections: Not on file  Intimate Partner Violence: Not on file    Review of Systems: See HPI, otherwise negative ROS  Physical Exam: BP 124/63   Pulse 70   Temp (!) 97.2 F (36.2 C) (Temporal)   Resp 19   Ht 4\' 10"  (1.473 m)   Wt 86.3 kg   SpO2 100%  Comment: 4L  BMI 39.77 kg/m  General:   Alert,  pleasant and cooperative in NAD Head:  Normocephalic and atraumatic. Lungs:  Clear to auscultation.    Heart:  Regular rate and rhythm.   Impression/Plan: [name] is here for ophthalmic surgery.  Risks, benefits, limitations, and alternatives regarding ophthalmic surgery have been reviewed with the patient.  Questions have been answered.  All parties agreeable.   Lockie Mola, MD  06/06/2023, 9:38 AM

## 2023-06-06 NOTE — Op Note (Signed)
LOCATION:  [facility]   PREOPERATIVE DIAGNOSIS:    Nuclear sclerotic cataract right eye. H25.11   POSTOPERATIVE DIAGNOSIS:  Nuclear sclerotic cataract right eye.     PROCEDURE:  Phacoemusification with posterior chamber intraocular lens placement of the right eye   ULTRASOUND TIME: Procedure(s): CATARACT EXTRACTION PHACO AND INTRAOCULAR LENS PLACEMENT (IOC) RIGHT DIABETIC 9.96 00:51.0 (Right)  LENS:   Implant Name Type Inv. Item Serial No. Manufacturer Lot No. LRB No. Used Action  LENS IOL TECNIS EYHANCE 23.5 - [id number] Intraocular Lens LENS IOL TECNIS EYHANCE 23.5 [id number] [employer]  Right 1 Implanted         SURGEON:  Deirdre Evener, MD   ANESTHESIA:  Topical with tetracaine drops and 2% Xylocaine jelly, augmented with 1% preservative-free intracameral lidocaine.    COMPLICATIONS:  None.   DESCRIPTION OF PROCEDURE:  The patient was identified in the holding room and transported to the operating room and placed in the supine position under the operating microscope.  The right eye was identified as the operative eye and it was prepped and draped in the usual sterile ophthalmic fashion.   A 1 millimeter clear-corneal paracentesis was made at the 12:00 position.  0.5 ml of preservative-free 1% lidocaine was injected into the anterior chamber. The anterior chamber was filled with Viscoat viscoelastic.  A 2.4 millimeter keratome was used to make a near-clear corneal incision at the 9:00 position.  A curvilinear capsulorrhexis was made with a cystotome and capsulorrhexis forceps.  Balanced salt solution was used to hydrodissect and hydrodelineate the nucleus.   Phacoemulsification was then used in stop and chop fashion to remove the lens nucleus and epinucleus.  The remaining cortex was then removed using the irrigation and aspiration handpiece. Provisc was then placed into the capsular bag to distend it for lens placement.  A lens was then injected into the capsular  bag.  The remaining viscoelastic was aspirated.  There was a possible tear superiorly in the capsule, but the IOL remained centered and there was no vitreous present. Wounds were hydrated with balanced salt solution.  The anterior chamber was inflated to a physiologic pressure with balanced salt solution.  No wound leaks were noted. Cefuroxime 0.1 ml of a 10mg /ml solution was injected into the anterior chamber for a dose of 1 mg of intracameral antibiotic at the completion of the case.   Timolol and Brimonidine drops were applied to the eye.  The patient was taken to the recovery room in stable condition without complications of anesthesia or surgery.   , 06/06/2023, 11:09 AM

## 2023-06-07 ENCOUNTER — Encounter: Payer: Self-pay | Admitting: Ophthalmology

## 2023-11-21 ENCOUNTER — Ambulatory Visit
Admission: RE | Admit: 2023-11-21 | Discharge: 2023-11-21 | Disposition: A | Discharge status: Home / Self Care | Payer: Medicare PPO | Source: Ambulatory Visit | Attending: Oncology | Admitting: Oncology

## 2023-11-21 DIAGNOSIS — C3412 Malignant neoplasm of upper lobe, left bronchus or lung: Secondary | ICD-10-CM | POA: Diagnosis present

## 2023-11-28 ENCOUNTER — Encounter: Payer: Self-pay | Admitting: Oncology

## 2023-11-28 ENCOUNTER — Inpatient Hospital Stay: Payer: Medicare PPO | Attending: Oncology | Admitting: Oncology

## 2023-11-28 VITALS — BP 121/78 | HR 79 | Temp 98.5°F | Resp 17 | Ht <= 58 in | Wt 182.0 lb

## 2023-11-28 DIAGNOSIS — Z08 Encounter for follow-up examination after completed treatment for malignant neoplasm: Secondary | ICD-10-CM

## 2023-11-28 DIAGNOSIS — R918 Other nonspecific abnormal finding of lung field: Secondary | ICD-10-CM | POA: Diagnosis present

## 2023-11-28 DIAGNOSIS — Z85118 Personal history of other malignant neoplasm of bronchus and lung: Secondary | ICD-10-CM | POA: Diagnosis not present

## 2023-11-28 DIAGNOSIS — Z87891 Personal history of nicotine dependence: Secondary | ICD-10-CM | POA: Insufficient documentation

## 2023-11-28 DIAGNOSIS — Z923 Personal history of irradiation: Secondary | ICD-10-CM | POA: Insufficient documentation

## 2023-11-28 DIAGNOSIS — Z9981 Dependence on supplemental oxygen: Secondary | ICD-10-CM | POA: Diagnosis not present

## 2023-11-28 DIAGNOSIS — J449 Chronic obstructive pulmonary disease, unspecified: Secondary | ICD-10-CM | POA: Diagnosis not present

## 2023-11-28 NOTE — Progress Notes (Signed)
Hematology/Oncology Consult note [facility]  [phone] [phone]  Patient Care Team: Gracelyn Nurse, MD as PCP - General (Internal Medicine) Glory Buff, RN as Oncology Nurse Navigator Vida Rigger, MD as Consulting Physician (Pulmonary Disease)   Name of the patient: [name]  [id number]  01-Oct-1946   Date of visit: 11/28/23  Diagnosis- presumed diagnosis of lung cancer s/p SBRT   Chief complaint/ Reason for visit-routine follow-up of lung cancer  Heme/Onc history:  Patient is a 78 year old female who was seen previously for iron deficiency anemia.  She was then found to have a solitary left upper lobe lung nodule 2 x 1.7 cm with an SUV of 7.4 but no other evidence of locoregional or metastatic disease.Patient was seen by radiation oncology and received empiric SBRT to the nodule given her baseline oxygen dependent COPD in September 2022.  She has been in remission since then and being followed up with surveillance scans.   Interval history-no recent hospitalizations.  Respiratory status has been stable on home oxygen.  She has been reporting increasing wheezing and will be seen in urgent care today ECOG PS- 3 Pain scale- 0   Review of systems- Review of Systems  Constitutional:  Positive for malaise/fatigue. Negative for chills, fever and weight loss.  HENT:  Negative for congestion, ear discharge and nosebleeds.   Eyes:  Negative for blurred vision.  Respiratory:  Positive for shortness of breath. Negative for cough, hemoptysis, sputum production and wheezing.   Cardiovascular:  Negative for chest pain, palpitations, orthopnea and claudication.  Gastrointestinal:  Negative for abdominal pain, blood in stool, constipation, diarrhea, heartburn, melena, nausea and vomiting.  Genitourinary:  Negative for dysuria, flank pain, frequency, hematuria and urgency.  Musculoskeletal:  Negative for back pain, joint pain and  myalgias.  Skin:  Negative for rash.  Neurological:  Negative for dizziness, tingling, focal weakness, seizures, weakness and headaches.  Endo/Heme/Allergies:  Does not bruise/bleed easily.  Psychiatric/Behavioral:  Negative for depression and suicidal ideas. The patient does not have insomnia.       Allergies  Allergen Reactions   Levofloxacin Shortness Of Breath   Adhesive [Tape] Other (See Comments)   Other Rash    Plastic tape     Past Medical History:  Diagnosis Date   Allergy    Anxiety    Asthma    Back pain    Bronchitis    COPD (chronic obstructive pulmonary disease) (HCC)    Degenerative disc disease, lumbar    Depression    Family history of adverse reaction to anesthesia    Son "flat lined" during appendectomy during 1980s   FHx: cholecystectomy    GERD (gastroesophageal reflux disease)    H/O: hysterectomy    Headache    Hypertension    Major depressive disorder    Murmur    Neck stiffness    titanium rod.  limited up and down movement   Osteoporosis    Pneumonia 03/31/2016   being treated by PCP   Restless legs syndrome    Scoliosis    Shingles    Thyroid disease    Uses wheelchair    or walker.  Able to self transfer   Wears dentures    full upper, partial lower   Wears hearing aid in left ear    has, doesn't wear     Past Surgical History:  Procedure Laterality Date   ABDOMINAL HYSTERECTOMY     APPENDECTOMY  BACK SURGERY     CATARACT EXTRACTION W/PHACO Right 06/06/2023   Procedure: CATARACT EXTRACTION PHACO AND INTRAOCULAR LENS PLACEMENT (IOC) RIGHT DIABETIC 9.96 00:51.0;  Surgeon: Lockie Mola, MD;  Location: [facility];  Service: Ophthalmology;  Laterality: Right;   CHOLECYSTECTOMY     COLONOSCOPY WITH PROPOFOL N/A 01/16/2018   Procedure: COLONOSCOPY WITH PROPOFOL;  Surgeon: Toledo, Boykin Nearing, MD;  Location: [facility] END[facility];  Service: Gastroenterology;  Laterality: N/A;   COLONOSCOPY WITH PROPOFOL N/A 01/21/2021    Procedure: COLONOSCOPY WITH PROPOFOL;  Surgeon: Toney Reil, MD;  Location: [facility] END[facility]COPY;  Service: Gastroenterology;  Laterality: N/A;   ESOPHAGOGASTRODUODENOSCOPY (EGD) WITH PROPOFOL N/A 01/16/2018   Procedure: ESOPHAGOGASTRODUODENOSCOPY (EGD) WITH PROPOFOL;  Surgeon: Toledo, Boykin Nearing, MD;  Location: [facility] ENDOSCOPY;  Service: Gastroenterology;  Laterality: N/A;   ESOPHAGOGASTRODUODENOSCOPY (EGD) WITH PROPOFOL N/A 01/21/2021   Procedure: ESOPHAGOGASTRODUODENOSCOPY (EGD) WITH PROPOFOL;  Surgeon: Toney Reil, MD;  Location: [facility] ENDOSCOPY;  Service: Gastroenterology;  Laterality: N/A;   JOINT REPLACEMENT Left 1998   shoulder   NECK SURGERY Bilateral    ROTATOR CUFF REPAIR Right    SHOULDER OPEN ROTATOR CUFF REPAIR Right    SHOULDER SURGERY Left    replacement    Social History   Socioeconomic History   Marital status: Widowed    Spouse name: Not on file   Number of children: Not on file   Years of education: Not on file   Highest education level: Not on file  Occupational History   Not on file  Tobacco Use   Smoking status: Former    Current packs/day: 0.00    Average packs/day: 1 pack/day for 30.0 years (30.0 ttl pk-yrs)    Types: Cigarettes    Start date: 11/04/1986    Quit date: 11/04/2016    Years since quitting: 7.0   Smokeless tobacco: Never  Vaping Use   Vaping status: Never Used  Substance and Sexual Activity   Alcohol use: No    Alcohol/week: 0.0 standard drinks of alcohol   Drug use: No   Sexual activity: Not on file  Other Topics Concern   Not on file  Social History Narrative   Not on file   Social Drivers of Health   Financial Resource Strain: Low Risk  (08/16/2023)   Received from [facility]   Overall Financial Resource Strain (CARDIA)    Difficulty of Paying Living Expenses: Not very hard  Food Insecurity: Unknown (08/16/2023)   Received from [facility]   Hunger Vital Sign    Worried About Running  Out of Food in the Last Year: Never true    Ran Out of Food in the Last Year: Patient declined  Transportation Needs: No Transportation Needs (08/16/2023)   Received from [facility] - Transportation    In the past 12 months, has lack of transportation kept you from medical appointments or from getting medications?: No    Lack of Transportation (Non-Medical): No  Physical Activity: Not on file  Stress: Not on file  Social Connections: Not on file  Intimate Partner Violence: Not on file    Family History  Problem Relation Age of Onset   Kidney disease Father    Heart disease Mother    Diabetes Mother      Current Outpatient Medications:    amLODipine (NORVASC) 5 MG tablet, Take 1 tablet by mouth daily., Disp: , Rfl:    Cholecalciferol (VITAMIN D) 2000 UNITS tablet, Take  2,000 Units by mouth daily., Disp: , Rfl:    doxycycline (VIBRAMYCIN) 100 MG capsule, Take 100 mg by mouth 2 (two) times daily., Disp: , Rfl:    fluconazole (DIFLUCAN) 150 MG tablet, Take 150 mg by mouth once., Disp: , Rfl:    Fluticasone-Umeclidin-Vilant (TRELEGY ELLIPTA) 200-62.5-25 MCG/ACT AEPB, Inhale 2 1e11 Vector Genomes into the lungs 1 day or 1 dose., Disp: , Rfl:    furosemide (LASIX) 20 MG tablet, Take 20 mg by mouth daily as needed., Disp: , Rfl:    gabapentin (NEURONTIN) 100 MG capsule, Take 2 capsules (200 mg total) by mouth at bedtime., Disp: 30 capsule, Rfl: 0   levothyroxine (SYNTHROID, LEVOTHROID) 112 MCG tablet, Take 112 mcg by mouth daily before breakfast., Disp: , Rfl:    metoprolol tartrate (LOPRESSOR) 100 MG tablet, Take 100 mg by mouth 2 (two) times daily., Disp: , Rfl:    Multiple Vitamin (MULTIVITAMIN) tablet, Take 1 tablet by mouth daily. Reported on 04/05/2016, Disp: , Rfl:    nystatin (MYCOSTATIN/NYSTOP) powder, Apply topically 2 (two) times daily., Disp: , Rfl:    omeprazole (PRILOSEC) 20 MG capsule, Take 20 mg by mouth 2 (two) times daily., Disp: , Rfl:     oxybutynin (DITROPAN-XL) 5 MG 24 hr tablet, Take 5 mg by mouth daily., Disp: , Rfl:    OXYGEN, Inhale 4 L into the lungs continuous., Disp: , Rfl:    potassium chloride SA (K-DUR,KLOR-CON) 20 MEQ tablet, Take 20 mEq by mouth daily., Disp: , Rfl:    sertraline (ZOLOFT) 50 MG tablet, Take 50 mg by mouth daily., Disp: , Rfl:    TRELEGY ELLIPTA 100-62.5-25 MCG/ACT AEPB, Inhale 1 puff into the lungs daily., Disp: , Rfl:    triamcinolone cream (KENALOG) 0.5 %, Apply 1 Application topically 2 (two) times daily., Disp: , Rfl:    albuterol (PROVENTIL HFA;VENTOLIN HFA) 108 (90 Base) MCG/ACT inhaler, Inhale 1-2 puffs into the lungs every 6 (six) hours as needed for wheezing or shortness of breath. (Patient not taking: Reported on 03/29/2023), Disp: , Rfl:    fluticasone-salmeterol (ADVAIR) 250-50 MCG/ACT AEPB, Inhale 1 puff into the lungs daily. (Patient not taking: Reported on 05/30/2023), Disp: , Rfl:   Physical exam:  Vitals:   11/28/23 1426  BP: 121/78  Pulse: 79  Resp: 17  Temp: 98.5 F (36.9 C)  TempSrc: Oral  SpO2: 100%  Weight: 182 lb (82.6 kg)  Height: 4\' 10"  (1.473 m)   Physical Exam Cardiovascular:     Rate and Rhythm: Normal rate and regular rhythm.     Heart sounds: Normal heart sounds.  Pulmonary:     Effort: Pulmonary effort is normal.     Comments: Scattered bilateral rhonchi Abdominal:     General: Bowel sounds are normal.     Palpations: Abdomen is soft.  Skin:    General: Skin is warm and dry.  Neurological:     Mental Status: She is alert and oriented to person, place, and time.         Latest Ref Rng & Units 01/24/2023    7:00 PM  CMP  Glucose 70 - 99 mg/dL 657   BUN 8 - 23 mg/dL 14   Creatinine 8.46 - 1.00 mg/dL 9.62   Sodium 952 - 841 mmol/L 133   Potassium 3.5 - 5.1 mmol/L 4.3   Chloride 98 - 111 mmol/L 95   CO2 22 - 32 mmol/L 31   Calcium 8.9 - 10.3 mg/dL 9.1  Latest Ref Rng & Units 01/24/2023    7:00 PM  CBC  WBC 4.0 - 10.5 K/uL 8.3    Hemoglobin 12.0 - 15.0 g/dL 16.1   Hematocrit 09.6 - 46.0 % 35.2   Platelets 150 - 400 K/uL 245     No images are attached to the encounter.  CT Chest Wo Contrast Result Date: 11/28/2023 CLINICAL DATA:  Restaging lung cancer. Status post radiation left upper lobe lesion. * Tracking Code: BO * EXAM: CT CHEST WITHOUT CONTRAST TECHNIQUE: Multidetector CT imaging of the chest was performed following the standard protocol without IV contrast. RADIATION DOSE REDUCTION: This exam was performed according to the departmental dose-optimization program which includes automated exposure control, adjustment of the mA and/or kV according to patient size and/or use of iterative reconstruction technique. COMPARISON:  Multiple prior imaging studies. The most recent chest CT is 11/16/2022 FINDINGS: Cardiovascular: The heart is normal in size. No pericardial effusion. The aorta is within limits in caliber. Stable scattered atherosclerotic calcifications. Stable branch vessel calcifications including coronary artery calcifications. Mediastinum/Nodes: No mediastinal or hilar mass or lymphadenopathy. Small scattered lymph nodes are stable. Stable large hiatal hernia and eventration of the left hemidiaphragm. Lungs/Pleura: Stable changes of chronic atelectasis or post pneumonic scarring changes in the left lower lobe. No change since prior studies. Stable nodularity in the left upper lobe with progressive with surrounding radiation fibrosis and bronchiectasis. Residual nodular area measures approximately 11 x 9 mm and is unchanged. Stable 3 mm right apical nodule, considered benign. No new pulmonary lesions or pulmonary nodules. Chronic emphysematous changes pulmonary scarring. Upper Abdomen: No significant upper abdominal findings. Scattered upper abdominal nodes are stable. Stable vascular calcifications. Status post cholecystectomy. Surgical changes from anterior abdominal wall hernia repair with a small recurrent hernia  unchanged. Musculoskeletal: No significant bony findings. Stable scoliosis, degenerative spine disease and remote compression fractures and lumbar fusion. IMPRESSION: 1. Stable treated left upper lobe lung nodule with progressive surrounding radiation fibrosis and bronchiectasis. 2. No new pulmonary lesions or pulmonary nodules. 3. No mediastinal or hilar mass or lymphadenopathy. 4. Stable large hiatal hernia and eventration of the left hemidiaphragm. 5. Stable changes of chronic atelectasis or post pneumonic scarring changes in the left lower lobe. 6. Stable 3 mm right apical nodule, considered benign. 7. Stable scoliosis, degenerative spine disease and remote compression fractures and lumbar fusion. Aortic Atherosclerosis (ICD10-I70.0) and Emphysema (ICD10-J43.9). Electronically Signed   By: Rudie Meyer M.D.   On: 11/28/2023 09:15     Assessment and plan- Patient is a 78 y.o. female history of stage I lung cancer presumed without biopsy given underlying COPD s/p SBRT to left upper lobe.  She is here to discuss CT scanResults and further management  CT chest without contrast in January 2025 showed no evidence of progressive disease.  Stable radiation fibrosis changes in the left upper lobe.  No hilar or mediastinal adenopathy or lung masses.  She also has a right apical nodule of 3 mm which has remained stable.  We discussed continuing yearly scans at this time based on her overall quality of life and goals.  I will tentatively see her back 2 weeks after her scan   Visit Diagnosis 1. Encounter for follow-up surveillance of lung cancer      Dr. Owens Shark, MD, MPH [facility] at [facility] [phone] 11/28/2023 4:38 PM

## 2023-11-28 NOTE — Progress Notes (Signed)
Patient here for oncology follow-up appointment, expresses no complaints or concerns at this time.

## 2023-11-30 ENCOUNTER — Encounter: Payer: Self-pay | Admitting: Oncology

## 2024-08-28 ENCOUNTER — Emergency Department

## 2024-08-28 ENCOUNTER — Inpatient Hospital Stay
Admission: EM | Admit: 2024-08-28 | Discharge: 2024-08-31 | DRG: 871 | Disposition: A | Discharge status: Home Health | Attending: Internal Medicine | Admitting: Internal Medicine

## 2024-08-28 ENCOUNTER — Other Ambulatory Visit: Payer: Self-pay

## 2024-08-28 DIAGNOSIS — Z79899 Other long term (current) drug therapy: Secondary | ICD-10-CM

## 2024-08-28 DIAGNOSIS — Z9109 Other allergy status, other than to drugs and biological substances: Secondary | ICD-10-CM

## 2024-08-28 DIAGNOSIS — Z833 Family history of diabetes mellitus: Secondary | ICD-10-CM | POA: Diagnosis not present

## 2024-08-28 DIAGNOSIS — Z8249 Family history of ischemic heart disease and other diseases of the circulatory system: Secondary | ICD-10-CM

## 2024-08-28 DIAGNOSIS — Z881 Allergy status to other antibiotic agents status: Secondary | ICD-10-CM

## 2024-08-28 DIAGNOSIS — Z79890 Hormone replacement therapy (postmenopausal): Secondary | ICD-10-CM

## 2024-08-28 DIAGNOSIS — J440 Chronic obstructive pulmonary disease with acute lower respiratory infection: Secondary | ICD-10-CM | POA: Diagnosis present

## 2024-08-28 DIAGNOSIS — A419 Sepsis, unspecified organism: Principal | ICD-10-CM | POA: Diagnosis present

## 2024-08-28 DIAGNOSIS — Z9981 Dependence on supplemental oxygen: Secondary | ICD-10-CM | POA: Diagnosis not present

## 2024-08-28 DIAGNOSIS — G2581 Restless legs syndrome: Secondary | ICD-10-CM | POA: Diagnosis present

## 2024-08-28 DIAGNOSIS — E1140 Type 2 diabetes mellitus with diabetic neuropathy, unspecified: Secondary | ICD-10-CM | POA: Diagnosis present

## 2024-08-28 DIAGNOSIS — Z974 Presence of external hearing-aid: Secondary | ICD-10-CM | POA: Diagnosis not present

## 2024-08-28 DIAGNOSIS — Z90710 Acquired absence of both cervix and uterus: Secondary | ICD-10-CM | POA: Diagnosis not present

## 2024-08-28 DIAGNOSIS — I5033 Acute on chronic diastolic (congestive) heart failure: Secondary | ICD-10-CM | POA: Diagnosis present

## 2024-08-28 DIAGNOSIS — J9621 Acute and chronic respiratory failure with hypoxia: Secondary | ICD-10-CM | POA: Diagnosis present

## 2024-08-28 DIAGNOSIS — Z8619 Personal history of other infectious and parasitic diseases: Secondary | ICD-10-CM

## 2024-08-28 DIAGNOSIS — M810 Age-related osteoporosis without current pathological fracture: Secondary | ICD-10-CM | POA: Diagnosis present

## 2024-08-28 DIAGNOSIS — Z1152 Encounter for screening for COVID-19: Secondary | ICD-10-CM

## 2024-08-28 DIAGNOSIS — J189 Pneumonia, unspecified organism: Secondary | ICD-10-CM | POA: Diagnosis present

## 2024-08-28 DIAGNOSIS — I110 Hypertensive heart disease with heart failure: Secondary | ICD-10-CM | POA: Diagnosis present

## 2024-08-28 DIAGNOSIS — R0602 Shortness of breath: Secondary | ICD-10-CM | POA: Diagnosis present

## 2024-08-28 DIAGNOSIS — Z7951 Long term (current) use of inhaled steroids: Secondary | ICD-10-CM

## 2024-08-28 DIAGNOSIS — F419 Anxiety disorder, unspecified: Secondary | ICD-10-CM | POA: Diagnosis present

## 2024-08-28 DIAGNOSIS — M419 Scoliosis, unspecified: Secondary | ICD-10-CM | POA: Diagnosis present

## 2024-08-28 DIAGNOSIS — K219 Gastro-esophageal reflux disease without esophagitis: Secondary | ICD-10-CM | POA: Diagnosis present

## 2024-08-28 DIAGNOSIS — Z87891 Personal history of nicotine dependence: Secondary | ICD-10-CM

## 2024-08-28 DIAGNOSIS — J441 Chronic obstructive pulmonary disease with (acute) exacerbation: Secondary | ICD-10-CM

## 2024-08-28 DIAGNOSIS — Z9049 Acquired absence of other specified parts of digestive tract: Secondary | ICD-10-CM

## 2024-08-28 DIAGNOSIS — L8990 Pressure ulcer of unspecified site, unspecified stage: Secondary | ICD-10-CM

## 2024-08-28 LAB — CBC
HCT: 35 % — ABNORMAL LOW (ref 36.0–46.0)
Hemoglobin: 11.1 g/dL — ABNORMAL LOW (ref 12.0–15.0)
MCH: 25.7 pg — ABNORMAL LOW (ref 26.0–34.0)
MCHC: 31.7 g/dL (ref 30.0–36.0)
MCV: 81 fL (ref 80.0–100.0)
Platelets: 390 K/uL (ref 150–400)
RBC: 4.32 MIL/uL (ref 3.87–5.11)
RDW: 16.2 % — ABNORMAL HIGH (ref 11.5–15.5)
WBC: 17.1 K/uL — ABNORMAL HIGH (ref 4.0–10.5)
nRBC: 0 % (ref 0.0–0.2)

## 2024-08-28 LAB — RESP PANEL BY RT-PCR (RSV, FLU A&B, COVID)  RVPGX2
Influenza A by PCR: NEGATIVE
Influenza B by PCR: NEGATIVE
Resp Syncytial Virus by PCR: NEGATIVE
SARS Coronavirus 2 by RT PCR: NEGATIVE

## 2024-08-28 LAB — COMPREHENSIVE METABOLIC PANEL WITH GFR
ALT: 18 U/L (ref 0–44)
AST: 26 U/L (ref 15–41)
Albumin: 3.7 g/dL (ref 3.5–5.0)
Alkaline Phosphatase: 65 U/L (ref 38–126)
Anion gap: 14 (ref 5–15)
BUN: 17 mg/dL (ref 8–23)
CO2: 25 mmol/L (ref 22–32)
Calcium: 9.2 mg/dL (ref 8.9–10.3)
Chloride: 97 mmol/L — ABNORMAL LOW (ref 98–111)
Creatinine, Ser: 0.47 mg/dL (ref 0.44–1.00)
GFR, Estimated: >60 mL/min (ref >60)
Glucose, Bld: 140 mg/dL — ABNORMAL HIGH (ref 70–99)
Potassium: 3.7 mmol/L (ref 3.5–5.1)
Sodium: 136 mmol/L (ref 135–145)
Total Bilirubin: 0.6 mg/dL (ref 0.0–1.2)
Total Protein: 8.1 g/dL (ref 6.5–8.1)

## 2024-08-28 LAB — TROPONIN I (HIGH SENSITIVITY)
Troponin I (High Sensitivity): 5 ng/L (ref <18)
Troponin I (High Sensitivity): 5 ng/L (ref <18)

## 2024-08-28 LAB — PROCALCITONIN: Procalcitonin: <0.1 ng/mL

## 2024-08-28 LAB — BRAIN NATRIURETIC PEPTIDE: B Natriuretic Peptide: 79.7 pg/mL (ref 0.0–100.0)

## 2024-08-28 LAB — LACTIC ACID, PLASMA
Lactic Acid, Venous: 2.4 mmol/L (ref 0.5–1.9)
Lactic Acid, Venous: 2.3 mmol/L (ref 0.5–1.9)

## 2024-08-28 MED ORDER — SERTRALINE HCL 50 MG PO TABS
100.0000 mg | ORAL_TABLET | Freq: Every day | ORAL | Status: DC
  Administered 2024-08-28 – 2024-08-30 (×4): 100 mg via ORAL
  Filled 2024-08-28 (×4): qty 2

## 2024-08-28 MED ORDER — ALBUTEROL SULFATE (2.5 MG/3ML) 0.083% IN NEBU
2.5000 mg | INHALATION_SOLUTION | RESPIRATORY_TRACT | Status: DC | PRN
Start: 2024-08-28 — End: 2024-08-31

## 2024-08-28 MED ORDER — OXYBUTYNIN CHLORIDE ER 5 MG PO TB24
5.0000 mg | ORAL_TABLET | Freq: Every day | ORAL | Status: DC
  Administered 2024-08-28: 5 mg via ORAL
  Filled 2024-08-28 (×2): qty 1

## 2024-08-28 MED ORDER — FUROSEMIDE 10 MG/ML IJ SOLN
20.0000 mg | Freq: Two times a day (BID) | INTRAMUSCULAR | Status: AC
  Administered 2024-08-28 – 2024-08-29 (×2): 20 mg via INTRAVENOUS
  Filled 2024-08-28 (×2): qty 2

## 2024-08-28 MED ORDER — SODIUM CHLORIDE 0.9 % IV SOLN
2.0000 g | Freq: Once | INTRAVENOUS | Status: AC
  Administered 2024-08-28: 2 g via INTRAVENOUS
  Filled 2024-08-28: qty 20

## 2024-08-28 MED ORDER — SERTRALINE HCL 50 MG PO TABS: 50.0000 mg | ORAL_TABLET | Freq: Every day | ORAL | Status: DC

## 2024-08-28 MED ORDER — LEVOTHYROXINE SODIUM 112 MCG PO TABS
112.0000 ug | ORAL_TABLET | Freq: Every day | ORAL | Status: DC
  Administered 2024-08-29 – 2024-08-31 (×3): 112 ug via ORAL
  Filled 2024-08-28 (×3): qty 1

## 2024-08-28 MED ORDER — PANTOPRAZOLE SODIUM 40 MG PO TBEC
40.0000 mg | DELAYED_RELEASE_TABLET | Freq: Every day | ORAL | Status: DC
  Administered 2024-08-28 – 2024-08-31 (×4): 40 mg via ORAL
  Filled 2024-08-28 (×4): qty 1

## 2024-08-28 MED ORDER — IPRATROPIUM-ALBUTEROL 0.5-2.5 (3) MG/3ML IN SOLN
3.0000 mL | Freq: Four times a day (QID) | RESPIRATORY_TRACT | Status: DC
  Administered 2024-08-28 (×2): 3 mL via RESPIRATORY_TRACT
  Filled 2024-08-28 (×2): qty 3

## 2024-08-28 MED ORDER — PREDNISONE 20 MG PO TABS
40.0000 mg | ORAL_TABLET | Freq: Every day | ORAL | Status: DC
  Administered 2024-08-29 – 2024-08-31 (×3): 40 mg via ORAL
  Filled 2024-08-28 (×3): qty 2

## 2024-08-28 MED ORDER — METHYLPREDNISOLONE SODIUM SUCC 40 MG IJ SOLR
40.0000 mg | Freq: Two times a day (BID) | INTRAMUSCULAR | Status: AC
  Administered 2024-08-28 – 2024-08-29 (×2): 40 mg via INTRAVENOUS
  Filled 2024-08-28 (×2): qty 1

## 2024-08-28 MED ORDER — METOPROLOL TARTRATE 50 MG PO TABS
100.0000 mg | ORAL_TABLET | Freq: Two times a day (BID) | ORAL | Status: DC
Start: 2024-08-28 — End: 2024-08-31
  Administered 2024-08-28 – 2024-08-31 (×7): 100 mg via ORAL
  Filled 2024-08-28 (×7): qty 2

## 2024-08-28 MED ORDER — SODIUM CHLORIDE 0.9 % IV SOLN
1.0000 g | INTRAVENOUS | Status: DC
  Administered 2024-08-29 – 2024-08-30 (×2): 1 g via INTRAVENOUS
  Filled 2024-08-28 (×3): qty 10

## 2024-08-28 MED ORDER — SODIUM CHLORIDE 0.9 % IV SOLN
500.0000 mg | Freq: Once | INTRAVENOUS | Status: AC
  Administered 2024-08-28: 500 mg via INTRAVENOUS
  Filled 2024-08-28: qty 5

## 2024-08-28 MED ORDER — FUROSEMIDE 10 MG/ML IJ SOLN
20.0000 mg | Freq: Once | INTRAMUSCULAR | Status: AC
  Administered 2024-08-28: 20 mg via INTRAVENOUS
  Filled 2024-08-28: qty 2

## 2024-08-28 MED ORDER — BUDESON-GLYCOPYRROL-FORMOTEROL 160-9-4.8 MCG/ACT IN AERO
2.0000 | INHALATION_SPRAY | Freq: Two times a day (BID) | RESPIRATORY_TRACT | Status: DC
Start: 2024-08-28 — End: 2024-08-31
  Administered 2024-08-28 – 2024-08-31 (×6): 2 via RESPIRATORY_TRACT
  Filled 2024-08-28 (×2): qty 5.9

## 2024-08-28 MED ORDER — ACETAMINOPHEN 325 MG PO TABS
650.0000 mg | ORAL_TABLET | Freq: Four times a day (QID) | ORAL | Status: DC | PRN
  Administered 2024-08-28 – 2024-08-29 (×2): 650 mg via ORAL
  Filled 2024-08-28 (×2): qty 2

## 2024-08-28 MED ORDER — ENOXAPARIN SODIUM 40 MG/0.4ML IJ SOSY
40.0000 mg | PREFILLED_SYRINGE | INTRAMUSCULAR | Status: DC
  Administered 2024-08-28 – 2024-08-30 (×3): 40 mg via SUBCUTANEOUS
  Filled 2024-08-28 (×3): qty 0.4

## 2024-08-28 MED ORDER — IPRATROPIUM-ALBUTEROL 0.5-2.5 (3) MG/3ML IN SOLN
3.0000 mL | Freq: Three times a day (TID) | RESPIRATORY_TRACT | Status: DC
  Administered 2024-08-29: 3 mL via RESPIRATORY_TRACT
  Filled 2024-08-28: qty 3

## 2024-08-28 MED ORDER — GABAPENTIN 100 MG PO CAPS
200.0000 mg | ORAL_CAPSULE | Freq: Every day | ORAL | Status: DC
  Administered 2024-08-28 – 2024-08-30 (×3): 200 mg via ORAL
  Filled 2024-08-28 (×3): qty 2

## 2024-08-28 MED ORDER — AZITHROMYCIN 250 MG PO TABS
500.0000 mg | ORAL_TABLET | Freq: Every day | ORAL | Status: DC
  Administered 2024-08-29 – 2024-08-31 (×3): 500 mg via ORAL
  Filled 2024-08-28 (×3): qty 2

## 2024-08-28 NOTE — ED Provider Notes (Signed)
 [facility] Provider Note    Event Date/Time   First MD Initiated Contact with Patient 08/28/24 1020     (approximate)  History   Chief Complaint: Respiratory Distress  HPI  [name] is a 78 y.o. female with a past Eckel history of COPD on 2 L chronically, gastric reflux, hypertension, anxiety, presents to the emergency department for worsening shortness of breath.  According to the patient over the last couple days she has had worsening shortness of breath acutely worse this morning.  Patient denies any increased cough, no fever.  No chest pain.  EMS states initial saturations of 69 to 70% on 2 L, placed the patient on CPAP which then increased to 89% on arrival.  Patient received 2 albuterol  treatments and a DuoNeb treatment and route to the hospital.  Also received 125 mg of Solu-Medrol  by EMS prior to arrival.  Patient here is awake alert no distress.  Currently on CPAP and states she is feeling better appears to be tolerating CPAP well.  Patient transition to BiPAP on arrival.  Physical Exam   Triage Vital Signs: ED Triage Vitals [08/28/24 1018]  Encounter Vitals Group     BP      Girls Systolic BP Percentile      Girls Diastolic BP Percentile      Boys Systolic BP Percentile      Boys Diastolic BP Percentile      Pulse Rate (!) 122     Resp      Temp      Temp src      SpO2      Weight      Height 4' 10 (1.473 m)     Head Circumference      Peak Flow      Pain Score 0     Pain Loc      Pain Education      Exclude from Growth Chart     Most recent vital signs: Vitals:   08/28/24 1018  Pulse: (!) 122    General: Awake, no distress.  CV:  Good peripheral perfusion.  Regular rate and rhythm  Resp:  Mild tachypnea in the mid 20s to around 30 breaths/min.  No obvious rales or rhonchi, no obvious wheeze breath sounds seem fairly diminished bilaterally however. Abd:  No distention.  Soft, nontender.  No rebound or guarding.  ED Results  / Procedures / Treatments   EKG  EKG viewed and interpreted by myself shows what appears to be sinus tachycardia at 123 bpm with a slightly widened QRS, left axis deviation, largely normal intervals nonspecific ST changes.  No obvious ST elevation.  Disagree with computer read of acute MI.  Significant electrical interference due to CPAP and breathing.  RADIOLOGY  I have reviewed and chest x-ray images.  Hypoventilation but no obvious consolidation. Radiology has read the x-ray as no acute process.   MEDICATIONS ORDERED IN ED: Medications - No data to display   IMPRESSION / MDM / ASSESSMENT AND PLAN / ED COURSE  I reviewed the triage vital signs and the nursing notes.  Patient's presentation is most consistent with acute presentation with potential threat to life or bodily function.  Patient presents emergency department for worsening shortness of breath EMS reports audible wheezes bilaterally with an initial saturation of 69 to 70% on 2 L.  No fever no cough over baseline per patient.  No chest pain.  Will check labs including cardiac enzymes and  a BNP.  Will obtain a chest x-ray.  Patient has received 3 breathing treatments just prior to arrival as well as 125 mg of IV Solu-Medrol .  Patient transitioned from CPAP to BiPAP.  Differential is quite broad but includes COPD exacerbation, pneumonia, CHF, ACS, pneumothorax, among other pathology.  Chest x-ray appears negative.  Patient has a leukocytosis of 17,000 otherwise reassuring CBC, reassuring chemistry, troponin is negative.  Highly suspect COPD exacerbation.  I have added on a procalcitonin to evaluate for any possible underlying bacterial issue.  Given the patient's low-grade temperature 99.4 and hypoxia we will cover with antibiotics as a precaution.  Will admit for COPD exacerbation.  Patient remains on BiPAP but doing much better.  Satting in the upper 90s currently.  CRITICAL CARE Performed by: Franky Moores   Total critical  care time: 30 minutes  Critical care time was exclusive of separately billable procedures and treating other patients.  Critical care was necessary to treat or prevent imminent or life-threatening deterioration.  Critical care was time spent personally by me on the following activities: development of treatment plan with patient and/or surrogate as well as nursing, discussions with consultants, evaluation of patient's response to treatment, examination of patient, obtaining history from patient or surrogate, ordering and performing treatments and interventions, ordering and review of laboratory studies, ordering and review of radiographic studies, pulse oximetry and re-evaluation of patient's condition.   FINAL CLINICAL IMPRESSION(S) / ED DIAGNOSES   Dyspnea Hypoxia COPD exacerbation    Note:  This document was prepared using Dragon voice recognition software and may include unintentional dictation errors.   Moores Franky, MD 08/28/24 1137

## 2024-08-28 NOTE — ED Triage Notes (Signed)
 Pt to ED via [facility] from home. Pt called out for respiratory distress and SOB for 2 hours. EMS reports expiratory wheeze and rhonchi. Pt has hx of COPD. SPO2 90% on CPAP. pt was 70% SPO2 at home on chronic 2L O2. Pt denies chest pain and fever.  EMS vitals  3 albuterol   1 duoneb  125mg  solumedrol   BP 133/80 HR 110

## 2024-08-28 NOTE — H&P (Signed)
 History and Physical    [name] FMW:[id number] DOB: 1945/12/30 DOA: 08/28/2024  PCP: Rudolpho Norleen BIRCH, MD (Confirm with patient/family/NH records and if not entered, this has to be entered at [facility] point of entry) Patient coming from: Home  I have personally briefly reviewed patient's old medical records in [facility]  Chief Complaint: Cough, wheezing, shortness of breath  HPI: [name] is a 78 y.o. female with medical history significant of COPD Gold stage II, chronic HFrEF on PRN Lasix , chronic hypoxic respiratory failure on 2 L continuously, HTN, IIDM, diabetic neuropathy, presented with new onset of cough wheezing shortness of breath.  Symptoms started this morning, patient woke up with new onset of productive cough with whitish phlegm and wheezing and shortness of breath denies any fever chills no chest pain.  Family called EMS, EMS arrived and found patient O2 saturation 70% on 2 L oxygen, she was placed on CPAP.  In addition, she was also given IV Solu-Medrol  DuoNebs.  Patient denied any runny nose sore throat.  ED Course: Temperature 99.4, pulse rate 110s-120s, blood pressure 127/98 O2 saturation 94% on BiPAP 100%.  Chest x-ray showed pulmonary congestion, blood work showed WBC 17.9 hemoglobin 11.1 BUN 17 creatinine 0.4 bicarb 25.  Patient was placed on BiPAP in the ED.  Review of Systems: As per HPI otherwise 14 point review of systems negative.    Past Medical History:  Diagnosis Date   Allergy    Anxiety    Asthma    Back pain    Bronchitis    COPD (chronic obstructive pulmonary disease) (HCC)    Degenerative disc disease, lumbar    Depression    Family history of adverse reaction to anesthesia    Son flat lined during appendectomy during 1980s   FHx: cholecystectomy    GERD (gastroesophageal reflux disease)    H/O: hysterectomy    Headache    Hypertension    Major depressive disorder    Murmur    Neck stiffness    titanium rod.  limited up and  down movement   Osteoporosis    Pneumonia 03/31/2016   being treated by PCP   Restless legs syndrome    Scoliosis    Shingles    Thyroid  disease    Uses wheelchair    or walker.  Able to self transfer   Wears dentures    full upper, partial lower   Wears hearing aid in left ear    has, doesn't wear    Past Surgical History:  Procedure Laterality Date   ABDOMINAL HYSTERECTOMY     APPENDECTOMY     BACK SURGERY     CATARACT EXTRACTION W/PHACO Right 06/06/2023   Procedure: CATARACT EXTRACTION PHACO AND INTRAOCULAR LENS PLACEMENT (IOC) RIGHT DIABETIC 9.96 00:51.0;  Surgeon: Mittie Gaskin, MD;  Location: [facility];  Service: Ophthalmology;  Laterality: Right;   CHOLECYSTECTOMY     COLONOSCOPY WITH PROPOFOL  N/A 01/16/2018   Procedure: COLONOSCOPY WITH PROPOFOL ;  Surgeon: Toledo, Ladell POUR, MD;  Location: [facility] ENDOSCOPY;  Service: Gastroenterology;  Laterality: N/A;   COLONOSCOPY WITH PROPOFOL  N/A 01/21/2021   Procedure: COLONOSCOPY WITH PROPOFOL ;  Surgeon: Unk Corinn Skiff, MD;  Location: [facility] ENDOSCOPY;  Service: Gastroenterology;  Laterality: N/A;   ESOPHAGOGASTRODUODENOSCOPY (EGD) WITH PROPOFOL  N/A 01/16/2018   Procedure: ESOPHAGOGASTRODUODENOSCOPY (EGD) WITH PROPOFOL ;  Surgeon: Toledo, Ladell POUR, MD;  Location: [facility] ENDOSCOPY;  Service: Gastroenterology;  Laterality: N/A;   ESOPHAGOGASTRODUODENOSCOPY (EGD) WITH PROPOFOL  N/A 01/21/2021   Procedure:  ESOPHAGOGASTRODUODENOSCOPY (EGD) WITH PROPOFOL ;  Surgeon: Unk Corinn Skiff, MD;  Location: [facility] ENDOSCOPY;  Service: Gastroenterology;  Laterality: N/A;   JOINT REPLACEMENT Left 1998   shoulder   NECK SURGERY Bilateral    ROTATOR CUFF REPAIR Right    SHOULDER OPEN ROTATOR CUFF REPAIR Right    SHOULDER SURGERY Left    replacement     reports that she quit smoking about 7 years ago. Her smoking use included cigarettes. She started smoking about 37 years ago. She has a 30 pack-year smoking history. She has never used  smokeless tobacco. She reports that she does not drink alcohol and does not use drugs.  Allergies  Allergen Reactions   Levofloxacin  Shortness Of Breath   Adhesive [Tape] Other (See Comments)   Other Rash    Plastic tape    Family History  Problem Relation Age of Onset   Kidney disease Father    Heart disease Mother    Diabetes Mother      Prior to Admission medications   Medication Sig Start Date End Date Taking? Authorizing Provider  albuterol  (PROVENTIL  HFA;VENTOLIN  HFA) 108 (90 Base) MCG/ACT inhaler Inhale 1-2 puffs into the lungs every 6 (six) hours as needed for wheezing or shortness of breath.   Yes [provider]  amLODipine  (NORVASC ) 5 MG tablet Take 1 tablet by mouth daily. 11/08/21  Yes [provider]  Cholecalciferol  (VITAMIN D) 2000 UNITS tablet Take 2,000 Units by mouth daily.   Yes [provider]  furosemide  (LASIX ) 20 MG tablet Take 20 mg by mouth daily as needed. 05/24/21  Yes [provider]  gabapentin  (NEURONTIN ) 100 MG capsule Take 2 capsules (200 mg total) by mouth at bedtime. 05/05/19  Yes Vachhani, Vaibhavkumar, MD  levothyroxine  (SYNTHROID , LEVOTHROID) 112 MCG tablet Take 112 mcg by mouth daily before breakfast.   Yes [provider]  metoprolol  tartrate (LOPRESSOR ) 100 MG tablet Take 100 mg by mouth 2 (two) times daily. 04/08/21  Yes [provider]  Multiple Vitamin (MULTIVITAMIN) tablet Take 1 tablet by mouth daily. Reported on 04/05/2016   Yes [provider]  omeprazole (PRILOSEC) 20 MG capsule Take 20 mg by mouth 2 (two) times daily.   Yes [provider]  oxybutynin (DITROPAN-XL) 5 MG 24 hr tablet Take 5 mg by mouth daily.   Yes [provider]  potassium chloride  SA (K-DUR,KLOR-CON ) 20 MEQ tablet Take 20 mEq by mouth daily.   Yes [provider]  sertraline (ZOLOFT) 100 MG tablet Take 100 mg by mouth daily.   Yes [provider]  TRELEGY ELLIPTA  100-62.5-25  MCG/ACT AEPB Inhale 1 puff into the lungs daily.   Yes [provider]  triamcinolone  cream (KENALOG ) 0.5 % Apply 1 Application topically 2 (two) times daily.   Yes [provider]  OXYGEN Inhale 4 L into the lungs continuous.    [provider]    Physical Exam: Vitals:   08/28/24 1018 08/28/24 1029 08/28/24 1030 08/28/24 1115  BP:  (!) 127/98 (!) 127/98 124/71  Pulse: (!) 122 (!) 122 (!) 120 (!) 116  Resp:  (!) 24 (!) 23 20  Temp:  99.4 F (37.4 C)    TempSrc:  Axillary    SpO2:  100% 92% 94%  Height: 4' 10 (1.473 m)       Constitutional: NAD, calm, comfortable Vitals:   08/28/24 1018 08/28/24 1029 08/28/24 1030 08/28/24 1115  BP:  (!) 127/98 (!) 127/98 124/71  Pulse: (!) 122 (!) 122 ROLLEN)  120 (!) 116  Resp:  (!) 24 (!) 23 20  Temp:  99.4 F (37.4 C)    TempSrc:  Axillary    SpO2:  100% 92% 94%  Height: 4' 10 (1.473 m)      Eyes: PERRL, lids and conjunctivae normal ENMT: Mucous membranes are moist. Posterior pharynx clear of any exudate or lesions.Normal dentition.  Neck: normal, supple, no masses, no thyromegaly Respiratory: Diminished breathing sound bilaterally, scattered wheezing, scattered crackles bilateral lower fields, increasing respiratory effort. No accessory muscle use.  Cardiovascular: Regular rate and rhythm, no murmurs / rubs / gallops. 2+ extremity edema. 2+ pedal pulses. No carotid bruits.  Abdomen: no tenderness, no masses palpated. No hepatosplenomegaly. Bowel sounds positive.  Musculoskeletal: no clubbing / cyanosis. No joint deformity upper and lower extremities. Good ROM, no contractures. Normal muscle tone.  Skin: no rashes, lesions, ulcers. No induration Neurologic: CN 2-12 grossly intact. Sensation intact, DTR normal. Strength 5/5 in all 4.  Psychiatric: Normal judgment and insight. Alert and oriented x 3. Normal mood.    Labs on Admission: I have personally reviewed following labs and imaging studies  CBC: Recent Labs   Lab 08/28/24 1021  WBC 17.1*  HGB 11.1*  HCT 35.0*  MCV 81.0  PLT 390   Basic Metabolic Panel: Recent Labs  Lab 08/28/24 1021  NA 136  K 3.7  CL 97*  CO2 25  GLUCOSE 140*  BUN 17  CREATININE 0.47  CALCIUM 9.2   GFR: CrCl cannot be calculated (Unknown ideal weight.). Liver Function Tests: Recent Labs  Lab 08/28/24 1021  AST 26  ALT 18  ALKPHOS 65  BILITOT 0.6  PROT 8.1  ALBUMIN 3.7   No results for input(s): LIPASE, AMYLASE in the last 168 hours. No results for input(s): AMMONIA in the last 168 hours. Coagulation Profile: No results for input(s): INR, PROTIME in the last 168 hours. Cardiac Enzymes: No results for input(s): CKTOTAL, CKMB, CKMBINDEX, TROPONINI in the last 168 hours. BNP (last 3 results) No results for input(s): PROBNP in the last 8760 hours. HbA1C: No results for input(s): HGBA1C in the last 72 hours. CBG: No results for input(s): GLUCAP in the last 168 hours. Lipid Profile: No results for input(s): CHOL, HDL, LDLCALC, TRIG, CHOLHDL, LDLDIRECT in the last 72 hours. Thyroid  Function Tests: No results for input(s): TSH, T4TOTAL, FREET4, T3FREE, THYROIDAB in the last 72 hours. Anemia Panel: No results for input(s): VITAMINB12, FOLATE, FERRITIN, TIBC, IRON , RETICCTPCT in the last 72 hours. Urine analysis:    Component Value Date/Time   COLORURINE YELLOW (A) 01/24/2023 1900   APPEARANCEUR CLEAR (A) 01/24/2023 1900   APPEARANCEUR Clear 03/05/2014 1731   LABSPEC 1.020 01/24/2023 1900   LABSPEC 1.009 03/05/2014 1731   PHURINE 6.0 01/24/2023 1900   GLUCOSEU NEGATIVE 01/24/2023 1900   GLUCOSEU Negative 03/05/2014 1731   HGBUR NEGATIVE 01/24/2023 1900   BILIRUBINUR NEGATIVE 01/24/2023 1900   BILIRUBINUR Negative 03/05/2014 1731   KETONESUR NEGATIVE 01/24/2023 1900   PROTEINUR NEGATIVE 01/24/2023 1900   NITRITE NEGATIVE 01/24/2023 1900   LEUKOCYTESUR NEGATIVE 01/24/2023 1900    LEUKOCYTESUR Negative 03/05/2014 1731    Radiological Exams on Admission: DG Chest Portable 1 View Result Date: 08/28/2024 EXAM: 1 VIEW(S) XRAY OF THE CHEST 08/28/2024 11:03:16 AM COMPARISON: 10/26/2021 CLINICAL HISTORY: sob sob FINDINGS: LUNGS AND PLEURA: Minimal bibasilar subsegmental atelectasis or scarring is noted. No pulmonary edema. No pleural effusion. No pneumothorax. HEART AND MEDIASTINUM: No acute abnormality of the cardiac and mediastinal silhouettes. Large hiatal hernia  is again noted. BONES AND SOFT TISSUES: Status post left shoulder arthroplasty. No acute osseous abnormality. IMPRESSION: 1. No acute cardiopulmonary process. 2. Large hiatal hernia. Electronically signed by: Lynwood Seip MD 08/28/2024 11:32 AM EDT RP Workstation: [id number]    EKG: Independently reviewed.  Sinus tachycardia, chronic LBBB  Assessment/Plan Active Problems:   Community acquired pneumonia   COPD with acute exacerbation (HCC)   COPD exacerbation (HCC)  (please populate well all problems here in Problem List. (For example, if patient is on BP meds at home and you resume or decide to hold them, it is a problem that needs to be her. Same for CAD, COPD, HLD and so on)  Acute on chronic hypoxic respiratory failure Acute COPD exacerbation Question of atypical pneumonia -Continue BiPAP support - IV Solu-Medrol  - ICS and LABA - DuoNebs and as needed albuterol  - Incentive spirometry - Culture sputum  Sepsis without acute endorgan damage - Sepsis is evidenced by tachycardia, leukocytosis, source infection as clinically suspected to be atypical pneumonia - Continue ceftriaxone  and azithromycin  - Blood culture sent -No IV bolus given as patient also has symptoms and signs of fluid overload CHF decompensation on physical exam and image study.    Acute on chronic HFrEF decompensation - Signs of fluid overload, revealed her past cardiac history showed a LHC in 2023 showed mild decreased LVEF. - Continue  twice daily Lasix  x 3 doses, recheck chest x-ray tomorrow - Echocardiogram  HTN - Continue metoprolol  twice daily.   DVT prophylaxis: Lovenox  Code Status: Full code Family Communication: Son-in-law at bedside Disposition Plan: Patient is sick with sepsis, CHF decompensation COPD exacerbation requiring BiPAP support IV steroids and IV diuresis, expect more than 2 midnights of stay Consults called: None Admission status: PCU admit   Cort ONEIDA Mana MD [facility] Pager [phone]  08/28/2024, 12:27 PM

## 2024-08-29 ENCOUNTER — Inpatient Hospital Stay
Admit: 2024-08-29 | Discharge: 2024-08-29 | Disposition: A | Discharge status: Home / Self Care | Attending: Internal Medicine | Admitting: Internal Medicine

## 2024-08-29 ENCOUNTER — Inpatient Hospital Stay

## 2024-08-29 DIAGNOSIS — L8990 Pressure ulcer of unspecified site, unspecified stage: Secondary | ICD-10-CM

## 2024-08-29 DIAGNOSIS — J189 Pneumonia, unspecified organism: Secondary | ICD-10-CM | POA: Diagnosis not present

## 2024-08-29 DIAGNOSIS — J441 Chronic obstructive pulmonary disease with (acute) exacerbation: Secondary | ICD-10-CM | POA: Diagnosis not present

## 2024-08-29 LAB — BASIC METABOLIC PANEL WITH GFR
Anion gap: 12 (ref 5–15)
BUN: 16 mg/dL (ref 8–23)
CO2: 29 mmol/L (ref 22–32)
Calcium: 8.2 mg/dL — ABNORMAL LOW (ref 8.9–10.3)
Chloride: 97 mmol/L — ABNORMAL LOW (ref 98–111)
Creatinine, Ser: 0.46 mg/dL (ref 0.44–1.00)
GFR, Estimated: >60 mL/min (ref >60)
Glucose, Bld: 127 mg/dL — ABNORMAL HIGH (ref 70–99)
Potassium: 3.2 mmol/L — ABNORMAL LOW (ref 3.5–5.1)
Sodium: 138 mmol/L (ref 135–145)

## 2024-08-29 LAB — CBC
HCT: 29.2 % — ABNORMAL LOW (ref 36.0–46.0)
Hemoglobin: 9.3 g/dL — ABNORMAL LOW (ref 12.0–15.0)
MCH: 24.9 pg — ABNORMAL LOW (ref 26.0–34.0)
MCHC: 31.8 g/dL (ref 30.0–36.0)
MCV: 78.3 fL — ABNORMAL LOW (ref 80.0–100.0)
Platelets: 311 K/uL (ref 150–400)
RBC: 3.73 MIL/uL — ABNORMAL LOW (ref 3.87–5.11)
RDW: 15.9 % — ABNORMAL HIGH (ref 11.5–15.5)
WBC: 20.5 K/uL — ABNORMAL HIGH (ref 4.0–10.5)
nRBC: 0 % (ref 0.0–0.2)

## 2024-08-29 LAB — ECHOCARDIOGRAM COMPLETE
AR max vel: 2.63 cm2
AV Area VTI: 2.36 cm2
AV Area mean vel: 2.55 cm2
AV Mean grad: 5 mmHg
AV Peak grad: 7.5 mmHg
Ao pk vel: 1.37 m/s
Area-P 1/2: 4.24 cm2
Height: 58 in
MV VTI: 2.16 cm2
S' Lateral: 2.7 cm

## 2024-08-29 MED ORDER — POLYETHYLENE GLYCOL 3350 17 G PO PACK
17.0000 g | PACK | Freq: Every day | ORAL | Status: DC
  Administered 2024-08-29 – 2024-08-31 (×3): 17 g via ORAL
  Filled 2024-08-29 (×3): qty 1

## 2024-08-29 MED ORDER — BISACODYL 5 MG PO TBEC
10.0000 mg | DELAYED_RELEASE_TABLET | Freq: Every day | ORAL | Status: DC
  Administered 2024-08-29 – 2024-08-31 (×3): 10 mg via ORAL
  Filled 2024-08-29 (×3): qty 2

## 2024-08-29 NOTE — Progress Notes (Signed)
*  PRELIMINARY RESULTS* Echocardiogram 2D Echocardiogram has been performed.  [name] 08/29/2024, 9:02 AM

## 2024-08-29 NOTE — Consult Note (Signed)
 WOC Nurse Consult Note: WOC consult performed remotely utilizing imaging and chart review Reason for Consult: wound to buttocks Wound type:  Evolving deep tissue pressure injury to L buttocks  Deep tissue pressure injury to R buttocks Pressure Injury POA: Yes Measurement: see nursing flow sheets Wound bed:  L buttocks: deep purple maroon discoloration with sloughing and small open area revealing red, dry tissue and scant yellow dry slough R buttocks: deep purple maroon discoloration with intact skin Drainage (amount, consistency, odor) see nursing flow sheets Periwound: Intact Dressing procedure/placement/frequency:   R/L buttocks: cleanse skin with Vashe (lawson # [id number]), allow to air dry.  Place Xeroform over wound bed and cover with silicone foam dressing.  Change daily.     WOC team will not follow patient at this time, please re consult if new needs arise or if wound deteriorates.  Thank you,  Doyal Polite, MSN, RN, [facility] WOC Team [phone] (Available Mon-Fri 0700-1500)

## 2024-08-29 NOTE — Progress Notes (Signed)
 [facility]  - [place] at [facility]   PATIENT NAME: [name]    MR#:  [id number]  DATE OF BIRTH:  08-27-1946  SUBJECTIVE:  no family at bedside came in with increasing shortness of breath. Trying to eat lunch. States breathing improved. Wears about 3:20 liters nasal cannula oxygen. Ambulated using walker. Denies any cough with phlegm. Has some dry cough    VITALS:  Blood pressure 109/71, pulse 75, temperature 98.5 F (36.9 C), temperature source Oral, resp. rate 16, height 4' 10 (1.473 m), SpO2 95%.  PHYSICAL EXAMINATION:   GENERAL:  78 y.o.-year-old patient with no acute distress. Morbidly obese LUNGS: decreased breath sounds bilaterally, no wheezing CARDIOVASCULAR: S1, S2 normal. No murmur   ABDOMEN: Soft, nontender, nondistended. Bowel sounds present.  EXTREMITIES: No  edema b/l.    NEUROLOGIC: nonfocal  patient is alert and awake SKIN:  Wound 08/28/24 1531 Pressure Injury Buttocks Left Deep Tissue Pressure Injury - Purple or maroon localized area of discolored intact skin or blood-filled blister due to damage of underlying soft tissue from pressure and/or shear. (Active)     Wound 08/28/24 1200 Pressure Injury Buttocks Right Deep Tissue Pressure Injury - Purple or maroon localized area of discolored intact skin or blood-filled blister due to damage of underlying soft tissue from pressure and/or shear. (Active)      LABORATORY PANEL:  CBC Recent Labs  Lab 08/29/24 0413  WBC 20.5*  HGB 9.3*  HCT 29.2*  PLT 311    Chemistries  Recent Labs  Lab 08/28/24 1021 08/29/24 0413  NA 136 138  K 3.7 3.2*  CL 97* 97*  CO2 25 29  GLUCOSE 140* 127*  BUN 17 16  CREATININE 0.47 0.46  CALCIUM 9.2 8.2*  AST 26  --   ALT 18  --   ALKPHOS 65  --   BILITOT 0.6  --    Cardiac Enzymes No results for input(s): TROPONINI in the last 168 hours. RADIOLOGY:  ECHOCARDIOGRAM COMPLETE Result Date: 08/29/2024    ECHOCARDIOGRAM REPORT   Patient Name:    [name] [place] Date of Exam: 08/29/2024 Medical Rec #:  [id number]     Height:       58.0 in Accession #:    [id number]    Weight:       182.0 lb Date of Birth:  [birth year]    BSA:          1.750 m Patient Age:    78 years      BP:           109/68 mmHg Patient Gender: F             HR:           84 bpm. Exam Location:  [facility] Procedure: 2D Echo, Cardiac Doppler and Color Doppler (Both Spectral and Color            Flow Doppler were utilized during procedure). Indications:     CHF-acute systolic I50.21  History:         Patient has no prior history of Echocardiogram examinations.                  COPD; Signs/Symptoms:Murmur.  Sonographer:     Christopher Furnace Referring Phys:  [id number] CORT ONEIDA MANA Diagnosing Phys: Marsa Dooms MD IMPRESSIONS  1. Left ventricular ejection fraction, by estimation, is 60 to 65%. The left ventricle has normal function. The left ventricle has no regional wall motion abnormalities. Left  ventricular diastolic parameters are consistent with Grade I diastolic dysfunction (impaired relaxation).  2. Right ventricular systolic function is normal. The right ventricular size is normal.  3. The mitral valve is normal in structure. Mild mitral valve regurgitation. No evidence of mitral stenosis.  4. The aortic valve is normal in structure. Aortic valve regurgitation is not visualized. No aortic stenosis is present.  5. The inferior vena cava is normal in size with greater than 50% respiratory variability, suggesting right atrial pressure of 3 mmHg. FINDINGS  Left Ventricle: Left ventricular ejection fraction, by estimation, is 60 to 65%. The left ventricle has normal function. The left ventricle has no regional wall motion abnormalities. Strain was performed and the global longitudinal strain is indeterminate. The left ventricular internal cavity size was normal in size. There is no left ventricular hypertrophy. Left ventricular diastolic parameters are consistent with Grade I diastolic dysfunction  (impaired relaxation). Right Ventricle: The right ventricular size is normal. No increase in right ventricular wall thickness. Right ventricular systolic function is normal. Left Atrium: Left atrial size was normal in size. Right Atrium: Right atrial size was normal in size. Pericardium: There is no evidence of pericardial effusion. Mitral Valve: The mitral valve is normal in structure. Mild mitral valve regurgitation. No evidence of mitral valve stenosis. MV peak gradient, 7.2 mmHg. The mean mitral valve gradient is 4.0 mmHg. Tricuspid Valve: The tricuspid valve is normal in structure. Tricuspid valve regurgitation is mild . No evidence of tricuspid stenosis. Aortic Valve: The aortic valve is normal in structure. Aortic valve regurgitation is not visualized. No aortic stenosis is present. Aortic valve mean gradient measures 5.0 mmHg. Aortic valve peak gradient measures 7.5 mmHg. Aortic valve area, by VTI measures 2.36 cm. Pulmonic Valve: The pulmonic valve was normal in structure. Pulmonic valve regurgitation is not visualized. No evidence of pulmonic stenosis. Aorta: The aortic root is normal in size and structure. Venous: The inferior vena cava is normal in size with greater than 50% respiratory variability, suggesting right atrial pressure of 3 mmHg. IAS/Shunts: No atrial level shunt detected by color flow Doppler. Additional Comments: 3D was performed not requiring image post processing on an independent workstation and was indeterminate.  LEFT VENTRICLE PLAX 2D LVIDd:         4.30 cm   Diastology LVIDs:         2.70 cm   LV e' medial:    6.74 cm/s LV PW:         1.20 cm   LV E/e' medial:  14.0 LV IVS:        1.00 cm   LV e' lateral:   10.00 cm/s LVOT diam:     2.10 cm   LV E/e' lateral: 9.4 LV SV:         60 LV SV Index:   34 LVOT Area:     3.46 cm  RIGHT VENTRICLE RV Basal diam:  3.80 cm RV Mid diam:    2.90 cm RV S prime:     16.90 cm/s TAPSE (M-mode): 1.2 cm LEFT ATRIUM             Index        RIGHT  ATRIUM           Index LA diam:        2.70 cm 1.54 cm/m   RA Area:     15.70 cm LA Vol (A2C):   39.7 ml 22.69 ml/m  RA Volume:   41.20 ml  23.55 ml/m LA Vol (A4C):   25.0 ml 14.29 ml/m LA Biplane Vol: 31.7 ml 18.12 ml/m  AORTIC VALVE AV Area (Vmax):    2.63 cm AV Area (Vmean):   2.55 cm AV Area (VTI):     2.36 cm AV Vmax:           137.00 cm/s AV Vmean:          99.600 cm/s AV VTI:            0.254 m AV Peak Grad:      7.5 mmHg AV Mean Grad:      5.0 mmHg LVOT Vmax:         104.00 cm/s LVOT Vmean:        73.400 cm/s LVOT VTI:          0.173 m LVOT/AV VTI ratio: 0.68  AORTA Ao Root diam: 2.90 cm MITRAL VALVE                TRICUSPID VALVE MV Area (PHT): 4.24 cm     TR Peak grad:   33.4 mmHg MV Area VTI:   2.16 cm     TR Vmax:        289.00 cm/s MV Peak grad:  7.2 mmHg MV Mean grad:  4.0 mmHg     SHUNTS MV Vmax:       1.34 m/s     Systemic VTI:  0.17 m MV Vmean:      98.0 cm/s    Systemic Diam: 2.10 cm MV Decel Time: 179 msec MV E velocity: 94.30 cm/s MV A velocity: 125.00 cm/s MV E/A ratio:  0.75 Marsa Dooms MD Electronically signed by Marsa Dooms MD Signature Date/Time: 08/29/2024/9:51:52 AM    Final    DG Chest 1 View Result Date: 08/29/2024 CLINICAL DATA:  Congestive heart failure. EXAM: CHEST  1 VIEW COMPARISON:  08/28/2024 FINDINGS: Heart size is within normal limits. Low lung volumes noted. No No evidence of focal consolidation or definite pulmonary edema. No pleural effusion. Large hiatal hernia again seen. IMPRESSION: Low lung volumes. No acute findings. Large hiatal hernia. Electronically Signed   By: Norleen DELENA Kil M.D.   On: 08/29/2024 06:06   DG Chest Portable 1 View Result Date: 08/28/2024 EXAM: 1 VIEW(S) XRAY OF THE CHEST 08/28/2024 11:03:16 AM COMPARISON: 10/26/2021 CLINICAL HISTORY: sob sob FINDINGS: LUNGS AND PLEURA: Minimal bibasilar subsegmental atelectasis or scarring is noted. No pulmonary edema. No pleural effusion. No pneumothorax. HEART AND MEDIASTINUM: No  acute abnormality of the cardiac and mediastinal silhouettes. Large hiatal hernia is again noted. BONES AND SOFT TISSUES: Status post left shoulder arthroplasty. No acute osseous abnormality. IMPRESSION: 1. No acute cardiopulmonary process. 2. Large hiatal hernia. Electronically signed by: Lynwood Seip MD 08/28/2024 11:32 AM EDT RP Workstation: [id number]    Assessment and Plan  [name] is a 78 y.o. female with medical history significant of COPD Gold stage II, chronic HFrEF on PRN Lasix , chronic hypoxic respiratory failure on 2 L continuously, HTN, IIDM, diabetic neuropathy, presented with new onset of cough wheezing shortness of breath.   Symptoms started this morning, patient woke up with new onset of productive cough with whitish phlegm and wheezing and shortness of breath denies any fever chills no chest pain.  Family called EMS, EMS arrived and found patient O2 saturation 70% on 2 L oxygen, she was placed on CPAP.  In addition, she was also given IV Solu-Medrol  DuoNebs.  Patient denied any runny nose sore throat.  Chest x-ray showed pulmonary congestion   Acute on chronic hypoxic respiratory failure Chronic home oxygen Acute COPD exacerbation Question of atypical pneumonia -Continue BiPAP support prn - IV Solu-Medrol  - ICS and LABA - DuoNebs and as needed albuterol  - Incentive spirometry - Culture sputum pending --Resp panel neg   Sepsis without acute endorgan damage - Sepsis is evidenced by tachycardia, leukocytosis, source infection as clinically suspected to be atypical pneumonia - Continue ceftriaxone  and azithromycin  - Blood culture neg --remains afebrile -- trend white count   Acute on chronic HFpEF decompensation - Signs of fluid overload, revealed her past cardiac history showed a LHC in 2023 showed mild decreased LVEF. - Continue twice daily Lasix  x 3 doses - Echocardiogram EF 60-65%   HTN - Continue metoprolol    PT to see pt TOC for d/c  planning  Procedures: Family communication :none today Consults : CODE STATUS: Full DVT Prophylaxis :lovenox  Level of care: Progressive Status is: Inpatient Remains inpatient appropriate because: COPD flare    TOTAL TIME TAKING CARE OF THIS PATIENT: 35 minutes.  >50% time spent on counselling and coordination of care  Note: This dictation was prepared with Dragon dictation along with smaller phrase technology. Any transcriptional errors that result from this process are unintentional.  [name] M.D    [facility]   CC: Primary care physician; Rudolpho Norleen BIRCH, MD

## 2024-08-29 NOTE — Evaluation (Signed)
 Occupational Therapy Evaluation Patient Details Name: [name] MRN: [id number] DOB: June 19, 1946 Today's Date: 08/29/2024   History of Present Illness   78yoF who comes to [facility] with acute on chronic exacerbation of SOB, pt on O2 chronic at home. PMH: HTN COPD, GERD, hypoTSH, despression, anemia. At baseline pt lives alone in [place] home with ramp, heavby support from family for meals, bathing, pt is primarily a household AMB with RW. Pt reports just finishing HHPT recently.     Clinical Impressions Pt was seen for OT evaluation this date. Prior to hospital admission, pt was independent with ADL and had assist from her daughter for groceries, transportation, and meds. Pt has Meals on Wheels but can do light meal prep as needed. Pt presents with deficits in activity tolerance, affecting safe and optimal ADL completion. Pt currently requires PRN assist for more exertional ADL. Pt instructed to principles of energy conservation including activity pacing, work simpflication, AE/DME, and PLB to support safety/indep. Pt verbalized understanding. Pt would benefit from skilled OT services to address noted impairments and functional limitations (see below for any additional details) in order to maximize safety and independence while minimizing future risk of falls, injury, and readmission. Anticipate the need for follow up OT services upon acute hospital DC.    If plan is discharge home, recommend the following:   A little help with walking and/or transfers;A little help with bathing/dressing/bathroom;Assist for transportation;Help with stairs or ramp for entrance;Assistance with cooking/housework     Functional Status Assessment   Patient has had a recent decline in their functional status and demonstrates the ability to make significant improvements in function in a reasonable and predictable amount of time.     Equipment Recommendations   None recommended by OT     Recommendations for  Other Services         Precautions/Restrictions   Precautions Precautions: Fall Restrictions Weight Bearing Restrictions Per Provider Order: No     Mobility Bed Mobility Overal bed mobility: Modified Independent                  Transfers Overall transfer level: Needs assistance Equipment used: Rolling walker (2 wheels) Transfers: Sit to/from Stand                    Balance Overall balance assessment: Modified Independent                                         ADL either performed or assessed with clinical judgement   ADL Overall ADL's : Needs assistance/impaired                                       General ADL Comments: Pt grossly supervision to mod indep with basic ADL     Vision         Perception         Praxis         Pertinent Vitals/Pain Pain Assessment Pain Assessment: No/denies pain     Extremity/Trunk Assessment Upper Extremity Assessment Upper Extremity Assessment: Generalized weakness   Lower Extremity Assessment Lower Extremity Assessment: Generalized weakness       Communication Communication Communication: No apparent difficulties   Cognition Arousal: Alert Behavior During Therapy: WFL for tasks assessed/performed Cognition: No apparent impairments  Following commands: Intact       Cueing  General Comments   Cueing Techniques: Verbal cues  SpO2 95% on 3L   Exercises Other Exercises Other Exercises: Pt instructed to principles of energy conservation including activity pacing, work simpflication, AE/DME, and PLB to support safety/indep   Shoulder Instructions      Home Living Family/patient expects to be discharged to:: Private residence Living Arrangements: Children (several family members help regularly.) Available Help at Discharge: Family (no longer has PCA) Type of Home: Mobile home Home Access: Ramped entrance      Home Layout: One level     Bathroom Shower/Tub: Producer, Television/film/video: (P) Handicapped height     Home Equipment: Agricultural Consultant (2 wheels);Rollator (4 wheels);BSC/3in1;Toilet riser;Tub bench;Shower seat          Prior Functioning/Environment Prior Level of Function : Needs assist             Mobility Comments: household AMB with RW ADLs Comments: pt uses meal on wheels; famiyl helps with bathing 1x weekly, pt manages her own pericare, toileting, feeding, meal prep, UBD, LBD; daughter helps with transportation, dropping off groceries, and setting up her medications in a pill box    OT Problem List: Decreased activity tolerance;Decreased knowledge of use of DME or AE   OT Treatment/Interventions: Self-care/ADL training;Therapeutic exercise;Therapeutic activities;Energy conservation;DME and/or AE instruction;Patient/family education;Balance training      OT Goals(Current goals can be found in the care plan section)   Acute Rehab OT Goals Patient Stated Goal: go home OT Goal Formulation: With patient Time For Goal Achievement: 09/12/24 Potential to Achieve Goals: Good ADL Goals Pt Will Perform Lower Body Dressing: with modified independence;sit to/from stand;with adaptive equipment Pt Will Transfer to Toilet: with modified independence;ambulating Additional ADL Goal #1: Pt will verbalize plan to implement at least 2 learned falls prevention strategies. Additional ADL Goal #2: Pt will verbalize plan to implement at least 2 learned energy conservation strategies.   OT Frequency:  Min 1X/week    Co-evaluation              AM-PAC OT 6 Clicks Daily Activity     Outcome Measure Help from another person eating meals?: None Help from another person taking care of personal grooming?: None Help from another person toileting, which includes using toliet, bedpan, or urinal?: None Help from another person bathing (including washing, rinsing, drying)?:  None Help from another person to put on and taking off regular upper body clothing?: None Help from another person to put on and taking off regular lower body clothing?: None 6 Click Score: 24   End of Session Equipment Utilized During Treatment: Oxygen Nurse Communication: Other (comment) (pt requesting laxative and barrier cream)  Activity Tolerance: Patient tolerated treatment well Patient left: in bed;with call bell/phone within reach;with bed alarm set  OT Visit Diagnosis: Other abnormalities of gait and mobility (R26.89);Muscle weakness (generalized) (M62.81)                Time: 8496-8476 OT Time Calculation (min): 20 min Charges:  OT General Charges $OT Visit: 1 Visit OT Evaluation $OT Eval Low Complexity: 1 Low OT Treatments $Self Care/Home Management : 8-22 mins  Warren SAUNDERS., MPH, MS, OTR/L ascom [phone] 08/29/24, 4:19 PM

## 2024-08-29 NOTE — Evaluation (Signed)
 Physical Therapy Evaluation Patient Details Name: [name] MRN: [id number] DOB: 1946-08-24 Today's Date: 08/29/2024  History of Present Illness  78yoF who comes [facility] with acute on chronic exacerbation of SOB, pt on O2 chronic at home. PMH: HTN COPD, GERD, hypoTSH, despression, anemia. At baseline pt lives alone in [place] home with ramp, heavby support from family for meals, bathing, pt is primarily a household AMB with RW. Pt reports just finishing HHPT recently.  Clinical Impression  Pt awake on entry, on 3L/min O2, sats at 95%, pt reporting mobility in room earlier to BR with NSG, now fatigued. Details gathered from patient and also from RN. Pt also able to demonstrate bed mobility and transfers, both of which are near baseline for independence level, balance, and strength, but limited by fatigue and acute DOE. Pt feels that once her breathing is improved she will not need any PT at home, but she may need some additional family support should she DC in her current state. Family sounds fairly involved at baseline and pt reports they likely could increase their degree of support. Will continue to follow while admitted to help avoid any deconditioning associated from acute admission and loss of baseline mobility habits.       If plan is discharge home, recommend the following: Help with stairs or ramp for entrance;A little help with bathing/dressing/bathroom;Assistance with cooking/housework;Assist for transportation   Can travel by private vehicle        Equipment Recommendations None recommended by PT  Recommendations for Other Services       Functional Status Assessment Patient has had a recent decline in their functional status and demonstrates the ability to make significant improvements in function in a reasonable and predictable amount of time.     Precautions / Restrictions Precautions Precautions: Fall Restrictions Weight Bearing Restrictions Per Provider Order: No       Mobility  Bed Mobility Overal bed mobility: Modified Independent Bed Mobility: Supine to Sit     Supine to sit: Modified independent (Device/Increase time), Used rails, HOB elevated     General bed mobility comments: similar to home performance, adequate simulation.    Transfers Overall transfer level: Needs assistance Equipment used: Rolling walker (2 wheels) Transfers: Sit to/from Stand Sit to Stand: Supervision           General transfer comment: no LOB; stands twice ad lib, safe use of RW    Ambulation/Gait               General Gait Details: deferred 2/2 to fatigue but pt reports AMB to/from BR with RN earlier, no worn out; ~1ft each way.  Stairs            Wheelchair Mobility     Tilt Bed    Modified Rankin (Stroke Patients Only)       Balance Overall balance assessment: Modified Independent                                           Pertinent Vitals/Pain Pain Assessment Pain Assessment: No/denies pain    Home Living Family/patient expects to be discharged to:: Private residence Living Arrangements: Children (several family members help regularly.) Available Help at Discharge: Family (no longer has PCA) Type of Home: Mobile home Home Access: Ramped entrance       Home Layout: One level Home Equipment: Agricultural Consultant (2 wheels);Rollator (4  wheels);BSC/3in1;Toilet riser;Tub bench      Prior Function Prior Level of Function : Needs assist             Mobility Comments: household AMB with RW ADLs Comments: pt uses meal on wheels; famiyl helps with bathing 1x weekly, pt manages her own pericare, toiletting, feeding, meal prep, UBD, LBD     Extremity/Trunk Assessment                Communication        Cognition Arousal: Alert Behavior During Therapy: WFL for tasks assessed/performed   PT - Cognitive impairments: No apparent impairments                                 Cueing        General Comments      Exercises     Assessment/Plan    PT Assessment Patient needs continued PT services  PT Problem List Decreased strength;Decreased range of motion;Decreased activity tolerance;Decreased balance;Decreased mobility;Decreased knowledge of use of DME;Decreased safety awareness;Decreased knowledge of precautions       PT Treatment Interventions Functional mobility training;Therapeutic activities;Therapeutic exercise;Patient/family education    PT Goals (Current goals can be found in the Care Plan section)  Acute Rehab PT Goals Patient Stated Goal: return to home with improved breathing PT Goal Formulation: With patient Time For Goal Achievement: 09/12/24 Potential to Achieve Goals: Good    Frequency Min 2X/week     Co-evaluation               AM-PAC PT 6 Clicks Mobility  Outcome Measure Help needed turning from your back to your side while in a flat bed without using bedrails?: A Little Help needed moving from lying on your back to sitting on the side of a flat bed without using bedrails?: A Little Help needed moving to and from a bed to a chair (including a wheelchair)?: A Little Help needed standing up from a chair using your arms (e.g., wheelchair or bedside chair)?: A Little Help needed to walk in hospital room?: A Little Help needed climbing 3-5 steps with a railing? : A Lot 6 Click Score: 17    End of Session Equipment Utilized During Treatment: Oxygen Activity Tolerance: Patient tolerated treatment well;No increased pain;Patient limited by fatigue Patient left: in bed;with nursing/sitter in room;with call bell/phone within reach Nurse Communication: Mobility status PT Visit Diagnosis: Difficulty in walking, not elsewhere classified (R26.2);Unsteadiness on feet (R26.81);Other abnormalities of gait and mobility (R26.89);Muscle weakness (generalized) (M62.81)    Time: 8553-8497 PT Time Calculation (min) (ACUTE ONLY): 16 min   Charges:    PT Evaluation $PT Eval Moderate Complexity: 1 Mod   PT General Charges $$ ACUTE PT VISIT: 1 Visit        3:22 PM, 08/29/24 Peggye JAYSON Linear, PT, DPT Physical Therapist - [facility]  [phone] (ASCOM)   Garth Diffley C 08/29/2024, 3:19 PM

## 2024-08-30 DIAGNOSIS — J441 Chronic obstructive pulmonary disease with (acute) exacerbation: Secondary | ICD-10-CM | POA: Diagnosis not present

## 2024-08-30 DIAGNOSIS — J189 Pneumonia, unspecified organism: Secondary | ICD-10-CM | POA: Diagnosis not present

## 2024-08-30 NOTE — Progress Notes (Signed)
 [facility]  - [place] at [facility]   PATIENT NAME: [name]    MR#:  [id number]  DATE OF BIRTH:  05-18-1946  SUBJECTIVE:  no family at bedside came in with increasing shortness of breath. Trying to eat lunch. States breathing improved. Wears about 3liters nasal cannula oxygen. Ambulated using walker. Denies any cough with phlegm. Has some dry cough Feels better today. Would like to stay one more day    VITALS:  Blood pressure 124/73, pulse 78, temperature 98.4 F (36.9 C), resp. rate 17, height 4' 10 (1.473 m), weight 80.4 kg, SpO2 93%.  PHYSICAL EXAMINATION:   GENERAL:  78 y.o.-year-old patient with no acute distress. Morbidly obese LUNGS: decreased breath sounds bilaterally, no wheezing CARDIOVASCULAR: S1, S2 normal. No murmur   ABDOMEN: Soft, nontender, nondistended. Bowel sounds present.  EXTREMITIES: No  edema b/l.    NEUROLOGIC: nonfocal  patient is alert and awake SKIN:  Wound 08/28/24 1531 Pressure Injury Buttocks Left Deep Tissue Pressure Injury - Purple or maroon localized area of discolored intact skin or blood-filled blister due to damage of underlying soft tissue from pressure and/or shear. (Active)     Wound 08/28/24 1200 Pressure Injury Buttocks Right Deep Tissue Pressure Injury - Purple or maroon localized area of discolored intact skin or blood-filled blister due to damage of underlying soft tissue from pressure and/or shear. (Active)      LABORATORY PANEL:  CBC Recent Labs  Lab 08/29/24 0413  WBC 20.5*  HGB 9.3*  HCT 29.2*  PLT 311    Chemistries  Recent Labs  Lab 08/28/24 1021 08/29/24 0413  NA 136 138  K 3.7 3.2*  CL 97* 97*  CO2 25 29  GLUCOSE 140* 127*  BUN 17 16  CREATININE 0.47 0.46  CALCIUM 9.2 8.2*  AST 26  --   ALT 18  --   ALKPHOS 65  --   BILITOT 0.6  --    Cardiac Enzymes No results for input(s): TROPONINI in the last 168 hours. RADIOLOGY:  ECHOCARDIOGRAM COMPLETE Result Date: 08/29/2024     ECHOCARDIOGRAM REPORT   Patient Name:   [name] Date of Exam: 08/29/2024 Medical Rec #:  [id number]     Height:       58.0 in Accession #:    [id number]    Weight:       182.0 lb Date of Birth:  July 26, 1946    BSA:          1.750 m Patient Age:    78 years      BP:           109/68 mmHg Patient Gender: F             HR:           84 bpm. Exam Location:  [facility] Procedure: 2D Echo, Cardiac Doppler and Color Doppler (Both Spectral and Color            Flow Doppler were utilized during procedure). Indications:     CHF-acute systolic I50.21  History:         Patient has no prior history of Echocardiogram examinations.                  COPD; Signs/Symptoms:Murmur.  Sonographer:     Christopher Furnace Referring Phys:  [id number] CORT ONEIDA MANA Diagnosing Phys: Marsa Dooms MD IMPRESSIONS  1. Left ventricular ejection fraction, by estimation, is 60 to 65%. The left ventricle has normal function. The  left ventricle has no regional wall motion abnormalities. Left ventricular diastolic parameters are consistent with Grade I diastolic dysfunction (impaired relaxation).  2. Right ventricular systolic function is normal. The right ventricular size is normal.  3. The mitral valve is normal in structure. Mild mitral valve regurgitation. No evidence of mitral stenosis.  4. The aortic valve is normal in structure. Aortic valve regurgitation is not visualized. No aortic stenosis is present.  5. The inferior vena cava is normal in size with greater than 50% respiratory variability, suggesting right atrial pressure of 3 mmHg. FINDINGS  Left Ventricle: Left ventricular ejection fraction, by estimation, is 60 to 65%. The left ventricle has normal function. The left ventricle has no regional wall motion abnormalities. Strain was performed and the global longitudinal strain is indeterminate. The left ventricular internal cavity size was normal in size. There is no left ventricular hypertrophy. Left ventricular diastolic parameters are  consistent with Grade I diastolic dysfunction (impaired relaxation). Right Ventricle: The right ventricular size is normal. No increase in right ventricular wall thickness. Right ventricular systolic function is normal. Left Atrium: Left atrial size was normal in size. Right Atrium: Right atrial size was normal in size. Pericardium: There is no evidence of pericardial effusion. Mitral Valve: The mitral valve is normal in structure. Mild mitral valve regurgitation. No evidence of mitral valve stenosis. MV peak gradient, 7.2 mmHg. The mean mitral valve gradient is 4.0 mmHg. Tricuspid Valve: The tricuspid valve is normal in structure. Tricuspid valve regurgitation is mild . No evidence of tricuspid stenosis. Aortic Valve: The aortic valve is normal in structure. Aortic valve regurgitation is not visualized. No aortic stenosis is present. Aortic valve mean gradient measures 5.0 mmHg. Aortic valve peak gradient measures 7.5 mmHg. Aortic valve area, by VTI measures 2.36 cm. Pulmonic Valve: The pulmonic valve was normal in structure. Pulmonic valve regurgitation is not visualized. No evidence of pulmonic stenosis. Aorta: The aortic root is normal in size and structure. Venous: The inferior vena cava is normal in size with greater than 50% respiratory variability, suggesting right atrial pressure of 3 mmHg. IAS/Shunts: No atrial level shunt detected by color flow Doppler. Additional Comments: 3D was performed not requiring image post processing on an independent workstation and was indeterminate.  LEFT VENTRICLE PLAX 2D LVIDd:         4.30 cm   Diastology LVIDs:         2.70 cm   LV e' medial:    6.74 cm/s LV PW:         1.20 cm   LV E/e' medial:  14.0 LV IVS:        1.00 cm   LV e' lateral:   10.00 cm/s LVOT diam:     2.10 cm   LV E/e' lateral: 9.4 LV SV:         60 LV SV Index:   34 LVOT Area:     3.46 cm  RIGHT VENTRICLE RV Basal diam:  3.80 cm RV Mid diam:    2.90 cm RV S prime:     16.90 cm/s TAPSE (M-mode): 1.2 cm  LEFT ATRIUM             Index        RIGHT ATRIUM           Index LA diam:        2.70 cm 1.54 cm/m   RA Area:     15.70 cm LA Vol (A2C):   39.7 ml 22.69  ml/m  RA Volume:   41.20 ml  23.55 ml/m LA Vol (A4C):   25.0 ml 14.29 ml/m LA Biplane Vol: 31.7 ml 18.12 ml/m  AORTIC VALVE AV Area (Vmax):    2.63 cm AV Area (Vmean):   2.55 cm AV Area (VTI):     2.36 cm AV Vmax:           137.00 cm/s AV Vmean:          99.600 cm/s AV VTI:            0.254 m AV Peak Grad:      7.5 mmHg AV Mean Grad:      5.0 mmHg LVOT Vmax:         104.00 cm/s LVOT Vmean:        73.400 cm/s LVOT VTI:          0.173 m LVOT/AV VTI ratio: 0.68  AORTA Ao Root diam: 2.90 cm MITRAL VALVE                TRICUSPID VALVE MV Area (PHT): 4.24 cm     TR Peak grad:   33.4 mmHg MV Area VTI:   2.16 cm     TR Vmax:        289.00 cm/s MV Peak grad:  7.2 mmHg MV Mean grad:  4.0 mmHg     SHUNTS MV Vmax:       1.34 m/s     Systemic VTI:  0.17 m MV Vmean:      98.0 cm/s    Systemic Diam: 2.10 cm MV Decel Time: 179 msec MV E velocity: 94.30 cm/s MV A velocity: 125.00 cm/s MV E/A ratio:  0.75 Marsa Dooms MD Electronically signed by Marsa Dooms MD Signature Date/Time: 08/29/2024/9:51:52 AM    Final    DG Chest 1 View Result Date: 08/29/2024 CLINICAL DATA:  Congestive heart failure. EXAM: CHEST  1 VIEW COMPARISON:  08/28/2024 FINDINGS: Heart size is within normal limits. Low lung volumes noted. No No evidence of focal consolidation or definite pulmonary edema. No pleural effusion. Large hiatal hernia again seen. IMPRESSION: Low lung volumes. No acute findings. Large hiatal hernia. Electronically Signed   By: Norleen DELENA Kil M.D.   On: 08/29/2024 06:06    Assessment and Plan  CUMI SANAGUSTIN is a 78 y.o. female with medical history significant of COPD Gold stage II, chronic HFrEF on PRN Lasix , chronic hypoxic respiratory failure on 2 L continuously, HTN, IIDM, diabetic neuropathy, presented with new onset of cough wheezing shortness of  breath.   Symptoms started this morning, patient woke up with new onset of productive cough with whitish phlegm and wheezing and shortness of breath denies any fever chills no chest pain.  Family called EMS, EMS arrived and found patient O2 saturation 70% on 2 L oxygen, she was placed on CPAP.  In addition, she was also given IV Solu-Medrol  DuoNebs.  Patient denied any runny nose sore throat.  Chest x-ray showed pulmonary congestion   Acute on chronic hypoxic respiratory failure Chronic home oxygen Acute COPD exacerbation Question of atypical pneumonia -Continue BiPAP support prn - IV Solu-Medrol  - ICS and LABA - DuoNebs and as needed albuterol  - Incentive spirometry - Culture sputum pending --Resp panel neg   Sepsis without acute endorgan damage - Sepsis is evidenced by tachycardia, leukocytosis, source infection as clinically suspected to be atypical pneumonia - Continue ceftriaxone  and azithromycin  - Blood culture neg --remains afebrile -- trend white count   Acute  on chronic HFpEF decompensation - Signs of fluid overload, revealed her past cardiac history showed a LHC in 2023 showed mild decreased LVEF. - Continue twice daily Lasix  x 3 doses - Echocardiogram EF 60-65%   HTN - Continue metoprolol    PT to see pt--HHPT TOC for d/c planning.   Procedures: Family communication :none today Consults : CODE STATUS: Full DVT Prophylaxis :lovenox  Level of care: Progressive Status is: Inpatient Remains inpatient appropriate because: COPD flare    TOTAL TIME TAKING CARE OF THIS PATIENT: 35 minutes.  >50% time spent on counselling and coordination of care  Note: This dictation was prepared with Dragon dictation along with smaller phrase technology. Any transcriptional errors that result from this process are unintentional.  Leita Blanch M.D    [facility]   CC: Primary care physician; Rudolpho Norleen BIRCH, MD

## 2024-08-31 DIAGNOSIS — J441 Chronic obstructive pulmonary disease with (acute) exacerbation: Secondary | ICD-10-CM | POA: Diagnosis not present

## 2024-08-31 LAB — CBC
HCT: 34.1 % — ABNORMAL LOW (ref 36.0–46.0)
Hemoglobin: 10.6 g/dL — ABNORMAL LOW (ref 12.0–15.0)
MCH: 25 pg — ABNORMAL LOW (ref 26.0–34.0)
MCHC: 31.1 g/dL (ref 30.0–36.0)
MCV: 80.4 fL (ref 80.0–100.0)
Platelets: 399 K/uL (ref 150–400)
RBC: 4.24 MIL/uL (ref 3.87–5.11)
RDW: 16.1 % — ABNORMAL HIGH (ref 11.5–15.5)
WBC: 14.5 K/uL — ABNORMAL HIGH (ref 4.0–10.5)
nRBC: 0 % (ref 0.0–0.2)

## 2024-08-31 MED ORDER — FLEET ENEMA RE ENEM
1.0000 | ENEMA | Freq: Every day | RECTAL | Status: DC | PRN
  Administered 2024-08-31: 1 via RECTAL

## 2024-08-31 MED ORDER — CEFUROXIME AXETIL 500 MG PO TABS
500.0000 mg | ORAL_TABLET | Freq: Two times a day (BID) | ORAL | Status: DC
  Administered 2024-08-31: 500 mg via ORAL
  Filled 2024-08-31 (×2): qty 1

## 2024-08-31 MED ORDER — CEFUROXIME AXETIL 500 MG PO TABS: 500.0000 mg | ORAL_TABLET | Freq: Two times a day (BID) | ORAL | 0 refills | Status: AC

## 2024-08-31 MED ORDER — PREDNISONE 20 MG PO TABS: 40.0000 mg | ORAL_TABLET | Freq: Every day | ORAL | 0 refills | Status: AC

## 2024-08-31 MED ORDER — BISACODYL 10 MG RE SUPP
10.0000 mg | Freq: Every day | RECTAL | Status: DC | PRN
Start: 2024-08-31 — End: 2024-08-31

## 2024-08-31 MED ORDER — AZITHROMYCIN 250 MG PO TABS: ORAL_TABLET | ORAL | 0 refills | Status: AC

## 2024-08-31 NOTE — Discharge Summary (Signed)
 Physician Discharge Summary   Patient: [name] MRN: [id number] DOB: 03/17/1946  Admit date:     08/28/2024  Discharge date: 08/31/24  Discharge Physician: Leita Blanch   PCP: Rudolpho Norleen BIRCH, MD   Recommendations at discharge:    F/u PCP in 1-2 weeks F/u Pulm dr Parris in 1-2 weeks use your oxygen, inhaler, nebulizer as before  Discharge Diagnoses: Active Problems:   Community acquired pneumonia   COPD with acute exacerbation (HCC)   COPD exacerbation (HCC)  [name] is a 78 y.o. female with medical history significant of COPD Gold stage II, chronic HFrEF on PRN Lasix , chronic hypoxic respiratory failure on 2 L continuously, HTN, IIDM, diabetic neuropathy, presented with new onset of cough wheezing shortness of breath.   Symptoms started this morning, patient woke up with new onset of productive cough with whitish phlegm and wheezing and shortness of breath denies any fever chills no chest pain.  Family called EMS, EMS arrived and found patient O2 saturation 70% on 2 L oxygen, she was placed on CPAP.  In addition, she was also given IV Solu-Medrol  DuoNebs.  Patient denied any runny nose sore throat.   Chest x-ray showed pulmonary congestion    Acute on chronic hypoxic respiratory failure Chronic home oxygen Acute COPD exacerbation Question of atypical pneumonia -Continue BiPAP support prn - IV Solu-Medrol  - ICS and LABA - DuoNebs and as needed albuterol  - Incentive spirometry - Culture sputum pending --Resp panel neg -- patient afebrile overall doing well near baseline.   Sepsis without acute endorgan damage - Sepsis is evidenced by tachycardia, leukocytosis, source infection as clinically suspected to be atypical pneumonia - Continue ceftriaxone  and azithromycin  - Blood culture neg --remains afebrile -- trend white count--20K --14k  Acute on chronic HFpEF decompensation - Signs of fluid overload, revealed her past cardiac history showed a LHC in 2023  showed mild decreased LVEF. - Continue twice daily Lasix  x 3 doses--now back on prn lasix  at home - Echocardiogram EF 60-65%   HTN - Continue metoprolol     PT to see pt--HHPT TOC for d/c planning. Patient improving. Will discharge to home. Agreeable. Discussed with daughter Tobias on the phone     Procedures: Family communication : daughter Tobias  consults : CODE STATUS: Full DVT Prophylaxis :lovenox      Pain control - [place]  Controlled Substance Reporting System database was reviewed. and patient was instructed, not to drive, operate heavy machinery, perform activities at heights, swimming or participation in water activities or provide baby-sitting services while on Pain, Sleep and Anxiety Medications; until their outpatient Physician has advised to do so again. Also recommended to not to take more than prescribed Pain, Sleep and Anxiety Medications.   Disposition: Home with HH Diet recommendation:  Discharge Diet Orders (From admission, onward)     Start     Ordered   08/31/24 0000  Diet - low sodium heart healthy        08/31/24 1101           Cardiac diet DISCHARGE MEDICATION: Allergies as of 08/31/2024       Reactions   Levofloxacin  Shortness Of Breath   Adhesive [tape] Other (See Comments)   Other Rash   Plastic tape        Medication List     TAKE these medications    albuterol  108 (90 Base) MCG/ACT inhaler Commonly known as: VENTOLIN  HFA Inhale 1-2 puffs into the lungs every 6 (six) hours as needed for  wheezing or shortness of breath.   amLODipine  5 MG tablet Commonly known as: NORVASC  Take 1 tablet by mouth daily.   azithromycin  250 MG tablet Commonly known as: ZITHROMAX  Take as directed Start taking on: September 01, 2024   cefUROXime  500 MG tablet Commonly known as: CEFTIN  Take 1 tablet (500 mg total) by mouth 2 (two) times daily with a meal for 4 days.   furosemide  20 MG tablet Commonly known as: LASIX  Take 20 mg by mouth daily  as needed.   gabapentin  100 MG capsule Commonly known as: NEURONTIN  Take 2 capsules (200 mg total) by mouth at bedtime.   levothyroxine  112 MCG tablet Commonly known as: SYNTHROID  Take 112 mcg by mouth daily before breakfast.   metoprolol  tartrate 100 MG tablet Commonly known as: LOPRESSOR  Take 100 mg by mouth 2 (two) times daily.   multivitamin tablet Take 1 tablet by mouth daily. Reported on 04/05/2016   omeprazole 20 MG capsule Commonly known as: PRILOSEC Take 20 mg by mouth 2 (two) times daily.   oxybutynin 5 MG 24 hr tablet Commonly known as: DITROPAN-XL Take 5 mg by mouth daily.   OXYGEN Inhale 4 L into the lungs continuous.   potassium chloride  SA 20 MEQ tablet Commonly known as: KLOR-CON  M Take 20 mEq by mouth daily.   predniSONE  20 MG tablet Commonly known as: DELTASONE  Take 2 tablets (40 mg total) by mouth daily with breakfast for 3 days. Start taking on: September 01, 2024   sertraline 100 MG tablet Commonly known as: ZOLOFT Take 100 mg by mouth daily.   Trelegy Ellipta  100-62.5-25 MCG/ACT Aepb Generic drug: Fluticasone -Umeclidin-Vilant Inhale 1 puff into the lungs daily.   triamcinolone  cream 0.5 % Commonly known as: KENALOG  Apply 1 Application topically 2 (two) times daily.   Vitamin D 50 MCG (2000 UT) tablet Take 2,000 Units by mouth daily.               Discharge Care Instructions  (From admission, onward)           Start     Ordered   08/31/24 0000  Discharge wound care:       Comments: 08/29/24 1041    Wound care  Every shift      Comments: R/L buttocks: cleanse skin with Vashe (lawson # [id number]), allow to air dry.  Place Xeroform over wound bed and cover with silicone foam dressing.  Change daily.  08/29/24 1041   08/31/24 1101            Follow-up Information     Rudolpho Norleen BIRCH, MD. Schedule an appointment as soon as possible for a visit in 1 week(s).   Specialty: Internal Medicine Contact information: [address] [facility] [place] [address] [phone]         Parris Manna, MD. Schedule an appointment as soon as possible for a visit in 10 day(s).   Specialty: Pulmonary Disease Why: copd f/u Contact information: [address] [place] [address] [phone]                Discharge Exam: Fredricka Weights   08/30/24 0500  Weight: 80.4 kg   GENERAL:  78 y.o.-year-old patient with no acute distress. Morbidly obese LUNGS: decreased breath sounds bilaterally, no wheezing CARDIOVASCULAR: S1, S2 normal. No murmur   ABDOMEN: Soft, nontender, nondistended. Bowel sounds present.  EXTREMITIES: No  edema b/l.    NEUROLOGIC: nonfocal  patient is alert and awake SKIN:  Wound 08/28/24  1531 Pressure Injury Buttocks Left Deep Tissue Pressure Injury - Purple or maroon localized area of discolored intact skin or blood-filled blister due to damage of underlying soft tissue from pressure and/or shear. (Active)     Wound 08/28/24 1200 Pressure Injury Buttocks Right Deep Tissue Pressure Injury - Purple or maroon localized area of discolored intact skin or blood-filled blister due to damage of underlying soft tissue from pressure and/or shear. (Active)     Condition at discharge: fair  The results of significant diagnostics from this hospitalization (including imaging, microbiology, ancillary and laboratory) are listed below for reference.   Imaging Studies: ECHOCARDIOGRAM COMPLETE Result Date: 08/29/2024    ECHOCARDIOGRAM REPORT   Patient Name:   NADJA LINA Abila Date of Exam: 08/29/2024 Medical Rec #:  [id number]     Height:       58.0 in Accession #:    [id number]    Weight:       182.0 lb Date of Birth:  [birth year]    BSA:          1.750 m Patient Age:    78 years      BP:           109/68 mmHg Patient Gender: F             HR:           84 bpm. Exam Location:  [facility] Procedure: 2D Echo, Cardiac Doppler and Color Doppler (Both Spectral and Color            Flow Doppler were  utilized during procedure). Indications:     CHF-acute systolic I50.21  History:         Patient has no prior history of Echocardiogram examinations.                  COPD; Signs/Symptoms:Murmur.  Sonographer:     Christopher Furnace Referring Phys:  [id number] CORT ONEIDA MANA Diagnosing Phys: Marsa Dooms MD IMPRESSIONS  1. Left ventricular ejection fraction, by estimation, is 60 to 65%. The left ventricle has normal function. The left ventricle has no regional wall motion abnormalities. Left ventricular diastolic parameters are consistent with Grade I diastolic dysfunction (impaired relaxation).  2. Right ventricular systolic function is normal. The right ventricular size is normal.  3. The mitral valve is normal in structure. Mild mitral valve regurgitation. No evidence of mitral stenosis.  4. The aortic valve is normal in structure. Aortic valve regurgitation is not visualized. No aortic stenosis is present.  5. The inferior vena cava is normal in size with greater than 50% respiratory variability, suggesting right atrial pressure of 3 mmHg. FINDINGS  Left Ventricle: Left ventricular ejection fraction, by estimation, is 60 to 65%. The left ventricle has normal function. The left ventricle has no regional wall motion abnormalities. Strain was performed and the global longitudinal strain is indeterminate. The left ventricular internal cavity size was normal in size. There is no left ventricular hypertrophy. Left ventricular diastolic parameters are consistent with Grade I diastolic dysfunction (impaired relaxation). Right Ventricle: The right ventricular size is normal. No increase in right ventricular wall thickness. Right ventricular systolic function is normal. Left Atrium: Left atrial size was normal in size. Right Atrium: Right atrial size was normal in size. Pericardium: There is no evidence of pericardial effusion. Mitral Valve: The mitral valve is normal in structure. Mild mitral valve regurgitation. No evidence  of mitral valve stenosis. MV peak gradient, 7.2 mmHg. The mean mitral valve gradient is 4.0  mmHg. Tricuspid Valve: The tricuspid valve is normal in structure. Tricuspid valve regurgitation is mild . No evidence of tricuspid stenosis. Aortic Valve: The aortic valve is normal in structure. Aortic valve regurgitation is not visualized. No aortic stenosis is present. Aortic valve mean gradient measures 5.0 mmHg. Aortic valve peak gradient measures 7.5 mmHg. Aortic valve area, by VTI measures 2.36 cm. Pulmonic Valve: The pulmonic valve was normal in structure. Pulmonic valve regurgitation is not visualized. No evidence of pulmonic stenosis. Aorta: The aortic root is normal in size and structure. Venous: The inferior vena cava is normal in size with greater than 50% respiratory variability, suggesting right atrial pressure of 3 mmHg. IAS/Shunts: No atrial level shunt detected by color flow Doppler. Additional Comments: 3D was performed not requiring image post processing on an independent workstation and was indeterminate.  LEFT VENTRICLE PLAX 2D LVIDd:         4.30 cm   Diastology LVIDs:         2.70 cm   LV e' medial:    6.74 cm/s LV PW:         1.20 cm   LV E/e' medial:  14.0 LV IVS:        1.00 cm   LV e' lateral:   10.00 cm/s LVOT diam:     2.10 cm   LV E/e' lateral: 9.4 LV SV:         60 LV SV Index:   34 LVOT Area:     3.46 cm  RIGHT VENTRICLE RV Basal diam:  3.80 cm RV Mid diam:    2.90 cm RV S prime:     16.90 cm/s TAPSE (M-mode): 1.2 cm LEFT ATRIUM             Index        RIGHT ATRIUM           Index LA diam:        2.70 cm 1.54 cm/m   RA Area:     15.70 cm LA Vol (A2C):   39.7 ml 22.69 ml/m  RA Volume:   41.20 ml  23.55 ml/m LA Vol (A4C):   25.0 ml 14.29 ml/m LA Biplane Vol: 31.7 ml 18.12 ml/m  AORTIC VALVE AV Area (Vmax):    2.63 cm AV Area (Vmean):   2.55 cm AV Area (VTI):     2.36 cm AV Vmax:           137.00 cm/s AV Vmean:          99.600 cm/s AV VTI:            0.254 m AV Peak Grad:      7.5  mmHg AV Mean Grad:      5.0 mmHg LVOT Vmax:         104.00 cm/s LVOT Vmean:        73.400 cm/s LVOT VTI:          0.173 m LVOT/AV VTI ratio: 0.68  AORTA Ao Root diam: 2.90 cm MITRAL VALVE                TRICUSPID VALVE MV Area (PHT): 4.24 cm     TR Peak grad:   33.4 mmHg MV Area VTI:   2.16 cm     TR Vmax:        289.00 cm/s MV Peak grad:  7.2 mmHg MV Mean grad:  4.0 mmHg     SHUNTS MV Vmax:  1.34 m/s     Systemic VTI:  0.17 m MV Vmean:      98.0 cm/s    Systemic Diam: 2.10 cm MV Decel Time: 179 msec MV E velocity: 94.30 cm/s MV A velocity: 125.00 cm/s MV E/A ratio:  0.75 Marsa Dooms MD Electronically signed by Marsa Dooms MD Signature Date/Time: 08/29/2024/9:51:52 AM    Final    DG Chest 1 View Result Date: 08/29/2024 CLINICAL DATA:  Congestive heart failure. EXAM: CHEST  1 VIEW COMPARISON:  08/28/2024 FINDINGS: Heart size is within normal limits. Low lung volumes noted. No No evidence of focal consolidation or definite pulmonary edema. No pleural effusion. Large hiatal hernia again seen. IMPRESSION: Low lung volumes. No acute findings. Large hiatal hernia. Electronically Signed   By: Norleen DELENA Kil M.D.   On: 08/29/2024 06:06   DG Chest Portable 1 View Result Date: 08/28/2024 EXAM: 1 VIEW(S) XRAY OF THE CHEST 08/28/2024 11:03:16 AM COMPARISON: 10/26/2021 CLINICAL HISTORY: sob sob FINDINGS: LUNGS AND PLEURA: Minimal bibasilar subsegmental atelectasis or scarring is noted. No pulmonary edema. No pleural effusion. No pneumothorax. HEART AND MEDIASTINUM: No acute abnormality of the cardiac and mediastinal silhouettes. Large hiatal hernia is again noted. BONES AND SOFT TISSUES: Status post left shoulder arthroplasty. No acute osseous abnormality. IMPRESSION: 1. No acute cardiopulmonary process. 2. Large hiatal hernia. Electronically signed by: Lynwood Seip MD 08/28/2024 11:32 AM EDT RP Workstation: [id number]    Microbiology: Results for orders placed or performed during the hospital  encounter of 08/28/24  Resp panel by RT-PCR (RSV, Flu A&B, Covid) Anterior Nasal Swab     Status: None   Collection Time: 08/28/24 11:12 AM   Specimen: Anterior Nasal Swab  Result Value Ref Range Status   SARS Coronavirus 2 by RT PCR NEGATIVE NEGATIVE Final    Comment: (NOTE) SARS-CoV-2 target nucleic acids are NOT DETECTED.  The SARS-CoV-2 RNA is generally detectable in upper respiratory specimens during the acute phase of infection. The lowest concentration of SARS-CoV-2 viral copies this assay can detect is 138 copies/mL. A negative result does not preclude SARS-Cov-2 infection and should not be used as the sole basis for treatment or other patient management decisions. A negative result may occur with  improper specimen collection/handling, submission of specimen other than nasopharyngeal swab, presence of viral mutation(s) within the areas targeted by this assay, and inadequate number of viral copies(<138 copies/mL). A negative result must be combined with clinical observations, patient history, and epidemiological information. The expected result is Negative.  Fact Sheet for Patients:  bloggercourse.com  Fact Sheet for Healthcare Providers:  seriousbroker.it  This test is no t yet approved or cleared by the [place]  FDA and  has been authorized for detection and/or diagnosis of SARS-CoV-2 by FDA under an Emergency Use Authorization (EUA). This EUA will remain  in effect (meaning this test can be used) for the duration of the COVID-19 declaration under Section 564(b)(1) of the Act, 21 U.S.C.section 360bbb-3(b)(1), unless the authorization is terminated  or revoked sooner.       Influenza A by PCR NEGATIVE NEGATIVE Final   Influenza B by PCR NEGATIVE NEGATIVE Final    Comment: (NOTE) The Xpert Xpress SARS-CoV-2/FLU/RSV plus assay is intended as an aid in the diagnosis of influenza from Nasopharyngeal swab specimens  and should not be used as a sole basis for treatment. Nasal washings and aspirates are unacceptable for Xpert Xpress SARS-CoV-2/FLU/RSV testing.  Fact Sheet for Patients: bloggercourse.com  Fact Sheet for Healthcare Providers: seriousbroker.it  This test  is not yet approved or cleared by the [place]  FDA and has been authorized for detection and/or diagnosis of SARS-CoV-2 by FDA under an Emergency Use Authorization (EUA). This EUA will remain in effect (meaning this test can be used) for the duration of the COVID-19 declaration under Section 564(b)(1) of the Act, 21 U.S.C. section 360bbb-3(b)(1), unless the authorization is terminated or revoked.     Resp Syncytial Virus by PCR NEGATIVE NEGATIVE Final    Comment: (NOTE) Fact Sheet for Patients: bloggercourse.com  Fact Sheet for Healthcare Providers: seriousbroker.it  This test is not yet approved or cleared by the [place]  FDA and has been authorized for detection and/or diagnosis of SARS-CoV-2 by FDA under an Emergency Use Authorization (EUA). This EUA will remain in effect (meaning this test can be used) for the duration of the COVID-19 declaration under Section 564(b)(1) of the Act, 21 U.S.C. section 360bbb-3(b)(1), unless the authorization is terminated or revoked.  Performed at [facility], [address] [place], [place] [address]   Blood culture (routine x 2)     Status: None (Preliminary result)   Collection Time: 08/28/24 11:12 AM   Specimen: BLOOD  Result Value Ref Range Status   Specimen Description BLOOD RIGHT ANTECUBITAL  Final   Special Requests   Final    BOTTLES DRAWN AEROBIC AND ANAEROBIC Blood Culture results may not be optimal due to an inadequate volume of blood received in culture bottles   Culture   Final    NO GROWTH 3 DAYS Performed at [facility], [address] [place], [place] [address]    Report Status PENDING  Incomplete  Blood culture (routine x 2)     Status: None (Preliminary result)   Collection Time: 08/28/24 11:12 AM   Specimen: BLOOD  Result Value Ref Range Status   Specimen Description BLOOD BLOOD LEFT HAND  Final   Special Requests   Final    BOTTLES DRAWN AEROBIC AND ANAEROBIC Blood Culture results may not be optimal due to an inadequate volume of blood received in culture bottles   Culture   Final    NO GROWTH 3 DAYS Performed at [facility], [address] [place], [place] [address]    Report Status PENDING  Incomplete    Labs: CBC: Recent Labs  Lab 08/28/24 1021 08/29/24 0413 08/31/24 0949  WBC 17.1* 20.5* 14.5*  HGB 11.1* 9.3* 10.6*  HCT 35.0* 29.2* 34.1*  MCV 81.0 78.3* 80.4  PLT 390 311 399   Basic Metabolic Panel: Recent Labs  Lab 08/28/24 1021 08/29/24 0413  NA 136 138  K 3.7 3.2*  CL 97* 97*  CO2 25 29  GLUCOSE 140* 127*  BUN 17 16  CREATININE 0.47 0.46  CALCIUM 9.2 8.2*   Liver Function Tests: Recent Labs  Lab 08/28/24 1021  AST 26  ALT 18  ALKPHOS 65  BILITOT 0.6  PROT 8.1  ALBUMIN 3.7   CBG: No results for input(s): GLUCAP in the last 168 hours.  Discharge time spent: greater than 30 minutes.  Signed: Leita Blanch, MD [facility] 08/31/2024

## 2024-08-31 NOTE — Plan of Care (Signed)
  Problem: Clinical Measurements: Goal: Ability to maintain clinical measurements within normal limits will improve Outcome: Progressing   Problem: Elimination: Goal: Will not experience complications related to bowel motility Outcome: Progressing   Problem: Nutrition: Goal: Adequate nutrition will be maintained Outcome: Progressing   Problem: Safety: Goal: Ability to remain free from injury will improve Outcome: Progressing

## 2024-08-31 NOTE — Plan of Care (Signed)

## 2024-08-31 NOTE — Discharge Instructions (Signed)
 Home Health services set up with [facility]. You will be contacted 24 to 48 hours after discharge.

## 2024-08-31 NOTE — TOC Transition Note (Signed)
 Transition of Care [facility]) - Discharge Note   Patient Details  Name: [name] MRN: [id number] Date of Birth: January 11, 1946  [facility] of Care [facility]) CM/SW Contact:  Labron Bloodgood L Ahnna Dungan, LCSW Phone Number: 08/31/2024, 11:57 AM   Clinical Narrative:     Discharge summary in. Home Health services set up with [facility]. CSW spoke with patient daughter who was agreeable to the [facility] servicing patient. No DME needs.   TOC signing off.         Patient Goals and CMS Choice            Discharge Placement                       Discharge Plan and Services Additional resources added to the After Visit Summary for                                       Social Drivers of Health (SDOH) Interventions SDOH Screenings   Food Insecurity: Patient Declined (08/30/2024)  Housing: Unknown (08/30/2024)  Transportation Needs: Patient Declined (08/30/2024)  Utilities: Not At Risk (08/30/2024)  Financial Resource Strain: Low Risk  (11/28/2023)   Received from [facility]  Social Connections: Moderately Isolated (08/30/2024)  Tobacco Use: Medium Risk (08/28/2024)     Readmission Risk Interventions     No data to display

## 2024-09-02 LAB — CULTURE, BLOOD (ROUTINE X 2)
Culture: NO GROWTH
Culture: NO GROWTH

## 2024-11-13 ENCOUNTER — Other Ambulatory Visit: Payer: Medicare PPO

## 2024-11-28 ENCOUNTER — Ambulatory Visit: Payer: Medicare PPO | Admitting: Oncology
